# Patient Record
Sex: Female | Born: 1945 | Race: White | Hispanic: No | Smoking: Never smoker
Health system: Southern US, Community
[De-identification: ages and names within clinical notes are randomized; demographics above are authoritative.]

## PROBLEM LIST (undated history)

## (undated) DIAGNOSIS — I4891 Unspecified atrial fibrillation: Secondary | ICD-10-CM

## (undated) DIAGNOSIS — R1032 Left lower quadrant pain: Secondary | ICD-10-CM

## (undated) DIAGNOSIS — R0600 Dyspnea, unspecified: Secondary | ICD-10-CM

## (undated) DIAGNOSIS — I209 Angina pectoris, unspecified: Secondary | ICD-10-CM

## (undated) DIAGNOSIS — I10 Essential (primary) hypertension: Secondary | ICD-10-CM

## (undated) DIAGNOSIS — G4730 Sleep apnea, unspecified: Secondary | ICD-10-CM

## (undated) DIAGNOSIS — C884 Extranodal marginal zone B-cell lymphoma of mucosa-associated lymphoid tissue [MALT-lymphoma]: Secondary | ICD-10-CM

## (undated) DIAGNOSIS — E119 Type 2 diabetes mellitus without complications: Secondary | ICD-10-CM

## (undated) DIAGNOSIS — I499 Cardiac arrhythmia, unspecified: Secondary | ICD-10-CM

## (undated) DIAGNOSIS — F329 Major depressive disorder, single episode, unspecified: Secondary | ICD-10-CM

## (undated) DIAGNOSIS — E039 Hypothyroidism, unspecified: Secondary | ICD-10-CM

## (undated) DIAGNOSIS — J45909 Unspecified asthma, uncomplicated: Secondary | ICD-10-CM

## (undated) DIAGNOSIS — C50919 Malignant neoplasm of unspecified site of unspecified female breast: Secondary | ICD-10-CM

## (undated) DIAGNOSIS — I2510 Atherosclerotic heart disease of native coronary artery without angina pectoris: Secondary | ICD-10-CM

## (undated) DIAGNOSIS — F32A Depression, unspecified: Secondary | ICD-10-CM

## (undated) DIAGNOSIS — D61818 Other pancytopenia: Secondary | ICD-10-CM

## (undated) DIAGNOSIS — M1990 Unspecified osteoarthritis, unspecified site: Secondary | ICD-10-CM

## (undated) DIAGNOSIS — Z923 Personal history of irradiation: Secondary | ICD-10-CM

## (undated) DIAGNOSIS — Z789 Other specified health status: Secondary | ICD-10-CM

## (undated) DIAGNOSIS — K219 Gastro-esophageal reflux disease without esophagitis: Secondary | ICD-10-CM

## (undated) HISTORY — PX: BREAST BIOPSY: SHX20

## (undated) HISTORY — PX: CARDIAC CATHETERIZATION: SHX172

## (undated) HISTORY — PX: JOINT REPLACEMENT: SHX530

## (undated) HISTORY — PX: CHOLECYSTECTOMY: SHX55

## (undated) HISTORY — PX: HAMMER TOE SURGERY: SHX385

## (undated) HISTORY — PX: DILATION AND CURETTAGE OF UTERUS: SHX78

## (undated) HISTORY — PX: OTHER SURGICAL HISTORY: SHX169

## (undated) HISTORY — DX: Extranodal marginal zone B-cell lymphoma of mucosa-associated lymphoid tissue (MALT-lymphoma): C88.4

## (undated) MED FILL — Fosaprepitant Dimeglumine For IV Infusion 150 MG (Base Eq): INTRAVENOUS | Qty: 5 | Status: AC

## (undated) MED FILL — Dexamethasone Sodium Phosphate Inj 100 MG/10ML: INTRAMUSCULAR | Qty: 1 | Status: AC

---

## 2004-07-23 ENCOUNTER — Ambulatory Visit: Payer: Self-pay | Admitting: Internal Medicine

## 2005-06-17 ENCOUNTER — Ambulatory Visit: Payer: Self-pay | Admitting: Internal Medicine

## 2005-08-26 ENCOUNTER — Ambulatory Visit: Payer: Self-pay | Admitting: Internal Medicine

## 2006-01-08 DIAGNOSIS — I2510 Atherosclerotic heart disease of native coronary artery without angina pectoris: Secondary | ICD-10-CM | POA: Insufficient documentation

## 2006-01-18 ENCOUNTER — Ambulatory Visit: Payer: Self-pay | Admitting: Cardiology

## 2006-08-01 ENCOUNTER — Other Ambulatory Visit: Payer: Self-pay

## 2006-08-01 ENCOUNTER — Ambulatory Visit: Payer: Self-pay | Admitting: Unknown Physician Specialty

## 2006-08-11 ENCOUNTER — Inpatient Hospital Stay: Payer: Self-pay | Admitting: Unknown Physician Specialty

## 2006-10-25 ENCOUNTER — Ambulatory Visit: Payer: Self-pay | Admitting: Internal Medicine

## 2007-12-26 ENCOUNTER — Ambulatory Visit: Payer: Self-pay | Admitting: Internal Medicine

## 2008-12-27 ENCOUNTER — Ambulatory Visit: Payer: Self-pay | Admitting: Internal Medicine

## 2009-06-24 ENCOUNTER — Ambulatory Visit: Payer: Self-pay | Admitting: Internal Medicine

## 2009-12-29 ENCOUNTER — Ambulatory Visit: Payer: Self-pay | Admitting: Internal Medicine

## 2010-11-03 ENCOUNTER — Encounter (INDEPENDENT_AMBULATORY_CARE_PROVIDER_SITE_OTHER): Payer: BC Managed Care – PPO | Admitting: Ophthalmology

## 2010-11-03 ENCOUNTER — Encounter (INDEPENDENT_AMBULATORY_CARE_PROVIDER_SITE_OTHER): Payer: Medicare Other | Admitting: Ophthalmology

## 2010-11-03 DIAGNOSIS — H348392 Tributary (branch) retinal vein occlusion, unspecified eye, stable: Secondary | ICD-10-CM

## 2010-11-03 DIAGNOSIS — H35039 Hypertensive retinopathy, unspecified eye: Secondary | ICD-10-CM

## 2010-11-03 DIAGNOSIS — H43819 Vitreous degeneration, unspecified eye: Secondary | ICD-10-CM

## 2011-01-26 ENCOUNTER — Encounter (INDEPENDENT_AMBULATORY_CARE_PROVIDER_SITE_OTHER): Payer: BC Managed Care – PPO | Admitting: Ophthalmology

## 2011-01-26 DIAGNOSIS — I10 Essential (primary) hypertension: Secondary | ICD-10-CM

## 2011-01-26 DIAGNOSIS — H348392 Tributary (branch) retinal vein occlusion, unspecified eye, stable: Secondary | ICD-10-CM

## 2011-01-26 DIAGNOSIS — H43819 Vitreous degeneration, unspecified eye: Secondary | ICD-10-CM

## 2011-01-26 DIAGNOSIS — H35039 Hypertensive retinopathy, unspecified eye: Secondary | ICD-10-CM

## 2011-02-05 ENCOUNTER — Ambulatory Visit: Payer: Self-pay | Admitting: Internal Medicine

## 2011-02-13 ENCOUNTER — Ambulatory Visit: Payer: Self-pay | Admitting: Unknown Physician Specialty

## 2011-02-15 ENCOUNTER — Encounter (HOSPITAL_COMMUNITY): Payer: Self-pay | Admitting: Pharmacy Technician

## 2011-02-15 DIAGNOSIS — H33009 Unspecified retinal detachment with retinal break, unspecified eye: Secondary | ICD-10-CM

## 2011-02-15 NOTE — H&P (Addendum)
Stacey Stone is an 65 y.o. female.   Chief Complaint:Loss of vision and visual field left eye HPI: Loss of vision and visual field OS  Cardiac Blockage on ASA and plavix  Surgical history, Gall Bladder surgery, Right knee surgery  No family history on file. Social History:  does not have a smoking history on file. She does not have any smokeless tobacco history on file. Her alcohol and drug histories not on file.  Allergies:  Allergies  Allergen Reactions  . Codeine Other (See Comments)    Stroke like symptoms    No current facility-administered medications on file as of .   No current outpatient prescriptions on file as of .    Review of systems otherwise negative  There were no vitals taken for this visit.  Physical exam: Mental status: oriented x3. Eyes: See eye exam scanned in for this date and encounter Ears, Nose, Throat: within normal limits Neck: Within Normal limits General: within normal limits Chest: Within normal limits Breast: deferred Heart: Within normal limits Abdomen: Within normal limits GU: deferred Extremities: within normal limits Skin: within normal limits  Assessment/Plan Rhegmatogenous Retinal detachment Left eye  Plan: To [facility] for Scleral buckle, laser treatment, gas injection        Sherrie George 02/15/2011, 5:36 PM

## 2011-02-16 ENCOUNTER — Other Ambulatory Visit: Payer: Self-pay

## 2011-02-16 ENCOUNTER — Encounter (HOSPITAL_COMMUNITY)
Admission: RE | Disposition: A | Discharge status: Home / Self Care | Payer: Self-pay | Source: Ambulatory Visit | Attending: Ophthalmology

## 2011-02-16 ENCOUNTER — Encounter (HOSPITAL_COMMUNITY): Payer: Self-pay | Admitting: Certified Registered"

## 2011-02-16 ENCOUNTER — Encounter (HOSPITAL_COMMUNITY): Payer: Self-pay | Admitting: *Deleted

## 2011-02-16 ENCOUNTER — Encounter (INDEPENDENT_AMBULATORY_CARE_PROVIDER_SITE_OTHER): Payer: BC Managed Care – PPO | Admitting: Ophthalmology

## 2011-02-16 ENCOUNTER — Ambulatory Visit (HOSPITAL_COMMUNITY)
Admission: RE | Admit: 2011-02-16 | Discharge: 2011-02-17 | Disposition: A | Discharge status: Home / Self Care | Payer: BC Managed Care – PPO | Source: Ambulatory Visit | Attending: Ophthalmology | Admitting: Ophthalmology

## 2011-02-16 ENCOUNTER — Ambulatory Visit (HOSPITAL_COMMUNITY): Payer: BC Managed Care – PPO

## 2011-02-16 ENCOUNTER — Ambulatory Visit (HOSPITAL_COMMUNITY): Payer: BC Managed Care – PPO | Admitting: Certified Registered"

## 2011-02-16 DIAGNOSIS — H33002 Unspecified retinal detachment with retinal break, left eye: Secondary | ICD-10-CM

## 2011-02-16 DIAGNOSIS — I10 Essential (primary) hypertension: Secondary | ICD-10-CM | POA: Insufficient documentation

## 2011-02-16 DIAGNOSIS — E669 Obesity, unspecified: Secondary | ICD-10-CM | POA: Insufficient documentation

## 2011-02-16 DIAGNOSIS — G4730 Sleep apnea, unspecified: Secondary | ICD-10-CM | POA: Insufficient documentation

## 2011-02-16 DIAGNOSIS — H33009 Unspecified retinal detachment with retinal break, unspecified eye: Secondary | ICD-10-CM | POA: Insufficient documentation

## 2011-02-16 DIAGNOSIS — K219 Gastro-esophageal reflux disease without esophagitis: Secondary | ICD-10-CM | POA: Insufficient documentation

## 2011-02-16 DIAGNOSIS — H35039 Hypertensive retinopathy, unspecified eye: Secondary | ICD-10-CM

## 2011-02-16 DIAGNOSIS — H348392 Tributary (branch) retinal vein occlusion, unspecified eye, stable: Secondary | ICD-10-CM

## 2011-02-16 DIAGNOSIS — E039 Hypothyroidism, unspecified: Secondary | ICD-10-CM | POA: Insufficient documentation

## 2011-02-16 DIAGNOSIS — I2510 Atherosclerotic heart disease of native coronary artery without angina pectoris: Secondary | ICD-10-CM | POA: Insufficient documentation

## 2011-02-16 HISTORY — DX: Depression, unspecified: F32.A

## 2011-02-16 HISTORY — DX: Other specified health status: Z78.9

## 2011-02-16 HISTORY — DX: Major depressive disorder, single episode, unspecified: F32.9

## 2011-02-16 HISTORY — PX: SCLERAL BUCKLE: SHX5340

## 2011-02-16 HISTORY — DX: Unspecified osteoarthritis, unspecified site: M19.90

## 2011-02-16 HISTORY — DX: Hypothyroidism, unspecified: E03.9

## 2011-02-16 HISTORY — DX: Sleep apnea, unspecified: G47.30

## 2011-02-16 HISTORY — DX: Atherosclerotic heart disease of native coronary artery without angina pectoris: I25.10

## 2011-02-16 HISTORY — DX: Essential (primary) hypertension: I10

## 2011-02-16 HISTORY — DX: Gastro-esophageal reflux disease without esophagitis: K21.9

## 2011-02-16 SURGERY — SCLERAL BUCKLE
Anesthesia: General | Laterality: Left | Wound class: Clean

## 2011-02-16 MED ORDER — BSS IO SOLN
INTRAOCULAR | Status: DC | PRN
  Administered 2011-02-16: 15 mL via INTRAOCULAR

## 2011-02-16 MED ORDER — PHENYLEPHRINE HCL 2.5 % OP SOLN
OPHTHALMIC | Status: AC
  Administered 2011-02-16: 12:00:00
  Filled 2011-02-16: qty 3

## 2011-02-16 MED ORDER — SODIUM CHLORIDE 0.9 % IJ SOLN
INTRAMUSCULAR | Status: DC | PRN
  Administered 2011-02-16: 10 mL

## 2011-02-16 MED ORDER — OXYCODONE-ACETAMINOPHEN 5-325 MG PO TABS
1.0000 | ORAL_TABLET | ORAL | Status: DC | PRN
  Administered 2011-02-17: 2 via ORAL
  Filled 2011-02-16: qty 2

## 2011-02-16 MED ORDER — ATROPINE SULFATE 1 % OP SOLN
OPHTHALMIC | Status: DC | PRN
  Administered 2011-02-16: 1 [drp] via OPHTHALMIC

## 2011-02-16 MED ORDER — LATANOPROST 0.005 % OP SOLN
1.0000 [drp] | Freq: Every day | OPHTHALMIC | Status: DC
  Filled 2011-02-16: qty 2.5

## 2011-02-16 MED ORDER — DEXAMETHASONE SODIUM PHOSPHATE 10 MG/ML IJ SOLN
INTRAMUSCULAR | Status: DC | PRN
  Administered 2011-02-16: 10 mg

## 2011-02-16 MED ORDER — BISACODYL 5 MG PO TBEC: 5.0000 mg | DELAYED_RELEASE_TABLET | Freq: Every day | ORAL | Status: DC | PRN

## 2011-02-16 MED ORDER — GLYCOPYRROLATE 0.2 MG/ML IJ SOLN
INTRAMUSCULAR | Status: DC | PRN
  Administered 2011-02-16: 0.2 mg via INTRAVENOUS
  Administered 2011-02-16: .7 mg via INTRAVENOUS

## 2011-02-16 MED ORDER — FENTANYL CITRATE 0.05 MG/ML IJ SOLN
INTRAMUSCULAR | Status: DC | PRN
  Administered 2011-02-16 (×4): 50 ug via INTRAVENOUS

## 2011-02-16 MED ORDER — GATIFLOXACIN 0.5 % OP SOLN
1.0000 [drp] | Freq: Four times a day (QID) | OPHTHALMIC | Status: DC
  Filled 2011-02-16: qty 2.5

## 2011-02-16 MED ORDER — TEMAZEPAM 15 MG PO CAPS: 15.0000 mg | ORAL_CAPSULE | Freq: Every evening | ORAL | Status: DC | PRN

## 2011-02-16 MED ORDER — SODIUM CHLORIDE 0.9 % IV SOLN
INTRAVENOUS | Status: DC
  Administered 2011-02-16 (×2): via INTRAVENOUS

## 2011-02-16 MED ORDER — CEFAZOLIN SODIUM-DEXTROSE 2-3 GM-% IV SOLR
INTRAVENOUS | Status: AC
  Administered 2011-02-16: 2 g via INTRAVENOUS
  Filled 2011-02-16: qty 50

## 2011-02-16 MED ORDER — ONDANSETRON HCL 4 MG/2ML IJ SOLN: 4.0000 mg | Freq: Four times a day (QID) | INTRAMUSCULAR | Status: DC

## 2011-02-16 MED ORDER — MAGNESIUM HYDROXIDE 400 MG/5ML PO SUSP: 15.0000 mL | Freq: Four times a day (QID) | ORAL | Status: DC | PRN

## 2011-02-16 MED ORDER — MIDAZOLAM HCL 5 MG/5ML IJ SOLN
INTRAMUSCULAR | Status: DC | PRN
  Administered 2011-02-16: 2 mg via INTRAVENOUS

## 2011-02-16 MED ORDER — GATIFLOXACIN 0.5 % OP SOLN
OPHTHALMIC | Status: AC
  Administered 2011-02-16: 1 [drp] via OPHTHALMIC
  Filled 2011-02-16: qty 2.5

## 2011-02-16 MED ORDER — BUPIVACAINE HCL 0.75 % IJ SOLN
INTRAMUSCULAR | Status: DC | PRN
  Administered 2011-02-16: 10 mL

## 2011-02-16 MED ORDER — SODIUM CHLORIDE 0.45 % IV SOLN
INTRAVENOUS | Status: DC
  Administered 2011-02-16: 17:00:00 via INTRAVENOUS

## 2011-02-16 MED ORDER — GATIFLOXACIN 0.5 % OP SOLN
1.0000 [drp] | OPHTHALMIC | Status: AC
  Administered 2011-02-16 (×3): 1 [drp] via OPHTHALMIC

## 2011-02-16 MED ORDER — FENTANYL CITRATE 0.05 MG/ML IJ SOLN
25.0000 ug | INTRAMUSCULAR | Status: DC | PRN
  Administered 2011-02-16: 25 ug via INTRAVENOUS
  Administered 2011-02-16: 50 ug via INTRAVENOUS

## 2011-02-16 MED ORDER — ROCURONIUM BROMIDE 100 MG/10ML IV SOLN
INTRAVENOUS | Status: DC | PRN
  Administered 2011-02-16: 50 mg via INTRAVENOUS

## 2011-02-16 MED ORDER — LIDOCAINE HCL (CARDIAC) 20 MG/ML IV SOLN
INTRAVENOUS | Status: DC | PRN
  Administered 2011-02-16: 80 mg via INTRAVENOUS

## 2011-02-16 MED ORDER — BRIMONIDINE TARTRATE 0.2 % OP SOLN
1.0000 [drp] | Freq: Two times a day (BID) | OPHTHALMIC | Status: DC
  Filled 2011-02-16: qty 5

## 2011-02-16 MED ORDER — POLYMYXIN B SULFATE 500000 UNITS IJ SOLR
INTRAMUSCULAR | Status: DC | PRN
  Administered 2011-02-16: 500000 [IU]

## 2011-02-16 MED ORDER — ONDANSETRON HCL 4 MG/2ML IJ SOLN
INTRAMUSCULAR | Status: DC | PRN
  Administered 2011-02-16: 4 mg via INTRAVENOUS

## 2011-02-16 MED ORDER — MORPHINE SULFATE 2 MG/ML IJ SOLN
1.0000 mg | INTRAMUSCULAR | Status: DC | PRN
  Administered 2011-02-17: 2 mg via INTRAVENOUS
  Filled 2011-02-16: qty 1

## 2011-02-16 MED ORDER — BACITRACIN-POLYMYXIN B OP OINT
TOPICAL_OINTMENT | OPHTHALMIC | Status: DC | PRN
  Administered 2011-02-16: 1 via OPHTHALMIC

## 2011-02-16 MED ORDER — PHENYLEPHRINE HCL 2.5 % OP SOLN
1.0000 [drp] | OPHTHALMIC | Status: AC
  Administered 2011-02-16 (×3): 1 [drp] via OPHTHALMIC
  Filled 2011-02-16: qty 3

## 2011-02-16 MED ORDER — NEOSTIGMINE METHYLSULFATE 1 MG/ML IJ SOLN
INTRAMUSCULAR | Status: DC | PRN
  Administered 2011-02-16: 5 mg via INTRAVENOUS

## 2011-02-16 MED ORDER — PREDNISOLONE ACETATE 1 % OP SUSP
1.0000 [drp] | Freq: Four times a day (QID) | OPHTHALMIC | Status: DC
  Filled 2011-02-16: qty 1

## 2011-02-16 MED ORDER — EPHEDRINE SULFATE 50 MG/ML IJ SOLN
INTRAMUSCULAR | Status: DC | PRN
  Administered 2011-02-16: 10 mg via INTRAVENOUS

## 2011-02-16 MED ORDER — PROMETHAZINE HCL 25 MG/ML IJ SOLN: 6.2500 mg | INTRAMUSCULAR | Status: DC | PRN

## 2011-02-16 MED ORDER — VECURONIUM BROMIDE 10 MG IV SOLR
INTRAVENOUS | Status: DC | PRN
  Administered 2011-02-16 (×4): 1 mg via INTRAVENOUS

## 2011-02-16 MED ORDER — TROPICAMIDE 1 % OP SOLN
OPHTHALMIC | Status: AC
  Administered 2011-02-16: 09:00:00
  Filled 2011-02-16: qty 3

## 2011-02-16 MED ORDER — TROPICAMIDE 1 % OP SOLN
1.0000 [drp] | OPHTHALMIC | Status: AC
  Administered 2011-02-16 (×3): 1 [drp] via OPHTHALMIC

## 2011-02-16 MED ORDER — GENTAMICIN SULFATE 40 MG/ML IJ SOLN
INTRAMUSCULAR | Status: DC | PRN
  Administered 2011-02-16: 80 mg

## 2011-02-16 MED ORDER — MUPIROCIN 2 % EX OINT
TOPICAL_OINTMENT | CUTANEOUS | Status: AC
  Filled 2011-02-16: qty 22

## 2011-02-16 MED ORDER — CYCLOPENTOLATE HCL 1 % OP SOLN
OPHTHALMIC | Status: AC
  Administered 2011-02-16: 09:00:00
  Filled 2011-02-16: qty 2

## 2011-02-16 MED ORDER — CEFAZOLIN SODIUM 1-5 GM-% IV SOLN: 1.0000 g | INTRAVENOUS | Status: DC

## 2011-02-16 MED ORDER — PROPOFOL 10 MG/ML IV EMUL
INTRAVENOUS | Status: DC | PRN
  Administered 2011-02-16: 20 mg via INTRAVENOUS
  Administered 2011-02-16: 140 mg via INTRAVENOUS

## 2011-02-16 MED ORDER — TETRACAINE HCL 0.5 % OP SOLN
1.0000 [drp] | Freq: Once | OPHTHALMIC | Status: AC
  Administered 2011-02-17: 1 [drp] via OPHTHALMIC
  Filled 2011-02-16: qty 2

## 2011-02-16 MED ORDER — HEMOSTATIC AGENTS (NO CHARGE) OPTIME
TOPICAL | Status: DC | PRN
  Administered 2011-02-16: 1

## 2011-02-16 MED ORDER — ACETAZOLAMIDE SODIUM 500 MG IJ SOLR
500.0000 mg | Freq: Once | INTRAMUSCULAR | Status: AC
  Administered 2011-02-17: 500 mg via INTRAVENOUS
  Filled 2011-02-16: qty 500

## 2011-02-16 MED ORDER — METOPROLOL SUCCINATE ER 100 MG PO TB24
100.0000 mg | ORAL_TABLET | Freq: Once | ORAL | Status: AC
  Administered 2011-02-16: 100 mg via ORAL
  Filled 2011-02-16 (×2): qty 1

## 2011-02-16 MED ORDER — ACETAMINOPHEN 325 MG PO TABS
325.0000 mg | ORAL_TABLET | ORAL | Status: DC | PRN
  Administered 2011-02-17: 650 mg via ORAL
  Filled 2011-02-16: qty 2

## 2011-02-16 MED ORDER — LACTATED RINGERS IV SOLN: INTRAVENOUS | Status: DC

## 2011-02-16 MED ORDER — CYCLOPENTOLATE HCL 1 % OP SOLN
1.0000 [drp] | OPHTHALMIC | Status: AC
  Administered 2011-02-16 (×3): 1 [drp] via OPHTHALMIC

## 2011-02-16 SURGICAL SUPPLY — 70 items
APPLICATOR DR MATTHEWS STRL (MISCELLANEOUS) ×16 IMPLANT
BLADE EYE CATARACT 19 1.4 BEAV (BLADE) IMPLANT
BLADE MVR KNIFE 19G (BLADE) IMPLANT
BLADE SURG 15 STRL LF DISP TIS (BLADE) IMPLANT
BLADE SURG 15 STRL SS (BLADE)
CANNULA ANT CHAM MAIN (OPHTHALMIC RELATED) IMPLANT
CANNULA DUAL BORE 23G (CANNULA) IMPLANT
CORDS BIPOLAR (ELECTRODE) IMPLANT
COTTONBALL LRG STERILE PKG (GAUZE/BANDAGES/DRESSINGS) ×6 IMPLANT
COVER SURGICAL LIGHT HANDLE (MISCELLANEOUS) ×2 IMPLANT
DRAPE OPHTHALMIC 77X100 STRL (CUSTOM PROCEDURE TRAY) ×2 IMPLANT
ERASER HMR WETFIELD 23G BP (MISCELLANEOUS) IMPLANT
EYE SHIELD UNIVERSAL CLEAR (GAUZE/BANDAGES/DRESSINGS) ×2 IMPLANT
FILTER BLUE MILLIPORE (MISCELLANEOUS) ×4 IMPLANT
FILTER STRAW FLUID ASPIR (MISCELLANEOUS) IMPLANT
GAS OPHTHALMIC (MISCELLANEOUS) ×2 IMPLANT
GLOVE ECLIPSE 6.5 STRL STRAW (GLOVE) ×2 IMPLANT
GLOVE SS BIOGEL STRL SZ 6.5 (GLOVE) ×1 IMPLANT
GLOVE SS BIOGEL STRL SZ 7 (GLOVE) ×1 IMPLANT
GLOVE SUPERSENSE BIOGEL SZ 6.5 (GLOVE) ×1
GLOVE SUPERSENSE BIOGEL SZ 7 (GLOVE) ×1
GLOVE SURG 8.5 LATEX PF (GLOVE) ×2 IMPLANT
GOWN STRL NON-REIN LRG LVL3 (GOWN DISPOSABLE) ×4 IMPLANT
ILLUMINATOR CHOW PICK 25GA (MISCELLANEOUS) IMPLANT
IMPL SILICONE (Ophthalmic Related) ×1 IMPLANT
IMPLANT RETINOL SPONGE SILICON (Ophthalmic Related) ×2 IMPLANT
IMPLANT SILICONE (Ophthalmic Related) ×3 IMPLANT
KIT PERFLUORON PROCEDURE 5ML (MISCELLANEOUS) IMPLANT
KIT ROOM TURNOVER OR (KITS) ×2 IMPLANT
KNIFE CRESCENT 1.75 EDGEAHEAD (BLADE) IMPLANT
KNIFE GRIESHABER SHARP 2.5MM (MISCELLANEOUS) ×6 IMPLANT
MARKER SKIN DUAL TIP RULER LAB (MISCELLANEOUS) ×2 IMPLANT
NEEDLE 18GX1X1/2 (RX/OR ONLY) (NEEDLE) ×2 IMPLANT
NEEDLE 25GX 5/8IN NON SAFETY (NEEDLE) IMPLANT
NEEDLE 27GAX1X1/2 (NEEDLE) IMPLANT
NEEDLE HYPO 30X.5 LL (NEEDLE) ×4 IMPLANT
NS IRRIG 1000ML POUR BTL (IV SOLUTION) ×2 IMPLANT
PACK VITRECTOMY CUSTOM (CUSTOM PROCEDURE TRAY) IMPLANT
PAD ARMBOARD 7.5X6 YLW CONV (MISCELLANEOUS) ×2 IMPLANT
PAD EYE OVAL STERILE LF (GAUZE/BANDAGES/DRESSINGS) ×2 IMPLANT
PAK VITRECTOMY PIK 25 GA (OPHTHALMIC RELATED) IMPLANT
PROBE DIRECTIONAL LASER (MISCELLANEOUS) IMPLANT
REPL STRA BRUSH NEEDLE (NEEDLE) IMPLANT
RESERVOIR BACK FLUSH (MISCELLANEOUS) IMPLANT
ROLLS DENTAL (MISCELLANEOUS) ×4 IMPLANT
SET FLUID INJECTOR (SET/KITS/TRAYS/PACK) IMPLANT
SET VGFI TUBING 8065808002 (SET/KITS/TRAYS/PACK) IMPLANT
SPONGE SURGIFOAM ABS GEL 12-7 (HEMOSTASIS) ×2 IMPLANT
STOPCOCK 4 WAY LG BORE MALE ST (IV SETS) IMPLANT
SUT CHROMIC 7 0 TG140 8 (SUTURE) ×2 IMPLANT
SUT ETHILON 9 0 TG140 8 (SUTURE) IMPLANT
SUT MERSILENE 4 0 RV 2 (SUTURE) ×4 IMPLANT
SUT SILK 2 0 (SUTURE) ×1
SUT SILK 2-0 18XBRD TIE 12 (SUTURE) ×1 IMPLANT
SUT SILK 4 0 RB 1 (SUTURE) ×2 IMPLANT
SUT VICRYL 7 0 TG140 8 (SUTURE) IMPLANT
SYR 20CC LL (SYRINGE) ×2 IMPLANT
SYR 50ML LL SCALE MARK (SYRINGE) IMPLANT
SYR 5ML LL (SYRINGE) IMPLANT
SYR BULB 3OZ (MISCELLANEOUS) ×2 IMPLANT
SYR TB 1ML LUER SLIP (SYRINGE) IMPLANT
SYRINGE 10CC LL (SYRINGE) IMPLANT
TAPE SURG TRANSPORE 1 IN (GAUZE/BANDAGES/DRESSINGS) ×1 IMPLANT
TAPE SURGICAL TRANSPORE 1 IN (GAUZE/BANDAGES/DRESSINGS) ×1
TIRE RTNL 2.5XGRV CNCV 9X (Ophthalmic Related) ×1 IMPLANT
TOWEL OR 17X24 6PK STRL BLUE (TOWEL DISPOSABLE) ×6 IMPLANT
TUBING ART PRESS 12 MALE/MALE (MISCELLANEOUS) IMPLANT
VITREORETINAL VISCODISSEC (MISCELLANEOUS) IMPLANT
WATER STERILE IRR 1000ML POUR (IV SOLUTION) ×2 IMPLANT
WIPE INSTRUMENT VISIWIPE 73X73 (MISCELLANEOUS) ×2 IMPLANT

## 2011-02-16 NOTE — Anesthesia Postprocedure Evaluation (Signed)
Anesthesia Post Note  Patient: Stacey Stone  Procedure(s) Performed:  SCLERAL BUCKLE - Scleral Buckle Left Eye with Headscope Laser  Anesthesia type: General  Patient location: PACU  Post pain: Pain level controlled  Post assessment: Patient's Cardiovascular Status Stable  Last Vitals:  Filed Vitals:   02/16/11 1612  BP:   Pulse: 76  Temp:   Resp: 28    Post vital signs: Reviewed and stable  Level of consciousness: sedated  Complications: No apparent anesthesia complications

## 2011-02-16 NOTE — Transfer of Care (Signed)
Immediate Anesthesia Transfer of Care Note  Patient: Stacey Stone  Procedure(s) Performed:  SCLERAL BUCKLE - Scleral Buckle Left Eye with Headscope Laser  Patient Location: PACU  Anesthesia Type: General  Level of Consciousness: lethargic and responds to stimulation  Airway & Oxygen Therapy: Patient Spontanous Breathing and Patient connected to face mask oxygen  Post-op Assessment: Report given to PACU RN  Post vital signs: Reviewed and stable  Complications: No apparent anesthesia complications

## 2011-02-16 NOTE — Anesthesia Preprocedure Evaluation (Addendum)
Anesthesia Evaluation  Patient identified by MRN, date of birth, ID band Patient awake    Reviewed: Allergy & Precautions, H&P , NPO status   Airway Mallampati: II TM Distance: >3 FB Neck ROM: Full    Dental  (+) Teeth Intact, Chipped, Dental Advisory Given, Partial Upper and Partial Lower,    Pulmonary sleep apnea ,    Pulmonary exam normal       Cardiovascular hypertension, Pt. on medications + CAD     Neuro/Psych PSYCHIATRIC DISORDERS Depression  Neuromuscular disease    GI/Hepatic hiatal hernia, GERD-  Medicated,  Endo/Other  Hypothyroidism   Renal/GU      Musculoskeletal   Abdominal (+) obese,   Peds  Hematology   Anesthesia Other Findings   Reproductive/Obstetrics                          Anesthesia Physical Anesthesia Plan  ASA: III  Anesthesia Plan: General   Post-op Pain Management:    Induction: Intravenous  Airway Management Planned: Oral ETT  Additional Equipment:   Intra-op Plan:   Post-operative Plan: Extubation in OR  Informed Consent: I have reviewed the patients History and Physical, chart, labs and discussed the procedure including the risks, benefits and alternatives for the proposed anesthesia with the patient or authorized representative who has indicated his/her understanding and acceptance.     Plan Discussed with: Surgeon and CRNA  Anesthesia Plan Comments:         Anesthesia Quick Evaluation

## 2011-02-16 NOTE — H&P (Signed)
I examined the patient and there is no change in her medical status

## 2011-02-16 NOTE — Anesthesia Procedure Notes (Addendum)
Procedure Name: Intubation Date/Time: 02/16/2011 1:35 PM Performed by: Jefm Miles E Pre-anesthesia Checklist: Timeout performed, Patient identified, Emergency Drugs available, Suction available and Patient being monitored Patient Re-evaluated:Patient Re-evaluated prior to inductionOxygen Delivery Method: Circle System Utilized Preoxygenation: Pre-oxygenation with 100% oxygen Intubation Type: IV induction Ventilation: Two handed mask ventilation required Laryngoscope Size: Mac and 4 Grade View: Grade III Tube type: Oral Number of attempts: 1 Airway Equipment and Method: stylet Placement Confirmation: ETT inserted through vocal cords under direct vision,  breath sounds checked- equal and bilateral,  positive ETCO2 and CO2 detector Secured at: 22 cm Tube secured with: Tape Dental Injury: Teeth and Oropharynx as per pre-operative assessment  Comments: Dr Gypsy Balsam performed the intubation

## 2011-02-16 NOTE — Preoperative (Signed)
Beta Blockers   Reason not to administer Beta Blockers:Not Applicable 

## 2011-02-16 NOTE — Brief Op Note (Signed)
Brief Operative note   Preoperative diagnosis:  Rhegmatogenous Retinal Detachment Left Eye Postoperative diagnosis  Rhegmatogenous Retinal Detachment Left Eye   Procedures: Scleral Buckle with Laser Photocoagulation Left eye  Surgeon:  Sherrie George, MD...  Assistant:  Rosalie Doctor SA    Anesthesia: General  Specimen: none  Estimated blood loss:  1cc  Complications: none  Patient sent to PACU in good condition  Composed by Sherrie George MD  Dictation number: [id number]

## 2011-02-16 NOTE — Op Note (Signed)
NAMEBRITTAIN, Stacey Stone          ACCOUNT NO.:  [id number]  MEDICAL RECORD NO.:  [id number]  LOCATION:  5126                         FACILITY:  [facility]  PHYSICIAN:  Beulah Gandy. Ashley Royalty, M.D. DATE OF BIRTH:  [birth year]  DATE OF PROCEDURE:  02/16/2011 DATE OF DISCHARGE:                              OPERATIVE REPORT   ADMISSION DIAGNOSIS:  Rhegmatogenous retinal detachment, left eye.  PROCEDURE:  Scleral buckle, left eye.  Retinal photocoagulation, left eye.  SURGEON:  Beulah Gandy. Ashley Royalty, MD  ASSISTANT:  Rosalie Doctor SA.  ANESTHESIA:  General.  DETAILS:  Usual prep and drape, 360-degree limbal peritomy, isolation of 4 rectus muscles on 2-0 silk, scleral dissection for 300 degrees to admit a number 279 intrascleral implant.  The buckle went from 7 o'clock to 10 o'clock.  Additional posterior dissection beneath the break at 2 o'clock.  Diathermy placed in the bed.  Two sutures per quadrant for total of 8 scleral sutures were placed in the scleral flaps. Perforation site was chosen at 5 o'clock in the posterior aspect of the bed.  The buckle was placed and trimmed.  A 279 implant was placed around the eye and a 240 band was placed around the eye and a 270 sleeve was jointed and was used to connect the band at 10 o'clock.  Perforation site chosen at 5 o'clock in the posterior aspect of the bed.  A moderate amount of clear subretinal fluid came forth in a controlled manner. Once the fluid egressed, the buckle elements were placed and the scleral flaps were closed.  The buckle was trimmed and the band was trimmed. Scleral flaps were closed and pigment came from the sclerotomy point as well.  Scleral sutures were drawn securely, knotted, and the free ends removed.  A 508G radial segment had been placed beneath the break at 2 o'clock.  Indirect ophthalmoscopy showed the retina to be lying nicely in place on the scleral buckle.  The indirect ophthalmoscope laser was moved into place, 1247  burns were placed around the retinal break and around the retinal periphery on the scleral buckle.  The power was between 300 to 100 mW 1000 microns each and 0.1 seconds each.  The buckle was adjusted and trimmed.  The band was adjusted and trimmed. The scleral sutures were knotted and the free ends removed.  The conjunctiva was reposited with 7-0 chromic suture.  Paracentesis x2 at 2 o'clock.  Obtained a closing pressure of 15 with a Barraquer tonometer. The conjunctiva was reposited with 7-0 chromic suture.  Polymyxin and gentamicin were irrigated into tenon space.  Atropine solution was applied.  Marcaine was injected around the globe for postop pain. Decadron 10 mg was injected into the lower subconjunctival space. Closing pressure was 15 with a Barraquer tonometer.  Polysporin patch and shield were placed.  The patient was awakened and taken to recovery in satisfactory condition.  COMPLICATIONS:  None.  DURATION:  2 hours.     Beulah Gandy. Ashley Royalty, M.D.     JDM/MEDQ  D:  02/16/2011  T:  02/16/2011  Job:  [id number]

## 2011-02-16 NOTE — Progress Notes (Signed)
Allyson reviewed, no new orders.

## 2011-02-17 ENCOUNTER — Encounter (HOSPITAL_COMMUNITY): Payer: Self-pay | Admitting: Ophthalmology

## 2011-02-17 MED ORDER — GATIFLOXACIN 0.5 % OP SOLN: 1.0000 [drp] | Freq: Four times a day (QID) | OPHTHALMIC | Status: DC

## 2011-02-17 MED ORDER — PREDNISOLONE ACETATE 1 % OP SUSP: 1.0000 [drp] | Freq: Four times a day (QID) | OPHTHALMIC | Status: AC

## 2011-02-17 MED ORDER — BACITRACIN-POLYMYXIN B 500-10000 UNIT/GM OP OINT
TOPICAL_OINTMENT | Freq: Three times a day (TID) | OPHTHALMIC | Status: DC
  Filled 2011-02-17: qty 3.5

## 2011-02-17 MED ORDER — BACITRACIN-POLYMYXIN B 500-10000 UNIT/GM OP OINT: TOPICAL_OINTMENT | Freq: Two times a day (BID) | OPHTHALMIC | Status: AC

## 2011-02-17 MED ORDER — HYDROCODONE-ACETAMINOPHEN 5-500 MG PO TABS: 1.0000 | ORAL_TABLET | Freq: Four times a day (QID) | ORAL | Status: AC | PRN

## 2011-02-17 NOTE — Progress Notes (Signed)
02/17/2011, 6:52 AM  Mental Status:  Awake, Alert, Oriented  Anterior segment: Cornea  Clear    Anterior Chamber Clear    Lens:   Cataract  Intra Ocular Pressure 20 mmHg with Tonopen  Vitreous: Clear   Retina:  Attached Good laser reaction   Impression: Excellent result Retina Attached  Final Diagnosis: Principal Problem:  *Rhegmatogenous retinal detachment of left eye   Plan: start post operative eye drops.  Discharge to home.  Give post operative instructions  Sherrie George 02/17/2011, 6:52 AM

## 2011-02-17 NOTE — Discharge Summary (Signed)
Not needed OWER patient see med records

## 2011-02-23 ENCOUNTER — Inpatient Hospital Stay (INDEPENDENT_AMBULATORY_CARE_PROVIDER_SITE_OTHER): Payer: BC Managed Care – PPO | Admitting: Ophthalmology

## 2011-02-23 DIAGNOSIS — H33009 Unspecified retinal detachment with retinal break, unspecified eye: Secondary | ICD-10-CM

## 2011-03-22 ENCOUNTER — Encounter (INDEPENDENT_AMBULATORY_CARE_PROVIDER_SITE_OTHER): Payer: BC Managed Care – PPO | Admitting: Ophthalmology

## 2011-03-22 DIAGNOSIS — H33009 Unspecified retinal detachment with retinal break, unspecified eye: Secondary | ICD-10-CM

## 2011-05-04 ENCOUNTER — Encounter (INDEPENDENT_AMBULATORY_CARE_PROVIDER_SITE_OTHER): Payer: BC Managed Care – PPO | Admitting: Ophthalmology

## 2011-05-04 DIAGNOSIS — H35039 Hypertensive retinopathy, unspecified eye: Secondary | ICD-10-CM

## 2011-05-04 DIAGNOSIS — I10 Essential (primary) hypertension: Secondary | ICD-10-CM

## 2011-05-04 DIAGNOSIS — H35379 Puckering of macula, unspecified eye: Secondary | ICD-10-CM

## 2011-05-04 DIAGNOSIS — H3530 Unspecified macular degeneration: Secondary | ICD-10-CM

## 2011-05-04 DIAGNOSIS — H43819 Vitreous degeneration, unspecified eye: Secondary | ICD-10-CM

## 2011-05-04 DIAGNOSIS — H348392 Tributary (branch) retinal vein occlusion, unspecified eye, stable: Secondary | ICD-10-CM

## 2011-05-04 DIAGNOSIS — H33009 Unspecified retinal detachment with retinal break, unspecified eye: Secondary | ICD-10-CM

## 2011-05-11 ENCOUNTER — Encounter (INDEPENDENT_AMBULATORY_CARE_PROVIDER_SITE_OTHER): Payer: BC Managed Care – PPO | Admitting: Ophthalmology

## 2011-05-12 ENCOUNTER — Ambulatory Visit: Payer: Self-pay | Admitting: Unknown Physician Specialty

## 2011-08-31 ENCOUNTER — Ambulatory Visit (INDEPENDENT_AMBULATORY_CARE_PROVIDER_SITE_OTHER): Payer: BC Managed Care – PPO | Admitting: Ophthalmology

## 2011-08-31 DIAGNOSIS — H43819 Vitreous degeneration, unspecified eye: Secondary | ICD-10-CM

## 2011-08-31 DIAGNOSIS — H35379 Puckering of macula, unspecified eye: Secondary | ICD-10-CM

## 2011-08-31 DIAGNOSIS — H35039 Hypertensive retinopathy, unspecified eye: Secondary | ICD-10-CM

## 2011-08-31 DIAGNOSIS — H348392 Tributary (branch) retinal vein occlusion, unspecified eye, stable: Secondary | ICD-10-CM

## 2011-08-31 DIAGNOSIS — H3530 Unspecified macular degeneration: Secondary | ICD-10-CM

## 2011-08-31 DIAGNOSIS — H33009 Unspecified retinal detachment with retinal break, unspecified eye: Secondary | ICD-10-CM

## 2012-02-08 ENCOUNTER — Ambulatory Visit: Payer: Self-pay | Admitting: Internal Medicine

## 2012-02-09 ENCOUNTER — Ambulatory Visit: Payer: Self-pay | Admitting: Internal Medicine

## 2012-08-30 ENCOUNTER — Ambulatory Visit (INDEPENDENT_AMBULATORY_CARE_PROVIDER_SITE_OTHER): Payer: BC Managed Care – PPO | Admitting: Ophthalmology

## 2012-08-30 DIAGNOSIS — H348392 Tributary (branch) retinal vein occlusion, unspecified eye, stable: Secondary | ICD-10-CM

## 2012-08-30 DIAGNOSIS — H3530 Unspecified macular degeneration: Secondary | ICD-10-CM

## 2012-08-30 DIAGNOSIS — H35379 Puckering of macula, unspecified eye: Secondary | ICD-10-CM

## 2012-08-30 DIAGNOSIS — H35039 Hypertensive retinopathy, unspecified eye: Secondary | ICD-10-CM

## 2012-08-30 DIAGNOSIS — H33009 Unspecified retinal detachment with retinal break, unspecified eye: Secondary | ICD-10-CM

## 2012-08-30 DIAGNOSIS — I10 Essential (primary) hypertension: Secondary | ICD-10-CM

## 2012-08-30 DIAGNOSIS — H35359 Cystoid macular degeneration, unspecified eye: Secondary | ICD-10-CM

## 2012-08-30 DIAGNOSIS — H43819 Vitreous degeneration, unspecified eye: Secondary | ICD-10-CM

## 2012-10-11 ENCOUNTER — Encounter (INDEPENDENT_AMBULATORY_CARE_PROVIDER_SITE_OTHER): Payer: BC Managed Care – PPO | Admitting: Ophthalmology

## 2012-10-11 DIAGNOSIS — H35359 Cystoid macular degeneration, unspecified eye: Secondary | ICD-10-CM

## 2012-10-11 DIAGNOSIS — H35379 Puckering of macula, unspecified eye: Secondary | ICD-10-CM

## 2012-10-11 DIAGNOSIS — H35039 Hypertensive retinopathy, unspecified eye: Secondary | ICD-10-CM

## 2012-10-11 DIAGNOSIS — I10 Essential (primary) hypertension: Secondary | ICD-10-CM

## 2012-10-11 DIAGNOSIS — H348392 Tributary (branch) retinal vein occlusion, unspecified eye, stable: Secondary | ICD-10-CM

## 2012-11-21 ENCOUNTER — Ambulatory Visit: Payer: Self-pay | Admitting: Obstetrics and Gynecology

## 2012-11-21 LAB — BASIC METABOLIC PANEL
Anion Gap: 7 (ref 7–16)
BUN: 12 mg/dL (ref 7–18)
Calcium, Total: 9.1 mg/dL (ref 8.5–10.1)
Co2: 27 mmol/L (ref 21–32)
EGFR (African American): >60
EGFR (Non-African Amer.): 53 — ABNORMAL LOW
Osmolality: 277 (ref 275–301)
Potassium: 3.5 mmol/L (ref 3.5–5.1)

## 2012-11-24 ENCOUNTER — Ambulatory Visit: Payer: Self-pay | Admitting: Obstetrics and Gynecology

## 2013-01-11 ENCOUNTER — Encounter (INDEPENDENT_AMBULATORY_CARE_PROVIDER_SITE_OTHER): Payer: BC Managed Care – PPO | Admitting: Ophthalmology

## 2013-01-17 ENCOUNTER — Encounter (INDEPENDENT_AMBULATORY_CARE_PROVIDER_SITE_OTHER): Payer: BC Managed Care – PPO | Admitting: Ophthalmology

## 2013-01-26 ENCOUNTER — Encounter (INDEPENDENT_AMBULATORY_CARE_PROVIDER_SITE_OTHER): Payer: BC Managed Care – PPO | Admitting: Ophthalmology

## 2013-01-26 DIAGNOSIS — H35039 Hypertensive retinopathy, unspecified eye: Secondary | ICD-10-CM

## 2013-01-26 DIAGNOSIS — H43819 Vitreous degeneration, unspecified eye: Secondary | ICD-10-CM

## 2013-01-26 DIAGNOSIS — I10 Essential (primary) hypertension: Secondary | ICD-10-CM

## 2013-01-26 DIAGNOSIS — H348392 Tributary (branch) retinal vein occlusion, unspecified eye, stable: Secondary | ICD-10-CM

## 2013-01-26 DIAGNOSIS — H35359 Cystoid macular degeneration, unspecified eye: Secondary | ICD-10-CM

## 2013-01-26 DIAGNOSIS — H33009 Unspecified retinal detachment with retinal break, unspecified eye: Secondary | ICD-10-CM

## 2013-02-08 ENCOUNTER — Ambulatory Visit: Payer: Self-pay | Admitting: Internal Medicine

## 2013-02-13 ENCOUNTER — Ambulatory Visit: Payer: Self-pay | Admitting: Internal Medicine

## 2013-03-01 ENCOUNTER — Ambulatory Visit: Payer: Self-pay | Admitting: Gastroenterology

## 2013-03-02 LAB — PATHOLOGY REPORT

## 2013-05-31 ENCOUNTER — Ambulatory Visit (INDEPENDENT_AMBULATORY_CARE_PROVIDER_SITE_OTHER): Payer: BC Managed Care – PPO | Admitting: Ophthalmology

## 2013-05-31 DIAGNOSIS — H348392 Tributary (branch) retinal vein occlusion, unspecified eye, stable: Secondary | ICD-10-CM

## 2013-05-31 DIAGNOSIS — H35359 Cystoid macular degeneration, unspecified eye: Secondary | ICD-10-CM

## 2013-05-31 DIAGNOSIS — I10 Essential (primary) hypertension: Secondary | ICD-10-CM

## 2013-05-31 DIAGNOSIS — H35039 Hypertensive retinopathy, unspecified eye: Secondary | ICD-10-CM

## 2013-05-31 DIAGNOSIS — H43819 Vitreous degeneration, unspecified eye: Secondary | ICD-10-CM

## 2013-05-31 DIAGNOSIS — H33009 Unspecified retinal detachment with retinal break, unspecified eye: Secondary | ICD-10-CM

## 2013-06-16 DIAGNOSIS — I208 Other forms of angina pectoris: Secondary | ICD-10-CM | POA: Insufficient documentation

## 2013-06-16 DIAGNOSIS — E039 Hypothyroidism, unspecified: Secondary | ICD-10-CM | POA: Insufficient documentation

## 2013-06-16 DIAGNOSIS — I2089 Other forms of angina pectoris: Secondary | ICD-10-CM | POA: Insufficient documentation

## 2013-06-16 DIAGNOSIS — E7800 Pure hypercholesterolemia, unspecified: Secondary | ICD-10-CM | POA: Insufficient documentation

## 2013-06-16 DIAGNOSIS — I10 Essential (primary) hypertension: Secondary | ICD-10-CM | POA: Insufficient documentation

## 2013-08-15 ENCOUNTER — Ambulatory Visit: Payer: Self-pay | Admitting: Internal Medicine

## 2013-10-03 ENCOUNTER — Ambulatory Visit: Payer: Self-pay | Admitting: Unknown Physician Specialty

## 2013-12-05 ENCOUNTER — Ambulatory Visit (INDEPENDENT_AMBULATORY_CARE_PROVIDER_SITE_OTHER): Payer: BC Managed Care – PPO | Admitting: Ophthalmology

## 2013-12-05 DIAGNOSIS — H33009 Unspecified retinal detachment with retinal break, unspecified eye: Secondary | ICD-10-CM

## 2013-12-05 DIAGNOSIS — H35379 Puckering of macula, unspecified eye: Secondary | ICD-10-CM

## 2013-12-05 DIAGNOSIS — H348392 Tributary (branch) retinal vein occlusion, unspecified eye, stable: Secondary | ICD-10-CM

## 2013-12-05 DIAGNOSIS — H35039 Hypertensive retinopathy, unspecified eye: Secondary | ICD-10-CM

## 2013-12-05 DIAGNOSIS — I10 Essential (primary) hypertension: Secondary | ICD-10-CM

## 2013-12-05 DIAGNOSIS — H35359 Cystoid macular degeneration, unspecified eye: Secondary | ICD-10-CM

## 2013-12-05 DIAGNOSIS — H43819 Vitreous degeneration, unspecified eye: Secondary | ICD-10-CM

## 2014-01-02 DIAGNOSIS — Z9989 Dependence on other enabling machines and devices: Secondary | ICD-10-CM

## 2014-01-02 DIAGNOSIS — G4733 Obstructive sleep apnea (adult) (pediatric): Secondary | ICD-10-CM | POA: Insufficient documentation

## 2014-01-23 ENCOUNTER — Ambulatory Visit: Payer: Self-pay | Admitting: Unknown Physician Specialty

## 2014-01-23 LAB — CBC WITH DIFFERENTIAL/PLATELET
BASOS ABS: 0.1 10*3/uL (ref 0.0–0.1)
BASOS PCT: 0.9 %
Eosinophil #: 0.3 10*3/uL (ref 0.0–0.7)
Eosinophil %: 3.3 %
HCT: 39.8 % (ref 35.0–47.0)
HGB: 13 g/dL (ref 12.0–16.0)
LYMPHS PCT: 31.2 %
Lymphocyte #: 2.5 10*3/uL (ref 1.0–3.6)
MCH: 26.9 pg (ref 26.0–34.0)
MCHC: 32.8 g/dL (ref 32.0–36.0)
MCV: 82 fL (ref 80–100)
MONO ABS: 0.6 x10 3/mm (ref 0.2–0.9)
MONOS PCT: 7 %
NEUTROS ABS: 4.6 10*3/uL (ref 1.4–6.5)
NEUTROS PCT: 57.6 %
Platelet: 228 10*3/uL (ref 150–440)
RBC: 4.84 10*6/uL (ref 3.80–5.20)
RDW: 15.5 % — AB (ref 11.5–14.5)
WBC: 8 10*3/uL (ref 3.6–11.0)

## 2014-01-23 LAB — APTT: Activated PTT: 25.8 secs (ref 23.6–35.9)

## 2014-01-23 LAB — PROTIME-INR
INR: 1
Prothrombin Time: 12.6 secs (ref 11.5–14.7)

## 2014-02-05 ENCOUNTER — Ambulatory Visit: Payer: Self-pay | Admitting: Internal Medicine

## 2014-02-05 LAB — CBC WITH DIFFERENTIAL/PLATELET
BASOS PCT: 0.4 %
Basophil #: 0 10*3/uL (ref 0.0–0.1)
EOS ABS: 0.1 10*3/uL (ref 0.0–0.7)
EOS PCT: 0.5 %
HCT: 39.6 % (ref 35.0–47.0)
HGB: 12.8 g/dL (ref 12.0–16.0)
LYMPHS ABS: 1 10*3/uL (ref 1.0–3.6)
Lymphocyte %: 8.6 %
MCH: 26.6 pg (ref 26.0–34.0)
MCHC: 32.4 g/dL (ref 32.0–36.0)
MCV: 82 fL (ref 80–100)
Monocyte #: 0.2 x10 3/mm (ref 0.2–0.9)
Monocyte %: 2 %
NEUTROS ABS: 10.3 10*3/uL — AB (ref 1.4–6.5)
Neutrophil %: 88.5 %
PLATELETS: 207 10*3/uL (ref 150–440)
RBC: 4.81 10*6/uL (ref 3.80–5.20)
RDW: 15.5 % — AB (ref 11.5–14.5)
WBC: 11.7 10*3/uL — AB (ref 3.6–11.0)

## 2014-02-05 LAB — TROPONIN I: Troponin-I: <0.02 ng/mL

## 2014-02-05 LAB — BASIC METABOLIC PANEL
Anion Gap: 7 (ref 7–16)
BUN: 12 mg/dL (ref 7–18)
CO2: 27 mmol/L (ref 21–32)
CREATININE: 1.3 mg/dL (ref 0.60–1.30)
Calcium, Total: 8.9 mg/dL (ref 8.5–10.1)
Chloride: 104 mmol/L (ref 98–107)
EGFR (African American): 52 — ABNORMAL LOW
GFR CALC NON AF AMER: 43 — AB
Glucose: 141 mg/dL — ABNORMAL HIGH (ref 65–99)
OSMOLALITY: 278 (ref 275–301)
Potassium: 3.7 mmol/L (ref 3.5–5.1)
Sodium: 138 mmol/L (ref 136–145)

## 2014-02-05 LAB — CK TOTAL AND CKMB (NOT AT ARMC): CK, TOTAL: 45 U/L

## 2014-02-05 LAB — D-DIMER(ARMC): D-DIMER: 782 ng/mL

## 2014-02-06 LAB — CBC WITH DIFFERENTIAL/PLATELET
BASOS ABS: 0 10*3/uL (ref 0.0–0.1)
BASOS PCT: 0.1 %
Eosinophil #: 0 10*3/uL (ref 0.0–0.7)
Eosinophil %: 0 %
HCT: 36.9 % (ref 35.0–47.0)
HGB: 11.8 g/dL — ABNORMAL LOW (ref 12.0–16.0)
LYMPHS PCT: 11.8 %
Lymphocyte #: 1.2 10*3/uL (ref 1.0–3.6)
MCH: 26.5 pg (ref 26.0–34.0)
MCHC: 31.8 g/dL — AB (ref 32.0–36.0)
MCV: 83 fL (ref 80–100)
MONO ABS: 0.4 x10 3/mm (ref 0.2–0.9)
MONOS PCT: 4 %
NEUTROS PCT: 84.1 %
Neutrophil #: 8.5 10*3/uL — ABNORMAL HIGH (ref 1.4–6.5)
PLATELETS: 211 10*3/uL (ref 150–440)
RBC: 4.44 10*6/uL (ref 3.80–5.20)
RDW: 15.5 % — AB (ref 11.5–14.5)
WBC: 10.1 10*3/uL (ref 3.6–11.0)

## 2014-02-11 ENCOUNTER — Ambulatory Visit: Payer: Self-pay | Admitting: Internal Medicine

## 2014-03-05 ENCOUNTER — Ambulatory Visit: Payer: Self-pay | Admitting: Oncology

## 2014-03-05 LAB — COMPREHENSIVE METABOLIC PANEL
Albumin: 3.8 g/dL (ref 3.4–5.0)
Alkaline Phosphatase: 183 U/L — ABNORMAL HIGH
Anion Gap: 10 (ref 7–16)
BUN: 11 mg/dL (ref 7–18)
Bilirubin,Total: 0.4 mg/dL (ref 0.2–1.0)
CHLORIDE: 103 mmol/L (ref 98–107)
Calcium, Total: 9.1 mg/dL (ref 8.5–10.1)
Co2: 30 mmol/L (ref 21–32)
Creatinine: 1.11 mg/dL (ref 0.60–1.30)
GFR CALC NON AF AMER: 52 — AB
Glucose: 111 mg/dL — ABNORMAL HIGH (ref 65–99)
OSMOLALITY: 285 (ref 275–301)
Potassium: 3.7 mmol/L (ref 3.5–5.1)
SGOT(AST): 23 U/L (ref 15–37)
SGPT (ALT): 32 U/L
Sodium: 143 mmol/L (ref 136–145)
Total Protein: 7.8 g/dL (ref 6.4–8.2)

## 2014-03-05 LAB — CBC CANCER CENTER
BASOS ABS: 0.1 x10 3/mm (ref 0.0–0.1)
BASOS PCT: 0.9 %
EOS ABS: 0.2 x10 3/mm (ref 0.0–0.7)
Eosinophil %: 2.9 %
HCT: 41.3 % (ref 35.0–47.0)
HGB: 13.4 g/dL (ref 12.0–16.0)
LYMPHS ABS: 2.3 x10 3/mm (ref 1.0–3.6)
Lymphocyte %: 30.7 %
MCH: 26.2 pg (ref 26.0–34.0)
MCHC: 32.5 g/dL (ref 32.0–36.0)
MCV: 81 fL (ref 80–100)
MONOS PCT: 6.5 %
Monocyte #: 0.5 x10 3/mm (ref 0.2–0.9)
NEUTROS PCT: 59 %
Neutrophil #: 4.5 x10 3/mm (ref 1.4–6.5)
Platelet: 228 x10 3/mm (ref 150–440)
RBC: 5.13 10*6/uL (ref 3.80–5.20)
RDW: 16.1 % — ABNORMAL HIGH (ref 11.5–14.5)
WBC: 7.6 x10 3/mm (ref 3.6–11.0)

## 2014-03-05 LAB — SEDIMENTATION RATE: ERYTHROCYTE SED RATE: 28 mm/h (ref 0–30)

## 2014-03-05 LAB — LACTATE DEHYDROGENASE: LDH: 183 U/L (ref 81–246)

## 2014-03-11 ENCOUNTER — Ambulatory Visit: Payer: Self-pay | Admitting: Oncology

## 2014-03-22 ENCOUNTER — Ambulatory Visit: Payer: Self-pay | Admitting: Oncology

## 2014-03-25 ENCOUNTER — Ambulatory Visit: Payer: Self-pay | Admitting: Oncology

## 2014-03-25 LAB — CBC WITH DIFFERENTIAL/PLATELET
Basophil: 1 %
COMMENT - H1-COM1: NORMAL
EOS PCT: 2 %
HCT: 40 % (ref 35.0–47.0)
HGB: 12.9 g/dL (ref 12.0–16.0)
Lymphocytes: 37 %
MCH: 26.4 pg (ref 26.0–34.0)
MCHC: 32.2 g/dL (ref 32.0–36.0)
MCV: 82 fL (ref 80–100)
MONOS PCT: 5 %
PLATELETS: 201 10*3/uL (ref 150–440)
RBC: 4.88 10*6/uL (ref 3.80–5.20)
RDW: 15.5 % — AB (ref 11.5–14.5)
Segmented Neutrophils: 50 %
Variant Lymphocyte - H1-Rlymph: 5 %
WBC: 8.3 10*3/uL (ref 3.6–11.0)

## 2014-04-22 ENCOUNTER — Ambulatory Visit: Payer: Self-pay | Admitting: Oncology

## 2014-06-05 ENCOUNTER — Ambulatory Visit (INDEPENDENT_AMBULATORY_CARE_PROVIDER_SITE_OTHER): Payer: BLUE CROSS/BLUE SHIELD | Admitting: Ophthalmology

## 2014-06-05 DIAGNOSIS — H34821 Venous engorgement, right eye: Secondary | ICD-10-CM

## 2014-06-05 DIAGNOSIS — H35033 Hypertensive retinopathy, bilateral: Secondary | ICD-10-CM

## 2014-06-05 DIAGNOSIS — H35372 Puckering of macula, left eye: Secondary | ICD-10-CM

## 2014-06-05 DIAGNOSIS — I10 Essential (primary) hypertension: Secondary | ICD-10-CM

## 2014-06-05 DIAGNOSIS — H43813 Vitreous degeneration, bilateral: Secondary | ICD-10-CM | POA: Diagnosis not present

## 2014-07-03 ENCOUNTER — Ambulatory Visit
Admit: 2014-07-03 | Disposition: A | Discharge status: Home / Self Care | Payer: Self-pay | Attending: Oncology | Admitting: Oncology

## 2014-07-03 LAB — COMPREHENSIVE METABOLIC PANEL
ANION GAP: 7 (ref 7–16)
Albumin: 4 g/dL
Alkaline Phosphatase: 174 U/L — ABNORMAL HIGH
BUN: 16 mg/dL
Bilirubin,Total: 0.6 mg/dL
CALCIUM: 9 mg/dL
CHLORIDE: 103 mmol/L
CO2: 28 mmol/L
Creatinine: 1 mg/dL
GFR CALC NON AF AMER: 57 — AB
GLUCOSE: 101 mg/dL — AB
Potassium: 3.9 mmol/L
SGOT(AST): 29 U/L
SGPT (ALT): 34 U/L
SODIUM: 138 mmol/L
Total Protein: 7.4 g/dL

## 2014-07-03 LAB — CBC CANCER CENTER
Basophil #: 0.1 x10 3/mm (ref 0.0–0.1)
Basophil %: 1.1 %
EOS ABS: 0.3 x10 3/mm (ref 0.0–0.7)
EOS PCT: 3.3 %
HCT: 40.8 % (ref 35.0–47.0)
HGB: 13.3 g/dL (ref 12.0–16.0)
LYMPHS ABS: 2.8 x10 3/mm (ref 1.0–3.6)
Lymphocyte %: 34.4 %
MCH: 25.9 pg — ABNORMAL LOW (ref 26.0–34.0)
MCHC: 32.5 g/dL (ref 32.0–36.0)
MCV: 80 fL (ref 80–100)
MONOS PCT: 6.2 %
Monocyte #: 0.5 x10 3/mm (ref 0.2–0.9)
Neutrophil #: 4.4 x10 3/mm (ref 1.4–6.5)
Neutrophil %: 55 %
PLATELETS: 240 x10 3/mm (ref 150–440)
RBC: 5.13 10*6/uL (ref 3.80–5.20)
RDW: 15.3 % — ABNORMAL HIGH (ref 11.5–14.5)
WBC: 8 x10 3/mm (ref 3.6–11.0)

## 2014-07-03 LAB — LACTATE DEHYDROGENASE: LDH: 147 U/L

## 2014-07-05 DIAGNOSIS — C884 Extranodal marginal zone b-cell lymphoma of mucosa-associated lymphoid tissue (malt-lymphoma) not having achieved remission: Secondary | ICD-10-CM

## 2014-07-05 HISTORY — DX: Extranodal marginal zone B-cell lymphoma of mucosa-associated lymphoid tissue (MALT-lymphoma): C88.4

## 2014-07-05 HISTORY — DX: Extranodal marginal zone b-cell lymphoma of mucosa-associated lymphoid tissue (malt-lymphoma) not having achieved remission: C88.40

## 2014-07-12 NOTE — Op Note (Signed)
PATIENT NAME:  Stacey Stone, Stacey Stone MR#:  [id number] DATE OF BIRTH:  Aug 29, 1945  DATE OF PROCEDURE:  11/24/2012  PREOPERATIVE DIAGNOSES:  1.  Postmenopausal bleeding.  2.  Submucosal endometrial fibroid.   POSTOPERATIVE DIAGNOSES: 1.  Postmenopausal bleeding.  2.  Endometrial submucosal fibroid.  3.  Endometrial polyp.   PROCEDURE PERFORMED:  Hysteroscopic resection of submucosal fibroid with the MyoSure resector.   SURGEON: Laverta Baltimore, M.D.   ANESTHESIA: LMA.   INDICATIONS: A 69 year old female with postmenopausal bleeding, saline infusion sonohysterography reveals a 2 x 2 x 1.6 x 2.4 cm submucosal fibroid.   DESCRIPTION OF PROCEDURE: After adequate LMA anesthesia, the patient was placed in the dorsal supine position, the legs in the candy cane stirrups. SCDs in place during the procedure. The patient received 2 grams IV cefoxitin for the case. The patient's lower abdomen and vagina were prepped with Betadine. Straight catheterization of the bladder yielded 50 mL of urine. A weighted speculum was placed in the posterior vaginal vault, and the anterior cervix was grasped with a single-tooth tenaculum. The cervix was then dilated to #15 Hanks dilator. The MyoSure rep was present during the procedure for instruction. The hysteroscope was advanced into the endometrial cavity without difficulty. A large posterior fibroid was identified. At that point, the MyoSure resector was brought up to the operative field and advanced into the endometrial cavity. Normal saline was used as the distending medium, and the resection of the fibroid took place 75 seconds with good effect of removal of the fibroid. There was a small polyp noted at the fundal area. This was removed also with the resector. Good hemostasis noted. The procedure was terminated.    ESTIMATED BLOOD LOSS: Minimal.    INTRAOPERATIVE FLUIDS: 700 mL.   NET DEFICIT NORMAL SALINE FROM THE DOLPHIN: 90 mL.   The patient tolerated the  procedure well. There were no complications. The patient was taken to the recovery room in good condition.     ____________________________ Boykin Nearing, MD tjs:dmm D: 11/24/2012 09:38:00 ET T: 11/24/2012 09:59:21 ET JOB#: [id number]  cc: Boykin Nearing, MD, <Dictator> Boykin Nearing MD ELECTRONICALLY SIGNED 12/03/2012 9:48

## 2014-07-13 NOTE — Discharge Summary (Signed)
PATIENT NAME:  Stacey Stone, Stacey Stone MR#:  [id number] DATE OF BIRTH:  1945/09/26  DATE OF ADMISSION:  02/05/2014 DATE OF DISCHARGE:  02/06/2014  DISCHARGE DIAGNOSES:  1.  Postoperative hypoxia.  2.  Sleep apnea, noncompliant with CPAP.  3.  Hypothyroid.  4.  Coronary artery disease.  5.  Hyperlipidemia.  6.  Hypertension.   DISCHARGE MEDICATIONS:  Synthroid 150 mcg daily, Plavix 75 mg daily, simvastatin 40 mg at bedtime, aspirin 81 mg daily, Advair 100/50 b.i.d., potassium 20 mEq 1.5 tabs daily, torsemide 20 mg 1-1/2 tabs daily, Cardizem CD 240 mg daily, Toprol-XL 100 mg daily, sertraline 50 mg daily, Celebrex 200 mg daily, AcipHex 20 mg daily, vitamin C 500 mg daily, Percocet 5/325 one tab t.i.d. p.r.n. pain, Augmentin 875 mg b.i.d.   REASON FOR ADMISSION: A 69 year old female postoperative right submandibular mass surgery, experienced hypoxia. Please see H and P for HPI,  past medical history, and physical exam.   HOSPITAL COURSE: The patient was admitted. Chest x-ray negative. Chest CT showed a nondescript right lower lobe 4 mm nodule, otherwise no pulmonary embolism. Laboratories were stable. She has not been compliant with her CPAP. She promises me she will at this point and she will go off soda and sweet tea to lose weight. Will need followup CPAP testing and ultimately may need nocturnal O2. She is 92% saturation on room air this morning and asymptomatic. Surgery overall went well. She will follow up with Dr. Tami Ribas in 1 week and me in 2 weeks.     ____________________________ Rusty Aus, MD mfm:bu D: 02/06/2014 08:01:51 ET T: 02/06/2014 12:53:33 ET JOB#: [id number]  cc: Rusty Aus, MD, <Dictator> Rusty Aus MD ELECTRONICALLY SIGNED 02/07/2014 8:18

## 2014-07-13 NOTE — H&P (Signed)
PATIENT NAME:  Stacey Stone, RAMSARAN MR#:  [id number] DATE OF BIRTH:  [birth year]  DATE OF ADMISSION:  02/05/2014  PRIMARY CARE PHYSICIAN:  Dr. Emily Filbert.   EARS, NOSE, AND THROAT PHYSICIAN:  Dr. Tami Ribas.    CHIEF COMPLAINT: Hypoxia.   HISTORY OF PRESENT ILLNESS: The patient is a 69 year old female with past medical history of hypothyroidism, coronary artery, hyperlipidemia, obstructive sleep apnea, hypertension, and other medical problems, was recently diagnosed with a knot on the right side of her mandible. She was evaluated by Dr. Tami Ribas and the patient is scheduled today to get submandibular gland excision on the right side. The patient came and got the procedure done today and while she was in the recovery room the patient was persistently hypoxic. Initially the patient was placed on CPAP and subsequently it was weaned off to 4 liters of oxygen. The patient admits that she has obstructive sleep apnea, but not using CPAP as she could not tolerate the mask. She sees Dr. Raul Del as an outpatient. The patient was on 2 liters of oxygen in the past, but recently as her oxygen saturations were good it was discontinued. Currently the patient is not on any home oxygen. The patient being persistently hypoxic, hospitalist team is consulted. During my examination the patient denies any chest pain or shortness of breath. She was lethargic but arousable and answering all questions appropriately. ABGs and initial chest x-ray were normal. Troponin was negative. D-dimer was elevated. As we were unable to wean her off the oxygen and also d-dimer being high, we have decided to admit the patient under observation status. At the time of admission the patient was saturating 93%-94% on 1 liter of oxygen. Without oxygen the patient is immediately desaturating to 78% on room air. Denies any history of COPD.    PAST MEDICAL HISTORY: Hypothyroidism, GERD, coronary artery disease nonobstructing, hyperlipidemia, obstructive  sleep apnea, hypertension.   PAST SURGICAL HISTORY: Hysterectomy in the past, partial right knee surgery, status post submandibular gland excision on the right side today.   ALLERGIES: CODEINE.   PSYCHOSOCIAL HISTORY: Lives at home with son. No history of smoking, alcohol, or illicit drug usage.   FAMILY HISTORY: Mom deceased at age 100 with a history of breast cancer.   HOME MEDICATIONS: Vitamin C 500 mg once daily, torsemide 20 mg po once daily, simvastatin 40 mg p.o. at bedtime, Zoloft 50 p.o. once daily, rabeprazole 20 mg p.o. once daily, potassium chloride 30 mg p.o. once daily, Plavix 75 mg p.o. once daily, Percocet 5/325 1-2 tablets p.o. every 4-6 hours as needed for pain, metoprolol succinate 100 mg 1 tablet p.o. once daily, levothyroxine 150 mcg p.o. once daily, diltiazem hydrochloride 240 mg 1 capsule p.o. once daily, Celebrex 200 mg 1 capsule p.o. once daily, Augmentin 875 mg p.o. q. 12 hours which was started by Dr. Tami Ribas after surgery, aspirin 81 mg p.o. once daily, Advair 100/50 one puff inhalation 2 times a day, Tylenol 2 tablets p.o. every 6 hours as needed.   REVIEW OF SYSTEMS:  CONSTITUTIONAL: Denies any fever or fatigue.  EYES: Denies blurry vision, double vision.  EARS, NOSE, AND THROAT: Denies epistaxis, discharge.  RESPIRATORY:  Denies cough, COPD.   CARDIOVASCULAR: No chest pain, palpitations, dizziness.  GASTROINTESTINAL: Denies nausea, vomiting, diarrhea, abdominal pain.  GENITOURINARY: No dysuria, hematuria.  GYNECOLOGIC AND BREASTS: Denies any breast mass or vaginal discharge.  ENDOCRINE: Denies polyuria, nocturia. Has history of hypothyroidism.  HEMATOLOGIC AND LYMPHATIC: No anemia, easy bruising or bleeding, but has  submandibular salivary gland enlargement.   INTEGUMENT:  No acne, rash, lesions.   MUSCULOSKELETAL: No joint pain in the neck and back.   PSYCHIATRIC:  No ADD or OCD.    PHYSICAL EXAMINATION:  VITAL SIGNS: Temperature 98.2, pulse is 67,  respirations 18, blood pressure 108/67, pulse oximetry 98% on 2 liters.  GENERAL APPEARANCE: Not in acute distress. Moderately built and obese.  HEENT: Normocephalic, atraumatic. Pupils are equally reacting to light and accommodation. The right submandibular area with a clean incision.   NECK: Supple, but range of motion is limited because of the pain on the right submandibular area.  LUNGS: Clear to auscultation bilaterally. No accessory muscle use. No anterior chest wall tenderness on palpation.  CARDIAC:  S1, S2, regular rate and rhythm. No murmurs.  ABDOMEN:  Soft, obese. Bowel sounds are positive in all 4 quadrants. Nontender, nondistended. No hepatosplenomegaly. No masses felt.  NEUROLOGIC:  Lethargic but arousable and oriented x 3. Answering questions appropriately. Motor and sensory are intact. Reflexes are 2 +.  EXTREMITIES: No edema. No cyanosis. No clubbing.  SKIN: Warm to touch. Normal turgor. No rashes. No lesions.  MUSCULOSKELETAL: No joint effusion, tenderness, erythema.   PSYCHIATRIC:  Flat mood and affect.   LABORATORIES AND IMAGING STUDIES:  CK total 45, CPK-MB less than 0.5, troponin less than 0.02. WBC 11.7, hemoglobin, hematocrit, platelet count are normal. D-dimer is elevated at 782. Blood gas, pH 7.35,  pO2 109, FiO2 36%, bicarbonate is 27.6.  On BMP GFR is at 43. Serum osmolality, calcium are normal. Glucose 141, rest of the BMP is normal.    EKG, sinus bradycardia at 58, first degree AV block with prolonged PR interval, nonspecific ST-T wave changes, low voltage.  Portable chest x-ray, no evidence of pneumonia or CHF, minimal subsegmental atelectasis at the left lung base is present   ASSESSMENT AND PLAN: A 69 year old obese female with history of obstructive sleep apnea who came into outpatient procedures and had right submandibular salivary gland excision by Dr. Tami Ribas and patient was persistently hypoxemic in the recovery room. Hospitalist team is consulted.    1.   Persistent hypoxia probably from Pickwickian syndrome with a component of obstructive sleep apnea. With D-dimer being elevated we will rule out pulmonary embolism. We will admit her to observation status with telemetry. We will check continuous pulse oximetry. The patient had a history of hypoxemia and was on home oxygen in the past, which was discontinued and if she is persistently hypoxic she might qualify for home oxygen at this time. A CTA chest is ordered to rule out pulmonary embolism.  2.  Submandibular salivary gland excision.  We will provide Percocet as needed and Augmentin as recommended by Dr. Tami Ribas. Continue wound care and outpatient follow up with Dr. Tami Ribas as requested by him. Pathology report of the salivary gland needs to be followed by primary care physician and Dr. Tami Ribas.  3.  History of hypothyroidism. Continue Synthroid.  4.  Hyperlipidemia. Resume her home medication.   5.  Obstructive sleep apnea. We will provide CPAP at bedtime.  6.  Hypertension. Resume home medications with holding parameters.  7.  Gastroesophageal reflux disease.  We will provide Pepcid.  8.  We will check CBC in a.m.  9.  We will provide GI and DVT prophylaxis.   CODE STATUS: She is full code. Daughter is the medical power of attorney.   The patient will be transferred to Dr. Emily Filbert. The plan of care was discussed with the  patient, she is aware of the plan.    TOTAL TIME SPENT: 50 minutes.      ____________________________ Nicholes Mango, MD ag:bu D: 02/05/2014 15:25:00 ET T: 02/05/2014 15:59:47 ET JOB#: [id number]  cc: Nicholes Mango, MD, <Dictator> Nicholes Mango MD ELECTRONICALLY SIGNED 02/15/2014 17:58

## 2014-07-13 NOTE — Op Note (Signed)
PATIENT NAME:  Stacey Stone, STANDRE MR#:  [id number] DATE OF BIRTH:  October 17, 1945  DATE OF PROCEDURE:  02/05/2014  PREOPERATIVE DIAGNOSIS: Right submandibular gland mass.  POSTOPERATIVE DIAGNOSIS: Right submandibular gland mass.   ATTENDING SURGEON:  Roena Malady, MD  ASSISTANT SURGEON:  Margaretha Sheffield, MD.   OPERATIONS PERFORMED:  1. Excision of right submandibular gland.  2. Facial nerve monitoring approximately 1 hour.   OPERATIVE FINDINGS: A firm, sclerotic approximately 2 x 3 cm submandibular gland.   DESCRIPTION OF PROCEDURE: Ms. Bilello was identified in the holding area and taken to the operating room and placed in the supine position. After general endotracheal anesthesia, the table was turned 90 degrees.  The facial nerve monitoring device was applied to the lower lip to monitor the marginal branch of the facial nerve. This remained intact in all throughout the procedure.   An incision line was marked approximately 2 fingerbreadths below the angle of mandible overlying the submandibular gland. A local anesthetic of 1% lidocaine with 1:100,000 epinephrine was used to inject all along this incision mark. A total of 4 mL was used. The neck and face were then prepped and draped sterilely. A 15 blade was used to incise down to and through the platysma muscle. Hemostasis was achieved using the Bovie and bipolar cauteries.  Using the facial monitor stimulator,  the marginal branch of the facial nerve was identified and retracted superiorly and posteriorly and remained intact throughout the procedure. The capsule of the submandibular gland was easily identified. Dissection proceeded around the gland adjacent to the capsule. There were several small feeding vessels which were divided using the Harmonic scalpel. On the right posterior aspect there was an arterial feed into this which was divided and suture ligated using 2-0 silk suture. As the gland was retracted medially, the fibrous  portions of the capsule were divided from the surrounding tissue. The dissection then proceeded anteriorly and medially. The mylohyoid muscle was identified and retracted anteriorly. The submandibular duct, as it went beneath the mylohyoid muscle, was divided using the Harmonic scalpel.   As the gland was pulled inferiorly, the lingual nerve was identified. The submandibular ganglion, which connected the gland to the lingual nerve, was divided using the Harmonic scalpel. This allowed the gland to be removed.  Any other small feeding vessels were divided using the Harmonic scalpel. Medial and inferior to the nerve, there was 1 other small piece of gland. This was gently dissected away from the nerve and removed using the Harmonic scalpel.   With the gland removed, the wound was then copiously irrigated with saline. Any small bleeding points were cauterized using the microbipolar. A #7 TLS drain was brought out of wound inferiorly. Surgicel was then placed within the submandibular bed. With no active bleeding, the  platysma muscle was closed using interrupted 4-0 Vicryl. The subcutaneous tissues were closed using 4-0 Vicryl, and the skin was closed using sterile skin glue. The drain was then affixed to the skin using a Tegaderm. The patient was then returned to anesthesia where she was awakened in the operating room and taken to the recovery room in stable condition.   CULTURES: None.   SPECIMENS: Right submandibular gland.   ESTIMATED BLOOD LOSS: Less than 10 mL.    ____________________________ Roena Malady, MD [id number] D: 02/05/2014 08:35:00 ET T: 02/05/2014 12:47:11 ET JOB#: [id number]  cc: Roena Malady, MD, <Dictator> Roena Malady MD ELECTRONICALLY SIGNED 02/15/2014 7:59

## 2014-07-15 LAB — SURGICAL PATHOLOGY

## 2014-07-30 ENCOUNTER — Encounter (INDEPENDENT_AMBULATORY_CARE_PROVIDER_SITE_OTHER): Payer: BLUE CROSS/BLUE SHIELD | Admitting: Ophthalmology

## 2014-07-30 DIAGNOSIS — H34812 Central retinal vein occlusion, left eye: Secondary | ICD-10-CM | POA: Diagnosis not present

## 2014-07-30 DIAGNOSIS — I10 Essential (primary) hypertension: Secondary | ICD-10-CM

## 2014-07-30 DIAGNOSIS — H35033 Hypertensive retinopathy, bilateral: Secondary | ICD-10-CM

## 2014-07-30 DIAGNOSIS — H43813 Vitreous degeneration, bilateral: Secondary | ICD-10-CM

## 2014-07-30 DIAGNOSIS — H34831 Tributary (branch) retinal vein occlusion, right eye: Secondary | ICD-10-CM

## 2014-07-30 DIAGNOSIS — H338 Other retinal detachments: Secondary | ICD-10-CM | POA: Diagnosis not present

## 2014-08-28 ENCOUNTER — Encounter (INDEPENDENT_AMBULATORY_CARE_PROVIDER_SITE_OTHER): Payer: BLUE CROSS/BLUE SHIELD | Admitting: Ophthalmology

## 2014-08-28 DIAGNOSIS — H34831 Tributary (branch) retinal vein occlusion, right eye: Secondary | ICD-10-CM | POA: Diagnosis not present

## 2014-08-28 DIAGNOSIS — H43813 Vitreous degeneration, bilateral: Secondary | ICD-10-CM

## 2014-08-28 DIAGNOSIS — I10 Essential (primary) hypertension: Secondary | ICD-10-CM | POA: Diagnosis not present

## 2014-08-28 DIAGNOSIS — H35033 Hypertensive retinopathy, bilateral: Secondary | ICD-10-CM | POA: Diagnosis not present

## 2014-08-28 DIAGNOSIS — H33302 Unspecified retinal break, left eye: Secondary | ICD-10-CM

## 2014-08-28 DIAGNOSIS — H34812 Central retinal vein occlusion, left eye: Secondary | ICD-10-CM

## 2014-08-28 DIAGNOSIS — H3531 Nonexudative age-related macular degeneration: Secondary | ICD-10-CM | POA: Diagnosis not present

## 2014-09-25 ENCOUNTER — Encounter (INDEPENDENT_AMBULATORY_CARE_PROVIDER_SITE_OTHER): Payer: 59 | Admitting: Ophthalmology

## 2014-09-25 DIAGNOSIS — H34831 Tributary (branch) retinal vein occlusion, right eye: Secondary | ICD-10-CM | POA: Diagnosis not present

## 2014-09-25 DIAGNOSIS — H34812 Central retinal vein occlusion, left eye: Secondary | ICD-10-CM

## 2014-09-25 DIAGNOSIS — H43813 Vitreous degeneration, bilateral: Secondary | ICD-10-CM | POA: Diagnosis not present

## 2014-09-25 DIAGNOSIS — H35033 Hypertensive retinopathy, bilateral: Secondary | ICD-10-CM

## 2014-09-25 DIAGNOSIS — I10 Essential (primary) hypertension: Secondary | ICD-10-CM

## 2014-10-30 ENCOUNTER — Encounter (INDEPENDENT_AMBULATORY_CARE_PROVIDER_SITE_OTHER): Payer: 59 | Admitting: Ophthalmology

## 2014-10-30 DIAGNOSIS — H34812 Central retinal vein occlusion, left eye: Secondary | ICD-10-CM

## 2014-10-30 DIAGNOSIS — I10 Essential (primary) hypertension: Secondary | ICD-10-CM

## 2014-10-30 DIAGNOSIS — H34831 Tributary (branch) retinal vein occlusion, right eye: Secondary | ICD-10-CM

## 2014-10-30 DIAGNOSIS — H338 Other retinal detachments: Secondary | ICD-10-CM | POA: Diagnosis not present

## 2014-10-30 DIAGNOSIS — H43813 Vitreous degeneration, bilateral: Secondary | ICD-10-CM

## 2014-10-30 DIAGNOSIS — H35033 Hypertensive retinopathy, bilateral: Secondary | ICD-10-CM | POA: Diagnosis not present

## 2014-11-01 ENCOUNTER — Other Ambulatory Visit: Payer: Self-pay | Admitting: *Deleted

## 2014-11-01 DIAGNOSIS — C8590 Non-Hodgkin lymphoma, unspecified, unspecified site: Secondary | ICD-10-CM

## 2014-11-06 ENCOUNTER — Inpatient Hospital Stay (HOSPITAL_BASED_OUTPATIENT_CLINIC_OR_DEPARTMENT_OTHER): Payer: 59 | Admitting: Oncology

## 2014-11-06 ENCOUNTER — Encounter: Payer: Self-pay | Admitting: Oncology

## 2014-11-06 ENCOUNTER — Inpatient Hospital Stay: Payer: 59 | Attending: Oncology

## 2014-11-06 VITALS — BP 123/77 | HR 51 | Temp 97.4°F | Wt 191.4 lb

## 2014-11-06 DIAGNOSIS — Z7982 Long term (current) use of aspirin: Secondary | ICD-10-CM | POA: Diagnosis not present

## 2014-11-06 DIAGNOSIS — G4710 Hypersomnia, unspecified: Secondary | ICD-10-CM | POA: Insufficient documentation

## 2014-11-06 DIAGNOSIS — Z79899 Other long term (current) drug therapy: Secondary | ICD-10-CM | POA: Diagnosis not present

## 2014-11-06 DIAGNOSIS — C884 Extranodal marginal zone B-cell lymphoma of mucosa-associated lymphoid tissue [MALT-lymphoma]: Secondary | ICD-10-CM | POA: Insufficient documentation

## 2014-11-06 DIAGNOSIS — M7989 Other specified soft tissue disorders: Secondary | ICD-10-CM | POA: Insufficient documentation

## 2014-11-06 DIAGNOSIS — E039 Hypothyroidism, unspecified: Secondary | ICD-10-CM

## 2014-11-06 DIAGNOSIS — K219 Gastro-esophageal reflux disease without esophagitis: Secondary | ICD-10-CM | POA: Insufficient documentation

## 2014-11-06 DIAGNOSIS — R109 Unspecified abdominal pain: Secondary | ICD-10-CM | POA: Insufficient documentation

## 2014-11-06 DIAGNOSIS — I2510 Atherosclerotic heart disease of native coronary artery without angina pectoris: Secondary | ICD-10-CM | POA: Insufficient documentation

## 2014-11-06 DIAGNOSIS — G4730 Sleep apnea, unspecified: Secondary | ICD-10-CM

## 2014-11-06 DIAGNOSIS — I359 Nonrheumatic aortic valve disorder, unspecified: Secondary | ICD-10-CM | POA: Insufficient documentation

## 2014-11-06 DIAGNOSIS — C8590 Non-Hodgkin lymphoma, unspecified, unspecified site: Secondary | ICD-10-CM

## 2014-11-06 DIAGNOSIS — I10 Essential (primary) hypertension: Secondary | ICD-10-CM

## 2014-11-06 LAB — CBC WITH DIFFERENTIAL/PLATELET
Basophils Absolute: 0.1 10*3/uL (ref 0–0.1)
Basophils Relative: 1 %
EOS ABS: 0.2 10*3/uL (ref 0–0.7)
EOS PCT: 2 %
HCT: 42.1 % (ref 35.0–47.0)
Hemoglobin: 14 g/dL (ref 12.0–16.0)
LYMPHS ABS: 3.4 10*3/uL (ref 1.0–3.6)
Lymphocytes Relative: 33 %
MCH: 26.5 pg (ref 26.0–34.0)
MCHC: 33.4 g/dL (ref 32.0–36.0)
MCV: 79.5 fL — ABNORMAL LOW (ref 80.0–100.0)
MONOS PCT: 7 %
Monocytes Absolute: 0.7 10*3/uL (ref 0.2–0.9)
Neutro Abs: 5.8 10*3/uL (ref 1.4–6.5)
Neutrophils Relative %: 57 %
PLATELETS: 264 10*3/uL (ref 150–440)
RBC: 5.29 MIL/uL — AB (ref 3.80–5.20)
RDW: 15.5 % — ABNORMAL HIGH (ref 11.5–14.5)
WBC: 10.2 10*3/uL (ref 3.6–11.0)

## 2014-11-06 LAB — COMPREHENSIVE METABOLIC PANEL
ALT: 28 U/L (ref 14–54)
ANION GAP: 5 (ref 5–15)
AST: 29 U/L (ref 15–41)
Albumin: 4.2 g/dL (ref 3.5–5.0)
Alkaline Phosphatase: 159 U/L — ABNORMAL HIGH (ref 38–126)
BUN: 17 mg/dL (ref 6–20)
CHLORIDE: 102 mmol/L (ref 101–111)
CO2: 30 mmol/L (ref 22–32)
Calcium: 8.7 mg/dL — ABNORMAL LOW (ref 8.9–10.3)
Creatinine, Ser: 1.1 mg/dL — ABNORMAL HIGH (ref 0.44–1.00)
GFR calc non Af Amer: 50 mL/min — ABNORMAL LOW (ref >60)
GFR, EST AFRICAN AMERICAN: 58 mL/min — AB (ref >60)
Glucose, Bld: 94 mg/dL (ref 65–99)
POTASSIUM: 3.8 mmol/L (ref 3.5–5.1)
SODIUM: 137 mmol/L (ref 135–145)
Total Bilirubin: 0.4 mg/dL (ref 0.3–1.2)
Total Protein: 7.8 g/dL (ref 6.5–8.1)

## 2014-11-06 LAB — LACTATE DEHYDROGENASE: LDH: 143 U/L (ref 98–192)

## 2014-11-06 NOTE — Progress Notes (Signed)
Patient does have living will. Never smoked. 

## 2014-11-06 NOTE — Progress Notes (Signed)
[place] @ [facility] [phone]  [phone]) [phone]     Stacey Stone OB: May 25, 1945  MR#: [id number]  WPV#:[id number]  No care team member to display  CHIEF COMPLAINT:  Chief Complaint  Patient presents with  . 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  Marked lymphoma of right submandibular gland. Totally excised November of 2015 clinically staged  as  1e. HPI:       MALT (mucosa associated lymphoid tissue)   07/05/2014 Initial Diagnosis MALT (mucosa associated lymphoid tissue)    Oncology Flowsheet 02/16/2011  dexamethasone (DECADRON) IJ -  ondansetron (ZOFRAN) IJ -   69 year old lady who had swelling on the right side of the neck was evaluated by ENT surgeon.  Needle biopsy was inconclusive.  Patient was followed and after 4 months and was further growth of right submandibular gland so excision was done.  Tissue was sent [facility] for further evaluation.  There was dense lymphocytic infiltrate of the submandibular gland.  It was positive for CD3 and CD20.  Molecular study demonstrative clonal arrangement.  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   Patient is under observation for MALT lymphoma.  Patient has noticed some abdominal cramps and pain for last few days.  No chills.  No fever.  No diarrhea.  Also noticed some swelling on the right side of the face where surgery was done.  INTERVAL HISTORY:  69 year old lady came today for further follow-up regarding marginal zone lymphoma. No evidence of any swelling.  No chills.  No fever.  Appetite remains stable.  Patient continues to be very anxious. REVIEW OF SYSTEMS:   GENERAL:  Feels good.  Active.  No fevers, sweats or weight loss. PERFORMANCE STATUS (ECOG):  0 HEENT:  No visual changes, runny nose, sore throat, mouth sores or tenderness. Lungs: No shortness of breath or cough.  No hemoptysis. Cardiac:  No chest  pain, palpitations, orthopnea, or PND. GI:  No nausea, vomiting, diarrhea, constipation, melena or hematochezia. GU:  No urgency, frequency, dysuria, or hematuria. Musculoskeletal:  No back pain.  No joint pain.  No muscle tenderness. Extremities:  No pain or swelling. Skin:  No rashes or skin changes. Neuro:  No headache, numbness or weakness, balance or coordination issues. Endocrine:  No diabetes, thyroid issues, hot flashes or night sweats. Psych:  No mood changes, depression or anxiety. Pain:  No focal pain. Review of systems:  All other systems reviewed and found to be negative. As per HPI. Otherwise, a complete review of systems is negatve.  PAST MEDICAL HISTORY: Past Medical History  Diagnosis Date  . Coronary artery disease T    1 artery 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 Mamie Nick. at [facility] in [place]  . No pertinent past medical history   . Sleep apnea t    O2- 2l at bedtime and BiPap @ bedtime  . Hypertension   . GERD (gastroesophageal reflux disease)   . Hypothyroidism   . Arthritis t    knee  . Hiatal hernia   . Depression   . MALT (mucosa associated lymphoid tissue) 07/05/2014    PAST SURGICAL HISTORY: Past Surgical History  Procedure Laterality Date  . Eye surgery  t    bil cataract 9/08  . Cholecystectomy  11/26    08/1970  . Joint replacement  02/15/2011    R Knee 2008  . Scleral buckle  02/16/2011    Procedure: SCLERAL BUCKLE;  Surgeon: Jenny Reichmann  Eber Jones, MD;  Location: [place];  Service: Ophthalmology;  Laterality: Left;  Scleral Buckle Left Eye with Headscope Laser    FAMILY HISTORY Family History  Problem Relation Age of Onset  . Anesthesia problems Neg Hx   . Hypotension Neg Hx   . Malignant hyperthermia Neg Hx   . Pseudochol deficiency Neg Hx    ignificant History/PMH:   htn:    gallabladder surgery:    D&C:    fibroid tumor removed:    partial right knee [id number]:    hysterectomy:   Preventive Screening:  Has patient had any of  the following test? Colonscopy  Mammography  Pap Smear (1)   Last Colonoscopy: 2014(1)   Last Mammography: 2015(1)   Last Pap Smear: 2014(1)   PFSH: Family History: negative  Social History: negative alcohol, negative tobacco  Additional Past Medical and Surgical History: As mentioned above   CORONARY ARTERY DISEASE  She also has sleep apnea   ADVANCED DIRECTIVES:  Patient does have advance healthcare directive, Patient   does not desire to make any changes  HEALTH MAINTENANCE: Social History  Substance Use Topics  . Smoking status: Never Smoker   . Smokeless tobacco: None  . Alcohol Use: No      Allergies  Allergen Reactions  . Codeine Other (See Comments)    Stroke like symptoms    Current Outpatient Prescriptions  Medication Sig Dispense Refill  . Acetaminophen 500 MG coapsule Take by mouth.    Marland Kitchen aspirin EC 81 MG tablet Take 81 mg by mouth daily.      . celecoxib (CELEBREX) 200 MG capsule Take 200 mg by mouth daily.      Marland Kitchen diltiazem (CARDIZEM CD) 240 MG 24 hr capsule Take 240 mg by mouth daily.      . Fluticasone-Salmeterol (ADVAIR DISKUS) 250-50 MCG/DOSE AEPB Inhale into the lungs.    Marland Kitchen levothyroxine (SYNTHROID, LEVOTHROID) 150 MCG tablet Take 150 mcg by mouth daily.      . metoprolol (TOPROL-XL) 100 MG 24 hr tablet Take 100 mg by mouth daily.      . Multiple Vitamins-Minerals (MULTIVITAMINS THER. W/MINERALS) TABS Take 1 tablet by mouth daily.      . potassium chloride SA (K-DUR,KLOR-CON) 20 MEQ tablet Take 30 mEq by mouth daily.      . RABEprazole (ACIPHEX) 20 MG tablet Take 20 mg by mouth daily.      . sertraline (ZOLOFT) 50 MG tablet Take 50 mg by mouth at bedtime.      . simvastatin (ZOCOR) 40 MG tablet Take 40 mg by mouth at bedtime.      . torsemide (DEMADEX) 20 MG tablet Take 30 mg by mouth daily.      . beta carotene w/minerals (OCUVITE) tablet Take 1 tablet by mouth 2 (two) times daily.      . calcium carbonate (TUMS EX) 750 MG chewable tablet Chew 1  tablet by mouth daily.     . clopidogrel (PLAVIX) 75 MG tablet Take 75 mg by mouth daily.      Marland Kitchen gatifloxacin (ZYMAXID) 0.5 % SOLN Place 1 drop into the left eye 4 (four) times daily. (Patient not taking: Reported on 11/06/2014)    . vitamin C (ASCORBIC ACID) 500 MG tablet Take 500 mg by mouth daily.       No current facility-administered medications for this visit.    OBJECTIVE:  Filed Vitals:   11/06/14 1459  BP: 123/77  Pulse: 51  Temp: 97.4 F (36.3  C)     Body mass index is 33.91 kg/(m^2).    ECOG FS:0 - Asymptomatic  PHYSICAL EXAM: General  status: Performance status is good.  Patient has not lost significant weight HEENT: No evidence of stomatitis. Sclera and conjunctivae :: No jaundice.   pale looking. No swelling no palpable lymphadenopathy Lungs: Air  entry equal on both sides.  No rhonchi.  No rales.  Cardiac: Heart sounds are normal.  No pericardial rub.  No murmur. Lymphatic system: Cervical, axillary, inguinal, lymph nodes not palpable GI: Abdomen is soft.  No ascites.  Liver spleen not palpable.  No tenderness.  Bowel sounds are within normal limit Lower extremity: No edema Neurological system: Higher functions, cranial nerves intact no evidence of peripheral neuropathy. Skin: No rash.  No ecchymosis. Marland Kitchen   LAB RESULTS:  CBC Latest Ref Rng 11/06/2014 07/03/2014  WBC 3.6 - 11.0 K/uL 10.2 8.0  Hemoglobin 12.0 - 16.0 g/dL 14.0 13.3  Hematocrit 35.0 - 47.0 % 42.1 40.8  Platelets 150 - 440 K/uL 264 240    Appointment on 11/06/2014  Component Date Value Ref Range Status  . WBC 11/06/2014 10.2  3.6 - 11.0 K/uL Final  . RBC 11/06/2014 5.29* 3.80 - 5.20 MIL/uL Final  . Hemoglobin 11/06/2014 14.0  12.0 - 16.0 g/dL Final  . HCT 11/06/2014 42.1  35.0 - 47.0 % Final  . MCV 11/06/2014 79.5* 80.0 - 100.0 fL Final  . MCH 11/06/2014 26.5  26.0 - 34.0 pg Final  . MCHC 11/06/2014 33.4  32.0 - 36.0 g/dL Final  . RDW 11/06/2014 15.5* 11.5 - 14.5 % Final  . Platelets 11/06/2014  264  150 - 440 K/uL Final  . Neutrophils Relative % 11/06/2014 57   Final  . Neutro Abs 11/06/2014 5.8  1.4 - 6.5 K/uL Final  . Lymphocytes Relative 11/06/2014 33   Final  . Lymphs Abs 11/06/2014 3.4  1.0 - 3.6 K/uL Final  . Monocytes Relative 11/06/2014 7   Final  . Monocytes Absolute 11/06/2014 0.7  0.2 - 0.9 K/uL Final  . Eosinophils Relative 11/06/2014 2   Final  . Eosinophils Absolute 11/06/2014 0.2  0 - 0.7 K/uL Final  . Basophils Relative 11/06/2014 1   Final  . Basophils Absolute 11/06/2014 0.1  0 - 0.1 K/uL Final        ASSESSMENT: Stage I Nodal marginal zone lymphoma No clinical evidence of recurrent disease  MEDICAL DECISION MAKING:  All lab data has been reviewed  Patient expressed understanding and was in agreement with this plan. She also understands that She can call clinic at any time with any questions, concerns, or complaints.    No matching staging information was found for the patient.  Forest Gleason, MD   11/06/2014 3:26 PM

## 2014-11-27 ENCOUNTER — Encounter: Payer: Self-pay | Admitting: Oncology

## 2014-12-11 ENCOUNTER — Ambulatory Visit (INDEPENDENT_AMBULATORY_CARE_PROVIDER_SITE_OTHER): Payer: BLUE CROSS/BLUE SHIELD | Admitting: Ophthalmology

## 2014-12-11 ENCOUNTER — Encounter (INDEPENDENT_AMBULATORY_CARE_PROVIDER_SITE_OTHER): Payer: 59 | Admitting: Ophthalmology

## 2014-12-11 DIAGNOSIS — H33303 Unspecified retinal break, bilateral: Secondary | ICD-10-CM | POA: Diagnosis not present

## 2014-12-11 DIAGNOSIS — H43813 Vitreous degeneration, bilateral: Secondary | ICD-10-CM | POA: Diagnosis not present

## 2014-12-11 DIAGNOSIS — H34812 Central retinal vein occlusion, left eye: Secondary | ICD-10-CM

## 2014-12-11 DIAGNOSIS — I10 Essential (primary) hypertension: Secondary | ICD-10-CM

## 2014-12-11 DIAGNOSIS — H34831 Tributary (branch) retinal vein occlusion, right eye: Secondary | ICD-10-CM

## 2014-12-11 DIAGNOSIS — H35033 Hypertensive retinopathy, bilateral: Secondary | ICD-10-CM | POA: Diagnosis not present

## 2014-12-15 ENCOUNTER — Observation Stay
Admission: EM | Admit: 2014-12-15 | Discharge: 2014-12-16 | Disposition: A | Discharge status: Home / Self Care | Payer: 59 | Attending: Internal Medicine | Admitting: Internal Medicine

## 2014-12-15 ENCOUNTER — Emergency Department: Payer: 59

## 2014-12-15 ENCOUNTER — Encounter: Payer: Self-pay | Admitting: Emergency Medicine

## 2014-12-15 DIAGNOSIS — G4730 Sleep apnea, unspecified: Secondary | ICD-10-CM | POA: Diagnosis not present

## 2014-12-15 DIAGNOSIS — Z79899 Other long term (current) drug therapy: Secondary | ICD-10-CM | POA: Diagnosis not present

## 2014-12-15 DIAGNOSIS — Z9981 Dependence on supplemental oxygen: Secondary | ICD-10-CM | POA: Insufficient documentation

## 2014-12-15 DIAGNOSIS — G4733 Obstructive sleep apnea (adult) (pediatric): Secondary | ICD-10-CM | POA: Insufficient documentation

## 2014-12-15 DIAGNOSIS — Z96651 Presence of right artificial knee joint: Secondary | ICD-10-CM | POA: Diagnosis not present

## 2014-12-15 DIAGNOSIS — Z7982 Long term (current) use of aspirin: Secondary | ICD-10-CM | POA: Diagnosis not present

## 2014-12-15 DIAGNOSIS — I2510 Atherosclerotic heart disease of native coronary artery without angina pectoris: Secondary | ICD-10-CM | POA: Insufficient documentation

## 2014-12-15 DIAGNOSIS — F329 Major depressive disorder, single episode, unspecified: Secondary | ICD-10-CM | POA: Diagnosis not present

## 2014-12-15 DIAGNOSIS — R079 Chest pain, unspecified: Secondary | ICD-10-CM | POA: Diagnosis present

## 2014-12-15 DIAGNOSIS — I10 Essential (primary) hypertension: Secondary | ICD-10-CM | POA: Insufficient documentation

## 2014-12-15 DIAGNOSIS — K219 Gastro-esophageal reflux disease without esophagitis: Secondary | ICD-10-CM | POA: Diagnosis not present

## 2014-12-15 DIAGNOSIS — R000 Tachycardia, unspecified: Secondary | ICD-10-CM | POA: Insufficient documentation

## 2014-12-15 DIAGNOSIS — K449 Diaphragmatic hernia without obstruction or gangrene: Secondary | ICD-10-CM | POA: Diagnosis not present

## 2014-12-15 DIAGNOSIS — Z885 Allergy status to narcotic agent status: Secondary | ICD-10-CM | POA: Insufficient documentation

## 2014-12-15 DIAGNOSIS — E039 Hypothyroidism, unspecified: Secondary | ICD-10-CM | POA: Diagnosis not present

## 2014-12-15 DIAGNOSIS — Z7902 Long term (current) use of antithrombotics/antiplatelets: Secondary | ICD-10-CM | POA: Diagnosis not present

## 2014-12-15 DIAGNOSIS — I517 Cardiomegaly: Secondary | ICD-10-CM | POA: Diagnosis not present

## 2014-12-15 DIAGNOSIS — R072 Precordial pain: Secondary | ICD-10-CM | POA: Diagnosis not present

## 2014-12-15 DIAGNOSIS — I4891 Unspecified atrial fibrillation: Secondary | ICD-10-CM | POA: Diagnosis present

## 2014-12-15 DIAGNOSIS — C884 Extranodal marginal zone B-cell lymphoma of mucosa-associated lymphoid tissue [MALT-lymphoma]: Secondary | ICD-10-CM | POA: Diagnosis not present

## 2014-12-15 DIAGNOSIS — I480 Paroxysmal atrial fibrillation: Principal | ICD-10-CM | POA: Diagnosis present

## 2014-12-15 DIAGNOSIS — M79602 Pain in left arm: Secondary | ICD-10-CM | POA: Diagnosis not present

## 2014-12-15 LAB — URINALYSIS COMPLETE WITH MICROSCOPIC (ARMC ONLY)
BACTERIA UA: NONE SEEN
BILIRUBIN URINE: NEGATIVE
Glucose, UA: NEGATIVE mg/dL
HGB URINE DIPSTICK: NEGATIVE
Ketones, ur: NEGATIVE mg/dL
Nitrite: NEGATIVE
PH: 8 (ref 5.0–8.0)
PROTEIN: NEGATIVE mg/dL
Specific Gravity, Urine: 1.009 (ref 1.005–1.030)

## 2014-12-15 LAB — BASIC METABOLIC PANEL
Anion gap: 9 (ref 5–15)
BUN: 14 mg/dL (ref 6–20)
CALCIUM: 9.7 mg/dL (ref 8.9–10.3)
CHLORIDE: 106 mmol/L (ref 101–111)
CO2: 26 mmol/L (ref 22–32)
CREATININE: 0.87 mg/dL (ref 0.44–1.00)
GFR calc non Af Amer: >60 mL/min (ref >60)
Glucose, Bld: 121 mg/dL — ABNORMAL HIGH (ref 65–99)
Potassium: 3.3 mmol/L — ABNORMAL LOW (ref 3.5–5.1)
SODIUM: 141 mmol/L (ref 135–145)

## 2014-12-15 LAB — CBC
HCT: 40.7 % (ref 35.0–47.0)
Hemoglobin: 13.3 g/dL (ref 12.0–16.0)
MCH: 26.2 pg (ref 26.0–34.0)
MCHC: 32.8 g/dL (ref 32.0–36.0)
MCV: 80 fL (ref 80.0–100.0)
PLATELETS: 197 10*3/uL (ref 150–440)
RBC: 5.09 MIL/uL (ref 3.80–5.20)
RDW: 16 % — AB (ref 11.5–14.5)
WBC: 7.2 10*3/uL (ref 3.6–11.0)

## 2014-12-15 LAB — MAGNESIUM: Magnesium: 2.4 mg/dL (ref 1.7–2.4)

## 2014-12-15 LAB — TSH: TSH: 0.417 u[IU]/mL (ref 0.350–4.500)

## 2014-12-15 LAB — TROPONIN I

## 2014-12-15 LAB — PHOSPHORUS: Phosphorus: 2.4 mg/dL — ABNORMAL LOW (ref 2.5–4.6)

## 2014-12-15 MED ORDER — ONDANSETRON HCL 4 MG/2ML IJ SOLN: 4.0000 mg | Freq: Four times a day (QID) | INTRAMUSCULAR | Status: DC | PRN

## 2014-12-15 MED ORDER — ACETAMINOPHEN 650 MG RE SUPP: 650.0000 mg | Freq: Four times a day (QID) | RECTAL | Status: DC | PRN

## 2014-12-15 MED ORDER — ASPIRIN EC 81 MG PO TBEC
81.0000 mg | DELAYED_RELEASE_TABLET | Freq: Every day | ORAL | Status: DC
  Administered 2014-12-16: 81 mg via ORAL
  Filled 2014-12-15: qty 1

## 2014-12-15 MED ORDER — SERTRALINE HCL 50 MG PO TABS
50.0000 mg | ORAL_TABLET | Freq: Every day | ORAL | Status: DC
  Administered 2014-12-15: 50 mg via ORAL
  Filled 2014-12-15 (×2): qty 1

## 2014-12-15 MED ORDER — ACETAMINOPHEN 325 MG PO TABS: 650.0000 mg | ORAL_TABLET | Freq: Four times a day (QID) | ORAL | Status: DC | PRN

## 2014-12-15 MED ORDER — PANTOPRAZOLE SODIUM 40 MG PO TBEC
40.0000 mg | DELAYED_RELEASE_TABLET | Freq: Every day | ORAL | Status: DC
  Administered 2014-12-15 – 2014-12-16 (×2): 40 mg via ORAL
  Filled 2014-12-15 (×2): qty 1

## 2014-12-15 MED ORDER — ONDANSETRON HCL 4 MG PO TABS: 4.0000 mg | ORAL_TABLET | Freq: Four times a day (QID) | ORAL | Status: DC | PRN

## 2014-12-15 MED ORDER — HEPARIN SODIUM (PORCINE) 5000 UNIT/ML IJ SOLN
5000.0000 [IU] | Freq: Three times a day (TID) | INTRAMUSCULAR | Status: DC
  Administered 2014-12-15 – 2014-12-16 (×2): 5000 [IU] via SUBCUTANEOUS
  Filled 2014-12-15 (×2): qty 1

## 2014-12-15 MED ORDER — SIMVASTATIN 40 MG PO TABS
40.0000 mg | ORAL_TABLET | Freq: Every day | ORAL | Status: DC
  Administered 2014-12-15: 40 mg via ORAL
  Filled 2014-12-15: qty 1

## 2014-12-15 MED ORDER — LEVOTHYROXINE SODIUM 150 MCG PO TABS
150.0000 ug | ORAL_TABLET | Freq: Every day | ORAL | Status: DC
  Administered 2014-12-16: 150 ug via ORAL
  Filled 2014-12-15: qty 1

## 2014-12-15 MED ORDER — POLYETHYLENE GLYCOL 3350 17 G PO PACK: 17.0000 g | PACK | Freq: Every day | ORAL | Status: DC | PRN

## 2014-12-15 MED ORDER — CLOPIDOGREL BISULFATE 75 MG PO TABS
75.0000 mg | ORAL_TABLET | Freq: Every day | ORAL | Status: DC
  Administered 2014-12-15: 75 mg via ORAL
  Filled 2014-12-15: qty 1

## 2014-12-15 NOTE — H&P (Signed)
[place] at [place] NAME: Stacey Stone    MR#:  [id number]  DATE OF BIRTH:  09-19-1945  DATE OF ADMISSION:  12/15/2014  PRIMARY CARE PHYSICIAN: No primary care Amye Grego on file.   REQUESTING/REFERRING PHYSICIAN: Dr. Reita Cliche  CHIEF COMPLAINT:   Chief Complaint  Patient presents with  . Chest Pain  . Tachycardia    HISTORY OF PRESENT ILLNESS:  Stacey Stone  is a 69 y.o. female with a known history of coronary artery disease with a chronically occluded RCA and stable angina, obstructive sleep apnea on CPAP, hypertension, MALT lymphoma presents with chest pain and palpitations. She states that she has been in her usual state of health recently. This afternoon while watching television she developed acute onset central chest pain, palpitations, left jaw and left arm pain. She had no nausea, vomiting, diaphoresis or presyncope. She took one nitroglycerin tablet with no effect and then called EMS. On arrival to the ED she was in atrial fibrillation with a rate of 150 bpm soon after she spontaneously converted to normal sinus rhythm. She reports that she does have palpitations intermittently that last for seconds but has never been diagnosed with atrial fibrillation in the past. Initial troponin is negative. She is being admitted for further evaluation of new onset atrial fibrillation and also ACS rule out.  PAST MEDICAL HISTORY:   Past Medical History  Diagnosis Date  . Coronary artery disease T    1 artery 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 Mamie Nick. at [facility] in [place]  . No pertinent past medical history   . Sleep apnea t    O2- 2l at bedtime and BiPap @ bedtime  . Hypertension   . GERD (gastroesophageal reflux disease)   . Hypothyroidism   . Arthritis t    knee  . Hiatal hernia   . Depression   . MALT (mucosa associated lymphoid tissue) 07/05/2014    PAST SURGICAL HISTORY:   Past Surgical History  Procedure  Laterality Date  . Eye surgery  t    bil cataract 9/08  . Cholecystectomy  11/26    08/1970  . Joint replacement  02/15/2011    R Knee 2008  . Scleral buckle  02/16/2011    Procedure: SCLERAL BUCKLE;  Surgeon: Hayden Pedro, MD;  Location: [place];  Service: Ophthalmology;  Laterality: Left;  Scleral Buckle Left Eye with Headscope Laser    SOCIAL HISTORY:   Social History  Substance Use Topics  . Smoking status: Never Smoker   . Smokeless tobacco: Not on file  . Alcohol Use: No    FAMILY HISTORY:   Family History  Problem Relation Age of Onset  . Anesthesia problems Neg Hx   . Hypotension Neg Hx   . Malignant hyperthermia Neg Hx   . Pseudochol deficiency Neg Hx     DRUG ALLERGIES:   Allergies  Allergen Reactions  . Codeine Other (See Comments)    Stroke like symptoms    REVIEW OF SYSTEMS:   Review of Systems  Constitutional: Negative for fever, chills, weight loss and malaise/fatigue.  HENT: Negative for congestion and hearing loss.   Eyes: Negative for blurred vision and pain.  Respiratory: Negative for cough, hemoptysis, sputum production, shortness of breath and stridor.   Cardiovascular: Positive for chest pain, palpitations and leg swelling. Negative for orthopnea.  Gastrointestinal: Negative for nausea, vomiting, abdominal pain, diarrhea, constipation and blood in stool.  Genitourinary: Negative for dysuria and frequency.  Musculoskeletal: Negative for myalgias, back pain, joint pain and neck pain.  Skin: Negative for rash.  Neurological: Negative for focal weakness, loss of consciousness and headaches.  Endo/Heme/Allergies: Does not bruise/bleed easily.  Psychiatric/Behavioral: Negative for depression and hallucinations. The patient is not nervous/anxious.     MEDICATIONS AT HOME:   Prior to Admission medications   Medication Sig Start Date End Date Taking? Authorizing Jahvier Aldea  Acetaminophen 500 MG coapsule Take by mouth.    Historical Raahi Korber, MD   aspirin EC 81 MG tablet Take 81 mg by mouth daily.      Historical Gaila Engebretsen, MD  beta carotene w/minerals (OCUVITE) tablet Take 1 tablet by mouth 2 (two) times daily.      Historical Cabe Lashley, MD  calcium carbonate (TUMS EX) 750 MG chewable tablet Chew 1 tablet by mouth daily.     Historical Labrisha Wuellner, MD  celecoxib (CELEBREX) 200 MG capsule Take 200 mg by mouth daily.      Historical Cohl Behrens, MD  clopidogrel (PLAVIX) 75 MG tablet Take 75 mg by mouth daily.      Historical Monic Engelmann, MD  diltiazem (CARDIZEM CD) 240 MG 24 hr capsule Take 240 mg by mouth daily.      Historical Lori-Ann Lindfors, MD  gatifloxacin (ZYMAXID) 0.5 % SOLN Place 1 drop into the left eye 4 (four) times daily. Patient not taking: Reported on 11/06/2014 02/17/11   Hayden Pedro, MD  levothyroxine (SYNTHROID, LEVOTHROID) 150 MCG tablet Take 150 mcg by mouth daily.      Historical Annisha Baar, MD  metoprolol (TOPROL-XL) 100 MG 24 hr tablet Take 100 mg by mouth daily.      Historical Kerby Borner, MD  Multiple Vitamins-Minerals (MULTIVITAMINS THER. W/MINERALS) TABS Take 1 tablet by mouth daily.      Historical Eriq Hufford, MD  potassium chloride SA (K-DUR,KLOR-CON) 20 MEQ tablet Take 30 mEq by mouth daily.      Historical Saifan Rayford, MD  RABEprazole (ACIPHEX) 20 MG tablet Take 20 mg by mouth daily.      Historical Yanni Quiroa, MD  sertraline (ZOLOFT) 50 MG tablet Take 50 mg by mouth at bedtime.      Historical Jafari Mckillop, MD  simvastatin (ZOCOR) 40 MG tablet Take 40 mg by mouth at bedtime.      Historical Sherlon Nied, MD  torsemide (DEMADEX) 20 MG tablet Take 30 mg by mouth daily.      Historical Ulas Zuercher, MD  vitamin C (ASCORBIC ACID) 500 MG tablet Take 500 mg by mouth daily.      Historical Xee Hollman, MD      VITAL SIGNS:  Blood pressure 144/93, pulse 63, temperature 97.8 F (36.6 C), temperature source Oral, resp. rate 14, height 5\' 3"  (1.6 m), weight 86.183 kg (190 lb), SpO2 96 %.  PHYSICAL EXAMINATION:  GENERAL:  69 y.o.-year-old patient lying  in the bed with no acute distress.  EYES: Pupils equal, round, reactive to light and accommodation. No scleral icterus. Extraocular muscles intact.  HEENT: Head atraumatic, normocephalic. Oropharynx and nasopharynx clear.  NECK:  Supple, no jugular venous distention. No thyroid enlargement, no tenderness.  LUNGS: Normal breath sounds bilaterally, no wheezing, rales,rhonchi or crepitation. No use of accessory muscles of respiration.  CARDIOVASCULAR: S1, S2 normal. No murmurs, rubs, or gallops.  ABDOMEN: Soft, nontender, nondistended. Bowel sounds present. No organomegaly or mass. No guarding no rebound EXTREMITIES: +1 bilateral pedal edema, cyanosis, or clubbing. Peripheral pulses 2+  NEUROLOGIC: Cranial nerves II through XII are intact. Muscle strength 5/5 in all extremities. Sensation intact. Gait not checked.  PSYCHIATRIC: The patient is alert and oriented x 3. Calm SKIN: No obvious rash, lesion, or ulcer.   LABORATORY PANEL:   CBC  Recent Labs Lab 12/15/14 1401  WBC 7.2  HGB 13.3  HCT 40.7  PLT 197   ------------------------------------------------------------------------------------------------------------------  Chemistries   Recent Labs Lab 12/15/14 1401  NA 141  K 3.3*  CL 106  CO2 26  GLUCOSE 121*  BUN 14  CREATININE 0.87  CALCIUM 9.7  MG 2.4   ------------------------------------------------------------------------------------------------------------------  Cardiac Enzymes  Recent Labs Lab 12/15/14 1401  TROPONINI <0.03   ------------------------------------------------------------------------------------------------------------------  RADIOLOGY:  Dg Chest 2 View  12/15/2014   CLINICAL DATA:  Midsternal chest pain beginning at 12:30 p.m. today  EXAM: CHEST  2 VIEW  COMPARISON:  Single view of the chest and CT chest 02/05/2014.  FINDINGS: There is mild cardiomegaly without pulmonary edema. The lungs are clear. No pneumothorax or pleural effusion. No focal  bony abnormality.  IMPRESSION: Cardiomegaly without acute disease.   Electronically Signed   By: Inge Rise M.D.   On: 12/15/2014 14:26    EKG:   Orders placed or performed during the hospital encounter of 12/15/14  . EKG 12-Lead  . EKG 12-Lead  . ED EKG within 10 minutes  . ED EKG within 10 minutes    IMPRESSION AND PLAN:   #1 chest pain: Description suggestive of cardiac etiology with left jaw and left arm pain. Initial troponin negative. No concerning changes on EKG. Will admit to telemetry, cycle cardiac enzymes, obtain 2-D echocardiogram. Will continue aspirin, statin, beta blocker. [facility] consult cardiology.  #2 new-onset atrial fibrillation/possible paroxysmal atrial fibrillation: She has spontaneously converted into normal sinus rhythm. We'll admit to telemetry. Obtain 2-D echocardiogram. Her chads 2 score is 3 (hypertension, age, known CAD). Currently on aspirin at home. We'll defer anticoagulation to cardiology.  #3 hypertension: Blood pressure currently well controlled. Continue Cardizem, metoprolol, torsemide  #4 hypothyroidism: Continue Synthroid. TSH is normal.  #5 obstructive sleep apnea: Will have CPAP while inpatient  CODE STATUS: Full  TOTAL TIME TAKING CARE OF THIS PATIENT: 45 minutes.  Greater than 50% of time spent in coordination of care and counseling. Care plan discussed with the patient and her friend at the bedside also with emergency room physician.  Myrtis Ser M.D on 12/15/2014 at 4:00 PM  Between 7am to 6pm - Pager - [phone]  After 6pm go to [email or web address] - password [email or web address] [facility]  [facility] [place] [facility]  Office  [phone]  CC: Primary care physician; No primary care Ejay Lashley on file.

## 2014-12-15 NOTE — ED Notes (Signed)
Pt arrived via EMS from home. Started having chest pain around 1230 today.  Centralized chest pain and left jaw pain.  Per EMS she was showing afib on the monitor with periods of RVR. Highest her heart rate =150bpm but did not sustain. She does not have a history of Afib and is currently on cardizem for HTN.  Sees Dr. Josefa Half as her cardiologist. Was given 4 81mg  ASA with EMS which did provide some relief. She took 1 Nitro at home with minimal relief. Jaw pain initially was 10/10 and on arrival to ED is now a 2/10.

## 2014-12-15 NOTE — Consult Note (Signed)
[facility] Cardiology Consultation Note  Patient ID: Stacey Stone, MRN: [id number], DOB/AGE: 1945/04/25 69 y.o. Admit date: 12/15/2014   Date of Consult: 12/15/2014 Primary Physician: No primary care provider on file. Primary Cardiologist: Sim Boast O's  Chief Complaint:  Chief Complaint  Patient presents with  . Chest Pain  . Tachycardia   Reason for Consult: atrial fibrillation with rapid ventricular rate  HPI: 69 y.o. female with known coronary artery disease with previous occluded right coronary artery with appropriate collateralization and sleep apnea appropriately using her CPAP machine with essential hypertension who is recently had a change in her medication management due to bradycardia using metoprolol at 100 mg twice per day to 50 mg twice per day. At this time the patient had new onset of palpitations chest discomfort shortness of breath radiating into her jaw for approximately 10-15 minutes and was seen in the emergency room at that time for which she had atrial fibrillation with rapid ventricular rate. At that time she did receive medication management and therefore can use conversion to normal sinus rhythm. The patient had a normal troponin and normal EKG thereafter showing no evidence of myocardial infarction. She since then has done fairly well and feels fine. Not been on anticoagulation in the past and does not have any significant risk factors for treatment of anticoagulation  Past Medical History  Diagnosis Date  . Coronary artery disease T    1 artery 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 Mamie Nick. at [facility] in [place]  . No pertinent past medical history   . Sleep apnea t    O2- 2l at bedtime and BiPap @ bedtime  . Hypertension   . GERD (gastroesophageal reflux disease)   . Hypothyroidism   . Arthritis t    knee  . Hiatal hernia   . Depression   . 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 Eye surgery  t    bil cataract 9/08  . Cholecystectomy  11/26    08/1970  . Joint replacement  02/15/2011    R Knee 2008  . Scleral buckle  02/16/2011    Procedure: SCLERAL BUCKLE;  Surgeon: Hayden Pedro, MD;  Location: [facility];  Service: Ophthalmology;  Laterality: Left;  Scleral Buckle Left Eye with Headscope Laser     Home Meds: Prior to Admission medications   Medication Sig Start Date End Date Taking? Authorizing Provider  Acetaminophen 500 MG coapsule Take 1,000 mg by mouth every 4 (four) hours as needed for pain (for headache).    Yes Historical Provider, MD  aspirin EC 81 MG tablet Take 81 mg by mouth daily.     Yes Historical Provider, MD  celecoxib (CELEBREX) 200 MG capsule Take 200 mg by mouth daily.     Yes Historical Provider, MD  diltiazem (CARDIZEM CD) 240 MG 24 hr capsule Take 240 mg by mouth daily.     Yes Historical Provider, MD  Fluticasone-Salmeterol (ADVAIR) 100-50 MCG/DOSE AEPB Inhale 1 puff into the lungs 2 (two) times daily as needed (for asthma.).   Yes Historical Provider, MD  levothyroxine (SYNTHROID, LEVOTHROID) 150 MCG tablet Take 150 mcg by mouth daily.     Yes Historical Provider, MD  metoprolol (TOPROL-XL) 100 MG 24 hr tablet Take 50 mg by mouth 2 (two) times daily.    Yes Historical Provider, MD  nitroGLYCERIN (NITROSTAT) 0.4 MG SL tablet Place 0.4 mg under the  tongue every 5 (five) minutes as needed for chest pain. Maximum 3 doses, If no relief call md or 911.   Yes Historical Provider, MD  potassium chloride SA (K-DUR,KLOR-CON) 20 MEQ tablet Take 30 mEq by mouth daily.     Yes Historical Provider, MD  RABEprazole (ACIPHEX) 20 MG tablet Take 20 mg by mouth daily.     Yes Historical Provider, MD  sertraline (ZOLOFT) 50 MG tablet Take 50 mg by mouth at bedtime.     Yes Historical Provider, MD  simvastatin (ZOCOR) 40 MG tablet Take 40 mg by mouth at bedtime.     Yes Historical Provider, MD  torsemide (DEMADEX) 20 MG tablet Take  30 mg by mouth daily.     Yes Historical Provider, MD  gatifloxacin (ZYMAXID) 0.5 % SOLN Place 1 drop into the left eye 4 (four) times daily. Patient not taking: Reported on 11/06/2014 02/17/11   Hayden Pedro, MD    Inpatient Medications:  . heparin  5,000 Units Subcutaneous 3 times per day      Allergies:  Allergies  Allergen Reactions  . Codeine Other (See Comments)    Stroke like symptoms    Social History   Social History  . Marital Status: Married    Spouse Name: N/A  . Number of Children: N/A  . Years of Education: N/A   Occupational History  . Not on file.   Social History Main Topics  . Smoking status: Never Smoker   . Smokeless tobacco: Not on file  . Alcohol Use: No  . Drug Use: No  . Sexual Activity: Not on file   Other Topics Concern  . Not on file   Social History Narrative     Family History  Problem Relation Age of Onset  . Anesthesia problems Neg Hx   . Hypotension Neg Hx   . Malignant hyperthermia Neg Hx   . Pseudochol deficiency Neg Hx      Review of Systems Positive for chest pain shortness of breath and palpitation Negative for: General:  chills, fever, night sweats or weight changes.  Cardiovascular: PND orthopnea syncope dizziness  Dermatological skin lesions rashes Respiratory: Cough congestion Urologic: Frequent urination urination at night and hematuria Abdominal: negative for nausea, vomiting, diarrhea, bright red blood per rectum, melena, or hematemesis Neurologic: negative for visual changes, and/or hearing changes  All other systems reviewed and are otherwise negative except as noted above.  Labs:  Recent Labs  12/15/14 1401 12/15/14 1627  TROPONINI <0.03 <0.03   Lab Results  Component Value Date   WBC 7.2 12/15/2014   HGB 13.3 12/15/2014   HCT 40.7 12/15/2014   MCV 80.0 12/15/2014   PLT 197 12/15/2014    Recent Labs Lab 12/15/14 1401  NA 141  K 3.3*  CL 106  CO2 26  BUN 14  CREATININE 0.87  CALCIUM  9.7  GLUCOSE 121*   No results found for: CHOL, HDL, LDLCALC, TRIG No results found for: DDIMER  Radiology/Studies:  Dg Chest 2 View  12/15/2014   CLINICAL DATA:  Midsternal chest pain beginning at 12:30 p.m. today  EXAM: CHEST  2 VIEW  COMPARISON:  Single view of the chest and CT chest 02/05/2014.  FINDINGS: There is mild cardiomegaly without pulmonary edema. The lungs are clear. No pneumothorax or pleural effusion. No focal bony abnormality.  IMPRESSION: Cardiomegaly without acute disease.   Electronically Signed   By: Inge Rise M.D.   On: 12/15/2014 14:26    EKG: Atrial  fibrillation with rapid ventricular rate and nonspecific ST changes  Weights: Filed Weights   12/15/14 1355  Weight: 190 lb (86.183 kg)     Physical Exam: Blood pressure 126/79, pulse 49, temperature 98.2 F (36.8 C), temperature source Oral, resp. rate 18, height 5\' 3"  (1.6 m), weight 190 lb (86.183 kg), SpO2 95 %. Body mass index is 33.67 kg/(m^2). General: Well developed, well nourished, in no acute distress. Head eyes ears nose throat: Normocephalic, atraumatic, sclera non-icteric, no xanthomas, nares are without discharge. No apparent thyromegaly and/or mass  Lungs: Normal respiratory effort.  no wheezes, no rales, no rhonchi.  Heart: RRR with normal S1 S2. no murmur gallop, no rub, PMI is normal size and placement, carotid upstroke normal without bruit, jugular venous pressure is normal Abdomen: Soft, non-tender, non-distended with normoactive bowel sounds. No hepatomegaly. No rebound/guarding. No obvious abdominal masses. Abdominal aorta is normal size without bruit Extremities: No edema. no cyanosis, no clubbing, no ulcers  Peripheral : 2+ bilateral upper extremity pulses, 2+ bilateral femoral pulses, 2+ bilateral dorsal pedal pulse Neuro: Alert and oriented. No facial asymmetry. No focal deficit. Moves all extremities spontaneously. Musculoskeletal: Normal muscle tone without kyphosis Psych:   Responds to questions appropriately with a normal affect.    Assessment: 69 year old female with essential hypertension coronary artery disease sleep apnea with the acute nonvalvular atrial fibrillation with rapid ventricular rate now spontaneously converted to normal sinus rhythm without myocardial infarction  Plan: 1. Continue and replace metoprolol at 50 mg bid possible increased dose to 50 mg 3 times per day to follow closely for any significant side effects but will remain in normal sinus rhythm 2. Consider anticoagulation due to high CHA DS score and concerns of recurrence of atrial fibrillation due to cardiovascular disease age and sleep apnea 3. Aggressive treatment of sleep apnea and CPAP machine and be no more diligent with his use 4. No further cardiac intervention at this time or diagnostic testing due to maintenance of normal sinus rhythm and no evidence of myocardial infarction 5. Possible discharged home tomorrow with outpatient adjustments of medications  Signed, Corey Skains M.D. [facility] 12/15/2014, 6:45 PM

## 2014-12-15 NOTE — Progress Notes (Addendum)
Report received from ED RN Amber. Pt. admitted to unit, rm 243. Oriented to room, call bell, Ascom phones and staff. Bed in low position. Fall safety plan reviewed, contract signed and placed on wall, brown non-skid socks in place. Full assessment to Epic; no complaints of chest pain; HR bradycardic in the 50s. Education on atrial fibrillation and stroke prevention provided both verbally and via handouts. Will continue to monitor.  Skin verified with Elna Breslow, RN.

## 2014-12-15 NOTE — ED Provider Notes (Signed)
[facility] Emergency Department Xavion Muscat Note   ____________________________________________  Time seen: 3:10 PM I have reviewed the triage vital signs and the triage nursing note.  HISTORY  Chief Complaint Chest Pain and Tachycardia   Historian Patient  HPI Stacey Stone is a 69 y.o. female who has a history of coronary artery disease, and hypertension who follows with cardiologist Dr. Tomasita Crumble shows an primary care physician Dr. Emily Filbert, is presenting for episode of chest pain. She has no history of atrial fibrillation or congestive heart failure that she knows of. Patient was at home at rest when she developed central left-sided sharp pain that extended up into the left jaw and down the left arm. She knows that her heart was racing at the same time. She called EMS who gave her 4 baby aspirin. She was found have intermittent atrial fibrillation with rapid ventricular response with heart rate of 150 by EMS and upon arrival.This was intermittent back to sinus rhythm in the 60s. Patient denies having irregular heartbeat in the past other than a few seconds. She's never had a chest or chest pain that lasted this long. Chest pain lasted several minutes.  Patient states she has a 100% blocked artery which already has some collaterals based on prior catheter report.  Chest pain or shortness denied 10. Currently its 0 out of 10.    Past Medical History  Diagnosis Date  . Coronary artery disease T    1 artery 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 Mamie Nick. at [facility] in [place]  . No pertinent past medical history   . Sleep apnea t    O2- 2l at bedtime and BiPap @ bedtime  . Hypertension   . GERD (gastroesophageal reflux disease)   . Hypothyroidism   . Arthritis t    knee  . Hiatal hernia   . Depression   . 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 Aortic valve defect 11/06/2014  .  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 Obstructive apnea 01/02/2014  . Angina at rest 06/16/2013  . BP (high blood pressure) 06/16/2013  . Hypercholesteremia 06/16/2013  . Adult hypothyroidism 06/16/2013  . 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 CAD S/P percutaneous coronary angioplasty 01/08/2006    Past Surgical History  Procedure Laterality Date  . Eye surgery  t    bil cataract 9/08  . Cholecystectomy  11/26    08/1970  . Joint replacement  02/15/2011    R Knee 2008  . Scleral buckle  02/16/2011    Procedure: SCLERAL BUCKLE;  Surgeon: Hayden Pedro, MD;  Location: [facility];  Service: Ophthalmology;  Laterality: Left;  Scleral Buckle Left Eye with Headscope Laser    Current Outpatient Rx  Name  Route  Sig  Dispense  Refill  . Acetaminophen 500 MG coapsule   Oral   Take by mouth.         Marland Kitchen aspirin EC 81 MG tablet   Oral   Take 81 mg by mouth daily.           . beta carotene w/minerals (OCUVITE) tablet   Oral   Take 1 tablet by mouth 2 (two) times daily.           . calcium carbonate (TUMS EX) 750 MG chewable tablet   Oral   Chew 1 tablet by mouth daily.          Marland Kitchen  celecoxib (CELEBREX) 200 MG capsule   Oral   Take 200 mg by mouth daily.           . clopidogrel (PLAVIX) 75 MG tablet   Oral   Take 75 mg by mouth daily.           Marland Kitchen diltiazem (CARDIZEM CD) 240 MG 24 hr capsule   Oral   Take 240 mg by mouth daily.           . Fluticasone-Salmeterol (ADVAIR DISKUS) 250-50 MCG/DOSE AEPB   Inhalation   Inhale into the lungs.         Marland Kitchen gatifloxacin (ZYMAXID) 0.5 % SOLN   Left Eye   Place 1 drop into the left eye 4 (four) times daily. Patient not taking: Reported on 11/06/2014         . levothyroxine (SYNTHROID, LEVOTHROID) 150 MCG tablet   Oral   Take 150 mcg by mouth daily.           . metoprolol (TOPROL-XL) 100 MG 24 hr tablet   Oral   Take 100 mg by mouth  daily.           . Multiple Vitamins-Minerals (MULTIVITAMINS THER. W/MINERALS) TABS   Oral   Take 1 tablet by mouth daily.           . potassium chloride SA (K-DUR,KLOR-CON) 20 MEQ tablet   Oral   Take 30 mEq by mouth daily.           . RABEprazole (ACIPHEX) 20 MG tablet   Oral   Take 20 mg by mouth daily.           . sertraline (ZOLOFT) 50 MG tablet   Oral   Take 50 mg by mouth at bedtime.           . simvastatin (ZOCOR) 40 MG tablet   Oral   Take 40 mg by mouth at bedtime.           . torsemide (DEMADEX) 20 MG tablet   Oral   Take 30 mg by mouth daily.           . vitamin C (ASCORBIC ACID) 500 MG tablet   Oral   Take 500 mg by mouth daily.             Allergies Codeine  Family History  Problem Relation Age of Onset  . Anesthesia problems Neg Hx   . Hypotension Neg Hx   . Malignant hyperthermia Neg Hx   . Pseudochol deficiency Neg Hx     Social History Social History  Substance Use Topics  . Smoking status: Never Smoker   . Smokeless tobacco: None  . Alcohol Use: No    Review of Systems  Constitutional: Negative for fever. Eyes: Negative for visual changes. ENT: Negative for sore throat. Cardiovascular: Positive for chest pain as per history of present illness Respiratory: Positive for shortness of breath during episode of heart racing and chest pain. None now. Gastrointestinal: Negative for abdominal pain, vomiting and diarrhea. Genitourinary: Negative for dysuria. Musculoskeletal: Negative for back pain. Skin: Negative for rash. Neurological: Negative for headache. 10 point Review of Systems otherwise negative ____________________________________________   PHYSICAL EXAM:  VITAL SIGNS: ED Triage Vitals  Enc Vitals Group     BP 12/15/14 1355 148/99 mmHg     Pulse Rate 12/15/14 1400 52     Resp 12/15/14 1355 16     Temp 12/15/14 1355 97.8 F (36.6 C)  Temp Source 12/15/14 1355 Oral     SpO2 12/15/14 1355 98 %     Weight  12/15/14 1355 190 lb (86.183 kg)     Height 12/15/14 1355 5\' 3"  (1.6 m)     Head Cir --      Peak Flow --      Pain Score 12/15/14 1356 2     Pain Loc --      Pain Edu? --      Excl. in [place]? --      Constitutional: Alert and oriented. Well appearing and in no distress. Eyes: Conjunctivae are normal. PERRL. Normal extraocular movements. ENT   Head: Normocephalic and atraumatic.   Nose: No congestion/rhinnorhea.   Mouth/Throat: Mucous membranes are moist.   Neck: No stridor. Cardiovascular/Chest: Bradycardic, regular rhythm.  No murmurs, rubs, or gallops. Respiratory: Normal respiratory effort without tachypnea nor retractions. Breath sounds are clear and equal bilaterally. No wheezes/rales/rhonchi. Gastrointestinal: Soft. No distention, no guarding, no rebound. Nontender   Genitourinary/rectal:Deferred Musculoskeletal: Nontender with normal range of motion in all extremities. No joint effusions.  No lower extremity tenderness. 2+ extremity edema bilaterally. No calf tenderness. Neurologic:  Normal speech and language. No gross or focal neurologic deficits are appreciated. Skin:  Skin is warm, dry and intact. No rash noted. Psychiatric: Mood and affect are normal. Speech and behavior are normal. Patient exhibits appropriate insight and judgment.  ____________________________________________   EKG I, Lisa Roca, MD, the attending physician have personally viewed and interpreted all ECGs.  13:53:128 bpm. Atrial fibrillation with rapid ventricular response. Narrow QRS. Normal axis. Nonspecific ST and T-wave. ____________________________________________  LABS (pertinent positives/negatives)  Basic metabolic panel significant for potassium 3.3 and otherwise within normal limits White blood cell count 7.2, hemoglobin 13.3 and platelet count 197 Troponin less than 0.03 TSH 0.417  ____________________________________________  RADIOLOGY All Xrays were viewed by  me. Imaging interpreted by Radiologist.  Chest x-ray two-view:  Cardiomegaly without acute disease __________________________________________  PROCEDURES  Procedure(s) performed: None  Critical Care performed: None  ____________________________________________   ED COURSE / ASSESSMENT AND PLAN  CONSULTATIONS: Face-to-face with hospitalist for admission.  Pertinent labs & imaging results that were available during my care of the patient were reviewed by me and considered in my medical decision making (see chart for details).   The patient with a history of coronary artery disease had an episode of chest pain with associated symptoms worrisome for cardiac chest pain. This was in the setting of new onset atrial fibrillation with rapid ventricular response.  Patient did spontaneously convert to normal sinus rhythm with a heart rate around 63 here in the emergency department prior to my evaluating the patient. At this point in time she is chest pain-free.  Patient did receive aspirin 325 prior to arrival, and her EKG showed no clear evidence of this to make changes, with initial troponin negative. I suspect most likely the chest discomfort was related to the rapid ventricular response supply demand mismatch rather than new acute coronary occlusion.  However given the patient's risk factors, and new-onset atrial fibrillation episode, patient will be admitted to the hospital for further evaluation and management.  Patient / Family / Caregiver informed of clinical course, medical decision-making process, and agree with plan.    ___________________________________________   FINAL CLINICAL IMPRESSION(S) / ED DIAGNOSES   Final diagnoses:  New onset atrial fibrillation  Atrial fibrillation with rapid ventricular response  Left sided chest pain       Lisa Roca, MD 12/15/14 1534

## 2014-12-16 ENCOUNTER — Observation Stay: Admit: 2014-12-16 | Payer: 59

## 2014-12-16 LAB — BASIC METABOLIC PANEL
Anion gap: 7 (ref 5–15)
BUN: 14 mg/dL (ref 6–20)
CALCIUM: 8.7 mg/dL — AB (ref 8.9–10.3)
CHLORIDE: 108 mmol/L (ref 101–111)
CO2: 24 mmol/L (ref 22–32)
CREATININE: 0.97 mg/dL (ref 0.44–1.00)
GFR calc Af Amer: >60 mL/min (ref >60)
GFR calc non Af Amer: 58 mL/min — ABNORMAL LOW (ref >60)
Glucose, Bld: 98 mg/dL (ref 65–99)
Potassium: 3.3 mmol/L — ABNORMAL LOW (ref 3.5–5.1)
Sodium: 139 mmol/L (ref 135–145)

## 2014-12-16 LAB — CBC
HEMATOCRIT: 37.6 % (ref 35.0–47.0)
HEMOGLOBIN: 12.2 g/dL (ref 12.0–16.0)
MCH: 26 pg (ref 26.0–34.0)
MCHC: 32.4 g/dL (ref 32.0–36.0)
MCV: 80.1 fL (ref 80.0–100.0)
Platelets: 184 10*3/uL (ref 150–440)
RBC: 4.69 MIL/uL (ref 3.80–5.20)
RDW: 16 % — AB (ref 11.5–14.5)
WBC: 7.2 10*3/uL (ref 3.6–11.0)

## 2014-12-16 LAB — TROPONIN I: Troponin I: <0.03 ng/mL (ref <0.031)

## 2014-12-16 MED ORDER — APIXABAN 5 MG PO TABS: 5.0000 mg | ORAL_TABLET | Freq: Two times a day (BID) | ORAL | Status: DC

## 2014-12-16 MED ORDER — APIXABAN 5 MG PO TABS
5.0000 mg | ORAL_TABLET | Freq: Two times a day (BID) | ORAL | Status: DC
  Administered 2014-12-16: 5 mg via ORAL
  Filled 2014-12-16: qty 1

## 2014-12-16 MED ORDER — POTASSIUM CHLORIDE CRYS ER 20 MEQ PO TBCR: 40.0000 meq | EXTENDED_RELEASE_TABLET | Freq: Once | ORAL | Status: DC

## 2014-12-16 NOTE — Progress Notes (Signed)
[facility] Encounter Note  Patient: Stacey Stone / Admit Date: 12/15/2014 / Date of Encounter: 12/16/2014, 7:25 AM   Subjective: No further episode of atrial fibrillation or irregular heartbeat chest pain or shortness of breath No further evidence of myocardial infarction or other cardiac symptoms  Review of Systems: Positive for: Sleep apnea Negative for: Vision change, hearing change, syncope, dizziness, nausea, vomiting,diarrhea, bloody stool, stomach pain, cough, congestion, diaphoresis, urinary frequency, urinary pain,skin lesions, skin rashes Others previously listed  Objective: Telemetry: Normal sinus rhythm Physical Exam: Blood pressure 123/68, pulse 50, temperature 98 F (36.7 C), temperature source Oral, resp. rate 16, height 5\' 3"  (1.6 m), weight 190 lb (86.183 kg), SpO2 95 %. Body mass index is 33.67 kg/(m^2). General: Well developed, well nourished, in no acute distress. Head: Normocephalic, atraumatic, sclera non-icteric, no xanthomas, nares are without discharge. Neck: No apparent masses Lungs: Normal respirations with few wheezes, no rhonchi, no rales , no crackles   Heart: Regular rate and rhythm, normal S1 S2, no murmur, no rub, no gallop, PMI is normal size and placement, carotid upstroke normal without bruit, jugular venous pressure normal Abdomen: Soft, non-tender, non-distended with normoactive bowel sounds. No hepatosplenomegaly. Abdominal aorta is normal size without bruit Extremities: No edema, no clubbing, no cyanosis, no ulcers,  Peripheral: 2+ radial, 2+ femoral, 2+ dorsal pedal pulses Neuro: Alert and oriented. Moves all extremities spontaneously. Psych:  Responds to questions appropriately with a normal affect.   Intake/Output Summary (Last 24 hours) at 12/16/14 0725 Last data filed at 12/16/14 0022  Gross per 24 hour  Intake      0 ml  Output    100 ml  Net   -100 ml    Inpatient Medications:  . apixaban  5 mg Oral  BID  . aspirin EC  81 mg Oral Daily  . levothyroxine  150 mcg Oral QAC breakfast  . pantoprazole  40 mg Oral Daily  . potassium chloride  40 mEq Oral Once  . sertraline  50 mg Oral Daily  . simvastatin  40 mg Oral QHS   Infusions:    Labs:  Recent Labs  12/15/14 1401 12/16/14 0243  NA 141 139  K 3.3* 3.3*  CL 106 108  CO2 26 24  GLUCOSE 121* 98  BUN 14 14  CREATININE 0.87 0.97  CALCIUM 9.7 8.7*  MG 2.4  --   PHOS 2.4*  --    No results for input(s): AST, ALT, ALKPHOS, BILITOT, PROT, ALBUMIN in the last 72 hours.  Recent Labs  12/15/14 1401 12/16/14 0243  WBC 7.2 7.2  HGB 13.3 12.2  HCT 40.7 37.6  MCV 80.0 80.1  PLT 197 184    Recent Labs  12/15/14 1401 12/15/14 1627 12/15/14 2242 12/16/14 0243  TROPONINI <0.03 <0.03 <0.03 <0.03   Invalid input(s): POCBNP No results for input(s): HGBA1C in the last 72 hours.   Weights: Filed Weights   12/15/14 1355  Weight: 190 lb (86.183 kg)     Radiology/Studies:  Dg Chest 2 View  12/15/2014   CLINICAL DATA:  Midsternal chest pain beginning at 12:30 p.m. today  EXAM: CHEST  2 VIEW  COMPARISON:  Single view of the chest and CT chest 02/05/2014.  FINDINGS: There is mild cardiomegaly without pulmonary edema. The lungs are clear. No pneumothorax or pleural effusion. No focal bony abnormality.  IMPRESSION: Cardiomegaly without acute disease.   Electronically Signed   By: Inge Rise M.D.   On: 12/15/2014 14:26  Assessment and Recommendation  69 y.o. female with essential hypertension coronary artery disease status post occlusion of right coronary artery and sleep apnea with acute onset of atrial fibrillation nonvalvular in nature with rapid ventricular rate with now spontaneous conversion to normal sinus rhythm multifactorial in nature including coronary artery disease with angina most consistent with collateral flow to the right coronary artery rather than myocardial infarction 1. Continue metoprolol although keep  decreased dose at 50 mg either twice or 3 times per day depending on heart rate control 2. Extra S2 to the patient that sleep apnea is possibly driving the concerns of atrial fibrillation 3. Anticoagulation for further risk reduction in stroke with atrial fibrillation until further control X 4. No further cardiac intervention of the coronary artery disease stable at this time   5. Okay for discharge to home with follow-up next week  Signed, Serafina Royals M.D. FACC

## 2014-12-16 NOTE — Discharge Instructions (Signed)

## 2014-12-16 NOTE — Discharge Summary (Signed)
Stacey Stone, is a 69 y.o. female  DOB Aug 10, 1945  MRN [id number].  Admission date:  12/15/2014  Admitting Physician  Aldean Jewett, MD  Discharge Date:  12/16/2014    Admission Diagnosis  New onset atrial fibrillation [I48.91] Atrial fibrillation with rapid ventricular response [I48.91] Left sided chest pain [R07.9]  Discharge Diagnoses   Paroxysmal atrial fibrillation, resolved Coronary artery disease Sleep apnea GERD Hypothyroidism Depression   Past Medical History  Diagnosis Date  . Coronary artery disease T    1 artery 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 Mamie Nick. at [facility] in [place]  . No pertinent past medical history   . Sleep apnea t    O2- 2l at bedtime and BiPap @ bedtime  . Hypertension   . GERD (gastroesophageal reflux disease)   . Hypothyroidism   . Arthritis t    knee  . Hiatal hernia   . Depression   . 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 Eye surgery  t    bil cataract 9/08  . Cholecystectomy  11/26    08/1970  . Joint replacement  02/15/2011    R Knee 2008  . Scleral buckle  02/16/2011    Procedure: SCLERAL BUCKLE;  Surgeon: Hayden Pedro, MD;  Location: [facility];  Service: Ophthalmology;  Laterality: Left;  Scleral Buckle Left Eye with Headscope Laser       History of present illness and  Hospital Course:     Kindly see H&P for history of present illness and admission details, please review complete Labs, Consult reports and Test reports for all details in brief  HPI  from the history and physical done on the day of admission    Hospital Course    Patient was admitted post auto conversion and remained in sinus bradycardia. She had no more chest discomfort or jaw pain. Cardiac enzymes normal 4, chest x-ray clear. No anginal symptoms recently. In light of high chads score  Eliquis started and will continue with aspirin. Patient remained in bradycardia and when she gets home will take her diltiazem today and resume her metoprolol tomorrow if her heart rate is above 60, avoiding bradycardia. In the future if she feels the tachycardia she will perform Valsalva maneuver and take an extra metoprolol.   Discharge Condition: Stable  Follow UP  Dr. Sabra Heck as scheduled    Discharge Instructions  and  Discharge Medications   Eliquis 5 mg twice a day Aspirin 81 mg daily Diltiazem 240 mg daily Advair 100/50 1 puff twice a day Synthroid 150 g daily Toprol-XL 100 mg half tab twice a day Nitrostat when necessary   Klor-Con 30 meq daily AcipHex 20 mg daily Sertraline 50 mg daily Simvastatin 40 mg at bedtime Torsemide 30 mg daily   Today   Subjective:   Stacey Stone today has no headache,no chest abdominal pain,no new weakness tingling or numbness, feels much better wants to go home today.   Objective:  Blood pressure 123/68, pulse 50, temperature 98 F (36.7 C), temperature source Oral, resp. rate 16, height 5\' 3"  (1.6 m), weight 86.183 kg (190 lb), SpO2 95 %.   Exam Awake Alert, Oriented x 3, No new F.N deficits, Normal affect [place].AT,PERRAL Supple Neck,No JVD, No cervical lymphadenopathy appriciated.  Symmetrical Chest wall movement, Good air movement bilaterally, CTAB RRR,No Gallops,Rubs or new Murmurs, Abd Soft, Non tender, No rebound. No Cyanosis, Clubbing or edema, No new Rash or bruise  Total Time in preparing paper work, data evaluation and todays exam - 35 minutes  MILLER,MARK F. M.D on 12/16/2014 at 6:58 AM

## 2014-12-16 NOTE — Progress Notes (Signed)
Pt. D/c home with daughter. Denies c/o pain or discomfort. Discharge teaching with teach back. IV access removed without difficulty.

## 2014-12-16 NOTE — Care Management Note (Signed)
Case Management Note  Patient Details  Name: Stacey Stone MRN: [id number] Date of Birth: [birth year]  Subjective/Objective:         Provided [name] with a 78 Day Free coupon for Eliquis and instructed her to present it to her pharmacist along with her prescription for Eliquis. .            Action/Plan:   Expected Discharge Date:                  Expected Discharge Plan:     In-House Referral:     Discharge planning Services     Post Acute Care Choice:    Choice offered to:     DME Arranged:    DME Agency:     HH Arranged:    [place] Agency:     Status of Service:     Medicare Important Message Given:    Date Medicare IM Given:    Medicare IM give by:    Date Additional Medicare IM Given:    Additional Medicare Important Message give by:     If discussed at [place] of Stay Meetings, dates discussed:    Additional Comments:  Erbie Arment A, RN 12/16/2014, 11:10 AM

## 2015-01-15 ENCOUNTER — Other Ambulatory Visit: Payer: Self-pay | Admitting: Internal Medicine

## 2015-01-15 DIAGNOSIS — E559 Vitamin D deficiency, unspecified: Secondary | ICD-10-CM | POA: Insufficient documentation

## 2015-01-15 DIAGNOSIS — E538 Deficiency of other specified B group vitamins: Secondary | ICD-10-CM | POA: Insufficient documentation

## 2015-01-15 DIAGNOSIS — Z1231 Encounter for screening mammogram for malignant neoplasm of breast: Secondary | ICD-10-CM

## 2015-01-22 ENCOUNTER — Encounter (INDEPENDENT_AMBULATORY_CARE_PROVIDER_SITE_OTHER): Payer: 59 | Admitting: Ophthalmology

## 2015-01-22 DIAGNOSIS — H43813 Vitreous degeneration, bilateral: Secondary | ICD-10-CM | POA: Diagnosis not present

## 2015-01-22 DIAGNOSIS — H34812 Central retinal vein occlusion, left eye, with macular edema: Secondary | ICD-10-CM | POA: Diagnosis not present

## 2015-01-22 DIAGNOSIS — H338 Other retinal detachments: Secondary | ICD-10-CM

## 2015-01-22 DIAGNOSIS — H35033 Hypertensive retinopathy, bilateral: Secondary | ICD-10-CM | POA: Diagnosis not present

## 2015-01-22 DIAGNOSIS — H348312 Tributary (branch) retinal vein occlusion, right eye, stable: Secondary | ICD-10-CM

## 2015-01-22 DIAGNOSIS — I10 Essential (primary) hypertension: Secondary | ICD-10-CM | POA: Diagnosis not present

## 2015-02-17 ENCOUNTER — Encounter (INDEPENDENT_AMBULATORY_CARE_PROVIDER_SITE_OTHER): Payer: 59 | Admitting: Ophthalmology

## 2015-02-17 DIAGNOSIS — H35033 Hypertensive retinopathy, bilateral: Secondary | ICD-10-CM | POA: Diagnosis not present

## 2015-02-17 DIAGNOSIS — H43813 Vitreous degeneration, bilateral: Secondary | ICD-10-CM

## 2015-02-17 DIAGNOSIS — H34812 Central retinal vein occlusion, left eye, with macular edema: Secondary | ICD-10-CM | POA: Diagnosis not present

## 2015-02-17 DIAGNOSIS — H348312 Tributary (branch) retinal vein occlusion, right eye, stable: Secondary | ICD-10-CM | POA: Diagnosis not present

## 2015-02-17 DIAGNOSIS — I10 Essential (primary) hypertension: Secondary | ICD-10-CM | POA: Diagnosis not present

## 2015-02-17 DIAGNOSIS — H4311 Vitreous hemorrhage, right eye: Secondary | ICD-10-CM

## 2015-02-19 ENCOUNTER — Ambulatory Visit
Admission: RE | Admit: 2015-02-19 | Discharge: 2015-02-19 | Disposition: A | Discharge status: Home / Self Care | Payer: 59 | Source: Ambulatory Visit | Attending: Internal Medicine | Admitting: Internal Medicine

## 2015-02-19 ENCOUNTER — Emergency Department
Admission: EM | Admit: 2015-02-19 | Discharge: 2015-02-19 | Disposition: A | Discharge status: Home / Self Care | Payer: 59 | Attending: Emergency Medicine | Admitting: Emergency Medicine

## 2015-02-19 DIAGNOSIS — R002 Palpitations: Secondary | ICD-10-CM | POA: Diagnosis present

## 2015-02-19 DIAGNOSIS — I10 Essential (primary) hypertension: Secondary | ICD-10-CM | POA: Diagnosis not present

## 2015-02-19 DIAGNOSIS — I4891 Unspecified atrial fibrillation: Secondary | ICD-10-CM | POA: Diagnosis not present

## 2015-02-19 DIAGNOSIS — Z79899 Other long term (current) drug therapy: Secondary | ICD-10-CM | POA: Diagnosis not present

## 2015-02-19 DIAGNOSIS — Z1231 Encounter for screening mammogram for malignant neoplasm of breast: Secondary | ICD-10-CM | POA: Diagnosis present

## 2015-02-19 DIAGNOSIS — E876 Hypokalemia: Secondary | ICD-10-CM | POA: Diagnosis not present

## 2015-02-19 DIAGNOSIS — Z7901 Long term (current) use of anticoagulants: Secondary | ICD-10-CM | POA: Insufficient documentation

## 2015-02-19 DIAGNOSIS — Z7982 Long term (current) use of aspirin: Secondary | ICD-10-CM | POA: Diagnosis not present

## 2015-02-19 LAB — CBC
HEMATOCRIT: 40.4 % (ref 35.0–47.0)
HEMOGLOBIN: 13.2 g/dL (ref 12.0–16.0)
MCH: 26.9 pg (ref 26.0–34.0)
MCHC: 32.7 g/dL (ref 32.0–36.0)
MCV: 82.2 fL (ref 80.0–100.0)
Platelets: 232 10*3/uL (ref 150–440)
RBC: 4.92 MIL/uL (ref 3.80–5.20)
RDW: 16.2 % — ABNORMAL HIGH (ref 11.5–14.5)
WBC: 9.2 10*3/uL (ref 3.6–11.0)

## 2015-02-19 LAB — BASIC METABOLIC PANEL
ANION GAP: 13 (ref 5–15)
BUN: 27 mg/dL — ABNORMAL HIGH (ref 6–20)
CALCIUM: 9.4 mg/dL (ref 8.9–10.3)
CO2: 27 mmol/L (ref 22–32)
Chloride: 98 mmol/L — ABNORMAL LOW (ref 101–111)
Creatinine, Ser: 1.88 mg/dL — ABNORMAL HIGH (ref 0.44–1.00)
GFR, EST AFRICAN AMERICAN: 30 mL/min — AB (ref >60)
GFR, EST NON AFRICAN AMERICAN: 26 mL/min — AB (ref >60)
GLUCOSE: 146 mg/dL — AB (ref 65–99)
POTASSIUM: 2.7 mmol/L — AB (ref 3.5–5.1)
Sodium: 138 mmol/L (ref 135–145)

## 2015-02-19 LAB — TROPONIN I

## 2015-02-19 MED ORDER — POTASSIUM CHLORIDE 20 MEQ PO PACK: 40.0000 meq | PACK | Freq: Two times a day (BID) | ORAL | Status: DC

## 2015-02-19 MED ORDER — DILTIAZEM HCL 25 MG/5ML IV SOLN
10.0000 mg | Freq: Once | INTRAVENOUS | Status: AC
  Administered 2015-02-19: 10 mg via INTRAVENOUS

## 2015-02-19 MED ORDER — SODIUM CHLORIDE 0.9 % IV BOLUS (SEPSIS)
1000.0000 mL | Freq: Once | INTRAVENOUS | Status: AC
  Administered 2015-02-19: 1000 mL via INTRAVENOUS

## 2015-02-19 MED ORDER — POTASSIUM CHLORIDE CRYS ER 20 MEQ PO TBCR
EXTENDED_RELEASE_TABLET | ORAL | Status: AC
  Administered 2015-02-19: 40 meq via ORAL
  Filled 2015-02-19: qty 2

## 2015-02-19 MED ORDER — POTASSIUM CHLORIDE CRYS ER 20 MEQ PO TBCR
40.0000 meq | EXTENDED_RELEASE_TABLET | Freq: Once | ORAL | Status: AC
  Administered 2015-02-19: 40 meq via ORAL

## 2015-02-19 MED ORDER — DILTIAZEM HCL 25 MG/5ML IV SOLN
5.0000 mg | Freq: Once | INTRAVENOUS | Status: AC
  Administered 2015-02-19: 5 mg via INTRAVENOUS

## 2015-02-19 NOTE — ED Notes (Signed)
Critical K+ result of 2.7 called to this RN. MD made aware. Orders to be placed for Potassium 59mEq PO now.

## 2015-02-19 NOTE — Discharge Instructions (Signed)
Atrial Fibrillation °Atrial fibrillation is a type of irregular or rapid heartbeat (arrhythmia). In atrial fibrillation, the heart quivers continuously in a chaotic pattern. This occurs when parts of the heart receive disorganized signals that make the heart unable to pump blood normally. This can increase the risk for stroke, heart failure, and other heart-related conditions. There are different types of atrial fibrillation, including: °· Paroxysmal atrial fibrillation. This type starts suddenly, and it usually stops on its own shortly after it starts. °· Persistent atrial fibrillation. This type often lasts longer than a week. It may stop on its own or with treatment. °· Long-lasting persistent atrial fibrillation. This type lasts longer than 12 months. °· Permanent atrial fibrillation. This type does not go away. °Talk with your health care provider to learn about the type of atrial fibrillation that you have. °CAUSES °This condition is caused by some heart-related conditions or procedures, including: °· A heart attack. °· Coronary artery disease. °· Heart failure. °· Heart valve conditions. °· High blood pressure. °· Inflammation of the sac that surrounds the heart (pericarditis). °· Heart surgery. °· Certain heart rhythm disorders, such as Wolf-Parkinson-White syndrome. °Other causes include: °· Pneumonia. °· Obstructive sleep apnea. °· Blockage of an artery in the lungs (pulmonary embolism, or PE). °· Lung cancer. °· Chronic lung disease. °· Thyroid problems, especially if the thyroid is overactive (hyperthyroidism). °· Caffeine. °· Excessive alcohol use or illegal drug use. °· Use of some medicines, including certain decongestants and diet pills. °Sometimes, the cause cannot be found. °RISK FACTORS °This condition is more likely to develop in: °· People who are older in age. °· People who smoke. °· People who have diabetes mellitus. °· People who are overweight (obese). °· Athletes who exercise  vigorously. °SYMPTOMS °Symptoms of this condition include: °· A feeling that your heart is beating rapidly or irregularly. °· A feeling of discomfort or pain in your chest. °· Shortness of breath. °· Sudden light-headedness or weakness. °· Getting tired easily during exercise. °In some cases, there are no symptoms. °DIAGNOSIS °Your health care provider may be able to detect atrial fibrillation when taking your pulse. If detected, this condition may be diagnosed with: °· An electrocardiogram (ECG). °· A Holter monitor test that records your heartbeat patterns over a 24-hour period. °· Transthoracic echocardiogram (TTE) to evaluate how blood flows through your heart. °· Transesophageal echocardiogram (TEE) to view more detailed images of your heart. °· A stress test. °· Imaging tests, such as a CT scan or chest X-ray. °· Blood tests. °TREATMENT °The main goals of treatment are to prevent blood clots from forming and to keep your heart beating at a normal rate and rhythm. The type of treatment that you receive depends on many factors, such as your underlying medical conditions and how you feel when you are experiencing atrial fibrillation. °This condition may be treated with: °· Medicine to slow down the heart rate, bring the heart's rhythm back to normal, or prevent clots from forming. °· Electrical cardioversion. This is a procedure that resets your heart's rhythm by delivering a controlled, low-energy shock to the heart through your skin. °· Different types of ablation, such as catheter ablation, catheter ablation with pacemaker, or surgical ablation. These procedures destroy the heart tissues that send abnormal signals. When the pacemaker is used, it is placed under your skin to help your heart beat in a regular rhythm. °HOME CARE INSTRUCTIONS °· Take over-the counter and prescription medicines only as told by your health care provider. °·   If your health care provider prescribed a blood-thinning medicine  (anticoagulant), take it exactly as told. Taking too much blood-thinning medicine can cause bleeding. If you do not take enough blood-thinning medicine, you will not have the protection that you need against stroke and other problems.  Do not use tobacco products, including cigarettes, chewing tobacco, and e-cigarettes. If you need help quitting, ask your health care provider.  If you have obstructive sleep apnea, manage your condition as told by your health care provider.  Do not drink alcohol.  Do not drink beverages that contain caffeine, such as coffee, soda, and tea.  Maintain a healthy weight. Do not use diet pills unless your health care provider approves. Diet pills may make heart problems worse.  Follow diet instructions as told by your health care provider.  Exercise regularly as told by your health care provider.  Keep all follow-up visits as told by your health care provider. This is important. PREVENTION  Avoid drinking beverages that contain caffeine or alcohol.  Avoid certain medicines, especially medicines that are used for breathing problems.  Avoid certain herbs and herbal medicines, such as those that contain ephedra or ginseng.  Do not use illegal drugs, such as cocaine and amphetamines.  Do not smoke.  Manage your high blood pressure. SEEK MEDICAL CARE IF:  You notice a change in the rate, rhythm, or strength of your heartbeat.  You are taking an anticoagulant and you notice increased bruising.  You tire more easily when you exercise or exert yourself. SEEK IMMEDIATE MEDICAL CARE IF:  You have chest pain, abdominal pain, sweating, or weakness.  You feel nauseous.  You notice blood in your vomit, bowel movement, or urine.  You have shortness of breath.  You suddenly have swollen feet and ankles.  You feel dizzy.  You have sudden weakness or numbness of the face, arm, or leg, especially on one side of the body.  You have trouble speaking,  trouble understanding, or both (aphasia).  Your face or your eyelid droops on one side. These symptoms may represent a serious problem that is an emergency. Do not wait to see if the symptoms will go away. Get medical help right away. Call your local emergency services (911 in the [place] Do not drive yourself to the hospital.   This information is not intended to replace advice given to you by your health care provider. Make sure you discuss any questions you have with your health care provider.   Document Released: 03/08/2005 Document Revised: 11/27/2014 Document Reviewed: 07/03/2014 Elsevier Interactive Patient Education 2016 [employer].  Hypokalemia Hypokalemia means that the amount of potassium in the blood is lower than normal.Potassium is a chemical, called an electrolyte, that helps regulate the amount of fluid in the body. It also stimulates muscle contraction and helps nerves function properly.Most of the body's potassium is inside of cells, and only a very small amount is in the blood. Because the amount in the blood is so small, minor changes can be life-threatening. CAUSES  Antibiotics.  Diarrhea or vomiting.  Using laxatives too much, which can cause diarrhea.  Chronic kidney disease.  Water pills (diuretics).  Eating disorders (bulimia).  Low magnesium level.  Sweating a lot. SIGNS AND SYMPTOMS  Weakness.  Constipation.  Fatigue.  Muscle cramps.  Mental confusion.  Skipped heartbeats or irregular heartbeat (palpitations).  Tingling or numbness. DIAGNOSIS  Your health care provider can diagnose hypokalemia with blood tests. In addition to checking your potassium level, your health care  provider may also check other lab tests. TREATMENT Hypokalemia can be treated with potassium supplements taken by mouth or adjustments in your current medicines. If your potassium level is very low, you may need to get potassium through a vein (IV) and be monitored in  the hospital. A diet high in potassium is also helpful. Foods high in potassium are:  Nuts, such as peanuts and pistachios.  Seeds, such as sunflower seeds and pumpkin seeds.  Peas, lentils, and lima beans.  Whole grain and bran cereals and breads.  Fresh fruit and vegetables, such as apricots, avocado, bananas, cantaloupe, kiwi, oranges, tomatoes, asparagus, and potatoes.  Orange and tomato juices.  Red meats.  Fruit yogurt. HOME CARE INSTRUCTIONS  Take all medicines as prescribed by your health care provider.  Maintain a healthy diet by including nutritious food, such as fruits, vegetables, nuts, whole grains, and lean meats.  If you are taking a laxative, be sure to follow the directions on the label. SEEK MEDICAL CARE IF:  Your weakness gets worse.  You feel your heart pounding or racing.  You are vomiting or having diarrhea.  You are diabetic and having trouble keeping your blood glucose in the normal range. SEEK IMMEDIATE MEDICAL CARE IF:  You have chest pain, shortness of breath, or dizziness.  You are vomiting or having diarrhea for more than 2 days.  You faint. MAKE SURE YOU:   Understand these instructions.  Will watch your condition.  Will get help right away if you are not doing well or get worse.   This information is not intended to replace advice given to you by your health care provider. Make sure you discuss any questions you have with your health care provider.   Document Released: 03/08/2005 Document Revised: 03/29/2014 Document Reviewed: 09/08/2012 Elsevier Interactive Patient Education [employer].

## 2015-02-19 NOTE — ED Notes (Signed)
Heart fluttering hx of afib.

## 2015-02-19 NOTE — ED Provider Notes (Signed)
[facility] Emergency Department Provider Note  ____________________________________________  Time seen: 12:45 AM  I have reviewed the triage vital signs and the nursing notes.   HISTORY  Chief Complaint Palpitations    HPI Stacey Stone is a 69 y.o. female with history of atrial fibrillation presents with complaint of acute onset of "heart fluttering"ultimately one hour before presentation. Patient denies any chest pain no shortness of breath no dizziness no nausea, vomiting or diaphoresis.     Past Medical History  Diagnosis Date  . Coronary artery disease T    1 artery 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 Mamie Nick. at [facility] in [place]  . No pertinent past medical history   . Sleep apnea t    O2- 2l at bedtime and BiPap @ bedtime  . Hypertension   . GERD (gastroesophageal reflux disease)   . Hypothyroidism   . Arthritis t    knee  . Hiatal hernia   . Depression   . 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 Atrial fibrillation, new onset ([place]) 12/15/2014  . Chest pain 12/15/2014  . Aortic valve defect 11/06/2014  . 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 MALT (mucosa associated lymphoid tissue) ([place]) 07/05/2014  . Obstructive apnea 01/02/2014  . Angina at rest [facility]) 06/16/2013  . BP (high blood pressure) 06/16/2013  . Hypercholesteremia 06/16/2013  . Adult hypothyroidism 06/16/2013  . 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 CAD S/P percutaneous coronary angioplasty 01/08/2006    Past Surgical History  Procedure Laterality Date  . Eye surgery  t    bil cataract 9/08  . Cholecystectomy  11/26    08/1970  . Joint replacement  02/15/2011    R Knee 2008  . Scleral buckle  02/16/2011    Procedure: SCLERAL BUCKLE;  Surgeon: Hayden Pedro, MD;  Location: [facility];  Service: Ophthalmology;  Laterality: Left;  Scleral Buckle Left Eye with  Headscope Laser    Current Outpatient Rx  Name  Route  Sig  Dispense  Refill  . Acetaminophen 500 MG coapsule   Oral   Take 1,000 mg by mouth every 4 (four) hours as needed for pain (for headache).          Marland Kitchen apixaban (ELIQUIS) 5 MG TABS tablet   Oral   Take 1 tablet (5 mg total) by mouth 2 (two) times daily.   60 tablet   1   . aspirin EC 81 MG tablet   Oral   Take 81 mg by mouth daily.           Marland Kitchen diltiazem (CARDIZEM CD) 240 MG 24 hr capsule   Oral   Take 240 mg by mouth daily.           . Fluticasone-Salmeterol (ADVAIR) 100-50 MCG/DOSE AEPB   Inhalation   Inhale 1 puff into the lungs 2 (two) times daily as needed (for asthma.).         Marland Kitchen gatifloxacin (ZYMAXID) 0.5 % SOLN   Left Eye   Place 1 drop into the left eye 4 (four) times daily. Patient not taking: Reported on 11/06/2014         . levothyroxine (SYNTHROID, LEVOTHROID) 150 MCG tablet   Oral   Take 150 mcg by mouth daily.           . metoprolol (TOPROL-XL) 100 MG 24 hr tablet   Oral   Take  50 mg by mouth 2 (two) times daily.          . nitroGLYCERIN (NITROSTAT) 0.4 MG SL tablet   Sublingual   Place 0.4 mg under the tongue every 5 (five) minutes as needed for chest pain. Maximum 3 doses, If no relief call md or 911.         . potassium chloride SA (K-DUR,KLOR-CON) 20 MEQ tablet   Oral   Take 30 mEq by mouth daily.           . RABEprazole (ACIPHEX) 20 MG tablet   Oral   Take 20 mg by mouth daily.           . sertraline (ZOLOFT) 50 MG tablet   Oral   Take 50 mg by mouth at bedtime.           . simvastatin (ZOCOR) 40 MG tablet   Oral   Take 40 mg by mouth at bedtime.           . torsemide (DEMADEX) 20 MG tablet   Oral   Take 30 mg by mouth daily.             Allergies Codeine  Family History  Problem Relation Age of Onset  . Anesthesia problems Neg Hx   . Hypotension Neg Hx   . Malignant hyperthermia Neg Hx   . Pseudochol deficiency Neg Hx     Social  History Social History  Substance Use Topics  . Smoking status: Never Smoker   . Smokeless tobacco: Not on file  . Alcohol Use: No    Review of Systems  Constitutional: Negative for fever. Eyes: Negative for visual changes. ENT: Negative for sore throat. Cardiovascular: Negative for chest pain. Positive for palpitations Respiratory: Negative for shortness of breath. Gastrointestinal: Negative for abdominal pain, vomiting and diarrhea. Genitourinary: Negative for dysuria. Musculoskeletal: Negative for back pain. Skin: Negative for rash. Neurological: Negative for headaches, focal weakness or numbness.   10-point ROS otherwise negative.  ____________________________________________   PHYSICAL EXAM:  VITAL SIGNS: ED Triage Vitals  Enc Vitals Group     BP 02/19/15 0012 139/108 mmHg     Pulse Rate 02/19/15 0012 152     Resp 02/19/15 0012 18     Temp 02/19/15 0012 98.1 F (36.7 C)     Temp Source 02/19/15 0012 Oral     SpO2 02/19/15 0012 92 %     Weight 02/19/15 0012 188 lb (85.276 kg)     Height 02/19/15 0012 5\' 3"  (1.6 m)     Head Cir --      Peak Flow --      Pain Score 02/19/15 0012 0     Pain Loc --      Pain Edu? --      Excl. in [place]? --      Constitutional: Alert and oriented. Well appearing and in no distress. Eyes: Conjunctivae are normal. PERRL. Normal extraocular movements. ENT   Head: Normocephalic and atraumatic.   Nose: No congestion/rhinnorhea.   Mouth/Throat: Mucous membranes are moist.   Neck: No stridor. Hematological/Lymphatic/Immunilogical: No cervical lymphadenopathy. Cardiovascular: Tachycardia, irregular rhythm. Normal and symmetric distal pulses are present in all extremities. No murmurs, rubs, or gallops. Respiratory: Normal respiratory effort without tachypnea nor retractions. Breath sounds are clear and equal bilaterally. No wheezes/rales/rhonchi. Gastrointestinal: Soft and nontender. No distention. There is no CVA  tenderness. Genitourinary: deferred Musculoskeletal: Nontender with normal range of motion in all extremities. No joint effusions.  No  lower extremity tenderness nor edema. Neurologic:  Normal speech and language. No gross focal neurologic deficits are appreciated. Speech is normal.  Skin:  Skin is warm, dry and intact. No rash noted. Psychiatric: Mood and affect are normal. Speech and behavior are normal. Patient exhibits appropriate insight and judgment.  ____________________________________________    LABS (pertinent positives/negatives)  Labs Reviewed  BASIC METABOLIC PANEL - Abnormal; Notable for the following:    Potassium 2.7 (*)    Chloride 98 (*)    Glucose, Bld 146 (*)    BUN 27 (*)    Creatinine, Ser 1.88 (*)    GFR calc non Af Amer 26 (*)    GFR calc Af Amer 30 (*)    All other components within normal limits  CBC - Abnormal; Notable for the following:    RDW 16.2 (*)    All other components within normal limits  TROPONIN I  TROPONIN I     ____________________________________________   EKG  ED ECG REPORT I, [place], [place], the attending physician, personally viewed and interpreted this ECG.   Date: 02/19/2015  EKG Time: 12:15 AM  Rate: 153  Rhythm: Atrial fibrillation rapid ventricular response  Axis: none  Intervals: Normal  ST&T Change: None     INITIAL IMPRESSION / ASSESSMENT AND PLAN / ED COURSE  Pertinent labs & imaging results that were available during my care of the patient were reviewed by me and considered in my medical decision making (see chart for details).  Patient received diltiazem 10 mg followed by 50 mg a complete resolution of rapid ventricular response. Lab data revealed that the patient is hypokalemic with a potassium of 2.7 a such she received 40 mEq by mouth followed by an additional 40 mEq by mouth  ____________________________________________   FINAL CLINICAL IMPRESSION(S) / ED DIAGNOSES  Final diagnoses:  Atrial  fibrillation with rapid ventricular response ([place])  Hypokalemia      Gregor Hams, MD 02/19/15 [id number]

## 2015-03-05 ENCOUNTER — Encounter (INDEPENDENT_AMBULATORY_CARE_PROVIDER_SITE_OTHER): Payer: 59 | Admitting: Ophthalmology

## 2015-03-05 DIAGNOSIS — H43813 Vitreous degeneration, bilateral: Secondary | ICD-10-CM

## 2015-03-05 DIAGNOSIS — H4311 Vitreous hemorrhage, right eye: Secondary | ICD-10-CM

## 2015-03-05 DIAGNOSIS — H35033 Hypertensive retinopathy, bilateral: Secondary | ICD-10-CM | POA: Diagnosis not present

## 2015-03-05 DIAGNOSIS — H348312 Tributary (branch) retinal vein occlusion, right eye, stable: Secondary | ICD-10-CM | POA: Diagnosis not present

## 2015-03-05 DIAGNOSIS — H34812 Central retinal vein occlusion, left eye, with macular edema: Secondary | ICD-10-CM

## 2015-03-05 DIAGNOSIS — I10 Essential (primary) hypertension: Secondary | ICD-10-CM

## 2015-03-07 ENCOUNTER — Encounter (INDEPENDENT_AMBULATORY_CARE_PROVIDER_SITE_OTHER): Payer: 59 | Admitting: Ophthalmology

## 2015-03-07 DIAGNOSIS — H34812 Central retinal vein occlusion, left eye, with macular edema: Secondary | ICD-10-CM

## 2015-03-13 ENCOUNTER — Encounter (INDEPENDENT_AMBULATORY_CARE_PROVIDER_SITE_OTHER): Payer: 59 | Admitting: Ophthalmology

## 2015-03-13 DIAGNOSIS — H34812 Central retinal vein occlusion, left eye, with macular edema: Secondary | ICD-10-CM

## 2015-04-17 ENCOUNTER — Encounter (INDEPENDENT_AMBULATORY_CARE_PROVIDER_SITE_OTHER): Payer: 59 | Admitting: Ophthalmology

## 2015-04-24 ENCOUNTER — Encounter (INDEPENDENT_AMBULATORY_CARE_PROVIDER_SITE_OTHER): Payer: 59 | Admitting: Ophthalmology

## 2015-04-24 DIAGNOSIS — H338 Other retinal detachments: Secondary | ICD-10-CM

## 2015-04-24 DIAGNOSIS — I10 Essential (primary) hypertension: Secondary | ICD-10-CM

## 2015-04-24 DIAGNOSIS — H43813 Vitreous degeneration, bilateral: Secondary | ICD-10-CM

## 2015-04-24 DIAGNOSIS — H34812 Central retinal vein occlusion, left eye, with macular edema: Secondary | ICD-10-CM

## 2015-04-24 DIAGNOSIS — H348312 Tributary (branch) retinal vein occlusion, right eye, stable: Secondary | ICD-10-CM

## 2015-04-24 DIAGNOSIS — H35033 Hypertensive retinopathy, bilateral: Secondary | ICD-10-CM

## 2015-04-24 DIAGNOSIS — H4311 Vitreous hemorrhage, right eye: Secondary | ICD-10-CM | POA: Diagnosis not present

## 2015-04-25 NOTE — H&P (Signed)
Stacey Stone is a 70 y.o.scheduled for myosure resection of an endometrial mass measuring 2.5 x 2.0 cm .pt with PMB . EMBX shows proliferative . S/P myosure 2014 resection .    Past Medical History:  has a past medical history of Anemia, unspecified; Angina, class III ([place]); Aortic valve disorders; CAD (coronary artery disease); Hypersomnia with sleep apnea, unspecified; Hypertension; Non Hodgkin's lymphoma ([place]); OSA on CPAP (01/02/2014); Other and unspecified hyperlipidemia; and Thyroid disease.  Past Surgical History:  has a past surgical history that includes Cardiac catheterization; Cholecystectomy; partial knee replacement (07/2006); uterine fibroid (04/2012); Dilation and curettage, diagnostic / therapeutic; Dilation and curettage of uterus; and Myosure with resection of submucosal fibroid. Family History: family history includes Breast cancer in her mother; Coronary artery disease in her brother and father. Social History:  reports that she has never smoked. She has never used smokeless tobacco. She reports that she does not drink alcohol. OB/GYN History:  OB History    Gravida Para Term Preterm AB TAB SAB Ectopic Multiple Living   3 2 2  1  1   2       Allergies: is allergic to codeine. Medications:  Current Outpatient Prescriptions:  . acetaminophen (TYLENOL) 500 mg capsule, Take 500 mg by mouth every 4 (four) hours as needed for Fever., Disp: , Rfl:  . apixaban (ELIQUIS) 5 mg tablet, Take 1 tablet (5 mg total) by mouth 2 (two) times daily., Disp: 60 tablet, Rfl: 11 . aspirin 81 MG EC tablet, Take 81 mg by mouth once daily., Disp: , Rfl:  . diltiazem (CARDIZEM CD) 240 MG CD capsule, TAKE 1 CAPSULE BY MOUTH ONCE DAILY., Disp: 30 capsule, Rfl: 11 . fluticasone-salmeterol (ADVAIR DISKUS) 100-50 mcg/dose diskus inhaler, Inhale 1 inhalation into the lungs every 12 (twelve) hours., Disp: 1 Inhaler, Rfl: 11 . isosorbide mononitrate (IMDUR) 30 MG ER tablet, Take 1 tablet (30 mg total)  by mouth once daily., Disp: 30 tablet, Rfl: 11 . levothyroxine (SYNTHROID, LEVOTHROID) 150 MCG tablet, TAKE 1 TABLET BY MOUTH EACH MORNING FOR THYROID. BEST ON EMPTY STOMACH., Disp: 30 tablet, Rfl: 11 . losartan-hydrochlorothiazide (HYZAAR) 50-12.5 mg tablet, Take 1 tablet by mouth once daily., Disp: 30 tablet, Rfl: 11 . metoprolol succinate (TOPROL-XL) 100 MG XL tablet, Take 50 mg by mouth every evening., Disp: , Rfl:  . potassium chloride (K-DUR,KLOR-CON) 20 MEQ ER tablet, Take 20 mEq by mouth 2 (two) times daily., Disp: , Rfl:  . progesterone (PROMETRIUM) 200 MG capsule, Take 1 capsule (200 mg total) by mouth nightly., Disp: 30 capsule, Rfl: 0 . RABEprazole (ACIPHEX) 20 mg EC tablet, TAKE 1 TABLET BY MOUTH ONCE DAILY., Disp: 30 tablet, Rfl: 11 . sertraline (ZOLOFT) 50 MG tablet, TAKE 1 TABLET BY MOUTH ONCE DAILY., Disp: 30 tablet, Rfl: 5 . simvastatin (ZOCOR) 40 MG tablet, TAKE 1 TABLET BY MOUTH EVERY NIGHT., Disp: 30 tablet, Rfl: 11 . TORsemide (DEMADEX) 20 MG tablet, TAKE 1 AND 1/2 TABLETS BY MOUTH ONCE DAILY FOR EDEMA., Disp: 45 tablet, Rfl: 11  Review of Systems: General:   No fatigue or weight loss Eyes:   No vision changes Ears:   No hearing difficulty Respiratory:   No cough or shortness of breath Pulmonary:   No asthma or shortness of breath Cardiovascular:  No chest pain, palpitations, dyspnea on exertion Gastrointestinal:  No abdominal bloating, chronic diarrhea, constipations, masses, pain or hematochezia Genitourinary:  No hematuria, dysuria, abnormal vaginal discharge, pelvic pain, Menometrorrhagia Lymphatic:  No swollen lymph nodes Musculoskeletal: No muscle weakness Neurologic:  No extremity weakness, syncope, seizure disorder Psychiatric:  No history of depression, delusions or suicidal/homicidal ideation   Exam:   Vitals:   04/09/15 1027  BP: 107/68  Pulse: 56    Body mass index is 33.48 kg/(m^2).  WDWN white/ female in NAD  Lungs: CTA  CV : RRR  without murmur   Neck: no thyromegaly Abdomen: soft , no mass, normal active bowel sounds, non-tender, no rebound tenderness Pelvic: tanner stage 5 ,  External genitalia: vulva /labia no lesions Urethra: no prolapse Vagina: normal physiologic d/c Cervix: no lesions, no cervical motion tenderness  Uterus: normal size shape and contour, non-tender Saline infusion sonohysterography: betadine prep to the cervix followed by placement of the HSG catheter into the endometrial canal . Sterile H2O is injected while performing a transvaginal u/s . Findings:2.5 x 2.0 cm endometrial mass( round) c/w fibroid.  Impression:   The encounter diagnosis was Endometrial mass., negative bx.appears to be a fibroid     Plan:  myosure  Resection of endometrial mass Preop clearance done by Emily Filbert MD  Stop asa and eliquis 7 days before surgery   Caroline Sauger, MD       Instructions      Return if symptoms worsen or fail to improve.  Order-Level Documents:  There are no order-level documents.  Encounter-Level Documents:  There are no encounter-level documents.  Additional Documentation  Vitals:    BP 107/68    Pulse 56    Wt 85.7 kg (189 lb)    BMI 33.48 kg/m2    BSA 1.95 m2   Flowsheets:    Custom Formula Data,    Anthropometrics      Encounter Info:    Classic Report,    Allergies,    Care Plan,    Education,    History,    Anticoag,    Billing,    Dialysis History,    Med Rec,    Patient Screenings,    Patient Entered Data,    Smoking Audit    .Marland Kitchen.(6 more)  Orders Placed    None  Medication Changes     None    Medication List  Visit Diagnoses      Endometrial mass N94.89    Problem List

## 2015-04-28 ENCOUNTER — Encounter
Admission: RE | Admit: 2015-04-28 | Discharge: 2015-04-28 | Disposition: A | Discharge status: Home / Self Care | Payer: 59 | Source: Ambulatory Visit | Attending: Obstetrics and Gynecology | Admitting: Obstetrics and Gynecology

## 2015-04-28 DIAGNOSIS — Z01812 Encounter for preprocedural laboratory examination: Secondary | ICD-10-CM | POA: Diagnosis not present

## 2015-04-28 LAB — BASIC METABOLIC PANEL
ANION GAP: 7 (ref 5–15)
BUN: 30 mg/dL — AB (ref 6–20)
CHLORIDE: 101 mmol/L (ref 101–111)
CO2: 32 mmol/L (ref 22–32)
Calcium: 9.5 mg/dL (ref 8.9–10.3)
Creatinine, Ser: 1.76 mg/dL — ABNORMAL HIGH (ref 0.44–1.00)
GFR calc Af Amer: 32 mL/min — ABNORMAL LOW (ref >60)
GFR calc non Af Amer: 28 mL/min — ABNORMAL LOW (ref >60)
GLUCOSE: 115 mg/dL — AB (ref 65–99)
POTASSIUM: 3.4 mmol/L — AB (ref 3.5–5.1)
Sodium: 140 mmol/L (ref 135–145)

## 2015-04-28 LAB — CBC
HEMATOCRIT: 37.2 % (ref 35.0–47.0)
HEMOGLOBIN: 12.4 g/dL (ref 12.0–16.0)
MCH: 28 pg (ref 26.0–34.0)
MCHC: 33.5 g/dL (ref 32.0–36.0)
MCV: 83.8 fL (ref 80.0–100.0)
Platelets: 240 10*3/uL (ref 150–440)
RBC: 4.44 MIL/uL (ref 3.80–5.20)
RDW: 15.5 % — AB (ref 11.5–14.5)
WBC: 9 10*3/uL (ref 3.6–11.0)

## 2015-04-28 NOTE — Patient Instructions (Signed)
  Your procedure is scheduled on: May 05, 2015 (Monday) Report to Day Surgery.[facility]) Second Floor To find out your arrival time please call [phone] between 1PM - 3PM on May 02, 2015 (Friday).  Remember: Instructions that are not followed completely may result in serious medical risk, up to and including death, or upon the discretion of your surgeon and anesthesiologist your surgery may need to be rescheduled.    __x 1. Do not eat food or drink liquids after midnight. No gum chewing or hard candies.     __x__ 2. No Alcohol for 24 hours before or after surgery.   ____ 3. Bring all medications with you on the day of surgery if instructed.    ___x_ 4. Notify your doctor if there is any change in your medical condition     (cold, fever, infections).     Do not wear jewelry, make-up, hairpins, clips or nail polish.  Do not wear lotions, powders, or perfumes. You may wear deodorant.  Do not shave 48 hours prior to surgery. Men may shave face and neck.  Do not bring valuables to the hospital.    [facility] is not responsible for any belongings or valuables.               Contacts, dentures or bridgework may not be worn into surgery.  Leave your suitcase in the car. After surgery it may be brought to your room.  For patients admitted to the hospital, discharge time is determined by your                treatment team.   Patients discharged the day of surgery will not be allowed to drive home.   Please read over the following fact sheets that you were given:   Surgical Site Infection Prevention   ____ Take these medicines the morning of surgery with A SIP OF WATER:    1. Aciphex (Aciphex at bedtime on February 12)   2.Diltiazem  3Metoprolol  4.Iosorbide Mononitrate  ____ [employer] (as directed)   ___x. Use CHG Soap as directed  __x__ Use inhalers on the day of surgery (Use Advair inhaler the morning of surgery and bring inhaler to hospital the day of  surgery)  ____ Stop metformin 2 days prior to surgery    ____ Take 1/2 of usual insulin dose the night before surgery and none on the morning of surgery.   __x__ Stop Coumadin/Plavix/aspirin on (Stop Aspirin and Eliquis now per Dr. Jerilynn Mages. Miller--PCP)  _x___ Stop Anti-inflammatories on (NO NSAIDS)   _x_ Stop supplements until after surgery. (Stop Vitamin B-12, and Vitamin D plus omega now)   __x__ Bring C-Pap to the hospital.

## 2015-04-28 NOTE — Pre-Procedure Instructions (Signed)
       See Note Vent Rate (bpm) 54   PR Interval (msec) 226   QRS Interval (msec) 82   QT Interval (msec) 436   QTc (msec) 413    Result Narrative  Sinus bradycardia 1st degree AV block Nonspecific ST abnormality Abnormal ECG No previous ECGs available I reviewed and concur with this report. Electronically signed [id number] MD, Shambaugh ([id number]) on 04/23/2015 6:17:07 PM

## 2015-04-29 LAB — TYPE AND SCREEN
ABO/RH(D): O NEG
Antibody Screen: NEGATIVE

## 2015-04-29 LAB — ABO/RH: ABO/RH(D): O NEG

## 2015-05-05 ENCOUNTER — Encounter
Admission: RE | Disposition: A | Discharge status: Home / Self Care | Payer: Self-pay | Source: Ambulatory Visit | Attending: Obstetrics and Gynecology

## 2015-05-05 ENCOUNTER — Ambulatory Visit: Payer: Commercial Managed Care - HMO | Admitting: Anesthesiology

## 2015-05-05 ENCOUNTER — Ambulatory Visit
Admission: RE | Admit: 2015-05-05 | Discharge: 2015-05-05 | Disposition: A | Discharge status: Home / Self Care | Payer: Commercial Managed Care - HMO | Source: Ambulatory Visit | Attending: Obstetrics and Gynecology | Admitting: Obstetrics and Gynecology

## 2015-05-05 ENCOUNTER — Encounter: Payer: Self-pay | Admitting: *Deleted

## 2015-05-05 DIAGNOSIS — Z803 Family history of malignant neoplasm of breast: Secondary | ICD-10-CM | POA: Insufficient documentation

## 2015-05-05 DIAGNOSIS — G4730 Sleep apnea, unspecified: Secondary | ICD-10-CM | POA: Diagnosis not present

## 2015-05-05 DIAGNOSIS — Z8572 Personal history of non-Hodgkin lymphomas: Secondary | ICD-10-CM | POA: Insufficient documentation

## 2015-05-05 DIAGNOSIS — N8500 Endometrial hyperplasia, unspecified: Secondary | ICD-10-CM | POA: Insufficient documentation

## 2015-05-05 DIAGNOSIS — N9489 Other specified conditions associated with female genital organs and menstrual cycle: Secondary | ICD-10-CM | POA: Diagnosis present

## 2015-05-05 DIAGNOSIS — Z7982 Long term (current) use of aspirin: Secondary | ICD-10-CM | POA: Insufficient documentation

## 2015-05-05 DIAGNOSIS — I10 Essential (primary) hypertension: Secondary | ICD-10-CM | POA: Insufficient documentation

## 2015-05-05 DIAGNOSIS — Z9889 Other specified postprocedural states: Secondary | ICD-10-CM | POA: Insufficient documentation

## 2015-05-05 DIAGNOSIS — E079 Disorder of thyroid, unspecified: Secondary | ICD-10-CM | POA: Insufficient documentation

## 2015-05-05 DIAGNOSIS — Z7901 Long term (current) use of anticoagulants: Secondary | ICD-10-CM | POA: Insufficient documentation

## 2015-05-05 DIAGNOSIS — I2510 Atherosclerotic heart disease of native coronary artery without angina pectoris: Secondary | ICD-10-CM | POA: Diagnosis not present

## 2015-05-05 DIAGNOSIS — G4733 Obstructive sleep apnea (adult) (pediatric): Secondary | ICD-10-CM | POA: Insufficient documentation

## 2015-05-05 DIAGNOSIS — Z8249 Family history of ischemic heart disease and other diseases of the circulatory system: Secondary | ICD-10-CM | POA: Insufficient documentation

## 2015-05-05 DIAGNOSIS — Z9049 Acquired absence of other specified parts of digestive tract: Secondary | ICD-10-CM | POA: Diagnosis not present

## 2015-05-05 DIAGNOSIS — G4710 Hypersomnia, unspecified: Secondary | ICD-10-CM | POA: Diagnosis not present

## 2015-05-05 DIAGNOSIS — N950 Postmenopausal bleeding: Secondary | ICD-10-CM | POA: Insufficient documentation

## 2015-05-05 DIAGNOSIS — Z885 Allergy status to narcotic agent status: Secondary | ICD-10-CM | POA: Insufficient documentation

## 2015-05-05 HISTORY — PX: DILATATION & CURETTAGE/HYSTEROSCOPY WITH MYOSURE: SHX6511

## 2015-05-05 SURGERY — DILATATION & CURETTAGE/HYSTEROSCOPY WITH MYOSURE
Anesthesia: General | Site: Uterus | Wound class: Clean Contaminated

## 2015-05-05 MED ORDER — GLYCOPYRROLATE 0.2 MG/ML IJ SOLN
INTRAMUSCULAR | Status: DC | PRN
  Administered 2015-05-05: .2 mg via INTRAVENOUS

## 2015-05-05 MED ORDER — CEFOXITIN SODIUM-DEXTROSE 2-2.2 GM-% IV SOLR (PREMIX)
2.0000 g | INTRAVENOUS | Status: AC
  Administered 2015-05-05: 2000 mg via INTRAVENOUS

## 2015-05-05 MED ORDER — EPHEDRINE SULFATE 50 MG/ML IJ SOLN
INTRAMUSCULAR | Status: DC | PRN
  Administered 2015-05-05: 10 mg via INTRAVENOUS

## 2015-05-05 MED ORDER — CEFOXITIN SODIUM-DEXTROSE 2-2.2 GM-% IV SOLR (PREMIX)
INTRAVENOUS | Status: AC
  Administered 2015-05-05: 2000 mg via INTRAVENOUS
  Filled 2015-05-05: qty 50

## 2015-05-05 MED ORDER — LIDOCAINE HCL (CARDIAC) 20 MG/ML IV SOLN
INTRAVENOUS | Status: DC | PRN
  Administered 2015-05-05: 40 mg via INTRAVENOUS

## 2015-05-05 MED ORDER — MEPERIDINE HCL 50 MG PO TABS: 50.0000 mg | ORAL_TABLET | Freq: Once | ORAL | Status: DC

## 2015-05-05 MED ORDER — DEXAMETHASONE SODIUM PHOSPHATE 10 MG/ML IJ SOLN
INTRAMUSCULAR | Status: DC | PRN
  Administered 2015-05-05: 8 mg via INTRAVENOUS

## 2015-05-05 MED ORDER — LACTATED RINGERS IV SOLN
INTRAVENOUS | Status: DC
  Administered 2015-05-05 (×2): via INTRAVENOUS

## 2015-05-05 MED ORDER — ACETAMINOPHEN 500 MG PO TABS: 1000.0000 mg | ORAL_TABLET | Freq: Once | ORAL | Status: DC

## 2015-05-05 MED ORDER — PROPOFOL 10 MG/ML IV BOLUS
INTRAVENOUS | Status: DC | PRN
  Administered 2015-05-05: 150 mg via INTRAVENOUS
  Administered 2015-05-05: 50 mg via INTRAVENOUS

## 2015-05-05 MED ORDER — PROPOFOL 10 MG/ML IV BOLUS: INTRAVENOUS | Status: DC | PRN

## 2015-05-05 MED ORDER — MIDAZOLAM HCL 2 MG/2ML IJ SOLN
INTRAMUSCULAR | Status: DC | PRN
  Administered 2015-05-05: 2 mg via INTRAVENOUS

## 2015-05-05 MED ORDER — SILVER NITRATE-POT NITRATE 75-25 % EX MISC
CUTANEOUS | Status: AC
  Filled 2015-05-05: qty 4

## 2015-05-05 MED ORDER — FENTANYL CITRATE (PF) 100 MCG/2ML IJ SOLN
INTRAMUSCULAR | Status: DC | PRN
  Administered 2015-05-05: 1 ug via INTRAVENOUS
  Administered 2015-05-05: 50 ug via INTRAVENOUS

## 2015-05-05 SURGICAL SUPPLY — 19 items
CANISTER SUC SOCK COL 7IN (MISCELLANEOUS) ×3 IMPLANT
CANISTER SUCT 3000ML (MISCELLANEOUS) ×3 IMPLANT
CATH ROBINSON RED A/P 16FR (CATHETERS) ×3 IMPLANT
GLOVE BIO SURGEON STRL SZ8 (GLOVE) ×3 IMPLANT
GOWN STRL REUS W/ TWL LRG LVL3 (GOWN DISPOSABLE) ×1 IMPLANT
GOWN STRL REUS W/ TWL XL LVL3 (GOWN DISPOSABLE) ×1 IMPLANT
GOWN STRL REUS W/TWL LRG LVL3 (GOWN DISPOSABLE) ×2
GOWN STRL REUS W/TWL XL LVL3 (GOWN DISPOSABLE) ×2
KIT RM TURNOVER CYSTO AR (KITS) ×3 IMPLANT
MYOSURE LITE POLYP REMOVAL (MISCELLANEOUS) IMPLANT
PACK DNC HYST (MISCELLANEOUS) ×3 IMPLANT
PAD OB MATERNITY 4.3X12.25 (PERSONAL CARE ITEMS) ×3 IMPLANT
PAD PREP 24X41 OB/GYN DISP (PERSONAL CARE ITEMS) ×3 IMPLANT
SOL .9 NS 3000ML IRR  AL (IV SOLUTION) ×2
SOL .9 NS 3000ML IRR UROMATIC (IV SOLUTION) ×1 IMPLANT
TOWEL OR 17X26 4PK STRL BLUE (TOWEL DISPOSABLE) ×3 IMPLANT
TUBING CONNECTING 10 (TUBING) ×2 IMPLANT
TUBING CONNECTING 10' (TUBING) ×1
TUBING HYSTEROSCOPY DOLPHIN (MISCELLANEOUS) ×3 IMPLANT

## 2015-05-05 NOTE — Discharge Instructions (Signed)

## 2015-05-05 NOTE — Transfer of Care (Signed)
Immediate Anesthesia Transfer of Care Note  Patient: Stacey Stone  Procedure(s) Performed: Procedure(s): Hysteroscopy and endometrial curretage (N/A)  Patient Location: PACU  Anesthesia Type:General  Level of Consciousness: sedated  Airway & Oxygen Therapy: Patient Spontanous Breathing and Patient connected to face mask oxygen  Post-op Assessment: Report given to RN and Post -op Vital signs reviewed and stable  Post vital signs: Reviewed and stable  Last Vitals:  Filed Vitals:   05/05/15 0623  BP: 102/61  Pulse: 74  Temp: 36.9 C  Resp: 20    Complications: No apparent anesthesia complications

## 2015-05-05 NOTE — Op Note (Signed)
Stacey Stone, Stacey Stone          ACCOUNT NO.:  [id number]  MEDICAL RECORD NO.:  [id number]  LOCATION:                                 FACILITY:  PHYSICIAN:  Laverta Baltimore, MDDATE OF BIRTH:  27-Jul-1945  DATE OF PROCEDURE: DATE OF DISCHARGE:                              OPERATIVE REPORT   PREOPERATIVE DIAGNOSIS: 1. Postmenopausal bleeding. 2. Endometrial mass.  POSTOPERATIVE DIAGNOSIS: 1. Postmenopausal bleeding. 2. Normal appearing endometrial cavity.  PROCEDURE PERFORMED: 1. Hysteroscopy. 2. Dilation and curettage.  SURGEON:  Laverta Baltimore, MD  ANESTHESIA:  General endotracheal anesthesia.  INDICATIONS:  A 70 year old patient with postmenopausal bleeding, evaluated in the office and underwent a saline infusion sonohysterography which had demonstrated a 2.5 x 2 cm endometrial mass consistent with a fibroid.  The patient is status post MyoSure in 2014. The patient is on aspirin and Eliquis which was stopped 6 days prior to commencement of the procedure.  DESCRIPTION OF PROCEDURE:  After adequate general endotracheal anesthesia, the patient was placed in dorsal supine position, legs in the [place] stirrups.  The patient's abdomen, perineum, and vagina were prepped and draped in normal sterile fashion.  The patient did receive 2 g of cefoxitin prior to commencement of the case.  A weighted speculum was placed in the posterior vagina and the anterior cervix was grasped with a single-tooth tenaculum.  The cervix was dilated with #17 Hanks dilator without difficulty.  The hysteroscope was advanced into the endometrial cavity without difficulty.  Endometrium appeared normal. There was slight bulging area anterior at the uterine cervical junction; however, there was no definitive mass identified.  The fallopian tube ostia were identified bilaterally.  No active bleeding.  Lactated Ringer's was used as the distending medium, 0 net deficit.  Endometrial curettage was  then performed without difficulty.  Scant tissue removed that should be sent to pathology.  Tenaculum was removed.  Good hemostasis was noted.  Of note, the patient's bladder was catheterized prior to commencement of the case yielding 100 mL clear urine. Procedure was terminated.  No complications.  ESTIMATED BLOOD LOSS:  Minimal.  INTRAOPERATIVE FLUIDS:  500 mL.  Intravascular at 0 mL lactated Ringer's.  Deficit on hysteroscopy.  URINE OUTPUT:  100 mL.  The patient was taken to recovery room in good condition.          ______________________________ Laverta Baltimore, MD     TS/MEDQ  D:  05/05/2015  T:  05/05/2015  Job:  [id number]

## 2015-05-05 NOTE — Progress Notes (Signed)
Pt interviewed . Off Plavix and ASA . For 6 days . CLeared for surgery by Dr Loleta Chance . NPO . Ready for surgery . Myosure resection of endometrial mass . All questions answered .

## 2015-05-05 NOTE — Anesthesia Procedure Notes (Signed)
Procedure Name: LMA Insertion Date/Time: 05/05/2015 7:45 AM Performed by: Allean Found Pre-anesthesia Checklist: Patient identified, Emergency Drugs available, Suction available, Patient being monitored and Timeout performed Patient Re-evaluated:Patient Re-evaluated prior to inductionOxygen Delivery Method: Circle system utilized Preoxygenation: Pre-oxygenation with 100% oxygen Intubation Type: IV induction LMA: LMA inserted LMA Size: 4.0 Number of attempts: 1 Placement Confirmation: ETT inserted through vocal cords under direct vision and positive ETCO2 Tube secured with: Tape Dental Injury: Teeth and Oropharynx as per pre-operative assessment

## 2015-05-05 NOTE — Brief Op Note (Signed)
05/05/2015  8:15 AM  PATIENT:  Stacey Stone  70 y.o. female  PRE-OPERATIVE DIAGNOSIS:  submucosal fibroid  POST-OPERATIVE DIAGNOSIS: normal endometrial cavity  PROCEDURE:  Dilation and curettage, hysteroscopy  SURGEON:  Surgeon(s) and Role:    Boykin Nearing, MD - Primary  PHYSICIAN ASSISTANT:   ASSISTANTS: none   ANESTHESIA:   general  EBL:  Total I/O In: 500 [I.V.:500] Out: - orine 100 cc, EBL : minimal . 0 cc net deficit LR  BLOOD ADMINISTERED:none  DRAINS: none   LOCAL MEDICATIONS USED:  NONE  SPECIMEN:  Source of Specimen:  endometrial curettings  DISPOSITION OF SPECIMEN:  PATHOLOGY  COUNTS:  YES  TOURNIQUET:  * No tourniquets in log *  DICTATION: .Other Dictation: Dictation Number verbal   PLAN OF CARE: Discharge to home after PACU  PATIENT DISPOSITION:  PACU - hemodynamically stable.   Delay start of Pharmacological VTE agent (>24hrs) due to surgical blood loss or risk of bleeding: not applicable

## 2015-05-05 NOTE — Anesthesia Preprocedure Evaluation (Signed)
Anesthesia Evaluation  Patient identified by MRN, date of birth, ID band Patient awake    Reviewed: Allergy & Precautions, H&P , NPO status , Patient's Chart, lab work & pertinent test results, reviewed documented beta blocker date and time   Airway Mallampati: II  TM Distance: >3 FB Neck ROM: full    Dental  (+) Teeth Intact, Upper Dentures, Lower Dentures   Pulmonary neg pulmonary ROS, sleep apnea and Continuous Positive Airway Pressure Ventilation ,    Pulmonary exam normal        Cardiovascular Exercise Tolerance: Good hypertension, + angina with exertion + CAD  negative cardio ROS Normal cardiovascular exam Rate:Normal  Single vessel dz followed by [facility] and last seen in Oct.  Stable and rare pattern with exertion.  None at rest.  Compliant with meds.   Neuro/Psych  Neuromuscular disease negative neurological ROS  negative psych ROS   GI/Hepatic negative GI ROS, Neg liver ROS, hiatal hernia, GERD  ,  Endo/Other  negative endocrine ROSHypothyroidism   Renal/GU negative Renal ROS  negative genitourinary   Musculoskeletal   Abdominal   Peds  Hematology negative hematology ROS (+)   Anesthesia Other Findings   Reproductive/Obstetrics negative OB ROS                             Anesthesia Physical Anesthesia Plan  ASA: III  Anesthesia Plan: General LMA   Post-op Pain Management:    Induction:   Airway Management Planned:   Additional Equipment:   Intra-op Plan:   Post-operative Plan:   Informed Consent: I have reviewed the patients History and Physical, chart, labs and discussed the procedure including the risks, benefits and alternatives for the proposed anesthesia with the patient or authorized representative who has indicated his/her understanding and acceptance.     Plan Discussed with: CRNA  Anesthesia Plan Comments:         Anesthesia Quick Evaluation

## 2015-05-06 LAB — SURGICAL PATHOLOGY

## 2015-05-08 NOTE — Anesthesia Postprocedure Evaluation (Signed)
Anesthesia Post Note  Patient: Stacey Stone  Procedure(s) Performed: Procedure(s) (LRB): Hysteroscopy and endometrial curretage (N/A)  Patient location during evaluation: PACU Anesthesia Type: General Level of consciousness: awake and alert Pain management: pain level controlled Vital Signs Assessment: post-procedure vital signs reviewed and stable Respiratory status: spontaneous breathing, nonlabored ventilation, respiratory function stable and patient connected to nasal cannula oxygen Cardiovascular status: blood pressure returned to baseline and stable Postop Assessment: no signs of nausea or vomiting Anesthetic complications: no    Last Vitals:  Filed Vitals:   05/05/15 1006 05/05/15 1015  BP: 92/67 95/75  Pulse: 68 67  Temp: 36.3 C 36.4 C  Resp: 15 16    Last Pain:  Filed Vitals:   05/06/15 1640  PainSc: 0-No pain                 Molli Barrows

## 2015-05-14 ENCOUNTER — Inpatient Hospital Stay: Payer: 59 | Attending: Oncology

## 2015-05-14 ENCOUNTER — Inpatient Hospital Stay (HOSPITAL_BASED_OUTPATIENT_CLINIC_OR_DEPARTMENT_OTHER): Payer: 59 | Admitting: Oncology

## 2015-05-14 VITALS — BP 150/85 | HR 57 | Temp 97.6°F | Resp 18 | Wt 192.7 lb

## 2015-05-14 DIAGNOSIS — I10 Essential (primary) hypertension: Secondary | ICD-10-CM | POA: Insufficient documentation

## 2015-05-14 DIAGNOSIS — Z7901 Long term (current) use of anticoagulants: Secondary | ICD-10-CM | POA: Diagnosis not present

## 2015-05-14 DIAGNOSIS — G4730 Sleep apnea, unspecified: Secondary | ICD-10-CM | POA: Diagnosis not present

## 2015-05-14 DIAGNOSIS — K219 Gastro-esophageal reflux disease without esophagitis: Secondary | ICD-10-CM | POA: Insufficient documentation

## 2015-05-14 DIAGNOSIS — Z9989 Dependence on other enabling machines and devices: Secondary | ICD-10-CM | POA: Diagnosis not present

## 2015-05-14 DIAGNOSIS — E039 Hypothyroidism, unspecified: Secondary | ICD-10-CM

## 2015-05-14 DIAGNOSIS — I2510 Atherosclerotic heart disease of native coronary artery without angina pectoris: Secondary | ICD-10-CM

## 2015-05-14 DIAGNOSIS — C884 Extranodal marginal zone B-cell lymphoma of mucosa-associated lymphoid tissue [MALT-lymphoma]: Secondary | ICD-10-CM | POA: Diagnosis present

## 2015-05-14 DIAGNOSIS — M1990 Unspecified osteoarthritis, unspecified site: Secondary | ICD-10-CM | POA: Diagnosis not present

## 2015-05-14 DIAGNOSIS — C8590 Non-Hodgkin lymphoma, unspecified, unspecified site: Secondary | ICD-10-CM

## 2015-05-14 DIAGNOSIS — Z79899 Other long term (current) drug therapy: Secondary | ICD-10-CM | POA: Insufficient documentation

## 2015-05-14 DIAGNOSIS — Z90710 Acquired absence of both cervix and uterus: Secondary | ICD-10-CM | POA: Diagnosis not present

## 2015-05-14 DIAGNOSIS — Z7982 Long term (current) use of aspirin: Secondary | ICD-10-CM | POA: Insufficient documentation

## 2015-05-14 DIAGNOSIS — Z9049 Acquired absence of other specified parts of digestive tract: Secondary | ICD-10-CM

## 2015-05-14 LAB — CBC WITH DIFFERENTIAL/PLATELET
Basophils Absolute: 0.1 10*3/uL (ref 0–0.1)
Basophils Relative: 1 %
EOS PCT: 2 %
Eosinophils Absolute: 0.2 10*3/uL (ref 0–0.7)
HCT: 37.2 % (ref 35.0–47.0)
Hemoglobin: 12.5 g/dL (ref 12.0–16.0)
LYMPHS ABS: 2.5 10*3/uL (ref 1.0–3.6)
LYMPHS PCT: 31 %
MCH: 28.5 pg (ref 26.0–34.0)
MCHC: 33.5 g/dL (ref 32.0–36.0)
MCV: 85.1 fL (ref 80.0–100.0)
MONO ABS: 0.4 10*3/uL (ref 0.2–0.9)
MONOS PCT: 5 %
Neutro Abs: 4.9 10*3/uL (ref 1.4–6.5)
Neutrophils Relative %: 61 %
PLATELETS: 235 10*3/uL (ref 150–440)
RBC: 4.37 MIL/uL (ref 3.80–5.20)
RDW: 15.4 % — AB (ref 11.5–14.5)
WBC: 8.1 10*3/uL (ref 3.6–11.0)

## 2015-05-14 LAB — COMPREHENSIVE METABOLIC PANEL
ALT: 84 U/L — ABNORMAL HIGH (ref 14–54)
AST: 48 U/L — ABNORMAL HIGH (ref 15–41)
Albumin: 3.6 g/dL (ref 3.5–5.0)
Alkaline Phosphatase: 150 U/L — ABNORMAL HIGH (ref 38–126)
Anion gap: 6 (ref 5–15)
BUN: 12 mg/dL (ref 6–20)
CHLORIDE: 105 mmol/L (ref 101–111)
CO2: 25 mmol/L (ref 22–32)
Calcium: 8.9 mg/dL (ref 8.9–10.3)
Creatinine, Ser: 1.03 mg/dL — ABNORMAL HIGH (ref 0.44–1.00)
GFR, EST NON AFRICAN AMERICAN: 53 mL/min — AB (ref >60)
Glucose, Bld: 121 mg/dL — ABNORMAL HIGH (ref 65–99)
POTASSIUM: 3.8 mmol/L (ref 3.5–5.1)
Sodium: 136 mmol/L (ref 135–145)
Total Bilirubin: 0.5 mg/dL (ref 0.3–1.2)
Total Protein: 7 g/dL (ref 6.5–8.1)

## 2015-05-14 LAB — LACTATE DEHYDROGENASE: LDH: 137 U/L (ref 98–192)

## 2015-05-14 NOTE — Progress Notes (Signed)
[place]  [phone]  Fax:([phone]) [place] DOB: 01/18/1946  MR#: [id number]  IHW#:[id number]  Patient Care Team: Rusty Aus, MD as PCP - General (Internal Medicine)  CHIEF COMPLAINT:  Chief Complaint  Patient presents with  . 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  Patient underwent excision of right submandibular gland in November 2015. She has also recently had a hysteroscopy with D&C for postmenopausal bleeding. There was concern for a fibroid in the uterus but during procedure noted to have had a cyst that ruptured. She overall reports feeling very well and denies any new areas of glandular enlargement.   REVIEW OF SYSTEMS:   Review of Systems  Constitutional: Negative for fever, chills, weight loss, malaise/fatigue and diaphoresis.  HENT: Negative.   Eyes: Negative.   Respiratory: Negative for cough, hemoptysis, sputum production, shortness of breath and wheezing.   Cardiovascular: Negative for chest pain, palpitations, orthopnea, claudication, leg swelling and PND.  Gastrointestinal: Negative for heartburn, nausea, vomiting, abdominal pain, diarrhea, constipation, blood in stool and melena.  Genitourinary: Negative.   Musculoskeletal: Negative.   Skin: Negative.   Neurological: Negative for dizziness, tingling, focal weakness, seizures and weakness.  Endo/Heme/Allergies: Does not bruise/bleed easily.  Psychiatric/Behavioral: Negative for depression. The patient is not nervous/anxious and does not have insomnia.     As per HPI. Otherwise, a complete review of systems is negatve.  ONCOLOGY HISTORY:   MALT (mucosa associated lymphoid tissue) ([place])   07/05/2014 Initial Diagnosis MALT (mucosa associated lymphoid tissue)    PAST MEDICAL HISTORY: Past Medical History  Diagnosis Date  . Coronary  artery disease T    1 artery 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 Mamie Nick. at [facility] in [place]  . No pertinent past medical history   . Sleep apnea t    O2- 2l at bedtime and BiPap @ bedtime  . Hypertension   . GERD (gastroesophageal reflux disease)   . Hypothyroidism   . Arthritis t    knee  . Hiatal hernia   . Depression   . MALT (mucosa associated lymphoid tissue) ([place]) 07/05/2014    PAST SURGICAL HISTORY: Past Surgical History  Procedure Laterality Date  . Eye surgery  t    bil cataract 9/08  . Cholecystectomy  11/26    08/1970  . Scleral buckle  02/16/2011    Procedure: SCLERAL BUCKLE;  Surgeon: Hayden Pedro, MD;  Location: [facility];  Service: Ophthalmology;  Laterality: Left;  Scleral Buckle Left Eye with Headscope Laser  . Cardiac catheterization    . Dilation and curettage of uterus    . Joint replacement Bilateral 2008, 11/26/ 2012    Partial Knee Replacements  . Hammer toe surgery Right   . Excision mass from neck  Right     Dr. Tami Ribas  . Dilatation & curettage/hysteroscopy with myosure N/A 05/05/2015    Procedure: Hysteroscopy and endometrial curretage;  Surgeon: Boykin Nearing, MD;  Location: [facility];  Service: Gynecology;  Laterality: N/A;    FAMILY HISTORY Family History  Problem Relation Age of Onset  . Anesthesia problems Neg Hx   . Hypotension Neg Hx   . Malignant hyperthermia Neg Hx   . Pseudochol deficiency Neg Hx   . Breast cancer Mother 37  . Breast cancer Paternal Aunt 73  . Heart disease Father     GYNECOLOGIC HISTORY:  No LMP recorded. Patient is postmenopausal.     ADVANCED DIRECTIVES:    HEALTH MAINTENANCE: Social History  Substance Use Topics  . Smoking status: Never Smoker   . Smokeless tobacco: Never Used  . Alcohol Use: No     Colonoscopy:  PAP:  Bone density:  Mammogram: November 2016  Allergies  Allergen Reactions  . Codeine Other (See Comments)    Stroke like symptoms    Current Outpatient Prescriptions    Medication Sig Dispense Refill  . Acetaminophen 500 MG coapsule Take 1,000 mg by mouth every 4 (four) hours as needed for pain (for headache).     Marland Kitchen apixaban (ELIQUIS) 5 MG TABS tablet Take 1 tablet (5 mg total) by mouth 2 (two) times daily. 60 tablet 1  . aspirin EC 81 MG tablet Take 81 mg by mouth daily.      Marland Kitchen diltiazem (CARDIZEM CD) 240 MG 24 hr capsule Take 240 mg by mouth daily.      . Fluticasone-Salmeterol (ADVAIR) 100-50 MCG/DOSE AEPB Inhale 1 puff into the lungs 2 (two) times daily as needed (for asthma.).    Marland Kitchen gatifloxacin (ZYMAXID) 0.5 % SOLN Place 1 drop into the left eye 4 (four) times daily.    . ISOSORBIDE MONONITRATE ER PO Take 30 mg by mouth daily.    Marland Kitchen levothyroxine (SYNTHROID, LEVOTHROID) 150 MCG tablet Take 150 mcg by mouth daily.      Marland Kitchen losartan-hydrochlorothiazide (HYZAAR) 50-12.5 MG tablet Take 1 tablet by mouth daily.    . metoprolol (TOPROL-XL) 100 MG 24 hr tablet Take 50 mg by mouth 2 (two) times daily.     . nitroGLYCERIN (NITROSTAT) 0.4 MG SL tablet Place 0.4 mg under the tongue every 5 (five) minutes as needed for chest pain. Maximum 3 doses, If no relief call md or 911.    . potassium chloride SA (K-DUR,KLOR-CON) 20 MEQ tablet Take 30 mEq by mouth daily.      . RABEprazole (ACIPHEX) 20 MG tablet Take 20 mg by mouth daily.      . sertraline (ZOLOFT) 50 MG tablet Take 50 mg by mouth at bedtime.      . simvastatin (ZOCOR) 40 MG tablet Take 40 mg by mouth at bedtime.      . torsemide (DEMADEX) 20 MG tablet Take 30 mg by mouth daily.      . vitamin B-12 (CYANOCOBALAMIN) 1000 MCG tablet Take 1,000 mcg by mouth daily.     No current facility-administered medications for this visit.    OBJECTIVE: BP 150/85 mmHg  Pulse 57  Temp(Src) 97.6 F (36.4 C) (Tympanic)  Resp 18  Wt 192 lb 10.9 oz (87.4 kg)   Body mass index is 34.14 kg/(m^2).    ECOG FS:0 - Asymptomatic  General: Well-developed, well-nourished, no acute distress. Eyes: Pink conjunctiva, anicteric  sclera. HEENT: Normocephalic, moist mucous membranes, clear oropharnyx. Lungs: Clear to auscultation bilaterally. Heart: Regular rate and rhythm. No rubs, murmurs, or gallops. Abdomen: Soft, nontender, nondistended. No organomegaly noted, normoactive bowel sounds. Musculoskeletal: No edema, cyanosis, or clubbing. Neuro: Alert, answering all questions appropriately. Cranial nerves grossly intact. Skin: No rashes or petechiae noted. Psych: Normal affect. Lymphatics: No cervical, clavicular, or axillary LAD.   LAB RESULTS:  Appointment on 05/14/2015  Component Date Value Ref Range Status  . WBC 05/14/2015 8.1  3.6 - 11.0 K/uL Final  . RBC 05/14/2015 4.37  3.80 - 5.20 MIL/uL Final  . Hemoglobin 05/14/2015 12.5  12.0 - 16.0 g/dL Final  . HCT  05/14/2015 37.2  35.0 - 47.0 % Final  . MCV 05/14/2015 85.1  80.0 - 100.0 fL Final  . MCH 05/14/2015 28.5  26.0 - 34.0 pg Final  . MCHC 05/14/2015 33.5  32.0 - 36.0 g/dL Final  . RDW 05/14/2015 15.4* 11.5 - 14.5 % Final  . Platelets 05/14/2015 235  150 - 440 K/uL Final  . Neutrophils Relative % 05/14/2015 61   Final  . Neutro Abs 05/14/2015 4.9  1.4 - 6.5 K/uL Final  . Lymphocytes Relative 05/14/2015 31   Final  . Lymphs Abs 05/14/2015 2.5  1.0 - 3.6 K/uL Final  . Monocytes Relative 05/14/2015 5   Final  . Monocytes Absolute 05/14/2015 0.4  0.2 - 0.9 K/uL Final  . Eosinophils Relative 05/14/2015 2   Final  . Eosinophils Absolute 05/14/2015 0.2  0 - 0.7 K/uL Final  . Basophils Relative 05/14/2015 1   Final  . Basophils Absolute 05/14/2015 0.1  0 - 0.1 K/uL Final  . Sodium 05/14/2015 136  135 - 145 mmol/L Final  . Potassium 05/14/2015 3.8  3.5 - 5.1 mmol/L Final  . Chloride 05/14/2015 105  101 - 111 mmol/L Final  . CO2 05/14/2015 25  22 - 32 mmol/L Final  . Glucose, Bld 05/14/2015 121* 65 - 99 mg/dL Final  . BUN 05/14/2015 12  6 - 20 mg/dL Final  . Creatinine, Ser 05/14/2015 1.03* 0.44 - 1.00 mg/dL Final  . Calcium 05/14/2015 8.9  8.9 - 10.3  mg/dL Final  . Total Protein 05/14/2015 7.0  6.5 - 8.1 g/dL Final  . Albumin 05/14/2015 3.6  3.5 - 5.0 g/dL Final  . AST 05/14/2015 48* 15 - 41 U/L Final  . ALT 05/14/2015 84* 14 - 54 U/L Final  . Alkaline Phosphatase 05/14/2015 150* 38 - 126 U/L Final  . Total Bilirubin 05/14/2015 0.5  0.3 - 1.2 mg/dL Final  . GFR calc non Af Amer 05/14/2015 53* >60 mL/min Final  . GFR calc Af Amer 05/14/2015 >60  >60 mL/min Final   Comment: (NOTE) The eGFR has been calculated using the CKD EPI equation. This calculation has not been validated in all clinical situations. eGFR's persistently <60 mL/min signify possible Chronic Kidney Disease.   . Anion gap 05/14/2015 6  5 - 15 Final  . LDH 05/14/2015 137  98 - 192 U/L Final    STUDIES: No results found.  ASSESSMENT:  MALT,  Stage I.  PLAN:    1. MALT. Patient is status post excision of a right submandibular gland in 2015. Clinically there is no evidence of recurrent disease. She last had a PET scan in December 2015 which showed some mild lymph node uptake in the upper abdomen as well as some mild enlargement of the spleen. After discussion with Dr. Oliva Bustard we will order a PET scan for reevaluation in approximately 6 months. She will follow-up in clinic following PET scan for results.  Patient expressed understanding and was in agreement with this plan. She also understands that She can call clinic at any time with any questions, concerns, or complaints.   Dr. Oliva Bustard was available for consultation and review of plan of care for this patient.   Evlyn Kanner, NP   05/14/2015 3:00 PM

## 2015-06-05 ENCOUNTER — Encounter (INDEPENDENT_AMBULATORY_CARE_PROVIDER_SITE_OTHER): Payer: 59 | Admitting: Ophthalmology

## 2015-06-05 DIAGNOSIS — H348312 Tributary (branch) retinal vein occlusion, right eye, stable: Secondary | ICD-10-CM | POA: Diagnosis not present

## 2015-06-05 DIAGNOSIS — H35033 Hypertensive retinopathy, bilateral: Secondary | ICD-10-CM | POA: Diagnosis not present

## 2015-06-05 DIAGNOSIS — H43813 Vitreous degeneration, bilateral: Secondary | ICD-10-CM

## 2015-06-05 DIAGNOSIS — H338 Other retinal detachments: Secondary | ICD-10-CM

## 2015-06-05 DIAGNOSIS — I10 Essential (primary) hypertension: Secondary | ICD-10-CM | POA: Diagnosis not present

## 2015-06-05 DIAGNOSIS — H34812 Central retinal vein occlusion, left eye, with macular edema: Secondary | ICD-10-CM

## 2015-06-25 DIAGNOSIS — Z96651 Presence of right artificial knee joint: Secondary | ICD-10-CM | POA: Insufficient documentation

## 2015-07-10 ENCOUNTER — Encounter (INDEPENDENT_AMBULATORY_CARE_PROVIDER_SITE_OTHER): Payer: 59 | Admitting: Ophthalmology

## 2015-07-10 DIAGNOSIS — I10 Essential (primary) hypertension: Secondary | ICD-10-CM | POA: Diagnosis not present

## 2015-07-10 DIAGNOSIS — H338 Other retinal detachments: Secondary | ICD-10-CM | POA: Diagnosis not present

## 2015-07-10 DIAGNOSIS — H43813 Vitreous degeneration, bilateral: Secondary | ICD-10-CM

## 2015-07-10 DIAGNOSIS — H35033 Hypertensive retinopathy, bilateral: Secondary | ICD-10-CM | POA: Diagnosis not present

## 2015-07-10 DIAGNOSIS — H348312 Tributary (branch) retinal vein occlusion, right eye, stable: Secondary | ICD-10-CM | POA: Diagnosis not present

## 2015-07-10 DIAGNOSIS — H348121 Central retinal vein occlusion, left eye, with retinal neovascularization: Secondary | ICD-10-CM

## 2015-08-21 ENCOUNTER — Encounter (INDEPENDENT_AMBULATORY_CARE_PROVIDER_SITE_OTHER): Payer: 59 | Admitting: Ophthalmology

## 2015-08-21 DIAGNOSIS — H348312 Tributary (branch) retinal vein occlusion, right eye, stable: Secondary | ICD-10-CM | POA: Diagnosis not present

## 2015-08-21 DIAGNOSIS — H34812 Central retinal vein occlusion, left eye, with macular edema: Secondary | ICD-10-CM

## 2015-08-21 DIAGNOSIS — H43813 Vitreous degeneration, bilateral: Secondary | ICD-10-CM

## 2015-08-21 DIAGNOSIS — I10 Essential (primary) hypertension: Secondary | ICD-10-CM

## 2015-08-21 DIAGNOSIS — H35033 Hypertensive retinopathy, bilateral: Secondary | ICD-10-CM

## 2015-08-21 DIAGNOSIS — H338 Other retinal detachments: Secondary | ICD-10-CM | POA: Diagnosis not present

## 2015-09-24 ENCOUNTER — Encounter: Payer: Self-pay | Admitting: *Deleted

## 2015-09-24 ENCOUNTER — Observation Stay
Admission: RE | Admit: 2015-09-24 | Discharge: 2015-09-25 | Disposition: A | Discharge status: Home / Self Care | Payer: Commercial Managed Care - HMO | Source: Ambulatory Visit | Attending: Cardiology | Admitting: Cardiology

## 2015-09-24 ENCOUNTER — Encounter
Admission: RE | Disposition: A | Discharge status: Home / Self Care | Payer: Self-pay | Source: Ambulatory Visit | Attending: Cardiology

## 2015-09-24 DIAGNOSIS — Z8249 Family history of ischemic heart disease and other diseases of the circulatory system: Secondary | ICD-10-CM | POA: Diagnosis not present

## 2015-09-24 DIAGNOSIS — C8590 Non-Hodgkin lymphoma, unspecified, unspecified site: Secondary | ICD-10-CM | POA: Insufficient documentation

## 2015-09-24 DIAGNOSIS — Z7951 Long term (current) use of inhaled steroids: Secondary | ICD-10-CM | POA: Insufficient documentation

## 2015-09-24 DIAGNOSIS — I25110 Atherosclerotic heart disease of native coronary artery with unstable angina pectoris: Secondary | ICD-10-CM | POA: Diagnosis not present

## 2015-09-24 DIAGNOSIS — I440 Atrioventricular block, first degree: Secondary | ICD-10-CM | POA: Insufficient documentation

## 2015-09-24 DIAGNOSIS — E785 Hyperlipidemia, unspecified: Secondary | ICD-10-CM | POA: Diagnosis not present

## 2015-09-24 DIAGNOSIS — E7800 Pure hypercholesterolemia, unspecified: Secondary | ICD-10-CM | POA: Diagnosis not present

## 2015-09-24 DIAGNOSIS — I480 Paroxysmal atrial fibrillation: Secondary | ICD-10-CM | POA: Diagnosis not present

## 2015-09-24 DIAGNOSIS — Z9889 Other specified postprocedural states: Secondary | ICD-10-CM | POA: Diagnosis not present

## 2015-09-24 DIAGNOSIS — I200 Unstable angina: Secondary | ICD-10-CM | POA: Diagnosis present

## 2015-09-24 DIAGNOSIS — Z9049 Acquired absence of other specified parts of digestive tract: Secondary | ICD-10-CM | POA: Insufficient documentation

## 2015-09-24 DIAGNOSIS — Z79899 Other long term (current) drug therapy: Secondary | ICD-10-CM | POA: Insufficient documentation

## 2015-09-24 DIAGNOSIS — E079 Disorder of thyroid, unspecified: Secondary | ICD-10-CM | POA: Insufficient documentation

## 2015-09-24 DIAGNOSIS — Z7901 Long term (current) use of anticoagulants: Secondary | ICD-10-CM | POA: Diagnosis not present

## 2015-09-24 DIAGNOSIS — Y840 Cardiac catheterization as the cause of abnormal reaction of the patient, or of later complication, without mention of misadventure at the time of the procedure: Secondary | ICD-10-CM

## 2015-09-24 DIAGNOSIS — Z803 Family history of malignant neoplasm of breast: Secondary | ICD-10-CM | POA: Insufficient documentation

## 2015-09-24 DIAGNOSIS — G4733 Obstructive sleep apnea (adult) (pediatric): Secondary | ICD-10-CM | POA: Diagnosis not present

## 2015-09-24 DIAGNOSIS — Z885 Allergy status to narcotic agent status: Secondary | ICD-10-CM | POA: Insufficient documentation

## 2015-09-24 DIAGNOSIS — Z7982 Long term (current) use of aspirin: Secondary | ICD-10-CM | POA: Insufficient documentation

## 2015-09-24 DIAGNOSIS — I10 Essential (primary) hypertension: Secondary | ICD-10-CM | POA: Diagnosis not present

## 2015-09-24 HISTORY — DX: Angina pectoris, unspecified: I20.9

## 2015-09-24 HISTORY — PX: CARDIAC CATHETERIZATION: SHX172

## 2015-09-24 HISTORY — DX: Cardiac arrhythmia, unspecified: I49.9

## 2015-09-24 SURGERY — LEFT HEART CATH AND CORONARY ANGIOGRAPHY
Anesthesia: Moderate Sedation

## 2015-09-24 MED ORDER — ASPIRIN 81 MG PO CHEW
81.0000 mg | CHEWABLE_TABLET | Freq: Every day | ORAL | Status: DC
  Administered 2015-09-25: 81 mg via ORAL
  Filled 2015-09-24: qty 1

## 2015-09-24 MED ORDER — BIVALIRUDIN 250 MG IV SOLR
250.0000 mg | INTRAVENOUS | Status: DC | PRN
  Administered 2015-09-24: 1.75 mg/kg/h via INTRAVENOUS

## 2015-09-24 MED ORDER — HYDROCODONE-ACETAMINOPHEN 5-325 MG PO TABS
1.0000 | ORAL_TABLET | Freq: Four times a day (QID) | ORAL | Status: DC | PRN
Start: 2015-09-24 — End: 2015-09-25
  Administered 2015-09-24: 1 via ORAL

## 2015-09-24 MED ORDER — SIMVASTATIN 40 MG PO TABS
40.0000 mg | ORAL_TABLET | Freq: Every day | ORAL | Status: DC
  Administered 2015-09-24: 40 mg via ORAL
  Filled 2015-09-24: qty 1

## 2015-09-24 MED ORDER — MOMETASONE FURO-FORMOTEROL FUM 100-5 MCG/ACT IN AERO
2.0000 | INHALATION_SPRAY | Freq: Two times a day (BID) | RESPIRATORY_TRACT | Status: DC
  Filled 2015-09-24: qty 8.8

## 2015-09-24 MED ORDER — HYDROCODONE-ACETAMINOPHEN 5-325 MG PO TABS
ORAL_TABLET | ORAL | Status: AC
  Filled 2015-09-24: qty 1

## 2015-09-24 MED ORDER — SODIUM CHLORIDE 0.9 % WEIGHT BASED INFUSION
254.4000 mL/h | INTRAVENOUS | Status: DC
  Administered 2015-09-24: 3 mL/kg/h via INTRAVENOUS

## 2015-09-24 MED ORDER — TORSEMIDE 20 MG PO TABS
20.0000 mg | ORAL_TABLET | Freq: Every day | ORAL | Status: DC
  Administered 2015-09-24 – 2015-09-25 (×2): 20 mg via ORAL
  Filled 2015-09-24 (×2): qty 1

## 2015-09-24 MED ORDER — SODIUM CHLORIDE 0.9 % WEIGHT BASED INFUSION: 84.8000 mL/h | INTRAVENOUS | Status: DC

## 2015-09-24 MED ORDER — PANTOPRAZOLE SODIUM 40 MG PO TBEC
40.0000 mg | DELAYED_RELEASE_TABLET | Freq: Once | ORAL | Status: AC
  Administered 2015-09-24: 40 mg via ORAL
  Filled 2015-09-24: qty 1

## 2015-09-24 MED ORDER — HEPARIN (PORCINE) IN NACL 2-0.9 UNIT/ML-% IJ SOLN
INTRAMUSCULAR | Status: AC
  Filled 2015-09-24: qty 500

## 2015-09-24 MED ORDER — SODIUM CHLORIDE 0.9% FLUSH: 3.0000 mL | INTRAVENOUS | Status: DC | PRN

## 2015-09-24 MED ORDER — BIVALIRUDIN 250 MG IV SOLR
INTRAVENOUS | Status: AC
  Filled 2015-09-24: qty 250

## 2015-09-24 MED ORDER — ACETAMINOPHEN 325 MG PO TABS: 650.0000 mg | ORAL_TABLET | ORAL | Status: DC | PRN

## 2015-09-24 MED ORDER — ASPIRIN 81 MG PO CHEW
CHEWABLE_TABLET | ORAL | Status: AC
  Filled 2015-09-24: qty 4

## 2015-09-24 MED ORDER — SODIUM CHLORIDE 0.9 % WEIGHT BASED INFUSION: 3.0000 mL/kg/h | INTRAVENOUS | Status: AC

## 2015-09-24 MED ORDER — SODIUM CHLORIDE 0.9% FLUSH
3.0000 mL | Freq: Two times a day (BID) | INTRAVENOUS | Status: DC
  Administered 2015-09-24: 3 mL via INTRAVENOUS

## 2015-09-24 MED ORDER — SODIUM CHLORIDE 0.9% FLUSH
3.0000 mL | INTRAVENOUS | Status: DC | PRN
Start: 2015-09-24 — End: 2015-09-25

## 2015-09-24 MED ORDER — BIVALIRUDIN BOLUS VIA INFUSION - CUPID
INTRAVENOUS | Status: DC | PRN
  Administered 2015-09-24: 63.6 mg via INTRAVENOUS

## 2015-09-24 MED ORDER — NITROGLYCERIN 1 MG/10 ML FOR IR/CATH LAB
INTRA_ARTERIAL | Status: DC | PRN
  Administered 2015-09-24: 300 ug via INTRACORONARY
  Administered 2015-09-24: 200 ug via INTRACORONARY
  Administered 2015-09-24: 300 ug via INTRACORONARY

## 2015-09-24 MED ORDER — POTASSIUM CHLORIDE CRYS ER 20 MEQ PO TBCR
20.0000 meq | EXTENDED_RELEASE_TABLET | Freq: Every day | ORAL | Status: DC
  Administered 2015-09-24 – 2015-09-25 (×2): 20 meq via ORAL
  Filled 2015-09-24: qty 2
  Filled 2015-09-24: qty 1
  Filled 2015-09-24 (×2): qty 2

## 2015-09-24 MED ORDER — FENTANYL CITRATE (PF) 100 MCG/2ML IJ SOLN
INTRAMUSCULAR | Status: AC
  Filled 2015-09-24: qty 2

## 2015-09-24 MED ORDER — CLOPIDOGREL BISULFATE 75 MG PO TABS
75.0000 mg | ORAL_TABLET | Freq: Every day | ORAL | Status: DC
  Administered 2015-09-25: 75 mg via ORAL
  Filled 2015-09-24: qty 1

## 2015-09-24 MED ORDER — MIDAZOLAM HCL 2 MG/2ML IJ SOLN
INTRAMUSCULAR | Status: DC | PRN
  Administered 2015-09-24: 1 mg via INTRAVENOUS

## 2015-09-24 MED ORDER — MIDAZOLAM HCL 2 MG/2ML IJ SOLN
INTRAMUSCULAR | Status: AC
  Filled 2015-09-24: qty 2

## 2015-09-24 MED ORDER — IOPAMIDOL (ISOVUE-300) INJECTION 61%
INTRAVENOUS | Status: DC | PRN
  Administered 2015-09-24: 280 mL via INTRA_ARTERIAL

## 2015-09-24 MED ORDER — FENTANYL CITRATE (PF) 100 MCG/2ML IJ SOLN
INTRAMUSCULAR | Status: DC | PRN
  Administered 2015-09-24: 25 ug via INTRAVENOUS

## 2015-09-24 MED ORDER — LEVOTHYROXINE SODIUM 75 MCG PO TABS
150.0000 ug | ORAL_TABLET | Freq: Every day | ORAL | Status: DC
  Administered 2015-09-24 – 2015-09-25 (×2): 150 ug via ORAL
  Filled 2015-09-24 (×2): qty 1
  Filled 2015-09-24: qty 2

## 2015-09-24 MED ORDER — SODIUM CHLORIDE 0.9 % IV SOLN: 250.0000 mL | INTRAVENOUS | Status: DC | PRN

## 2015-09-24 MED ORDER — METOPROLOL SUCCINATE ER 100 MG PO TB24
100.0000 mg | ORAL_TABLET | Freq: Every day | ORAL | Status: DC
  Administered 2015-09-24 – 2015-09-25 (×2): 100 mg via ORAL
  Filled 2015-09-24 (×3): qty 1

## 2015-09-24 MED ORDER — CLOPIDOGREL BISULFATE 75 MG PO TABS
ORAL_TABLET | ORAL | Status: AC
  Filled 2015-09-24: qty 8

## 2015-09-24 MED ORDER — CLOPIDOGREL BISULFATE 75 MG PO TABS
ORAL_TABLET | ORAL | Status: DC | PRN
  Administered 2015-09-24: 600 mg via ORAL

## 2015-09-24 MED ORDER — ALUM & MAG HYDROXIDE-SIMETH 200-200-20 MG/5ML PO SUSP
30.0000 mL | Freq: Once | ORAL | Status: AC
  Administered 2015-09-24: 30 mL via ORAL

## 2015-09-24 MED ORDER — OXYBUTYNIN CHLORIDE 5 MG PO TABS
5.0000 mg | ORAL_TABLET | Freq: Two times a day (BID) | ORAL | Status: DC
  Administered 2015-09-24 – 2015-09-25 (×2): 5 mg via ORAL
  Filled 2015-09-24 (×2): qty 1

## 2015-09-24 MED ORDER — OXYBUTYNIN CHLORIDE 5 MG/5ML PO SYRP: 5.0000 mg | ORAL_SOLUTION | Freq: Two times a day (BID) | ORAL | Status: DC

## 2015-09-24 MED ORDER — SODIUM CHLORIDE 0.9% FLUSH: 3.0000 mL | Freq: Two times a day (BID) | INTRAVENOUS | Status: DC

## 2015-09-24 MED ORDER — ASPIRIN 81 MG PO CHEW: 81.0000 mg | CHEWABLE_TABLET | ORAL | Status: DC

## 2015-09-24 MED ORDER — ALUM & MAG HYDROXIDE-SIMETH 200-200-20 MG/5ML PO SUSP
ORAL | Status: AC
  Administered 2015-09-24: 12:00:00
  Filled 2015-09-24: qty 30

## 2015-09-24 MED ORDER — NITROGLYCERIN 5 MG/ML IV SOLN
INTRAVENOUS | Status: AC
  Filled 2015-09-24: qty 10

## 2015-09-24 MED ORDER — ONDANSETRON HCL 4 MG/2ML IJ SOLN: 4.0000 mg | Freq: Four times a day (QID) | INTRAMUSCULAR | Status: DC | PRN

## 2015-09-24 MED ORDER — ISOSORBIDE MONONITRATE ER 30 MG PO TB24
30.0000 mg | ORAL_TABLET | Freq: Every day | ORAL | Status: DC
  Administered 2015-09-24 – 2015-09-25 (×2): 30 mg via ORAL
  Filled 2015-09-24 (×3): qty 1

## 2015-09-24 MED ORDER — ASPIRIN 81 MG PO CHEW
CHEWABLE_TABLET | ORAL | Status: DC | PRN
  Administered 2015-09-24: 324 mg via ORAL

## 2015-09-24 MED ORDER — DILTIAZEM HCL ER COATED BEADS 240 MG PO CP24
240.0000 mg | ORAL_CAPSULE | Freq: Every day | ORAL | Status: DC
  Administered 2015-09-24 – 2015-09-25 (×2): 240 mg via ORAL
  Filled 2015-09-24 (×2): qty 1

## 2015-09-24 SURGICAL SUPPLY — 16 items
BALLN TREK RX 2.25X12 (BALLOONS) ×3
BALLOON TREK RX 2.25X12 (BALLOONS) ×1 IMPLANT
CATH INFINITI 5FR ANG PIGTAIL (CATHETERS) ×3 IMPLANT
CATH INFINITI 5FR JL4 (CATHETERS) ×3 IMPLANT
CATH INFINITI JR4 5F (CATHETERS) ×3 IMPLANT
CATH VISTA GUIDE 6FR XB3.5 (CATHETERS) ×3 IMPLANT
DEVICE INFLAT 30 PLUS (MISCELLANEOUS) ×3 IMPLANT
KIT MANI 3VAL PERCEP (MISCELLANEOUS) ×3 IMPLANT
KIT TRANSPAC II SGL 4260605 (MISCELLANEOUS) ×3 IMPLANT
NEEDLE PERC 18GX7CM (NEEDLE) ×3 IMPLANT
PACK CARDIAC CATH (CUSTOM PROCEDURE TRAY) ×3 IMPLANT
SHEATH AVANTI 5FR X 11CM (SHEATH) ×3 IMPLANT
SHEATH AVANTI 6FR X 11CM (SHEATH) ×3 IMPLANT
STENT RESOLUTE INTEG 2.25X12 (Permanent Stent) ×3 IMPLANT
WIRE ASAHI PROWATER 180CM (WIRE) ×3 IMPLANT
WIRE EMERALD 3MM-J .035X150CM (WIRE) ×3 IMPLANT

## 2015-09-24 NOTE — Progress Notes (Signed)
angiomax gtt off 2/1/2 hours, sheath via right groin pulled per cath lab RN HAL, no bleeding nor hematoma at site, flat for 2 hours.  Vss, no complaints presently

## 2015-09-24 NOTE — Progress Notes (Signed)
Pt resting without complaints, sheath right groin has been pulled, no bleeding nor hematoma,  Report called to care nurse Abigail RN with plan reviewed. vss at this time.

## 2015-09-24 NOTE — Progress Notes (Signed)
Pt clinically stable post heart cath with stent placement per Dr Josefa Half, vss, sr per monitor,right groin with sheath patent, plan for pull in4 hours, after out of lab in recovery Dr Josefa Half in to speak with patient and family with questions answered, stated 4/10 jaw pain with no ekg changes, no chest pain, given norco tab, pt states now no pain at all, sr per monitor,no bleeding nor hematoma at right groin

## 2015-09-25 ENCOUNTER — Encounter: Payer: Self-pay | Admitting: Cardiology

## 2015-09-25 DIAGNOSIS — I25110 Atherosclerotic heart disease of native coronary artery with unstable angina pectoris: Secondary | ICD-10-CM | POA: Diagnosis not present

## 2015-09-25 LAB — BASIC METABOLIC PANEL
Anion gap: 7 (ref 5–15)
BUN: 17 mg/dL (ref 6–20)
CO2: 29 mmol/L (ref 22–32)
CREATININE: 1 mg/dL (ref 0.44–1.00)
Calcium: 8.5 mg/dL — ABNORMAL LOW (ref 8.9–10.3)
Chloride: 102 mmol/L (ref 101–111)
GFR, EST NON AFRICAN AMERICAN: 55 mL/min — AB (ref >60)
Glucose, Bld: 100 mg/dL — ABNORMAL HIGH (ref 65–99)
POTASSIUM: 2.9 mmol/L — AB (ref 3.5–5.1)
SODIUM: 138 mmol/L (ref 135–145)

## 2015-09-25 LAB — CBC
HCT: 38.1 % (ref 35.0–47.0)
Hemoglobin: 12.6 g/dL (ref 12.0–16.0)
MCH: 25.6 pg — ABNORMAL LOW (ref 26.0–34.0)
MCHC: 33.1 g/dL (ref 32.0–36.0)
MCV: 77.3 fL — ABNORMAL LOW (ref 80.0–100.0)
PLATELETS: 209 10*3/uL (ref 150–440)
RBC: 4.92 MIL/uL (ref 3.80–5.20)
RDW: 16.1 % — AB (ref 11.5–14.5)
WBC: 9.7 10*3/uL (ref 3.6–11.0)

## 2015-09-25 MED ORDER — POTASSIUM CHLORIDE CRYS ER 20 MEQ PO TBCR: 20.0000 meq | EXTENDED_RELEASE_TABLET | Freq: Once | ORAL | Status: DC

## 2015-09-25 MED ORDER — POTASSIUM CHLORIDE ER 10 MEQ PO TBCR: 10.0000 meq | EXTENDED_RELEASE_TABLET | Freq: Every day | ORAL | Status: DC

## 2015-09-25 MED ORDER — CLOPIDOGREL BISULFATE 75 MG PO TABS: 75.0000 mg | ORAL_TABLET | Freq: Every day | ORAL | Status: DC

## 2015-09-25 MED ORDER — POTASSIUM CHLORIDE CRYS ER 20 MEQ PO TBCR: 20.0000 meq | EXTENDED_RELEASE_TABLET | Freq: Two times a day (BID) | ORAL | Status: DC

## 2015-09-25 NOTE — Progress Notes (Signed)
Potassium 2.9 dr. Saralyn Pilar notified orders to give 68 Meq of Kdur now and will add 10 meq of Kdur for home and follow up in the office.

## 2015-09-25 NOTE — Discharge Instructions (Signed)
Hold Eliquis till after office visit

## 2015-09-25 NOTE — Progress Notes (Signed)
Patient discharged via wheelchair and private vehicle. IV removed and catheter intact. All discharge instructions given and patient verbalizes understanding. Tele removed and returned. Prescriptions given to patient No distress noted.   

## 2015-09-25 NOTE — Discharge Summary (Signed)
Physician Discharge Summary  Patient ID: Stacey Stone MRN: [id number] DOB/AGE: 72/22/1947 70 y.o.  Admit date: 09/24/2015 Discharge date: 09/25/2015  Primary Discharge Diagnosis Unstable angina Secondary Discharge Diagnosis coronary artery disease  Significant Diagnostic Studies: yes  Consults: None  Hospital Course: The patient underwent elective cardiac catheterization on 09/24/2015 which revealed chronically occluded proximal RCA and high-grade 90% stenosis D1. The patient underwent PCI, receiving a 12 5 x 12 mm Xience Alpine stent with an excellent angiographic result. The patient was returned to telemetry where she had an uncomplicated hospital course. On the morning of 09/25/2015 the patient was ambulating without difficulty and was discharged home.   Discharge Exam: Blood pressure 150/69, pulse 59, temperature 97.8 F (36.6 C), temperature source Oral, resp. rate 16, height 5\' 2"  (1.575 m), weight 85.639 kg (188 lb 12.8 oz), SpO2 93 %.   Head: Normocephalic, without obvious abnormality Eyes: conjunctivae/corneas clear. PERRL, EOM's intact. Fundi benign. Ears: normal TM's and external ear canals both ears Nose: Nares normal. Septum midline. Mucosa normal. No drainage or sinus tenderness. Throat: lips, mucosa, and tongue normal; teeth and gums normal Neck: no adenopathy, no carotid bruit, no JVD, supple, symmetrical, trachea midline and thyroid not enlarged, symmetric, no tenderness/mass/nodules Back: symmetric, no curvature. ROM normal. No CVA tenderness. Resp: clear to auscultation bilaterally Chest wall: no tenderness Cardio: regular rate and rhythm, S1, S2 normal, no murmur, click, rub or gallop GI: soft, non-tender; bowel sounds normal; no masses,  no organomegaly Extremities: extremities normal, atraumatic, no cyanosis or edema Pulses: 2+ and symmetric Skin: Skin color, texture, turgor normal. No rashes or lesions Labs:   Lab Results  Component Value Date   WBC  9.7 09/25/2015   HGB 12.6 09/25/2015   HCT 38.1 09/25/2015   MCV 77.3* 09/25/2015   PLT 209 09/25/2015    Recent Labs Lab 09/25/15 0332  NA 138  K 2.9*  CL 102  CO2 29  BUN 17  CREATININE 1.00  CALCIUM 8.5*  GLUCOSE 100*      Radiology:  EKG: Normal sinus rhythm  FOLLOW UP PLANS AND APPOINTMENTS    Medication List    ASK your doctor about these medications        Acetaminophen 500 MG coapsule  Take 1,000 mg by mouth every 4 (four) hours as needed for pain (for headache).     apixaban 5 MG Tabs tablet  Commonly known as:  ELIQUIS  Take 1 tablet (5 mg total) by mouth 2 (two) times daily.     aspirin EC 81 MG tablet  Take 81 mg by mouth daily.     diltiazem 240 MG 24 hr capsule  Commonly known as:  CARDIZEM CD  Take 240 mg by mouth daily.     Fluticasone-Salmeterol 100-50 MCG/DOSE Aepb  Commonly known as:  ADVAIR  Inhale 1 puff into the lungs 2 (two) times daily as needed (for asthma.).     ISOSORBIDE MONONITRATE ER PO  Take 30 mg by mouth daily.     levothyroxine 150 MCG tablet  Commonly known as:  SYNTHROID, LEVOTHROID  Take 150 mcg by mouth daily.     metoprolol succinate 100 MG 24 hr tablet  Commonly known as:  TOPROL-XL  Take 50 mg by mouth 2 (two) times daily.     multivitamin-lutein Caps capsule  Take 1 capsule by mouth daily.     nitroGLYCERIN 0.4 MG SL tablet  Commonly known as:  NITROSTAT  Place 0.4 mg under the tongue every  5 (five) minutes as needed for chest pain. Maximum 3 doses, If no relief call md or 911.     oxybutynin 5 MG tablet  Commonly known as:  DITROPAN  Take 5 mg by mouth daily.     potassium chloride SA 20 MEQ tablet  Commonly known as:  K-DUR,KLOR-CON  Take 30 mEq by mouth daily.     RABEprazole 20 MG tablet  Commonly known as:  ACIPHEX  Take 20 mg by mouth daily.     sertraline 50 MG tablet  Commonly known as:  ZOLOFT  Take 50 mg by mouth at bedtime.     simvastatin 40 MG tablet  Commonly known as:  ZOCOR   Take 40 mg by mouth at bedtime.     torsemide 20 MG tablet  Commonly known as:  DEMADEX  Take 30 mg by mouth daily.     VITAMIN D-1000 MAX ST 1000 units tablet  Generic drug:  Cholecalciferol  Take 1,000 Units by mouth daily.         BRING ALL MEDICATIONS WITH YOU TO FOLLOW UP APPOINTMENTS  Time spent with patient to include physician time: 25 minutes Signed:  Isaias Cowman MD, PhD, [facility] 09/25/2015, 7:12 AM

## 2015-10-02 ENCOUNTER — Encounter (INDEPENDENT_AMBULATORY_CARE_PROVIDER_SITE_OTHER): Payer: 59 | Admitting: Ophthalmology

## 2015-10-02 DIAGNOSIS — H34812 Central retinal vein occlusion, left eye, with macular edema: Secondary | ICD-10-CM

## 2015-10-02 DIAGNOSIS — I10 Essential (primary) hypertension: Secondary | ICD-10-CM

## 2015-10-02 DIAGNOSIS — H338 Other retinal detachments: Secondary | ICD-10-CM | POA: Diagnosis not present

## 2015-10-02 DIAGNOSIS — H43813 Vitreous degeneration, bilateral: Secondary | ICD-10-CM

## 2015-10-02 DIAGNOSIS — H35033 Hypertensive retinopathy, bilateral: Secondary | ICD-10-CM | POA: Diagnosis not present

## 2015-11-10 ENCOUNTER — Other Ambulatory Visit: Payer: Self-pay | Admitting: *Deleted

## 2015-11-10 DIAGNOSIS — C884 Extranodal marginal zone B-cell lymphoma of mucosa-associated lymphoid tissue [MALT-lymphoma]: Secondary | ICD-10-CM

## 2015-11-11 ENCOUNTER — Ambulatory Visit: Payer: 59

## 2015-11-11 ENCOUNTER — Ambulatory Visit (HOSPITAL_COMMUNITY): Payer: 59

## 2015-11-13 ENCOUNTER — Ambulatory Visit: Payer: 59 | Admitting: Oncology

## 2015-11-13 ENCOUNTER — Other Ambulatory Visit: Payer: 59

## 2015-11-14 ENCOUNTER — Ambulatory Visit
Admission: RE | Admit: 2015-11-14 | Discharge: 2015-11-14 | Disposition: A | Discharge status: Home / Self Care | Payer: Commercial Managed Care - HMO | Source: Ambulatory Visit | Attending: Oncology | Admitting: Oncology

## 2015-11-14 DIAGNOSIS — I2510 Atherosclerotic heart disease of native coronary artery without angina pectoris: Secondary | ICD-10-CM | POA: Insufficient documentation

## 2015-11-14 DIAGNOSIS — N289 Disorder of kidney and ureter, unspecified: Secondary | ICD-10-CM | POA: Diagnosis not present

## 2015-11-14 DIAGNOSIS — D259 Leiomyoma of uterus, unspecified: Secondary | ICD-10-CM | POA: Insufficient documentation

## 2015-11-14 DIAGNOSIS — G4730 Sleep apnea, unspecified: Secondary | ICD-10-CM

## 2015-11-14 DIAGNOSIS — I700 Atherosclerosis of aorta: Secondary | ICD-10-CM | POA: Insufficient documentation

## 2015-11-14 DIAGNOSIS — M1990 Unspecified osteoarthritis, unspecified site: Secondary | ICD-10-CM

## 2015-11-14 DIAGNOSIS — C884 Extranodal marginal zone B-cell lymphoma of mucosa-associated lymphoid tissue [MALT-lymphoma]: Secondary | ICD-10-CM | POA: Insufficient documentation

## 2015-11-14 HISTORY — DX: Unspecified osteoarthritis, unspecified site: M19.90

## 2015-11-14 HISTORY — DX: Sleep apnea, unspecified: G47.30

## 2015-11-14 HISTORY — DX: Atherosclerotic heart disease of native coronary artery without angina pectoris: I25.10

## 2015-11-14 LAB — POCT I-STAT CREATININE: CREATININE: 1 mg/dL (ref 0.44–1.00)

## 2015-11-14 MED ORDER — IOPAMIDOL (ISOVUE-300) INJECTION 61%
100.0000 mL | Freq: Once | INTRAVENOUS | Status: AC | PRN
  Administered 2015-11-14: 100 mL via INTRAVENOUS

## 2015-11-16 NOTE — Progress Notes (Signed)
[place]  [phone]  Fax:(336) [phone]     Stacey Stone DOB: 11/16/1945  MR#: [id number]  [id number]  Patient Care Team: Rusty Aus, MD as PCP - General (Internal Medicine)  CHIEF COMPLAINT: MALT Stage Ie  INTERVAL HISTORY:  Patient returns to clinic today for routine six-month follow-up and discussion of her imaging results. She continues to feel well and remains asymptomatic. She denies any fevers, night sweats, or weight loss. She has no neurologic complaints. She has noted no new lymphadenopathy. She has no chest pain or shortness of breath. She denies any nausea, vomiting, constipation, or diarrhea. She has no urinary complaints. Patient feels at her baseline and offers no specific complaints today.  REVIEW OF SYSTEMS:   Review of Systems  Constitutional: Negative for chills, diaphoresis, fever, malaise/fatigue and weight loss.  Respiratory: Negative.  Negative for cough and shortness of breath.   Cardiovascular: Negative.  Negative for chest pain.  Gastrointestinal: Negative.  Negative for abdominal pain.  Genitourinary: Negative.   Musculoskeletal: Negative.   Neurological: Negative.  Negative for weakness.  Psychiatric/Behavioral: Negative.  The patient is not nervous/anxious.     As per HPI. Otherwise, a complete review of systems is negative.  ONCOLOGY HISTORY:   MALT (mucosa associated lymphoid tissue) ([place])   07/05/2014 Initial Diagnosis    MALT (mucosa associated lymphoid tissue)       PAST MEDICAL HISTORY: Past Medical History:  Diagnosis Date  . Anginal pain ([place])   . Arthritis t   knee  . 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 Mamie Nick. at [facility] in [place]  . Depression   . Dysrhythmia   . GERD (gastroesophageal reflux disease)   . Hypertension   . Hypothyroidism   . MALT (mucosa associated lymphoid tissue) ([place]) 07/05/2014   Right neck mass resected 01/2014.  Marland Kitchen No  pertinent past medical history   . Sleep apnea t   O2- 2l at bedtime and BiPap @ bedtime    PAST SURGICAL HISTORY: Past Surgical History:  Procedure Laterality Date  . CARDIAC CATHETERIZATION    . CARDIAC CATHETERIZATION N/A 09/24/2015   Procedure: Left Heart Cath and Coronary Angiography;  Surgeon: Isaias Cowman, MD;  Location: [facility];  Service: Cardiovascular;  Laterality: N/A;  . CARDIAC CATHETERIZATION N/A 09/24/2015   Procedure: Coronary Stent Intervention;  Surgeon: Isaias Cowman, MD;  Location: [facility];  Service: Cardiovascular;  Laterality: N/A;  . CHOLECYSTECTOMY  11/26   08/1970  . DILATATION & CURETTAGE/HYSTEROSCOPY WITH MYOSURE N/A 05/05/2015   Procedure: Hysteroscopy and endometrial curretage;  Surgeon: Boykin Nearing, MD;  Location: [facility];  Service: Gynecology;  Laterality: N/A;  . DILATION AND CURETTAGE OF UTERUS    . EXCISION MASS FROM NECK  Right    Dr. Tami Ribas  . EYE SURGERY  t   bil cataract 9/08  . HAMMER TOE SURGERY Right   . JOINT REPLACEMENT Bilateral 2008, 11/26/ 2012   Partial Knee Replacements  . SCLERAL BUCKLE  02/16/2011   Procedure: SCLERAL BUCKLE;  Surgeon: Hayden Pedro, MD;  Location: [facility];  Service: Ophthalmology;  Laterality: Left;  Scleral Buckle Left Eye with Headscope Laser    FAMILY HISTORY Family History  Problem Relation Age of Onset  . Anesthesia problems Neg Hx   . Hypotension Neg Hx   . Malignant hyperthermia Neg Hx   . Pseudochol deficiency Neg Hx   . Breast cancer Mother 58  .  Breast cancer Paternal Aunt 73  . Heart disease Father     GYNECOLOGIC HISTORY:  No LMP recorded. Patient is postmenopausal.     ADVANCED DIRECTIVES:    HEALTH MAINTENANCE: Social History  Substance Use Topics  . Smoking status: Never Smoker  . Smokeless tobacco: Never Used  . Alcohol use No     Colonoscopy:  PAP:  Bone density:  Mammogram: November 2016  Allergies  Allergen Reactions  .  Codeine Other (See Comments)    Stroke like symptoms    Current Outpatient Prescriptions  Medication Sig Dispense Refill  . Acetaminophen 500 MG coapsule Take 1,000 mg by mouth every 4 (four) hours as needed for pain (for headache).     Marland Kitchen apixaban (ELIQUIS) 5 MG TABS tablet Take 1 tablet (5 mg total) by mouth 2 (two) times daily. 60 tablet 1  . aspirin EC 81 MG tablet Take 81 mg by mouth daily.      . Cholecalciferol (VITAMIN D-1000 MAX ST) 1000 units tablet Take 1,000 Units by mouth daily.    . clopidogrel (PLAVIX) 75 MG tablet Take 1 tablet (75 mg total) by mouth daily with breakfast. 30 tablet 5  . diltiazem (CARDIZEM CD) 240 MG 24 hr capsule Take 240 mg by mouth daily.      . Fluticasone-Salmeterol (ADVAIR) 100-50 MCG/DOSE AEPB Inhale 1 puff into the lungs 2 (two) times daily as needed (for asthma.).    Marland Kitchen ISOSORBIDE MONONITRATE ER PO Take 30 mg by mouth daily.    Marland Kitchen levothyroxine (SYNTHROID, LEVOTHROID) 150 MCG tablet Take 150 mcg by mouth daily.      . metoprolol (TOPROL-XL) 100 MG 24 hr tablet Take 50 mg by mouth 2 (two) times daily.     . multivitamin-lutein (OCUVITE-LUTEIN) CAPS capsule Take 1 capsule by mouth daily.    . nitroGLYCERIN (NITROSTAT) 0.4 MG SL tablet Place 0.4 mg under the tongue every 5 (five) minutes as needed for chest pain. Maximum 3 doses, If no relief call md or 911.    Marland Kitchen oxybutynin (DITROPAN) 5 MG tablet Take 5 mg by mouth daily.    . potassium chloride (K-DUR) 10 MEQ tablet Take 1 tablet (10 mEq total) by mouth daily. 30 tablet 5  . potassium chloride SA (K-DUR,KLOR-CON) 20 MEQ tablet Take 30 mEq by mouth daily.      . RABEprazole (ACIPHEX) 20 MG tablet Take 20 mg by mouth daily.      . sertraline (ZOLOFT) 50 MG tablet Take 50 mg by mouth at bedtime.      . simvastatin (ZOCOR) 40 MG tablet Take 40 mg by mouth at bedtime.      . torsemide (DEMADEX) 20 MG tablet Take 30 mg by mouth daily.       No current facility-administered medications for this visit.      OBJECTIVE: There were no vitals taken for this visit.   There is no height or weight on file to calculate BMI.    ECOG FS:0 - Asymptomatic  General: Well-developed, well-nourished, no acute distress. Eyes: Pink conjunctiva, anicteric sclera. HEENT: Normocephalic, moist mucous membranes, clear oropharnyx. No palpable lymphadenopathy. Lungs: Clear to auscultation bilaterally. Heart: Regular rate and rhythm. No rubs, murmurs, or gallops. Abdomen: Soft, nontender, nondistended. No organomegaly noted, normoactive bowel sounds. Musculoskeletal: No edema, cyanosis, or clubbing. Neuro: Alert, answering all questions appropriately. Cranial nerves grossly intact. Skin: No rashes or petechiae noted. Psych: Normal affect. Lymphatics: No cervical, clavicular, or axillary LAD.   LAB RESULTS:  No  visits with results within 3 Day(s) from this visit.  Latest known visit with results is:  Hospital Outpatient Visit on 11/14/2015  Component Date Value Ref Range Status  . Creatinine, Ser 11/14/2015 1.00  0.44 - 1.00 mg/dL Final    STUDIES: Ct Chest W Contrast  Result Date: 11/14/2015 CLINICAL DATA:  An ALT lymphoma diagnosed 6/15. Surgical resection 11/15. Asymptomatic. Cholecystectomy 45 years ago. EXAM: CT CHEST, ABDOMEN, AND PELVIS WITH CONTRAST TECHNIQUE: Multidetector CT imaging of the chest, abdomen and pelvis was performed following the standard protocol during bolus administration of intravenous contrast. CONTRAST:  168mL ISOVUE-300 IOPAMIDOL (ISOVUE-300) INJECTION 61% COMPARISON:  03/11/2014 PET. FINDINGS: CT CHEST FINDINGS Cardiovascular: Mildly tortuous thoracic aorta with atherosclerosis within. Mild cardiomegaly. Multivessel coronary artery atherosclerosis. No central pulmonary embolism, on this non-dedicated study. Enlargement of pulmonary arteries, with a 3.4 cm outflow tract. Mediastinum/Nodes: No supraclavicular adenopathy. No mediastinal or hilar adenopathy. No axillary adenopathy. No  internal mammary adenopathy. Lungs/Pleura: No pleural fluid.  Clear lungs. Musculoskeletal: No acute osseous abnormality. CT ABDOMEN PELVIS FINDINGS Hepatobiliary: Normal liver. Cholecystectomy, without biliary ductal dilatation. Pancreas: Fatty atrophy involving the pancreatic head and uncinate process. No pancreatic duct dilatation or acute pancreatitis. Spleen: Normal in size, without focal abnormality. Adrenals/Urinary Tract: Normal adrenal glands. Normal right kidney. An interpolar left renal lesion is too small to characterize at 7 mm on image 67/series 2. This likely measures greater than fluid density, but is hyper attenuating on 03/11/2014 PET No hydronephrosis.  Normal urinary bladder. Stomach/Bowel: Normal stomach, without wall thickening. Scattered colonic diverticula. Multiple areas of colonic underdistention. Normal terminal ileum. Normal small bowel. Vascular/Lymphatic: Aortic and branch vessel atherosclerosis. Posterior left periaortic node measures 7 mm on image 68/ series 2. Compare 11 mm on the prior PET. Not pathologic by size criteria. No abdominopelvic adenopathy. Reproductive: Hyperenhancing posterior uterine body fibroid measures 2.4 cm on sagittal image 112. No adnexal mass. Other: No significant free fluid. No evidence of omental or peritoneal disease. Musculoskeletal: Degenerate disc disease throughout the lumbosacral spine. IMPRESSION: 1. No evidence of adenopathy throughout the chest, abdomen, or pelvis to suggest active lymphoma. The hypermetabolic left periaortic abdominal node described on the prior PET is decreased in size and not pathologic by size criteria. 2.  Coronary artery atherosclerosis. Aortic atherosclerosis. 3. Pulmonary artery enlargement suggests pulmonary arterial hypertension. 4. Uterine fibroid. 5. A left renal lesion which is most likely a complex cyst. Technically too small to characterize. Recommend attention on follow-up. Electronically Signed   By: Abigail Miyamoto  M.D.   On: 11/14/2015 15:14   Ct Abdomen Pelvis W Contrast  Result Date: 11/14/2015 CLINICAL DATA:  An ALT lymphoma diagnosed 6/15. Surgical resection 11/15. Asymptomatic. Cholecystectomy 45 years ago. EXAM: CT CHEST, ABDOMEN, AND PELVIS WITH CONTRAST TECHNIQUE: Multidetector CT imaging of the chest, abdomen and pelvis was performed following the standard protocol during bolus administration of intravenous contrast. CONTRAST:  169mL ISOVUE-300 IOPAMIDOL (ISOVUE-300) INJECTION 61% COMPARISON:  03/11/2014 PET. FINDINGS: CT CHEST FINDINGS Cardiovascular: Mildly tortuous thoracic aorta with atherosclerosis within. Mild cardiomegaly. Multivessel coronary artery atherosclerosis. No central pulmonary embolism, on this non-dedicated study. Enlargement of pulmonary arteries, with a 3.4 cm outflow tract. Mediastinum/Nodes: No supraclavicular adenopathy. No mediastinal or hilar adenopathy. No axillary adenopathy. No internal mammary adenopathy. Lungs/Pleura: No pleural fluid.  Clear lungs. Musculoskeletal: No acute osseous abnormality. CT ABDOMEN PELVIS FINDINGS Hepatobiliary: Normal liver. Cholecystectomy, without biliary ductal dilatation. Pancreas: Fatty atrophy involving the pancreatic head and uncinate process. No pancreatic duct dilatation or acute pancreatitis. Spleen:  Normal in size, without focal abnormality. Adrenals/Urinary Tract: Normal adrenal glands. Normal right kidney. An interpolar left renal lesion is too small to characterize at 7 mm on image 67/series 2. This likely measures greater than fluid density, but is hyper attenuating on 03/11/2014 PET No hydronephrosis.  Normal urinary bladder. Stomach/Bowel: Normal stomach, without wall thickening. Scattered colonic diverticula. Multiple areas of colonic underdistention. Normal terminal ileum. Normal small bowel. Vascular/Lymphatic: Aortic and branch vessel atherosclerosis. Posterior left periaortic node measures 7 mm on image 68/ series 2. Compare 11 mm on  the prior PET. Not pathologic by size criteria. No abdominopelvic adenopathy. Reproductive: Hyperenhancing posterior uterine body fibroid measures 2.4 cm on sagittal image 112. No adnexal mass. Other: No significant free fluid. No evidence of omental or peritoneal disease. Musculoskeletal: Degenerate disc disease throughout the lumbosacral spine. IMPRESSION: 1. No evidence of adenopathy throughout the chest, abdomen, or pelvis to suggest active lymphoma. The hypermetabolic left periaortic abdominal node described on the prior PET is decreased in size and not pathologic by size criteria. 2.  Coronary artery atherosclerosis. Aortic atherosclerosis. 3. Pulmonary artery enlargement suggests pulmonary arterial hypertension. 4. Uterine fibroid. 5. A left renal lesion which is most likely a complex cyst. Technically too small to characterize. Recommend attention on follow-up. Electronically Signed   By: Abigail Miyamoto M.D.   On: 11/14/2015 15:14    ASSESSMENT: MALT Stage Ie  PLAN:     1. MALT. Patient is status post excision of a right submandibular gland in 2015. CT scan results reviewed independently and reported as above with no obvious evidence of recurrent disease. No intervention is needed at this time. Patient is now 2 years removed from her treatment and can be switched to yearly imaging. Return to clinic in 6 months for further evaluation. Will repeat CT scan in August 2018.   Patient expressed understanding and was in agreement with this plan. She also understands that She can call clinic at any time with any questions, concerns, or complaints.     Lloyd Huger, MD   11/16/2015 11:55 PM

## 2015-11-17 ENCOUNTER — Inpatient Hospital Stay (HOSPITAL_BASED_OUTPATIENT_CLINIC_OR_DEPARTMENT_OTHER): Payer: 59 | Admitting: Oncology

## 2015-11-17 ENCOUNTER — Other Ambulatory Visit: Payer: 59

## 2015-11-17 ENCOUNTER — Inpatient Hospital Stay: Payer: 59 | Attending: Oncology

## 2015-11-17 ENCOUNTER — Ambulatory Visit: Payer: 59 | Admitting: Oncology

## 2015-11-17 ENCOUNTER — Encounter (INDEPENDENT_AMBULATORY_CARE_PROVIDER_SITE_OTHER): Payer: Self-pay

## 2015-11-17 VITALS — BP 123/72 | HR 54 | Temp 98.7°F | Resp 18 | Wt 189.6 lb

## 2015-11-17 DIAGNOSIS — G4730 Sleep apnea, unspecified: Secondary | ICD-10-CM | POA: Diagnosis not present

## 2015-11-17 DIAGNOSIS — K219 Gastro-esophageal reflux disease without esophagitis: Secondary | ICD-10-CM | POA: Diagnosis not present

## 2015-11-17 DIAGNOSIS — Z803 Family history of malignant neoplasm of breast: Secondary | ICD-10-CM | POA: Insufficient documentation

## 2015-11-17 DIAGNOSIS — I2510 Atherosclerotic heart disease of native coronary artery without angina pectoris: Secondary | ICD-10-CM | POA: Insufficient documentation

## 2015-11-17 DIAGNOSIS — Z7982 Long term (current) use of aspirin: Secondary | ICD-10-CM

## 2015-11-17 DIAGNOSIS — M1990 Unspecified osteoarthritis, unspecified site: Secondary | ICD-10-CM | POA: Insufficient documentation

## 2015-11-17 DIAGNOSIS — Z9981 Dependence on supplemental oxygen: Secondary | ICD-10-CM

## 2015-11-17 DIAGNOSIS — I10 Essential (primary) hypertension: Secondary | ICD-10-CM | POA: Insufficient documentation

## 2015-11-17 DIAGNOSIS — C884 Extranodal marginal zone B-cell lymphoma of mucosa-associated lymphoid tissue [MALT-lymphoma]: Secondary | ICD-10-CM

## 2015-11-17 DIAGNOSIS — Z79899 Other long term (current) drug therapy: Secondary | ICD-10-CM | POA: Diagnosis not present

## 2015-11-17 DIAGNOSIS — E039 Hypothyroidism, unspecified: Secondary | ICD-10-CM

## 2015-11-17 LAB — CBC WITH DIFFERENTIAL/PLATELET
BASOS ABS: 0.1 10*3/uL (ref 0–0.1)
Basophils Relative: 1 %
EOS ABS: 0.2 10*3/uL (ref 0–0.7)
EOS PCT: 2 %
HCT: 40.2 % (ref 35.0–47.0)
Hemoglobin: 13.5 g/dL (ref 12.0–16.0)
LYMPHS ABS: 2.9 10*3/uL (ref 1.0–3.6)
Lymphocytes Relative: 29 %
MCH: 26.2 pg (ref 26.0–34.0)
MCHC: 33.6 g/dL (ref 32.0–36.0)
MCV: 78 fL — AB (ref 80.0–100.0)
MONO ABS: 0.7 10*3/uL (ref 0.2–0.9)
Monocytes Relative: 7 %
Neutro Abs: 6.1 10*3/uL (ref 1.4–6.5)
Neutrophils Relative %: 61 %
PLATELETS: 251 10*3/uL (ref 150–440)
RBC: 5.16 MIL/uL (ref 3.80–5.20)
RDW: 17.1 % — AB (ref 11.5–14.5)
WBC: 10 10*3/uL (ref 3.6–11.0)

## 2015-11-17 LAB — COMPREHENSIVE METABOLIC PANEL
ALK PHOS: 162 U/L — AB (ref 38–126)
ALT: 41 U/L (ref 14–54)
ANION GAP: 9 (ref 5–15)
AST: 36 U/L (ref 15–41)
Albumin: 4.1 g/dL (ref 3.5–5.0)
BILIRUBIN TOTAL: 0.3 mg/dL (ref 0.3–1.2)
BUN: 16 mg/dL (ref 6–20)
CALCIUM: 9.1 mg/dL (ref 8.9–10.3)
CO2: 27 mmol/L (ref 22–32)
Chloride: 99 mmol/L — ABNORMAL LOW (ref 101–111)
Creatinine, Ser: 1.1 mg/dL — ABNORMAL HIGH (ref 0.44–1.00)
GFR calc non Af Amer: 49 mL/min — ABNORMAL LOW (ref >60)
GFR, EST AFRICAN AMERICAN: 57 mL/min — AB (ref >60)
GLUCOSE: 112 mg/dL — AB (ref 65–99)
POTASSIUM: 3.5 mmol/L (ref 3.5–5.1)
SODIUM: 135 mmol/L (ref 135–145)
TOTAL PROTEIN: 7.6 g/dL (ref 6.5–8.1)

## 2015-11-17 NOTE — Progress Notes (Signed)
States is feeling well. Offers no complaints. 

## 2015-11-27 ENCOUNTER — Encounter (INDEPENDENT_AMBULATORY_CARE_PROVIDER_SITE_OTHER): Payer: 59 | Admitting: Ophthalmology

## 2015-11-27 DIAGNOSIS — H43813 Vitreous degeneration, bilateral: Secondary | ICD-10-CM

## 2015-11-27 DIAGNOSIS — H338 Other retinal detachments: Secondary | ICD-10-CM

## 2015-11-27 DIAGNOSIS — H35033 Hypertensive retinopathy, bilateral: Secondary | ICD-10-CM | POA: Diagnosis not present

## 2015-11-27 DIAGNOSIS — I10 Essential (primary) hypertension: Secondary | ICD-10-CM

## 2015-11-27 DIAGNOSIS — H34812 Central retinal vein occlusion, left eye, with macular edema: Secondary | ICD-10-CM

## 2016-01-08 ENCOUNTER — Encounter (INDEPENDENT_AMBULATORY_CARE_PROVIDER_SITE_OTHER): Payer: 59 | Admitting: Ophthalmology

## 2016-01-08 DIAGNOSIS — H35033 Hypertensive retinopathy, bilateral: Secondary | ICD-10-CM | POA: Diagnosis not present

## 2016-01-08 DIAGNOSIS — H34812 Central retinal vein occlusion, left eye, with macular edema: Secondary | ICD-10-CM | POA: Diagnosis not present

## 2016-01-08 DIAGNOSIS — I10 Essential (primary) hypertension: Secondary | ICD-10-CM

## 2016-01-08 DIAGNOSIS — H338 Other retinal detachments: Secondary | ICD-10-CM

## 2016-01-08 DIAGNOSIS — H43813 Vitreous degeneration, bilateral: Secondary | ICD-10-CM

## 2016-02-25 ENCOUNTER — Other Ambulatory Visit: Payer: Self-pay | Admitting: Internal Medicine

## 2016-02-25 DIAGNOSIS — Z1231 Encounter for screening mammogram for malignant neoplasm of breast: Secondary | ICD-10-CM

## 2016-02-26 ENCOUNTER — Encounter (INDEPENDENT_AMBULATORY_CARE_PROVIDER_SITE_OTHER): Payer: 59 | Admitting: Ophthalmology

## 2016-02-26 DIAGNOSIS — H338 Other retinal detachments: Secondary | ICD-10-CM

## 2016-02-26 DIAGNOSIS — I10 Essential (primary) hypertension: Secondary | ICD-10-CM | POA: Diagnosis not present

## 2016-02-26 DIAGNOSIS — H43813 Vitreous degeneration, bilateral: Secondary | ICD-10-CM | POA: Diagnosis not present

## 2016-02-26 DIAGNOSIS — H34812 Central retinal vein occlusion, left eye, with macular edema: Secondary | ICD-10-CM

## 2016-02-26 DIAGNOSIS — H35033 Hypertensive retinopathy, bilateral: Secondary | ICD-10-CM | POA: Diagnosis not present

## 2016-02-26 DIAGNOSIS — H34831 Tributary (branch) retinal vein occlusion, right eye, with macular edema: Secondary | ICD-10-CM | POA: Diagnosis not present

## 2016-04-01 ENCOUNTER — Ambulatory Visit
Admission: RE | Admit: 2016-04-01 | Discharge: 2016-04-01 | Disposition: A | Discharge status: Home / Self Care | Payer: 59 | Source: Ambulatory Visit | Attending: Internal Medicine | Admitting: Internal Medicine

## 2016-04-01 DIAGNOSIS — Z1231 Encounter for screening mammogram for malignant neoplasm of breast: Secondary | ICD-10-CM | POA: Diagnosis not present

## 2016-04-06 ENCOUNTER — Other Ambulatory Visit: Payer: Self-pay | Admitting: Internal Medicine

## 2016-04-06 ENCOUNTER — Other Ambulatory Visit: Payer: Self-pay | Admitting: Obstetrics and Gynecology

## 2016-04-06 DIAGNOSIS — R928 Other abnormal and inconclusive findings on diagnostic imaging of breast: Secondary | ICD-10-CM

## 2016-04-06 DIAGNOSIS — N6320 Unspecified lump in the left breast, unspecified quadrant: Secondary | ICD-10-CM

## 2016-04-08 ENCOUNTER — Encounter (INDEPENDENT_AMBULATORY_CARE_PROVIDER_SITE_OTHER): Payer: 59 | Admitting: Ophthalmology

## 2016-04-15 ENCOUNTER — Encounter (INDEPENDENT_AMBULATORY_CARE_PROVIDER_SITE_OTHER): Payer: 59 | Admitting: Ophthalmology

## 2016-04-15 DIAGNOSIS — H43813 Vitreous degeneration, bilateral: Secondary | ICD-10-CM

## 2016-04-15 DIAGNOSIS — H34831 Tributary (branch) retinal vein occlusion, right eye, with macular edema: Secondary | ICD-10-CM

## 2016-04-15 DIAGNOSIS — H34812 Central retinal vein occlusion, left eye, with macular edema: Secondary | ICD-10-CM

## 2016-04-15 DIAGNOSIS — I10 Essential (primary) hypertension: Secondary | ICD-10-CM | POA: Diagnosis not present

## 2016-04-15 DIAGNOSIS — H35033 Hypertensive retinopathy, bilateral: Secondary | ICD-10-CM | POA: Diagnosis not present

## 2016-04-15 DIAGNOSIS — H338 Other retinal detachments: Secondary | ICD-10-CM | POA: Diagnosis not present

## 2016-04-16 ENCOUNTER — Ambulatory Visit
Admission: RE | Admit: 2016-04-16 | Discharge: 2016-04-16 | Disposition: A | Discharge status: Home / Self Care | Payer: 59 | Source: Ambulatory Visit

## 2016-04-16 ENCOUNTER — Ambulatory Visit
Admission: RE | Admit: 2016-04-16 | Discharge: 2016-04-16 | Disposition: A | Discharge status: Home / Self Care | Payer: 59 | Source: Ambulatory Visit | Attending: Internal Medicine | Admitting: Internal Medicine

## 2016-04-16 DIAGNOSIS — N6323 Unspecified lump in the left breast, lower outer quadrant: Secondary | ICD-10-CM | POA: Diagnosis not present

## 2016-04-16 DIAGNOSIS — R928 Other abnormal and inconclusive findings on diagnostic imaging of breast: Secondary | ICD-10-CM | POA: Diagnosis not present

## 2016-04-16 DIAGNOSIS — N6320 Unspecified lump in the left breast, unspecified quadrant: Secondary | ICD-10-CM | POA: Insufficient documentation

## 2016-04-16 DIAGNOSIS — N6321 Unspecified lump in the left breast, upper outer quadrant: Secondary | ICD-10-CM | POA: Diagnosis not present

## 2016-04-16 DIAGNOSIS — N6489 Other specified disorders of breast: Secondary | ICD-10-CM | POA: Diagnosis not present

## 2016-04-21 ENCOUNTER — Other Ambulatory Visit: Payer: Self-pay | Admitting: Internal Medicine

## 2016-04-21 DIAGNOSIS — R928 Other abnormal and inconclusive findings on diagnostic imaging of breast: Secondary | ICD-10-CM

## 2016-04-21 DIAGNOSIS — N6320 Unspecified lump in the left breast, unspecified quadrant: Secondary | ICD-10-CM

## 2016-05-04 ENCOUNTER — Ambulatory Visit
Admission: RE | Admit: 2016-05-04 | Discharge: 2016-05-04 | Disposition: A | Discharge status: Home / Self Care | Payer: 59 | Source: Ambulatory Visit | Attending: Internal Medicine | Admitting: Internal Medicine

## 2016-05-04 DIAGNOSIS — R928 Other abnormal and inconclusive findings on diagnostic imaging of breast: Secondary | ICD-10-CM

## 2016-05-04 DIAGNOSIS — C50912 Malignant neoplasm of unspecified site of left female breast: Secondary | ICD-10-CM | POA: Diagnosis not present

## 2016-05-04 DIAGNOSIS — N6320 Unspecified lump in the left breast, unspecified quadrant: Secondary | ICD-10-CM | POA: Diagnosis present

## 2016-05-04 DIAGNOSIS — C50812 Malignant neoplasm of overlapping sites of left female breast: Secondary | ICD-10-CM | POA: Diagnosis not present

## 2016-05-07 ENCOUNTER — Other Ambulatory Visit: Payer: Self-pay | Admitting: Internal Medicine

## 2016-05-07 DIAGNOSIS — D0512 Intraductal carcinoma in situ of left breast: Secondary | ICD-10-CM | POA: Diagnosis not present

## 2016-05-07 DIAGNOSIS — R102 Pelvic and perineal pain: Secondary | ICD-10-CM | POA: Diagnosis not present

## 2016-05-07 LAB — SURGICAL PATHOLOGY

## 2016-05-09 DIAGNOSIS — C50412 Malignant neoplasm of upper-outer quadrant of left female breast: Secondary | ICD-10-CM | POA: Insufficient documentation

## 2016-05-09 NOTE — Progress Notes (Signed)
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  INTERVAL HISTORY:  Patient is a 71 year old female with a history of stage I MALT lymphoma who was found to have an abnormality on routine screening mammogram. Subsequent ultrasound biopsy revealed the above stated breast cancer. She currently feels well and remains asymptomatic. She denies any fevers, night sweats, or weight loss. She has no neurologic complaints. She has noted no new lymphadenopathy. She has no chest pain or shortness of breath. She denies any nausea, vomiting, constipation, or diarrhea. She has no urinary complaints. Patient feels at her baseline and offers no specific complaints today.  REVIEW OF SYSTEMS:   Review of Systems  Constitutional: Negative for chills, diaphoresis, fever, malaise/fatigue and weight loss.  Respiratory: Negative.  Negative for cough and shortness of breath.   Cardiovascular: Negative.  Negative for chest pain.  Gastrointestinal: Negative.  Negative for abdominal pain.  Genitourinary: Negative.   Musculoskeletal: Negative.   Neurological: Negative.  Negative for weakness.  Psychiatric/Behavioral: Negative.  The patient is not nervous/anxious.     As per HPI. Otherwise, a complete review of systems is negative.  ONCOLOGY HISTORY:   MALT (mucosa associated lymphoid tissue) ([place])   07/05/2014 Initial Diagnosis    MALT (mucosa associated lymphoid tissue)       PAST MEDICAL HISTORY: Past Medical History:  Diagnosis Date  . Anginal pain ([place])   . Arthritis t   knee  . 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 Mamie Nick. at [facility]  in [place]  . Depression   . Dysrhythmia   . GERD (gastroesophageal reflux disease)   . Hypertension   . Hypothyroidism   . MALT (mucosa associated lymphoid tissue) ([place]) 07/05/2014   Right neck mass resected 01/2014.  Marland Kitchen No pertinent past medical history   . Sleep apnea t   O2- 2l at bedtime and BiPap @ bedtime    PAST SURGICAL HISTORY: Past Surgical History:  Procedure Laterality Date  . BREAST BIOPSY Left 2/136/2018   path pending  . CARDIAC CATHETERIZATION    . CARDIAC CATHETERIZATION N/A 09/24/2015   Procedure: Left Heart Cath and Coronary Angiography;  Surgeon: Isaias Cowman, MD;  Location: [facility];  Service: Cardiovascular;  Laterality: N/A;  . CARDIAC CATHETERIZATION N/A 09/24/2015   Procedure: Coronary Stent Intervention;  Surgeon: Isaias Cowman, MD;  Location: [facility];  Service: Cardiovascular;  Laterality: N/A;  . CHOLECYSTECTOMY  11/26   08/1970  . DILATATION & CURETTAGE/HYSTEROSCOPY WITH MYOSURE N/A 05/05/2015   Procedure: Hysteroscopy and endometrial curretage;  Surgeon: Boykin Nearing, MD;  Location: [facility];  Service: Gynecology;  Laterality: N/A;  . DILATION AND CURETTAGE OF UTERUS    . EXCISION MASS FROM NECK  Right    Dr. Tami Ribas  . EYE SURGERY  t   bil cataract 9/08  . HAMMER TOE SURGERY Right   . JOINT REPLACEMENT Bilateral 2008, 11/26/ 2012   Partial Knee Replacements  . SCLERAL BUCKLE  02/16/2011   Procedure: SCLERAL BUCKLE;  Surgeon: Hayden Pedro, MD;  Location: [facility];  Service: Ophthalmology;  Laterality: Left;  Scleral Buckle Left Eye  with Headscope Laser    FAMILY HISTORY Family History  Problem Relation Age of Onset  . Breast cancer Mother 29  . Heart disease Father   . Breast cancer Paternal Aunt 73  . Breast cancer Cousin   . Anesthesia problems Neg Hx   . Hypotension Neg Hx   . Malignant hyperthermia Neg Hx   . Pseudochol deficiency Neg Hx     GYNECOLOGIC HISTORY:  No LMP recorded. Patient  is postmenopausal.     ADVANCED DIRECTIVES:    HEALTH MAINTENANCE: Social History  Substance Use Topics  . Smoking status: Never Smoker  . Smokeless tobacco: Never Used  . Alcohol use No     Colonoscopy:  PAP:  Bone density:  Mammogram: November 2016  Allergies  Allergen Reactions  . Codeine Other (See Comments)    Stroke like symptoms    Current Outpatient Prescriptions  Medication Sig Dispense Refill  . apixaban (ELIQUIS) 5 MG TABS tablet Take 5 mg by mouth 2 (two) times daily.    Marland Kitchen aspirin EC 81 MG tablet Take 81 mg by mouth daily.      . Cholecalciferol (VITAMIN D-1000 MAX ST) 1000 units tablet Take 1,000 Units by mouth daily.    . clopidogrel (PLAVIX) 75 MG tablet Take 1 tablet (75 mg total) by mouth daily with breakfast. 30 tablet 5  . diltiazem (CARDIZEM CD) 240 MG 24 hr capsule Take 240 mg by mouth daily.      . Fluticasone-Salmeterol (ADVAIR) 100-50 MCG/DOSE AEPB Inhale 1 puff into the lungs 2 (two) times daily as needed (for asthma.).    Marland Kitchen ISOSORBIDE MONONITRATE ER PO Take 30 mg by mouth daily.    Marland Kitchen levothyroxine (SYNTHROID, LEVOTHROID) 150 MCG tablet Take 150 mcg by mouth daily.      . metoprolol (TOPROL-XL) 100 MG 24 hr tablet Take 100 mg by mouth daily.     . multivitamin-lutein (OCUVITE-LUTEIN) CAPS capsule Take 1 capsule by mouth daily.    . nitroGLYCERIN (NITROSTAT) 0.4 MG SL tablet Place 0.4 mg under the tongue every 5 (five) minutes as needed for chest pain. Maximum 3 doses, If no relief call md or 911.    Marland Kitchen oxybutynin (DITROPAN) 5 MG tablet Take 5 mg by mouth daily.    . potassium chloride (K-DUR) 10 MEQ tablet Take 1 tablet (10 mEq total) by mouth daily. (Patient taking differently: Take 15 mEq by mouth daily. ) 30 tablet 5  . RABEprazole (ACIPHEX) 20 MG tablet Take 20 mg by mouth daily.      . sertraline (ZOLOFT) 50 MG tablet Take 50 mg by mouth at bedtime.      . simvastatin (ZOCOR) 40 MG tablet Take 40 mg by mouth at bedtime.      . torsemide  (DEMADEX) 20 MG tablet Take 30 mg by mouth daily.       No current facility-administered medications for this visit.     OBJECTIVE: BP (!) 162/82 (BP Location: Left Arm, Patient Position: Sitting)   Pulse (!) 56   Temp 97.4 F (36.3 C) (Tympanic)   Resp 18   Wt 192 lb 9.2 oz (87.4 kg)   BMI 35.22 kg/m    Body mass index is 35.22 kg/m.    ECOG FS:0 - Asymptomatic  General: Well-developed, well-nourished, no acute distress. Eyes: Pink conjunctiva, anicteric sclera. HEENT: Normocephalic, moist mucous membranes, clear oropharnyx. No palpable lymphadenopathy. Lungs: Clear to auscultation bilaterally. Heart: Regular rate and rhythm. No rubs, murmurs, or gallops. Abdomen: Soft,  nontender, nondistended. No organomegaly noted, normoactive bowel sounds. Musculoskeletal: No edema, cyanosis, or clubbing. Neuro: Alert, answering all questions appropriately. Cranial nerves grossly intact. Skin: No rashes or petechiae noted. Psych: Normal affect. Lymphatics: No cervical, clavicular, or axillary LAD.   LAB RESULTS:   STUDIES: No results found.  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  PLAN:    1. 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 Patient also noted to have an abnormality in her right breast and have recommended MRI to further evaluate prior to surgery. Once surgical pathology is obtained, will send results for Oncotype DX to assess whether adjuvant chemotherapy is necessary. Patient will likely require adjuvant XRT if she proceeds with lumpectomy. She will also benefit from an aromatase inhibitor for 5 years given the ER/PR positivity of her tumor. Return to clinic in 1-2 weeks after  surgery for further evaluation and additional treatment planning. 2. MALT. Patient is status post excision of a right submandibular gland in 2015. CT scan results reviewed independently and reported as above with no obvious evidence of recurrent disease. No intervention is needed at this time. Patient is now 2 years removed from her treatment and can be switched to yearly imaging. Return to clinic in 6 months for further evaluation. Will repeat CT scan in August 2018.   Approximately 45 minutes was spent in discussion of which greater than 50% was consultation.  Patient expressed understanding and was in agreement with this plan. She also understands that She can call clinic at any time with any questions, concerns, or complaints.     Lloyd Huger, MD   05/12/2016 8:41 AM

## 2016-05-10 ENCOUNTER — Inpatient Hospital Stay: Payer: 59 | Attending: Oncology | Admitting: Oncology

## 2016-05-10 DIAGNOSIS — E039 Hypothyroidism, unspecified: Secondary | ICD-10-CM | POA: Insufficient documentation

## 2016-05-10 DIAGNOSIS — I2510 Atherosclerotic heart disease of native coronary artery without angina pectoris: Secondary | ICD-10-CM | POA: Insufficient documentation

## 2016-05-10 DIAGNOSIS — K219 Gastro-esophageal reflux disease without esophagitis: Secondary | ICD-10-CM | POA: Diagnosis not present

## 2016-05-10 DIAGNOSIS — Z7982 Long term (current) use of aspirin: Secondary | ICD-10-CM | POA: Diagnosis not present

## 2016-05-10 DIAGNOSIS — C50412 Malignant neoplasm of upper-outer quadrant of left female breast: Secondary | ICD-10-CM | POA: Diagnosis not present

## 2016-05-10 DIAGNOSIS — Z9981 Dependence on supplemental oxygen: Secondary | ICD-10-CM

## 2016-05-10 DIAGNOSIS — G4730 Sleep apnea, unspecified: Secondary | ICD-10-CM | POA: Diagnosis not present

## 2016-05-10 DIAGNOSIS — Z170 Estrogen receptor positive status [ER+]: Secondary | ICD-10-CM | POA: Diagnosis not present

## 2016-05-10 DIAGNOSIS — I10 Essential (primary) hypertension: Secondary | ICD-10-CM | POA: Insufficient documentation

## 2016-05-10 DIAGNOSIS — Z79899 Other long term (current) drug therapy: Secondary | ICD-10-CM | POA: Diagnosis not present

## 2016-05-10 DIAGNOSIS — Z8572 Personal history of non-Hodgkin lymphomas: Secondary | ICD-10-CM

## 2016-05-10 DIAGNOSIS — M1990 Unspecified osteoarthritis, unspecified site: Secondary | ICD-10-CM | POA: Diagnosis not present

## 2016-05-10 DIAGNOSIS — Z7902 Long term (current) use of antithrombotics/antiplatelets: Secondary | ICD-10-CM

## 2016-05-10 NOTE — Progress Notes (Signed)
New evaluation for breast cancer. Has previous history of lymphoma. Offers no complaints today.

## 2016-05-11 NOTE — Progress Notes (Signed)
  Oncology Nurse Navigator Documentation  Navigator Location: [facility] (04/29/16 1600)   )Navigator Encounter Type: Introductory phone call (04/29/16 1600)   Abnormal Finding Date: 04/16/16 (04/29/16 1600) Confirmed Diagnosis Date: 05/04/16 (04/29/16 1600)               Patient Visit Type: Initial (04/29/16 1600)       Interventions: Education;Coordination of Care (04/29/16 1600)            Acuity: Level 1 (04/29/16 1600) Acuity Level 1: Initial guidance, education and coordination as needed (04/29/16 1600)       Time Spent with Patient: 30 (04/29/16 1600)  Phoned patient to introduce Navigation Service.  She states she is meeting with Dr, Emily Filbert on 05/07/16 to go over pathology, and make referral for Oncology.  She states she already sees Dr. Grayland Ormond for a lymphoma diagnosis.  Informed Dr. Sabra Heck that I have introduced navigation service to patient.  Left package with Dr. Ammie Ferrier nurse with Breast Cancer Treatment Handbook/folder with hospital services to be given to patient.

## 2016-05-11 NOTE — Progress Notes (Signed)
  Oncology Nurse Navigator Documentation  Navigator Location: [facility] (05/10/16 1655) Referral date to RadOnc/MedOnc: 05/10/16 (05/10/16 1655) )Navigator Encounter Type: Initial MedOnc;Education (05/10/16 1655)                     Patient Visit Type: MedOnc;Initial (05/10/16 1655) Treatment Phase: Pre-Tx/Tx Discussion (05/10/16 1655) Barriers/Navigation Needs: Education;Coordination of Care (05/10/16 1655) Education: Newly Diagnosed Cancer Education;Coping with Diagnosis/ Prognosis;Understanding Cancer/ Treatment Options;Concerns with Insurance Coverage (05/10/16 1655) Interventions: Coordination of Care (05/10/16 1655)              Acuity Level 1: Initial guidance, education and coordination as needed (05/10/16 1655)       Time Spent with Patient: 60 (05/10/16 1655)  Met patient, daughter, and best friend of 55 years at initial Med /Onc visit. She has Surgical Consult scheduled for 05/13/16 with Dr. Nicholes Stairs.  Scheduled patient for Breast MRI at [facility] on 05/14/16.  Plan is for patient to have surgery first, with Oncotype to be sent on surgical tissue.  She will call navigator to notify of surgery date, so follow-up with Dr. Grayland Ormond can be scheduled. History/risk factor data collected for Breast Case Conference.  Will discuss possibility of genetic testing with Dr. Grayland Ormond.

## 2016-05-12 ENCOUNTER — Other Ambulatory Visit: Payer: Self-pay

## 2016-05-12 ENCOUNTER — Ambulatory Visit
Admission: RE | Admit: 2016-05-12 | Discharge: 2016-05-12 | Disposition: A | Discharge status: Home / Self Care | Payer: 59 | Source: Ambulatory Visit | Attending: Internal Medicine | Admitting: Internal Medicine

## 2016-05-12 DIAGNOSIS — R102 Pelvic and perineal pain: Secondary | ICD-10-CM | POA: Insufficient documentation

## 2016-05-12 DIAGNOSIS — D259 Leiomyoma of uterus, unspecified: Secondary | ICD-10-CM | POA: Diagnosis not present

## 2016-05-12 DIAGNOSIS — C50919 Malignant neoplasm of unspecified site of unspecified female breast: Secondary | ICD-10-CM

## 2016-05-13 DIAGNOSIS — Z01810 Encounter for preprocedural cardiovascular examination: Secondary | ICD-10-CM | POA: Diagnosis not present

## 2016-05-13 DIAGNOSIS — I2510 Atherosclerotic heart disease of native coronary artery without angina pectoris: Secondary | ICD-10-CM | POA: Diagnosis not present

## 2016-05-13 DIAGNOSIS — Z170 Estrogen receptor positive status [ER+]: Secondary | ICD-10-CM | POA: Diagnosis not present

## 2016-05-13 DIAGNOSIS — C50412 Malignant neoplasm of upper-outer quadrant of left female breast: Secondary | ICD-10-CM | POA: Diagnosis not present

## 2016-05-13 DIAGNOSIS — I10 Essential (primary) hypertension: Secondary | ICD-10-CM | POA: Diagnosis not present

## 2016-05-13 HISTORY — DX: Encounter for preprocedural cardiovascular examination: Z01.810

## 2016-05-14 ENCOUNTER — Encounter (HOSPITAL_COMMUNITY): Payer: Self-pay

## 2016-05-14 ENCOUNTER — Ambulatory Visit (HOSPITAL_COMMUNITY): Admission: RE | Admit: 2016-05-14 | Payer: 59 | Source: Ambulatory Visit

## 2016-05-14 ENCOUNTER — Ambulatory Visit (HOSPITAL_COMMUNITY)
Admission: RE | Admit: 2016-05-14 | Discharge: 2016-05-14 | Disposition: A | Discharge status: Home / Self Care | Payer: 59 | Source: Ambulatory Visit | Attending: Oncology | Admitting: Oncology

## 2016-05-14 DIAGNOSIS — C50412 Malignant neoplasm of upper-outer quadrant of left female breast: Secondary | ICD-10-CM | POA: Diagnosis not present

## 2016-05-14 DIAGNOSIS — C50912 Malignant neoplasm of unspecified site of left female breast: Secondary | ICD-10-CM | POA: Diagnosis not present

## 2016-05-14 DIAGNOSIS — N6310 Unspecified lump in the right breast, unspecified quadrant: Secondary | ICD-10-CM | POA: Diagnosis not present

## 2016-05-14 LAB — POCT I-STAT CREATININE: CREATININE: 0.9 mg/dL (ref 0.44–1.00)

## 2016-05-14 MED ORDER — GADOBENATE DIMEGLUMINE 529 MG/ML IV SOLN
20.0000 mL | Freq: Once | INTRAVENOUS | Status: AC | PRN
  Administered 2016-05-14: 18 mL via INTRAVENOUS

## 2016-05-17 ENCOUNTER — Other Ambulatory Visit: Payer: Self-pay | Admitting: Surgery

## 2016-05-17 DIAGNOSIS — Z170 Estrogen receptor positive status [ER+]: Principal | ICD-10-CM

## 2016-05-17 DIAGNOSIS — C50412 Malignant neoplasm of upper-outer quadrant of left female breast: Secondary | ICD-10-CM

## 2016-05-18 ENCOUNTER — Other Ambulatory Visit: Payer: Self-pay | Admitting: Surgery

## 2016-05-18 ENCOUNTER — Other Ambulatory Visit: Payer: Self-pay | Admitting: Oncology

## 2016-05-18 ENCOUNTER — Other Ambulatory Visit: Payer: Self-pay | Admitting: *Deleted

## 2016-05-18 DIAGNOSIS — C50412 Malignant neoplasm of upper-outer quadrant of left female breast: Secondary | ICD-10-CM

## 2016-05-18 DIAGNOSIS — Z170 Estrogen receptor positive status [ER+]: Principal | ICD-10-CM

## 2016-05-18 NOTE — Progress Notes (Signed)
  Oncology Nurse Navigator Documentation  Navigator Location: [facility] (05/18/16 1100)   )Navigator Encounter Type: Telephone;Diagnostic Results (05/18/16 1100) Telephone: Incoming Call;Outgoing Call;Appt Confirmation/Clarification;Diagnostic Results (05/18/16 1100)                   Patient Visit Type: Follow-up (05/18/16 1100) Treatment Phase: Pre-Tx/Tx Discussion (05/18/16 1100) Barriers/Navigation Needs: Education;Coordination of Care (05/18/16 1100) Education: Coping with Diagnosis/ Prognosis;Newly Diagnosed Cancer Education (05/18/16 1100) Interventions: Coordination of Care (05/18/16 1100)   Coordination of Care: Radiology (MRI guided biopsy) (05/18/16 1100)        Acuity: Level 2 (05/18/16 1100)         Time Spent with Patient: 60 (05/18/16 1100)  Patient phoned with questioning follow-up appointment scheduled for 05/20/16 with Dr. Grayland Ormond.  Appointment cancelled.  Results from breast MRI discussed in Breast Case Conference recommending further bilateral MRI guided breast biopsy.  Discussed with Dr. Grayland Ormond and patient scheduled for biopsy on Monday 05/24/16 at 7:00. Notified Sherry at Dr. Thompson Caul office.  Plans to keep surgery date scheduled for 06/04/16 at this time.

## 2016-05-20 ENCOUNTER — Inpatient Hospital Stay: Payer: 59

## 2016-05-20 ENCOUNTER — Inpatient Hospital Stay: Payer: 59 | Admitting: Oncology

## 2016-05-24 ENCOUNTER — Ambulatory Visit
Admission: RE | Admit: 2016-05-24 | Discharge: 2016-05-24 | Disposition: A | Discharge status: Home / Self Care | Payer: 59 | Source: Ambulatory Visit | Attending: Oncology | Admitting: Oncology

## 2016-05-24 DIAGNOSIS — N6011 Diffuse cystic mastopathy of right breast: Secondary | ICD-10-CM | POA: Diagnosis not present

## 2016-05-24 DIAGNOSIS — N6489 Other specified disorders of breast: Secondary | ICD-10-CM | POA: Diagnosis not present

## 2016-05-24 DIAGNOSIS — C50412 Malignant neoplasm of upper-outer quadrant of left female breast: Secondary | ICD-10-CM

## 2016-05-24 DIAGNOSIS — N6323 Unspecified lump in the left breast, lower outer quadrant: Secondary | ICD-10-CM | POA: Diagnosis not present

## 2016-05-24 MED ORDER — GADOBENATE DIMEGLUMINE 529 MG/ML IV SOLN
19.0000 mL | Freq: Once | INTRAVENOUS | Status: AC | PRN
  Administered 2016-05-24: 19 mL via INTRAVENOUS

## 2016-05-25 HISTORY — PX: BREAST BIOPSY: SHX20

## 2016-05-27 ENCOUNTER — Encounter (INDEPENDENT_AMBULATORY_CARE_PROVIDER_SITE_OTHER): Payer: 59 | Admitting: Ophthalmology

## 2016-05-27 DIAGNOSIS — H35033 Hypertensive retinopathy, bilateral: Secondary | ICD-10-CM

## 2016-05-27 DIAGNOSIS — H348312 Tributary (branch) retinal vein occlusion, right eye, stable: Secondary | ICD-10-CM

## 2016-05-27 DIAGNOSIS — H338 Other retinal detachments: Secondary | ICD-10-CM | POA: Diagnosis not present

## 2016-05-27 DIAGNOSIS — H43813 Vitreous degeneration, bilateral: Secondary | ICD-10-CM

## 2016-05-27 DIAGNOSIS — I10 Essential (primary) hypertension: Secondary | ICD-10-CM | POA: Diagnosis not present

## 2016-05-27 DIAGNOSIS — H34812 Central retinal vein occlusion, left eye, with macular edema: Secondary | ICD-10-CM | POA: Diagnosis not present

## 2016-05-28 ENCOUNTER — Encounter
Admission: RE | Admit: 2016-05-28 | Discharge: 2016-05-28 | Disposition: A | Discharge status: Home / Self Care | Payer: 59 | Source: Ambulatory Visit | Attending: Surgery | Admitting: Surgery

## 2016-05-28 DIAGNOSIS — I10 Essential (primary) hypertension: Secondary | ICD-10-CM | POA: Diagnosis not present

## 2016-05-28 DIAGNOSIS — Z01812 Encounter for preprocedural laboratory examination: Secondary | ICD-10-CM | POA: Insufficient documentation

## 2016-05-28 DIAGNOSIS — Z01810 Encounter for preprocedural cardiovascular examination: Secondary | ICD-10-CM | POA: Diagnosis not present

## 2016-05-28 DIAGNOSIS — I2510 Atherosclerotic heart disease of native coronary artery without angina pectoris: Secondary | ICD-10-CM | POA: Diagnosis not present

## 2016-05-28 HISTORY — DX: Unspecified asthma, uncomplicated: J45.909

## 2016-05-28 HISTORY — DX: Dyspnea, unspecified: R06.00

## 2016-05-28 LAB — COMPREHENSIVE METABOLIC PANEL
ALT: 28 U/L (ref 14–54)
AST: 32 U/L (ref 15–41)
Albumin: 3.8 g/dL (ref 3.5–5.0)
Alkaline Phosphatase: 123 U/L (ref 38–126)
Anion gap: 7 (ref 5–15)
BILIRUBIN TOTAL: 0.4 mg/dL (ref 0.3–1.2)
BUN: 14 mg/dL (ref 6–20)
CALCIUM: 9.6 mg/dL (ref 8.9–10.3)
CHLORIDE: 106 mmol/L (ref 101–111)
CO2: 29 mmol/L (ref 22–32)
CREATININE: 0.89 mg/dL (ref 0.44–1.00)
GFR calc Af Amer: >60 mL/min (ref >60)
Glucose, Bld: 100 mg/dL — ABNORMAL HIGH (ref 65–99)
Potassium: 3.8 mmol/L (ref 3.5–5.1)
Sodium: 142 mmol/L (ref 135–145)
TOTAL PROTEIN: 7.8 g/dL (ref 6.5–8.1)

## 2016-05-28 LAB — DIFFERENTIAL
BASOS PCT: 1 %
Basophils Absolute: 0.1 10*3/uL (ref 0–0.1)
Eosinophils Absolute: 0.1 10*3/uL (ref 0–0.7)
Eosinophils Relative: 2 %
LYMPHS ABS: 2.1 10*3/uL (ref 1.0–3.6)
Lymphocytes Relative: 26 %
MONOS PCT: 7 %
Monocytes Absolute: 0.6 10*3/uL (ref 0.2–0.9)
NEUTROS ABS: 5.3 10*3/uL (ref 1.4–6.5)
Neutrophils Relative %: 64 %

## 2016-05-28 LAB — CBC
HEMATOCRIT: 40.3 % (ref 35.0–47.0)
Hemoglobin: 13.1 g/dL (ref 12.0–16.0)
MCH: 25 pg — ABNORMAL LOW (ref 26.0–34.0)
MCHC: 32.5 g/dL (ref 32.0–36.0)
MCV: 76.9 fL — AB (ref 80.0–100.0)
Platelets: 264 10*3/uL (ref 150–440)
RBC: 5.24 MIL/uL — ABNORMAL HIGH (ref 3.80–5.20)
RDW: 16.6 % — AB (ref 11.5–14.5)
WBC: 8.2 10*3/uL (ref 3.6–11.0)

## 2016-05-28 NOTE — Patient Instructions (Signed)
Your procedure is scheduled on: Friday 06/04/16 Report to [place]. 2ND FLOOR MEDICAL MALL ENTRANCE. To find out your arrival time please call [phone] between 1PM - 3PM on Thursday 06/03/16.  Remember: Instructions that are not followed completely may result in serious medical risk, up to and including death, or upon the discretion of your surgeon and anesthesiologist your surgery may need to be rescheduled.    __X__ 1. Do not eat food or drink liquids after midnight. No gum chewing or hard candies.     __X__ 2. No Alcohol for 24 hours before or after surgery.   ____ 3. Bring all medications with you on the day of surgery if instructed.    __X__ 4. Notify your doctor if there is any change in your medical condition     (cold, fever, infections).             ___X__5. No smoking within 24 hours of your surgery.     Do not wear jewelry, make-up, hairpins, clips or nail polish.  Do not wear lotions, powders, or perfumes.   Do not shave 48 hours prior to surgery. Men may shave face and neck.  Do not bring valuables to the hospital.    [facility] is not responsible for any belongings or valuables.               Contacts, dentures or bridgework may not be worn into surgery.  Leave your suitcase in the car. After surgery it may be brought to your room.  For patients admitted to the hospital, discharge time is determined by your                treatment team.   Patients discharged the day of surgery will not be allowed to drive home.   Please read over the following fact sheets that you were given:   Pain Booklet and MRSA Information   __X__ Take these medicines the morning of surgery with A SIP OF WATER:    1. DILTIAZEM  2. ISOSORBIDE  3. LEVOTHYROXINE  4. METOPROLOL  5. ACIPHEX  6.  ____ Fleet Enema (as directed)   __X__ Use CHG Soap as directed  __X__ Use inhalers on the day of surgery  ____ Stop metformin 2 days prior to surgery    ____ Take 1/2 of usual insulin  dose the night before surgery and none on the morning of surgery.   __X__ Stop ELOQUIS 2 DAYS PRIOR AND PLAVIX 5 DAYS PRIOR AS DIRECTED BY YOUR PHYSICIAN  __X__ Stop Anti-inflammatories such as Advil, Aleve, Ibuprofen, Motrin, Naproxen, Naprosyn, Goodies,powder, or aspirin products.  OK to take Tylenol.   ____ Stop supplements until after surgery.    __X__ Bring C-Pap to the hospital.

## 2016-06-04 ENCOUNTER — Ambulatory Visit
Admission: RE | Admit: 2016-06-04 | Discharge: 2016-06-04 | Disposition: A | Discharge status: Home / Self Care | Payer: Commercial Managed Care - HMO | Source: Ambulatory Visit | Attending: Surgery | Admitting: Surgery

## 2016-06-04 ENCOUNTER — Ambulatory Visit
Admission: RE | Admit: 2016-06-04 | Discharge: 2016-06-04 | Disposition: A | Discharge status: Home / Self Care | Payer: Commercial Managed Care - HMO | Source: Ambulatory Visit

## 2016-06-04 ENCOUNTER — Ambulatory Visit: Payer: Commercial Managed Care - HMO | Admitting: Anesthesiology

## 2016-06-04 ENCOUNTER — Other Ambulatory Visit: Payer: Self-pay | Admitting: Surgery

## 2016-06-04 ENCOUNTER — Encounter
Admission: RE | Disposition: A | Discharge status: Home / Self Care | Payer: Self-pay | Source: Ambulatory Visit | Attending: Surgery

## 2016-06-04 ENCOUNTER — Encounter: Payer: Self-pay | Admitting: Anesthesiology

## 2016-06-04 ENCOUNTER — Encounter
Admission: RE | Admit: 2016-06-04 | Discharge: 2016-06-04 | Disposition: A | Discharge status: Home / Self Care | Payer: Commercial Managed Care - HMO | Source: Ambulatory Visit | Attending: Surgery | Admitting: Surgery

## 2016-06-04 ENCOUNTER — Encounter: Payer: Self-pay | Admitting: *Deleted

## 2016-06-04 DIAGNOSIS — D649 Anemia, unspecified: Secondary | ICD-10-CM | POA: Insufficient documentation

## 2016-06-04 DIAGNOSIS — E785 Hyperlipidemia, unspecified: Secondary | ICD-10-CM | POA: Diagnosis not present

## 2016-06-04 DIAGNOSIS — Z79899 Other long term (current) drug therapy: Secondary | ICD-10-CM | POA: Insufficient documentation

## 2016-06-04 DIAGNOSIS — Z90710 Acquired absence of both cervix and uterus: Secondary | ICD-10-CM | POA: Diagnosis not present

## 2016-06-04 DIAGNOSIS — Z170 Estrogen receptor positive status [ER+]: Principal | ICD-10-CM

## 2016-06-04 DIAGNOSIS — C50412 Malignant neoplasm of upper-outer quadrant of left female breast: Secondary | ICD-10-CM

## 2016-06-04 DIAGNOSIS — G4730 Sleep apnea, unspecified: Secondary | ICD-10-CM | POA: Insufficient documentation

## 2016-06-04 DIAGNOSIS — G4719 Other hypersomnia: Secondary | ICD-10-CM | POA: Diagnosis not present

## 2016-06-04 DIAGNOSIS — Z803 Family history of malignant neoplasm of breast: Secondary | ICD-10-CM | POA: Insufficient documentation

## 2016-06-04 DIAGNOSIS — Z8572 Personal history of non-Hodgkin lymphomas: Secondary | ICD-10-CM | POA: Insufficient documentation

## 2016-06-04 DIAGNOSIS — C50812 Malignant neoplasm of overlapping sites of left female breast: Secondary | ICD-10-CM | POA: Diagnosis not present

## 2016-06-04 DIAGNOSIS — I2510 Atherosclerotic heart disease of native coronary artery without angina pectoris: Secondary | ICD-10-CM | POA: Diagnosis not present

## 2016-06-04 DIAGNOSIS — I10 Essential (primary) hypertension: Secondary | ICD-10-CM | POA: Diagnosis not present

## 2016-06-04 DIAGNOSIS — Z9049 Acquired absence of other specified parts of digestive tract: Secondary | ICD-10-CM | POA: Diagnosis not present

## 2016-06-04 DIAGNOSIS — R928 Other abnormal and inconclusive findings on diagnostic imaging of breast: Secondary | ICD-10-CM | POA: Diagnosis not present

## 2016-06-04 DIAGNOSIS — E079 Disorder of thyroid, unspecified: Secondary | ICD-10-CM | POA: Insufficient documentation

## 2016-06-04 DIAGNOSIS — C50912 Malignant neoplasm of unspecified site of left female breast: Secondary | ICD-10-CM | POA: Diagnosis not present

## 2016-06-04 DIAGNOSIS — I351 Nonrheumatic aortic (valve) insufficiency: Secondary | ICD-10-CM | POA: Insufficient documentation

## 2016-06-04 DIAGNOSIS — Z955 Presence of coronary angioplasty implant and graft: Secondary | ICD-10-CM | POA: Diagnosis not present

## 2016-06-04 DIAGNOSIS — Z885 Allergy status to narcotic agent status: Secondary | ICD-10-CM | POA: Diagnosis not present

## 2016-06-04 DIAGNOSIS — D0512 Intraductal carcinoma in situ of left breast: Secondary | ICD-10-CM | POA: Insufficient documentation

## 2016-06-04 DIAGNOSIS — Z8249 Family history of ischemic heart disease and other diseases of the circulatory system: Secondary | ICD-10-CM | POA: Diagnosis not present

## 2016-06-04 DIAGNOSIS — K219 Gastro-esophageal reflux disease without esophagitis: Secondary | ICD-10-CM | POA: Insufficient documentation

## 2016-06-04 HISTORY — PX: BREAST EXCISIONAL BIOPSY: SUR124

## 2016-06-04 HISTORY — PX: PARTIAL MASTECTOMY WITH NEEDLE LOCALIZATION: SHX6008

## 2016-06-04 HISTORY — PX: SENTINEL NODE BIOPSY: SHX6608

## 2016-06-04 HISTORY — PX: BREAST LUMPECTOMY: SHX2

## 2016-06-04 SURGERY — PARTIAL MASTECTOMY WITH NEEDLE LOCALIZATION
Anesthesia: General | Laterality: Left

## 2016-06-04 MED ORDER — TECHNETIUM TC 99M SULFUR COLLOID FILTERED
0.7930 | Freq: Once | INTRAVENOUS | Status: AC | PRN
  Administered 2016-06-04: 0.793 via INTRADERMAL

## 2016-06-04 MED ORDER — LACTATED RINGERS IV SOLN
INTRAVENOUS | Status: DC
  Administered 2016-06-04: 12:00:00 via INTRAVENOUS

## 2016-06-04 MED ORDER — BUPIVACAINE-EPINEPHRINE 0.5% -1:200000 IJ SOLN
INTRAMUSCULAR | Status: DC | PRN
  Administered 2016-06-04: 18 mL

## 2016-06-04 MED ORDER — BUPIVACAINE-EPINEPHRINE (PF) 0.5% -1:200000 IJ SOLN
INTRAMUSCULAR | Status: AC
  Filled 2016-06-04: qty 30

## 2016-06-04 MED ORDER — ROCURONIUM BROMIDE 50 MG/5ML IV SOLN
INTRAVENOUS | Status: AC
  Filled 2016-06-04: qty 1

## 2016-06-04 MED ORDER — HYDROCODONE-ACETAMINOPHEN 5-325 MG PO TABS
1.0000 | ORAL_TABLET | ORAL | Status: DC | PRN
  Administered 2016-06-04: 1 via ORAL

## 2016-06-04 MED ORDER — PROPOFOL 10 MG/ML IV BOLUS
INTRAVENOUS | Status: DC | PRN
  Administered 2016-06-04: 50 mg via INTRAVENOUS
  Administered 2016-06-04: 150 mg via INTRAVENOUS

## 2016-06-04 MED ORDER — FENTANYL CITRATE (PF) 100 MCG/2ML IJ SOLN
INTRAMUSCULAR | Status: AC
  Filled 2016-06-04: qty 2

## 2016-06-04 MED ORDER — GLYCOPYRROLATE 0.2 MG/ML IJ SOLN
INTRAMUSCULAR | Status: AC
  Filled 2016-06-04: qty 1

## 2016-06-04 MED ORDER — ONDANSETRON HCL 4 MG/2ML IJ SOLN
INTRAMUSCULAR | Status: AC
  Filled 2016-06-04: qty 2

## 2016-06-04 MED ORDER — ONDANSETRON HCL 4 MG/2ML IJ SOLN
INTRAMUSCULAR | Status: DC | PRN
  Administered 2016-06-04: 4 mg via INTRAVENOUS

## 2016-06-04 MED ORDER — DEXAMETHASONE SODIUM PHOSPHATE 10 MG/ML IJ SOLN
INTRAMUSCULAR | Status: DC | PRN
  Administered 2016-06-04: 5 mg via INTRAVENOUS

## 2016-06-04 MED ORDER — SUCCINYLCHOLINE CHLORIDE 20 MG/ML IJ SOLN
INTRAMUSCULAR | Status: AC
  Filled 2016-06-04: qty 1

## 2016-06-04 MED ORDER — EPHEDRINE SULFATE 50 MG/ML IJ SOLN
INTRAMUSCULAR | Status: DC | PRN
  Administered 2016-06-04: 10 mg via INTRAVENOUS

## 2016-06-04 MED ORDER — SEVOFLURANE IN SOLN
RESPIRATORY_TRACT | Status: AC
  Filled 2016-06-04: qty 250

## 2016-06-04 MED ORDER — ONDANSETRON HCL 4 MG/2ML IJ SOLN: 4.0000 mg | Freq: Once | INTRAMUSCULAR | Status: DC | PRN

## 2016-06-04 MED ORDER — HYDROCODONE-ACETAMINOPHEN 5-325 MG PO TABS
ORAL_TABLET | ORAL | Status: AC
  Filled 2016-06-04: qty 1

## 2016-06-04 MED ORDER — HYDROCODONE-ACETAMINOPHEN 5-325 MG PO TABS
1.0000 | ORAL_TABLET | ORAL | 0 refills | Status: DC | PRN
Start: 2016-06-04 — End: 2016-06-17

## 2016-06-04 MED ORDER — DEXAMETHASONE SODIUM PHOSPHATE 10 MG/ML IJ SOLN
INTRAMUSCULAR | Status: AC
  Filled 2016-06-04: qty 1

## 2016-06-04 MED ORDER — GLYCOPYRROLATE 0.2 MG/ML IJ SOLN
INTRAMUSCULAR | Status: DC | PRN
  Administered 2016-06-04: 0.2 mg via INTRAVENOUS

## 2016-06-04 MED ORDER — FENTANYL CITRATE (PF) 100 MCG/2ML IJ SOLN
INTRAMUSCULAR | Status: DC | PRN
  Administered 2016-06-04: 50 ug via INTRAVENOUS
  Administered 2016-06-04 (×4): 25 ug via INTRAVENOUS
  Administered 2016-06-04: 50 ug via INTRAVENOUS

## 2016-06-04 MED ORDER — FENTANYL CITRATE (PF) 100 MCG/2ML IJ SOLN: 25.0000 ug | INTRAMUSCULAR | Status: DC | PRN

## 2016-06-04 MED ORDER — LIDOCAINE HCL (PF) 2 % IJ SOLN
INTRAMUSCULAR | Status: DC | PRN
  Administered 2016-06-04: 50 mg

## 2016-06-04 SURGICAL SUPPLY — 36 items
BLADE SURG 15 STRL LF DISP TIS (BLADE) ×1 IMPLANT
BLADE SURG 15 STRL SS (BLADE) ×2
CANISTER SUCT 1200ML W/VALVE (MISCELLANEOUS) ×3 IMPLANT
CHLORAPREP W/TINT 26ML (MISCELLANEOUS) ×3 IMPLANT
CNTNR SPEC 2.5X3XGRAD LEK (MISCELLANEOUS) ×1
CONT SPEC 4OZ STER OR WHT (MISCELLANEOUS) ×2
CONTAINER SPEC 2.5X3XGRAD LEK (MISCELLANEOUS) ×1 IMPLANT
DERMABOND ADVANCED (GAUZE/BANDAGES/DRESSINGS) ×2
DERMABOND ADVANCED .7 DNX12 (GAUZE/BANDAGES/DRESSINGS) ×1 IMPLANT
DEVICE DUBIN SPECIMEN MAMMOGRA (MISCELLANEOUS) ×3 IMPLANT
DRAPE LAPAROTOMY 77X122 PED (DRAPES) ×3 IMPLANT
ELECT REM PT RETURN 9FT ADLT (ELECTROSURGICAL) ×3
ELECTRODE REM PT RTRN 9FT ADLT (ELECTROSURGICAL) ×1 IMPLANT
GLOVE BIO SURGEON STRL SZ7.5 (GLOVE) ×15 IMPLANT
GLOVE BIOGEL PI IND STRL 6.5 (GLOVE) ×1 IMPLANT
GLOVE BIOGEL PI IND STRL 7.0 (GLOVE) ×2 IMPLANT
GLOVE BIOGEL PI INDICATOR 6.5 (GLOVE) ×2
GLOVE BIOGEL PI INDICATOR 7.0 (GLOVE) ×4
GOWN STRL REUS W/ TWL LRG LVL3 (GOWN DISPOSABLE) ×2 IMPLANT
GOWN STRL REUS W/TWL LRG LVL3 (GOWN DISPOSABLE) ×4
KIT RM TURNOVER STRD PROC AR (KITS) ×3 IMPLANT
LABEL OR SOLS (LABEL) ×3 IMPLANT
MARGIN MAP 10MM (MISCELLANEOUS) ×3 IMPLANT
NDL SAFETY 18GX1.5 (NEEDLE) ×3 IMPLANT
NDL SAFETY 22GX1.5 (NEEDLE) ×3 IMPLANT
NEEDLE HYPO 25X1 1.5 SAFETY (NEEDLE) ×3 IMPLANT
PACK BASIN MINOR ARMC (MISCELLANEOUS) ×3 IMPLANT
SLEVE PROBE SENORX GAMMA FIND (MISCELLANEOUS) ×3 IMPLANT
SUT CHROMIC 4 0 RB 1X27 (SUTURE) ×3 IMPLANT
SUT ETHILON 3-0 FS-10 30 BLK (SUTURE) ×3
SUT MNCRL 4-0 (SUTURE) ×2
SUT MNCRL 4-0 27XMFL (SUTURE) ×1
SUTURE EHLN 3-0 FS-10 30 BLK (SUTURE) ×1 IMPLANT
SUTURE MNCRL 4-0 27XMF (SUTURE) ×1 IMPLANT
SYRINGE 10CC LL (SYRINGE) ×3 IMPLANT
WATER STERILE IRR 1000ML POUR (IV SOLUTION) ×3 IMPLANT

## 2016-06-04 NOTE — Op Note (Signed)
OPERATIVE REPORT  PREOPERATIVE  DIAGNOSIS: . Left breast cancer  POSTOPERATIVE DIAGNOSIS: . Left breast cancer  PROCEDURE: . Left partial mastectomy with axillary sentinel lymph node biopsy  ANESTHESIA:  General  SURGEON: Rochel Brome  MD   INDICATIONS: . She recently had mammogram findings of a density in the upper outer quadrant of the left breast. Ultrasound guided core needle biopsy demonstrated infiltrating mammary carcinoma. MRI demonstrated  another enhancing lesion close to the first lesion. Biopsy was highly suspicious for cancer. Surgery was recommended for definitive treatment. The patient had preoperative insertion of 2 Kopan's wires. These mammogram images were reviewed prior to surgery. She also had injection of radioactive technetium  sulfur colloid.  With the patient on the operating table in the supine position under general anesthesia the left arm was placed on a lateral arm support. The dressing was removed from the left breast exposing the Kopan's wires which entered the 3:00 position of the breast approximately 10 cm from the nipple. The Kopan's wires were cut 2 cm from the skin. The breast and axilla were prepared with ChloraPrep and draped in a sterile manner. A curvilinear incision was made from 2:00 to 4:00 position of the left breast 10 cm from the nipple and also removed an ellipse of skin in continuity with the underlying specimen. Dissection was carried down around the wires and around a palpable area of firmness representing the site of the cancer. Margin maps were used suturing markers to label the cranial caudal medial lateral margins. The skin remained in contact with the specimen. The specimen mammogram was done demonstrating the biopsy markers. The pathologist later called to report that margins were satisfactory.  Attention was turned to the axilla which was probed with a gamma counter demonstrating location of radioactivity in the inferior aspect of the axilla.  An oblique incision was made in the inferior aspect of the axilla and carried down through subcutaneous tissues. Several small, bleeding points were cauterized. Dissection was carried down deeply within the axilla adjacent to the rib cage where a focal area of radioactivity was demonstrated. A lymph node was dissected away from surrounding tissues. The ex vivo count was in the range of 1000 to 1200 counts per second. The background count was minimal. The lymph node was submitted fresh for routine pathology. There was no remaining palpable mass. The wound was inspected and could see hemostasis was intact. Subcutaneous tissues were infiltrated with half percent Sensorcaine with epinephrine.  The partial mastectomy wound was further inspected and several tiny bleeding points were cauterized. Tissues surrounding cautery artifact were infiltrated with half percent Sensorcaine with epinephrine. Also subcutaneous tissues were also infiltrated.  Subcutaneous tissues were approximated with interrupted 4-0 chromic. The skin was closed with running 4-0 Monocryl subcuticular suture. The axillary wound was further inspected and could see hemostasis was intact. The axillary wound was then closed with running 4-0 Monocryl subcuticular suture. Both wounds were treated with Dermabond and allowed to dry.  The patient tolerated surgery satisfactorily and was then prepared for transfer to the recovery room  [facility].D.

## 2016-06-04 NOTE — Anesthesia Post-op Follow-up Note (Cosign Needed)
Anesthesia QCDR form completed.        

## 2016-06-04 NOTE — Progress Notes (Signed)
Report to Nikki RN

## 2016-06-04 NOTE — Anesthesia Procedure Notes (Signed)
Procedure Name: LMA Insertion Performed by: Levorn Oleski Pre-anesthesia Checklist: Patient identified, Patient being monitored, Timeout performed, Emergency Drugs available and Suction available Patient Re-evaluated:Patient Re-evaluated prior to inductionOxygen Delivery Method: Circle system utilized Preoxygenation: Pre-oxygenation with 100% oxygen Intubation Type: IV induction Ventilation: Mask ventilation without difficulty LMA: LMA inserted LMA Size: 3.5 Tube type: Oral Number of attempts: 1 Placement Confirmation: positive ETCO2 and breath sounds checked- equal and bilateral Tube secured with: Tape Dental Injury: Teeth and Oropharynx as per pre-operative assessment        

## 2016-06-04 NOTE — Discharge Instructions (Signed)
Take Tylenol or Norco if needed for pain.  Should not drive or do anything dangerous when taking Norco.  Resume Plavix and Eliquis on Saturday.  May shower and blot dry.  Wear bra as desired for comfort and support.    AMBULATORY SURGERY  DISCHARGE INSTRUCTIONS   1) The drugs that you were given will stay in your system until tomorrow so for the next 24 hours you should not:  A) Drive an automobile B) Make any legal decisions C) Drink any alcoholic beverage   2) You may resume regular meals tomorrow.  Today it is better to start with liquids and gradually work up to solid foods.  You may eat anything you prefer, but it is better to start with liquids, then soup and crackers, and gradually work up to solid foods.   3) Please notify your doctor immediately if you have any unusual bleeding, trouble breathing, redness and pain at the surgery site, drainage, fever, or pain not relieved by medication.    4) Additional Instructions:        Please contact your physician with any problems or Same Day Surgery at [phone], Monday through Friday 6 am to 4 pm, or [place] at [facility] number at [phone].  Use CPAP machine when sleeping.

## 2016-06-04 NOTE — Anesthesia Preprocedure Evaluation (Addendum)
Anesthesia Evaluation  Patient identified by MRN, date of birth, ID band Patient awake    Reviewed: Allergy & Precautions, H&P , NPO status , Patient's Chart, lab work & pertinent test results, reviewed documented beta blocker date and time   History of Anesthesia Complications (+) PROLONGED EMERGENCE and history of anesthetic complications  Airway Mallampati: I  TM Distance: >3 FB Neck ROM: full    Dental  (+) Partial Upper, Partial Lower, Missing   Pulmonary shortness of breath and with exertion, asthma , sleep apnea , neg COPD, neg recent URI,           Cardiovascular Exercise Tolerance: Good hypertension, (-) angina+ CAD and + Cardiac Stents  (-) Past MI and (-) CABG + dysrhythmias Atrial Fibrillation (-) Valvular Problems/Murmurs     Neuro/Psych PSYCHIATRIC DISORDERS (Depression) negative neurological ROS     GI/Hepatic Neg liver ROS, GERD  Medicated,  Endo/Other  neg diabetesHypothyroidism   Renal/GU negative Renal ROS  negative genitourinary   Musculoskeletal   Abdominal   Peds  Hematology negative hematology ROS (+)   Anesthesia Other Findings Past Medical History: No date: Anginal pain (HCC) t: Arthritis     Comment: knee No date: Asthma     Comment: mild T: Coronary artery disease     Comment: 1 artery 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 Mamie Nick. 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 No date: No pertinent past medical history t: Sleep apnea     Comment: O2- 2l at bedtime and BiPap @ bedtime   Reproductive/Obstetrics negative OB ROS                            Anesthesia Physical Anesthesia Plan  ASA:  III  Anesthesia Plan: General   Post-op Pain Management:    Induction:   Airway Management Planned:   Additional Equipment:   Intra-op Plan:   Post-operative Plan:   Informed Consent: I have reviewed the patients History and Physical, chart, labs and discussed the procedure including the risks, benefits and alternatives for the proposed anesthesia with the patient or authorized representative who has indicated his/her understanding and acceptance.   Dental Advisory Given  Plan Discussed with: Anesthesiologist, CRNA and Surgeon  Anesthesia Plan Comments:         Anesthesia Quick Evaluation

## 2016-06-04 NOTE — H&P (Signed)
She had recent findings of infiltrating mammary carcinoma of the upper outer quadrant of the left breast. Another adjacent site was highly suspicious for cancer. She has had preoperative x-ray needle localization with insertion of 2 Kopan's wires. Subsequent mammogram images were reviewed. She also had injection of radioactive technetium sulfur colloid  She reports no change in condition since the office visit.  Lab work was.  I discussed the plan for left partial mastectomy with sentinel lymph node biopsy

## 2016-06-04 NOTE — Transfer of Care (Signed)
Immediate Anesthesia Transfer of Care Note  Patient: Stacey Stone  Procedure(s) Performed: Procedure(s): PARTIAL MASTECTOMY WITH NEEDLE LOCALIZATION (Left) SENTINEL NODE BIOPSY (Left)  Patient Location: PACU  Anesthesia Type:General  Level of Consciousness: sedated  Airway & Oxygen Therapy: Patient Spontanous Breathing and Patient connected to face mask oxygen  Post-op Assessment: Report given to RN and Post -op Vital signs reviewed and stable  Post vital signs: Reviewed  Last Vitals:  Vitals:   06/04/16 1200 06/04/16 1531  BP: (!) 160/82 109/67  Pulse: (!) 52 60  Resp: 18 14  Temp: 36.6 C 36.4 C    Last Pain:  Vitals:   06/04/16 1200  TempSrc: Oral         Complications: No apparent anesthesia complications

## 2016-06-04 NOTE — Anesthesia Postprocedure Evaluation (Signed)
Anesthesia Post Note  Patient: Janita A Mckneely  Procedure(s) Performed: Procedure(s) (LRB): PARTIAL MASTECTOMY WITH NEEDLE LOCALIZATION (Left) SENTINEL NODE BIOPSY (Left)  Patient location during evaluation: PACU Anesthesia Type: General Level of consciousness: awake and alert and oriented Pain management: pain level controlled Vital Signs Assessment: post-procedure vital signs reviewed and stable Respiratory status: spontaneous breathing, nonlabored ventilation and respiratory function stable Cardiovascular status: blood pressure returned to baseline and stable Postop Assessment: no signs of nausea or vomiting Anesthetic complications: no     Last Vitals:  Vitals:   06/04/16 1601 06/04/16 1616  BP: 125/87 132/87  Pulse: 69 66  Resp: (!) 22 19  Temp:      Last Pain:  Vitals:   06/04/16 1200  TempSrc: Oral                 Hawraa Stambaugh

## 2016-06-05 ENCOUNTER — Encounter: Payer: Self-pay | Admitting: Surgery

## 2016-06-07 NOTE — Progress Notes (Signed)
  Oncology Nurse Navigator Documentation  Navigator Location: [facility] (06/07/16 1500)   )Navigator Encounter Type: Telephone (06/07/16 1500) Telephone: Outgoing Call (06/07/16 1500)                   Patient Visit Type: Follow-up;Post-op Appt (06/07/16 1500) Treatment Phase: Active Tx (06/07/16 1500) Barriers/Navigation Needs: Coordination of Care (06/07/16 1500)   Interventions: Coordination of Care (06/07/16 1500)   Coordination of Care: Appts (06/07/16 1500)                  Time Spent with Patient: 30 (06/07/16 1500)  Patient doing well post-op with minimal discomfort. Has follow-up appointment with Dr. Tamala Julian on 06/17/16 at 2:30.  Scheduled appointment with Dr. Grayland Ormond on 06/17/16 at 3:15.

## 2016-06-07 NOTE — Progress Notes (Signed)
  Oncology Nurse Navigator Documentation  Navigator Location: [facility] (06/04/16 1100)   )        Surgery Date: 06/04/16 (06/04/16 1100)           Treatment Initiated Date: 06/04/16 (06/04/16 1100) Patient Visit Type: Surgery (06/04/16 1100) Treatment Phase: Active Tx (06/04/16 1100)   Education: Preparing for Upcoming Surgery/ Treatment (06/04/16 1100)                        Time Spent with Patient: 33 (06/04/16 1100)  Supported patient, daughter, and friend prior to surgery.

## 2016-06-08 LAB — SURGICAL PATHOLOGY

## 2016-06-14 ENCOUNTER — Ambulatory Visit: Payer: 59 | Admitting: Oncology

## 2016-06-15 NOTE — Progress Notes (Signed)
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  INTERVAL HISTORY:  Patient returns to clinic today for further evaluation and discussion of her pathology results. She tolerated her lumpectomy well. She currently feels well and remains asymptomatic. She denies any fevers, night sweats, or weight loss. She has no neurologic complaints. She has noted no new lymphadenopathy. She has no chest pain or shortness of breath. She denies any nausea, vomiting, constipation, or diarrhea. She has no urinary complaints. Patient offers no specific complaints today.  REVIEW OF SYSTEMS:   Review of Systems  Constitutional: Negative for chills, diaphoresis, fever, malaise/fatigue and weight loss.  Respiratory: Negative.  Negative for cough and shortness of breath.   Cardiovascular: Negative.  Negative for chest pain.  Gastrointestinal: Negative.  Negative for abdominal pain.  Genitourinary: Negative.   Musculoskeletal: Negative.   Neurological: Negative.  Negative for weakness.  Psychiatric/Behavioral: Negative.  The patient is not nervous/anxious.     As per HPI. Otherwise, a complete review of systems is negative.  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 Anginal pain ([place])   . Arthritis t   knee  . Asthma    mild  . 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 Mamie Nick. at [facility] in [place]  . Depression   . Dyspnea   . Dysrhythmia   . GERD (gastroesophageal reflux disease)   .  Hypertension   . Hypothyroidism   . MALT (mucosa associated lymphoid tissue) ([place]) 07/05/2014   Right neck mass resected 01/2014.  Marland Kitchen No pertinent past medical history   . Sleep apnea t   O2- 2l at bedtime and BiPap @ bedtime    PAST SURGICAL HISTORY: Past Surgical History:  Procedure Laterality Date  . BREAST BIOPSY Left 2/136/2018   path pending  . BREAST EXCISIONAL BIOPSY Left 06/04/2016   2 area NL prior to lumpectomy +  . CARDIAC CATHETERIZATION    . CARDIAC CATHETERIZATION N/A 09/24/2015   Procedure: Left Heart Cath and Coronary Angiography;  Surgeon: Isaias Cowman, MD;  Location: [facility];  Service: Cardiovascular;  Laterality: N/A;  . CARDIAC CATHETERIZATION N/A 09/24/2015   Procedure: Coronary Stent Intervention;  Surgeon: Isaias Cowman, MD;  Location: [facility];  Service: Cardiovascular;  Laterality: N/A;  . CHOLECYSTECTOMY  11/26   08/1970  . DILATATION & CURETTAGE/HYSTEROSCOPY WITH MYOSURE N/A 05/05/2015   Procedure: Hysteroscopy and endometrial curretage;  Surgeon: Boykin Nearing, MD;  Location: [facility];  Service: Gynecology;  Laterality: N/A;  . DILATION AND CURETTAGE OF UTERUS    . EXCISION MASS FROM NECK  Right    Dr. Tami Ribas  . EYE SURGERY  t   bil cataract 9/08  . HAMMER TOE SURGERY Right   . JOINT REPLACEMENT Bilateral 2008, 11/26/ 2012   Partial Knee Replacements  . PARTIAL MASTECTOMY WITH NEEDLE LOCALIZATION Left 06/04/2016   Procedure: PARTIAL MASTECTOMY WITH NEEDLE LOCALIZATION;  Surgeon: Leonie Green, MD;  Location: [facility];  Service: General;  Laterality: Left;  . SCLERAL BUCKLE  02/16/2011   Procedure: SCLERAL BUCKLE;  Surgeon: Hayden Pedro, MD;  Location: [facility];  Service: Ophthalmology;  Laterality: Left;  Scleral Buckle Left Eye with Headscope Laser  . SENTINEL NODE BIOPSY Left 06/04/2016   Procedure: SENTINEL NODE BIOPSY;  Surgeon: Leonie Green, MD;  Location: [facility];  Service: General;   Laterality: Left;    FAMILY HISTORY Family History  Problem Relation Age of Onset  . Breast cancer Mother 3  . Heart disease Father   . Breast cancer Paternal Aunt 73  . Breast cancer Cousin   . Anesthesia problems Neg Hx   . Hypotension Neg Hx   . Malignant hyperthermia Neg Hx   . Pseudochol deficiency Neg Hx     GYNECOLOGIC HISTORY:  No LMP recorded. Patient is postmenopausal.     ADVANCED DIRECTIVES:    HEALTH MAINTENANCE: Social History  Substance Use Topics  . Smoking status: Never Smoker  . Smokeless tobacco: Never Used  . Alcohol use No     Colonoscopy:  PAP:  Bone density:  Mammogram: November 2016  Allergies  Allergen Reactions  . Codeine Other (See Comments)    Stroke like symptoms    Current Outpatient Prescriptions  Medication Sig Dispense Refill  . apixaban (ELIQUIS) 5 MG TABS tablet Take 5 mg by mouth 2 (two) times daily.    Marland Kitchen Besifloxacin HCl (BESIVANCE) 0.6 % SUSP Place 1 drop into the left eye See admin instructions. AFTER EYE INJECTION    . brimonidine (ALPHAGAN) 0.2 % ophthalmic solution Place 1 drop into the left eye 2 (two) times daily.    . Cholecalciferol (VITAMIN D-1000 MAX ST) 1000 units tablet Take 1,000 Units by mouth daily.    . clopidogrel (PLAVIX) 75 MG tablet Take 1 tablet (75 mg total) by mouth daily with breakfast. 30 tablet 5  . diltiazem (CARDIZEM CD) 240 MG 24 hr capsule Take 240 mg by mouth daily.      . Fluticasone-Salmeterol (ADVAIR) 100-50 MCG/DOSE AEPB Inhale 1 puff into the lungs 2 (two) times daily as needed (for asthma.).    Marland Kitchen isosorbide mononitrate (IMDUR) 30 MG 24 hr tablet Take 30 mg by mouth daily.    Marland Kitchen levothyroxine (SYNTHROID, LEVOTHROID) 150 MCG tablet Take 150 mcg by mouth daily.      . Melatonin 5 MG TABS Take 5 mg by mouth at bedtime.    . metoprolol (TOPROL-XL) 100 MG 24 hr tablet Take 50 mg by mouth 2 (two) times daily.     . nitroGLYCERIN (NITROSTAT) 0.4 MG SL tablet Place 0.4 mg under the tongue every 5  (five) minutes as needed for chest pain. Maximum 3 doses, If no relief call md or 911.    Marland Kitchen oxybutynin (DITROPAN) 5 MG tablet Take 5 mg by mouth daily.    . potassium chloride (K-DUR) 10 MEQ tablet Take 1 tablet (10 mEq total) by mouth daily. 30 tablet 5  . RABEprazole (ACIPHEX) 20 MG tablet Take 20 mg by mouth daily.      . sertraline (ZOLOFT) 50 MG tablet Take 50 mg by mouth at bedtime.      . simvastatin (ZOCOR) 40 MG tablet Take 40 mg by mouth at bedtime.      . torsemide (DEMADEX) 20 MG tablet Take 20 mg by mouth daily as needed.      No current facility-administered medications for this visit.     OBJECTIVE: BP 137/87 (BP Location:  Right Arm, Patient Position: Sitting)   Pulse (!) 59   Wt 190 lb 8 oz (86.4 kg)   BMI 33.75 kg/m    Body mass index is 33.75 kg/m.    ECOG FS:0 - Asymptomatic  General: Well-developed, well-nourished, no acute distress. Eyes: Pink conjunctiva, anicteric sclera. Breasts: Well-healed surgical scar on left breast. HEENT: Normocephalic, moist mucous membranes, clear oropharnyx. No palpable lymphadenopathy. Lungs: Clear to auscultation bilaterally. Heart: Regular rate and rhythm. No rubs, murmurs, or gallops. Abdomen: Soft, nontender, nondistended. No organomegaly noted, normoactive bowel sounds. Musculoskeletal: No edema, cyanosis, or clubbing. Neuro: Alert, answering all questions appropriately. Cranial nerves grossly intact. Skin: No rashes or petechiae noted. Psych: Normal affect. Lymphatics: No cervical, clavicular, or axillary LAD.   LAB RESULTS:   STUDIES: No results found.  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  PLAN:    1. 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 Oncotype DX score has been ordered to determine whether adjuvant chemotherapy is necessary. This is pending at time of dictation. Patient will require adjuvant XRT and a referral was given to radiation oncology today. Finally, she will also benefit from an aromatase inhibitor for 5 years given the ER/PR positivity of her tumor. Return to clinic once Oncotype DX score is finalized for additional treatment planning.  2. MALT. Patient is status post excision of a right submandibular gland on February 05, 2014. Patient's most recent CT scans on November 14, 2015 reviewed independently and reported no obvious evidence of recurrent disease. No intervention is needed at this time. Patient is now over 2 years removed from her surgery and can be monitored with yearly imaging. Will repeat CT scan in August 2018.   Approximately 30 minutes was spent in discussion of which greater than 50% was consultation.  Patient expressed understanding and was in agreement with this plan. She also understands that She can call clinic at any time with any questions, concerns, or complaints.     Lloyd Huger, MD   06/22/2016 10:20 PM

## 2016-06-17 ENCOUNTER — Encounter: Payer: Self-pay | Admitting: Oncology

## 2016-06-17 ENCOUNTER — Inpatient Hospital Stay: Payer: 59 | Attending: Oncology | Admitting: Oncology

## 2016-06-17 VITALS — BP 137/87 | HR 59 | Wt 190.5 lb

## 2016-06-17 DIAGNOSIS — G4730 Sleep apnea, unspecified: Secondary | ICD-10-CM | POA: Insufficient documentation

## 2016-06-17 DIAGNOSIS — E039 Hypothyroidism, unspecified: Secondary | ICD-10-CM | POA: Diagnosis not present

## 2016-06-17 DIAGNOSIS — K219 Gastro-esophageal reflux disease without esophagitis: Secondary | ICD-10-CM | POA: Insufficient documentation

## 2016-06-17 DIAGNOSIS — N950 Postmenopausal bleeding: Secondary | ICD-10-CM | POA: Insufficient documentation

## 2016-06-17 DIAGNOSIS — I2510 Atherosclerotic heart disease of native coronary artery without angina pectoris: Secondary | ICD-10-CM

## 2016-06-17 DIAGNOSIS — Z780 Asymptomatic menopausal state: Secondary | ICD-10-CM

## 2016-06-17 DIAGNOSIS — Z9981 Dependence on supplemental oxygen: Secondary | ICD-10-CM | POA: Diagnosis not present

## 2016-06-17 DIAGNOSIS — Z7901 Long term (current) use of anticoagulants: Secondary | ICD-10-CM | POA: Insufficient documentation

## 2016-06-17 DIAGNOSIS — Z79899 Other long term (current) drug therapy: Secondary | ICD-10-CM | POA: Diagnosis not present

## 2016-06-17 DIAGNOSIS — C884 Extranodal marginal zone B-cell lymphoma of mucosa-associated lymphoid tissue [MALT-lymphoma]: Secondary | ICD-10-CM | POA: Diagnosis not present

## 2016-06-17 DIAGNOSIS — I10 Essential (primary) hypertension: Secondary | ICD-10-CM | POA: Insufficient documentation

## 2016-06-17 DIAGNOSIS — C50412 Malignant neoplasm of upper-outer quadrant of left female breast: Secondary | ICD-10-CM

## 2016-06-17 DIAGNOSIS — Z803 Family history of malignant neoplasm of breast: Secondary | ICD-10-CM

## 2016-06-24 NOTE — Progress Notes (Signed)
  Oncology Nurse Navigator Documentation  Navigator Location: [facility] (06/24/16 1100)   )Navigator Encounter Type: Telephone (06/24/16 1100) Telephone: Stacey Stone Call;Appt Confirmation/Clarification (06/24/16 1100)                           Interventions: Coordination of Care (06/24/16 1100)   Coordination of Care: Appts (06/24/16 1100)                  Time Spent with Patient: 15 (06/24/16 1100)  Patient phoned with question about date/time for consult appointment with Dr. Baruch Gouty. Explained that Mammoprint results are not back, but appointment will be scheduled at that time.

## 2016-06-25 ENCOUNTER — Encounter: Payer: Self-pay | Admitting: Oncology

## 2016-07-05 ENCOUNTER — Encounter: Payer: Self-pay | Admitting: Radiation Oncology

## 2016-07-05 ENCOUNTER — Ambulatory Visit
Admission: RE | Admit: 2016-07-05 | Discharge: 2016-07-05 | Disposition: A | Discharge status: Home / Self Care | Payer: 59 | Source: Ambulatory Visit | Attending: Radiation Oncology | Admitting: Radiation Oncology

## 2016-07-05 VITALS — BP 142/83 | HR 61 | Temp 97.4°F | Resp 18 | Wt 192.7 lb

## 2016-07-05 DIAGNOSIS — E039 Hypothyroidism, unspecified: Secondary | ICD-10-CM | POA: Insufficient documentation

## 2016-07-05 DIAGNOSIS — Z170 Estrogen receptor positive status [ER+]: Secondary | ICD-10-CM | POA: Diagnosis not present

## 2016-07-05 DIAGNOSIS — F329 Major depressive disorder, single episode, unspecified: Secondary | ICD-10-CM | POA: Diagnosis not present

## 2016-07-05 DIAGNOSIS — Z9049 Acquired absence of other specified parts of digestive tract: Secondary | ICD-10-CM | POA: Insufficient documentation

## 2016-07-05 DIAGNOSIS — R0602 Shortness of breath: Secondary | ICD-10-CM | POA: Insufficient documentation

## 2016-07-05 DIAGNOSIS — I10 Essential (primary) hypertension: Secondary | ICD-10-CM | POA: Diagnosis not present

## 2016-07-05 DIAGNOSIS — Z803 Family history of malignant neoplasm of breast: Secondary | ICD-10-CM | POA: Insufficient documentation

## 2016-07-05 DIAGNOSIS — Z510 Encounter for antineoplastic radiation therapy: Secondary | ICD-10-CM | POA: Insufficient documentation

## 2016-07-05 DIAGNOSIS — G4730 Sleep apnea, unspecified: Secondary | ICD-10-CM | POA: Diagnosis not present

## 2016-07-05 DIAGNOSIS — R079 Chest pain, unspecified: Secondary | ICD-10-CM | POA: Diagnosis not present

## 2016-07-05 DIAGNOSIS — M129 Arthropathy, unspecified: Secondary | ICD-10-CM | POA: Diagnosis not present

## 2016-07-05 DIAGNOSIS — Z79899 Other long term (current) drug therapy: Secondary | ICD-10-CM | POA: Insufficient documentation

## 2016-07-05 DIAGNOSIS — K219 Gastro-esophageal reflux disease without esophagitis: Secondary | ICD-10-CM | POA: Diagnosis not present

## 2016-07-05 DIAGNOSIS — I2510 Atherosclerotic heart disease of native coronary artery without angina pectoris: Secondary | ICD-10-CM | POA: Diagnosis not present

## 2016-07-05 DIAGNOSIS — Z9012 Acquired absence of left breast and nipple: Secondary | ICD-10-CM | POA: Insufficient documentation

## 2016-07-05 DIAGNOSIS — J45909 Unspecified asthma, uncomplicated: Secondary | ICD-10-CM | POA: Insufficient documentation

## 2016-07-05 DIAGNOSIS — C50412 Malignant neoplasm of upper-outer quadrant of left female breast: Secondary | ICD-10-CM | POA: Diagnosis not present

## 2016-07-05 NOTE — Consult Note (Signed)
NEW PATIENT EVALUATION  Name: Stacey Stone  MRN: [id number]  Date:   07/05/2016     DOB: November 07, 1945   This 71 y.o. female patient presents to the clinic for initial evaluation of stage I a invasive mammary carcinoma ER positive PR and HER-2 negative of the upper outer quadrant left breast status post wide local excision and sentinel node biopsy.  REFERRING PHYSICIAN: Rusty Aus, MD  CHIEF COMPLAINT:  Chief Complaint  Patient presents with  . Breast Cancer    Pt is here for initial consultation of breast cancer.      DIAGNOSIS: The encounter diagnosis was Malignant neoplasm of upper-outer quadrant of left breast in female, estrogen receptor positive ([place]).   PREVIOUS INVESTIGATIONS:  Pathology reports reviewed Mammogram ultrasound reviewed Clinical notes reviewed  HPI: Patient is a 72 year old female who presented with an abnormal mammogram showing a 1 cm lesion left breast in the upper outer quadrant and underwent MRI guided biopsy which was positive for invasive mammary carcinoma ER positive PR and HER-2/neu negative. She then underwent a wide local excision and sentinel node biopsy positive again for invasive mammary carcinoma measuring 1 cm in greatest dimension with margins clear at 6 mm. Tumor was overall grade 2. She had MammaPrint performed which by all report shows low risk recurrence will not be referred for systemic chemotherapy. She is seen today for radiation oncology opinion. She is doing well she is healed nicely she specifically denies breast tenderness cough or bone pain. At the time of wide local excision one sentinel lymph node was negative for malignancy. Patient also has a history of right parotid M ALT tumor which is been successfully excised.  PLANNED TREATMENT REGIMEN: Left whole breast radiation  PAST MEDICAL HISTORY:  has a past medical history of Anginal pain ([place]); Arthritis (t); Asthma; Coronary artery disease (T); Depression; Dyspnea;  Dysrhythmia; GERD (gastroesophageal reflux disease); Hypertension; Hypothyroidism; MALT (mucosa associated lymphoid tissue) ([place]) (07/05/2014); No pertinent past medical history; and Sleep apnea (t).    PAST SURGICAL HISTORY:  Past Surgical History:  Procedure Laterality Date  . BREAST BIOPSY Left 2/136/2018   path pending  . BREAST EXCISIONAL BIOPSY Left 06/04/2016   2 area NL prior to lumpectomy +  . CARDIAC CATHETERIZATION    . CARDIAC CATHETERIZATION N/A 09/24/2015   Procedure: Left Heart Cath and Coronary Angiography;  Surgeon: Isaias Cowman, MD;  Location: [facility] [facility];  Service: Cardiovascular;  Laterality: N/A;  . CARDIAC CATHETERIZATION N/A 09/24/2015   Procedure: Coronary Stent Intervention;  Surgeon: Isaias Cowman, MD;  Location: [facility];  Service: Cardiovascular;  Laterality: N/A;  . CHOLECYSTECTOMY  11/26   08/1970  . DILATATION & CURETTAGE/HYSTEROSCOPY WITH MYOSURE N/A 05/05/2015   Procedure: Hysteroscopy and endometrial curretage;  Surgeon: Boykin Nearing, MD;  Location: [facility];  Service: Gynecology;  Laterality: N/A;  . DILATION AND CURETTAGE OF UTERUS    . EXCISION MASS FROM NECK  Right    Dr. Tami Ribas  . EYE SURGERY  t   bil cataract 9/08  . HAMMER TOE SURGERY Right   . JOINT REPLACEMENT Bilateral 2008, 11/26/ 2012   Partial Knee Replacements  . PARTIAL MASTECTOMY WITH NEEDLE LOCALIZATION Left 06/04/2016   Procedure: PARTIAL MASTECTOMY WITH NEEDLE LOCALIZATION;  Surgeon: Leonie Green, MD;  Location: [facility];  Service: General;  Laterality: Left;  . SCLERAL BUCKLE  02/16/2011   Procedure: SCLERAL BUCKLE;  Surgeon: Hayden Pedro, MD;  Location: [facility];  Service: Ophthalmology;  Laterality: Left;  Scleral Buckle Left Eye with Headscope Laser  . SENTINEL NODE BIOPSY Left 06/04/2016   Procedure: SENTINEL NODE BIOPSY;  Surgeon: Leonie Green, MD;  Location: [facility];  Service: General;  Laterality: Left;    FAMILY HISTORY:  family history includes Breast cancer in her cousin; Breast cancer (age of onset: 65) in her mother; Breast cancer (age of onset: 92) in her paternal aunt; Heart disease in her father.  SOCIAL HISTORY:  reports that she has never smoked. She has never used smokeless tobacco. She reports that she does not drink alcohol or use drugs.  ALLERGIES: Codeine  MEDICATIONS:  Current Outpatient Prescriptions  Medication Sig Dispense Refill  . apixaban (ELIQUIS) 5 MG TABS tablet Take 5 mg by mouth 2 (two) times daily.    Marland Kitchen Besifloxacin HCl (BESIVANCE) 0.6 % SUSP Place 1 drop into the left eye See admin instructions. AFTER EYE INJECTION    . brimonidine (ALPHAGAN) 0.2 % ophthalmic solution Place 1 drop into the left eye 2 (two) times daily.    . Cholecalciferol (VITAMIN D-1000 MAX ST) 1000 units tablet Take 1,000 Units by mouth daily.    . clopidogrel (PLAVIX) 75 MG tablet Take 1 tablet (75 mg total) by mouth daily with breakfast. 30 tablet 5  . diltiazem (CARDIZEM CD) 240 MG 24 hr capsule Take 240 mg by mouth daily.      . Fluticasone-Salmeterol (ADVAIR) 100-50 MCG/DOSE AEPB Inhale 1 puff into the lungs 2 (two) times daily as needed (for asthma.).    Marland Kitchen isosorbide mononitrate (IMDUR) 30 MG 24 hr tablet Take 30 mg by mouth daily.    Marland Kitchen levothyroxine (SYNTHROID, LEVOTHROID) 150 MCG tablet Take 150 mcg by mouth daily.      . Melatonin 5 MG TABS Take 5 mg by mouth at bedtime.    . metoprolol (TOPROL-XL) 100 MG 24 hr tablet Take 50 mg by mouth 2 (two) times daily.     . nitroGLYCERIN (NITROSTAT) 0.4 MG SL tablet Place 0.4 mg under the tongue every 5 (five) minutes as needed for chest pain. Maximum 3 doses, If no relief call md or 911.    Marland Kitchen oxybutynin (DITROPAN) 5 MG tablet Take 5 mg by mouth daily.    . potassium chloride (K-DUR) 10 MEQ tablet Take 1 tablet (10 mEq total) by mouth daily. 30 tablet 5  . RABEprazole (ACIPHEX) 20 MG tablet Take 20 mg by mouth daily.      . sertraline (ZOLOFT) 50 MG tablet Take 50  mg by mouth at bedtime.      . simvastatin (ZOCOR) 40 MG tablet Take 40 mg by mouth at bedtime.      . torsemide (DEMADEX) 20 MG tablet Take 20 mg by mouth daily as needed.      No current facility-administered medications for this encounter.     ECOG PERFORMANCE STATUS:  0 - Asymptomatic  REVIEW OF SYSTEMS:  Patient denies any weight loss, fatigue, weakness, fever, chills or night sweats. Patient denies any loss of vision, blurred vision. Patient denies any ringing  of the ears or hearing loss. No irregular heartbeat. Patient denies heart murmur or history of fainting. Patient denies any chest pain or pain radiating to her upper extremities. Patient denies any shortness of breath, difficulty breathing at night, cough or hemoptysis. Patient denies any swelling in the lower legs. Patient denies any nausea vomiting, vomiting of blood, or coffee ground material in the vomitus. Patient denies any stomach pain. Patient states has  had normal bowel movements no significant constipation or diarrhea. Patient denies any dysuria, hematuria or significant nocturia. Patient denies any problems walking, swelling in the joints or loss of balance. Patient denies any skin changes, loss of hair or loss of weight. Patient denies any excessive worrying or anxiety or significant depression. Patient denies any problems with insomnia. Patient denies excessive thirst, polyuria, polydipsia. Patient denies any swollen glands, patient denies easy bruising or easy bleeding. Patient denies any recent infections, allergies or URI. Patient "s visual fields have not changed significantly in recent time.    PHYSICAL EXAM: BP (!) 142/83   Pulse 61   Temp 97.4 F (36.3 C)   Resp 18   Wt 192 lb 10.9 oz (87.4 kg)   BMI 34.13 kg/m  Left breast is wide local excision scar which is healed well. No dominant mass or nodularity is noted in either breast in 2 positions examined. No axillary or supra clavicular adenopathy is  identified. Well-developed well-nourished patient in NAD. HEENT reveals PERLA, EOMI, discs not visualized.  Oral cavity is clear. No oral mucosal lesions are identified. Neck is clear without evidence of cervical or supraclavicular adenopathy. Lungs are clear to A&P. Cardiac examination is essentially unremarkable with regular rate and rhythm without murmur rub or thrill. Abdomen is benign with no organomegaly or masses noted. Motor sensory and DTR levels are equal and symmetric in the upper and lower extremities. Cranial nerves II through XII are grossly intact. Proprioception is intact. No peripheral adenopathy or edema is identified. No motor or sensory levels are noted. Crude visual fields are within normal range.  LABORATORY DATA: Pathology reports reviewed    RADIOLOGY RESULTS: Mammogram ultrasound and MRI scans reviewed   IMPRESSION: Stage I invasive mammary carcinoma of the left breast status post wide local excision and sentinel node biopsy with low risk of recurrence by MammaPrint in 71 year old female  PLAN: At this time I to go ahead with whole breast radiation. I believe her breast is of too large a volume to successfully go through hypofractionated course of treatment. Would plan on delivering 5040 cGy in 28 fractions to her left breast. Would also boost her scar another 1400 cGy using electrons. Risks and benefits of treatment including skin reaction fatigue alteration of blood counts possible inclusion of superficial lung all were described in detail to the patient. We'll do our best to avoid any heart toxicity. I first set up and ordered CT simulation for the patient this week. Patient also will be candidate for antiestrogen therapy after completion of radiation.  I would like to take this opportunity to thank you for allowing me to participate in the care of your patient.Armstead Peaks., MD

## 2016-07-06 ENCOUNTER — Ambulatory Visit
Admission: RE | Admit: 2016-07-06 | Discharge: 2016-07-06 | Disposition: A | Discharge status: Home / Self Care | Payer: 59 | Source: Ambulatory Visit | Attending: Radiation Oncology | Admitting: Radiation Oncology

## 2016-07-06 DIAGNOSIS — Z170 Estrogen receptor positive status [ER+]: Secondary | ICD-10-CM | POA: Diagnosis not present

## 2016-07-06 DIAGNOSIS — C50412 Malignant neoplasm of upper-outer quadrant of left female breast: Secondary | ICD-10-CM | POA: Diagnosis not present

## 2016-07-07 DIAGNOSIS — C50412 Malignant neoplasm of upper-outer quadrant of left female breast: Secondary | ICD-10-CM | POA: Diagnosis not present

## 2016-07-07 DIAGNOSIS — Z170 Estrogen receptor positive status [ER+]: Secondary | ICD-10-CM | POA: Diagnosis not present

## 2016-07-09 ENCOUNTER — Other Ambulatory Visit: Payer: Self-pay | Admitting: *Deleted

## 2016-07-09 DIAGNOSIS — C50412 Malignant neoplasm of upper-outer quadrant of left female breast: Secondary | ICD-10-CM

## 2016-07-09 DIAGNOSIS — H40052 Ocular hypertension, left eye: Secondary | ICD-10-CM | POA: Diagnosis not present

## 2016-07-14 DIAGNOSIS — Z0000 Encounter for general adult medical examination without abnormal findings: Secondary | ICD-10-CM | POA: Diagnosis not present

## 2016-07-15 ENCOUNTER — Encounter (INDEPENDENT_AMBULATORY_CARE_PROVIDER_SITE_OTHER): Payer: 59 | Admitting: Ophthalmology

## 2016-07-15 ENCOUNTER — Ambulatory Visit
Admission: RE | Admit: 2016-07-15 | Discharge: 2016-07-15 | Disposition: A | Discharge status: Home / Self Care | Payer: 59 | Source: Ambulatory Visit | Attending: Radiation Oncology | Admitting: Radiation Oncology

## 2016-07-15 DIAGNOSIS — Z170 Estrogen receptor positive status [ER+]: Secondary | ICD-10-CM | POA: Diagnosis not present

## 2016-07-15 DIAGNOSIS — C50412 Malignant neoplasm of upper-outer quadrant of left female breast: Secondary | ICD-10-CM | POA: Diagnosis not present

## 2016-07-19 ENCOUNTER — Ambulatory Visit
Admission: RE | Admit: 2016-07-19 | Discharge: 2016-07-19 | Disposition: A | Discharge status: Home / Self Care | Payer: 59 | Source: Ambulatory Visit | Attending: Radiation Oncology | Admitting: Radiation Oncology

## 2016-07-19 DIAGNOSIS — C50412 Malignant neoplasm of upper-outer quadrant of left female breast: Secondary | ICD-10-CM | POA: Diagnosis not present

## 2016-07-20 ENCOUNTER — Ambulatory Visit
Admission: RE | Admit: 2016-07-20 | Discharge: 2016-07-20 | Disposition: A | Discharge status: Home / Self Care | Payer: 59 | Source: Ambulatory Visit | Attending: Radiation Oncology | Admitting: Radiation Oncology

## 2016-07-20 DIAGNOSIS — C50412 Malignant neoplasm of upper-outer quadrant of left female breast: Secondary | ICD-10-CM | POA: Diagnosis not present

## 2016-07-21 ENCOUNTER — Ambulatory Visit
Admission: RE | Admit: 2016-07-21 | Discharge: 2016-07-21 | Disposition: A | Discharge status: Home / Self Care | Payer: 59 | Source: Ambulatory Visit | Attending: Radiation Oncology | Admitting: Radiation Oncology

## 2016-07-21 DIAGNOSIS — E782 Mixed hyperlipidemia: Secondary | ICD-10-CM | POA: Diagnosis not present

## 2016-07-21 DIAGNOSIS — C50412 Malignant neoplasm of upper-outer quadrant of left female breast: Secondary | ICD-10-CM | POA: Diagnosis not present

## 2016-07-21 DIAGNOSIS — E039 Hypothyroidism, unspecified: Secondary | ICD-10-CM | POA: Diagnosis not present

## 2016-07-21 DIAGNOSIS — E538 Deficiency of other specified B group vitamins: Secondary | ICD-10-CM | POA: Diagnosis not present

## 2016-07-22 ENCOUNTER — Ambulatory Visit
Admission: RE | Admit: 2016-07-22 | Discharge: 2016-07-22 | Disposition: A | Discharge status: Home / Self Care | Payer: 59 | Source: Ambulatory Visit | Attending: Radiation Oncology | Admitting: Radiation Oncology

## 2016-07-22 ENCOUNTER — Encounter (INDEPENDENT_AMBULATORY_CARE_PROVIDER_SITE_OTHER): Payer: 59 | Admitting: Ophthalmology

## 2016-07-22 DIAGNOSIS — H43813 Vitreous degeneration, bilateral: Secondary | ICD-10-CM

## 2016-07-22 DIAGNOSIS — H348312 Tributary (branch) retinal vein occlusion, right eye, stable: Secondary | ICD-10-CM | POA: Diagnosis not present

## 2016-07-22 DIAGNOSIS — H34812 Central retinal vein occlusion, left eye, with macular edema: Secondary | ICD-10-CM

## 2016-07-22 DIAGNOSIS — H338 Other retinal detachments: Secondary | ICD-10-CM

## 2016-07-22 DIAGNOSIS — C50412 Malignant neoplasm of upper-outer quadrant of left female breast: Secondary | ICD-10-CM | POA: Diagnosis not present

## 2016-07-22 DIAGNOSIS — I10 Essential (primary) hypertension: Secondary | ICD-10-CM | POA: Diagnosis not present

## 2016-07-22 DIAGNOSIS — H35033 Hypertensive retinopathy, bilateral: Secondary | ICD-10-CM

## 2016-07-23 ENCOUNTER — Ambulatory Visit
Admission: RE | Admit: 2016-07-23 | Discharge: 2016-07-23 | Disposition: A | Discharge status: Home / Self Care | Payer: 59 | Source: Ambulatory Visit | Attending: Radiation Oncology | Admitting: Radiation Oncology

## 2016-07-23 DIAGNOSIS — Z170 Estrogen receptor positive status [ER+]: Secondary | ICD-10-CM | POA: Diagnosis not present

## 2016-07-23 DIAGNOSIS — C50412 Malignant neoplasm of upper-outer quadrant of left female breast: Secondary | ICD-10-CM | POA: Diagnosis not present

## 2016-07-26 ENCOUNTER — Ambulatory Visit
Admission: RE | Admit: 2016-07-26 | Discharge: 2016-07-26 | Disposition: A | Discharge status: Home / Self Care | Payer: 59 | Source: Ambulatory Visit | Attending: Radiation Oncology | Admitting: Radiation Oncology

## 2016-07-26 DIAGNOSIS — C50412 Malignant neoplasm of upper-outer quadrant of left female breast: Secondary | ICD-10-CM | POA: Diagnosis not present

## 2016-07-27 ENCOUNTER — Ambulatory Visit
Admission: RE | Admit: 2016-07-27 | Discharge: 2016-07-27 | Disposition: A | Discharge status: Home / Self Care | Payer: 59 | Source: Ambulatory Visit | Attending: Radiation Oncology | Admitting: Radiation Oncology

## 2016-07-27 DIAGNOSIS — C50412 Malignant neoplasm of upper-outer quadrant of left female breast: Secondary | ICD-10-CM | POA: Diagnosis not present

## 2016-07-28 ENCOUNTER — Ambulatory Visit
Admission: RE | Admit: 2016-07-28 | Discharge: 2016-07-28 | Disposition: A | Discharge status: Home / Self Care | Payer: 59 | Source: Ambulatory Visit | Attending: Radiation Oncology | Admitting: Radiation Oncology

## 2016-07-28 DIAGNOSIS — C50412 Malignant neoplasm of upper-outer quadrant of left female breast: Secondary | ICD-10-CM | POA: Diagnosis not present

## 2016-07-29 ENCOUNTER — Ambulatory Visit
Admission: RE | Admit: 2016-07-29 | Discharge: 2016-07-29 | Disposition: A | Discharge status: Home / Self Care | Payer: 59 | Source: Ambulatory Visit | Attending: Radiation Oncology | Admitting: Radiation Oncology

## 2016-07-29 DIAGNOSIS — C50412 Malignant neoplasm of upper-outer quadrant of left female breast: Secondary | ICD-10-CM | POA: Diagnosis not present

## 2016-07-30 ENCOUNTER — Ambulatory Visit
Admission: RE | Admit: 2016-07-30 | Discharge: 2016-07-30 | Disposition: A | Discharge status: Home / Self Care | Payer: 59 | Source: Ambulatory Visit | Attending: Radiation Oncology | Admitting: Radiation Oncology

## 2016-07-30 DIAGNOSIS — Z170 Estrogen receptor positive status [ER+]: Secondary | ICD-10-CM | POA: Diagnosis not present

## 2016-07-30 DIAGNOSIS — C50412 Malignant neoplasm of upper-outer quadrant of left female breast: Secondary | ICD-10-CM | POA: Diagnosis not present

## 2016-08-02 ENCOUNTER — Ambulatory Visit
Admission: RE | Admit: 2016-08-02 | Discharge: 2016-08-02 | Disposition: A | Discharge status: Home / Self Care | Payer: 59 | Source: Ambulatory Visit | Attending: Radiation Oncology | Admitting: Radiation Oncology

## 2016-08-02 DIAGNOSIS — C50412 Malignant neoplasm of upper-outer quadrant of left female breast: Secondary | ICD-10-CM | POA: Diagnosis not present

## 2016-08-03 ENCOUNTER — Ambulatory Visit
Admission: RE | Admit: 2016-08-03 | Discharge: 2016-08-03 | Disposition: A | Discharge status: Home / Self Care | Payer: 59 | Source: Ambulatory Visit | Attending: Radiation Oncology | Admitting: Radiation Oncology

## 2016-08-03 ENCOUNTER — Other Ambulatory Visit: Payer: Self-pay

## 2016-08-03 ENCOUNTER — Other Ambulatory Visit: Payer: Self-pay | Admitting: *Deleted

## 2016-08-03 ENCOUNTER — Inpatient Hospital Stay: Payer: 59 | Attending: Oncology

## 2016-08-03 DIAGNOSIS — C50412 Malignant neoplasm of upper-outer quadrant of left female breast: Secondary | ICD-10-CM | POA: Insufficient documentation

## 2016-08-03 DIAGNOSIS — C884 Extranodal marginal zone B-cell lymphoma of mucosa-associated lymphoid tissue [MALT-lymphoma]: Secondary | ICD-10-CM | POA: Insufficient documentation

## 2016-08-03 LAB — CBC
HCT: 40 % (ref 35.0–47.0)
Hemoglobin: 13.2 g/dL (ref 12.0–16.0)
MCH: 25.4 pg — ABNORMAL LOW (ref 26.0–34.0)
MCHC: 32.9 g/dL (ref 32.0–36.0)
MCV: 77.2 fL — AB (ref 80.0–100.0)
PLATELETS: 256 10*3/uL (ref 150–440)
RBC: 5.18 MIL/uL (ref 3.80–5.20)
RDW: 17.1 % — AB (ref 11.5–14.5)
WBC: 7.4 10*3/uL (ref 3.6–11.0)

## 2016-08-04 ENCOUNTER — Ambulatory Visit
Admission: RE | Admit: 2016-08-04 | Discharge: 2016-08-04 | Disposition: A | Discharge status: Home / Self Care | Payer: 59 | Source: Ambulatory Visit | Attending: Radiation Oncology | Admitting: Radiation Oncology

## 2016-08-04 DIAGNOSIS — C50412 Malignant neoplasm of upper-outer quadrant of left female breast: Secondary | ICD-10-CM | POA: Diagnosis not present

## 2016-08-05 ENCOUNTER — Ambulatory Visit
Admission: RE | Admit: 2016-08-05 | Discharge: 2016-08-05 | Disposition: A | Discharge status: Home / Self Care | Payer: 59 | Source: Ambulatory Visit | Attending: Radiation Oncology | Admitting: Radiation Oncology

## 2016-08-05 DIAGNOSIS — C50412 Malignant neoplasm of upper-outer quadrant of left female breast: Secondary | ICD-10-CM | POA: Diagnosis not present

## 2016-08-06 ENCOUNTER — Ambulatory Visit
Admission: RE | Admit: 2016-08-06 | Discharge: 2016-08-06 | Disposition: A | Discharge status: Home / Self Care | Payer: 59 | Source: Ambulatory Visit | Attending: Radiation Oncology | Admitting: Radiation Oncology

## 2016-08-06 DIAGNOSIS — C50412 Malignant neoplasm of upper-outer quadrant of left female breast: Secondary | ICD-10-CM | POA: Diagnosis not present

## 2016-08-06 DIAGNOSIS — Z170 Estrogen receptor positive status [ER+]: Secondary | ICD-10-CM | POA: Diagnosis not present

## 2016-08-09 ENCOUNTER — Ambulatory Visit: Payer: 59

## 2016-08-10 ENCOUNTER — Ambulatory Visit
Admission: RE | Admit: 2016-08-10 | Discharge: 2016-08-10 | Disposition: A | Discharge status: Home / Self Care | Payer: 59 | Source: Ambulatory Visit | Attending: Radiation Oncology | Admitting: Radiation Oncology

## 2016-08-10 DIAGNOSIS — C50412 Malignant neoplasm of upper-outer quadrant of left female breast: Secondary | ICD-10-CM | POA: Diagnosis not present

## 2016-08-11 ENCOUNTER — Ambulatory Visit
Admission: RE | Admit: 2016-08-11 | Discharge: 2016-08-11 | Disposition: A | Discharge status: Home / Self Care | Payer: 59 | Source: Ambulatory Visit | Attending: Radiation Oncology | Admitting: Radiation Oncology

## 2016-08-11 DIAGNOSIS — C50412 Malignant neoplasm of upper-outer quadrant of left female breast: Secondary | ICD-10-CM | POA: Diagnosis not present

## 2016-08-12 ENCOUNTER — Ambulatory Visit
Admission: RE | Admit: 2016-08-12 | Discharge: 2016-08-12 | Disposition: A | Discharge status: Home / Self Care | Payer: 59 | Source: Ambulatory Visit | Attending: Radiation Oncology | Admitting: Radiation Oncology

## 2016-08-12 DIAGNOSIS — C50412 Malignant neoplasm of upper-outer quadrant of left female breast: Secondary | ICD-10-CM | POA: Diagnosis not present

## 2016-08-13 ENCOUNTER — Ambulatory Visit
Admission: RE | Admit: 2016-08-13 | Discharge: 2016-08-13 | Disposition: A | Discharge status: Home / Self Care | Payer: 59 | Source: Ambulatory Visit | Attending: Radiation Oncology | Admitting: Radiation Oncology

## 2016-08-13 DIAGNOSIS — C50412 Malignant neoplasm of upper-outer quadrant of left female breast: Secondary | ICD-10-CM | POA: Diagnosis not present

## 2016-08-17 ENCOUNTER — Inpatient Hospital Stay: Payer: 59

## 2016-08-17 ENCOUNTER — Ambulatory Visit
Admission: RE | Admit: 2016-08-17 | Discharge: 2016-08-17 | Disposition: A | Discharge status: Home / Self Care | Payer: 59 | Source: Ambulatory Visit | Attending: Radiation Oncology | Admitting: Radiation Oncology

## 2016-08-17 DIAGNOSIS — C50412 Malignant neoplasm of upper-outer quadrant of left female breast: Secondary | ICD-10-CM | POA: Diagnosis not present

## 2016-08-17 DIAGNOSIS — Z170 Estrogen receptor positive status [ER+]: Secondary | ICD-10-CM | POA: Diagnosis not present

## 2016-08-17 LAB — CBC
HEMATOCRIT: 37.8 % (ref 35.0–47.0)
HEMOGLOBIN: 12.6 g/dL (ref 12.0–16.0)
MCH: 25.7 pg — ABNORMAL LOW (ref 26.0–34.0)
MCHC: 33.4 g/dL (ref 32.0–36.0)
MCV: 76.9 fL — AB (ref 80.0–100.0)
Platelets: 209 10*3/uL (ref 150–440)
RBC: 4.92 MIL/uL (ref 3.80–5.20)
RDW: 16.8 % — AB (ref 11.5–14.5)
WBC: 6.3 10*3/uL (ref 3.6–11.0)

## 2016-08-18 ENCOUNTER — Ambulatory Visit
Admission: RE | Admit: 2016-08-18 | Discharge: 2016-08-18 | Disposition: A | Discharge status: Home / Self Care | Payer: 59 | Source: Ambulatory Visit | Attending: Radiation Oncology | Admitting: Radiation Oncology

## 2016-08-18 DIAGNOSIS — Z170 Estrogen receptor positive status [ER+]: Secondary | ICD-10-CM | POA: Diagnosis not present

## 2016-08-18 DIAGNOSIS — C50412 Malignant neoplasm of upper-outer quadrant of left female breast: Secondary | ICD-10-CM | POA: Diagnosis not present

## 2016-08-19 ENCOUNTER — Ambulatory Visit
Admission: RE | Admit: 2016-08-19 | Discharge: 2016-08-19 | Disposition: A | Discharge status: Home / Self Care | Payer: 59 | Source: Ambulatory Visit | Attending: Radiation Oncology | Admitting: Radiation Oncology

## 2016-08-19 DIAGNOSIS — C50412 Malignant neoplasm of upper-outer quadrant of left female breast: Secondary | ICD-10-CM | POA: Diagnosis not present

## 2016-08-19 DIAGNOSIS — Z170 Estrogen receptor positive status [ER+]: Secondary | ICD-10-CM | POA: Diagnosis not present

## 2016-08-20 ENCOUNTER — Ambulatory Visit
Admission: RE | Admit: 2016-08-20 | Discharge: 2016-08-20 | Disposition: A | Discharge status: Home / Self Care | Payer: 59 | Source: Ambulatory Visit | Attending: Radiation Oncology | Admitting: Radiation Oncology

## 2016-08-20 DIAGNOSIS — C50412 Malignant neoplasm of upper-outer quadrant of left female breast: Secondary | ICD-10-CM | POA: Diagnosis not present

## 2016-08-23 ENCOUNTER — Ambulatory Visit
Admission: RE | Admit: 2016-08-23 | Discharge: 2016-08-23 | Disposition: A | Discharge status: Home / Self Care | Payer: 59 | Source: Ambulatory Visit | Attending: Radiation Oncology | Admitting: Radiation Oncology

## 2016-08-23 DIAGNOSIS — C50412 Malignant neoplasm of upper-outer quadrant of left female breast: Secondary | ICD-10-CM | POA: Diagnosis not present

## 2016-08-24 ENCOUNTER — Ambulatory Visit
Admission: RE | Admit: 2016-08-24 | Discharge: 2016-08-24 | Disposition: A | Discharge status: Home / Self Care | Payer: 59 | Source: Ambulatory Visit | Attending: Radiation Oncology | Admitting: Radiation Oncology

## 2016-08-24 DIAGNOSIS — Z170 Estrogen receptor positive status [ER+]: Secondary | ICD-10-CM | POA: Diagnosis not present

## 2016-08-24 DIAGNOSIS — C50412 Malignant neoplasm of upper-outer quadrant of left female breast: Secondary | ICD-10-CM | POA: Diagnosis not present

## 2016-08-25 ENCOUNTER — Ambulatory Visit
Admission: RE | Admit: 2016-08-25 | Discharge: 2016-08-25 | Disposition: A | Discharge status: Home / Self Care | Payer: 59 | Source: Ambulatory Visit | Attending: Radiation Oncology | Admitting: Radiation Oncology

## 2016-08-25 DIAGNOSIS — C50412 Malignant neoplasm of upper-outer quadrant of left female breast: Secondary | ICD-10-CM | POA: Diagnosis not present

## 2016-08-26 ENCOUNTER — Ambulatory Visit
Admission: RE | Admit: 2016-08-26 | Discharge: 2016-08-26 | Disposition: A | Discharge status: Home / Self Care | Payer: 59 | Source: Ambulatory Visit | Attending: Radiation Oncology | Admitting: Radiation Oncology

## 2016-08-26 DIAGNOSIS — C50412 Malignant neoplasm of upper-outer quadrant of left female breast: Secondary | ICD-10-CM | POA: Diagnosis not present

## 2016-08-27 ENCOUNTER — Ambulatory Visit
Admission: RE | Admit: 2016-08-27 | Discharge: 2016-08-27 | Disposition: A | Discharge status: Home / Self Care | Payer: 59 | Source: Ambulatory Visit | Attending: Radiation Oncology | Admitting: Radiation Oncology

## 2016-08-27 ENCOUNTER — Ambulatory Visit: Payer: 59

## 2016-08-27 DIAGNOSIS — C50412 Malignant neoplasm of upper-outer quadrant of left female breast: Secondary | ICD-10-CM | POA: Diagnosis not present

## 2016-08-30 ENCOUNTER — Ambulatory Visit: Payer: 59

## 2016-08-31 ENCOUNTER — Inpatient Hospital Stay: Payer: 59

## 2016-08-31 ENCOUNTER — Ambulatory Visit: Payer: 59

## 2016-09-01 ENCOUNTER — Ambulatory Visit: Payer: 59

## 2016-09-02 ENCOUNTER — Ambulatory Visit: Payer: 59

## 2016-09-03 ENCOUNTER — Ambulatory Visit: Payer: 59

## 2016-09-06 ENCOUNTER — Ambulatory Visit
Admission: RE | Admit: 2016-09-06 | Discharge: 2016-09-06 | Disposition: A | Discharge status: Home / Self Care | Payer: 59 | Source: Ambulatory Visit | Attending: Radiation Oncology | Admitting: Radiation Oncology

## 2016-09-06 DIAGNOSIS — C50412 Malignant neoplasm of upper-outer quadrant of left female breast: Secondary | ICD-10-CM | POA: Diagnosis not present

## 2016-09-07 ENCOUNTER — Ambulatory Visit: Payer: 59

## 2016-09-07 ENCOUNTER — Inpatient Hospital Stay: Payer: 59 | Attending: Oncology

## 2016-09-07 ENCOUNTER — Ambulatory Visit
Admission: RE | Admit: 2016-09-07 | Discharge: 2016-09-07 | Disposition: A | Discharge status: Home / Self Care | Payer: 59 | Source: Ambulatory Visit | Attending: Radiation Oncology | Admitting: Radiation Oncology

## 2016-09-07 DIAGNOSIS — C50412 Malignant neoplasm of upper-outer quadrant of left female breast: Secondary | ICD-10-CM | POA: Diagnosis present

## 2016-09-07 DIAGNOSIS — C884 Extranodal marginal zone B-cell lymphoma of mucosa-associated lymphoid tissue [MALT-lymphoma]: Secondary | ICD-10-CM | POA: Diagnosis not present

## 2016-09-07 DIAGNOSIS — Z170 Estrogen receptor positive status [ER+]: Secondary | ICD-10-CM | POA: Diagnosis not present

## 2016-09-07 LAB — CBC
HCT: 37.1 % (ref 35.0–47.0)
Hemoglobin: 12.4 g/dL (ref 12.0–16.0)
MCH: 25.6 pg — AB (ref 26.0–34.0)
MCHC: 33.5 g/dL (ref 32.0–36.0)
MCV: 76.4 fL — AB (ref 80.0–100.0)
PLATELETS: 213 10*3/uL (ref 150–440)
RBC: 4.85 MIL/uL (ref 3.80–5.20)
RDW: 16.4 % — AB (ref 11.5–14.5)
WBC: 5.4 10*3/uL (ref 3.6–11.0)

## 2016-09-08 ENCOUNTER — Ambulatory Visit
Admission: RE | Admit: 2016-09-08 | Discharge: 2016-09-08 | Disposition: A | Discharge status: Home / Self Care | Payer: 59 | Source: Ambulatory Visit | Attending: Radiation Oncology | Admitting: Radiation Oncology

## 2016-09-08 ENCOUNTER — Ambulatory Visit: Payer: 59

## 2016-09-08 DIAGNOSIS — C50412 Malignant neoplasm of upper-outer quadrant of left female breast: Secondary | ICD-10-CM | POA: Diagnosis not present

## 2016-09-09 ENCOUNTER — Ambulatory Visit
Admission: RE | Admit: 2016-09-09 | Discharge: 2016-09-09 | Disposition: A | Discharge status: Home / Self Care | Payer: 59 | Source: Ambulatory Visit | Attending: Radiation Oncology | Admitting: Radiation Oncology

## 2016-09-09 DIAGNOSIS — I10 Essential (primary) hypertension: Secondary | ICD-10-CM | POA: Diagnosis not present

## 2016-09-09 DIAGNOSIS — C50412 Malignant neoplasm of upper-outer quadrant of left female breast: Secondary | ICD-10-CM | POA: Diagnosis not present

## 2016-09-09 DIAGNOSIS — I2510 Atherosclerotic heart disease of native coronary artery without angina pectoris: Secondary | ICD-10-CM | POA: Diagnosis not present

## 2016-09-09 DIAGNOSIS — I480 Paroxysmal atrial fibrillation: Secondary | ICD-10-CM | POA: Diagnosis not present

## 2016-09-10 ENCOUNTER — Ambulatory Visit
Admission: RE | Admit: 2016-09-10 | Discharge: 2016-09-10 | Disposition: A | Discharge status: Home / Self Care | Payer: 59 | Source: Ambulatory Visit | Attending: Radiation Oncology | Admitting: Radiation Oncology

## 2016-09-10 DIAGNOSIS — H40042 Steroid responder, left eye: Secondary | ICD-10-CM | POA: Diagnosis not present

## 2016-09-10 DIAGNOSIS — C50412 Malignant neoplasm of upper-outer quadrant of left female breast: Secondary | ICD-10-CM | POA: Diagnosis not present

## 2016-09-10 DIAGNOSIS — H40052 Ocular hypertension, left eye: Secondary | ICD-10-CM | POA: Diagnosis not present

## 2016-09-13 ENCOUNTER — Ambulatory Visit
Admission: RE | Admit: 2016-09-13 | Discharge: 2016-09-13 | Disposition: A | Discharge status: Home / Self Care | Payer: 59 | Source: Ambulatory Visit | Attending: Radiation Oncology | Admitting: Radiation Oncology

## 2016-09-13 DIAGNOSIS — C50412 Malignant neoplasm of upper-outer quadrant of left female breast: Secondary | ICD-10-CM | POA: Diagnosis not present

## 2016-09-14 ENCOUNTER — Ambulatory Visit
Admission: RE | Admit: 2016-09-14 | Discharge: 2016-09-14 | Disposition: A | Discharge status: Home / Self Care | Payer: 59 | Source: Ambulatory Visit | Attending: Radiation Oncology | Admitting: Radiation Oncology

## 2016-09-14 DIAGNOSIS — Z170 Estrogen receptor positive status [ER+]: Secondary | ICD-10-CM | POA: Diagnosis not present

## 2016-09-14 DIAGNOSIS — C50412 Malignant neoplasm of upper-outer quadrant of left female breast: Secondary | ICD-10-CM | POA: Diagnosis not present

## 2016-09-15 ENCOUNTER — Ambulatory Visit
Admission: RE | Admit: 2016-09-15 | Discharge: 2016-09-15 | Disposition: A | Discharge status: Home / Self Care | Payer: 59 | Source: Ambulatory Visit | Attending: Radiation Oncology | Admitting: Radiation Oncology

## 2016-09-15 DIAGNOSIS — Z853 Personal history of malignant neoplasm of breast: Secondary | ICD-10-CM | POA: Diagnosis not present

## 2016-09-15 DIAGNOSIS — C50412 Malignant neoplasm of upper-outer quadrant of left female breast: Secondary | ICD-10-CM | POA: Diagnosis not present

## 2016-09-16 ENCOUNTER — Encounter (INDEPENDENT_AMBULATORY_CARE_PROVIDER_SITE_OTHER): Payer: 59 | Admitting: Ophthalmology

## 2016-09-16 DIAGNOSIS — H35033 Hypertensive retinopathy, bilateral: Secondary | ICD-10-CM

## 2016-09-16 DIAGNOSIS — H348312 Tributary (branch) retinal vein occlusion, right eye, stable: Secondary | ICD-10-CM

## 2016-09-16 DIAGNOSIS — I10 Essential (primary) hypertension: Secondary | ICD-10-CM

## 2016-09-16 DIAGNOSIS — H43813 Vitreous degeneration, bilateral: Secondary | ICD-10-CM

## 2016-09-16 DIAGNOSIS — H338 Other retinal detachments: Secondary | ICD-10-CM

## 2016-09-16 DIAGNOSIS — H34812 Central retinal vein occlusion, left eye, with macular edema: Secondary | ICD-10-CM

## 2016-09-24 NOTE — Progress Notes (Signed)
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 MammaPrint low risk.  INTERVAL HISTORY:  Patient returns to clinic today for further evaluation and discussion of initiating an aromatase inhibitor. She tolerated her XRT well without significant side effects. She currently feels well and remains asymptomatic. She denies any fevers, night sweats, or weight loss. She has no neurologic complaints. She has noted no new lymphadenopathy. She has no chest pain or shortness of breath. She denies any nausea, vomiting, constipation, or diarrhea. She has no urinary complaints. Patient offers no specific complaints today.  REVIEW OF SYSTEMS:   Review of Systems  Constitutional: Negative for chills, diaphoresis, fever, malaise/fatigue and weight loss.  Respiratory: Negative.  Negative for cough and shortness of breath.   Cardiovascular: Negative.  Negative for chest pain and leg swelling.  Gastrointestinal: Negative.  Negative for abdominal pain.  Genitourinary: Negative.   Musculoskeletal: Negative.   Skin: Negative.  Negative for rash.  Neurological: Negative.  Negative for sensory change and weakness.  Psychiatric/Behavioral: Negative.  The patient is not nervous/anxious.     As per HPI. Otherwise, a complete review of systems is negative.  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 Anginal pain ([place])   . Arthritis t   knee  . Asthma    mild  . 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  Mamie Nick. at [facility] in [place]  . Depression   . Dyspnea   . Dysrhythmia   . GERD (gastroesophageal reflux disease)   . Hypertension   . Hypothyroidism   . MALT (mucosa associated lymphoid tissue) ([place]) 07/05/2014   Right neck mass resected 01/2014.  Marland Kitchen No pertinent past medical history   . Sleep apnea t   O2- 2l at bedtime and BiPap @ bedtime    PAST SURGICAL HISTORY: Past Surgical History:  Procedure Laterality Date  . BREAST BIOPSY Left 2/136/2018   path pending  . BREAST EXCISIONAL BIOPSY Left 06/04/2016   2 area NL prior to lumpectomy +  . CARDIAC CATHETERIZATION    . CARDIAC CATHETERIZATION N/A 09/24/2015   Procedure: Left Heart Cath and Coronary Angiography;  Surgeon: Isaias Cowman, MD;  Location: [facility];  Service: Cardiovascular;  Laterality: N/A;  . CARDIAC CATHETERIZATION N/A 09/24/2015   Procedure: Coronary Stent Intervention;  Surgeon: Isaias Cowman, MD;  Location: [facility];  Service: Cardiovascular;  Laterality: N/A;  . CHOLECYSTECTOMY  11/26   08/1970  . DILATATION & CURETTAGE/HYSTEROSCOPY WITH MYOSURE N/A 05/05/2015   Procedure: Hysteroscopy and endometrial curretage;  Surgeon: Boykin Nearing, MD;  Location: [facility];  Service: Gynecology;  Laterality: N/A;  . DILATION AND CURETTAGE OF UTERUS    . EXCISION MASS FROM NECK  Right    Dr. Tami Ribas  . EYE SURGERY  t   bil cataract 9/08  . HAMMER TOE SURGERY Right   . JOINT REPLACEMENT Bilateral 2008, 11/26/ 2012   Partial Knee Replacements  . PARTIAL MASTECTOMY WITH NEEDLE  LOCALIZATION Left 06/04/2016   Procedure: PARTIAL MASTECTOMY WITH NEEDLE LOCALIZATION;  Surgeon: Leonie Green, MD;  Location: [facility];  Service: General;  Laterality: Left;  . SCLERAL BUCKLE  02/16/2011   Procedure: SCLERAL BUCKLE;  Surgeon: Hayden Pedro, MD;  Location: [facility];  Service: Ophthalmology;  Laterality: Left;  Scleral Buckle Left Eye with Headscope Laser  . SENTINEL NODE BIOPSY Left  06/04/2016   Procedure: SENTINEL NODE BIOPSY;  Surgeon: Leonie Green, MD;  Location: [facility];  Service: General;  Laterality: Left;    FAMILY HISTORY Family History  Problem Relation Age of Onset  . Breast cancer Mother 68  . Heart disease Father   . Breast cancer Paternal Aunt 73  . Breast cancer Cousin   . Anesthesia problems Neg Hx   . Hypotension Neg Hx   . Malignant hyperthermia Neg Hx   . Pseudochol deficiency Neg Hx     GYNECOLOGIC HISTORY:  No LMP recorded. Patient is postmenopausal.     ADVANCED DIRECTIVES:    HEALTH MAINTENANCE: Social History  Substance Use Topics  . Smoking status: Never Smoker  . Smokeless tobacco: Never Used  . Alcohol use No     Colonoscopy:  PAP:  Bone density:  Mammogram: November 2016  Allergies  Allergen Reactions  . Codeine Other (See Comments)    Stroke like symptoms    Current Outpatient Prescriptions  Medication Sig Dispense Refill  . apixaban (ELIQUIS) 5 MG TABS tablet Take 5 mg by mouth 2 (two) times daily.    Marland Kitchen Besifloxacin HCl (BESIVANCE) 0.6 % SUSP Place 1 drop into the left eye See admin instructions. AFTER EYE INJECTION    . brimonidine (ALPHAGAN) 0.2 % ophthalmic solution Place 1 drop into the left eye 2 (two) times daily.    . Cholecalciferol (VITAMIN D-1000 MAX ST) 1000 units tablet Take 1,000 Units by mouth daily.    . clopidogrel (PLAVIX) 75 MG tablet Take 1 tablet (75 mg total) by mouth daily with breakfast. 30 tablet 5  . diltiazem (CARDIZEM CD) 240 MG 24 hr capsule Take 240 mg by mouth daily.      . Fluticasone-Salmeterol (ADVAIR) 100-50 MCG/DOSE AEPB Inhale 1 puff into the lungs 2 (two) times daily as needed (for asthma.).    Marland Kitchen isosorbide mononitrate (IMDUR) 30 MG 24 hr tablet Take 30 mg by mouth daily.    Marland Kitchen levothyroxine (SYNTHROID, LEVOTHROID) 150 MCG tablet Take 150 mcg by mouth daily.      . Melatonin 5 MG TABS Take 5 mg by mouth at bedtime.    . metoprolol (TOPROL-XL) 100 MG 24 hr tablet Take 50  mg by mouth 2 (two) times daily.     . nitroGLYCERIN (NITROSTAT) 0.4 MG SL tablet Place 0.4 mg under the tongue every 5 (five) minutes as needed for chest pain. Maximum 3 doses, If no relief call md or 911.    Marland Kitchen oxybutynin (DITROPAN) 5 MG tablet Take 5 mg by mouth daily.    . potassium chloride (K-DUR) 10 MEQ tablet Take 1 tablet (10 mEq total) by mouth daily. 30 tablet 5  . RABEprazole (ACIPHEX) 20 MG tablet Take 20 mg by mouth daily.      . sertraline (ZOLOFT) 50 MG tablet Take 50 mg by mouth at bedtime.      . simvastatin (ZOCOR) 40 MG tablet Take 40 mg by mouth at bedtime.      . torsemide (DEMADEX) 20 MG tablet Take 20 mg by mouth daily as  needed.     . vitamin B-12 (CYANOCOBALAMIN) 100 MCG tablet Take 100 mcg by mouth daily.     No current facility-administered medications for this visit.     OBJECTIVE: BP (!) 148/84   Pulse (!) 52   Temp 98.4 F (36.9 C) (Tympanic)   Resp 20   Wt 191 lb 12.8 oz (87 kg)   BMI 33.98 kg/m    Body mass index is 33.98 kg/m.    ECOG FS:0 - Asymptomatic  General: Well-developed, well-nourished, no acute distress. Eyes: Pink conjunctiva, anicteric sclera. Breasts: Well-healed surgical scar on left breast. HEENT: Normocephalic, moist mucous membranes, clear oropharnyx. No palpable lymphadenopathy. Lungs: Clear to auscultation bilaterally. Heart: Regular rate and rhythm. No rubs, murmurs, or gallops. Abdomen: Soft, nontender, nondistended. No organomegaly noted, normoactive bowel sounds. Musculoskeletal: No edema, cyanosis, or clubbing. Neuro: Alert, answering all questions appropriately. Cranial nerves grossly intact. Skin: No rashes or petechiae noted. Psych: Normal affect. Lymphatics: No cervical, clavicular, or axillary LAD.   LAB RESULTS:   STUDIES: No results found.  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 MammaPrint low  risk.  PLAN:    1. 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 MammaPrint was reported as low risk, therefore she did not require adjuvant chemotherapy. She has now completed her XRT and was given a prescription for letrozole which she will take for 5 years completing in July 2018. We will get a baseline bone mineral density in the next 1-2 weeks. Patient will follow-up in clinic at the end of August at her previously scheduled follow-up appointment to discuss her imaging results for her lymphoma and further evaluation.  2. MALT. Patient is status post excision of a right submandibular gland on February 05, 2014. Patient's most recent CT scans on November 14, 2015 reviewed independently and reported no obvious evidence of recurrent disease. No intervention is needed at this time. Patient is now over 2 years removed from her surgery and can be monitored with yearly imaging. Will repeat CT scan in August 2018.   Approximately 30 minutes was spent in discussion of which greater than 50% was consultation.  Patient expressed understanding and was in agreement with this plan. She also understands that She can call clinic at any time with any questions, concerns, or complaints.     Lloyd Huger, MD   09/28/2016 3:29 PM

## 2016-09-28 ENCOUNTER — Inpatient Hospital Stay: Payer: 59 | Attending: Oncology | Admitting: Oncology

## 2016-09-28 VITALS — BP 148/84 | HR 52 | Temp 98.4°F | Resp 20 | Wt 191.8 lb

## 2016-09-28 DIAGNOSIS — Z7902 Long term (current) use of antithrombotics/antiplatelets: Secondary | ICD-10-CM

## 2016-09-28 DIAGNOSIS — K219 Gastro-esophageal reflux disease without esophagitis: Secondary | ICD-10-CM

## 2016-09-28 DIAGNOSIS — J45909 Unspecified asthma, uncomplicated: Secondary | ICD-10-CM | POA: Diagnosis not present

## 2016-09-28 DIAGNOSIS — Z803 Family history of malignant neoplasm of breast: Secondary | ICD-10-CM | POA: Insufficient documentation

## 2016-09-28 DIAGNOSIS — I10 Essential (primary) hypertension: Secondary | ICD-10-CM | POA: Diagnosis not present

## 2016-09-28 DIAGNOSIS — Z79899 Other long term (current) drug therapy: Secondary | ICD-10-CM | POA: Insufficient documentation

## 2016-09-28 DIAGNOSIS — Z170 Estrogen receptor positive status [ER+]: Secondary | ICD-10-CM | POA: Diagnosis not present

## 2016-09-28 DIAGNOSIS — I2510 Atherosclerotic heart disease of native coronary artery without angina pectoris: Secondary | ICD-10-CM | POA: Diagnosis not present

## 2016-09-28 DIAGNOSIS — Z923 Personal history of irradiation: Secondary | ICD-10-CM | POA: Insufficient documentation

## 2016-09-28 DIAGNOSIS — E039 Hypothyroidism, unspecified: Secondary | ICD-10-CM

## 2016-09-28 DIAGNOSIS — Z7901 Long term (current) use of anticoagulants: Secondary | ICD-10-CM

## 2016-09-28 DIAGNOSIS — G4730 Sleep apnea, unspecified: Secondary | ICD-10-CM | POA: Diagnosis not present

## 2016-09-28 DIAGNOSIS — C884 Extranodal marginal zone B-cell lymphoma of mucosa-associated lymphoid tissue [MALT-lymphoma]: Secondary | ICD-10-CM | POA: Diagnosis not present

## 2016-09-28 DIAGNOSIS — C50412 Malignant neoplasm of upper-outer quadrant of left female breast: Secondary | ICD-10-CM | POA: Diagnosis not present

## 2016-09-28 MED ORDER — LETROZOLE 2.5 MG PO TABS
2.5000 mg | ORAL_TABLET | Freq: Every day | ORAL | 11 refills | Status: DC
Start: 2016-09-28 — End: 2017-09-17

## 2016-09-28 NOTE — Progress Notes (Signed)
Patient denies any concerns today.  

## 2016-10-06 ENCOUNTER — Ambulatory Visit
Admission: RE | Admit: 2016-10-06 | Discharge: 2016-10-06 | Disposition: A | Discharge status: Home / Self Care | Payer: 59 | Source: Ambulatory Visit | Attending: Oncology | Admitting: Oncology

## 2016-10-06 DIAGNOSIS — C50412 Malignant neoplasm of upper-outer quadrant of left female breast: Secondary | ICD-10-CM | POA: Insufficient documentation

## 2016-10-06 DIAGNOSIS — Z79899 Other long term (current) drug therapy: Secondary | ICD-10-CM | POA: Insufficient documentation

## 2016-10-06 DIAGNOSIS — Z780 Asymptomatic menopausal state: Secondary | ICD-10-CM | POA: Insufficient documentation

## 2016-10-06 DIAGNOSIS — Z13820 Encounter for screening for osteoporosis: Secondary | ICD-10-CM | POA: Diagnosis not present

## 2016-10-08 DIAGNOSIS — Z79899 Other long term (current) drug therapy: Secondary | ICD-10-CM | POA: Diagnosis not present

## 2016-10-08 DIAGNOSIS — S060X0A Concussion without loss of consciousness, initial encounter: Secondary | ICD-10-CM | POA: Diagnosis not present

## 2016-10-08 DIAGNOSIS — R55 Syncope and collapse: Secondary | ICD-10-CM | POA: Diagnosis not present

## 2016-10-13 DIAGNOSIS — D229 Melanocytic nevi, unspecified: Secondary | ICD-10-CM | POA: Diagnosis not present

## 2016-10-13 DIAGNOSIS — Z1283 Encounter for screening for malignant neoplasm of skin: Secondary | ICD-10-CM | POA: Diagnosis not present

## 2016-10-13 DIAGNOSIS — D485 Neoplasm of uncertain behavior of skin: Secondary | ICD-10-CM | POA: Diagnosis not present

## 2016-10-25 ENCOUNTER — Ambulatory Visit
Admission: RE | Admit: 2016-10-25 | Discharge: 2016-10-25 | Disposition: A | Discharge status: Home / Self Care | Payer: 59 | Source: Ambulatory Visit | Attending: Radiation Oncology | Admitting: Radiation Oncology

## 2016-10-25 VITALS — BP 154/85 | HR 50 | Temp 98.1°F | Wt 191.2 lb

## 2016-10-25 DIAGNOSIS — Z170 Estrogen receptor positive status [ER+]: Secondary | ICD-10-CM | POA: Insufficient documentation

## 2016-10-25 DIAGNOSIS — Z923 Personal history of irradiation: Secondary | ICD-10-CM | POA: Insufficient documentation

## 2016-10-25 DIAGNOSIS — C50912 Malignant neoplasm of unspecified site of left female breast: Secondary | ICD-10-CM | POA: Diagnosis not present

## 2016-10-25 DIAGNOSIS — Z79811 Long term (current) use of aromatase inhibitors: Secondary | ICD-10-CM | POA: Diagnosis not present

## 2016-10-25 DIAGNOSIS — C50412 Malignant neoplasm of upper-outer quadrant of left female breast: Secondary | ICD-10-CM

## 2016-10-25 NOTE — Progress Notes (Signed)
Radiation Oncology Follow up Note  Name: Stacey Stone   Date:   10/25/2016 MRN:  [id number] DOB: [birth year]    This 71 y.o. female presents to the clinic today for one-month follow-up status post whole breast radiation to her left breast for ER positive invasive mammary carcinoma stage I.  REFERRING PROVIDER: Rusty Aus, MD  HPI: patient is a 72 year old female now out 1 month having completed whole breast radiation to her left breast for ER/PR positive PR and HER-2/neu negative invasive mammary carcinoma status post wide local excision and sentinel node biopsy. Seen today in routine follow-up one month out she is doing well. She specifically denies breast tenderness cough or bone pain. She has been started on.Femara and tolerating that well without side effect.  COMPLICATIONS OF TREATMENT: none  FOLLOW UP COMPLIANCE: keeps appointments   PHYSICAL EXAM:  BP (!) 154/85   Pulse (!) 50   Temp 98.1 F (36.7 C)   Wt 191 lb 4 oz (86.7 kg)   BMI 33.88 kg/m  Lungs are clear to A&P cardiac examination essentially unremarkable with regular rate and rhythm. No dominant mass or nodularity is noted in either breast in 2 positions examined. Incision is well-healed. No axillary or supraclavicular adenopathy is appreciated. Cosmetic result is excellent. Well-developed well-nourished patient in NAD. HEENT reveals PERLA, EOMI, discs not visualized.  Oral cavity is clear. No oral mucosal lesions are identified. Neck is clear without evidence of cervical or supraclavicular adenopathy. Lungs are clear to A&P. Cardiac examination is essentially unremarkable with regular rate and rhythm without murmur rub or thrill. Abdomen is benign with no organomegaly or masses noted. Motor sensory and DTR levels are equal and symmetric in the upper and lower extremities. Cranial nerves II through XII are grossly intact. Proprioception is intact. No peripheral adenopathy or edema is identified. No motor or sensory  levels are noted. Crude visual fields are within normal range.  RADIOLOGY RESULTS: no current films for review  PLAN: at the present time patient is doing well 1 month out from whole breast radiation. I'm please were overall progress. She has started on Femara is tolerating that well without side effect. I've asked to see her back in 4-5 months for follow-up. Patient is to call sooner with any concerns.  I would like to take this opportunity to thank you for allowing me to participate in the care of your patient.Armstead Peaks., MD

## 2016-11-01 DIAGNOSIS — H1132 Conjunctival hemorrhage, left eye: Secondary | ICD-10-CM | POA: Diagnosis not present

## 2016-11-01 DIAGNOSIS — I10 Essential (primary) hypertension: Secondary | ICD-10-CM | POA: Diagnosis not present

## 2016-11-01 DIAGNOSIS — H40052 Ocular hypertension, left eye: Secondary | ICD-10-CM | POA: Diagnosis not present

## 2016-11-11 ENCOUNTER — Encounter (INDEPENDENT_AMBULATORY_CARE_PROVIDER_SITE_OTHER): Payer: 59 | Admitting: Ophthalmology

## 2016-11-11 DIAGNOSIS — H338 Other retinal detachments: Secondary | ICD-10-CM | POA: Diagnosis not present

## 2016-11-11 DIAGNOSIS — H43813 Vitreous degeneration, bilateral: Secondary | ICD-10-CM

## 2016-11-11 DIAGNOSIS — H34812 Central retinal vein occlusion, left eye, with macular edema: Secondary | ICD-10-CM | POA: Diagnosis not present

## 2016-11-11 DIAGNOSIS — I10 Essential (primary) hypertension: Secondary | ICD-10-CM | POA: Diagnosis not present

## 2016-11-11 DIAGNOSIS — H35033 Hypertensive retinopathy, bilateral: Secondary | ICD-10-CM | POA: Diagnosis not present

## 2016-11-11 DIAGNOSIS — H348312 Tributary (branch) retinal vein occlusion, right eye, stable: Secondary | ICD-10-CM | POA: Diagnosis not present

## 2016-11-16 ENCOUNTER — Ambulatory Visit
Admission: RE | Admit: 2016-11-16 | Discharge: 2016-11-16 | Disposition: A | Discharge status: Home / Self Care | Payer: Commercial Managed Care - HMO | Source: Ambulatory Visit | Attending: Oncology | Admitting: Oncology

## 2016-11-16 ENCOUNTER — Inpatient Hospital Stay: Payer: 59

## 2016-11-16 DIAGNOSIS — C884 Extranodal marginal zone B-cell lymphoma of mucosa-associated lymphoid tissue [MALT-lymphoma]: Secondary | ICD-10-CM | POA: Diagnosis not present

## 2016-11-16 DIAGNOSIS — R918 Other nonspecific abnormal finding of lung field: Secondary | ICD-10-CM | POA: Diagnosis not present

## 2016-11-16 DIAGNOSIS — I2510 Atherosclerotic heart disease of native coronary artery without angina pectoris: Secondary | ICD-10-CM | POA: Diagnosis not present

## 2016-11-16 DIAGNOSIS — Z923 Personal history of irradiation: Secondary | ICD-10-CM | POA: Diagnosis not present

## 2016-11-16 DIAGNOSIS — Z9989 Dependence on other enabling machines and devices: Secondary | ICD-10-CM | POA: Insufficient documentation

## 2016-11-16 DIAGNOSIS — Z780 Asymptomatic menopausal state: Secondary | ICD-10-CM | POA: Diagnosis not present

## 2016-11-16 DIAGNOSIS — K219 Gastro-esophageal reflux disease without esophagitis: Secondary | ICD-10-CM | POA: Diagnosis not present

## 2016-11-16 DIAGNOSIS — N289 Disorder of kidney and ureter, unspecified: Secondary | ICD-10-CM | POA: Insufficient documentation

## 2016-11-16 DIAGNOSIS — C50412 Malignant neoplasm of upper-outer quadrant of left female breast: Secondary | ICD-10-CM | POA: Insufficient documentation

## 2016-11-16 DIAGNOSIS — E039 Hypothyroidism, unspecified: Secondary | ICD-10-CM | POA: Insufficient documentation

## 2016-11-16 DIAGNOSIS — D259 Leiomyoma of uterus, unspecified: Secondary | ICD-10-CM | POA: Insufficient documentation

## 2016-11-16 DIAGNOSIS — I480 Paroxysmal atrial fibrillation: Secondary | ICD-10-CM | POA: Diagnosis not present

## 2016-11-16 DIAGNOSIS — I700 Atherosclerosis of aorta: Secondary | ICD-10-CM | POA: Diagnosis not present

## 2016-11-16 DIAGNOSIS — I10 Essential (primary) hypertension: Secondary | ICD-10-CM | POA: Insufficient documentation

## 2016-11-16 DIAGNOSIS — K5730 Diverticulosis of large intestine without perforation or abscess without bleeding: Secondary | ICD-10-CM | POA: Diagnosis not present

## 2016-11-16 DIAGNOSIS — Z79899 Other long term (current) drug therapy: Secondary | ICD-10-CM | POA: Insufficient documentation

## 2016-11-16 DIAGNOSIS — Z7902 Long term (current) use of antithrombotics/antiplatelets: Secondary | ICD-10-CM | POA: Insufficient documentation

## 2016-11-16 DIAGNOSIS — Z170 Estrogen receptor positive status [ER+]: Secondary | ICD-10-CM | POA: Diagnosis not present

## 2016-11-16 DIAGNOSIS — K5732 Diverticulitis of large intestine without perforation or abscess without bleeding: Secondary | ICD-10-CM | POA: Diagnosis not present

## 2016-11-16 DIAGNOSIS — G4730 Sleep apnea, unspecified: Secondary | ICD-10-CM | POA: Diagnosis not present

## 2016-11-16 DIAGNOSIS — Z7901 Long term (current) use of anticoagulants: Secondary | ICD-10-CM | POA: Insufficient documentation

## 2016-11-16 DIAGNOSIS — Z9049 Acquired absence of other specified parts of digestive tract: Secondary | ICD-10-CM | POA: Insufficient documentation

## 2016-11-16 LAB — COMPREHENSIVE METABOLIC PANEL
ALBUMIN: 3.7 g/dL (ref 3.5–5.0)
ALT: 20 U/L (ref 14–54)
ANION GAP: 10 (ref 5–15)
AST: 24 U/L (ref 15–41)
Alkaline Phosphatase: 124 U/L (ref 38–126)
BILIRUBIN TOTAL: 0.6 mg/dL (ref 0.3–1.2)
BUN: 16 mg/dL (ref 6–20)
CALCIUM: 9.8 mg/dL (ref 8.9–10.3)
CO2: 26 mmol/L (ref 22–32)
Chloride: 100 mmol/L — ABNORMAL LOW (ref 101–111)
Creatinine, Ser: 1.13 mg/dL — ABNORMAL HIGH (ref 0.44–1.00)
GFR calc Af Amer: 55 mL/min — ABNORMAL LOW (ref >60)
GFR calc non Af Amer: 47 mL/min — ABNORMAL LOW (ref >60)
Glucose, Bld: 101 mg/dL — ABNORMAL HIGH (ref 65–99)
POTASSIUM: 3.7 mmol/L (ref 3.5–5.1)
SODIUM: 136 mmol/L (ref 135–145)
TOTAL PROTEIN: 7.3 g/dL (ref 6.5–8.1)

## 2016-11-16 LAB — CBC WITH DIFFERENTIAL/PLATELET
BASOS PCT: 1 %
Basophils Absolute: 0.1 10*3/uL (ref 0–0.1)
Eosinophils Absolute: 0.2 10*3/uL (ref 0–0.7)
Eosinophils Relative: 3 %
HCT: 39.2 % (ref 35.0–47.0)
Hemoglobin: 13.1 g/dL (ref 12.0–16.0)
Lymphocytes Relative: 27 %
Lymphs Abs: 1.8 10*3/uL (ref 1.0–3.6)
MCH: 25.5 pg — ABNORMAL LOW (ref 26.0–34.0)
MCHC: 33.5 g/dL (ref 32.0–36.0)
MCV: 76.2 fL — ABNORMAL LOW (ref 80.0–100.0)
MONO ABS: 0.5 10*3/uL (ref 0.2–0.9)
MONOS PCT: 8 %
Neutro Abs: 4.3 10*3/uL (ref 1.4–6.5)
Neutrophils Relative %: 61 %
Platelets: 240 10*3/uL (ref 150–440)
RBC: 5.14 MIL/uL (ref 3.80–5.20)
RDW: 16.1 % — AB (ref 11.5–14.5)
WBC: 6.9 10*3/uL (ref 3.6–11.0)

## 2016-11-16 MED ORDER — IOPAMIDOL (ISOVUE-300) INJECTION 61%
100.0000 mL | Freq: Once | INTRAVENOUS | Status: AC | PRN
  Administered 2016-11-16: 100 mL via INTRAVENOUS

## 2016-11-16 NOTE — Progress Notes (Signed)
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 MammaPrint low risk.  INTERVAL HISTORY:  Patient returns to clinic today for further evaluation and discussion of her imaging results. She is tolerating letrozole well without significant side effects. She currently feels well and remains asymptomatic. She denies any fevers, night sweats, or weight loss. She has no neurologic complaints. She has noted no new lymphadenopathy. She has no chest pain or shortness of breath. She denies any nausea, vomiting, constipation, or diarrhea. She has no urinary complaints. Patient offers no specific complaints today.  REVIEW OF SYSTEMS:   Review of Systems  Constitutional: Negative for chills, diaphoresis, fever, malaise/fatigue and weight loss.  Respiratory: Negative.  Negative for cough and shortness of breath.   Cardiovascular: Negative.  Negative for chest pain and leg swelling.  Gastrointestinal: Negative.  Negative for abdominal pain.  Genitourinary: Negative.   Musculoskeletal: Negative.   Skin: Negative.  Negative for rash.  Neurological: Negative.  Negative for sensory change and weakness.  Psychiatric/Behavioral: Negative.  The patient is not nervous/anxious.     As per HPI. Otherwise, a complete review of systems is negative.  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 Anginal pain ([place])   . Arthritis t   knee  . Asthma    mild  . 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 Mamie Nick. at  [facility] in [place]  . Depression   . Dyspnea   . Dysrhythmia   . GERD (gastroesophageal reflux disease)   . Hypertension   . Hypothyroidism   . MALT (mucosa associated lymphoid tissue) ([place]) 07/05/2014   Right neck mass resected 01/2014.  Marland Kitchen No pertinent past medical history   . Sleep apnea t   O2- 2l at bedtime and BiPap @ bedtime    PAST SURGICAL HISTORY: Past Surgical History:  Procedure Laterality Date  . BREAST BIOPSY Left 2/136/2018   path pending  . BREAST EXCISIONAL BIOPSY Left 06/04/2016   2 area NL prior to lumpectomy +  . CARDIAC CATHETERIZATION    . CARDIAC CATHETERIZATION N/A 09/24/2015   Procedure: Left Heart Cath and Coronary Angiography;  Surgeon: Isaias Cowman, MD;  Location: [facility];  Service: Cardiovascular;  Laterality: N/A;  . CARDIAC CATHETERIZATION N/A 09/24/2015   Procedure: Coronary Stent Intervention;  Surgeon: Isaias Cowman, MD;  Location: [facility];  Service: Cardiovascular;  Laterality: N/A;  . CHOLECYSTECTOMY  11/26   08/1970  . DILATATION & CURETTAGE/HYSTEROSCOPY WITH MYOSURE N/A 05/05/2015   Procedure: Hysteroscopy and endometrial curretage;  Surgeon: Boykin Nearing, MD;  Location: [facility];  Service: Gynecology;  Laterality: N/A;  . DILATION AND CURETTAGE OF UTERUS    . EXCISION MASS FROM NECK  Right    Dr. Tami Ribas  . EYE SURGERY  t   bil cataract 9/08  . HAMMER TOE SURGERY Right   . JOINT REPLACEMENT Bilateral 2008, 11/26/ 2012   Partial Knee Replacements  . PARTIAL MASTECTOMY WITH NEEDLE LOCALIZATION  Left 06/04/2016   Procedure: PARTIAL MASTECTOMY WITH NEEDLE LOCALIZATION;  Surgeon: Leonie Green, MD;  Location: [facility];  Service: General;  Laterality: Left;  . SCLERAL BUCKLE  02/16/2011   Procedure: SCLERAL BUCKLE;  Surgeon: Hayden Pedro, MD;  Location: [facility];  Service: Ophthalmology;  Laterality: Left;  Scleral Buckle Left Eye with Headscope Laser  . SENTINEL NODE BIOPSY Left  06/04/2016   Procedure: SENTINEL NODE BIOPSY;  Surgeon: Leonie Green, MD;  Location: [facility];  Service: General;  Laterality: Left;    FAMILY HISTORY Family History  Problem Relation Age of Onset  . Breast cancer Mother 17  . Heart disease Father   . Breast cancer Paternal Aunt 73  . Breast cancer Cousin   . Anesthesia problems Neg Hx   . Hypotension Neg Hx   . Malignant hyperthermia Neg Hx   . Pseudochol deficiency Neg Hx     GYNECOLOGIC HISTORY:  No LMP recorded. Patient is postmenopausal.     ADVANCED DIRECTIVES:    HEALTH MAINTENANCE: Social History  Substance Use Topics  . Smoking status: Never Smoker  . Smokeless tobacco: Never Used  . Alcohol use No     Colonoscopy:  PAP:  Bone density:  Mammogram: November 2016  Allergies  Allergen Reactions  . Codeine Other (See Comments)    Stroke like symptoms    Current Outpatient Prescriptions  Medication Sig Dispense Refill  . apixaban (ELIQUIS) 5 MG TABS tablet Take 5 mg by mouth 2 (two) times daily.    Marland Kitchen Besifloxacin HCl (BESIVANCE) 0.6 % SUSP Place 1 drop into the left eye See admin instructions. AFTER EYE INJECTION    . brimonidine (ALPHAGAN) 0.2 % ophthalmic solution Place 1 drop into the left eye 2 (two) times daily.    . Cholecalciferol (VITAMIN D-1000 MAX ST) 1000 units tablet Take 1,000 Units by mouth daily.    . clopidogrel (PLAVIX) 75 MG tablet Take 1 tablet (75 mg total) by mouth daily with breakfast. 30 tablet 5  . diltiazem (CARDIZEM CD) 300 MG 24 hr capsule Take 300 mg by mouth daily.    . Fluticasone-Salmeterol (ADVAIR) 100-50 MCG/DOSE AEPB Inhale 1 puff into the lungs 2 (two) times daily as needed (for asthma.).    Marland Kitchen isosorbide mononitrate (IMDUR) 30 MG 24 hr tablet Take 30 mg by mouth daily.    Marland Kitchen letrozole (FEMARA) 2.5 MG tablet Take 1 tablet (2.5 mg total) by mouth daily. 30 tablet 11  . levothyroxine (SYNTHROID, LEVOTHROID) 150 MCG tablet Take 150 mcg by mouth daily.      . Melatonin 5 MG  TABS Take 5 mg by mouth at bedtime.    . metoprolol (TOPROL-XL) 100 MG 24 hr tablet Take 50 mg by mouth 2 (two) times daily.     . nitroGLYCERIN (NITROSTAT) 0.4 MG SL tablet Place 0.4 mg under the tongue every 5 (five) minutes as needed for chest pain. Maximum 3 doses, If no relief call md or 911.    Marland Kitchen oxybutynin (DITROPAN) 5 MG tablet Take 5 mg by mouth daily.    . potassium chloride (K-DUR) 10 MEQ tablet Take 1 tablet (10 mEq total) by mouth daily. 30 tablet 5  . RABEprazole (ACIPHEX) 20 MG tablet Take 20 mg by mouth daily.      . sertraline (ZOLOFT) 50 MG tablet Take 50 mg by mouth at bedtime.      . simvastatin (ZOCOR) 40 MG tablet Take 40 mg by mouth at bedtime.      Marland Kitchen  torsemide (DEMADEX) 20 MG tablet Take 20 mg by mouth daily as needed.     . vitamin B-12 (CYANOCOBALAMIN) 100 MCG tablet Take 100 mcg by mouth daily.     No current facility-administered medications for this visit.     OBJECTIVE: BP 134/77   Pulse (!) 54   Temp 97.9 F (36.6 C) (Tympanic)   Resp 20   Wt 193 lb 3.2 oz (87.6 kg)   BMI 34.22 kg/m    Body mass index is 34.22 kg/m.    ECOG FS:0 - Asymptomatic  General: Well-developed, well-nourished, no acute distress. Eyes: Pink conjunctiva, anicteric sclera. Breasts: Well-healed surgical scar on left breast. HEENT: Normocephalic, moist mucous membranes, clear oropharnyx. No palpable lymphadenopathy. Lungs: Clear to auscultation bilaterally. Heart: Regular rate and rhythm. No rubs, murmurs, or gallops. Abdomen: Soft, nontender, nondistended. No organomegaly noted, normoactive bowel sounds. Musculoskeletal: No edema, cyanosis, or clubbing. Neuro: Alert, answering all questions appropriately. Cranial nerves grossly intact. Skin: No rashes or petechiae noted. Psych: Normal affect. Lymphatics: No cervical, clavicular, or axillary LAD.   LAB RESULTS:   STUDIES: Ct Chest W Contrast  Result Date: 11/16/2016 CLINICAL DATA:  Followup MALT lymphoma. Personal history  of left breast carcinoma. EXAM: CT CHEST, ABDOMEN, AND PELVIS WITH CONTRAST TECHNIQUE: Multidetector CT imaging of the chest, abdomen and pelvis was performed following the standard protocol during bolus administration of intravenous contrast. CONTRAST:  174m ISOVUE-300 IOPAMIDOL (ISOVUE-300) INJECTION 61% COMPARISON:  11/14/2015 FINDINGS: CT CHEST FINDINGS Cardiovascular: No acute findings. Aortic and coronary artery atherosclerosis. Mediastinum/Lymph Nodes: No masses or pathologically enlarged lymph nodes identified. Left breast lumpectomy site noted. No axillary lymphadenopathy. Lungs/Pleura: No pulmonary infiltrate or mass identified. Stable tiny sub-cm right middle lobe pulmonary nodule. No effusion present. Musculoskeletal:  No suspicious bone lesions identified. CT ABDOMEN AND PELVIS FINDINGS Hepatobiliary: No masses identified. Prior cholecystectomy. No evidence of biliary dilatation. Pancreas:  No mass or inflammatory changes. Spleen:  Within normal limits in size and appearance. Adrenals/Urinary tract: 10 mm subcapsular lesion in the midpole of the left kidney is increased in size from 7 mm previously. This could represent a proteinaceous cyst or small enhancing mass. No other renal lesions identified. No evidence of hydronephrosis. Stomach/Bowel: No evidence of obstruction, inflammatory process, or abnormal fluid collections. Diverticulosis again noted, without evidence of diverticulitis. Vascular/Lymphatic: No pathologically enlarged lymph nodes identified. No abdominal aortic aneurysm. Aortic atherosclerosis. Reproductive: Stable small uterine fibroid measuring approximately 2.5 cm. Adnexal regions are unremarkable. Other:  None. Musculoskeletal:  No suspicious bone lesions identified. IMPRESSION: No evidence of recurrent lymphoma. Increased size of 10 mm subcapsular left renal lesion, which is too small to characterize but could represent a proteinaceous cyst or solid renal neoplasm. Abdomen MRI  without and with contrast recommended for further characterization. Stable small uterine fibroid. Colonic diverticulosis, without radiographic evidence of diverticulitis. Aortic and coronary artery atherosclerosis. Electronically Signed   By: JEarle GellM.D.   On: 11/16/2016 15:02   Ct Abdomen Pelvis W Contrast  Result Date: 11/16/2016 CLINICAL DATA:  Followup MALT lymphoma. Personal history of left breast carcinoma. EXAM: CT CHEST, ABDOMEN, AND PELVIS WITH CONTRAST TECHNIQUE: Multidetector CT imaging of the chest, abdomen and pelvis was performed following the standard protocol during bolus administration of intravenous contrast. CONTRAST:  1025mISOVUE-300 IOPAMIDOL (ISOVUE-300) INJECTION 61% COMPARISON:  11/14/2015 FINDINGS: CT CHEST FINDINGS Cardiovascular: No acute findings. Aortic and coronary artery atherosclerosis. Mediastinum/Lymph Nodes: No masses or pathologically enlarged lymph nodes identified. Left breast lumpectomy site noted. No axillary lymphadenopathy. Lungs/Pleura:  No pulmonary infiltrate or mass identified. Stable tiny sub-cm right middle lobe pulmonary nodule. No effusion present. Musculoskeletal:  No suspicious bone lesions identified. CT ABDOMEN AND PELVIS FINDINGS Hepatobiliary: No masses identified. Prior cholecystectomy. No evidence of biliary dilatation. Pancreas:  No mass or inflammatory changes. Spleen:  Within normal limits in size and appearance. Adrenals/Urinary tract: 10 mm subcapsular lesion in the midpole of the left kidney is increased in size from 7 mm previously. This could represent a proteinaceous cyst or small enhancing mass. No other renal lesions identified. No evidence of hydronephrosis. Stomach/Bowel: No evidence of obstruction, inflammatory process, or abnormal fluid collections. Diverticulosis again noted, without evidence of diverticulitis. Vascular/Lymphatic: No pathologically enlarged lymph nodes identified. No abdominal aortic aneurysm. Aortic atherosclerosis.  Reproductive: Stable small uterine fibroid measuring approximately 2.5 cm. Adnexal regions are unremarkable. Other:  None. Musculoskeletal:  No suspicious bone lesions identified. IMPRESSION: No evidence of recurrent lymphoma. Increased size of 10 mm subcapsular left renal lesion, which is too small to characterize but could represent a proteinaceous cyst or solid renal neoplasm. Abdomen MRI without and with contrast recommended for further characterization. Stable small uterine fibroid. Colonic diverticulosis, without radiographic evidence of diverticulitis. Aortic and coronary artery atherosclerosis. Electronically Signed   By: Earle Gell M.D.   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 MammaPrint low risk.  PLAN:    1. 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 MammaPrint was reported as low risk, therefore she did not require adjuvant chemotherapy. She also completed adjuvant XRT. Continue letrozole for total 5 years completing in July 2023. Return to clinic in 6 months for routine evaluation.  2. MALT. Patient is status post excision of a right submandibular gland on February 05, 2014. Patient's most recent CT scans on November 16, 2016 reviewed independently and reported as above no obvious evidence of recurrent disease. No intervention is needed at this time. Patient is now over 2 years removed from her surgery and can be monitored with yearly imaging. Will repeat CT scan in August 2019.  3. Renal lesion: Patient noted to have an increased size of left subcapsular renal lesion that is too small to characterize. Will get an abdominal MRI for further evaluation in 6 months prior to her next clinic  visit.  Approximately 30 minutes was spent in discussion of which greater than 50% was consultation.  Patient expressed understanding and was in agreement with this plan. She also understands that She can call clinic at any time with any questions, concerns, or complaints.     Lloyd Huger, MD   11/19/2016 10:41 AM

## 2016-11-18 ENCOUNTER — Inpatient Hospital Stay: Payer: 59 | Attending: Oncology | Admitting: Oncology

## 2016-11-18 VITALS — BP 134/77 | HR 54 | Temp 97.9°F | Resp 20 | Wt 193.2 lb

## 2016-11-18 DIAGNOSIS — C50412 Malignant neoplasm of upper-outer quadrant of left female breast: Secondary | ICD-10-CM | POA: Diagnosis not present

## 2016-11-18 DIAGNOSIS — C884 Extranodal marginal zone B-cell lymphoma of mucosa-associated lymphoid tissue [MALT-lymphoma]: Secondary | ICD-10-CM

## 2016-11-18 DIAGNOSIS — K219 Gastro-esophageal reflux disease without esophagitis: Secondary | ICD-10-CM

## 2016-11-18 DIAGNOSIS — I10 Essential (primary) hypertension: Secondary | ICD-10-CM | POA: Diagnosis not present

## 2016-11-18 DIAGNOSIS — N289 Disorder of kidney and ureter, unspecified: Secondary | ICD-10-CM | POA: Diagnosis not present

## 2016-11-18 DIAGNOSIS — G4730 Sleep apnea, unspecified: Secondary | ICD-10-CM | POA: Diagnosis not present

## 2016-11-18 DIAGNOSIS — Z923 Personal history of irradiation: Secondary | ICD-10-CM

## 2016-11-18 DIAGNOSIS — Z7902 Long term (current) use of antithrombotics/antiplatelets: Secondary | ICD-10-CM | POA: Diagnosis not present

## 2016-11-18 DIAGNOSIS — Z170 Estrogen receptor positive status [ER+]: Secondary | ICD-10-CM

## 2016-11-18 DIAGNOSIS — I2510 Atherosclerotic heart disease of native coronary artery without angina pectoris: Secondary | ICD-10-CM

## 2016-11-18 DIAGNOSIS — Z780 Asymptomatic menopausal state: Secondary | ICD-10-CM | POA: Diagnosis not present

## 2016-11-18 DIAGNOSIS — E039 Hypothyroidism, unspecified: Secondary | ICD-10-CM | POA: Diagnosis not present

## 2016-11-18 DIAGNOSIS — Z79899 Other long term (current) drug therapy: Secondary | ICD-10-CM

## 2016-11-18 DIAGNOSIS — Z7901 Long term (current) use of anticoagulants: Secondary | ICD-10-CM

## 2016-11-18 NOTE — Progress Notes (Signed)
Patient denies any concerns today.  

## 2016-12-01 DIAGNOSIS — H40052 Ocular hypertension, left eye: Secondary | ICD-10-CM | POA: Diagnosis not present

## 2016-12-01 DIAGNOSIS — H40042 Steroid responder, left eye: Secondary | ICD-10-CM | POA: Diagnosis not present

## 2016-12-09 ENCOUNTER — Encounter (INDEPENDENT_AMBULATORY_CARE_PROVIDER_SITE_OTHER): Payer: 59 | Admitting: Ophthalmology

## 2016-12-09 DIAGNOSIS — H34812 Central retinal vein occlusion, left eye, with macular edema: Secondary | ICD-10-CM

## 2016-12-09 DIAGNOSIS — I10 Essential (primary) hypertension: Secondary | ICD-10-CM | POA: Diagnosis not present

## 2016-12-09 DIAGNOSIS — H35033 Hypertensive retinopathy, bilateral: Secondary | ICD-10-CM

## 2016-12-09 DIAGNOSIS — H348312 Tributary (branch) retinal vein occlusion, right eye, stable: Secondary | ICD-10-CM | POA: Diagnosis not present

## 2016-12-09 DIAGNOSIS — H43813 Vitreous degeneration, bilateral: Secondary | ICD-10-CM | POA: Diagnosis not present

## 2017-01-07 DIAGNOSIS — Z23 Encounter for immunization: Secondary | ICD-10-CM | POA: Diagnosis not present

## 2017-01-19 DIAGNOSIS — E538 Deficiency of other specified B group vitamins: Secondary | ICD-10-CM | POA: Diagnosis not present

## 2017-01-19 DIAGNOSIS — E782 Mixed hyperlipidemia: Secondary | ICD-10-CM | POA: Diagnosis not present

## 2017-01-19 DIAGNOSIS — I480 Paroxysmal atrial fibrillation: Secondary | ICD-10-CM | POA: Diagnosis not present

## 2017-01-19 DIAGNOSIS — E559 Vitamin D deficiency, unspecified: Secondary | ICD-10-CM | POA: Diagnosis not present

## 2017-01-19 DIAGNOSIS — E039 Hypothyroidism, unspecified: Secondary | ICD-10-CM | POA: Diagnosis not present

## 2017-01-26 DIAGNOSIS — Z0000 Encounter for general adult medical examination without abnormal findings: Secondary | ICD-10-CM | POA: Diagnosis not present

## 2017-01-26 DIAGNOSIS — E538 Deficiency of other specified B group vitamins: Secondary | ICD-10-CM | POA: Diagnosis not present

## 2017-01-26 DIAGNOSIS — E782 Mixed hyperlipidemia: Secondary | ICD-10-CM | POA: Diagnosis not present

## 2017-02-03 ENCOUNTER — Encounter (INDEPENDENT_AMBULATORY_CARE_PROVIDER_SITE_OTHER): Payer: 59 | Admitting: Ophthalmology

## 2017-02-03 DIAGNOSIS — H348312 Tributary (branch) retinal vein occlusion, right eye, stable: Secondary | ICD-10-CM | POA: Diagnosis not present

## 2017-02-03 DIAGNOSIS — H43813 Vitreous degeneration, bilateral: Secondary | ICD-10-CM | POA: Diagnosis not present

## 2017-02-03 DIAGNOSIS — H338 Other retinal detachments: Secondary | ICD-10-CM

## 2017-02-03 DIAGNOSIS — H34812 Central retinal vein occlusion, left eye, with macular edema: Secondary | ICD-10-CM | POA: Diagnosis not present

## 2017-02-03 DIAGNOSIS — I10 Essential (primary) hypertension: Secondary | ICD-10-CM

## 2017-02-03 DIAGNOSIS — H35033 Hypertensive retinopathy, bilateral: Secondary | ICD-10-CM | POA: Diagnosis not present

## 2017-03-09 ENCOUNTER — Other Ambulatory Visit: Payer: Self-pay

## 2017-03-09 ENCOUNTER — Emergency Department
Admission: EM | Admit: 2017-03-09 | Discharge: 2017-03-10 | Disposition: A | Discharge status: Home / Self Care | Payer: 59 | Attending: Emergency Medicine | Admitting: Emergency Medicine

## 2017-03-09 DIAGNOSIS — J45909 Unspecified asthma, uncomplicated: Secondary | ICD-10-CM | POA: Diagnosis not present

## 2017-03-09 DIAGNOSIS — I2510 Atherosclerotic heart disease of native coronary artery without angina pectoris: Secondary | ICD-10-CM | POA: Diagnosis not present

## 2017-03-09 DIAGNOSIS — H40042 Steroid responder, left eye: Secondary | ICD-10-CM | POA: Diagnosis not present

## 2017-03-09 DIAGNOSIS — H40052 Ocular hypertension, left eye: Secondary | ICD-10-CM | POA: Diagnosis not present

## 2017-03-09 DIAGNOSIS — Z7902 Long term (current) use of antithrombotics/antiplatelets: Secondary | ICD-10-CM | POA: Diagnosis not present

## 2017-03-09 DIAGNOSIS — I10 Essential (primary) hypertension: Secondary | ICD-10-CM | POA: Diagnosis not present

## 2017-03-09 DIAGNOSIS — Z7901 Long term (current) use of anticoagulants: Secondary | ICD-10-CM | POA: Insufficient documentation

## 2017-03-09 DIAGNOSIS — Z79899 Other long term (current) drug therapy: Secondary | ICD-10-CM | POA: Insufficient documentation

## 2017-03-09 DIAGNOSIS — R040 Epistaxis: Secondary | ICD-10-CM | POA: Diagnosis not present

## 2017-03-09 DIAGNOSIS — E039 Hypothyroidism, unspecified: Secondary | ICD-10-CM | POA: Insufficient documentation

## 2017-03-09 NOTE — ED Triage Notes (Signed)
Pt comes from home via [facility] with c/o nose bleed. Per pt she picked/scratched inside her nose and her nose began to bleed. Bleeding has been ongoing for about 35-45 mins. Pt is on 2 blood thinners. Per EMS pt had about 500cc blood/clots in the sink and she has soaked 4 8x10s. EMS states they tried direct pressure and affrin with no luck. VS- Bp-144/90, HR-69, 98% room air. Pt is A&OX4, respirations even and unlabored.

## 2017-03-10 DIAGNOSIS — I10 Essential (primary) hypertension: Secondary | ICD-10-CM | POA: Diagnosis not present

## 2017-03-10 DIAGNOSIS — I2510 Atherosclerotic heart disease of native coronary artery without angina pectoris: Secondary | ICD-10-CM | POA: Diagnosis not present

## 2017-03-10 DIAGNOSIS — I480 Paroxysmal atrial fibrillation: Secondary | ICD-10-CM | POA: Diagnosis not present

## 2017-03-10 LAB — CBC
HCT: 39.4 % (ref 35.0–47.0)
HEMOGLOBIN: 12.9 g/dL (ref 12.0–16.0)
MCH: 25.5 pg — AB (ref 26.0–34.0)
MCHC: 32.8 g/dL (ref 32.0–36.0)
MCV: 78 fL — ABNORMAL LOW (ref 80.0–100.0)
Platelets: 242 10*3/uL (ref 150–440)
RBC: 5.05 MIL/uL (ref 3.80–5.20)
RDW: 16.2 % — ABNORMAL HIGH (ref 11.5–14.5)
WBC: 7.7 10*3/uL (ref 3.6–11.0)

## 2017-03-10 MED ORDER — TRANEXAMIC ACID 1000 MG/10ML IV SOLN
500.0000 mg | Freq: Once | INTRAVENOUS | Status: AC
  Administered 2017-03-10: 500 mg via TOPICAL
  Filled 2017-03-10: qty 10

## 2017-03-10 MED ORDER — AMOXICILLIN-POT CLAVULANATE 875-125 MG PO TABS: 1.0000 | ORAL_TABLET | Freq: Two times a day (BID) | ORAL | 0 refills | Status: AC

## 2017-03-10 MED ORDER — AMOXICILLIN-POT CLAVULANATE 875-125 MG PO TABS
1.0000 | ORAL_TABLET | Freq: Once | ORAL | Status: AC
Start: 2017-03-10 — End: 2017-03-10
  Administered 2017-03-10: 1 via ORAL
  Filled 2017-03-10: qty 1

## 2017-03-10 NOTE — ED Provider Notes (Signed)
[facility] Emergency Department Provider Note   ____________________________________________   First MD Initiated Contact with Patient 03/09/17 2328     (approximate)  I have reviewed the triage vital signs and the nursing notes.   HISTORY  Chief Complaint Epistaxis    HPI Stacey Stone is a 71 y.o. female who comes into the hospital today with a nosebleed.  The patient reports that it will not stop.  Is been going on for about 45 minutes.  She states that her nose H and she scratched it and then the right nostril started bleeding.  The patient takes Plavix and Eliquis.  She denies any lightheadedness or dizziness.  She contacted EMS to bring her in.  The patient has never had a nosebleed like this in the past.  She did receive some Afrin in the ambulance.  Past Medical History:  Diagnosis Date  . Anginal pain ([place])   . Arthritis t   knee  . Asthma    mild  . 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 Mamie Nick. at [facility] in [place]  . Depression   . Dyspnea   . Dysrhythmia   . GERD (gastroesophageal reflux disease)   . Hypertension   . Hypothyroidism   . 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  Marland Kitchen No pertinent past medical history   . Sleep apnea t   O2- 2l at bedtime and BiPap @ bedtime    Patient Active Problem List   Diagnosis Date Noted  . PMB (postmenopausal bleeding) 06/17/2016  . Preoperative cardiovascular examination 05/13/2016  . Primary cancer of upper outer quadrant of left female breast ([place]) 05/09/2016  . Unstable angina ([place]) 09/24/2015  . S/P cardiac catheterization 09/24/2015  . History of total right knee replacement 06/25/2015  . B12 deficiency 01/15/2015  . Vitamin D deficiency 01/15/2015  . PAF (paroxysmal atrial fibrillation) ([place]) 12/15/2014  . Chest pain 12/15/2014  . Aortic valve disorder 11/06/2014  . Hypersomnia with  sleep apnea 11/06/2014  . 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 OSA on CPAP 01/02/2014  . Angina at rest [facility]) 06/16/2013  . HTN (hypertension) 06/16/2013  . Hypercholesterolemia 06/16/2013  . Hypothyroidism 06/16/2013  . 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 CAD S/P percutaneous coronary angioplasty 01/08/2006    Past Surgical History:  Procedure Laterality Date  . BREAST BIOPSY Left 2/136/2018   path pending  . BREAST EXCISIONAL BIOPSY Left 06/04/2016   2 area NL prior to lumpectomy +  . CARDIAC CATHETERIZATION    . CARDIAC CATHETERIZATION N/A 09/24/2015   Procedure: Left Heart Cath and Coronary Angiography;  Surgeon: Isaias Cowman, MD;  Location: [place] [facility];  Service: Cardiovascular;  Laterality: N/A;  . CARDIAC CATHETERIZATION N/A 09/24/2015   Procedure: Coronary Stent Intervention;  Surgeon: Isaias Cowman, MD;  Location: [place] [facility];  Service: Cardiovascular;  Laterality: N/A;  . CHOLECYSTECTOMY  11/26   08/1970  . DILATATION & CURETTAGE/HYSTEROSCOPY WITH MYOSURE N/A 05/05/2015   Procedure: Hysteroscopy and endometrial curretage;  Surgeon: Boykin Nearing, MD;  Location: [facility];  Service: Gynecology;  Laterality: N/A;  . DILATION AND CURETTAGE OF UTERUS    . EXCISION MASS FROM NECK  Right    Dr. Tami Ribas  . EYE SURGERY  t   bil cataract 9/08  . HAMMER TOE SURGERY Right   . JOINT REPLACEMENT Bilateral 2008, 11/26/ 2012   Partial  Knee Replacements  . PARTIAL MASTECTOMY WITH NEEDLE LOCALIZATION Left 06/04/2016   Procedure: PARTIAL MASTECTOMY WITH NEEDLE LOCALIZATION;  Surgeon: Leonie Green, MD;  Location: [facility];  Service: General;  Laterality: Left;  . SCLERAL BUCKLE  02/16/2011   Procedure: SCLERAL BUCKLE;  Surgeon: Hayden Pedro, MD;  Location: [place];  Service: Ophthalmology;  Laterality: Left;  Scleral Buckle Left Eye with Headscope Laser  . SENTINEL NODE BIOPSY Left 06/04/2016   Procedure: SENTINEL NODE BIOPSY;   Surgeon: Leonie Green, MD;  Location: [facility];  Service: General;  Laterality: Left;    Prior to Admission medications   Medication Sig Start Date End Date Taking? Authorizing Provider  amoxicillin-clavulanate (AUGMENTIN) 875-125 MG tablet Take 1 tablet by mouth 2 (two) times daily for 5 days. 03/10/17 03/15/17  Loney Hering, MD  apixaban (ELIQUIS) 5 MG TABS tablet Take 5 mg by mouth 2 (two) times daily.    [provider]  Besifloxacin HCl (BESIVANCE) 0.6 % SUSP Place 1 drop into the left eye See admin instructions. AFTER EYE INJECTION    [provider]  brimonidine (ALPHAGAN) 0.2 % ophthalmic solution Place 1 drop into the left eye 2 (two) times daily.    [provider]  Cholecalciferol (VITAMIN D-1000 MAX ST) 1000 units tablet Take 1,000 Units by mouth daily.    [provider]  clopidogrel (PLAVIX) 75 MG tablet Take 1 tablet (75 mg total) by mouth daily with breakfast. 09/25/15   Paraschos, Alexander, MD  diltiazem (CARDIZEM CD) 300 MG 24 hr capsule Take 300 mg by mouth daily.    [provider]  Fluticasone-Salmeterol (ADVAIR) 100-50 MCG/DOSE AEPB Inhale 1 puff into the lungs 2 (two) times daily as needed (for asthma.).    [provider]  isosorbide mononitrate (IMDUR) 30 MG 24 hr tablet Take 30 mg by mouth daily.    [provider]  letrozole (FEMARA) 2.5 MG tablet Take 1 tablet (2.5 mg total) by mouth daily. 09/28/16   Lloyd Huger, MD  levothyroxine (SYNTHROID, LEVOTHROID) 150 MCG tablet Take 150 mcg by mouth daily.      [provider]  Melatonin 5 MG TABS Take 5 mg by mouth at bedtime.    [provider]  metoprolol (TOPROL-XL) 100 MG 24 hr tablet Take 50 mg by mouth 2 (two) times daily.     [provider]  nitroGLYCERIN (NITROSTAT) 0.4 MG SL tablet Place 0.4 mg under the tongue every 5 (five) minutes as needed for chest pain. Maximum 3 doses, If no relief call md or 911.     [provider]  oxybutynin (DITROPAN) 5 MG tablet Take 5 mg by mouth daily.    [provider]  potassium chloride (K-DUR) 10 MEQ tablet Take 1 tablet (10 mEq total) by mouth daily. 09/25/15   Paraschos, Alexander, MD  RABEprazole (ACIPHEX) 20 MG tablet Take 20 mg by mouth daily.      [provider]  sertraline (ZOLOFT) 50 MG tablet Take 50 mg by mouth at bedtime.      [provider]  simvastatin (ZOCOR) 40 MG tablet Take 40 mg by mouth at bedtime.      [provider]  torsemide (DEMADEX) 20 MG tablet Take 20 mg by mouth daily as needed.     [provider]  vitamin B-12 (CYANOCOBALAMIN) 100 MCG tablet Take 100 mcg by mouth daily.    [provider]    Allergies Codeine  Family  History  Problem Relation Age of Onset  . Breast cancer Mother 64  . Heart disease Father   . Breast cancer Paternal Aunt 73  . Breast cancer Cousin   . Anesthesia problems Neg Hx   . Hypotension Neg Hx   . Malignant hyperthermia Neg Hx   . Pseudochol deficiency Neg Hx     Social History Social History   Tobacco Use  . Smoking status: Never Smoker  . Smokeless tobacco: Never Used  Substance Use Topics  . Alcohol use: No  . Drug use: No    Review of Systems  Constitutional: No fever/chills Eyes: No visual changes. ENT: nose bleed Cardiovascular: Denies chest pain. Respiratory: Denies shortness of breath. Gastrointestinal: No abdominal pain.  No nausea, no vomiting.  No diarrhea.  No constipation. Genitourinary: Negative for dysuria. Musculoskeletal: Negative for back pain. Skin: Negative for rash. Neurological: Negative for headaches, focal weakness or numbness.   ____________________________________________   PHYSICAL EXAM:  VITAL SIGNS: ED Triage Vitals  Enc Vitals Group     BP 03/09/17 2351 135/82     Pulse Rate 03/09/17 2351 60     Resp 03/09/17 2351 19     Temp 03/09/17 2351 97.8 F (36.6 C)     Temp Source  03/09/17 2351 Oral     SpO2 03/09/17 2351 97 %     Weight 03/09/17 2330 189 lb (85.7 kg)     Height 03/09/17 2330 5\' 3"  (1.6 m)     Head Circumference --      Peak Flow --      Pain Score --      Pain Loc --      Pain Edu? --      Excl. in [place]? --     Constitutional: Alert and oriented. Well appearing and in moderate distress. Eyes: Conjunctivae are normal. PERRL. EOMI. Head: Atraumatic. Nose: Bleeding from bilateral nostrils right greater than left, unable to identify source Mouth/Throat: Mucous membranes are moist.  Oropharynx non-erythematous. Cardiovascular: Normal rate, regular rhythm. Grossly normal heart sounds.  Good peripheral circulation. Respiratory: Normal respiratory effort.  No retractions. Lungs CTAB. Gastrointestinal: Soft and nontender. No distention.  Musculoskeletal: No lower extremity tenderness nor edema.   Neurologic:  Normal speech and language.  Skin:  Skin is warm, dry and intact.  Psychiatric: Mood and affect are normal.   ____________________________________________   LABS (all labs ordered are listed, but only abnormal results are displayed)  Labs Reviewed  CBC - Abnormal; Notable for the following components:      Result Value   MCV 78.0 (*)    MCH 25.5 (*)    RDW 16.2 (*)    All other components within normal limits   ____________________________________________  EKG  ED ECG REPORT I, Loney Hering, the attending physician, personally viewed and interpreted this ECG.   Date: 03/10/2017  EKG Time: 203  Rate: 64  Rhythm: normal sinus rhythm  Axis: normal  Intervals:none  ST&T Change: none  ____________________________________________  RADIOLOGY  No results found.  ____________________________________________   PROCEDURES  Procedure(s) performed: please, see procedure note(s).  .Epistaxis Management Date/Time: 03/10/2017 11:28 PM Performed by: Loney Hering, MD Authorized by: Loney Hering, MD   Consent:      Consent obtained:  Verbal   Consent given by:  Patient Procedure details:    Treatment site:  Unable to specify   Treatment method:  Merocel sponge   Treatment episode: initial   Post-procedure details:  Assessment:  Bleeding decreased   Patient tolerance of procedure:  Tolerated well, no immediate complications    Critical Care performed: No  ____________________________________________   INITIAL IMPRESSION / ASSESSMENT AND PLAN / ED COURSE  As part of my medical decision making, I reviewed the following data within the electronic MEDICAL RECORD NUMBER Notes from prior ED visits and [place] Controlled Substance Database   This is a 71 year old female who comes into the hospital today with epistaxis.  The patient takes blood thinners at home.  This is likely the cause of her nosebleed.  When I initially went into the room I decided to pack the patient's nostrils.  I placed a Merocel sponge into the patient's right nostril and sprayed some Afrin into her bilateral nostrils.  The patient had some bleeding on the left as well as the right but the left did slow down after the right side was packed.  I checked a CBC which was unremarkable.  We monitored the patient in the emergency department.  She did cough up a little bit of blood and she also continually dab at the left side stating that there was some mild blood coming out.  When he went back into check on the patient she stated then that she had some palpitation feelings in her chest.  She does have a history of A. fib but just wanted to get it checked out.  We will do an EKG as well.  I will also order some TXA to place on a pledget and put it inside of the patient's left nostril.  She states that she does not want packing on the left side.  His EKG was unremarkable.  While were in the room we saw that the patient had a few episodes of paroxysmal A. fib which is why she was having the palpitations.  I put a TXA soaked pledget in the patient's  left nostril.  She will be discharged home to follow-up.      ____________________________________________   FINAL CLINICAL IMPRESSION(S) / ED DIAGNOSES  Final diagnoses:  Epistaxis     ED Discharge Orders        Ordered    amoxicillin-clavulanate (AUGMENTIN) 875-125 MG tablet  2 times daily     03/10/17 0203       Note:  This document was prepared using Dragon voice recognition software and may include unintentional dictation errors.    Loney Hering, MD 03/10/17 [phone]

## 2017-03-10 NOTE — Discharge Instructions (Signed)
Take the antibiotics for prophylaxis and please follow-up with ENT in 5 days to have the packing removed.

## 2017-03-14 DIAGNOSIS — R040 Epistaxis: Secondary | ICD-10-CM | POA: Diagnosis not present

## 2017-03-31 ENCOUNTER — Encounter (INDEPENDENT_AMBULATORY_CARE_PROVIDER_SITE_OTHER): Payer: 59 | Admitting: Ophthalmology

## 2017-03-31 DIAGNOSIS — H348312 Tributary (branch) retinal vein occlusion, right eye, stable: Secondary | ICD-10-CM

## 2017-03-31 DIAGNOSIS — H338 Other retinal detachments: Secondary | ICD-10-CM

## 2017-03-31 DIAGNOSIS — H35033 Hypertensive retinopathy, bilateral: Secondary | ICD-10-CM

## 2017-03-31 DIAGNOSIS — H34812 Central retinal vein occlusion, left eye, with macular edema: Secondary | ICD-10-CM

## 2017-03-31 DIAGNOSIS — I10 Essential (primary) hypertension: Secondary | ICD-10-CM

## 2017-03-31 DIAGNOSIS — H43813 Vitreous degeneration, bilateral: Secondary | ICD-10-CM

## 2017-04-04 ENCOUNTER — Other Ambulatory Visit: Payer: Self-pay | Admitting: Surgery

## 2017-04-04 DIAGNOSIS — Z853 Personal history of malignant neoplasm of breast: Secondary | ICD-10-CM

## 2017-04-06 ENCOUNTER — Encounter: Payer: Self-pay | Admitting: Radiation Oncology

## 2017-04-06 ENCOUNTER — Ambulatory Visit
Admission: RE | Admit: 2017-04-06 | Discharge: 2017-04-06 | Disposition: A | Discharge status: Home / Self Care | Payer: 59 | Source: Ambulatory Visit | Attending: Radiation Oncology | Admitting: Radiation Oncology

## 2017-04-06 ENCOUNTER — Other Ambulatory Visit: Payer: Self-pay

## 2017-04-06 VITALS — BP 118/72 | HR 58 | Temp 97.2°F | Resp 16 | Wt 191.2 lb

## 2017-04-06 DIAGNOSIS — Z170 Estrogen receptor positive status [ER+]: Secondary | ICD-10-CM | POA: Diagnosis not present

## 2017-04-06 DIAGNOSIS — C50412 Malignant neoplasm of upper-outer quadrant of left female breast: Secondary | ICD-10-CM

## 2017-04-06 NOTE — Progress Notes (Signed)
Radiation Oncology Follow up Note  Name: Stacey Stone   Date:   04/06/2017 MRN:  [id number] DOB: Dec 01, 1945    This 72 y.o. 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  REFERRING PROVIDER: Rusty Aus, MD  HPI: Patient is a 72 year old female now seen out 6 months having completed whole breast radiation to her left breast for ER positive invasive mammary carcinoma.. She seen today in routine follow-up and is doing well. She specifically denies breast tenderness cough or bone pain. She is scheduled for follow-up mammograms next month. She's currently on Femara tolerating that well without side effect.  COMPLICATIONS OF TREATMENT: none  FOLLOW UP COMPLIANCE: keeps appointments   PHYSICAL EXAM:  BP 118/72   Pulse (!) 58   Temp (!) 97.2 F (36.2 C)   Resp 16   Wt 191 lb 4 oz (86.7 kg)   BMI 33.88 kg/m  Lungs are clear to A&P cardiac examination essentially unremarkable with regular rate and rhythm. No dominant mass or nodularity is noted in either breast in 2 positions examined. Incision is well-healed. No axillary or supraclavicular adenopathy is appreciated. Cosmetic result is excellent. Well-developed well-nourished patient in NAD. HEENT reveals PERLA, EOMI, discs not visualized.  Oral cavity is clear. No oral mucosal lesions are identified. Neck is clear without evidence of cervical or supraclavicular adenopathy. Lungs are clear to A&P. Cardiac examination is essentially unremarkable with regular rate and rhythm without murmur rub or thrill. Abdomen is benign with no organomegaly or masses noted. Motor sensory and DTR levels are equal and symmetric in the upper and lower extremities. Cranial nerves II through XII are grossly intact. Proprioception is intact. No peripheral adenopathy or edema is identified. No motor or sensory levels are noted. Crude visual  fields are within normal range.  RADIOLOGY RESULTS: Will review her mammograms to be performed in February  PLAN: Present time she is doing well with no evidence of disease. I'm please were overall progress. She or he has follow-up mammogram scheduled for next month. She continues on Femara without side effect. I have asked to see her back in 6 months for follow-up. Patient is to call sooner with any concerns.  I would like to take this opportunity to thank you for allowing me to participate in the care of your patient.Noreene Filbert, MD

## 2017-04-24 ENCOUNTER — Emergency Department
Admission: EM | Admit: 2017-04-24 | Discharge: 2017-04-24 | Disposition: A | Discharge status: Home / Self Care | Payer: Commercial Managed Care - HMO | Attending: Emergency Medicine | Admitting: Emergency Medicine

## 2017-04-24 ENCOUNTER — Emergency Department: Payer: Commercial Managed Care - HMO

## 2017-04-24 ENCOUNTER — Encounter: Payer: Self-pay | Admitting: Emergency Medicine

## 2017-04-24 ENCOUNTER — Other Ambulatory Visit: Payer: Self-pay

## 2017-04-24 DIAGNOSIS — Y999 Unspecified external cause status: Secondary | ICD-10-CM | POA: Diagnosis not present

## 2017-04-24 DIAGNOSIS — I259 Chronic ischemic heart disease, unspecified: Secondary | ICD-10-CM | POA: Diagnosis not present

## 2017-04-24 DIAGNOSIS — Y9389 Activity, other specified: Secondary | ICD-10-CM | POA: Insufficient documentation

## 2017-04-24 DIAGNOSIS — Z79899 Other long term (current) drug therapy: Secondary | ICD-10-CM | POA: Diagnosis not present

## 2017-04-24 DIAGNOSIS — Y9289 Other specified places as the place of occurrence of the external cause: Secondary | ICD-10-CM | POA: Diagnosis not present

## 2017-04-24 DIAGNOSIS — E039 Hypothyroidism, unspecified: Secondary | ICD-10-CM | POA: Diagnosis not present

## 2017-04-24 DIAGNOSIS — S0033XA Contusion of nose, initial encounter: Secondary | ICD-10-CM | POA: Diagnosis not present

## 2017-04-24 DIAGNOSIS — S8001XA Contusion of right knee, initial encounter: Secondary | ICD-10-CM | POA: Insufficient documentation

## 2017-04-24 DIAGNOSIS — S0990XA Unspecified injury of head, initial encounter: Secondary | ICD-10-CM

## 2017-04-24 DIAGNOSIS — J45909 Unspecified asthma, uncomplicated: Secondary | ICD-10-CM | POA: Insufficient documentation

## 2017-04-24 DIAGNOSIS — Z7982 Long term (current) use of aspirin: Secondary | ICD-10-CM | POA: Insufficient documentation

## 2017-04-24 DIAGNOSIS — S8991XA Unspecified injury of right lower leg, initial encounter: Secondary | ICD-10-CM | POA: Insufficient documentation

## 2017-04-24 DIAGNOSIS — Z7901 Long term (current) use of anticoagulants: Secondary | ICD-10-CM | POA: Diagnosis not present

## 2017-04-24 DIAGNOSIS — T07XXXA Unspecified multiple injuries, initial encounter: Secondary | ICD-10-CM

## 2017-04-24 DIAGNOSIS — I10 Essential (primary) hypertension: Secondary | ICD-10-CM | POA: Diagnosis not present

## 2017-04-24 DIAGNOSIS — R040 Epistaxis: Secondary | ICD-10-CM

## 2017-04-24 DIAGNOSIS — W19XXXA Unspecified fall, initial encounter: Secondary | ICD-10-CM

## 2017-04-24 DIAGNOSIS — W1830XA Fall on same level, unspecified, initial encounter: Secondary | ICD-10-CM | POA: Diagnosis not present

## 2017-04-24 DIAGNOSIS — S0993XA Unspecified injury of face, initial encounter: Secondary | ICD-10-CM | POA: Diagnosis not present

## 2017-04-24 LAB — CBC WITH DIFFERENTIAL/PLATELET
Basophils Absolute: 0.1 10*3/uL (ref 0–0.1)
Basophils Relative: 1 %
EOS ABS: 0.1 10*3/uL (ref 0–0.7)
Eosinophils Relative: 2 %
HEMATOCRIT: 33.3 % — AB (ref 35.0–47.0)
Hemoglobin: 10.9 g/dL — ABNORMAL LOW (ref 12.0–16.0)
LYMPHS ABS: 1.5 10*3/uL (ref 1.0–3.6)
Lymphocytes Relative: 20 %
MCH: 24.5 pg — AB (ref 26.0–34.0)
MCHC: 32.7 g/dL (ref 32.0–36.0)
MCV: 74.8 fL — ABNORMAL LOW (ref 80.0–100.0)
MONOS PCT: 7 %
Monocytes Absolute: 0.6 10*3/uL (ref 0.2–0.9)
NEUTROS PCT: 70 %
Neutro Abs: 5.3 10*3/uL (ref 1.4–6.5)
Platelets: 254 10*3/uL (ref 150–440)
RBC: 4.45 MIL/uL (ref 3.80–5.20)
RDW: 16.3 % — ABNORMAL HIGH (ref 11.5–14.5)
WBC: 7.6 10*3/uL (ref 3.6–11.0)

## 2017-04-24 LAB — COMPREHENSIVE METABOLIC PANEL
ALT: 20 U/L (ref 14–54)
AST: 31 U/L (ref 15–41)
Albumin: 3.5 g/dL (ref 3.5–5.0)
Alkaline Phosphatase: 138 U/L — ABNORMAL HIGH (ref 38–126)
Anion gap: 8 (ref 5–15)
BUN: 14 mg/dL (ref 6–20)
CHLORIDE: 106 mmol/L (ref 101–111)
CO2: 22 mmol/L (ref 22–32)
CREATININE: 1.02 mg/dL — AB (ref 0.44–1.00)
Calcium: 9 mg/dL (ref 8.9–10.3)
GFR, EST NON AFRICAN AMERICAN: 53 mL/min — AB (ref >60)
Glucose, Bld: 111 mg/dL — ABNORMAL HIGH (ref 65–99)
POTASSIUM: 3.6 mmol/L (ref 3.5–5.1)
SODIUM: 136 mmol/L (ref 135–145)
Total Bilirubin: 0.5 mg/dL (ref 0.3–1.2)
Total Protein: 7.4 g/dL (ref 6.5–8.1)

## 2017-04-24 LAB — PROTIME-INR
INR: 1.07
PROTHROMBIN TIME: 13.8 s (ref 11.4–15.2)

## 2017-04-24 MED ORDER — SILVER NITRATE-POT NITRATE 75-25 % EX MISC
CUTANEOUS | Status: AC
  Administered 2017-04-24: 2 via TOPICAL
  Filled 2017-04-24: qty 1

## 2017-04-24 MED ORDER — SILVER NITRATE-POT NITRATE 75-25 % EX MISC
2.0000 | Freq: Once | CUTANEOUS | Status: AC
  Administered 2017-04-24: 2 via TOPICAL

## 2017-04-24 MED ORDER — TRANEXAMIC ACID 1000 MG/10ML IV SOLN
500.0000 mg | Freq: Once | INTRAVENOUS | Status: AC
  Administered 2017-04-24: 500 mg via TOPICAL
  Filled 2017-04-24: qty 10

## 2017-04-24 NOTE — ED Notes (Signed)
Pt alert, family at bedside.  Bruising noted to patient's L eye and pt reports being on Eliquis.  Pt states that her nose dripped blood all throughout night despite using several different materials to try to stop bleeding.  Pt aware of reasons blood is being drawn and IV was started.  Pt has no questions or concerns at this time.

## 2017-04-24 NOTE — ED Triage Notes (Signed)
Pt states fell last night and hit her face. Pt c/o continued nosebleed since last night. Pt presents with bruising to her face. States takes Eliquis and 81mg  ASA daily. Pt with slight dripping from R nare at this time.

## 2017-04-24 NOTE — ED Provider Notes (Signed)
[facility] Emergency Department Provider Note  ____________________________________________   I have reviewed the triage vital signs and the nursing notes. Where available I have reviewed prior notes and, if possible and indicated, outside hospital notes.    HISTORY  Chief Complaint Fall and Epistaxis    HPI Stacey Stone is a 72 y.o. female who is on Eliquis for paroxysmal atrial fibrillation presents today after a non-syncopal fall.  Patient was leaning over to pick up her dog and lost her balance landing on her right knee and bumping her face.  She did not pass out, she would not be here except for the fact that she has had intermittent nosebleeds since she hit the ground.  She has been placing her on cotton balls in there and it will stop it when she takes out the packing she will have a few drops of blood.  She denies any lightheadedness, she denies any stiff neck, she denies any chest pain palpitations, this was a non-syncopal event.  She clearly remembers that, she just lost her balance.  She is able to ambulate on the bruised knee.  The knee has been replaced in the past. Not bleeding at this time.   Past Medical History:  Diagnosis Date  . Anginal pain ([place])   . Arthritis t   knee  . Asthma    mild  . 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 Mamie Nick. at [facility] in [place]  . Depression   . Dyspnea   . Dysrhythmia   . GERD (gastroesophageal reflux disease)   . Hypertension   . Hypothyroidism   . 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  Marland Kitchen No pertinent past medical history   . Sleep apnea t   O2- 2l at bedtime and BiPap @ bedtime    Patient Active Problem List   Diagnosis Date Noted  . PMB (postmenopausal bleeding) 06/17/2016  . Preoperative cardiovascular examination 05/13/2016  . Primary cancer of upper outer quadrant of left female breast ([place])  05/09/2016  . Unstable angina ([place]) 09/24/2015  . S/P cardiac catheterization 09/24/2015  . History of total right knee replacement 06/25/2015  . B12 deficiency 01/15/2015  . Vitamin D deficiency 01/15/2015  . PAF (paroxysmal atrial fibrillation) ([place]) 12/15/2014  . Chest pain 12/15/2014  . Aortic valve disorder 11/06/2014  . 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 OSA on CPAP 01/02/2014  . Angina at rest [facility]) 06/16/2013  . HTN (hypertension) 06/16/2013  . Hypercholesterolemia 06/16/2013  . Hypothyroidism 06/16/2013  . 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 CAD S/P percutaneous coronary angioplasty 01/08/2006    Past Surgical History:  Procedure Laterality Date  . BREAST BIOPSY Left 2/136/2018   path pending  . BREAST EXCISIONAL BIOPSY Left 06/04/2016   2 area NL prior to lumpectomy +  . CARDIAC CATHETERIZATION    . CARDIAC CATHETERIZATION N/A 09/24/2015   Procedure: Left Heart Cath and Coronary Angiography;  Surgeon: Isaias Cowman, MD;  Location: [place] [facility];  Service: Cardiovascular;  Laterality: N/A;  . CARDIAC CATHETERIZATION N/A 09/24/2015   Procedure: Coronary Stent Intervention;  Surgeon: Isaias Cowman, MD;  Location: [place] [facility];  Service: Cardiovascular;  Laterality: N/A;  . CHOLECYSTECTOMY  11/26   08/1970  . DILATATION & CURETTAGE/HYSTEROSCOPY WITH MYOSURE N/A 05/05/2015   Procedure: Hysteroscopy and endometrial curretage;  Surgeon: Gwen Her Schermerhorn,  MD;  Location: [facility];  Service: Gynecology;  Laterality: N/A;  . DILATION AND CURETTAGE OF UTERUS    . EXCISION MASS FROM NECK  Right    Dr. Tami Ribas  . EYE SURGERY  t   bil cataract 9/08  . HAMMER TOE SURGERY Right   . JOINT REPLACEMENT Bilateral 2008, 11/26/ 2012   Partial Knee Replacements  . PARTIAL MASTECTOMY WITH NEEDLE LOCALIZATION Left 06/04/2016   Procedure: PARTIAL MASTECTOMY WITH NEEDLE LOCALIZATION;  Surgeon: Leonie Green, MD;  Location: [facility];  Service: General;  Laterality: Left;  . SCLERAL BUCKLE  02/16/2011   Procedure: SCLERAL BUCKLE;  Surgeon: Hayden Pedro, MD;  Location: [place];  Service: Ophthalmology;  Laterality: Left;  Scleral Buckle Left Eye with Headscope Laser  . SENTINEL NODE BIOPSY Left 06/04/2016   Procedure: SENTINEL NODE BIOPSY;  Surgeon: Leonie Green, MD;  Location: [facility];  Service: General;  Laterality: Left;    Prior to Admission medications   Medication Sig Start Date End Date Taking? Authorizing Provider  Acetaminophen (MAPAP) 500 MG coapsule Take by mouth.    [provider]  apixaban (ELIQUIS) 5 MG TABS tablet Take 5 mg by mouth 2 (two) times daily.    [provider]  aspirin 81 MG chewable tablet Chew 81 mg by mouth daily.    [provider]  Besifloxacin HCl (BESIVANCE) 0.6 % SUSP Place 1 drop into the left eye See admin instructions. AFTER EYE INJECTION    [provider]  brimonidine (ALPHAGAN) 0.2 % ophthalmic solution Place 1 drop into the left eye 2 (two) times daily.    [provider]  Cholecalciferol (VITAMIN D-1000 MAX ST) 1000 units tablet Take 1,000 Units by mouth daily.    [provider]  diltiazem (CARDIZEM CD) 300 MG 24 hr capsule Take 300 mg by mouth daily.    [provider]  FLUARIX QUADRIVALENT 0.5 ML injection ADM 0.5ML IM UTD 01/07/17   [provider]  Fluticasone-Salmeterol (ADVAIR) 100-50 MCG/DOSE AEPB Inhale 1 puff into the lungs 2 (two) times daily as needed (for asthma.).    [provider]  isosorbide mononitrate (IMDUR) 30 MG 24 hr tablet Take 30 mg by mouth daily.    [provider]  letrozole (FEMARA) 2.5 MG tablet Take 1 tablet (2.5 mg total) by mouth daily. 09/28/16   Lloyd Huger, MD  levothyroxine (SYNTHROID, LEVOTHROID) 150 MCG tablet Take 150 mcg by mouth daily.      [provider]  Melatonin 5 MG TABS Take 5 mg by  mouth at bedtime.    [provider]  metoprolol (TOPROL-XL) 100 MG 24 hr tablet Take 50 mg by mouth 2 (two) times daily.     [provider]  nitroGLYCERIN (NITROSTAT) 0.4 MG SL tablet Place 0.4 mg under the tongue every 5 (five) minutes as needed for chest pain. Maximum 3 doses, If no relief call md or 911.    [provider]  oxybutynin (DITROPAN) 5 MG tablet Take 5 mg by mouth daily.    [provider]  potassium chloride (K-DUR) 10 MEQ tablet Take 1 tablet (10 mEq total) by mouth daily. 09/25/15   Paraschos, Alexander, MD  RABEprazole (ACIPHEX) 20 MG tablet Take 20 mg by mouth daily.      [provider]  sertraline (ZOLOFT) 50 MG tablet Take 50 mg by mouth at bedtime.      [provider]  simvastatin (ZOCOR) 40 MG  tablet Take 40 mg by mouth at bedtime.      [provider]  torsemide (DEMADEX) 20 MG tablet Take 20 mg by mouth daily as needed.     [provider]  vitamin B-12 (CYANOCOBALAMIN) 100 MCG tablet Take 100 mcg by mouth daily.    [provider]    Allergies Codeine  Family History  Problem Relation Age of Onset  . Breast cancer Mother 10  . Heart disease Father   . Breast cancer Paternal Aunt 73  . Breast cancer Cousin   . Anesthesia problems Neg Hx   . Hypotension Neg Hx   . Malignant hyperthermia Neg Hx   . Pseudochol deficiency Neg Hx     Social History Social History   Tobacco Use  . Smoking status: Never Smoker  . Smokeless tobacco: Never Used  Substance Use Topics  . Alcohol use: No  . Drug use: No    Review of Systems Constitutional: No fever/chills Eyes: No visual changes. ENT: No sore throat. No stiff neck no neck pain Cardiovascular: Denies chest pain. Respiratory: Denies shortness of breath. Gastrointestinal:   no vomiting.  No diarrhea.  No constipation. Genitourinary: Negative for dysuria. Musculoskeletal: Negative lower extremity swelling Skin: Negative for  rash. Neurological: Negative for severe headaches, focal weakness or numbness.   ____________________________________________   PHYSICAL EXAM:  VITAL SIGNS: ED Triage Vitals [04/24/17 1435]  Enc Vitals Group     BP 132/83     Pulse Rate (!) 58     Resp 18     Temp 97.9 F (36.6 C)     Temp Source Oral     SpO2 98 %     Weight 189 lb (85.7 kg)     Height 5\' 3"  (1.6 m)     Head Circumference      Peak Flow      Pain Score 4     Pain Loc      Pain Edu?      Excl. in [place]?     Constitutional: Alert and oriented. Well appearing and in no acute distress. Eyes: Conjunctivae are normal Head: Bruising noted around the right eye as well as to the bridge of the nose. HEENT: There is, no active bleeding but there is blood noted on the septum.  Septal hematoma not noted. Mucous membranes are moist.  Oropharynx non-erythematous Neck:   Nontender with no meningismus, no masses, no stridor Cardiovascular: Normal rate, regular rhythm. Grossly normal heart sounds.  Good peripheral circulation. Respiratory: Normal respiratory effort.  No retractions. Lungs CTAB. Abdominal: Soft and nontender. No distention. No guarding no rebound Back:  There is no focal tenderness or step off.  there is no midline tenderness there are no lesions noted. there is no CVA tenderness Musculoskeletal: Superficial bruise to the right knee full range of motion no effusion, no malalignment or deformity, no upper extremity tenderness. No joint effusions, no DVT signs strong distal pulses no edema Neurologic:  Normal speech and language. No gross focal neurologic deficits are appreciated.  Skin:  Skin is warm, dry and intact. No rash noted. Psychiatric: Mood and affect are normal. Speech and behavior are normal.  ____________________________________________   LABS (all labs ordered are listed, but only abnormal results are displayed)  Labs Reviewed  COMPREHENSIVE METABOLIC PANEL - Abnormal; Notable for the  following components:      Result Value   Glucose, Bld 111 (*)    Creatinine, Ser 1.02 (*)    Alkaline  Phosphatase 138 (*)    GFR calc non Af Amer 53 (*)    All other components within normal limits  CBC WITH DIFFERENTIAL/PLATELET - Abnormal; Notable for the following components:   Hemoglobin 10.9 (*)    HCT 33.3 (*)    MCV 74.8 (*)    MCH 24.5 (*)    RDW 16.3 (*)    All other components within normal limits  PROTIME-INR    Pertinent labs  results that were available during my care of the patient were reviewed by me and considered in my medical decision making (see chart for details). ____________________________________________  EKG  I personally interpreted any EKGs ordered by me or triage  ____________________________________________  RADIOLOGY  Pertinent labs & imaging results that were available during my care of the patient were reviewed by me and considered in my medical decision making (see chart for details). If possible, patient and/or family made aware of any abnormal findings.  Ct Head Wo Contrast  Result Date: 04/24/2017 CLINICAL DATA:  Status post fall last night. Right-sided facial bruising. EXAM: CT HEAD WITHOUT CONTRAST CT MAXILLOFACIAL WITHOUT CONTRAST TECHNIQUE: Multidetector CT imaging of the head and maxillofacial structures were performed using the standard protocol without intravenous contrast. Multiplanar CT image reconstructions of the maxillofacial structures were also generated. COMPARISON:  None. FINDINGS: CT HEAD FINDINGS Brain: No evidence of acute infarction, hemorrhage, extra-axial collection, ventriculomegaly, or mass effect. Generalized cerebral atrophy. Periventricular white matter low attenuation likely secondary to microangiopathy. Vascular: Cerebrovascular atherosclerotic calcifications are noted. Skull: Negative for fracture or focal lesion. Other: None. CT MAXILLOFACIAL FINDINGS Osseous: No fracture or mandibular dislocation. No destructive  process. 3 mm anterolisthesis of C3 on C4 secondary to bilateral moderate facet arthropathy. Degenerative disc disease with disc height loss at C5-6. Mild degenerative disc disease with disc height loss at C6-7. Bilateral facet arthropathy at C4-5. Orbits: Negative. No traumatic or inflammatory finding. Left orbital banding. Sinuses: Mucous retention cyst in the left maxillary sinus. No paranasal sinus air-fluid level. Remainder the paranasal sinuses are clear. Mastoid sinuses are clear. Soft tissues: Negative. IMPRESSION: 1. No acute intracranial pathology. 2. No acute osseous injury of the maxillofacial bones. Electronically Signed   By: Kathreen Devoid   On: 04/24/2017 15:10   Dg Knee Complete 4 Views Right  Result Date: 04/24/2017 CLINICAL DATA:  Status post fall onto the right knee. EXAM: RIGHT KNEE - COMPLETE 4+ VIEW COMPARISON:  None. FINDINGS: No evidence of fracture, dislocation, or joint effusion. Stable appearance of medial hemiarthroplasty. Prepatellar soft tissue swelling. IMPRESSION: No acute fracture or dislocation identified about the right knee. Stable appearance of medial hemiarthroplasty. Electronically Signed   By: Fidela Salisbury M.D.   On: 04/24/2017 15:43   Ct Maxillofacial Wo Contrast  Result Date: 04/24/2017 CLINICAL DATA:  Status post fall last night. Right-sided facial bruising. EXAM: CT HEAD WITHOUT CONTRAST CT MAXILLOFACIAL WITHOUT CONTRAST TECHNIQUE: Multidetector CT imaging of the head and maxillofacial structures were performed using the standard protocol without intravenous contrast. Multiplanar CT image reconstructions of the maxillofacial structures were also generated. COMPARISON:  None. FINDINGS: CT HEAD FINDINGS Brain: No evidence of acute infarction, hemorrhage, extra-axial collection, ventriculomegaly, or mass effect. Generalized cerebral atrophy. Periventricular white matter low attenuation likely secondary to microangiopathy. Vascular: Cerebrovascular atherosclerotic  calcifications are noted. Skull: Negative for fracture or focal lesion. Other: None. CT MAXILLOFACIAL FINDINGS Osseous: No fracture or mandibular dislocation. No destructive process. 3 mm anterolisthesis of C3 on C4 secondary to bilateral moderate facet arthropathy. Degenerative disc disease  with disc height loss at C5-6. Mild degenerative disc disease with disc height loss at C6-7. Bilateral facet arthropathy at C4-5. Orbits: Negative. No traumatic or inflammatory finding. Left orbital banding. Sinuses: Mucous retention cyst in the left maxillary sinus. No paranasal sinus air-fluid level. Remainder the paranasal sinuses are clear. Mastoid sinuses are clear. Soft tissues: Negative. IMPRESSION: 1. No acute intracranial pathology. 2. No acute osseous injury of the maxillofacial bones. Electronically Signed   By: Kathreen Devoid   On: 04/24/2017 15:10   ____________________________________________    PROCEDURES  Procedure(s) performed: None  Procedures  Critical Care performed: None  ____________________________________________   INITIAL IMPRESSION / ASSESSMENT AND PLAN / ED COURSE  Pertinent labs & imaging results that were available during my care of the patient were reviewed by me and considered in my medical decision making (see chart for details).  Patient here after a non-syncopal fall, she has been having slight nosebleeds off and on on Eliquis, I placed TXA in the patient's nose to ensure clot formation, and then with silver nitrate cautery was able to cauterize what I believed to be likely source of bleeding.  She has been without any bleeding at this time in the department for several hours.  She absolutely does not want packing she has had packing before.  She is eager to go home.  Blood work is reassuring, x-ray of the knee is reassuring, does not appear to be more significant than a contusion at this time but I did advise her to follow-up if she has worsening symptoms in that knee.  Able  to ambulate.  Given that she has no longer bleeding I think that we have taken care of hopefully the cause of the problem we will send her home but she understands the bleeding recurs she is to return to the emergency department she is very comfortable with this plan.  Given recurrent epistaxis episodes, will also refer her to ENT.    ____________________________________________   FINAL CLINICAL IMPRESSION(S) / ED DIAGNOSES  Final diagnoses:  None      This chart was dictated using voice recognition software.  Despite best efforts to proofread,  errors can occur which can change meaning.      Schuyler Amor, MD 04/24/17 [phone]

## 2017-04-24 NOTE — ED Notes (Signed)
Pt discharged to home.  Family member driving.  Discharge instructions reviewed.  Verbalized understanding.  No questions or concerns at this time.  Teach back verified.  Pt in NAD.  No items left in ED.   

## 2017-04-24 NOTE — ED Notes (Signed)
Care handed off to primary RN, Iris

## 2017-04-27 ENCOUNTER — Ambulatory Visit
Admission: RE | Admit: 2017-04-27 | Discharge: 2017-04-27 | Disposition: A | Discharge status: Home / Self Care | Payer: 59 | Source: Ambulatory Visit | Attending: Surgery | Admitting: Surgery

## 2017-04-27 DIAGNOSIS — N6489 Other specified disorders of breast: Secondary | ICD-10-CM | POA: Diagnosis not present

## 2017-04-27 DIAGNOSIS — Z853 Personal history of malignant neoplasm of breast: Secondary | ICD-10-CM | POA: Diagnosis not present

## 2017-04-27 DIAGNOSIS — R928 Other abnormal and inconclusive findings on diagnostic imaging of breast: Secondary | ICD-10-CM | POA: Diagnosis not present

## 2017-05-09 DIAGNOSIS — Z853 Personal history of malignant neoplasm of breast: Secondary | ICD-10-CM | POA: Diagnosis not present

## 2017-05-10 ENCOUNTER — Ambulatory Visit
Admission: RE | Admit: 2017-05-10 | Discharge: 2017-05-10 | Disposition: A | Discharge status: Home / Self Care | Payer: Commercial Managed Care - HMO | Source: Ambulatory Visit | Attending: Oncology | Admitting: Oncology

## 2017-05-10 ENCOUNTER — Encounter: Payer: Self-pay | Admitting: Radiology

## 2017-05-10 DIAGNOSIS — N289 Disorder of kidney and ureter, unspecified: Secondary | ICD-10-CM | POA: Diagnosis not present

## 2017-05-10 DIAGNOSIS — I700 Atherosclerosis of aorta: Secondary | ICD-10-CM | POA: Insufficient documentation

## 2017-05-10 DIAGNOSIS — N2889 Other specified disorders of kidney and ureter: Secondary | ICD-10-CM | POA: Diagnosis not present

## 2017-05-10 MED ORDER — GADOBENATE DIMEGLUMINE 529 MG/ML IV SOLN
15.0000 mL | Freq: Once | INTRAVENOUS | Status: AC | PRN
  Administered 2017-05-10: 15 mL via INTRAVENOUS

## 2017-05-15 NOTE — Progress Notes (Signed)
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 MammaPrint low risk.  INTERVAL HISTORY:  Patient returns to clinic today for further evaluation and discussion of her imaging results.  She recently had a fall secondary to tripping over her dog, but otherwise is felt well.  She is tolerating letrozole well without significant side effects. She currently feels well and remains asymptomatic. She denies any fevers, night sweats, or weight loss. She has no neurologic complaints. She has noted no new lymphadenopathy. She has no chest pain or shortness of breath. She denies any nausea, vomiting, constipation, or diarrhea. She has no urinary complaints. Patient offers no specific complaints today.  REVIEW OF SYSTEMS:   Review of Systems  Constitutional: Negative for chills, diaphoresis, fever, malaise/fatigue and weight loss.  Respiratory: Negative.  Negative for cough and shortness of breath.   Cardiovascular: Negative.  Negative for chest pain and leg swelling.  Gastrointestinal: Negative.  Negative for abdominal pain.  Genitourinary: Negative.   Musculoskeletal: Negative.   Skin: Negative.  Negative for rash.  Neurological: Negative.  Negative for sensory change and weakness.  Psychiatric/Behavioral: Negative.  The patient is not nervous/anxious.     As per HPI. Otherwise, a complete review of systems is negative.  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 Anginal pain ([place])   . Arthritis t   knee  . Asthma     mild  . 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 Mamie Nick. at [facility] in [place]  . Depression   . Dyspnea   . Dysrhythmia   . GERD (gastroesophageal reflux disease)   . Hypertension   . Hypothyroidism   . MALT (mucosa associated lymphoid tissue) ([place]) 07/05/2014   Right neck mass resected 01/2014.  Marland Kitchen No pertinent past medical history   . Sleep apnea t   O2- 2l at bedtime and BiPap @ bedtime    PAST SURGICAL HISTORY: Past Surgical History:  Procedure Laterality Date  . BREAST BIOPSY Left 2/136/2018   INVASIVE MAMMARY CARCINOMA  . BREAST BIOPSY Right 05/25/2016   FIBROCYSTIC CHANGE WITH CALCIFICATIONS   . BREAST EXCISIONAL BIOPSY Left 06/04/2016   INVASIVE MAMMARY CARCINOMA.   Marland Kitchen BREAST LUMPECTOMY Left 06/04/2016   INVASIVE MAMMARY CARCINOMA.   Marland Kitchen CARDIAC CATHETERIZATION    . CARDIAC CATHETERIZATION N/A 09/24/2015   Procedure: Left Heart Cath and Coronary Angiography;  Surgeon: Isaias Cowman, MD;  Location: [facility];  Service: Cardiovascular;  Laterality: N/A;  . CARDIAC CATHETERIZATION N/A 09/24/2015   Procedure: Coronary Stent Intervention;  Surgeon: Isaias Cowman, MD;  Location: [facility];  Service: Cardiovascular;  Laterality: N/A;  . CHOLECYSTECTOMY  11/26   08/1970  . DILATATION & CURETTAGE/HYSTEROSCOPY WITH MYOSURE N/A 05/05/2015   Procedure: Hysteroscopy and endometrial curretage;  Surgeon: Boykin Nearing, MD;  Location: [facility];  Service: Gynecology;  Laterality: N/A;  . DILATION AND CURETTAGE OF UTERUS    . EXCISION MASS FROM NECK  Right  Dr. Tami Ribas  . EYE SURGERY  t   bil cataract 9/08  . HAMMER TOE SURGERY Right   . JOINT REPLACEMENT Bilateral 2008, 11/26/ 2012   Partial Knee Replacements  . PARTIAL MASTECTOMY WITH NEEDLE LOCALIZATION Left 06/04/2016   Procedure: PARTIAL MASTECTOMY WITH NEEDLE LOCALIZATION;  Surgeon: Leonie Green, MD;  Location: [facility];  Service: General;   Laterality: Left;  . SCLERAL BUCKLE  02/16/2011   Procedure: SCLERAL BUCKLE;  Surgeon: Hayden Pedro, MD;  Location: [facility];  Service: Ophthalmology;  Laterality: Left;  Scleral Buckle Left Eye with Headscope Laser  . SENTINEL NODE BIOPSY Left 06/04/2016   Procedure: SENTINEL NODE BIOPSY;  Surgeon: Leonie Green, MD;  Location: [facility];  Service: General;  Laterality: Left;    FAMILY HISTORY Family History  Problem Relation Age of Onset  . Breast cancer Mother 64  . Heart disease Father   . Breast cancer Paternal Aunt 73  . Breast cancer Cousin   . Anesthesia problems Neg Hx   . Hypotension Neg Hx   . Malignant hyperthermia Neg Hx   . Pseudochol deficiency Neg Hx     GYNECOLOGIC HISTORY:  No LMP recorded. Patient has had a hysterectomy.     ADVANCED DIRECTIVES:    HEALTH MAINTENANCE: Social History   Tobacco Use  . Smoking status: Never Smoker  . Smokeless tobacco: Never Used  Substance Use Topics  . Alcohol use: No  . Drug use: No     Colonoscopy:  PAP:  Bone density:  Mammogram: November 2016  Allergies  Allergen Reactions  . Codeine Other (See Comments)    Stroke like symptoms    Current Outpatient Medications  Medication Sig Dispense Refill  . Acetaminophen (MAPAP) 500 MG coapsule Take by mouth.    Marland Kitchen apixaban (ELIQUIS) 5 MG TABS tablet Take 5 mg by mouth 2 (two) times daily.    Marland Kitchen aspirin 81 MG chewable tablet Chew 81 mg by mouth daily.    Marland Kitchen Besifloxacin HCl (BESIVANCE) 0.6 % SUSP Place 1 drop into the left eye See admin instructions. AFTER EYE INJECTION    . brimonidine (ALPHAGAN) 0.2 % ophthalmic solution Place 1 drop into the left eye 2 (two) times daily.    . Cholecalciferol (VITAMIN D-1000 MAX ST) 1000 units tablet Take 1,000 Units by mouth daily.    Marland Kitchen diltiazem (CARDIZEM CD) 300 MG 24 hr capsule Take 300 mg by mouth daily.    Marland Kitchen FLUARIX QUADRIVALENT 0.5 ML injection ADM 0.5ML IM UTD  0  . Fluticasone-Salmeterol (ADVAIR) 100-50 MCG/DOSE AEPB  Inhale 1 puff into the lungs 2 (two) times daily as needed (for asthma.).    Marland Kitchen isosorbide mononitrate (IMDUR) 30 MG 24 hr tablet Take 30 mg by mouth daily.    Marland Kitchen letrozole (FEMARA) 2.5 MG tablet Take 1 tablet (2.5 mg total) by mouth daily. 30 tablet 11  . levothyroxine (SYNTHROID, LEVOTHROID) 150 MCG tablet Take 150 mcg by mouth daily.      . Melatonin 5 MG TABS Take 5 mg by mouth at bedtime.    . metoprolol (TOPROL-XL) 100 MG 24 hr tablet Take 50 mg by mouth 2 (two) times daily.     . nitroGLYCERIN (NITROSTAT) 0.4 MG SL tablet Place 0.4 mg under the tongue every 5 (five) minutes as needed for chest pain. Maximum 3 doses, If no relief call md or 911.    Marland Kitchen oxybutynin (DITROPAN) 5 MG tablet Take 5 mg by mouth daily.    Marland Kitchen  potassium chloride (K-DUR) 10 MEQ tablet Take 1 tablet (10 mEq total) by mouth daily. 30 tablet 5  . RABEprazole (ACIPHEX) 20 MG tablet Take 20 mg by mouth daily.      . sertraline (ZOLOFT) 50 MG tablet Take 50 mg by mouth at bedtime.      . simvastatin (ZOCOR) 40 MG tablet Take 40 mg by mouth at bedtime.      . torsemide (DEMADEX) 20 MG tablet Take 20 mg by mouth daily as needed.     . vitamin B-12 (CYANOCOBALAMIN) 100 MCG tablet Take 100 mcg by mouth daily.     No current facility-administered medications for this visit.     OBJECTIVE: BP 123/77 (BP Location: Right Arm, Patient Position: Sitting)   Pulse 72   Temp 98.2 F (36.8 C) (Tympanic)   Resp 18   Wt 191 lb 2 oz (86.7 kg)   BMI 33.86 kg/m    Body mass index is 33.86 kg/m.    ECOG FS:0 - Asymptomatic  General: Well-developed, well-nourished, no acute distress. Eyes: Pink conjunctiva, anicteric sclera. Breasts: Well-healed surgical scar on left breast. HEENT: Normocephalic, moist mucous membranes, clear oropharnyx. No palpable lymphadenopathy. Lungs: Clear to auscultation bilaterally. Heart: Regular rate and rhythm. No rubs, murmurs, or gallops. Abdomen: Soft, nontender, nondistended. No organomegaly noted,  normoactive bowel sounds. Musculoskeletal: No edema, cyanosis, or clubbing. Neuro: Alert, answering all questions appropriately. Cranial nerves grossly intact. Skin: No rashes or petechiae noted. Psych: Normal affect. Lymphatics: No cervical, clavicular, or axillary LAD.   LAB RESULTS:   STUDIES: No results found.  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 MammaPrint low risk.  PLAN:    1. 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 MammaPrint was reported as low risk, therefore she did not require adjuvant chemotherapy. She also completed adjuvant XRT. Continue letrozole for total 5 years completing in July 2023.  Her most recent mammogram on April 27, 2017 was reported as BI-RADS 3.  Repeat in February 2020.  Return to clinic in 6 months for routine evaluation.  2. MALT. Patient is status post excision of a right submandibular gland on February 05, 2014. Patient's most recent CT scans on November 16, 2016 reviewed independently and reported as above no obvious evidence of recurrent disease.  She also had a maxillofacial CT on April 24, 2017 that revealed no evidence of disease.  Will repeat imaging in February 2020.   3. Renal lesion: MRI results reviewed independently indicating this is likely a benign proteinaceous cyst.  Will repeat imaging with CT scan in February 2020 as well. 4.  Bone marrow density: Patient had a normal bone marrow density on October 06, 2016 which reported T score of 0.0.  Repeat in July 2020.  Approximately 30 minutes was spent in discussion of which greater than 50% was consultation.  Patient expressed understanding and was in agreement with this plan. She also understands that She can  call clinic at any time with any questions, concerns, or complaints.     Timothy J Finnegan, MD   05/21/2017 8:28 AM      

## 2017-05-19 ENCOUNTER — Inpatient Hospital Stay: Payer: Commercial Managed Care - HMO | Attending: Oncology | Admitting: Oncology

## 2017-05-19 VITALS — BP 123/77 | HR 72 | Temp 98.2°F | Resp 18 | Wt 191.1 lb

## 2017-05-19 DIAGNOSIS — Z170 Estrogen receptor positive status [ER+]: Secondary | ICD-10-CM | POA: Diagnosis not present

## 2017-05-19 DIAGNOSIS — N289 Disorder of kidney and ureter, unspecified: Secondary | ICD-10-CM

## 2017-05-19 DIAGNOSIS — C884 Extranodal marginal zone B-cell lymphoma of mucosa-associated lymphoid tissue [MALT-lymphoma]: Secondary | ICD-10-CM | POA: Diagnosis not present

## 2017-05-19 DIAGNOSIS — C50412 Malignant neoplasm of upper-outer quadrant of left female breast: Secondary | ICD-10-CM

## 2017-05-19 NOTE — Progress Notes (Signed)
Pt in today for follow up and results.  Denies any difficulties or concerns.

## 2017-05-25 ENCOUNTER — Encounter (INDEPENDENT_AMBULATORY_CARE_PROVIDER_SITE_OTHER): Payer: 59 | Admitting: Ophthalmology

## 2017-05-25 DIAGNOSIS — H348312 Tributary (branch) retinal vein occlusion, right eye, stable: Secondary | ICD-10-CM

## 2017-05-25 DIAGNOSIS — H338 Other retinal detachments: Secondary | ICD-10-CM | POA: Diagnosis not present

## 2017-05-25 DIAGNOSIS — H34812 Central retinal vein occlusion, left eye, with macular edema: Secondary | ICD-10-CM

## 2017-05-25 DIAGNOSIS — H35033 Hypertensive retinopathy, bilateral: Secondary | ICD-10-CM

## 2017-05-25 DIAGNOSIS — H43813 Vitreous degeneration, bilateral: Secondary | ICD-10-CM | POA: Diagnosis not present

## 2017-05-25 DIAGNOSIS — I10 Essential (primary) hypertension: Secondary | ICD-10-CM

## 2017-06-01 DIAGNOSIS — J340 Abscess, furuncle and carbuncle of nose: Secondary | ICD-10-CM | POA: Diagnosis not present

## 2017-06-22 ENCOUNTER — Other Ambulatory Visit: Payer: Self-pay | Admitting: General Surgery

## 2017-06-22 DIAGNOSIS — Z853 Personal history of malignant neoplasm of breast: Secondary | ICD-10-CM

## 2017-07-04 ENCOUNTER — Other Ambulatory Visit: Payer: Self-pay | Admitting: General Surgery

## 2017-07-04 DIAGNOSIS — Z853 Personal history of malignant neoplasm of breast: Secondary | ICD-10-CM

## 2017-07-12 DIAGNOSIS — H40052 Ocular hypertension, left eye: Secondary | ICD-10-CM | POA: Diagnosis not present

## 2017-07-12 DIAGNOSIS — H348122 Central retinal vein occlusion, left eye, stable: Secondary | ICD-10-CM | POA: Diagnosis not present

## 2017-07-12 DIAGNOSIS — H402220 Chronic angle-closure glaucoma, left eye, stage unspecified: Secondary | ICD-10-CM | POA: Diagnosis not present

## 2017-07-13 DIAGNOSIS — I480 Paroxysmal atrial fibrillation: Secondary | ICD-10-CM | POA: Diagnosis not present

## 2017-07-13 DIAGNOSIS — I208 Other forms of angina pectoris: Secondary | ICD-10-CM | POA: Diagnosis not present

## 2017-07-13 DIAGNOSIS — I2510 Atherosclerotic heart disease of native coronary artery without angina pectoris: Secondary | ICD-10-CM | POA: Diagnosis not present

## 2017-07-21 DIAGNOSIS — R739 Hyperglycemia, unspecified: Secondary | ICD-10-CM | POA: Diagnosis not present

## 2017-07-21 DIAGNOSIS — E538 Deficiency of other specified B group vitamins: Secondary | ICD-10-CM | POA: Diagnosis not present

## 2017-07-21 DIAGNOSIS — Z9861 Coronary angioplasty status: Secondary | ICD-10-CM | POA: Diagnosis not present

## 2017-07-21 DIAGNOSIS — I480 Paroxysmal atrial fibrillation: Secondary | ICD-10-CM | POA: Diagnosis not present

## 2017-07-21 DIAGNOSIS — I2510 Atherosclerotic heart disease of native coronary artery without angina pectoris: Secondary | ICD-10-CM | POA: Diagnosis not present

## 2017-07-21 DIAGNOSIS — E782 Mixed hyperlipidemia: Secondary | ICD-10-CM | POA: Diagnosis not present

## 2017-07-26 DIAGNOSIS — I2510 Atherosclerotic heart disease of native coronary artery without angina pectoris: Secondary | ICD-10-CM | POA: Diagnosis not present

## 2017-07-26 DIAGNOSIS — I480 Paroxysmal atrial fibrillation: Secondary | ICD-10-CM | POA: Diagnosis not present

## 2017-07-26 DIAGNOSIS — I10 Essential (primary) hypertension: Secondary | ICD-10-CM | POA: Diagnosis not present

## 2017-07-27 DIAGNOSIS — D500 Iron deficiency anemia secondary to blood loss (chronic): Secondary | ICD-10-CM | POA: Diagnosis not present

## 2017-07-27 DIAGNOSIS — E039 Hypothyroidism, unspecified: Secondary | ICD-10-CM | POA: Diagnosis not present

## 2017-07-27 DIAGNOSIS — I208 Other forms of angina pectoris: Secondary | ICD-10-CM | POA: Diagnosis not present

## 2017-07-28 ENCOUNTER — Ambulatory Visit
Admission: RE | Admit: 2017-07-28 | Discharge: 2017-07-28 | Disposition: A | Discharge status: Home / Self Care | Payer: 59 | Source: Ambulatory Visit | Attending: General Surgery | Admitting: General Surgery

## 2017-07-28 DIAGNOSIS — Z853 Personal history of malignant neoplasm of breast: Secondary | ICD-10-CM | POA: Diagnosis not present

## 2017-07-28 DIAGNOSIS — N6489 Other specified disorders of breast: Secondary | ICD-10-CM | POA: Insufficient documentation

## 2017-07-28 DIAGNOSIS — R928 Other abnormal and inconclusive findings on diagnostic imaging of breast: Secondary | ICD-10-CM | POA: Diagnosis not present

## 2017-07-28 HISTORY — PX: EYE SURGERY: SHX253

## 2017-08-02 DIAGNOSIS — I208 Other forms of angina pectoris: Secondary | ICD-10-CM | POA: Diagnosis not present

## 2017-08-02 DIAGNOSIS — R0602 Shortness of breath: Secondary | ICD-10-CM | POA: Diagnosis not present

## 2017-08-02 DIAGNOSIS — I2510 Atherosclerotic heart disease of native coronary artery without angina pectoris: Secondary | ICD-10-CM | POA: Diagnosis not present

## 2017-08-03 ENCOUNTER — Encounter (INDEPENDENT_AMBULATORY_CARE_PROVIDER_SITE_OTHER): Payer: 59 | Admitting: Ophthalmology

## 2017-08-03 DIAGNOSIS — H35033 Hypertensive retinopathy, bilateral: Secondary | ICD-10-CM

## 2017-08-03 DIAGNOSIS — H348312 Tributary (branch) retinal vein occlusion, right eye, stable: Secondary | ICD-10-CM

## 2017-08-03 DIAGNOSIS — H43813 Vitreous degeneration, bilateral: Secondary | ICD-10-CM

## 2017-08-03 DIAGNOSIS — I10 Essential (primary) hypertension: Secondary | ICD-10-CM

## 2017-08-03 DIAGNOSIS — H34812 Central retinal vein occlusion, left eye, with macular edema: Secondary | ICD-10-CM

## 2017-08-03 DIAGNOSIS — H338 Other retinal detachments: Secondary | ICD-10-CM | POA: Diagnosis not present

## 2017-08-04 DIAGNOSIS — Z853 Personal history of malignant neoplasm of breast: Secondary | ICD-10-CM | POA: Diagnosis not present

## 2017-08-10 DIAGNOSIS — I480 Paroxysmal atrial fibrillation: Secondary | ICD-10-CM | POA: Diagnosis not present

## 2017-08-10 DIAGNOSIS — I2510 Atherosclerotic heart disease of native coronary artery without angina pectoris: Secondary | ICD-10-CM | POA: Diagnosis not present

## 2017-08-10 DIAGNOSIS — I10 Essential (primary) hypertension: Secondary | ICD-10-CM | POA: Diagnosis not present

## 2017-08-13 IMAGING — MG MM PLC BREAST LOC DEV 1ST LESION INC MAMMO GUIDE*L*
6 series · 6 of 6 positions shown · non-contrast
Comparison: Previous exams.

CLINICAL DATA: Double needle localization of 2 sites in the
upper-outer left breast prior to lumpectomy.

EXAM:
NEEDLE LOCALIZATION OF THE LEFT BREAST WITH MAMMO GUIDANCE

[L CC (1 of 3)]
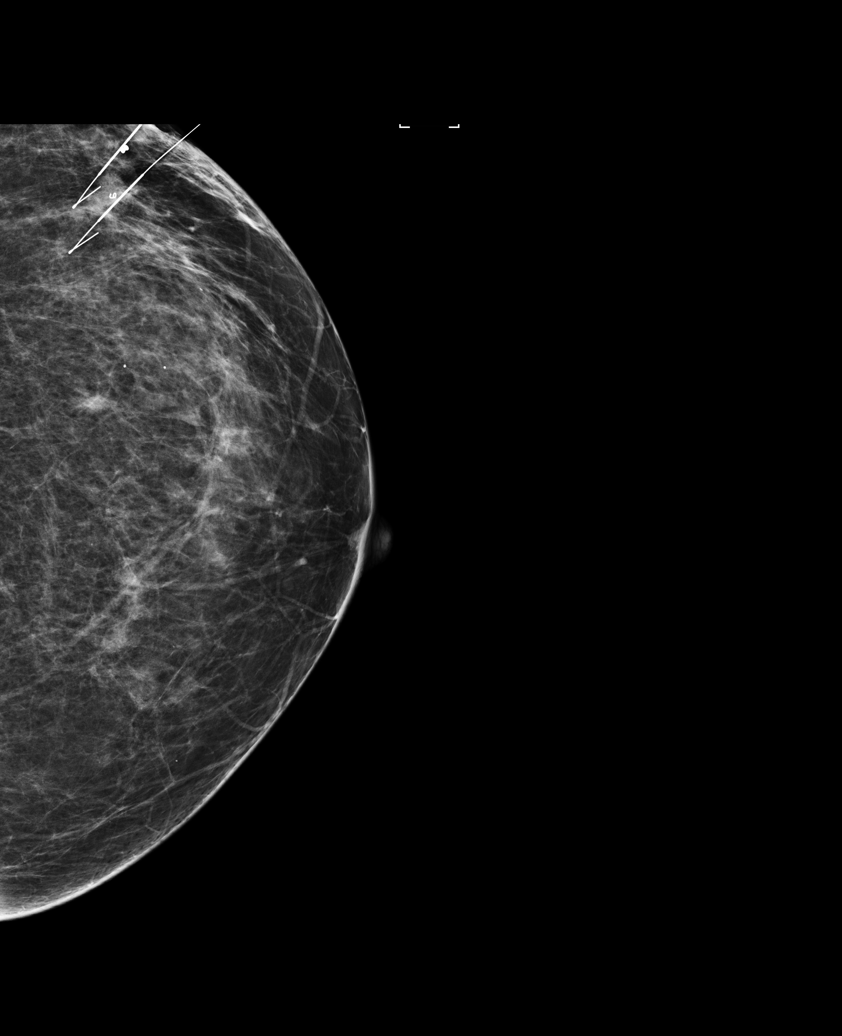

[L CC (2 of 3)]
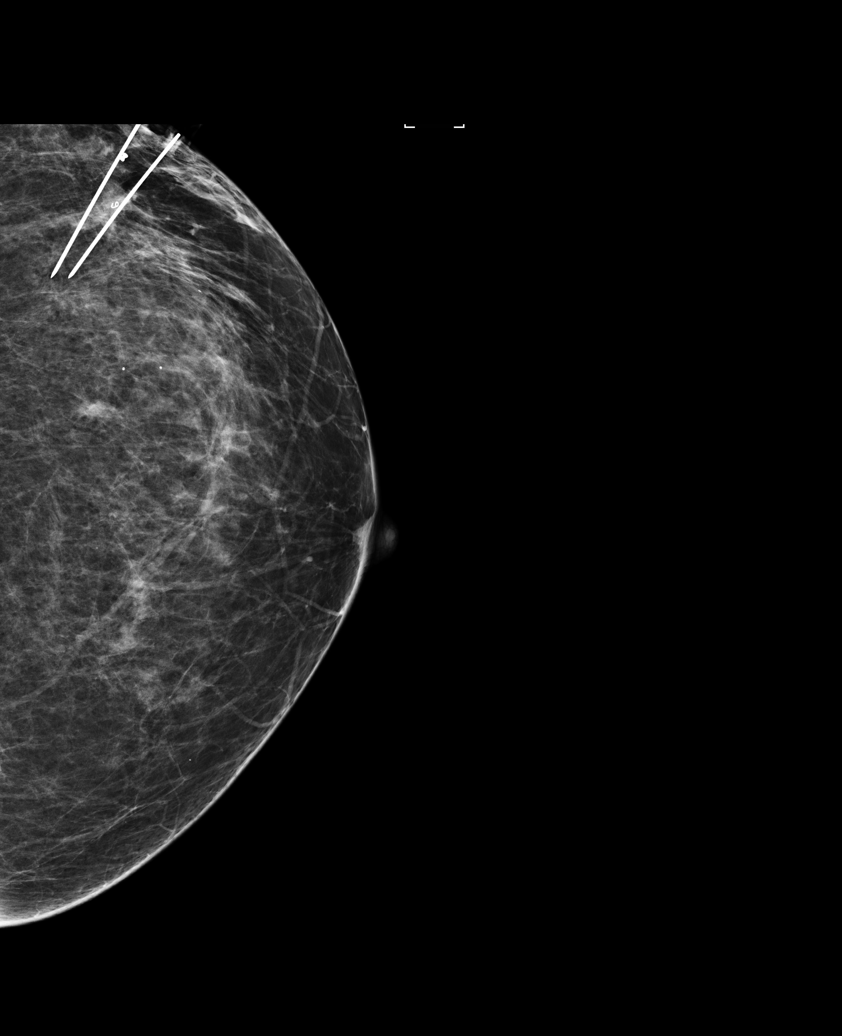

[L LM (1 of 3)]
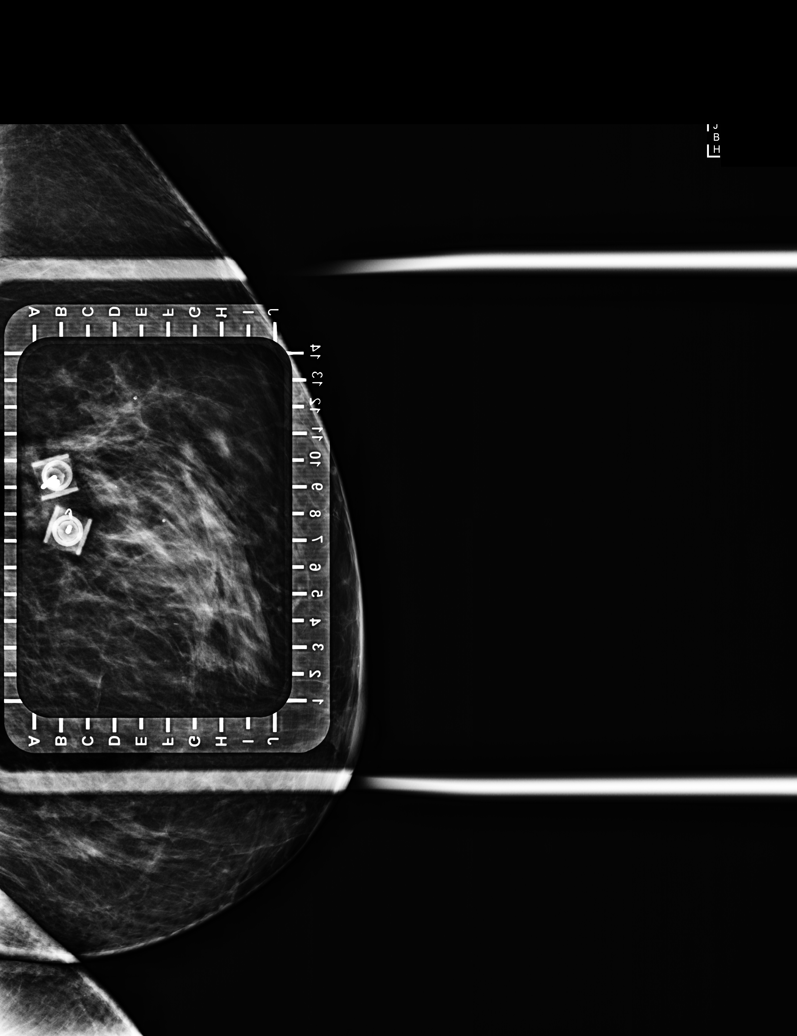

[L CC (3 of 3)]
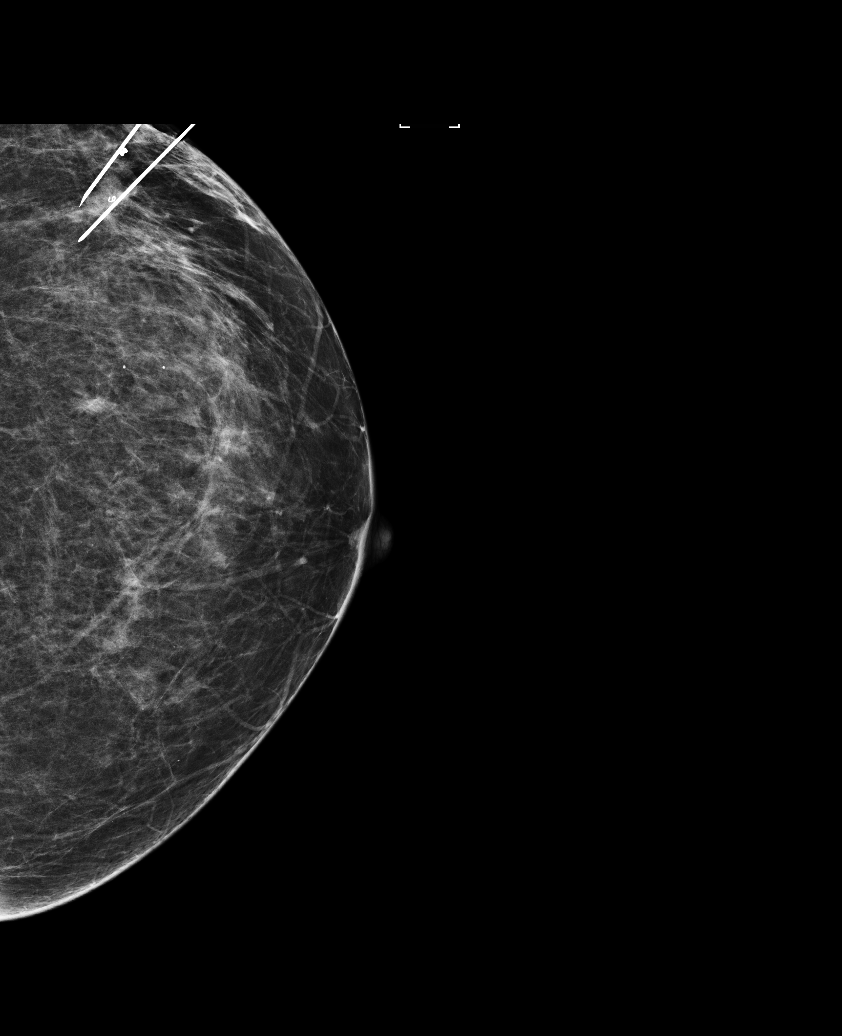

[L LM (2 of 3)]
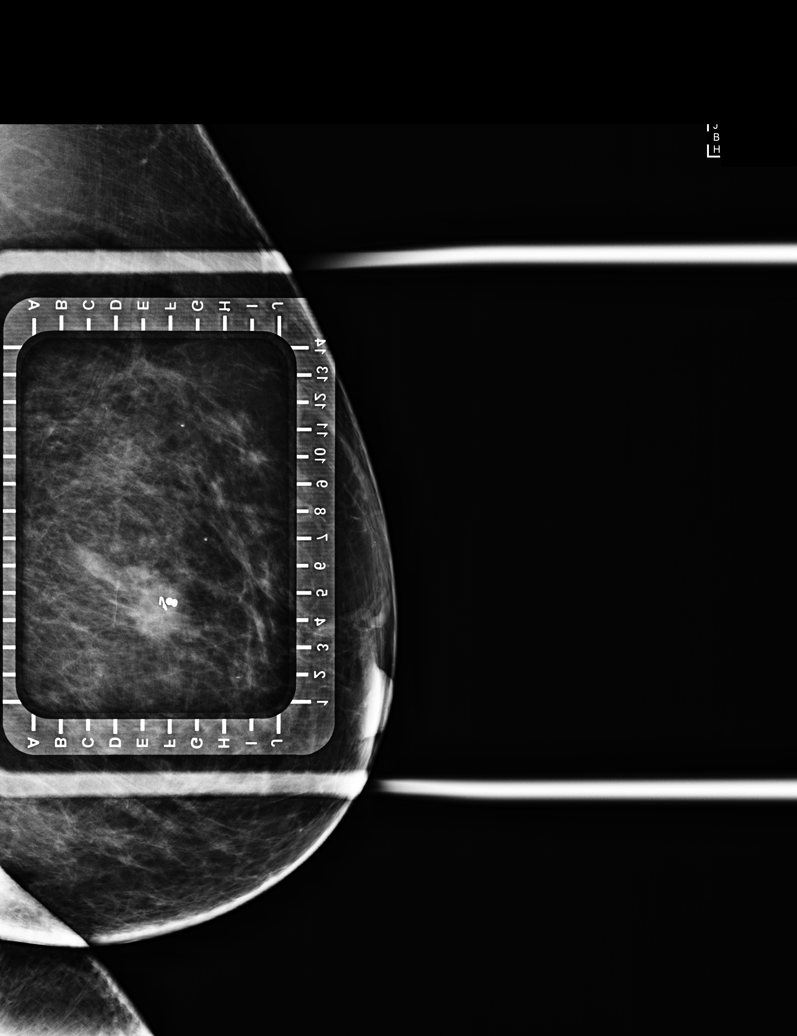

[L LM (3 of 3)]
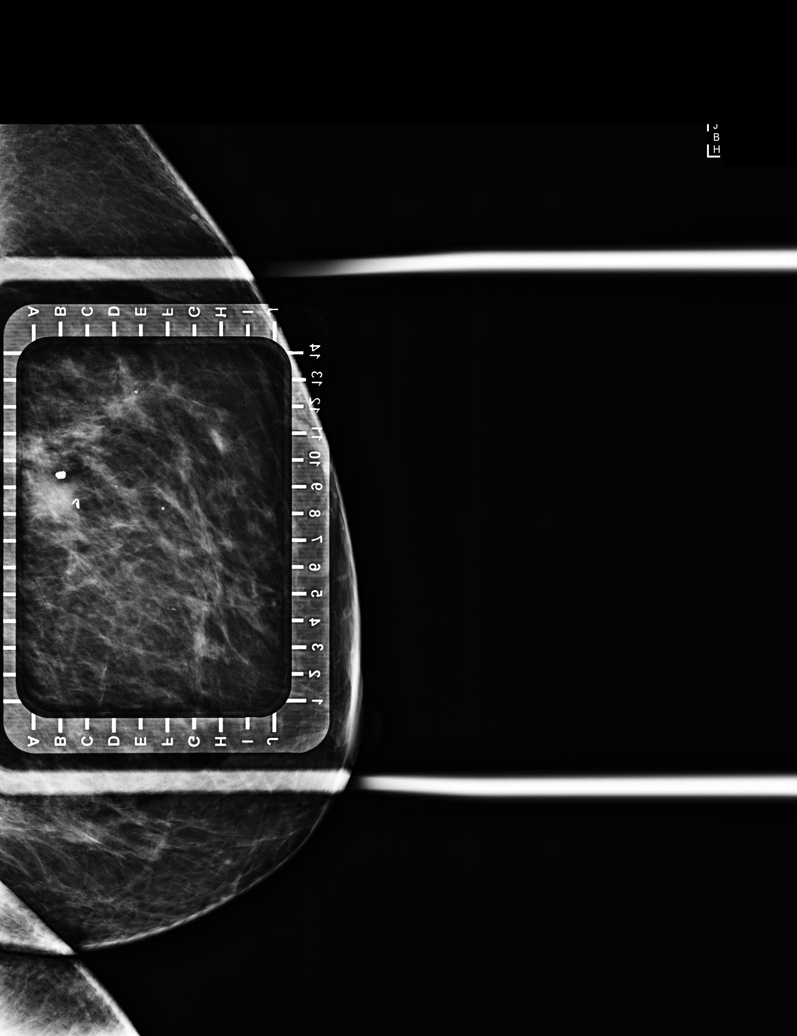

[6 of 6 positions shown; findings below may reference images not displayed]

FINDINGS: Patient presents for needle localization prior to lumpectomy of the
left breast. I met with the patient and we discussed the procedure
of needle localization including benefits and alternatives. We
discussed the high likelihood of a successful procedure. We
discussed the risks of the procedure, including infection, bleeding,
tissue injury, and further surgery. Informed, written consent was
given. The usual time-out protocol was performed immediately prior
to the procedure.

Using mammographic guidance, sterile technique, 1% lidocaine and two
5 cm modified Kopans needle, the 2 biopsy marking clips in the
upper-outer left breast were localized using a lateral approach. The
images were marked for Dr. Kaki.
IMPRESSION: Double needle localization of the left breast. No apparent
complications.

## 2017-08-22 DIAGNOSIS — H4062X0 Glaucoma secondary to drugs, left eye, stage unspecified: Secondary | ICD-10-CM | POA: Diagnosis not present

## 2017-08-22 DIAGNOSIS — Z01818 Encounter for other preprocedural examination: Secondary | ICD-10-CM | POA: Diagnosis not present

## 2017-08-29 DIAGNOSIS — D500 Iron deficiency anemia secondary to blood loss (chronic): Secondary | ICD-10-CM | POA: Diagnosis not present

## 2017-09-14 DIAGNOSIS — H4062X0 Glaucoma secondary to drugs, left eye, stage unspecified: Secondary | ICD-10-CM | POA: Diagnosis not present

## 2017-09-15 DIAGNOSIS — H318 Other specified disorders of choroid: Secondary | ICD-10-CM | POA: Diagnosis not present

## 2017-09-17 ENCOUNTER — Other Ambulatory Visit: Payer: Self-pay | Admitting: Oncology

## 2017-10-03 DIAGNOSIS — H318 Other specified disorders of choroid: Secondary | ICD-10-CM | POA: Diagnosis not present

## 2017-10-12 ENCOUNTER — Other Ambulatory Visit: Payer: Self-pay | Admitting: General Surgery

## 2017-10-12 ENCOUNTER — Encounter (INDEPENDENT_AMBULATORY_CARE_PROVIDER_SITE_OTHER): Payer: 59 | Admitting: Ophthalmology

## 2017-10-12 DIAGNOSIS — Z853 Personal history of malignant neoplasm of breast: Secondary | ICD-10-CM

## 2017-10-12 DIAGNOSIS — H348312 Tributary (branch) retinal vein occlusion, right eye, stable: Secondary | ICD-10-CM | POA: Diagnosis not present

## 2017-10-12 DIAGNOSIS — H34812 Central retinal vein occlusion, left eye, with macular edema: Secondary | ICD-10-CM | POA: Diagnosis not present

## 2017-10-12 DIAGNOSIS — H43813 Vitreous degeneration, bilateral: Secondary | ICD-10-CM

## 2017-10-12 DIAGNOSIS — I10 Essential (primary) hypertension: Secondary | ICD-10-CM

## 2017-10-12 DIAGNOSIS — H35033 Hypertensive retinopathy, bilateral: Secondary | ICD-10-CM | POA: Diagnosis not present

## 2017-10-17 ENCOUNTER — Other Ambulatory Visit: Payer: Self-pay

## 2017-10-17 ENCOUNTER — Other Ambulatory Visit: Payer: Self-pay | Admitting: *Deleted

## 2017-10-17 ENCOUNTER — Ambulatory Visit
Admission: RE | Admit: 2017-10-17 | Discharge: 2017-10-17 | Disposition: A | Discharge status: Home / Self Care | Payer: 59 | Source: Ambulatory Visit | Attending: Radiation Oncology | Admitting: Radiation Oncology

## 2017-10-17 ENCOUNTER — Encounter: Payer: Self-pay | Admitting: Radiation Oncology

## 2017-10-17 VITALS — BP 140/82 | HR 53 | Temp 98.9°F | Resp 20 | Wt 195.7 lb

## 2017-10-17 DIAGNOSIS — Z853 Personal history of malignant neoplasm of breast: Secondary | ICD-10-CM | POA: Diagnosis not present

## 2017-10-17 DIAGNOSIS — Z9889 Other specified postprocedural states: Secondary | ICD-10-CM | POA: Insufficient documentation

## 2017-10-17 DIAGNOSIS — Z923 Personal history of irradiation: Secondary | ICD-10-CM | POA: Insufficient documentation

## 2017-10-17 DIAGNOSIS — C50412 Malignant neoplasm of upper-outer quadrant of left female breast: Secondary | ICD-10-CM

## 2017-10-17 DIAGNOSIS — Z08 Encounter for follow-up examination after completed treatment for malignant neoplasm: Secondary | ICD-10-CM | POA: Insufficient documentation

## 2017-10-17 DIAGNOSIS — C50912 Malignant neoplasm of unspecified site of left female breast: Secondary | ICD-10-CM | POA: Diagnosis present

## 2017-10-17 DIAGNOSIS — Z170 Estrogen receptor positive status [ER+]: Secondary | ICD-10-CM

## 2017-10-17 NOTE — Progress Notes (Signed)
Radiation Oncology Follow up Note  Name: Stacey Stone   Date:   10/17/2017 MRN:  [id number] DOB: 03-Feb-1946    This 72 y.o. female presents to the clinic today for 1 year follow-up status post whole breast radiation to her left breast for ER/PR positive invasive mammary carcinoma stage I.  REFERRING PROVIDER: Rusty Aus, MD  HPI: patient is a 72 year old female now 1 year out having completed whole breast radiation to her left breast for stage I ER/PR positive invasive mammary carcinoma. Seen today in routine follow-up she is doing well. She specifically denies breast tenderness cough or bone pain..her last mammogram which I have reviewed those were benign BI-RADS 1 back in May she scheduled for follow-up mammograms next month.she is currently on Femara tolerate that well without side effect.  COMPLICATIONS OF TREATMENT: none  FOLLOW UP COMPLIANCE: keeps appointments   PHYSICAL EXAM:  BP 140/82   Pulse (!) 53   Temp 98.9 F (37.2 C)   Resp 20   Wt 195 lb 10.5 oz (88.7 kg)   BMI 34.66 kg/m  Lungs are clear to A&P cardiac examination essentially unremarkable with regular rate and rhythm. No dominant mass or nodularity is noted in either breast in 2 positions examined. Incision is well-healed. No axillary or supraclavicular adenopathy is appreciated. Cosmetic result is excellent.Well-developed well-nourished patient in NAD. HEENT reveals PERLA, EOMI, discs not visualized.  Oral cavity is clear. No oral mucosal lesions are identified. Neck is clear without evidence of cervical or supraclavicular adenopathy. Lungs are clear to A&P. Cardiac examination is essentially unremarkable with regular rate and rhythm without murmur rub or thrill. Abdomen is benign with no organomegaly or masses noted. Motor sensory and DTR levels are equal and symmetric in the upper and lower extremities. Cranial nerves II through XII are grossly intact. Proprioception is intact. No peripheral adenopathy or  edema is identified. No motor or sensory levels are noted. Crude visual fields are within normal range.  RADIOLOGY RESULTS: mammograms are reviewed and compatible with the above-stated findings  PLAN: present time 1 year out she continues to do well with no evidence of disease. She continues on Femara without side effect. She is scheduled for follow-up mammograms next month which I will review. Otherwise I'm please were overall progress. I have asked to see her back in 1 year for follow-up. She knows to call sooner with any concerns.  I would like to take this opportunity to thank you for allowing me to participate in the care of your patient.Noreene Filbert, MD

## 2017-10-23 ENCOUNTER — Emergency Department: Payer: Commercial Managed Care - HMO

## 2017-10-23 ENCOUNTER — Inpatient Hospital Stay
Admission: EM | Admit: 2017-10-23 | Discharge: 2017-10-27 | DRG: 286 | Disposition: A | Discharge status: Home / Self Care | Payer: Commercial Managed Care - HMO | Attending: Internal Medicine | Admitting: Internal Medicine

## 2017-10-23 ENCOUNTER — Encounter: Payer: Self-pay | Admitting: Emergency Medicine

## 2017-10-23 ENCOUNTER — Other Ambulatory Visit: Payer: Self-pay

## 2017-10-23 DIAGNOSIS — Z79890 Hormone replacement therapy (postmenopausal): Secondary | ICD-10-CM

## 2017-10-23 DIAGNOSIS — I4891 Unspecified atrial fibrillation: Secondary | ICD-10-CM

## 2017-10-23 DIAGNOSIS — Z885 Allergy status to narcotic agent status: Secondary | ICD-10-CM

## 2017-10-23 DIAGNOSIS — Z96653 Presence of artificial knee joint, bilateral: Secondary | ICD-10-CM | POA: Diagnosis present

## 2017-10-23 DIAGNOSIS — Z9049 Acquired absence of other specified parts of digestive tract: Secondary | ICD-10-CM

## 2017-10-23 DIAGNOSIS — I25110 Atherosclerotic heart disease of native coronary artery with unstable angina pectoris: Secondary | ICD-10-CM | POA: Diagnosis not present

## 2017-10-23 DIAGNOSIS — Z7901 Long term (current) use of anticoagulants: Secondary | ICD-10-CM

## 2017-10-23 DIAGNOSIS — R079 Chest pain, unspecified: Secondary | ICD-10-CM | POA: Diagnosis present

## 2017-10-23 DIAGNOSIS — Z8249 Family history of ischemic heart disease and other diseases of the circulatory system: Secondary | ICD-10-CM

## 2017-10-23 DIAGNOSIS — R7989 Other specified abnormal findings of blood chemistry: Secondary | ICD-10-CM

## 2017-10-23 DIAGNOSIS — R0789 Other chest pain: Secondary | ICD-10-CM | POA: Diagnosis not present

## 2017-10-23 DIAGNOSIS — G4733 Obstructive sleep apnea (adult) (pediatric): Secondary | ICD-10-CM | POA: Diagnosis present

## 2017-10-23 DIAGNOSIS — Z803 Family history of malignant neoplasm of breast: Secondary | ICD-10-CM

## 2017-10-23 DIAGNOSIS — N179 Acute kidney failure, unspecified: Secondary | ICD-10-CM | POA: Diagnosis present

## 2017-10-23 DIAGNOSIS — E785 Hyperlipidemia, unspecified: Secondary | ICD-10-CM | POA: Diagnosis present

## 2017-10-23 DIAGNOSIS — Z7951 Long term (current) use of inhaled steroids: Secondary | ICD-10-CM

## 2017-10-23 DIAGNOSIS — K219 Gastro-esophageal reflux disease without esophagitis: Secondary | ICD-10-CM | POA: Diagnosis present

## 2017-10-23 DIAGNOSIS — Z9012 Acquired absence of left breast and nipple: Secondary | ICD-10-CM

## 2017-10-23 DIAGNOSIS — Z79899 Other long term (current) drug therapy: Secondary | ICD-10-CM

## 2017-10-23 DIAGNOSIS — Z853 Personal history of malignant neoplasm of breast: Secondary | ICD-10-CM

## 2017-10-23 DIAGNOSIS — Z955 Presence of coronary angioplasty implant and graft: Secondary | ICD-10-CM

## 2017-10-23 DIAGNOSIS — I480 Paroxysmal atrial fibrillation: Secondary | ICD-10-CM | POA: Diagnosis present

## 2017-10-23 DIAGNOSIS — E876 Hypokalemia: Secondary | ICD-10-CM | POA: Diagnosis present

## 2017-10-23 DIAGNOSIS — E039 Hypothyroidism, unspecified: Secondary | ICD-10-CM | POA: Diagnosis present

## 2017-10-23 DIAGNOSIS — R748 Abnormal levels of other serum enzymes: Secondary | ICD-10-CM | POA: Diagnosis not present

## 2017-10-23 DIAGNOSIS — R778 Other specified abnormalities of plasma proteins: Secondary | ICD-10-CM

## 2017-10-23 DIAGNOSIS — Z7982 Long term (current) use of aspirin: Secondary | ICD-10-CM

## 2017-10-23 DIAGNOSIS — I7777 Dissection of artery of lower extremity: Secondary | ICD-10-CM | POA: Diagnosis not present

## 2017-10-23 DIAGNOSIS — I10 Essential (primary) hypertension: Secondary | ICD-10-CM | POA: Diagnosis present

## 2017-10-23 DIAGNOSIS — F329 Major depressive disorder, single episode, unspecified: Secondary | ICD-10-CM | POA: Diagnosis present

## 2017-10-23 HISTORY — DX: Malignant neoplasm of unspecified site of unspecified female breast: C50.919

## 2017-10-23 LAB — CBC
HEMATOCRIT: 42.6 % (ref 35.0–47.0)
Hemoglobin: 14 g/dL (ref 12.0–16.0)
MCH: 25.2 pg — ABNORMAL LOW (ref 26.0–34.0)
MCHC: 32.9 g/dL (ref 32.0–36.0)
MCV: 76.5 fL — ABNORMAL LOW (ref 80.0–100.0)
Platelets: 252 10*3/uL (ref 150–440)
RBC: 5.56 MIL/uL — ABNORMAL HIGH (ref 3.80–5.20)
RDW: 19.5 % — AB (ref 11.5–14.5)
WBC: 10.6 10*3/uL (ref 3.6–11.0)

## 2017-10-23 LAB — BASIC METABOLIC PANEL
Anion gap: 10 (ref 5–15)
BUN: 19 mg/dL (ref 8–23)
CALCIUM: 9.2 mg/dL (ref 8.9–10.3)
CO2: 27 mmol/L (ref 22–32)
Chloride: 104 mmol/L (ref 98–111)
Creatinine, Ser: 1.21 mg/dL — ABNORMAL HIGH (ref 0.44–1.00)
GFR calc Af Amer: 50 mL/min — ABNORMAL LOW (ref >60)
GFR, EST NON AFRICAN AMERICAN: 43 mL/min — AB (ref >60)
GLUCOSE: 107 mg/dL — AB (ref 70–99)
POTASSIUM: 3.3 mmol/L — AB (ref 3.5–5.1)
Sodium: 141 mmol/L (ref 135–145)

## 2017-10-23 LAB — TROPONIN I
TROPONIN I: 0.07 ng/mL — AB (ref <0.03)
Troponin I: 0.03 ng/mL (ref <0.03)

## 2017-10-23 NOTE — ED Notes (Signed)
Dr Archie Balboa notified of Troponin 0.03

## 2017-10-23 NOTE — ED Notes (Signed)
Pt says around 430 pm today she started having a sharp pains that started in the top of her head and radiated down through bilateral jaw line, arms and across chest; felt like her heart was beating too fast and irregular; at it's worst her pain was 10/10; pt says she waited for a while before calling EMS, hoping her symptoms would resolve themselves; says before receiving medication in the ambulance, she was feeling nauseous and diaphoretic; she also felt like she was having a hard time catching her breath; pt arrives symptom free;

## 2017-10-23 NOTE — ED Notes (Signed)
Troponin repeat test sent to lab;

## 2017-10-23 NOTE — ED Provider Notes (Signed)
[facility] Emergency Department Provider Note   ____________________________________________   I have reviewed the triage vital signs and the nursing notes.   HISTORY  Chief Complaint Chest Pain   History limited by: Not Limited   HPI Stacey Stone is a 72 y.o. female who presents to the emergency department today because of concerns for chest pain.  She states that it started shortly prior to arrival.  She had pain on the left side of her chest.  Felt like her heart was racing.  Pain then radiated up into her jaw.  She does have a history of atrial fibrillation and when EMS arrived found her to be in atrial fibrillation heart rate in the 120s to 180s.  She was given IV medication during transport which did break her and put her back into a normal sinus rhythm.  She states that typically when she has her atrial fibrillation she does not have the associated jaw pain.  The symptoms did resolve when the patient was given the medicine by EMS.  She denies any recent illness.    Per medical record review patient has a history of paroxysmal afib.  Past Medical History:  Diagnosis Date  . Anginal pain ([place])   . Arthritis t   knee  . Asthma    mild  . Breast cancer ([place])   . 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 Mamie Nick. at [facility] in [place]  . Depression   . Dyspnea   . Dysrhythmia   . GERD (gastroesophageal reflux disease)   . Hypertension   . Hypothyroidism   . 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  Marland Kitchen No pertinent past medical history   . Sleep apnea t   O2- 2l at bedtime and BiPap @ bedtime    Patient Active Problem List   Diagnosis Date Noted  . PMB (postmenopausal bleeding) 06/17/2016  . Preoperative cardiovascular examination 05/13/2016  . Primary cancer of upper outer quadrant of left female breast ([place]) 05/09/2016  . Unstable angina ([place])  09/24/2015  . S/P cardiac catheterization 09/24/2015  . History of total right knee replacement 06/25/2015  . B12 deficiency 01/15/2015  . Vitamin D deficiency 01/15/2015  . PAF (paroxysmal atrial fibrillation) ([place]) 12/15/2014  . Chest pain 12/15/2014  . Aortic valve disorder 11/06/2014  . 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 OSA on CPAP 01/02/2014  . Angina at rest [facility]) 06/16/2013  . HTN (hypertension) 06/16/2013  . Hypercholesterolemia 06/16/2013  . Hypothyroidism 06/16/2013  . 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 CAD S/P percutaneous coronary angioplasty 01/08/2006    Past Surgical History:  Procedure Laterality Date  . BREAST BIOPSY Left 2/136/2018   INVASIVE MAMMARY CARCINOMA  . BREAST BIOPSY Right 05/25/2016   FIBROCYSTIC CHANGE WITH CALCIFICATIONS   . BREAST EXCISIONAL BIOPSY Left 06/04/2016   INVASIVE MAMMARY CARCINOMA.   Marland Kitchen BREAST LUMPECTOMY Left 06/04/2016   INVASIVE MAMMARY CARCINOMA.   Marland Kitchen CARDIAC CATHETERIZATION    . CARDIAC CATHETERIZATION N/A 09/24/2015   Procedure: Left Heart Cath and Coronary Angiography;  Surgeon: Isaias Cowman, MD;  Location: [place] [facility];  Service: Cardiovascular;  Laterality: N/A;  . CARDIAC CATHETERIZATION N/A 09/24/2015   Procedure: Coronary Stent Intervention;  Surgeon: Isaias Cowman, MD;  Location: [place] [facility];  Service: Cardiovascular;  Laterality: N/A;  . CHOLECYSTECTOMY  11/26   08/1970  .  DILATATION & CURETTAGE/HYSTEROSCOPY WITH MYOSURE N/A 05/05/2015   Procedure: Hysteroscopy and endometrial curretage;  Surgeon: Boykin Nearing, MD;  Location: [facility];  Service: Gynecology;  Laterality: N/A;  . DILATION AND CURETTAGE OF UTERUS    . EXCISION MASS FROM NECK  Right    Dr. Tami Ribas  . EYE SURGERY  t   bil cataract 9/08  . HAMMER TOE SURGERY Right   . JOINT REPLACEMENT Bilateral 2008, 11/26/ 2012   Partial Knee Replacements  . PARTIAL  MASTECTOMY WITH NEEDLE LOCALIZATION Left 06/04/2016   Procedure: PARTIAL MASTECTOMY WITH NEEDLE LOCALIZATION;  Surgeon: Leonie Green, MD;  Location: [facility];  Service: General;  Laterality: Left;  . SCLERAL BUCKLE  02/16/2011   Procedure: SCLERAL BUCKLE;  Surgeon: Hayden Pedro, MD;  Location: [place];  Service: Ophthalmology;  Laterality: Left;  Scleral Buckle Left Eye with Headscope Laser  . SENTINEL NODE BIOPSY Left 06/04/2016   Procedure: SENTINEL NODE BIOPSY;  Surgeon: Leonie Green, MD;  Location: [facility];  Service: General;  Laterality: Left;    Prior to Admission medications   Medication Sig Start Date End Date Taking? Authorizing Provider  Acetaminophen (MAPAP) 500 MG coapsule Take by mouth.    [provider]  apixaban (ELIQUIS) 5 MG TABS tablet Take 5 mg by mouth 2 (two) times daily.    [provider]  aspirin 81 MG chewable tablet Chew 81 mg by mouth daily.    [provider]  Besifloxacin HCl (BESIVANCE) 0.6 % SUSP Place 1 drop into the left eye See admin instructions. AFTER EYE INJECTION    [provider]  brimonidine (ALPHAGAN) 0.2 % ophthalmic solution Place 1 drop into the left eye 2 (two) times daily.    [provider]  Cholecalciferol (VITAMIN D-1000 MAX ST) 1000 units tablet Take 1,000 Units by mouth daily.    [provider]  diltiazem (CARDIZEM CD) 300 MG 24 hr capsule Take 300 mg by mouth daily.    [provider]  FLUARIX QUADRIVALENT 0.5 ML injection ADM 0.5ML IM UTD 01/07/17   [provider]  Fluticasone-Salmeterol (ADVAIR) 100-50 MCG/DOSE AEPB Inhale 1 puff into the lungs 2 (two) times daily as needed (for asthma.).    [provider]  isosorbide mononitrate (IMDUR) 30 MG 24 hr tablet Take 30 mg by mouth daily.    [provider]  letrozole (FEMARA) 2.5 MG tablet TAKE 1 TABLET BY MOUTH ONCE DAILY 09/17/17   Lloyd Huger, MD  levothyroxine (SYNTHROID,  LEVOTHROID) 150 MCG tablet Take 150 mcg by mouth daily.      [provider]  Melatonin 5 MG TABS Take 5 mg by mouth at bedtime.    [provider]  metoprolol (TOPROL-XL) 100 MG 24 hr tablet Take 50 mg by mouth 2 (two) times daily.     [provider]  nitroGLYCERIN (NITROSTAT) 0.4 MG SL tablet Place 0.4 mg under the tongue every 5 (five) minutes as needed for chest pain. Maximum 3 doses, If no relief call md or 911.    [provider]  oxybutynin (DITROPAN) 5 MG tablet Take 5 mg by mouth daily.    [provider]  potassium chloride (K-DUR) 10 MEQ tablet Take 1 tablet (10 mEq total) by mouth daily. 09/25/15   Paraschos, Alexander, MD  RABEprazole (ACIPHEX) 20 MG tablet Take 20 mg by mouth daily.      [provider]  sertraline (ZOLOFT) 50 MG tablet Take 50 mg  by mouth at bedtime.      [provider]  simvastatin (ZOCOR) 40 MG tablet Take 40 mg by mouth at bedtime.      [provider]  torsemide (DEMADEX) 20 MG tablet Take 20 mg by mouth daily as needed.     [provider]  vitamin B-12 (CYANOCOBALAMIN) 100 MCG tablet Take 100 mcg by mouth daily.    [provider]    Allergies Codeine  Family History  Problem Relation Age of Onset  . Breast cancer Mother 9  . Heart disease Father   . Breast cancer Paternal Aunt 73  . Breast cancer Cousin   . Anesthesia problems Neg Hx   . Hypotension Neg Hx   . Malignant hyperthermia Neg Hx   . Pseudochol deficiency Neg Hx     Social History Social History   Tobacco Use  . Smoking status: Never Smoker  . Smokeless tobacco: Never Used  Substance Use Topics  . Alcohol use: No  . Drug use: No    Review of Systems Constitutional: No fever/chills Eyes: No visual changes. ENT: No sore throat. Cardiovascular: Positive for chest pain and palpitations.  Respiratory: Denies shortness of breath. Gastrointestinal: No abdominal pain.  No nausea, no vomiting.   No diarrhea.   Genitourinary: Negative for dysuria. Musculoskeletal: Negative for back pain. Skin: Negative for rash. Neurological: Negative for headaches, focal weakness or numbness.  ____________________________________________   PHYSICAL EXAM:  VITAL SIGNS: ED Triage Vitals  Enc Vitals Group     BP 10/23/17 1916 138/80     Pulse Rate 10/23/17 1916 76     Resp 10/23/17 1916 17     Temp 10/23/17 1916 97.7 F (36.5 C)     Temp Source 10/23/17 1916 Oral     SpO2 10/23/17 1916 96 %     Weight 10/23/17 1920 195 lb (88.5 kg)     Height 10/23/17 1920 5\' 3"  (1.6 m)     Head Circumference --      Peak Flow --      Pain Score 10/23/17 1920 0   Constitutional: Alert and oriented.  Eyes: Conjunctivae are normal.  ENT      Head: Normocephalic and atraumatic.      Nose: No congestion/rhinnorhea.      Mouth/Throat: Mucous membranes are moist.      Neck: No stridor. Hematological/Lymphatic/Immunilogical: No cervical lymphadenopathy. Cardiovascular: Normal rate, regular rhythm.  No murmurs, rubs, or gallops.  Respiratory: Normal respiratory effort without tachypnea nor retractions. Breath sounds are clear and equal bilaterally. No wheezes/rales/rhonchi. Gastrointestinal: Soft and non tender. No rebound. No guarding.  Genitourinary: Deferred Musculoskeletal: Normal range of motion in all extremities. No lower extremity edema. Neurologic:  Normal speech and language. No gross focal neurologic deficits are appreciated.  Skin:  Skin is warm, dry and intact. No rash noted. Psychiatric: Mood and affect are normal. Speech and behavior are normal. Patient exhibits appropriate insight and judgment.  ____________________________________________    LABS (pertinent positives/negatives)  Trop 0.03->0.07 BMP na 141, k 3.3, glu 107, cr 1.21 CBC wbc 10.6, hgb 14.0, plt 252   ____________________________________________   EKG  I, Nance Pear, attending physician, personally viewed and  interpreted this EKG  EKG Time: 1913 Rate: 75 Rhythm: sinus rhythm Axis: left axis deviation Intervals: qtc 425 QRS: narrow ST changes: no st elevation Impression: abnormal ekg   ____________________________________________    RADIOLOGY  CXR No acute findings  ____________________________________________   PROCEDURES  Procedures  ____________________________________________  INITIAL IMPRESSION / ASSESSMENT AND PLAN / ED COURSE  Pertinent labs & imaging results that were available during my care of the patient were reviewed by me and considered in my medical decision making (see chart for details).   Patient presented to the emergency department today because of concerns for chest pain and atrial fibrillation.  By the time she arrived to the emergency department she was normal sinus rhythm.  She did describe a pain that was different with this episode than her normal episodes.  Initial troponin 0 0.03.  This was repeated and elevated to 0.07.  Given elevation will discuss with hospitalist for admission.  Discussed plan with patient.  _____________________________________   FINAL CLINICAL IMPRESSION(S) / ED DIAGNOSES  Final diagnoses:  Chest pain, unspecified type  Elevated troponin  Atrial fibrillation, unspecified type [facility])     Note: This dictation was prepared with Dragon dictation. Any transcriptional errors that result from this process are unintentional     Nance Pear, MD 10/23/17 [phone]

## 2017-10-23 NOTE — ED Triage Notes (Addendum)
Pt arrived via EMS from home with c/o pain that started on the top of her head and radiated to below both ears, down bilateral jaw line to both arms and across chest; pt says it felt like her heart was beating fast and irregular; history of AFib and was showing same on monitor with rate 112-180; pt was given 1.5mg  Metoprolol IV in route and is currently pain free; HR 76; alert and oriented x 3; talking in complete coherent sentences;

## 2017-10-23 NOTE — ED Notes (Signed)
Pt resting quietly; no complaints or requests; brother at bedside

## 2017-10-23 NOTE — ED Notes (Signed)
Pt given warm blanket for comfort; call bell in reach; brother at bedside

## 2017-10-23 NOTE — ED Notes (Signed)
Triage stopped for pt to get up to toilet in room to void; steady gait; back to stretcher without incident

## 2017-10-23 NOTE — ED Notes (Signed)
Dr. Goodman at bedside.  

## 2017-10-23 NOTE — ED Notes (Signed)
Assisted patient to the restroom. LJA

## 2017-10-23 NOTE — ED Notes (Signed)
Pt given water to drink as allowed by MD; pt understands the plan of care is to have a repeat troponin before disposition decision

## 2017-10-24 ENCOUNTER — Inpatient Hospital Stay
Admit: 2017-10-24 | Discharge: 2017-10-24 | Disposition: A | Discharge status: Home / Self Care | Payer: Commercial Managed Care - HMO | Attending: Internal Medicine | Admitting: Internal Medicine

## 2017-10-24 ENCOUNTER — Other Ambulatory Visit: Payer: Self-pay

## 2017-10-24 DIAGNOSIS — I200 Unstable angina: Secondary | ICD-10-CM | POA: Diagnosis not present

## 2017-10-24 DIAGNOSIS — I480 Paroxysmal atrial fibrillation: Secondary | ICD-10-CM | POA: Diagnosis present

## 2017-10-24 DIAGNOSIS — Z853 Personal history of malignant neoplasm of breast: Secondary | ICD-10-CM | POA: Diagnosis not present

## 2017-10-24 DIAGNOSIS — Z7951 Long term (current) use of inhaled steroids: Secondary | ICD-10-CM | POA: Diagnosis not present

## 2017-10-24 DIAGNOSIS — F329 Major depressive disorder, single episode, unspecified: Secondary | ICD-10-CM | POA: Diagnosis present

## 2017-10-24 DIAGNOSIS — I9789 Other postprocedural complications and disorders of the circulatory system, not elsewhere classified: Secondary | ICD-10-CM | POA: Diagnosis not present

## 2017-10-24 DIAGNOSIS — Z9049 Acquired absence of other specified parts of digestive tract: Secondary | ICD-10-CM | POA: Diagnosis not present

## 2017-10-24 DIAGNOSIS — G4733 Obstructive sleep apnea (adult) (pediatric): Secondary | ICD-10-CM | POA: Diagnosis present

## 2017-10-24 DIAGNOSIS — K219 Gastro-esophageal reflux disease without esophagitis: Secondary | ICD-10-CM | POA: Diagnosis present

## 2017-10-24 DIAGNOSIS — I4891 Unspecified atrial fibrillation: Secondary | ICD-10-CM | POA: Diagnosis not present

## 2017-10-24 DIAGNOSIS — E876 Hypokalemia: Secondary | ICD-10-CM | POA: Diagnosis present

## 2017-10-24 DIAGNOSIS — Z79899 Other long term (current) drug therapy: Secondary | ICD-10-CM | POA: Diagnosis not present

## 2017-10-24 DIAGNOSIS — Z79890 Hormone replacement therapy (postmenopausal): Secondary | ICD-10-CM | POA: Diagnosis not present

## 2017-10-24 DIAGNOSIS — Z96653 Presence of artificial knee joint, bilateral: Secondary | ICD-10-CM | POA: Diagnosis present

## 2017-10-24 DIAGNOSIS — Z885 Allergy status to narcotic agent status: Secondary | ICD-10-CM | POA: Diagnosis not present

## 2017-10-24 DIAGNOSIS — E039 Hypothyroidism, unspecified: Secondary | ICD-10-CM | POA: Diagnosis present

## 2017-10-24 DIAGNOSIS — Z8249 Family history of ischemic heart disease and other diseases of the circulatory system: Secondary | ICD-10-CM | POA: Diagnosis not present

## 2017-10-24 DIAGNOSIS — Z803 Family history of malignant neoplasm of breast: Secondary | ICD-10-CM | POA: Diagnosis not present

## 2017-10-24 DIAGNOSIS — N179 Acute kidney failure, unspecified: Secondary | ICD-10-CM | POA: Diagnosis not present

## 2017-10-24 DIAGNOSIS — E785 Hyperlipidemia, unspecified: Secondary | ICD-10-CM | POA: Diagnosis present

## 2017-10-24 DIAGNOSIS — I7777 Dissection of artery of lower extremity: Secondary | ICD-10-CM | POA: Diagnosis not present

## 2017-10-24 DIAGNOSIS — Z7901 Long term (current) use of anticoagulants: Secondary | ICD-10-CM | POA: Diagnosis not present

## 2017-10-24 DIAGNOSIS — I10 Essential (primary) hypertension: Secondary | ICD-10-CM | POA: Diagnosis not present

## 2017-10-24 DIAGNOSIS — Z955 Presence of coronary angioplasty implant and graft: Secondary | ICD-10-CM | POA: Diagnosis not present

## 2017-10-24 DIAGNOSIS — R079 Chest pain, unspecified: Secondary | ICD-10-CM | POA: Diagnosis not present

## 2017-10-24 DIAGNOSIS — R748 Abnormal levels of other serum enzymes: Secondary | ICD-10-CM | POA: Diagnosis present

## 2017-10-24 DIAGNOSIS — I25110 Atherosclerotic heart disease of native coronary artery with unstable angina pectoris: Secondary | ICD-10-CM | POA: Diagnosis not present

## 2017-10-24 DIAGNOSIS — Z9012 Acquired absence of left breast and nipple: Secondary | ICD-10-CM | POA: Diagnosis not present

## 2017-10-24 DIAGNOSIS — Z7982 Long term (current) use of aspirin: Secondary | ICD-10-CM | POA: Diagnosis not present

## 2017-10-24 DIAGNOSIS — R9431 Abnormal electrocardiogram [ECG] [EKG]: Secondary | ICD-10-CM | POA: Diagnosis not present

## 2017-10-24 LAB — BASIC METABOLIC PANEL
ANION GAP: 8 (ref 5–15)
BUN: 17 mg/dL (ref 8–23)
CALCIUM: 9.1 mg/dL (ref 8.9–10.3)
CO2: 31 mmol/L (ref 22–32)
Chloride: 103 mmol/L (ref 98–111)
Creatinine, Ser: 1.09 mg/dL — ABNORMAL HIGH (ref 0.44–1.00)
GFR calc Af Amer: 57 mL/min — ABNORMAL LOW (ref >60)
GFR calc non Af Amer: 49 mL/min — ABNORMAL LOW (ref >60)
Glucose, Bld: 108 mg/dL — ABNORMAL HIGH (ref 70–99)
Potassium: 3.4 mmol/L — ABNORMAL LOW (ref 3.5–5.1)
SODIUM: 142 mmol/L (ref 135–145)

## 2017-10-24 LAB — CBC
HCT: 40.4 % (ref 35.0–47.0)
HEMOGLOBIN: 13.7 g/dL (ref 12.0–16.0)
MCH: 26 pg (ref 26.0–34.0)
MCHC: 34 g/dL (ref 32.0–36.0)
MCV: 76.5 fL — ABNORMAL LOW (ref 80.0–100.0)
PLATELETS: 242 10*3/uL (ref 150–440)
RBC: 5.28 MIL/uL — ABNORMAL HIGH (ref 3.80–5.20)
RDW: 19.1 % — ABNORMAL HIGH (ref 11.5–14.5)
WBC: 10 10*3/uL (ref 3.6–11.0)

## 2017-10-24 LAB — HEPARIN LEVEL (UNFRACTIONATED)
HEPARIN UNFRACTIONATED: 0.34 [IU]/mL (ref 0.30–0.70)
Heparin Unfractionated: 0.59 IU/mL (ref 0.30–0.70)

## 2017-10-24 LAB — APTT
APTT: 36 s (ref 24–36)
aPTT: 26 seconds (ref 24–36)

## 2017-10-24 LAB — ECHOCARDIOGRAM COMPLETE
HEIGHTINCHES: 63 in
Weight: 3088 oz

## 2017-10-24 LAB — TROPONIN I: TROPONIN I: 0.08 ng/mL — AB (ref <0.03)

## 2017-10-24 LAB — MAGNESIUM: Magnesium: 2.1 mg/dL (ref 1.7–2.4)

## 2017-10-24 LAB — GLUCOSE, CAPILLARY: Glucose-Capillary: 103 mg/dL — ABNORMAL HIGH (ref 70–99)

## 2017-10-24 LAB — PROTIME-INR
INR: 1.03
PROTHROMBIN TIME: 13.4 s (ref 11.4–15.2)

## 2017-10-24 MED ORDER — ONDANSETRON HCL 4 MG PO TABS: 4.0000 mg | ORAL_TABLET | Freq: Four times a day (QID) | ORAL | Status: DC | PRN

## 2017-10-24 MED ORDER — PANTOPRAZOLE SODIUM 40 MG PO TBEC
40.0000 mg | DELAYED_RELEASE_TABLET | Freq: Every day | ORAL | Status: DC
  Administered 2017-10-24 – 2017-10-27 (×4): 40 mg via ORAL
  Filled 2017-10-24 (×4): qty 1

## 2017-10-24 MED ORDER — DOCUSATE SODIUM 100 MG PO CAPS
100.0000 mg | ORAL_CAPSULE | Freq: Two times a day (BID) | ORAL | Status: DC
  Administered 2017-10-24 – 2017-10-27 (×2): 100 mg via ORAL
  Filled 2017-10-24 (×7): qty 1

## 2017-10-24 MED ORDER — ATORVASTATIN CALCIUM 20 MG PO TABS
20.0000 mg | ORAL_TABLET | Freq: Every day | ORAL | Status: DC
  Administered 2017-10-24 – 2017-10-26 (×3): 20 mg via ORAL
  Filled 2017-10-24 (×3): qty 1

## 2017-10-24 MED ORDER — POTASSIUM CHLORIDE CRYS ER 20 MEQ PO TBCR
40.0000 meq | EXTENDED_RELEASE_TABLET | Freq: Every day | ORAL | Status: AC
  Administered 2017-10-24 – 2017-10-25 (×2): 40 meq via ORAL
  Filled 2017-10-24: qty 2
  Filled 2017-10-24 (×3): qty 4

## 2017-10-24 MED ORDER — LEVOTHYROXINE SODIUM 50 MCG PO TABS
150.0000 ug | ORAL_TABLET | Freq: Every day | ORAL | Status: DC
  Administered 2017-10-24 – 2017-10-27 (×4): 150 ug via ORAL
  Filled 2017-10-24: qty 1
  Filled 2017-10-24 (×2): qty 3
  Filled 2017-10-24: qty 1

## 2017-10-24 MED ORDER — HEPARIN (PORCINE) IN NACL 100-0.45 UNIT/ML-% IJ SOLN
850.0000 [IU]/h | INTRAMUSCULAR | Status: DC
  Administered 2017-10-24: 850 [IU]/h via INTRAVENOUS

## 2017-10-24 MED ORDER — SERTRALINE HCL 50 MG PO TABS
50.0000 mg | ORAL_TABLET | Freq: Every day | ORAL | Status: DC
  Administered 2017-10-24 – 2017-10-26 (×3): 50 mg via ORAL
  Filled 2017-10-24 (×3): qty 1

## 2017-10-24 MED ORDER — ACETAMINOPHEN 650 MG RE SUPP: 650.0000 mg | Freq: Four times a day (QID) | RECTAL | Status: DC | PRN

## 2017-10-24 MED ORDER — HEPARIN BOLUS VIA INFUSION
2000.0000 [IU] | Freq: Once | INTRAVENOUS | Status: AC
  Administered 2017-10-24: 2000 [IU] via INTRAVENOUS
  Filled 2017-10-24: qty 2000

## 2017-10-24 MED ORDER — FLUTICASONE FUROATE-VILANTEROL 100-25 MCG/INH IN AEPB
1.0000 | INHALATION_SPRAY | Freq: Every day | RESPIRATORY_TRACT | Status: DC
  Administered 2017-10-24 – 2017-10-27 (×2): 1 via RESPIRATORY_TRACT
  Filled 2017-10-24: qty 28

## 2017-10-24 MED ORDER — FERROUS SULFATE 220 (44 FE) MG/5ML PO ELIX: 325.0000 mg | ORAL_SOLUTION | ORAL | Status: DC

## 2017-10-24 MED ORDER — POTASSIUM CHLORIDE CRYS ER 10 MEQ PO TBCR
10.0000 meq | EXTENDED_RELEASE_TABLET | Freq: Every day | ORAL | Status: DC
  Administered 2017-10-26 – 2017-10-27 (×2): 10 meq via ORAL
  Filled 2017-10-24 (×2): qty 1

## 2017-10-24 MED ORDER — POTASSIUM CHLORIDE ER 10 MEQ PO TBCR
10.0000 meq | EXTENDED_RELEASE_TABLET | Freq: Every day | ORAL | Status: DC
  Administered 2017-10-24: 10 meq via ORAL
  Filled 2017-10-24 (×2): qty 1

## 2017-10-24 MED ORDER — TRAZODONE HCL 50 MG PO TABS: 25.0000 mg | ORAL_TABLET | Freq: Every evening | ORAL | Status: DC | PRN

## 2017-10-24 MED ORDER — LETROZOLE 2.5 MG PO TABS
2.5000 mg | ORAL_TABLET | Freq: Every day | ORAL | Status: DC
  Administered 2017-10-24 – 2017-10-27 (×4): 2.5 mg via ORAL
  Filled 2017-10-24 (×4): qty 1

## 2017-10-24 MED ORDER — ACETAMINOPHEN 325 MG PO TABS: 650.0000 mg | ORAL_TABLET | Freq: Four times a day (QID) | ORAL | Status: DC | PRN

## 2017-10-24 MED ORDER — DILTIAZEM HCL ER COATED BEADS 180 MG PO CP24
300.0000 mg | ORAL_CAPSULE | Freq: Every day | ORAL | Status: DC
  Administered 2017-10-24 – 2017-10-27 (×4): 300 mg via ORAL
  Filled 2017-10-24 (×4): qty 1

## 2017-10-24 MED ORDER — NITROGLYCERIN 0.4 MG SL SUBL: 0.4000 mg | SUBLINGUAL_TABLET | SUBLINGUAL | Status: DC | PRN

## 2017-10-24 MED ORDER — BISACODYL 5 MG PO TBEC: 5.0000 mg | DELAYED_RELEASE_TABLET | Freq: Every day | ORAL | Status: DC | PRN

## 2017-10-24 MED ORDER — ASPIRIN 81 MG PO CHEW
81.0000 mg | CHEWABLE_TABLET | Freq: Every day | ORAL | Status: DC
  Administered 2017-10-24 – 2017-10-27 (×3): 81 mg via ORAL
  Filled 2017-10-24 (×5): qty 1

## 2017-10-24 MED ORDER — METOPROLOL SUCCINATE ER 50 MG PO TB24
50.0000 mg | ORAL_TABLET | Freq: Two times a day (BID) | ORAL | Status: DC
  Administered 2017-10-24 – 2017-10-27 (×7): 50 mg via ORAL
  Filled 2017-10-24 (×7): qty 1

## 2017-10-24 MED ORDER — VITAMIN D3 25 MCG (1000 UNIT) PO TABS
1000.0000 [IU] | ORAL_TABLET | Freq: Every day | ORAL | Status: DC
Start: 2017-10-24 — End: 2017-10-27
  Administered 2017-10-24 – 2017-10-27 (×4): 1000 [IU] via ORAL
  Filled 2017-10-24 (×5): qty 1

## 2017-10-24 MED ORDER — SIMVASTATIN 20 MG PO TABS
40.0000 mg | ORAL_TABLET | Freq: Every day | ORAL | Status: DC
  Administered 2017-10-24: 40 mg via ORAL
  Filled 2017-10-24: qty 2

## 2017-10-24 MED ORDER — ONDANSETRON HCL 4 MG/2ML IJ SOLN: 4.0000 mg | Freq: Four times a day (QID) | INTRAMUSCULAR | Status: DC | PRN

## 2017-10-24 MED ORDER — VITAMIN B-12 100 MCG PO TABS
100.0000 ug | ORAL_TABLET | Freq: Every day | ORAL | Status: DC
  Administered 2017-10-24 – 2017-10-27 (×4): 100 ug via ORAL
  Filled 2017-10-24 (×4): qty 1

## 2017-10-24 MED ORDER — MELATONIN 5 MG PO TABS
5.0000 mg | ORAL_TABLET | Freq: Every day | ORAL | Status: DC
  Administered 2017-10-24 – 2017-10-26 (×3): 5 mg via ORAL
  Filled 2017-10-24 (×5): qty 1

## 2017-10-24 NOTE — Consult Note (Signed)
ANTICOAGULATION CONSULT NOTE - Initial Consult  Pharmacy Consult for heparin drip Indication: chest pain/ACS and atrial fibrillation  Allergies  Allergen Reactions  . Codeine Other (See Comments)    Stroke like symptoms    Patient Measurements: Height: 5\' 3"  (160 cm) Weight: 193 lb (87.5 kg) IBW/kg (Calculated) : 52.4 Heparin Dosing Weight: 72.1kg  Vital Signs: Temp: 98.6 F (37 C) (08/05 0800) Temp Source: Oral (08/05 0800) BP: 140/79 (08/05 0800) Pulse Rate: 62 (08/05 0800)  Labs: Recent Labs    10/23/17 1937 10/23/17 2242 10/24/17 0153  HGB 14.0  --  13.7  HCT 42.6  --  40.4  PLT 252  --  242  CREATININE 1.21*  --  1.09*  TROPONINI 0.03* 0.07* 0.08*    Estimated Creatinine Clearance: 48.2 mL/min (A) (by C-G formula based on SCr of 1.09 mg/dL (H)).   Medical History: Past Medical History:  Diagnosis Date  . Anginal pain ([place])   . Arthritis t   knee  . Asthma    mild  . Breast cancer ([place])   . 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 Mamie Nick. at [facility] in [place]  . Depression   . Dyspnea   . Dysrhythmia   . GERD (gastroesophageal reflux disease)   . Hypertension   . Hypothyroidism   . 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  Marland Kitchen No pertinent past medical history   . Sleep apnea t   O2- 2l at bedtime and BiPap @ bedtime    Medications:  Scheduled:  . aspirin  81 mg Oral Daily  . cholecalciferol  1,000 Units Oral Daily  . diltiazem  300 mg Oral Daily  . docusate sodium  100 mg Oral BID  . [START ON 10/30/2017] ferrous sulfate  325 mg Oral Q Sun-1800  . fluticasone furoate-vilanterol  1 puff Inhalation Daily  . heparin  2,000 Units Intravenous Once  . letrozole  2.5 mg Oral Daily  . levothyroxine  150 mcg Oral QAC breakfast  . Melatonin  5 mg Oral QHS  . metoprolol succinate  50 mg Oral BID  . pantoprazole  40 mg Oral Daily  . potassium chloride  10 mEq  Oral Daily  . sertraline  50 mg Oral QHS  . simvastatin  40 mg Oral QHS  . vitamin B-12  100 mcg Oral Daily    Assessment: Patient is a 72 year old female with a hx of PAF who presents with palpations and chest pain. Patient was found to be in afib by EMS but has since converted to NSR. Pt is on apixaban PTA. Troponins found to be mildly elevated. Pharmacy consulted to bridge from apixaban to heparin drip incase a procedure needs to be done. Last dose of apixaban was 8/4 AM. Baseline APPT, HL, and INR ordered. If baseline HL elevated (which it most likely will be) will need to dose off of APTT until the two corollate.  Will give only a 1/2 bolus since on anticoag PTA, but last dose > 24hr ago  Goal of Therapy:  Heparin level 0.3-0.7 units/ml aPTT 66-102 seconds Monitor platelets by anticoagulation protocol: Yes   Plan:  Give 2000 units bolus x 1 Start heparin infusion at 850 units/hr Check anti-Xa level in 8 hours and daily while on heparin Continue to monitor H&H and platelets  Nelle Sayed D Courtnay Petrilla, Pharm.D, BCPS Clinical Pharmacist 10/24/2017,10:23 AM

## 2017-10-24 NOTE — Progress Notes (Signed)
*  PRELIMINARY RESULTS* Echocardiogram 2D Echocardiogram has been performed.  Sherrie Sport 10/24/2017, 9:17 AM

## 2017-10-24 NOTE — H&P (Signed)
Stacey Stone at [place] NAME: Stacey Stone    MR#:  [id number]  DATE OF BIRTH:  01-03-1946  DATE OF ADMISSION:  10/23/2017  PRIMARY CARE PHYSICIAN: Rusty Aus, MD   REQUESTING/REFERRING PHYSICIAN:   CHIEF COMPLAINT:   Chief Complaint  Patient presents with  . Chest Pain    HISTORY OF PRESENT ILLNESS: Stacey Stone  is a 72 y.o. female with a known history of breast cancer, status post surgery and chemotherapy, coronary artery disease, chronic atrial fibrillation and obstructive sleep apnea. Patient presented to emergency room for acute onset of palpitations and left-sided chest pain.  Patient describes his pain as severe pressure radiating to the left arm and jaw.  When paramedics arrived patient was noted to be in atrial fibrillation with heart rate in 120s to 180s.  She was given IV Cardizem during the ambulance transport.  By the time patient arrived to emergency room she was back in sinus rhythm with heart rate at 70s.  Her chest pain improved, but not completely resolved.  She states that she had similar episodes of chest pain but never with radiation to the jaw and the left arm. Blood test done emergency room are remarkable for initial troponin level of 0.03 and second troponin level elevated at 0.07.  No ST elevation per EKG.  The reminder CBC and CMP are grossly unremarkable.  Chest x-ray is within normal limits. Is admitted for further evaluation and treatment.   PAST MEDICAL HISTORY:   Past Medical History:  Diagnosis Date  . Anginal pain ([place])   . Arthritis t   knee  . Asthma    mild  . Breast cancer ([place])   . 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 Mamie Nick. at [facility] in [place]  . Depression   . Dyspnea   . Dysrhythmia   . GERD (gastroesophageal reflux disease)   . Hypertension   . Hypothyroidism   . MALT (mucosa associated lymphoid tissue) ([place]) 07/05/2014   Right  neck mass resected 01/2014.  Marland Kitchen No pertinent past medical history   . Sleep apnea t   O2- 2l at bedtime and BiPap @ bedtime    PAST SURGICAL HISTORY:  Past Surgical History:  Procedure Laterality Date  . BREAST BIOPSY Left 2/136/2018   INVASIVE MAMMARY CARCINOMA  . BREAST BIOPSY Right 05/25/2016   FIBROCYSTIC CHANGE WITH CALCIFICATIONS   . BREAST EXCISIONAL BIOPSY Left 06/04/2016   INVASIVE MAMMARY CARCINOMA.   Marland Kitchen BREAST LUMPECTOMY Left 06/04/2016   INVASIVE MAMMARY CARCINOMA.   Marland Kitchen CARDIAC CATHETERIZATION    . CARDIAC CATHETERIZATION N/A 09/24/2015   Procedure: Left Heart Cath and Coronary Angiography;  Surgeon: Isaias Cowman, MD;  Location: [facility];  Service: Cardiovascular;  Laterality: N/A;  . CARDIAC CATHETERIZATION N/A 09/24/2015   Procedure: Coronary Stent Intervention;  Surgeon: Isaias Cowman, MD;  Location: [facility];  Service: Cardiovascular;  Laterality: N/A;  . CHOLECYSTECTOMY  11/26   08/1970  . DILATATION & CURETTAGE/HYSTEROSCOPY WITH MYOSURE N/A 05/05/2015   Procedure: Hysteroscopy and endometrial curretage;  Surgeon: Boykin Nearing, MD;  Location: [facility];  Service: Gynecology;  Laterality: N/A;  . DILATION AND CURETTAGE OF UTERUS    . EXCISION MASS FROM NECK  Right    Dr. Tami Ribas  . EYE SURGERY  t   bil cataract 9/08  . HAMMER TOE SURGERY Right   . JOINT REPLACEMENT Bilateral 2008, 11/26/ 2012  Partial Knee Replacements  . PARTIAL MASTECTOMY WITH NEEDLE LOCALIZATION Left 06/04/2016   Procedure: PARTIAL MASTECTOMY WITH NEEDLE LOCALIZATION;  Surgeon: Leonie Green, MD;  Location: [facility];  Service: General;  Laterality: Left;  . SCLERAL BUCKLE  02/16/2011   Procedure: SCLERAL BUCKLE;  Surgeon: Hayden Pedro, MD;  Location: [facility];  Service: Ophthalmology;  Laterality: Left;  Scleral Buckle Left Eye with Headscope Laser  . SENTINEL NODE BIOPSY Left 06/04/2016   Procedure: SENTINEL NODE BIOPSY;  Surgeon: Leonie Green,  MD;  Location: [facility];  Service: General;  Laterality: Left;    SOCIAL HISTORY:  Social History   Tobacco Use  . Smoking status: Never Smoker  . Smokeless tobacco: Never Used  Substance Use Topics  . Alcohol use: No    FAMILY HISTORY:  Family History  Problem Relation Age of Onset  . Breast cancer Mother 20  . Heart disease Father   . Breast cancer Paternal Aunt 73  . Breast cancer Cousin   . Anesthesia problems Neg Hx   . Hypotension Neg Hx   . Malignant hyperthermia Neg Hx   . Pseudochol deficiency Neg Hx     DRUG ALLERGIES:  Allergies  Allergen Reactions  . Codeine Other (See Comments)    Stroke like symptoms    REVIEW OF SYSTEMS:   CONSTITUTIONAL: No fever, fatigue or weakness.  EYES: No blurred or double vision.  EARS, NOSE, AND THROAT: No tinnitus or ear pain.  RESPIRATORY: No cough, shortness of breath, wheezing or hemoptysis.  CARDIOVASCULAR: Positive for chest pain; no orthopnea, edema.  GASTROINTESTINAL: No nausea, vomiting, diarrhea or abdominal pain.  GENITOURINARY: No dysuria, hematuria.  ENDOCRINE: No polyuria, nocturia,  HEMATOLOGY: No bleeding SKIN: No rash or lesion. MUSCULOSKELETAL: No joint pain or arthritis.   NEUROLOGIC: No tingling, numbness, weakness.  PSYCHIATRY: No anxiety or depression.   MEDICATIONS AT HOME:  Prior to Admission medications   Medication Sig Start Date End Date Taking? Authorizing Provider  Acetaminophen (MAPAP) 500 MG coapsule Take by mouth.   Yes [provider]  apixaban (ELIQUIS) 5 MG TABS tablet Take 5 mg by mouth 2 (two) times daily.   Yes [provider]  aspirin 81 MG chewable tablet Chew 81 mg by mouth daily.   Yes [provider]  diltiazem (CARDIZEM CD) 300 MG 24 hr capsule Take 300 mg by mouth daily.   Yes [provider]  FERROUS SULFATE PO Take 325 mg by mouth once a week.   Yes [provider]  Fluticasone-Salmeterol (ADVAIR) 100-50 MCG/DOSE AEPB Inhale 1  puff into the lungs 2 (two) times daily as needed (for asthma.).   Yes [provider]  isosorbide mononitrate (IMDUR) 30 MG 24 hr tablet Take 30 mg by mouth daily.   Yes [provider]  letrozole (FEMARA) 2.5 MG tablet TAKE 1 TABLET BY MOUTH ONCE DAILY 09/17/17  Yes Lloyd Huger, MD  levothyroxine (SYNTHROID, LEVOTHROID) 150 MCG tablet Take 150 mcg by mouth daily.     Yes [provider]  Melatonin 5 MG TABS Take 5 mg by mouth at bedtime.   Yes [provider]  metoprolol (TOPROL-XL) 100 MG 24 hr tablet Take 50 mg by mouth 2 (two) times daily.    Yes [provider]  nitroGLYCERIN (NITROSTAT) 0.4 MG SL tablet Place 0.4 mg under the tongue every 5 (five) minutes as needed for chest pain. Maximum 3 doses, If no relief call md or 911.   Yes  [provider]  oxybutynin (DITROPAN) 5 MG tablet Take 5 mg by mouth daily.   Yes [provider]  potassium chloride (K-DUR) 10 MEQ tablet Take 1 tablet (10 mEq total) by mouth daily. 09/25/15  Yes Paraschos, Alexander, MD  RABEprazole (ACIPHEX) 20 MG tablet Take 20 mg by mouth daily.     Yes [provider]  sertraline (ZOLOFT) 50 MG tablet Take 50 mg by mouth at bedtime.     Yes [provider]  simvastatin (ZOCOR) 40 MG tablet Take 40 mg by mouth at bedtime.     Yes [provider]  torsemide (DEMADEX) 20 MG tablet Take 20 mg by mouth daily as needed.    Yes [provider]  Besifloxacin HCl (BESIVANCE) 0.6 % SUSP Place 1 drop into the left eye See admin instructions. AFTER EYE INJECTION    [provider]  brimonidine (ALPHAGAN) 0.2 % ophthalmic solution Place 1 drop into the left eye 2 (two) times daily.    [provider]  Cholecalciferol (VITAMIN D-1000 MAX ST) 1000 units tablet Take 1,000 Units by mouth daily.    [provider]  vitamin B-12 (CYANOCOBALAMIN) 100 MCG tablet Take 100 mcg by mouth daily.    [provider]      PHYSICAL EXAMINATION:   VITAL SIGNS: Blood pressure 130/78, pulse (!) 55, temperature 97.7 F (36.5 C), temperature source Oral, resp. rate 14, height 5\' 3"  (1.6 m), weight 88.5 kg (195 lb), SpO2 96 %.  GENERAL:  72 y.o.-year-old patient lying in the bed, currently with no acute distress.  EYES: Pupils equal, round, reactive to light and accommodation. No scleral icterus. Extraocular muscles intact.  HEENT: Head atraumatic, normocephalic. Oropharynx and nasopharynx clear.  NECK:  Supple, no jugular venous distention. No thyroid enlargement, no tenderness.  LUNGS: Normal breath sounds bilaterally, no wheezing, rales,rhonchi or crepitation. No use of accessory muscles of respiration.  CARDIOVASCULAR: S1, S2 normal. No S3/S4.  ABDOMEN: Soft, nontender, nondistended. Bowel sounds present. No organomegaly or mass.  EXTREMITIES: No pedal edema, cyanosis, or clubbing.  NEUROLOGIC: Cranial nerves II through XII are intact. Muscle strength 5/5 in all extremities. Sensation intact. PSYCHIATRIC: The patient is alert and oriented x 3.  SKIN: No obvious rash, lesion, or ulcer.   LABORATORY PANEL:   CBC Recent Labs  Lab 10/23/17 1937  WBC 10.6  HGB 14.0  HCT 42.6  PLT 252  MCV 76.5*  MCH 25.2*  MCHC 32.9  RDW 19.5*   ------------------------------------------------------------------------------------------------------------------  Chemistries  Recent Labs  Lab 10/23/17 1937  NA 141  K 3.3*  CL 104  CO2 27  GLUCOSE 107*  BUN 19  CREATININE 1.21*  CALCIUM 9.2   ------------------------------------------------------------------------------------------------------------------ estimated creatinine clearance is 43.7 mL/min (A) (by C-G formula based on SCr of 1.21 mg/dL (H)). ------------------------------------------------------------------------------------------------------------------ No results for input(s): TSH, T4TOTAL, T3FREE, THYROIDAB in the last 72 hours.  Invalid  input(s): FREET3   Coagulation profile No results for input(s): INR, PROTIME in the last 168 hours. ------------------------------------------------------------------------------------------------------------------- No results for input(s): DDIMER in the last 72 hours. -------------------------------------------------------------------------------------------------------------------  Cardiac Enzymes Recent Labs  Lab 10/23/17 1937 10/23/17 2242  TROPONINI 0.03* 0.07*   ------------------------------------------------------------------------------------------------------------------ Invalid input(s): POCBNP  ---------------------------------------------------------------------------------------------------------------  Urinalysis    Component Value Date/Time   COLORURINE YELLOW (A) 12/15/2014 1627   APPEARANCEUR CLOUDY (A) 12/15/2014 1627   LABSPEC 1.009 12/15/2014 1627   PHURINE 8.0 12/15/2014 1627   GLUCOSEU NEGATIVE 12/15/2014 1627   HGBUR NEGATIVE 12/15/2014 1627  BILIRUBINUR NEGATIVE 12/15/2014 1627   KETONESUR NEGATIVE 12/15/2014 1627   PROTEINUR NEGATIVE 12/15/2014 1627   NITRITE NEGATIVE 12/15/2014 1627   LEUKOCYTESUR 1+ (A) 12/15/2014 1627     RADIOLOGY: Dg Chest 2 View  Result Date: 10/23/2017 CLINICAL DATA:  Chest pain, atrial fibrillation EXAM: CHEST - 2 VIEW COMPARISON:  CT chest dated 11/16/2016 FINDINGS: Lungs are clear.  No pleural effusion or pneumothorax. The heart is normal in size. Visualized osseous structures are within normal limits. IMPRESSION: Normal chest radiographs. Electronically Signed   By: Julian Hy M.D.   On: 10/23/2017 20:13    EKG: Orders placed or performed during the hospital encounter of 10/23/17  . EKG 12-Lead  . EKG 12-Lead  . EKG 12-Lead  . EKG 12-Lead  . ED EKG within 10 minutes  . ED EKG within 10 minutes    IMPRESSION AND PLAN:  1.  Unstable angina.  We will continue treatment with aspirin and nitroglycerin as  needed.  Continue to monitor patient on telemetry and follow troponin levels.  Will check 2D echo for evaluation heart function.  Cardiology is consulted for further evaluation and treatment.  2.  A. fib with RVR, reversed to sinus rhythm status post Cardizem IV.  Continue treatment with Cardizem p.o.. We will hold Eliquis for now, in case patient may need to undergo cardiac cath procedure. 3.  Hypertension, stable, continue home medications. 4.  CAD.  Most recent cardiac cath was done 2 years ago and it showed: -Two-vessel coronary artery disease with chronically occluded proximal RCA and high-grade 90% stenosis D1 - Normal left ventricular function - Successful DES D1 with 2.25 x 12 mm Xience Alpine stent Will continue medical treatment and plan further management per cardiology.   All the records are reviewed and case discussed with ED provider. Management plans discussed with the patient, who is in agreement.  CODE STATUS: Code Status History    Date Active Date Inactive Code Status Order ID Comments User Context   09/24/2015 0905 09/25/2015 1305 Full Code [id number]  Isaias Cowman, MD Inpatient   12/15/2014 1750 12/16/2014 1530 Full Code [id number]  Aldean Jewett, MD Inpatient    Advance Directive Documentation     Most Recent Value  Type of Advance Directive  Healthcare Power of Attorney, Living will  Pre-existing out of facility DNR order (yellow form or pink MOST form)  -  "MOST" Form in Place?  -       TOTAL TIME TAKING CARE OF THIS PATIENT:13minutes.    Amelia Jo M.D on 10/24/2017 at 12:24 AM  Between 7am to 6pm - Pager - [phone]  After 6pm go to [email or web address] - password [email or web address] [facility]  Office  [phone]  CC: Primary care physician; Rusty Aus, MD

## 2017-10-24 NOTE — Consult Note (Signed)
ANTICOAGULATION CONSULT NOTE - Initial Consult  Pharmacy Consult for heparin drip Indication: chest pain/ACS and atrial fibrillation  Allergies  Allergen Reactions  . Codeine Other (See Comments)    Stroke like symptoms    Patient Measurements: Height: 5\' 3"  (160 cm) Weight: 193 lb (87.5 kg) IBW/kg (Calculated) : 52.4 Heparin Dosing Weight: 72.1kg  Vital Signs: Temp: 98.2 F (36.8 C) (08/05 1923) Temp Source: Oral (08/05 1923) BP: 129/80 (08/05 1923) Pulse Rate: 60 (08/05 1923)  Labs: Recent Labs    10/23/17 1937 10/23/17 2242 10/24/17 0153 10/24/17 0951  HGB 14.0  --  13.7  --   HCT 42.6  --  40.4  --   PLT 252  --  242  --   APTT  --   --   --  26  LABPROT  --   --   --  13.4  INR  --   --   --  1.03  HEPARINUNFRC  --   --   --  0.59  CREATININE 1.21*  --  1.09*  --   TROPONINI 0.03* 0.07* 0.08*  --     Estimated Creatinine Clearance: 48.2 mL/min (A) (by C-G formula based on SCr of 1.09 mg/dL (H)).   Medical History: Past Medical History:  Diagnosis Date  . Anginal pain ([place])   . Arthritis t   knee  . Asthma    mild  . Breast cancer ([place])   . 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 Mamie Nick. at [facility] in [place]  . Depression   . Dyspnea   . Dysrhythmia   . GERD (gastroesophageal reflux disease)   . Hypertension   . Hypothyroidism   . MALT (mucosa associated lymphoid tissue) ([place]) 07/05/2014   Right neck mass resected 01/2014.  Marland Kitchen No pertinent past medical history   . Sleep apnea t   O2- 2l at bedtime and BiPap @ bedtime    Medications:  Scheduled:  . aspirin  81 mg Oral Daily  . atorvastatin  20 mg Oral q1800  . cholecalciferol  1,000 Units Oral Daily  . diltiazem  300 mg Oral Daily  . docusate sodium  100 mg Oral BID  . [START ON 10/30/2017] ferrous sulfate  325 mg Oral Q Sun-1800  . fluticasone furoate-vilanterol  1 puff Inhalation Daily  . letrozole  2.5 mg Oral Daily  . levothyroxine  150 mcg Oral  QAC breakfast  . Melatonin  5 mg Oral QHS  . metoprolol succinate  50 mg Oral BID  . pantoprazole  40 mg Oral Daily  . potassium chloride  40 mEq Oral Daily  . [START ON 10/26/2017] potassium chloride  10 mEq Oral Daily  . sertraline  50 mg Oral QHS  . vitamin B-12  100 mcg Oral Daily    Assessment: Patient is a 71 year old female with a hx of PAF who presents with palpations and chest pain. Patient was found to be in afib by EMS but has since converted to NSR. Pt is on apixaban PTA. Troponins found to be mildly elevated. Pharmacy consulted to bridge from apixaban to heparin drip incase a procedure needs to be done. Last dose of apixaban was 8/4 AM. Baseline HL was 26 seconds and the f/u level at 1902 is 36 seconds. Baseline heparin level was 0.59 IU/mL and f/u level at 0.34IU.mL. Because there was no baseline aPTT elevation I will dose based on the heparin level only.  Goal of Therapy:  Heparin level 0.3-0.7 units/ml Monitor platelets by anticoagulation protocol: Yes   Plan:  Continue heparin infusion at 850 units/hr Check anti-Xa level in 8 hours and daily while on heparin Continue to monitor H&H and platelets  Vallery Sa, Pharm.D Clinical Pharmacist 10/24/2017,7:27 PM

## 2017-10-24 NOTE — Consult Note (Signed)
[facility]  CARDIOLOGY CONSULT NOTE  Patient ID: Stacey Stone MRN: [id number] DOB/AGE: 1945-04-10 72 y.o.  Admit date: 10/23/2017 Referring Physician Dr. Amelia Jo Primary Physician Dr. Emily Filbert Primary Cardiologist Dr. Isaias Cowman  Reason for Consultation Chest pain, elevated troponin  HPI: Stacey Stone is a 72 year old female with a past medical history significant for paroxysmal atrial fibrillation, on Eliquis, coronary artery disease, s/p stent on 09/24/15, chronically occluded RCA, hypertension, hyperlipidemia and obstructive sleep apnea who presented to the ED on 10/23/2017 with chest pain, palpitations, jaw pain, and bilateral arm pain.  Patient reports that she noticed severe pain in her bilateral jaw and the back of her head which started around 4:30 PM on 10/23/2017.  She also noticed chest discomfort and palpitations and radiation to her right and left arm.  The pain persisted for several hours, which warranted a trip to the ED.  She had symptoms similar to these prior to her previous stent in 2017.  Today, the patient reports that her jaw pain has subsided.  She still has left-sided chest discomfort and soreness.  She denies any palpitations at this time.  Also admits to chronic bilateral peripheral edema.  On Eliquis 5 mg for atrial fibrillation, with no bruising or bleeding.  Denies any lightheadedness, dizziness, nausea, vomiting, or diarrhea.  She is followed in outpatient cardiology by Dr. Saralyn Pilar.  Myocardial perfusion SPECT on 08/03/17 showed small perfusion abnormality of mild intensity in the lateral region. Xience stent in D1 on 09/24/15. Chronically occluded RCA by cath on 01/18/06. Echocardiogram on 07/21/17 showed normal left ventricular systolic function with EF of 55%, with mild LVH, mild MR, mild TR, mild AR, and mild PR.  Review of systems complete and found to be negative unless listed above     Past Medical History:  Diagnosis Date  . Anginal pain  ([place])   . Arthritis t   knee  . Asthma    mild  . Breast cancer ([place])   . 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 Mamie Nick. at [facility] in [place]  . Depression   . Dyspnea   . Dysrhythmia   . GERD (gastroesophageal reflux disease)   . Hypertension   . Hypothyroidism   . 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  Marland Kitchen No pertinent past medical history   . Sleep apnea t   O2- 2l at bedtime and BiPap @ bedtime    Past Surgical History:  Procedure Laterality Date  . BREAST BIOPSY Left 2/136/2018   INVASIVE MAMMARY CARCINOMA  . BREAST BIOPSY Right 05/25/2016   FIBROCYSTIC CHANGE WITH CALCIFICATIONS   . BREAST EXCISIONAL BIOPSY Left 06/04/2016   INVASIVE MAMMARY CARCINOMA.   Marland Kitchen BREAST LUMPECTOMY Left 06/04/2016   INVASIVE MAMMARY CARCINOMA.   Marland Kitchen CARDIAC CATHETERIZATION    . CARDIAC CATHETERIZATION N/A 09/24/2015   Procedure: Left Heart Cath and Coronary Angiography;  Surgeon: Isaias Cowman, MD;  Location: [place] [facility];  Service: Cardiovascular;  Laterality: N/A;  . CARDIAC CATHETERIZATION N/A 09/24/2015   Procedure: Coronary Stent Intervention;  Surgeon: Isaias Cowman, MD;  Location: [place] [facility];  Service: Cardiovascular;  Laterality: N/A;  . CHOLECYSTECTOMY  11/26   08/1970  . DILATATION & CURETTAGE/HYSTEROSCOPY WITH MYOSURE N/A 05/05/2015   Procedure: Hysteroscopy and endometrial curretage;  Surgeon: Boykin Nearing, MD;  Location: [facility];  Service: Gynecology;  Laterality: N/A;  . DILATION AND CURETTAGE OF UTERUS    .  EXCISION MASS FROM NECK  Right    Dr. Tami Ribas  . EYE SURGERY  t   bil cataract 9/08  . HAMMER TOE SURGERY Right   . JOINT REPLACEMENT Bilateral 2008, 11/26/ 2012   Partial Knee Replacements  . PARTIAL MASTECTOMY WITH NEEDLE LOCALIZATION Left 06/04/2016   Procedure: PARTIAL MASTECTOMY WITH NEEDLE LOCALIZATION;  Surgeon: Leonie Green, MD;   Location: [facility];  Service: General;  Laterality: Left;  . SCLERAL BUCKLE  02/16/2011   Procedure: SCLERAL BUCKLE;  Surgeon: Hayden Pedro, MD;  Location: [facility];  Service: Ophthalmology;  Laterality: Left;  Scleral Buckle Left Eye with Headscope Laser  . SENTINEL NODE BIOPSY Left 06/04/2016   Procedure: SENTINEL NODE BIOPSY;  Surgeon: Leonie Green, MD;  Location: [facility];  Service: General;  Laterality: Left;    Medications Prior to Admission  Medication Sig Dispense Refill Last Dose  . Acetaminophen (MAPAP) 500 MG coapsule Take by mouth.   prn at prn  . apixaban (ELIQUIS) 5 MG TABS tablet Take 5 mg by mouth 2 (two) times daily.   10/23/2017 at 0800  . aspirin 81 MG chewable tablet Chew 81 mg by mouth daily.   Past Month at Unknown time  . diltiazem (CARDIZEM CD) 300 MG 24 hr capsule Take 300 mg by mouth daily.   10/23/2017 at 0800  . FERROUS SULFATE PO Take 325 mg by mouth once a week.   Past Week at Unknown time  . Fluticasone-Salmeterol (ADVAIR) 100-50 MCG/DOSE AEPB Inhale 1 puff into the lungs 2 (two) times daily as needed (for asthma.).   prn at prn  . isosorbide mononitrate (IMDUR) 30 MG 24 hr tablet Take 30 mg by mouth daily.   10/23/2017 at 0800  . letrozole (FEMARA) 2.5 MG tablet TAKE 1 TABLET BY MOUTH ONCE DAILY 30 tablet 11 10/23/2017 at Unknown time  . levothyroxine (SYNTHROID, LEVOTHROID) 150 MCG tablet Take 150 mcg by mouth daily.     10/23/2017 at 0600  . Melatonin 5 MG TABS Take 5 mg by mouth at bedtime.   10/22/2017 at Unknown time  . metoprolol (TOPROL-XL) 100 MG 24 hr tablet Take 50 mg by mouth 2 (two) times daily.    10/23/2017 at 1800  . nitroGLYCERIN (NITROSTAT) 0.4 MG SL tablet Place 0.4 mg under the tongue every 5 (five) minutes as needed for chest pain. Maximum 3 doses, If no relief call md or 911.   10/23/2017 at prn  . oxybutynin (DITROPAN) 5 MG tablet Take 5 mg by mouth daily.   10/22/2017 at 2000  . potassium chloride (K-DUR) 10 MEQ tablet Take 1 tablet (10 mEq total) by  mouth daily. 30 tablet 5 10/23/2017 at Unknown time  . RABEprazole (ACIPHEX) 20 MG tablet Take 20 mg by mouth daily.     10/23/2017 at 0800  . sertraline (ZOLOFT) 50 MG tablet Take 50 mg by mouth at bedtime.     10/22/2017 at Unknown time  . simvastatin (ZOCOR) 40 MG tablet Take 40 mg by mouth at bedtime.     10/22/2017 at Unknown time  . torsemide (DEMADEX) 20 MG tablet Take 20 mg by mouth daily as needed.    10/23/2017 at 0800  . Besifloxacin HCl (BESIVANCE) 0.6 % SUSP Place 1 drop into the left eye See admin instructions. AFTER EYE INJECTION   Not Taking at Unknown time  . brimonidine (ALPHAGAN) 0.2 % ophthalmic solution Place 1 drop into the left eye 2 (two) times daily.   Not  Taking at Unknown time  . Cholecalciferol (VITAMIN D-1000 MAX ST) 1000 units tablet Take 1,000 Units by mouth daily.   Not Taking at Unknown time  . vitamin B-12 (CYANOCOBALAMIN) 100 MCG tablet Take 100 mcg by mouth daily.   Not Taking at Unknown time   Social History   Socioeconomic History  . Marital status: Widowed    Spouse name: Not on file  . Number of children: Not on file  . Years of education: Not on file  . Highest education level: Not on file  Occupational History  . Not on file  Social Needs  . Financial resource strain: Not on file  . Food insecurity:    Worry: Not on file    Inability: Not on file  . Transportation needs:    Medical: Not on file    Non-medical: Not on file  Tobacco Use  . Smoking status: Never Smoker  . Smokeless tobacco: Never Used  Substance and Sexual Activity  . Alcohol use: No  . Drug use: No  . Sexual activity: Not on file  Lifestyle  . Physical activity:    Days per week: Not on file    Minutes per session: Not on file  . Stress: Not on file  Relationships  . Social connections:    Talks on phone: Not on file    Gets together: Not on file    Attends religious service: Not on file    Active member of club or organization: Not on file    Attends meetings of clubs or  organizations: Not on file    Relationship status: Not on file  . Intimate partner violence:    Fear of current or ex partner: Not on file    Emotionally abused: Not on file    Physically abused: Not on file    Forced sexual activity: Not on file  Other Topics Concern  . Not on file  Social History Narrative  . Not on file    Family History  Problem Relation Age of Onset  . Breast cancer Mother 94  . Heart disease Father   . Breast cancer Paternal Aunt 73  . Breast cancer Cousin   . Anesthesia problems Neg Hx   . Hypotension Neg Hx   . Malignant hyperthermia Neg Hx   . Pseudochol deficiency Neg Hx       Review of systems complete and found to be negative unless listed above      PHYSICAL EXAM  General: Well developed, well nourished, in no acute distress HEENT:  Normocephalic and atramatic Neck:  No JVD.  Lungs: Clear bilaterally to auscultation and percussion. Heart: HRRR . Normal S1 and S2 without gallops or murmurs.  Abdomen: Bowel sounds are positive, abdomen soft and non-tender  Msk:  Back normal. Normal strength and tone for age. Extremities: No clubbing, cyanosis or edema.   Neuro: Alert and oriented X 3. Psych:  Good affect, responds appropriately  Labs:   Lab Results  Component Value Date   WBC 10.0 10/24/2017   HGB 13.7 10/24/2017   HCT 40.4 10/24/2017   MCV 76.5 (L) 10/24/2017   PLT 242 10/24/2017    Recent Labs  Lab 10/24/17 0153  NA 142  K 3.4*  CL 103  CO2 31  BUN 17  CREATININE 1.09*  CALCIUM 9.1  GLUCOSE 108*   Lab Results  Component Value Date   CKTOTAL 45 02/05/2014   CKMB < 0.5 (L) 02/05/2014   TROPONINI 0.08 (HH)  10/24/2017   No results found for: CHOL No results found for: HDL No results found for: LDLCALC No results found for: TRIG No results found for: CHOLHDL No results found for: LDLDIRECT    Radiology: Dg Chest 2 View  Result Date: 10/23/2017 CLINICAL DATA:  Chest pain, atrial fibrillation EXAM: CHEST - 2 VIEW  COMPARISON:  CT chest dated 11/16/2016 FINDINGS: Lungs are clear.  No pleural effusion or pneumothorax. The heart is normal in size. Visualized osseous structures are within normal limits. IMPRESSION: Normal chest radiographs. Electronically Signed   By: Julian Hy M.D.   On: 10/23/2017 20:13    EKG: Normal sinus rhythm; rate 60  ASSESSMENT AND PLAN:  1.  Unstable angina (troponin: .03, .07, .08)  - Will proceed with cardiac cath, tentatively scheduled for 10/25/17  - Must be off Eliquis 48 hours prior; last dose was morning of 10/23/17  - Echocardiogram ordered 2. Atrial fibrillation   - Currently in normal sinus rhythm, rate well controlled at 60  - Continue cardizem 300mg , metoprolol 50mg  BID  - Remain off of Eliquis prior to cath  3. Hypertension   - Continue current medication regimen  4. Hyperlipidemia   - Continue simvastatin 40mg  5. Acute kidney injury (creatinine: 1.21, 1.09)  - IV fluids  - Continue to monitor  6. Hypokalemia (3.3, 3.4)   - Continue to monitor      The history, physical exam findings, and plan of action were all discussed with Dr. Bartholome Bill and all decision-making was made in collaboration.   Signed: Avie Arenas PA-C 10/24/2017, 8:23 AM

## 2017-10-24 NOTE — Progress Notes (Signed)
Patient ID: Stacey Stone, female   DOB: [birth year], 72 y.o.   MRN: [id number]  ACP note  Patient and friend at the bedside.  Diagnosis: Unstable angina, atrial fibrillation with rapid ventricular response, hypertension, history of CAD.  History of sleep apnea, hypertension, GERD, depression, history of breast cancer  Patient is a full code.  Plan.  Cardiac catheterization set up for tomorrow.  Heparin drip for today.  Hold Eliquis.  Heart rate better controlled with metoprolol and Cardizem. Length of stay will depend  on cardiac catheterization results.  Time spent on ACP discussion 25 minutes Dr. Loletha Grayer

## 2017-10-24 NOTE — Progress Notes (Signed)
Simvastatin 40mg  changed to atrovastatin 20mg  due to drug interaction with diltiazem and increased risk of rhabdo per protocol  Ramond Dial, Pharm.D, BCPS Clinical Pharmacist

## 2017-10-25 ENCOUNTER — Encounter: Payer: Self-pay | Admitting: *Deleted

## 2017-10-25 ENCOUNTER — Encounter
Admission: EM | Disposition: A | Discharge status: Home / Self Care | Payer: Self-pay | Source: Home / Self Care | Attending: Internal Medicine

## 2017-10-25 HISTORY — PX: LEFT HEART CATH AND CORONARY ANGIOGRAPHY: CATH118249

## 2017-10-25 LAB — GLUCOSE, CAPILLARY: Glucose-Capillary: 112 mg/dL — ABNORMAL HIGH (ref 70–99)

## 2017-10-25 LAB — CBC
HCT: 40.6 % (ref 35.0–47.0)
Hemoglobin: 13.3 g/dL (ref 12.0–16.0)
MCH: 25.3 pg — ABNORMAL LOW (ref 26.0–34.0)
MCHC: 32.7 g/dL (ref 32.0–36.0)
MCV: 77.4 fL — AB (ref 80.0–100.0)
Platelets: 222 10*3/uL (ref 150–440)
RBC: 5.25 MIL/uL — AB (ref 3.80–5.20)
RDW: 19 % — ABNORMAL HIGH (ref 11.5–14.5)
WBC: 8.1 10*3/uL (ref 3.6–11.0)

## 2017-10-25 LAB — HEPARIN LEVEL (UNFRACTIONATED): HEPARIN UNFRACTIONATED: 0.34 [IU]/mL (ref 0.30–0.70)

## 2017-10-25 SURGERY — LEFT HEART CATH AND CORONARY ANGIOGRAPHY
Anesthesia: Moderate Sedation

## 2017-10-25 MED ORDER — SODIUM CHLORIDE 0.9 % WEIGHT BASED INFUSION
3.0000 mL/kg/h | INTRAVENOUS | Status: DC
  Administered 2017-10-25: 3 mL/kg/h via INTRAVENOUS

## 2017-10-25 MED ORDER — SODIUM CHLORIDE 0.9% FLUSH: 3.0000 mL | Freq: Two times a day (BID) | INTRAVENOUS | Status: DC

## 2017-10-25 MED ORDER — MIDAZOLAM HCL 2 MG/2ML IJ SOLN
INTRAMUSCULAR | Status: AC
  Filled 2017-10-25: qty 2

## 2017-10-25 MED ORDER — HEPARIN (PORCINE) IN NACL 1000-0.9 UT/500ML-% IV SOLN
INTRAVENOUS | Status: AC
  Filled 2017-10-25: qty 1000

## 2017-10-25 MED ORDER — SODIUM CHLORIDE 0.9 % IV SOLN: 250.0000 mL | INTRAVENOUS | Status: DC | PRN

## 2017-10-25 MED ORDER — SODIUM CHLORIDE 0.9 % WEIGHT BASED INFUSION: 1.0000 mL/kg/h | INTRAVENOUS | Status: DC

## 2017-10-25 MED ORDER — LIDOCAINE HCL (PF) 1 % IJ SOLN
INTRAMUSCULAR | Status: AC
  Filled 2017-10-25: qty 30

## 2017-10-25 MED ORDER — FENTANYL CITRATE (PF) 100 MCG/2ML IJ SOLN
INTRAMUSCULAR | Status: AC
  Filled 2017-10-25: qty 2

## 2017-10-25 MED ORDER — FENTANYL CITRATE (PF) 100 MCG/2ML IJ SOLN
INTRAMUSCULAR | Status: DC | PRN
  Administered 2017-10-25: 25 ug via INTRAVENOUS

## 2017-10-25 MED ORDER — SODIUM CHLORIDE 0.9% FLUSH: 3.0000 mL | INTRAVENOUS | Status: DC | PRN

## 2017-10-25 MED ORDER — ASPIRIN 81 MG PO CHEW
81.0000 mg | CHEWABLE_TABLET | ORAL | Status: AC
  Administered 2017-10-25: 81 mg via ORAL

## 2017-10-25 MED ORDER — ASPIRIN 81 MG PO CHEW: 81.0000 mg | CHEWABLE_TABLET | ORAL | Status: DC

## 2017-10-25 MED ORDER — SODIUM CHLORIDE 0.9 % WEIGHT BASED INFUSION: 3.0000 mL/kg/h | INTRAVENOUS | Status: DC

## 2017-10-25 MED ORDER — SODIUM CHLORIDE 0.9% FLUSH
3.0000 mL | Freq: Two times a day (BID) | INTRAVENOUS | Status: DC
  Administered 2017-10-25 – 2017-10-27 (×4): 3 mL via INTRAVENOUS

## 2017-10-25 MED ORDER — MIDAZOLAM HCL 2 MG/2ML IJ SOLN
INTRAMUSCULAR | Status: DC | PRN
  Administered 2017-10-25: 1 mg via INTRAVENOUS

## 2017-10-25 SURGICAL SUPPLY — 9 items
CATH INFINITI 5FR ANG PIGTAIL (CATHETERS) IMPLANT
CATH INFINITI 5FR JL4 (CATHETERS) IMPLANT
CATH INFINITI JR4 5F (CATHETERS) IMPLANT
KIT MANI 3VAL PERCEP (MISCELLANEOUS) ×3 IMPLANT
NEEDLE PERC 18GX7CM (NEEDLE) ×3 IMPLANT
PACK CARDIAC CATH (CUSTOM PROCEDURE TRAY) ×3 IMPLANT
SHEATH AVANTI 5FR X 11CM (SHEATH) ×3 IMPLANT
WIRE GUIDERIGHT .035X150 (WIRE) ×3 IMPLANT
WIRE HITORQ VERSACORE ST 145CM (WIRE) ×3 IMPLANT

## 2017-10-25 NOTE — Progress Notes (Signed)
Patient ID: Stacey Stone, female   DOB: 08/31/1945, 72 y.o.   MRN: [id number]  [employer] PROGRESS NOTE  Stacey Stone BSJ:[id number] DOB: 08/19/1945 DOA: 10/23/2017 PCP: Rusty Aus, MD  HPI/Subjective: Patient feels okay and offers no complaints.  No complaints in the groin or leg or abdomen after cardiac catheterization.  Objective: Vitals:   10/25/17 1226 10/25/17 1243  BP: (!) 144/78 139/74  Pulse: (!) 51 (!) 58  Resp: (!) 21 18  Temp:    SpO2: 95% 98%    Filed Weights   10/23/17 1920 10/24/17 0124 10/25/17 0324  Weight: 88.5 kg (195 lb) 87.5 kg (193 lb) 85.6 kg (188 lb 11.2 oz)    ROS: Review of Systems  Constitutional: Negative for chills and fever.  Eyes: Negative for blurred vision.  Respiratory: Negative for cough and shortness of breath.   Cardiovascular: Negative for chest pain.  Gastrointestinal: Positive for abdominal pain. Negative for constipation, diarrhea, nausea and vomiting.  Genitourinary: Negative for dysuria.  Musculoskeletal: Negative for joint pain.  Neurological: Negative for dizziness and headaches.   Exam: Physical Exam  Constitutional: She is oriented to person, place, and time.  HENT:  Nose: No mucosal edema.  Mouth/Throat: No oropharyngeal exudate or posterior oropharyngeal edema.  Eyes: Pupils are equal, round, and reactive to light. Conjunctivae, EOM and lids are normal.  Neck: No JVD present. Carotid bruit is not present. No edema present. No thyroid mass and no thyromegaly present.  Cardiovascular: S1 normal and S2 normal. Exam reveals no gallop.  No murmur heard. Pulses:      Dorsalis pedis pulses are 2+ on the right side, and 2+ on the left side.  Respiratory: No respiratory distress. She has no wheezes. She has no rhonchi. She has no rales.  GI: Soft. Bowel sounds are normal. There is no tenderness.  Musculoskeletal:       Right ankle: She exhibits no swelling.       Left ankle: She exhibits no swelling.   Lymphadenopathy:    She has no cervical adenopathy.  Neurological: She is alert and oriented to person, place, and time. No cranial nerve deficit.  Skin: Skin is warm. No rash noted. Nails show no clubbing.  Psychiatric: She has a normal mood and affect.      Data Reviewed: Basic Metabolic Panel: Recent Labs  Lab 10/23/17 1937 10/24/17 0153  NA 141 142  K 3.3* 3.4*  CL 104 103  CO2 27 31  GLUCOSE 107* 108*  BUN 19 17  CREATININE 1.21* 1.09*  CALCIUM 9.2 9.1  MG  --  2.1   CBC: Recent Labs  Lab 10/23/17 1937 10/24/17 0153 10/25/17 1503  WBC 10.6 10.0 8.1  HGB 14.0 13.7 13.3  HCT 42.6 40.4 40.6  MCV 76.5* 76.5* 77.4*  PLT 252 242 222   Cardiac Enzymes: Recent Labs  Lab 10/23/17 1937 10/23/17 2242 10/24/17 0153  TROPONINI 0.03* 0.07* 0.08*    CBG: Recent Labs  Lab 10/24/17 0837 10/25/17 0810  GLUCAP 103* 112*     Studies: Dg Chest 2 View  Result Date: 10/23/2017 CLINICAL DATA:  Chest pain, atrial fibrillation EXAM: CHEST - 2 VIEW COMPARISON:  CT chest dated 11/16/2016 FINDINGS: Lungs are clear.  No pleural effusion or pneumothorax. The heart is normal in size. Visualized osseous structures are within normal limits. IMPRESSION: Normal chest radiographs. Electronically Signed   By: Julian Hy M.D.   On: 10/23/2017 20:13    Scheduled Meds: . aspirin  81 mg Oral Daily  . atorvastatin  20 mg Oral q1800  . cholecalciferol  1,000 Units Oral Daily  . diltiazem  300 mg Oral Daily  . docusate sodium  100 mg Oral BID  . [START ON 10/30/2017] ferrous sulfate  325 mg Oral Q Sun-1800  . fluticasone furoate-vilanterol  1 puff Inhalation Daily  . letrozole  2.5 mg Oral Daily  . levothyroxine  150 mcg Oral QAC breakfast  . Melatonin  5 mg Oral QHS  . metoprolol succinate  50 mg Oral BID  . pantoprazole  40 mg Oral Daily  . [START ON 10/26/2017] potassium chloride  10 mEq Oral Daily  . sertraline  50 mg Oral QHS  . vitamin B-12  100 mcg Oral Daily    Continuous Infusions:  Assessment/Plan:  1. Unstable angina.  Patient asymptomatic today.  Continue aspirin metoprolol and atorvastatin.  Reattempt cardiac catheterization tomorrow. 2. 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  Continue to monitor today.  Cardiology discussed with vascular surgery and continue to monitor. 3. Essential hypertension continue metoprolol and diltiazem 4. Atrial fibrillation on metoprolol and diltiazem.  Hold Eliquis at this time for cardiac catheterization 5. Hyperlipidemia unspecified on atorvastatin 6. Hypothyroidism unspecified on levothyroxine 7. History of breast cancer on letrozole 8. Depression on Zoloft  Code Status:     Code Status Orders  (From admission, onward)        Start     Ordered   10/24/17 0113  Full code  Continuous     10/24/17 0112    Code Status History    Date Active Date Inactive Code Status Order ID Comments User Context   09/24/2015 0905 09/25/2015 1305 Full Code [id number]  Isaias Cowman, MD Inpatient   12/15/2014 1750 12/16/2014 1530 Full Code [id number]  Aldean Jewett, MD Inpatient    Advance Directive Documentation     Most Recent Value  Type of Advance Directive  Healthcare Power of Attorney, Living will  Pre-existing out of facility DNR order (yellow form or pink MOST form)  -  "MOST" Form in Place?  -     Family Communication: Daughter and son-in-law at the bedside Disposition Plan: To be determined based on cardiac cath results  Consultants:  Cardiology  Time spent: 28 minutes Case discussed with Dr. Ubaldo Glassing cardiology  Delfino Lovett [facility]

## 2017-10-25 NOTE — Progress Notes (Signed)
Patient presented to cardiac Cath Lab for attempted left heart cath.  Right femoral approach was chosen.  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  Patient is hemodynamically stable.  Cath was aborted.  Pressure held.  Will monitor for the next 24 hours and proceed with a reattempted cardiac cath via left femoral or radial approach tomorrow.  Family of patient and patient were informed of the complication.

## 2017-10-25 NOTE — Progress Notes (Signed)
Patient Name: [name] Date of Encounter: 10/25/2017  Hospital Problem List     Active Problems:   Chest pain    Patient Profile     72 year old female with history of coronary disease status post PCI of the first diagonal in 2017 with a chronically occluded RCA.  Also has a history of atrial fibrillation treated with rhythm control anticoagulated with Eliquis.  Subjective   Unstable angina with borderline serum troponin.  Inpatient Medications    . aspirin  81 mg Oral Daily  . atorvastatin  20 mg Oral q1800  . cholecalciferol  1,000 Units Oral Daily  . diltiazem  300 mg Oral Daily  . docusate sodium  100 mg Oral BID  . [START ON 10/30/2017] ferrous sulfate  325 mg Oral Q Sun-1800  . fluticasone furoate-vilanterol  1 puff Inhalation Daily  . letrozole  2.5 mg Oral Daily  . levothyroxine  150 mcg Oral QAC breakfast  . Melatonin  5 mg Oral QHS  . metoprolol succinate  50 mg Oral BID  . pantoprazole  40 mg Oral Daily  . [START ON 10/26/2017] potassium chloride  10 mEq Oral Daily  . potassium chloride SA  40 mEq Oral Daily  . sertraline  50 mg Oral QHS  . vitamin B-12  100 mcg Oral Daily    Vital Signs    Vitals:   10/24/17 0800 10/24/17 1752 10/24/17 1923 10/25/17 0324  BP: 140/79 (!) 144/74 129/80 (!) 136/91  Pulse: 62 66 60 (!) 47  Resp: 16 18 18 18   Temp: 98.6 F (37 C) 97.9 F (36.6 C) 98.2 F (36.8 C) 98.3 F (36.8 C)  TempSrc: Oral Oral Oral   SpO2: 95% 96% 95% 97%  Weight:    85.6 kg (188 lb 11.2 oz)  Height:        Intake/Output Summary (Last 24 hours) at 10/25/2017 0735 Last data filed at 10/25/2017 0700 Gross per 24 hour  Intake 308 ml  Output 1400 ml  Net -1092 ml   Filed Weights   10/23/17 1920 10/24/17 0124 10/25/17 0324  Weight: 88.5 kg (195 lb) 87.5 kg (193 lb) 85.6 kg (188 lb 11.2 oz)    Physical Exam    GEN: Well nourished, well developed, in no acute distress.  HEENT: normal.  Neck: Supple, no JVD, carotid bruits, or  masses. Cardiac: RRR, no murmurs, rubs, or gallops. No clubbing, cyanosis, edema.  Radials/DP/PT 2+ and equal bilaterally.  Respiratory:  Respirations regular and unlabored, clear to auscultation bilaterally. GI: Soft, nontender, nondistended, BS + x 4. MS: no deformity or atrophy. Skin: warm and dry, no rash. Neuro:  Strength and sensation are intact. Psych: Normal affect.  Labs    CBC Recent Labs    10/23/17 1937 10/24/17 0153  WBC 10.6 10.0  HGB 14.0 13.7  HCT 42.6 40.4  MCV 76.5* 76.5*  PLT 252 778   Basic Metabolic Panel Recent Labs    10/23/17 1937 10/24/17 0153  NA 141 142  K 3.3* 3.4*  CL 104 103  CO2 27 31  GLUCOSE 107* 108*  BUN 19 17  CREATININE 1.21* 1.09*  CALCIUM 9.2 9.1  MG  --  2.1   Liver Function Tests No results for input(s): AST, ALT, ALKPHOS, BILITOT, PROT, ALBUMIN in the last 72 hours. No results for input(s): LIPASE, AMYLASE in the last 72 hours. Cardiac Enzymes Recent Labs    10/23/17 1937 10/23/17 2242 10/24/17 0153  TROPONINI 0.03* 0.07* 0.08*  BNP No results for input(s): BNP in the last 72 hours. D-Dimer No results for input(s): DDIMER in the last 72 hours. Hemoglobin A1C No results for input(s): HGBA1C in the last 72 hours. Fasting Lipid Panel No results for input(s): CHOL, HDL, LDLCALC, TRIG, CHOLHDL, LDLDIRECT in the last 72 hours. Thyroid Function Tests No results for input(s): TSH, T4TOTAL, T3FREE, THYROIDAB in the last 72 hours.  Invalid input(s): FREET3  Telemetry    Normal sinus rhythm  ECG    Normal sinus rhythm with no ischemia  Radiology    Dg Chest 2 View  Result Date: 10/23/2017 CLINICAL DATA:  Chest pain, atrial fibrillation EXAM: CHEST - 2 VIEW COMPARISON:  CT chest dated 11/16/2016 FINDINGS: Lungs are clear.  No pleural effusion or pneumothorax. The heart is normal in size. Visualized osseous structures are within normal limits. IMPRESSION: Normal chest radiographs. Electronically Signed   By: Julian Hy M.D.   On: 10/23/2017 20:13    Assessment & Plan    Coronary artery heart: Her wounds are normal.  She has ruled out for myocardial infarction.  Functional study as an outpatient showing mild lateral wall ischemia with an EF of 68%.  Cardiac catheterization in 2017 revealed 100% proximal RCA and a 90% first diagonal.  She had a 2.25 x 12 mm Xience Alpine stent placed in the first diagonal.  Plan to proceed with left heart cath to evaluate patency of drug-eluting stent in the first diagonal as well as for progression of coronary disease and the remainder of her left system.  The RCA is chronically occluded.  Atrial fibrillation-paroxysmal-he is on Eliquis for anticoagulation.  This is been held for greater than 48 hours.  Will resume this post cath.  Signed, Javier Docker Dennison Mcdaid MD 10/25/2017, 7:35 AM  Pager: [phone]

## 2017-10-25 NOTE — Consult Note (Signed)
ANTICOAGULATION CONSULT NOTE - Initial Consult  Pharmacy Consult for heparin drip Indication: chest pain/ACS and atrial fibrillation  Allergies  Allergen Reactions  . Codeine Other (See Comments)    Stroke like symptoms    Patient Measurements: Height: 5\' 3"  (160 cm) Weight: 193 lb (87.5 kg) IBW/kg (Calculated) : 52.4 Heparin Dosing Weight: 72.1kg  Vital Signs: Temp: 98.3 F (36.8 C) (08/06 0324) Temp Source: Oral (08/05 1923) BP: 136/91 (08/06 0324) Pulse Rate: 47 (08/06 0324)  Labs: Recent Labs    10/23/17 1937 10/23/17 2242 10/24/17 0153 10/24/17 0951 10/24/17 1902 10/25/17 0409  HGB 14.0  --  13.7  --   --   --   HCT 42.6  --  40.4  --   --   --   PLT 252  --  242  --   --   --   APTT  --   --   --  26 36  --   LABPROT  --   --   --  13.4  --   --   INR  --   --   --  1.03  --   --   HEPARINUNFRC  --   --   --  0.59 0.34 0.34  CREATININE 1.21*  --  1.09*  --   --   --   TROPONINI 0.03* 0.07* 0.08*  --   --   --     Estimated Creatinine Clearance: 48.2 mL/min (A) (by C-G formula based on SCr of 1.09 mg/dL (H)).   Medical History: Past Medical History:  Diagnosis Date  . Anginal pain ([place])   . Arthritis t   knee  . Asthma    mild  . Breast cancer ([place])   . 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 Mamie Nick. at [facility] in [place]  . Depression   . Dyspnea   . Dysrhythmia   . GERD (gastroesophageal reflux disease)   . Hypertension   . Hypothyroidism   . 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  Marland Kitchen No pertinent past medical history   . Sleep apnea t   O2- 2l at bedtime and BiPap @ bedtime    Medications:  Scheduled:  . aspirin  81 mg Oral Daily  . atorvastatin  20 mg Oral q1800  . cholecalciferol  1,000 Units Oral Daily  . diltiazem  300 mg Oral Daily  . docusate sodium  100 mg Oral BID  . [START ON 10/30/2017] ferrous sulfate  325 mg Oral Q Sun-1800  .  fluticasone furoate-vilanterol  1 puff Inhalation Daily  . letrozole  2.5 mg Oral Daily  . levothyroxine  150 mcg Oral QAC breakfast  . Melatonin  5 mg Oral QHS  . metoprolol succinate  50 mg Oral BID  . pantoprazole  40 mg Oral Daily  . [START ON 10/26/2017] potassium chloride  10 mEq Oral Daily  . potassium chloride SA  40 mEq Oral Daily  . sertraline  50 mg Oral QHS  . vitamin B-12  100 mcg Oral Daily    Assessment: Patient is a 72 year old female with a hx of PAF who presents with palpations and chest pain. Patient was found to be in afib by EMS but has since converted to NSR. Pt is on apixaban PTA. Troponins found to be mildly elevated. Pharmacy consulted to bridge from apixaban to heparin drip incase a procedure needs to  be done. Last dose of apixaban was 8/4 AM. Baseline HL was 26 seconds and the f/u level at 1902 is 36 seconds. Baseline heparin level was 0.59 IU/mL and f/u level at 0.34IU.mL. Because there was no baseline aPTT elevation I will dose based on the heparin level only.    Goal of Therapy:  Heparin level 0.3-0.7 units/ml Monitor platelets by anticoagulation protocol: Yes   Plan:  Continue heparin infusion at 850 units/hr Check anti-Xa level in 8 hours and daily while on heparin Continue to monitor H&H and platelets  0806 0400 heparin level 0.34. Continue current regimen. Recheck heparin level and CBC with tomorrow AM labs.  Vallery Sa, Pharm.D Clinical Pharmacist 10/25/2017,4:37 AM

## 2017-10-26 ENCOUNTER — Encounter
Admission: EM | Disposition: A | Discharge status: Home / Self Care | Payer: Self-pay | Source: Home / Self Care | Attending: Internal Medicine

## 2017-10-26 ENCOUNTER — Encounter: Payer: Self-pay | Admitting: *Deleted

## 2017-10-26 HISTORY — PX: LEFT HEART CATH AND CORONARY ANGIOGRAPHY: CATH118249

## 2017-10-26 LAB — GLUCOSE, CAPILLARY: GLUCOSE-CAPILLARY: 110 mg/dL — AB (ref 70–99)

## 2017-10-26 LAB — CBC
HEMATOCRIT: 40 % (ref 35.0–47.0)
HEMOGLOBIN: 13 g/dL (ref 12.0–16.0)
MCH: 25.2 pg — AB (ref 26.0–34.0)
MCHC: 32.5 g/dL (ref 32.0–36.0)
MCV: 77.3 fL — ABNORMAL LOW (ref 80.0–100.0)
Platelets: 230 10*3/uL (ref 150–440)
RBC: 5.18 MIL/uL (ref 3.80–5.20)
RDW: 19.1 % — ABNORMAL HIGH (ref 11.5–14.5)
WBC: 7.8 10*3/uL (ref 3.6–11.0)

## 2017-10-26 SURGERY — LEFT HEART CATH AND CORONARY ANGIOGRAPHY
Anesthesia: Moderate Sedation

## 2017-10-26 MED ORDER — FENTANYL CITRATE (PF) 100 MCG/2ML IJ SOLN
INTRAMUSCULAR | Status: AC
  Filled 2017-10-26: qty 2

## 2017-10-26 MED ORDER — SODIUM CHLORIDE 0.9 % IV SOLN: 250.0000 mL | INTRAVENOUS | Status: DC | PRN

## 2017-10-26 MED ORDER — VERAPAMIL HCL 2.5 MG/ML IV SOLN
INTRAVENOUS | Status: AC
  Filled 2017-10-26: qty 2

## 2017-10-26 MED ORDER — HEPARIN SODIUM (PORCINE) 1000 UNIT/ML IJ SOLN
INTRAMUSCULAR | Status: AC
  Filled 2017-10-26: qty 1

## 2017-10-26 MED ORDER — FENTANYL CITRATE (PF) 100 MCG/2ML IJ SOLN
INTRAMUSCULAR | Status: DC | PRN
  Administered 2017-10-26: 25 ug via INTRAVENOUS

## 2017-10-26 MED ORDER — MIDAZOLAM HCL 2 MG/2ML IJ SOLN
INTRAMUSCULAR | Status: DC | PRN
  Administered 2017-10-26: 1 mg via INTRAVENOUS

## 2017-10-26 MED ORDER — IOPAMIDOL (ISOVUE-300) INJECTION 61%
INTRAVENOUS | Status: DC | PRN
  Administered 2017-10-26: 140 mL via INTRA_ARTERIAL

## 2017-10-26 MED ORDER — HEPARIN (PORCINE) IN NACL 1000-0.9 UT/500ML-% IV SOLN
INTRAVENOUS | Status: AC
  Filled 2017-10-26: qty 1000

## 2017-10-26 MED ORDER — ATORVASTATIN CALCIUM 40 MG PO TABS: 40.0000 mg | ORAL_TABLET | Freq: Every day | ORAL | 0 refills | Status: DC

## 2017-10-26 MED ORDER — ONDANSETRON HCL 4 MG/2ML IJ SOLN: 4.0000 mg | Freq: Four times a day (QID) | INTRAMUSCULAR | Status: DC | PRN

## 2017-10-26 MED ORDER — MIDAZOLAM HCL 2 MG/2ML IJ SOLN
INTRAMUSCULAR | Status: AC
  Filled 2017-10-26: qty 2

## 2017-10-26 MED ORDER — SODIUM CHLORIDE 0.9 % WEIGHT BASED INFUSION: 1.0000 mL/kg/h | INTRAVENOUS | Status: AC

## 2017-10-26 MED ORDER — LIDOCAINE HCL (PF) 1 % IJ SOLN
INTRAMUSCULAR | Status: AC
  Filled 2017-10-26: qty 30

## 2017-10-26 MED ORDER — SODIUM CHLORIDE 0.9% FLUSH: 3.0000 mL | INTRAVENOUS | Status: DC | PRN

## 2017-10-26 MED ORDER — SODIUM CHLORIDE 0.9% FLUSH
3.0000 mL | Freq: Two times a day (BID) | INTRAVENOUS | Status: DC
  Administered 2017-10-27: 3 mL via INTRAVENOUS

## 2017-10-26 MED ORDER — ACETAMINOPHEN 325 MG PO TABS
650.0000 mg | ORAL_TABLET | ORAL | Status: DC | PRN
  Administered 2017-10-26: 650 mg via ORAL
  Filled 2017-10-26: qty 2

## 2017-10-26 SURGICAL SUPPLY — 16 items
CATH INFINITI 5FR ANG PIGTAIL (CATHETERS) ×3 IMPLANT
CATH INFINITI 5FR JL4 (CATHETERS) ×3 IMPLANT
CATH INFINITI 5FR TG (CATHETERS) ×3 IMPLANT
CATH INFINITI JR4 5F (CATHETERS) ×3 IMPLANT
DEVICE CLOSURE MYNXGRIP 5F (Vascular Products) ×3 IMPLANT
DEVICE RAD TR BAND REGULAR (VASCULAR PRODUCTS) ×3 IMPLANT
GUIDEWIRE .025 260CM (WIRE) ×3 IMPLANT
KIT MANI 3VAL PERCEP (MISCELLANEOUS) ×3 IMPLANT
NEEDLE PERC 18GX7CM (NEEDLE) ×3 IMPLANT
NEEDLE PERC 21GX4CM (NEEDLE) ×3 IMPLANT
PACK CARDIAC CATH (CUSTOM PROCEDURE TRAY) ×3 IMPLANT
SHEATH AVANTI 5FR X 11CM (SHEATH) ×3 IMPLANT
SHEATH RAIN RADIAL 21G 6FR (SHEATH) ×3 IMPLANT
WIRE GUIDERIGHT .035X150 (WIRE) ×3 IMPLANT
WIRE HITORQ VERSACORE ST 145CM (WIRE) ×9 IMPLANT
WIRE ROSEN-J .035X260CM (WIRE) ×3 IMPLANT

## 2017-10-26 NOTE — Progress Notes (Signed)
Patient ID: Stacey Stone, female   DOB: 1945-09-15, 72 y.o.   MRN: [id number]  [facility] PROGRESS NOTE  Stacey Stone QBH:[id number] DOB: [birth year] DOA: 10/23/2017 PCP: Rusty Aus, MD  HPI/Subjective: Patient seen this morning and currently in the cardiac Cath Lab recovery.  She felt okay this morning no complaints of chest pain or jaw pain or abdominal pain or groin pain.  She states her pulse is normally in the 50s on her medications.  Objective: Vitals:   10/26/17 1445 10/26/17 1500  BP: (!) 176/83 (!) 169/77  Pulse: (!) 53 (!) 53  Resp: (!) 23 (!) 23  Temp:    SpO2: 97% 95%    Filed Weights   10/25/17 0324 10/26/17 0500 10/26/17 1226  Weight: 85.6 kg (188 lb 11.2 oz) 87.3 kg (192 lb 6.4 oz) 87.1 kg (192 lb)    ROS: Review of Systems  Constitutional: Negative for chills and fever.  Eyes: Negative for blurred vision.  Respiratory: Negative for cough and shortness of breath.   Cardiovascular: Negative for chest pain.  Gastrointestinal: Negative for abdominal pain, constipation, diarrhea, nausea and vomiting.  Genitourinary: Negative for dysuria.  Musculoskeletal: Negative for joint pain.  Neurological: Negative for dizziness and headaches.   Exam: Physical Exam  Constitutional: She is oriented to person, place, and time.  HENT:  Nose: No mucosal edema.  Mouth/Throat: No oropharyngeal exudate or posterior oropharyngeal edema.  Eyes: Pupils are equal, round, and reactive to light. Conjunctivae, EOM and lids are normal.  Neck: No JVD present. Carotid bruit is not present. No edema present. No thyroid mass and no thyromegaly present.  Cardiovascular: S1 normal and S2 normal. Exam reveals no gallop.  No murmur heard. Pulses:      Dorsalis pedis pulses are 2+ on the right side, and 2+ on the left side.  Respiratory: No respiratory distress. She has no wheezes. She has no rhonchi. She has no rales.  GI: Soft. Bowel sounds are normal. There is no  tenderness.  Musculoskeletal:       Right ankle: She exhibits no swelling.       Left ankle: She exhibits no swelling.  Lymphadenopathy:    She has no cervical adenopathy.  Neurological: She is alert and oriented to person, place, and time. No cranial nerve deficit.  Skin: Skin is warm. No rash noted. Nails show no clubbing.  Psychiatric: She has a normal mood and affect.      Data Reviewed: Basic Metabolic Panel: Recent Labs  Lab 10/23/17 1937 10/24/17 0153  NA 141 142  K 3.3* 3.4*  CL 104 103  CO2 27 31  GLUCOSE 107* 108*  BUN 19 17  CREATININE 1.21* 1.09*  CALCIUM 9.2 9.1  MG  --  2.1   CBC: Recent Labs  Lab 10/23/17 1937 10/24/17 0153 10/25/17 1503 10/26/17 0452  WBC 10.6 10.0 8.1 7.8  HGB 14.0 13.7 13.3 13.0  HCT 42.6 40.4 40.6 40.0  MCV 76.5* 76.5* 77.4* 77.3*  PLT 252 242 222 230   Cardiac Enzymes: Recent Labs  Lab 10/23/17 1937 10/23/17 2242 10/24/17 0153  TROPONINI 0.03* 0.07* 0.08*    CBG: Recent Labs  Lab 10/24/17 0837 10/25/17 0810 10/26/17 0722  GLUCAP 103* 112* 110*     Studies: No results found.  Scheduled Meds: . [MAR Hold] aspirin  81 mg Oral Daily  . [MAR Hold] atorvastatin  20 mg Oral q1800  . [MAR Hold] cholecalciferol  1,000 Units Oral Daily  . [facility]  Hold] diltiazem  300 mg Oral Daily  . [MAR Hold] docusate sodium  100 mg Oral BID  . [MAR Hold] ferrous sulfate  325 mg Oral Q Sun-1800  . [MAR Hold] fluticasone furoate-vilanterol  1 puff Inhalation Daily  . [MAR Hold] letrozole  2.5 mg Oral Daily  . [MAR Hold] levothyroxine  150 mcg Oral QAC breakfast  . [MAR Hold] Melatonin  5 mg Oral QHS  . [MAR Hold] metoprolol succinate  50 mg Oral BID  . [MAR Hold] pantoprazole  40 mg Oral Daily  . [MAR Hold] potassium chloride  10 mEq Oral Daily  . [MAR Hold] sertraline  50 mg Oral QHS  . [MAR Hold] sodium chloride flush  3 mL Intravenous Q12H  . [START ON 10/27/2017] sodium chloride flush  3 mL Intravenous Q12H  . [MAR Hold]  vitamin B-12  100 mcg Oral Daily   Continuous Infusions: . [START ON 10/27/2017] sodium chloride    . sodium chloride      Assessment/Plan:  1. chest pain with borderline elevated troponin.  Patient asymptomatic today.  Continue aspirin metoprolol and atorvastatin.  Cardiac catheterization shows a chronically occluded RCA and patent stent.  Medical management recommended. 2. 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  Continue to monitor today.  Cardiology stated she can go back on her Eliquis tomorrow 3. Essential hypertension continue metoprolol and diltiazem 4. Atrial fibrillation on metoprolol and diltiazem.  Can restart Eliquis tomorrow 5. Hyperlipidemia unspecified on atorvastatin 6. Hypothyroidism unspecified on levothyroxine 7. History of breast cancer on letrozole 8. Depression on Zoloft  Code Status:     Code Status Orders  (From admission, onward)        Start     Ordered   10/24/17 0113  Full code  Continuous     10/24/17 0112    Code Status History    Date Active Date Inactive Code Status Order ID Comments User Context   09/24/2015 0905 09/25/2015 1305 Full Code [id number]  Isaias Cowman, MD Inpatient   12/15/2014 1750 12/16/2014 1530 Full Code [id number]  Aldean Jewett, MD Inpatient    Advance Directive Documentation     Most Recent Value  Type of Advance Directive  Healthcare Power of Attorney, Living will  Pre-existing out of facility DNR order (yellow form or pink MOST form)  -  "MOST" Form in Place?  -     Family Communication: Family at bedside Disposition Plan: Potential discharge later today versus tomorrow morning  Consultants:  Cardiology  Time spent: 35 minutes Case discussed with Dr. Ubaldo Glassing cardiology.  [name] [employer]

## 2017-10-26 NOTE — Progress Notes (Signed)
[facility] Cardiology  SUBJECTIVE: Stacey Stone presented to the ED on 10/23/17 with chest pain, palpitations, jaw pain, and bilateral arm pain. Heart catheterization was attempted yesterday with right femoral access.  Complication with access, cath aborted.  Re-attempt at cath will be done today with either left femoral or radial approach.   Today, patient reports she is doing well and is not experiencing any pain. She denies chest pain, shortness of breath, or palpitations. Admits to chronic, bilateral peripheral edema. Also denies right groin pain or back pain.    Vitals:   10/25/17 1243 10/25/17 1947 10/26/17 0500 10/26/17 0719  BP: 139/74 (!) 142/73 137/70 (!) 154/95  Pulse: (!) 58 68 70 (!) 58  Resp: 18 18 18 16   Temp:  98.2 F (36.8 C) 98.4 F (36.9 C) 98.4 F (36.9 C)  TempSrc:  Oral Oral Oral  SpO2: 98% 95% 97% 97%  Weight:   87.3 kg (192 lb 6.4 oz)   Height:         Intake/Output Summary (Last 24 hours) at 10/26/2017 0803 Last data filed at 10/26/2017 0100 Gross per 24 hour  Intake -  Output 1700 ml  Net -1700 ml      PHYSICAL EXAM  General: Well developed, well nourished, in no acute distress HEENT:  Normocephalic and atramatic Neck:  No JVD.  Lungs: Clear bilaterally to auscultation and percussion. Heart: HRRR . Normal S1 and S2 without gallops or murmurs.  Abdomen: Bowel sounds are positive, abdomen soft and non-tender  Msk:  Back normal. Normal strength and tone for age. Gait not assessed.  Extremities: No clubbing or cyanosis. Minor peripheral edema bilaterally. Right groin without drainage or hematoma. Pulses 2+ bilaterally.  Neuro: Alert and oriented X 3. Psych:  Good affect, responds appropriately   LABS: Basic Metabolic Panel: Recent Labs    10/23/17 1937 10/24/17 0153  NA 141 142  K 3.3* 3.4*  CL 104 103  CO2 27 31  GLUCOSE 107* 108*  BUN 19 17  CREATININE 1.21* 1.09*  CALCIUM 9.2 9.1  MG  --  2.1   Liver Function Tests: No results for input(s):  AST, ALT, ALKPHOS, BILITOT, PROT, ALBUMIN in the last 72 hours. No results for input(s): LIPASE, AMYLASE in the last 72 hours. CBC: Recent Labs    10/25/17 1503 10/26/17 0452  WBC 8.1 7.8  HGB 13.3 13.0  HCT 40.6 40.0  MCV 77.4* 77.3*  PLT 222 230   Cardiac Enzymes: Recent Labs    10/23/17 1937 10/23/17 2242 10/24/17 0153  TROPONINI 0.03* 0.07* 0.08*   BNP: Invalid input(s): POCBNP D-Dimer: No results for input(s): DDIMER in the last 72 hours. Hemoglobin A1C: No results for input(s): HGBA1C in the last 72 hours. Fasting Lipid Panel: No results for input(s): CHOL, HDL, LDLCALC, TRIG, CHOLHDL, LDLDIRECT in the last 72 hours. Thyroid Function Tests: No results for input(s): TSH, T4TOTAL, T3FREE, THYROIDAB in the last 72 hours.  Invalid input(s): FREET3 Anemia Panel: No results for input(s): VITAMINB12, FOLATE, FERRITIN, TIBC, IRON, RETICCTPCT in the last 72 hours.  No results found.   Echo: Normal left ventricular function, EF range of 55 to 65% with mild LVH.   ASSESSMENT AND PLAN:  Active Problems:   Chest pain    1. Unstable angina   - Will proceed with cardiac cath today, either left femoral access or radial access   - Continue to hold Eliquis; will resume following cath  2. Atrial fibrillation   - Currently in sinus rhythm, rate  well controlled at 58  - Continue cardizem, metoprolol   - Will resume Eliquis following cath  3. Hypertension   - Continue to monitor  4. Hyperlipidemia   - Continue simvastatin 40mg   5. Acute kidney injury (last creatinine: 1.09)   - Continue to monitor 6. Hypokalemia (3.3, 3.4)   - Continue to monitor   The history, physical exam findings, and plan of care were all discussed with Dr. Bartholome Bill and all decision making was made in collaboration.    Avie Arenas  PA-C 10/26/2017 8:03 AM

## 2017-10-26 NOTE — Discharge Summary (Addendum)
[facility] [place] NAME: Semiyah [facility]    MR#:  [id number]  DATE OF BIRTH:  [birth year]  DATE OF ADMISSION:  10/23/2017 ADMITTING PHYSICIAN: Amelia Jo, MD  DATE OF DISCHARGE: 10/27/72/09  PRIMARY CARE PHYSICIAN: Rusty Aus, MD    ADMISSION DIAGNOSIS:  Elevated troponin [R74.8] Atrial fibrillation, unspecified type ([place]) [I48.91] Chest pain, unspecified type [R07.9]  DISCHARGE DIAGNOSIS:  Active Problems:   Chest pain   SECONDARY DIAGNOSIS:   Past Medical History:  Diagnosis Date  . Anginal pain ([place])   . Arthritis t   knee  . Asthma    mild  . Breast cancer ([place])   . 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 Mamie Nick. at [facility] in [place]  . Depression   . Dyspnea   . Dysrhythmia   . GERD (gastroesophageal reflux disease)   . Hypertension   . Hypothyroidism   . 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  Marland Kitchen No pertinent past medical history   . Sleep apnea t   O2- 2l at bedtime and BiPap @ bedtime    HOSPITAL COURSE:   1.  Chest pain and jaw pain.  Borderline elevated troponin.  Cardiac catheterization showed chronically occluded RCA.  Previous stent is patent.  Proximal LAD 30% stenosis.  Normal left ventricular function.  Patient was switched from simvastatin to atorvastatin.  Continue aspirin and metoprolol. 2.  Dissection of the femoral artery on the right with cardiac catheterization attempt yesterday.  As per Dr. Ubaldo Glassing can go back on Eliquis tomorrow.  Patient asymptomatic and has good pulse in the right lower extremity. 3.  Atrial fibrillation with rapid ventricular response on presentation.  Heart rate currently in the 50s and normally she stays there.  Can go back on metoprolol and diltiazem at current dose.  Can go back on Eliquis tomorrow. 4.  Essential hypertension on metoprolol and diltiazem 5.  Hyperlipidemia  unspecified on atorvastatin 6.  Hypothyroidism unspecified on levothyroxine 7.  History of breast cancer on letrozole 8.  Depression on Zoloft 9. Sinus pause - can consider holter monitor as outpatient   DISCHARGE CONDITIONS:   Satisfactory  CONSULTS OBTAINED:  Treatment Team:  Teodoro Spray, MD  DRUG ALLERGIES:   Allergies  Allergen Reactions  . Codeine Other (See Comments)    Stroke like symptoms    DISCHARGE MEDICATIONS:   Allergies as of 10/27/2017      Reactions   Codeine Other (See Comments)   Stroke like symptoms      Medication List    STOP taking these medications   simvastatin 40 MG tablet Commonly known as:  ZOCOR     TAKE these medications   aspirin 81 MG chewable tablet Chew 81 mg by mouth daily.   atorvastatin 40 MG tablet Commonly known as:  LIPITOR Take 1 tablet (40 mg total) by mouth daily at 6 PM.   BESIVANCE 0.6 % Susp Generic drug:  Besifloxacin HCl Place 1 drop into the left eye See admin instructions. AFTER EYE INJECTION   brimonidine 0.2 % ophthalmic solution Commonly known as:  ALPHAGAN Place 1 drop into the left eye 2 (two) times daily.   diltiazem 300 MG 24 hr capsule Commonly known as:  CARDIZEM CD Take 300 mg by mouth daily.   ELIQUIS 5 MG Tabs tablet Generic drug:  apixaban Take 5 mg by mouth 2 (two) times  daily.   FERROUS SULFATE PO Take 325 mg by mouth once a week.   Fluticasone-Salmeterol 100-50 MCG/DOSE Aepb Commonly known as:  ADVAIR Inhale 1 puff into the lungs 2 (two) times daily as needed (for asthma.).   isosorbide mononitrate 30 MG 24 hr tablet Commonly known as:  IMDUR Take 30 mg by mouth daily.   letrozole 2.5 MG tablet Commonly known as:  FEMARA TAKE 1 TABLET BY MOUTH ONCE DAILY   levothyroxine 150 MCG tablet Commonly known as:  SYNTHROID, LEVOTHROID Take 150 mcg by mouth daily.   MAPAP 500 MG coapsule Generic drug:  Acetaminophen Take by mouth.   Melatonin 5 MG Tabs Take 5 mg by mouth at  bedtime.   metoprolol succinate 100 MG 24 hr tablet Commonly known as:  TOPROL-XL Take 50 mg by mouth 2 (two) times daily.   nitroGLYCERIN 0.4 MG SL tablet Commonly known as:  NITROSTAT Place 0.4 mg under the tongue every 5 (five) minutes as needed for chest pain. Maximum 3 doses, If no relief call md or 911.   oxybutynin 5 MG tablet Commonly known as:  DITROPAN Take 5 mg by mouth daily.   potassium chloride 10 MEQ tablet Commonly known as:  K-DUR Take 1 tablet (10 mEq total) by mouth daily.   RABEprazole 20 MG tablet Commonly known as:  ACIPHEX Take 20 mg by mouth daily.   sertraline 50 MG tablet Commonly known as:  ZOLOFT Take 50 mg by mouth at bedtime.   torsemide 20 MG tablet Commonly known as:  DEMADEX Take 20 mg by mouth daily as needed.   vitamin B-12 100 MCG tablet Commonly known as:  CYANOCOBALAMIN Take 100 mcg by mouth daily.   VITAMIN D-1000 MAX ST 1000 units tablet Generic drug:  Cholecalciferol Take 1,000 Units by mouth daily.        DISCHARGE INSTRUCTIONS:   Follow-up PMD 5 days Follow-up cardiology 1 week  If you experience worsening of your admission symptoms, develop shortness of breath, life threatening emergency, suicidal or homicidal thoughts you must seek medical attention immediately by calling 911 or calling your MD immediately  if symptoms less severe.  You Must read complete instructions/literature along with all the possible adverse reactions/side effects for all the Medicines you take and that have been prescribed to you. Take any new Medicines after you have completely understood and accept all the possible adverse reactions/side effects.   Please note  You were cared for by a hospitalist during your hospital stay. If you have any questions about your discharge medications or the care you received while you were in the hospital after you are discharged, you can call the unit and asked to speak with the hospitalist on call if the  hospitalist that took care of you is not available. Once you are discharged, your primary care physician will handle any further medical issues. Please note that NO REFILLS for any discharge medications will be authorized once you are discharged, as it is imperative that you return to your primary care physician (or establish a relationship with a primary care physician if you do not have one) for your aftercare needs so that they can reassess your need for medications and monitor your lab values.    Today   CHIEF COMPLAINT:   Chief Complaint  Patient presents with  . Chest Pain    HISTORY OF PRESENT ILLNESS:  Malachi Suderman  is a 72 y.o. female presented with chest pain   VITAL SIGNS:  Blood  pressure (!) 165/80, pulse (!) 53, temperature 98.2 F (36.8 C), temperature source Oral, resp. rate 20, height 5\' 3"  (1.6 m), weight 86.7 kg, SpO2 97 %.   PHYSICAL EXAMINATION:  GENERAL:  72 y.o.-year-old patient lying in the bed with no acute distress.  EYES: Pupils equal, round, reactive to light and accommodation. No scleral icterus. Extraocular muscles intact.  HEENT: Head atraumatic, normocephalic. Oropharynx and nasopharynx clear.  NECK:  Supple, no jugular venous distention. No thyroid enlargement, no tenderness.  LUNGS: Normal breath sounds bilaterally, no wheezing, rales,rhonchi or crepitation. No use of accessory muscles of respiration.  CARDIOVASCULAR: S1, S2 normal bradycardia.  No murmurs, rubs, or gallops.  ABDOMEN: Soft, non-tender, non-distended. Bowel sounds present. No organomegaly or mass.  EXTREMITIES: No pedal edema, cyanosis, or clubbing.  NEUROLOGIC: Cranial nerves II through XII are intact. Muscle strength 5/5 in all extremities. Sensation intact. Gait not checked.  PSYCHIATRIC: The patient is alert and oriented x 3.  SKIN: No obvious rash, lesion, or ulcer.   DATA REVIEW:   CBC Recent Labs  Lab 10/26/17 0452  WBC 7.8  HGB 13.0  HCT 40.0  PLT 230     Chemistries  Recent Labs  Lab 10/24/17 0153 10/27/17 0647  NA 142 141  K 3.4* 3.8  CL 103 107  CO2 31 26  GLUCOSE 108* 114*  BUN 17 14  CREATININE 1.09* 0.99  CALCIUM 9.1 9.3  MG 2.1  --     Cardiac Enzymes Recent Labs  Lab 10/24/17 0153  TROPONINI 0.08*    Microbiology Results  Results for orders placed or performed during the hospital encounter of 02/16/11  Surgical pcr screen     Status: None   Collection Time: 02/16/11  9:27 AM  Result Value Ref Range Status   MRSA, PCR NEGATIVE NEGATIVE Final   Staphylococcus aureus NEGATIVE NEGATIVE Final    Comment:        The Xpert SA Assay (FDA approved for NASAL specimens only), is one component of a comprehensive surveillance program.  It is not intended to diagnose infection nor to guide or monitor treatment.      Management plans discussed with the patient, family and they are in agreement.  CODE STATUS:     Code Status Orders  (From admission, onward)        Start     Ordered   10/24/17 0113  Full code  Continuous     10/24/17 0112    Code Status History    Date Active Date Inactive Code Status Order ID Comments User Context   09/24/2015 0905 09/25/2015 1305 Full Code [id number]  Isaias Cowman, MD Inpatient   12/15/2014 1750 12/16/2014 1530 Full Code [id number]  Aldean Jewett, MD Inpatient    Advance Directive Documentation     Most Recent Value  Type of Advance Directive  Healthcare Power of [place], Living will  Pre-existing out of facility DNR order (yellow form or pink MOST form)  -  "MOST" Form in Place?  -      TOTAL TIME TAKING CARE OF THIS PATIENT: 35 minutes.    Loletha Grayer M.D on 10/27/2017 at 9:02 AM  Between 7am to 6pm - Pager - [phone]  After 6pm go to [email or web address] - password [email or web address]  [facility] Office  [phone]  CC: Primary care physician; Rusty Aus, MD

## 2017-10-26 NOTE — Progress Notes (Signed)
Dr. Leslye Peer paged to make aware of pts high BP and low HR/ ordered to give PO cardizem / will continue to monitor.

## 2017-10-26 NOTE — Progress Notes (Addendum)
Dr. Saralyn Pilar paged to update on Pt SBP over 160 post heart cath. Md aware Cardizem 300 mg held this AM for low HR but metoprolol 50mg   PO given, updated on current HR 45-50 BPM.  Per Dr. Saralyn Pilar to give missed dose of Cardizem on arrival to floor. Will update floor RN.

## 2017-10-27 LAB — BASIC METABOLIC PANEL
ANION GAP: 8 (ref 5–15)
BUN: 14 mg/dL (ref 8–23)
CO2: 26 mmol/L (ref 22–32)
Calcium: 9.3 mg/dL (ref 8.9–10.3)
Chloride: 107 mmol/L (ref 98–111)
Creatinine, Ser: 0.99 mg/dL (ref 0.44–1.00)
GFR calc non Af Amer: 55 mL/min — ABNORMAL LOW (ref >60)
Glucose, Bld: 114 mg/dL — ABNORMAL HIGH (ref 70–99)
POTASSIUM: 3.8 mmol/L (ref 3.5–5.1)
SODIUM: 141 mmol/L (ref 135–145)

## 2017-10-27 LAB — GLUCOSE, CAPILLARY: GLUCOSE-CAPILLARY: 111 mg/dL — AB (ref 70–99)

## 2017-10-27 MED ORDER — APIXABAN 5 MG PO TABS
5.0000 mg | ORAL_TABLET | Freq: Two times a day (BID) | ORAL | Status: DC
  Administered 2017-10-27: 5 mg via ORAL
  Filled 2017-10-27: qty 1

## 2017-10-27 MED ORDER — ATORVASTATIN CALCIUM 40 MG PO TABS: 40.0000 mg | ORAL_TABLET | Freq: Every day | ORAL | 0 refills | Status: DC

## 2017-10-27 NOTE — Plan of Care (Signed)
  Problem: Clinical Measurements: Goal: Respiratory complications will improve Outcome: Adequate for Discharge   Problem: Education: Goal: Understanding of CV disease, CV risk reduction, and recovery process will improve Outcome: Adequate for Discharge   Problem: Cardiovascular: Goal: Vascular access site(s) Level 0-1 will be maintained Outcome: Adequate for Discharge

## 2017-10-27 NOTE — Progress Notes (Signed)
Patient ID: Stacey Stone, female   DOB: [birth year], 72 y.o.   MRN: [id number]   [facility] PROGRESS NOTE  DONIESHA LANDAU LNL:[id number] DOB: Sep 28, 1945 DOA: 10/23/2017 PCP: Rusty Aus, MD  HPI/Subjective: Patient feels okay and wants to go home today.  Was concerned about her 3 sites with a cardiac catheterization.  Objective: Vitals:   10/27/17 0417 10/27/17 0804  BP: (!) 156/70 (!) 165/80  Pulse: (!) 49 (!) 53  Resp: 18 20  Temp: (!) 97.5 F (36.4 C) 98.2 F (36.8 C)  SpO2: 95% 97%    Filed Weights   10/26/17 0500 10/26/17 1226 10/27/17 0422  Weight: 87.3 kg 87.1 kg 86.7 kg    ROS: Review of Systems  Constitutional: Negative for chills and fever.  Eyes: Negative for blurred vision.  Respiratory: Negative for cough and shortness of breath.   Cardiovascular: Negative for chest pain.  Gastrointestinal: Negative for abdominal pain, constipation, diarrhea, nausea and vomiting.  Genitourinary: Negative for dysuria.  Musculoskeletal: Negative for joint pain.  Neurological: Negative for dizziness and headaches.   Exam: Physical Exam  Constitutional: She is oriented to person, place, and time.  HENT:  Nose: No mucosal edema.  Mouth/Throat: No oropharyngeal exudate or posterior oropharyngeal edema.  Eyes: Pupils are equal, round, and reactive to light. Conjunctivae, EOM and lids are normal.  Neck: No JVD present. Carotid bruit is not present. No edema present. No thyroid mass and no thyromegaly present.  Cardiovascular: S1 normal and S2 normal. Exam reveals no gallop.  No murmur heard. Pulses:      Dorsalis pedis pulses are 2+ on the right side, and 2+ on the left side.  Respiratory: No respiratory distress. She has no wheezes. She has no rhonchi. She has no rales.  GI: Soft. Bowel sounds are normal. There is no tenderness.  Musculoskeletal:       Right ankle: She exhibits no swelling.       Left ankle: She exhibits no swelling.  Lymphadenopathy:     She has no cervical adenopathy.  Neurological: She is alert and oriented to person, place, and time. No cranial nerve deficit.  Skin: Skin is warm. No rash noted. Nails show no clubbing.  Psychiatric: She has a normal mood and affect.      Data Reviewed: Basic Metabolic Panel: Recent Labs  Lab 10/23/17 1937 10/24/17 0153 10/27/17 0647  NA 141 142 141  K 3.3* 3.4* 3.8  CL 104 103 107  CO2 27 31 26   GLUCOSE 107* 108* 114*  BUN 19 17 14   CREATININE 1.21* 1.09* 0.99  CALCIUM 9.2 9.1 9.3  MG  --  2.1  --    CBC: Recent Labs  Lab 10/23/17 1937 10/24/17 0153 10/25/17 1503 10/26/17 0452  WBC 10.6 10.0 8.1 7.8  HGB 14.0 13.7 13.3 13.0  HCT 42.6 40.4 40.6 40.0  MCV 76.5* 76.5* 77.4* 77.3*  PLT 252 242 222 230   Cardiac Enzymes: Recent Labs  Lab 10/23/17 1937 10/23/17 2242 10/24/17 0153  TROPONINI 0.03* 0.07* 0.08*    CBG: Recent Labs  Lab 10/24/17 0837 10/25/17 0810 10/26/17 0722 10/27/17 0807  GLUCAP 103* 112* 110* 111*     Studies: No results found.  Scheduled Meds: . apixaban  5 mg Oral BID  . aspirin  81 mg Oral Daily  . atorvastatin  20 mg Oral q1800  . cholecalciferol  1,000 Units Oral Daily  . diltiazem  300 mg Oral Daily  . docusate sodium  100  mg Oral BID  . [START ON 10/30/2017] ferrous sulfate  325 mg Oral Q Sun-1800  . fluticasone furoate-vilanterol  1 puff Inhalation Daily  . letrozole  2.5 mg Oral Daily  . levothyroxine  150 mcg Oral QAC breakfast  . Melatonin  5 mg Oral QHS  . metoprolol succinate  50 mg Oral BID  . pantoprazole  40 mg Oral Daily  . potassium chloride  10 mEq Oral Daily  . sertraline  50 mg Oral QHS  . sodium chloride flush  3 mL Intravenous Q12H  . sodium chloride flush  3 mL Intravenous Q12H  . vitamin B-12  100 mcg Oral Daily   Continuous Infusions: . sodium chloride      Assessment/Plan:  1. chest pain with borderline elevated troponin.  Patient asymptomatic today.  Continue aspirin metoprolol and  atorvastatin.  Cardiac catheterization shows a chronically occluded RCA and patent stent.  Medical management recommended. 2. 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  Continue to monitor today.  Cardiology stated she can go back on her Eliquis today 3. Essential hypertension continue metoprolol and diltiazem 4. Atrial fibrillation on metoprolol and diltiazem.  Can restart Eliquis tomorrow 5. Hyperlipidemia unspecified on atorvastatin 6. Hypothyroidism unspecified on levothyroxine 7. History of breast cancer on letrozole 8. Depression on Zoloft 9. Sinus pause.  Can consider Holter monitor as outpatient.  Code Status:     Code Status Orders  (From admission, onward)        Start     Ordered   10/24/17 0113  Full code  Continuous     10/24/17 0112    Code Status History    Date Active Date Inactive Code Status Order ID Comments User Context   09/24/2015 0905 09/25/2015 1305 Full Code [id number]  Isaias Cowman, MD Inpatient   12/15/2014 1750 12/16/2014 1530 Full Code [id number]  Aldean Jewett, MD Inpatient    Advance Directive Documentation     Most Recent Value  Type of Advance Directive  Healthcare Power of Attorney, Living will  Pre-existing out of facility DNR order (yellow form or pink MOST form)  -  "MOST" Form in Place?  -     Family Communication: Family yesterday Disposition Plan: Home today  Consultants:  Cardiology  Time spent: 31 minutes Case discussed with Dr. Ubaldo Glassing cardiology.  Marik Sedore [employer]

## 2017-10-27 NOTE — Discharge Instructions (Signed)
Restart Your Eliquis today Atrial Fibrillation Atrial fibrillation is a type of irregular or rapid heartbeat (arrhythmia). In atrial fibrillation, the heart quivers continuously in a chaotic pattern. This occurs when parts of the heart receive disorganized signals that make the heart unable to pump blood normally. This can increase the risk for stroke, heart failure, and other heart-related conditions. There are different types of atrial fibrillation, including:  Paroxysmal atrial fibrillation. This type starts suddenly, and it usually stops on its own shortly after it starts.  Persistent atrial fibrillation. This type often lasts longer than a week. It may stop on its own or with treatment.  Long-lasting persistent atrial fibrillation. This type lasts longer than 12 months.  Permanent atrial fibrillation. This type does not go away.  Talk with your health care provider to learn about the type of atrial fibrillation that you have. What are the causes? This condition is caused by some heart-related conditions or procedures, including:  A heart attack.  Coronary artery disease.  Heart failure.  Heart valve conditions.  High blood pressure.  Inflammation of the sac that surrounds the heart (pericarditis).  Heart surgery.  Certain heart rhythm disorders, such as Wolf-Parkinson-White syndrome.  Other causes include:  Pneumonia.  Obstructive sleep apnea.  Blockage of an artery in the lungs (pulmonary embolism, or PE).  Lung cancer.  Chronic lung disease.  Thyroid problems, especially if the thyroid is overactive (hyperthyroidism).  Caffeine.  Excessive alcohol use or illegal drug use.  Use of some medicines, including certain decongestants and diet pills.  Sometimes, the cause cannot be found. What increases the risk? This condition is more likely to develop in:  People who are older in age.  People who smoke.  People who have diabetes mellitus.  People who are  overweight (obese).  Athletes who exercise vigorously.  What are the signs or symptoms? Symptoms of this condition include:  A feeling that your heart is beating rapidly or irregularly.  A feeling of discomfort or pain in your chest.  Shortness of breath.  Sudden light-headedness or weakness.  Getting tired easily during exercise.  In some cases, there are no symptoms. How is this diagnosed? Your health care provider may be able to detect atrial fibrillation when taking your pulse. If detected, this condition may be diagnosed with:  An electrocardiogram (ECG).  A Holter monitor test that records your heartbeat patterns over a 24-hour period.  Transthoracic echocardiogram (TTE) to evaluate how blood flows through your heart.  Transesophageal echocardiogram (TEE) to view more detailed images of your heart.  A stress test.  Imaging tests, such as a CT scan or chest X-ray.  Blood tests.  How is this treated? The main goals of treatment are to prevent blood clots from forming and to keep your heart beating at a normal rate and rhythm. The type of treatment that you receive depends on many factors, such as your underlying medical conditions and how you feel when you are experiencing atrial fibrillation. This condition may be treated with:  Medicine to slow down the heart rate, bring the hearts rhythm back to normal, or prevent clots from forming.  Electrical cardioversion. This is a procedure that resets your hearts rhythm by delivering a controlled, low-energy shock to the heart through your skin.  Different types of ablation, such as catheter ablation, catheter ablation with pacemaker, or surgical ablation. These procedures destroy the heart tissues that send abnormal signals. When the pacemaker is used, it is placed under your skin to help  your heart beat in a regular rhythm.  Follow these instructions at home:  Take over-the counter and prescription medicines only as  told by your health care provider.  If your health care provider prescribed a blood-thinning medicine (anticoagulant), take it exactly as told. Taking too much blood-thinning medicine can cause bleeding. If you do not take enough blood-thinning medicine, you will not have the protection that you need against stroke and other problems.  Do not use tobacco products, including cigarettes, chewing tobacco, and e-cigarettes. If you need help quitting, ask your health care provider.  If you have obstructive sleep apnea, manage your condition as told by your health care provider.  Do not drink alcohol.  Do not drink beverages that contain caffeine, such as coffee, soda, and tea.  Maintain a healthy weight. Do not use diet pills unless your health care provider approves. Diet pills may make heart problems worse.  Follow diet instructions as told by your health care provider.  Exercise regularly as told by your health care provider.  Keep all follow-up visits as told by your health care provider. This is important. How is this prevented?  Avoid drinking beverages that contain caffeine or alcohol.  Avoid certain medicines, especially medicines that are used for breathing problems.  Avoid certain herbs and herbal medicines, such as those that contain ephedra or ginseng.  Do not use illegal drugs, such as cocaine and amphetamines.  Do not smoke.  Manage your high blood pressure. Contact a health care provider if:  You notice a change in the rate, rhythm, or strength of your heartbeat.  You are taking an anticoagulant and you notice increased bruising.  You tire more easily when you exercise or exert yourself. Get help right away if:  You have chest pain, abdominal pain, sweating, or weakness.  You feel nauseous.  You notice blood in your vomit, bowel movement, or urine.  You have shortness of breath.  You suddenly have swollen feet and ankles.  You feel dizzy.  You have  sudden weakness or numbness of the face, arm, or leg, especially on one side of the body.  You have trouble speaking, trouble understanding, or both (aphasia).  Your face or your eyelid droops on one side. These symptoms may represent a serious problem that is an emergency. Do not wait to see if the symptoms will go away. Get medical help right away. Call your local emergency services (911 in the [place] Do not drive yourself to the hospital. This information is not intended to replace advice given to you by your health care provider. Make sure you discuss any questions you have with your health care provider. Document Released: 03/08/2005 Document Revised: 07/16/2015 Document Reviewed: 07/03/2014 Elsevier Interactive Patient Education  2018 [place] on my medicine - ELIQUIS (apixaban)   Why was Eliquis prescribed for you? Eliquis was prescribed for you to reduce the risk of a blood clot forming that can cause a stroke if you have a medical condition called atrial fibrillation (a type of irregular heartbeat).  What do You need to know about Eliquis ? Take your Eliquis TWICE DAILY - one tablet in the morning and one tablet in the evening with or without food. If you have difficulty swallowing the tablet whole please discuss with your pharmacist how to take the medication safely.  Take Eliquis exactly as prescribed by your doctor and DO NOT stop taking Eliquis without talking to the doctor who prescribed the medication.  Stopping may increase  your risk of developing a stroke.  Refill your prescription before you run out.  After discharge, you should have regular check-up appointments with your healthcare provider that is prescribing your Eliquis.  In the future your dose may need to be changed if your kidney function or weight changes by a significant amount or as you get older.  What do you do if you miss a dose? If you miss a dose, take it as soon as you remember on  the same day and resume taking twice daily.  Do not take more than one dose of ELIQUIS at the same time to make up a missed dose.  Important Safety Information A possible side effect of Eliquis is bleeding. You should call your healthcare provider right away if you experience any of the following: ? Bleeding from an injury or your nose that does not stop. ? Unusual colored urine (red or dark brown) or unusual colored stools (red or black). ? Unusual bruising for unknown reasons. ? A serious fall or if you hit your head (even if there is no bleeding).  Some medicines may interact with Eliquis and might increase your risk of bleeding or clotting while on Eliquis. To help avoid this, consult your healthcare provider or pharmacist prior to using any new prescription or non-prescription medications, including herbals, vitamins, non-steroidal anti-inflammatory drugs (NSAIDs) and supplements.  This website has more information on Eliquis (apixaban): http://www.eliquis.com/eliquis/home

## 2017-10-27 NOTE — Progress Notes (Signed)
Patient is being discharge home today, discharge instruction provided, IV removed tele removed.,all medication and follow up appointment reviewed with patient

## 2017-11-01 DIAGNOSIS — E782 Mixed hyperlipidemia: Secondary | ICD-10-CM | POA: Diagnosis not present

## 2017-11-01 DIAGNOSIS — I480 Paroxysmal atrial fibrillation: Secondary | ICD-10-CM | POA: Diagnosis not present

## 2017-11-01 DIAGNOSIS — I10 Essential (primary) hypertension: Secondary | ICD-10-CM | POA: Diagnosis not present

## 2017-11-02 ENCOUNTER — Encounter (INDEPENDENT_AMBULATORY_CARE_PROVIDER_SITE_OTHER): Payer: 59 | Admitting: Ophthalmology

## 2017-11-02 DIAGNOSIS — H34812 Central retinal vein occlusion, left eye, with macular edema: Secondary | ICD-10-CM | POA: Diagnosis not present

## 2017-11-02 DIAGNOSIS — I10 Essential (primary) hypertension: Secondary | ICD-10-CM

## 2017-11-02 DIAGNOSIS — H35033 Hypertensive retinopathy, bilateral: Secondary | ICD-10-CM

## 2017-11-02 DIAGNOSIS — H348312 Tributary (branch) retinal vein occlusion, right eye, stable: Secondary | ICD-10-CM

## 2017-11-02 DIAGNOSIS — H43813 Vitreous degeneration, bilateral: Secondary | ICD-10-CM

## 2017-11-03 ENCOUNTER — Ambulatory Visit: Payer: 59

## 2017-11-03 ENCOUNTER — Other Ambulatory Visit: Payer: 59

## 2017-11-03 DIAGNOSIS — I10 Essential (primary) hypertension: Secondary | ICD-10-CM | POA: Diagnosis not present

## 2017-11-03 DIAGNOSIS — I480 Paroxysmal atrial fibrillation: Secondary | ICD-10-CM | POA: Diagnosis not present

## 2017-11-03 DIAGNOSIS — I2510 Atherosclerotic heart disease of native coronary artery without angina pectoris: Secondary | ICD-10-CM | POA: Diagnosis not present

## 2017-11-09 ENCOUNTER — Ambulatory Visit
Admission: RE | Admit: 2017-11-09 | Discharge: 2017-11-09 | Disposition: A | Discharge status: Home / Self Care | Payer: Commercial Managed Care - HMO | Source: Ambulatory Visit | Attending: Oncology | Admitting: Oncology

## 2017-11-09 ENCOUNTER — Encounter: Payer: Self-pay | Admitting: Radiology

## 2017-11-09 DIAGNOSIS — Z13820 Encounter for screening for osteoporosis: Secondary | ICD-10-CM | POA: Insufficient documentation

## 2017-11-09 DIAGNOSIS — Z79811 Long term (current) use of aromatase inhibitors: Secondary | ICD-10-CM | POA: Diagnosis not present

## 2017-11-09 DIAGNOSIS — Z780 Asymptomatic menopausal state: Secondary | ICD-10-CM | POA: Diagnosis not present

## 2017-11-09 DIAGNOSIS — Z853 Personal history of malignant neoplasm of breast: Secondary | ICD-10-CM | POA: Insufficient documentation

## 2017-11-09 DIAGNOSIS — C50412 Malignant neoplasm of upper-outer quadrant of left female breast: Secondary | ICD-10-CM

## 2017-11-13 NOTE — Progress Notes (Signed)
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 MammaPrint low risk.  INTERVAL HISTORY: Patient returns to clinic today for routine six-month evaluation. She currently feels well and is asymptomatic.  She continues to tolerate letrozole well without significant side effects.  She denies any fevers, night sweats, or weight loss. She has no neurologic complaints. She has noted no new lymphadenopathy. She has no chest pain or shortness of breath. She denies any nausea, vomiting, constipation, or diarrhea. She has no urinary complaints.  Patient feels at her baseline offers no specific complaints today.  REVIEW OF SYSTEMS:   Review of Systems  Constitutional: Negative for chills, diaphoresis, fever, malaise/fatigue and weight loss.  Respiratory: Negative.  Negative for cough and shortness of breath.   Cardiovascular: Negative.  Negative for chest pain and leg swelling.  Gastrointestinal: Negative.  Negative for abdominal pain.  Genitourinary: Negative.  Negative for dysuria.  Musculoskeletal: Negative.  Negative for back pain.  Skin: Negative.  Negative for rash.  Neurological: Negative.  Negative for sensory change, focal weakness, weakness and headaches.  Psychiatric/Behavioral: Negative.  The patient is not nervous/anxious.     As per HPI. Otherwise, a complete review of systems is negative.  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 Anginal pain ([place])   . Arthritis t   knee  . Asthma    mild  . Breast  cancer ([place])   . 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 Mamie Nick. at [facility] in [place]  . Depression   . Dyspnea   . Dysrhythmia   . GERD (gastroesophageal reflux disease)   . Hypertension   . Hypothyroidism   . MALT (mucosa associated lymphoid tissue) ([place]) 07/05/2014   Right neck mass resected 01/2014.  Marland Kitchen No pertinent past medical history   . Sleep apnea t   O2- 2l at bedtime and BiPap @ bedtime    PAST SURGICAL HISTORY: Past Surgical History:  Procedure Laterality Date  . BREAST BIOPSY Left 2/136/2018   INVASIVE MAMMARY CARCINOMA  . BREAST BIOPSY Right 05/25/2016   FIBROCYSTIC CHANGE WITH CALCIFICATIONS   . BREAST EXCISIONAL BIOPSY Left 06/04/2016   INVASIVE MAMMARY CARCINOMA.   Marland Kitchen BREAST LUMPECTOMY Left 06/04/2016   INVASIVE MAMMARY CARCINOMA.   Marland Kitchen CARDIAC CATHETERIZATION    . CARDIAC CATHETERIZATION N/A 09/24/2015   Procedure: Left Heart Cath and Coronary Angiography;  Surgeon: Isaias Cowman, MD;  Location: [facility];  Service: Cardiovascular;  Laterality: N/A;  . CARDIAC CATHETERIZATION N/A 09/24/2015   Procedure: Coronary Stent Intervention;  Surgeon: Isaias Cowman, MD;  Location: [facility];  Service: Cardiovascular;  Laterality: N/A;  . CHOLECYSTECTOMY  11/26   08/1970  . DILATATION & CURETTAGE/HYSTEROSCOPY WITH MYOSURE N/A 05/05/2015   Procedure: Hysteroscopy and endometrial curretage;  Surgeon: Boykin Nearing, MD;  Location: [facility];  Service: Gynecology;  Laterality: N/A;  . DILATION AND CURETTAGE OF UTERUS    . EXCISION MASS FROM NECK  Right    Dr.  McQueen  . EYE SURGERY  t   bil cataract 9/08  . HAMMER TOE SURGERY Right   . JOINT REPLACEMENT Bilateral 2008, 11/26/ 2012   Partial Knee Replacements  . LEFT HEART CATH AND CORONARY ANGIOGRAPHY N/A 10/25/2017   Procedure: LEFT HEART CATH AND CORONARY ANGIOGRAPHY;  Surgeon: Teodoro Spray, MD;  Location: [facility];  Service:  Cardiovascular;  Laterality: N/A;  . LEFT HEART CATH AND CORONARY ANGIOGRAPHY N/A 10/26/2017   Procedure: LEFT HEART CATH AND CORONARY ANGIOGRAPHY;  Surgeon: Isaias Cowman, MD;  Location: [facility];  Service: Cardiovascular;  Laterality: N/A;  . PARTIAL MASTECTOMY WITH NEEDLE LOCALIZATION Left 06/04/2016   Procedure: PARTIAL MASTECTOMY WITH NEEDLE LOCALIZATION;  Surgeon: Leonie Green, MD;  Location: [facility];  Service: General;  Laterality: Left;  . SCLERAL BUCKLE  02/16/2011   Procedure: SCLERAL BUCKLE;  Surgeon: Hayden Pedro, MD;  Location: [facility];  Service: Ophthalmology;  Laterality: Left;  Scleral Buckle Left Eye with Headscope Laser  . SENTINEL NODE BIOPSY Left 06/04/2016   Procedure: SENTINEL NODE BIOPSY;  Surgeon: Leonie Green, MD;  Location: [facility];  Service: General;  Laterality: Left;    FAMILY HISTORY Family History  Problem Relation Age of Onset  . Breast cancer Mother 51  . Heart disease Father   . Breast cancer Paternal Aunt 73  . Breast cancer Cousin   . Anesthesia problems Neg Hx   . Hypotension Neg Hx   . Malignant hyperthermia Neg Hx   . Pseudochol deficiency Neg Hx     GYNECOLOGIC HISTORY:  No LMP recorded. Patient is postmenopausal.     ADVANCED DIRECTIVES:    HEALTH MAINTENANCE: Social History   Tobacco Use  . Smoking status: Never Smoker  . Smokeless tobacco: Never Used  Substance Use Topics  . Alcohol use: No  . Drug use: No     Colonoscopy:  PAP:  Bone density:  Mammogram: November 2016  Allergies  Allergen Reactions  . Codeine Other (See Comments)    Stroke like symptoms    Current Outpatient Medications  Medication Sig Dispense Refill  . Acetaminophen (MAPAP) 500 MG coapsule Take by mouth.    Marland Kitchen apixaban (ELIQUIS) 5 MG TABS tablet Take 5 mg by mouth 2 (two) times daily.    Marland Kitchen aspirin 81 MG chewable tablet Chew 81 mg by mouth daily.    Marland Kitchen atorvastatin (LIPITOR) 40 MG tablet Take 1 tablet (40 mg total) by  mouth daily at 6 PM. 30 tablet 0  . Besifloxacin HCl (BESIVANCE) 0.6 % SUSP Place 1 drop into the left eye See admin instructions. AFTER EYE INJECTION    . brimonidine (ALPHAGAN) 0.2 % ophthalmic solution Place 1 drop into the left eye 2 (two) times daily.    . Cholecalciferol (VITAMIN D-1000 MAX ST) 1000 units tablet Take 1,000 Units by mouth daily.    Marland Kitchen diltiazem (CARDIZEM CD) 300 MG 24 hr capsule Take 300 mg by mouth daily.    Marland Kitchen FERROUS SULFATE PO Take 325 mg by mouth once a week.    . Fluticasone-Salmeterol (ADVAIR) 100-50 MCG/DOSE AEPB Inhale 1 puff into the lungs 2 (two) times daily as needed (for asthma.).    Marland Kitchen isosorbide mononitrate (IMDUR) 30 MG 24 hr tablet Take 30 mg by mouth daily.    Marland Kitchen letrozole (FEMARA) 2.5 MG tablet TAKE 1 TABLET BY MOUTH ONCE DAILY 30 tablet 11  . levothyroxine (SYNTHROID, LEVOTHROID) 150 MCG tablet Take 150 mcg by mouth  daily.      . Melatonin 5 MG TABS Take 5 mg by mouth at bedtime.    . metoprolol (TOPROL-XL) 100 MG 24 hr tablet Take 50 mg by mouth 2 (two) times daily.     . nitroGLYCERIN (NITROSTAT) 0.4 MG SL tablet Place 0.4 mg under the tongue every 5 (five) minutes as needed for chest pain. Maximum 3 doses, If no relief call md or 911.    Marland Kitchen oxybutynin (DITROPAN) 5 MG tablet Take 5 mg by mouth daily.    . potassium chloride (K-DUR) 10 MEQ tablet Take 1 tablet (10 mEq total) by mouth daily. 30 tablet 5  . RABEprazole (ACIPHEX) 20 MG tablet Take 20 mg by mouth daily.      . sertraline (ZOLOFT) 50 MG tablet Take 50 mg by mouth at bedtime.      . torsemide (DEMADEX) 20 MG tablet Take 20 mg by mouth daily as needed.     . vitamin B-12 (CYANOCOBALAMIN) 100 MCG tablet Take 100 mcg by mouth daily.     No current facility-administered medications for this visit.     OBJECTIVE: BP 132/83 (BP Location: Right Wrist, Patient Position: Sitting)   Pulse 61   Temp 98.2 F (36.8 C) (Tympanic)   Resp 16   Ht 5' 2.5" (1.588 m)   Wt 193 lb 6.4 oz (87.7 kg)   BMI  34.81 kg/m    Body mass index is 34.81 kg/m.    ECOG FS:0 - Asymptomatic  General: Well-developed, well-nourished, no acute distress. Eyes: Pink conjunctiva, anicteric sclera. HEENT: Normocephalic, moist mucous membranes, clear oropharnyx.  No palpable lymphadenopathy. Breast: Bilateral breast and axilla without lumps or masses. Lungs: Clear to auscultation bilaterally. Heart: Regular rate and rhythm. No rubs, murmurs, or gallops. Abdomen: Soft, nontender, nondistended. No organomegaly noted, normoactive bowel sounds. Musculoskeletal: No edema, cyanosis, or clubbing. Neuro: Alert, answering all questions appropriately. Cranial nerves grossly intact. Skin: No rashes or petechiae noted. Psych: Normal affect.   LAB RESULTS:   STUDIES: No results found.  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 MammaPrint low risk.  PLAN:    1. 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 MammaPrint was reported as low risk, therefore she did not require adjuvant chemotherapy. She also completed adjuvant XRT. Continue letrozole for total 5 years completing in July 2023.  Her most recent mammogram on April 27, 2017 was reported as BI-RADS 3.  Repeat in February 2020.  Return to clinic in 6 months for routine evaluation.  2. 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 Patient's most recent CT scans on November 16, 2016 reviewed independently and reported as above no obvious evidence of recurrent disease.  She also had a maxillofacial CT on April 24, 2017 that revealed no evidence of disease.  Return to clinic in 6 months as above with repeat imaging 1 to 2 days before  appointment.   3. Renal lesion: MRI results reviewed independently indicating this is likely a benign proteinaceous cyst.  Repeat imaging as above. 4.  Bone mineral density: Patient had a bone marrow density completed on November 09, 2017 reported T score of 0.0.  This is considered normal.  Repeat in  August 2021.   Patient expressed understanding and was in agreement with this plan. She also understands that She can call clinic at any time with any questions, concerns, or complaints.     Lloyd Huger, MD   11/21/2017 7:46 AM

## 2017-11-17 ENCOUNTER — Other Ambulatory Visit: Payer: Self-pay

## 2017-11-17 ENCOUNTER — Encounter: Payer: Self-pay | Admitting: Oncology

## 2017-11-17 ENCOUNTER — Inpatient Hospital Stay: Payer: Commercial Managed Care - HMO | Attending: Oncology | Admitting: Oncology

## 2017-11-17 VITALS — BP 132/83 | HR 61 | Temp 98.2°F | Resp 16 | Ht 62.5 in | Wt 193.4 lb

## 2017-11-17 DIAGNOSIS — Z8572 Personal history of non-Hodgkin lymphomas: Secondary | ICD-10-CM | POA: Diagnosis not present

## 2017-11-17 DIAGNOSIS — C884 Extranodal marginal zone B-cell lymphoma of mucosa-associated lymphoid tissue [MALT-lymphoma]: Secondary | ICD-10-CM

## 2017-11-17 DIAGNOSIS — Z923 Personal history of irradiation: Secondary | ICD-10-CM | POA: Insufficient documentation

## 2017-11-17 DIAGNOSIS — C50412 Malignant neoplasm of upper-outer quadrant of left female breast: Secondary | ICD-10-CM | POA: Diagnosis not present

## 2017-11-17 DIAGNOSIS — I10 Essential (primary) hypertension: Secondary | ICD-10-CM | POA: Insufficient documentation

## 2017-11-17 DIAGNOSIS — N289 Disorder of kidney and ureter, unspecified: Secondary | ICD-10-CM | POA: Diagnosis not present

## 2017-11-17 DIAGNOSIS — Z79811 Long term (current) use of aromatase inhibitors: Secondary | ICD-10-CM | POA: Diagnosis not present

## 2017-11-17 NOTE — Progress Notes (Signed)
Patient here for follow up No complaints today. No changes. She had a breast exam in May 2019

## 2017-11-30 ENCOUNTER — Encounter (INDEPENDENT_AMBULATORY_CARE_PROVIDER_SITE_OTHER): Payer: 59 | Admitting: Ophthalmology

## 2017-11-30 DIAGNOSIS — H34812 Central retinal vein occlusion, left eye, with macular edema: Secondary | ICD-10-CM

## 2017-11-30 DIAGNOSIS — I10 Essential (primary) hypertension: Secondary | ICD-10-CM | POA: Diagnosis not present

## 2017-11-30 DIAGNOSIS — H35033 Hypertensive retinopathy, bilateral: Secondary | ICD-10-CM | POA: Diagnosis not present

## 2017-11-30 DIAGNOSIS — H43813 Vitreous degeneration, bilateral: Secondary | ICD-10-CM

## 2017-11-30 DIAGNOSIS — H34831 Tributary (branch) retinal vein occlusion, right eye, with macular edema: Secondary | ICD-10-CM

## 2017-11-30 DIAGNOSIS — H338 Other retinal detachments: Secondary | ICD-10-CM

## 2018-01-04 ENCOUNTER — Encounter (INDEPENDENT_AMBULATORY_CARE_PROVIDER_SITE_OTHER): Payer: 59 | Admitting: Ophthalmology

## 2018-01-04 DIAGNOSIS — H34831 Tributary (branch) retinal vein occlusion, right eye, with macular edema: Secondary | ICD-10-CM | POA: Diagnosis not present

## 2018-01-04 DIAGNOSIS — I10 Essential (primary) hypertension: Secondary | ICD-10-CM | POA: Diagnosis not present

## 2018-01-04 DIAGNOSIS — H338 Other retinal detachments: Secondary | ICD-10-CM

## 2018-01-04 DIAGNOSIS — H35033 Hypertensive retinopathy, bilateral: Secondary | ICD-10-CM

## 2018-01-04 DIAGNOSIS — H34812 Central retinal vein occlusion, left eye, with macular edema: Secondary | ICD-10-CM | POA: Diagnosis not present

## 2018-01-04 DIAGNOSIS — H43813 Vitreous degeneration, bilateral: Secondary | ICD-10-CM

## 2018-01-13 DIAGNOSIS — I480 Paroxysmal atrial fibrillation: Secondary | ICD-10-CM | POA: Diagnosis not present

## 2018-01-16 DIAGNOSIS — H4062X0 Glaucoma secondary to drugs, left eye, stage unspecified: Secondary | ICD-10-CM | POA: Diagnosis not present

## 2018-01-26 DIAGNOSIS — E538 Deficiency of other specified B group vitamins: Secondary | ICD-10-CM | POA: Diagnosis not present

## 2018-01-26 DIAGNOSIS — D500 Iron deficiency anemia secondary to blood loss (chronic): Secondary | ICD-10-CM | POA: Diagnosis not present

## 2018-01-26 DIAGNOSIS — R739 Hyperglycemia, unspecified: Secondary | ICD-10-CM | POA: Diagnosis not present

## 2018-01-26 DIAGNOSIS — E782 Mixed hyperlipidemia: Secondary | ICD-10-CM | POA: Diagnosis not present

## 2018-02-02 DIAGNOSIS — Z0000 Encounter for general adult medical examination without abnormal findings: Secondary | ICD-10-CM | POA: Diagnosis not present

## 2018-02-02 DIAGNOSIS — E782 Mixed hyperlipidemia: Secondary | ICD-10-CM | POA: Diagnosis not present

## 2018-02-02 DIAGNOSIS — Z23 Encounter for immunization: Secondary | ICD-10-CM | POA: Diagnosis not present

## 2018-02-02 DIAGNOSIS — I2510 Atherosclerotic heart disease of native coronary artery without angina pectoris: Secondary | ICD-10-CM | POA: Diagnosis not present

## 2018-02-02 DIAGNOSIS — E538 Deficiency of other specified B group vitamins: Secondary | ICD-10-CM | POA: Diagnosis not present

## 2018-02-02 DIAGNOSIS — I480 Paroxysmal atrial fibrillation: Secondary | ICD-10-CM | POA: Diagnosis not present

## 2018-02-02 DIAGNOSIS — I10 Essential (primary) hypertension: Secondary | ICD-10-CM | POA: Diagnosis not present

## 2018-02-06 ENCOUNTER — Encounter (INDEPENDENT_AMBULATORY_CARE_PROVIDER_SITE_OTHER): Payer: 59 | Admitting: Ophthalmology

## 2018-02-06 DIAGNOSIS — I480 Paroxysmal atrial fibrillation: Secondary | ICD-10-CM | POA: Diagnosis not present

## 2018-02-06 DIAGNOSIS — G4733 Obstructive sleep apnea (adult) (pediatric): Secondary | ICD-10-CM | POA: Diagnosis not present

## 2018-02-06 DIAGNOSIS — Z9989 Dependence on other enabling machines and devices: Secondary | ICD-10-CM | POA: Diagnosis not present

## 2018-02-06 DIAGNOSIS — I10 Essential (primary) hypertension: Secondary | ICD-10-CM | POA: Diagnosis not present

## 2018-02-08 ENCOUNTER — Encounter (INDEPENDENT_AMBULATORY_CARE_PROVIDER_SITE_OTHER): Payer: 59 | Admitting: Ophthalmology

## 2018-02-08 DIAGNOSIS — I2510 Atherosclerotic heart disease of native coronary artery without angina pectoris: Secondary | ICD-10-CM | POA: Diagnosis not present

## 2018-02-08 DIAGNOSIS — Z8679 Personal history of other diseases of the circulatory system: Secondary | ICD-10-CM | POA: Insufficient documentation

## 2018-02-08 DIAGNOSIS — I480 Paroxysmal atrial fibrillation: Secondary | ICD-10-CM | POA: Diagnosis not present

## 2018-02-08 DIAGNOSIS — I10 Essential (primary) hypertension: Secondary | ICD-10-CM | POA: Diagnosis not present

## 2018-02-08 DIAGNOSIS — E782 Mixed hyperlipidemia: Secondary | ICD-10-CM | POA: Diagnosis not present

## 2018-02-09 DIAGNOSIS — I480 Paroxysmal atrial fibrillation: Secondary | ICD-10-CM | POA: Diagnosis not present

## 2018-02-15 ENCOUNTER — Encounter (INDEPENDENT_AMBULATORY_CARE_PROVIDER_SITE_OTHER): Payer: 59 | Admitting: Ophthalmology

## 2018-02-15 DIAGNOSIS — H348312 Tributary (branch) retinal vein occlusion, right eye, stable: Secondary | ICD-10-CM | POA: Diagnosis not present

## 2018-02-15 DIAGNOSIS — H35033 Hypertensive retinopathy, bilateral: Secondary | ICD-10-CM

## 2018-02-15 DIAGNOSIS — H43813 Vitreous degeneration, bilateral: Secondary | ICD-10-CM

## 2018-02-15 DIAGNOSIS — H34812 Central retinal vein occlusion, left eye, with macular edema: Secondary | ICD-10-CM

## 2018-02-15 DIAGNOSIS — I10 Essential (primary) hypertension: Secondary | ICD-10-CM

## 2018-03-07 DIAGNOSIS — E538 Deficiency of other specified B group vitamins: Secondary | ICD-10-CM | POA: Diagnosis not present

## 2018-03-07 DIAGNOSIS — R739 Hyperglycemia, unspecified: Secondary | ICD-10-CM | POA: Diagnosis not present

## 2018-03-07 DIAGNOSIS — E782 Mixed hyperlipidemia: Secondary | ICD-10-CM | POA: Diagnosis not present

## 2018-03-07 DIAGNOSIS — R945 Abnormal results of liver function studies: Secondary | ICD-10-CM | POA: Diagnosis not present

## 2018-03-13 ENCOUNTER — Encounter (INDEPENDENT_AMBULATORY_CARE_PROVIDER_SITE_OTHER): Payer: 59 | Admitting: Ophthalmology

## 2018-03-13 DIAGNOSIS — H43813 Vitreous degeneration, bilateral: Secondary | ICD-10-CM

## 2018-03-13 DIAGNOSIS — H34812 Central retinal vein occlusion, left eye, with macular edema: Secondary | ICD-10-CM | POA: Diagnosis not present

## 2018-03-13 DIAGNOSIS — I10 Essential (primary) hypertension: Secondary | ICD-10-CM | POA: Diagnosis not present

## 2018-03-13 DIAGNOSIS — H348312 Tributary (branch) retinal vein occlusion, right eye, stable: Secondary | ICD-10-CM | POA: Diagnosis not present

## 2018-03-13 DIAGNOSIS — H35033 Hypertensive retinopathy, bilateral: Secondary | ICD-10-CM

## 2018-03-13 DIAGNOSIS — H338 Other retinal detachments: Secondary | ICD-10-CM

## 2018-04-11 ENCOUNTER — Encounter (INDEPENDENT_AMBULATORY_CARE_PROVIDER_SITE_OTHER): Payer: 59 | Admitting: Ophthalmology

## 2018-04-11 DIAGNOSIS — H35033 Hypertensive retinopathy, bilateral: Secondary | ICD-10-CM | POA: Diagnosis not present

## 2018-04-11 DIAGNOSIS — H34812 Central retinal vein occlusion, left eye, with macular edema: Secondary | ICD-10-CM

## 2018-04-11 DIAGNOSIS — I10 Essential (primary) hypertension: Secondary | ICD-10-CM

## 2018-04-11 DIAGNOSIS — H338 Other retinal detachments: Secondary | ICD-10-CM

## 2018-04-11 DIAGNOSIS — H348312 Tributary (branch) retinal vein occlusion, right eye, stable: Secondary | ICD-10-CM | POA: Diagnosis not present

## 2018-04-11 DIAGNOSIS — H43813 Vitreous degeneration, bilateral: Secondary | ICD-10-CM

## 2018-04-20 DIAGNOSIS — J40 Bronchitis, not specified as acute or chronic: Secondary | ICD-10-CM | POA: Diagnosis not present

## 2018-04-20 DIAGNOSIS — E782 Mixed hyperlipidemia: Secondary | ICD-10-CM | POA: Diagnosis not present

## 2018-04-20 DIAGNOSIS — E039 Hypothyroidism, unspecified: Secondary | ICD-10-CM | POA: Diagnosis not present

## 2018-04-28 ENCOUNTER — Ambulatory Visit
Admission: RE | Admit: 2018-04-28 | Discharge: 2018-04-28 | Disposition: A | Discharge status: Home / Self Care | Payer: 59 | Source: Ambulatory Visit | Attending: General Surgery | Admitting: General Surgery

## 2018-04-28 ENCOUNTER — Ambulatory Visit
Admission: RE | Admit: 2018-04-28 | Discharge: 2018-04-28 | Disposition: A | Discharge status: Home / Self Care | Payer: 59 | Source: Ambulatory Visit | Attending: Oncology | Admitting: Oncology

## 2018-04-28 DIAGNOSIS — C884 Extranodal marginal zone B-cell lymphoma of mucosa-associated lymphoid tissue [MALT-lymphoma]: Secondary | ICD-10-CM

## 2018-04-28 DIAGNOSIS — Z853 Personal history of malignant neoplasm of breast: Secondary | ICD-10-CM

## 2018-04-28 DIAGNOSIS — J9811 Atelectasis: Secondary | ICD-10-CM | POA: Diagnosis not present

## 2018-04-28 DIAGNOSIS — R928 Other abnormal and inconclusive findings on diagnostic imaging of breast: Secondary | ICD-10-CM | POA: Diagnosis not present

## 2018-04-28 DIAGNOSIS — N281 Cyst of kidney, acquired: Secondary | ICD-10-CM | POA: Diagnosis not present

## 2018-04-28 HISTORY — DX: Personal history of irradiation: Z92.3

## 2018-04-28 LAB — POCT I-STAT CREATININE: CREATININE: 0.9 mg/dL (ref 0.44–1.00)

## 2018-04-28 MED ORDER — IOPAMIDOL (ISOVUE-300) INJECTION 61%
100.0000 mL | Freq: Once | INTRAVENOUS | Status: AC | PRN
  Administered 2018-04-28: 100 mL via INTRAVENOUS

## 2018-05-05 DIAGNOSIS — Z853 Personal history of malignant neoplasm of breast: Secondary | ICD-10-CM | POA: Diagnosis not present

## 2018-05-09 ENCOUNTER — Encounter (INDEPENDENT_AMBULATORY_CARE_PROVIDER_SITE_OTHER): Payer: 59 | Admitting: Ophthalmology

## 2018-05-09 DIAGNOSIS — H35033 Hypertensive retinopathy, bilateral: Secondary | ICD-10-CM | POA: Diagnosis not present

## 2018-05-09 DIAGNOSIS — I10 Essential (primary) hypertension: Secondary | ICD-10-CM

## 2018-05-09 DIAGNOSIS — H338 Other retinal detachments: Secondary | ICD-10-CM

## 2018-05-09 DIAGNOSIS — H348312 Tributary (branch) retinal vein occlusion, right eye, stable: Secondary | ICD-10-CM | POA: Diagnosis not present

## 2018-05-09 DIAGNOSIS — H34812 Central retinal vein occlusion, left eye, with macular edema: Secondary | ICD-10-CM

## 2018-05-09 DIAGNOSIS — H43813 Vitreous degeneration, bilateral: Secondary | ICD-10-CM

## 2018-05-18 DIAGNOSIS — I10 Essential (primary) hypertension: Secondary | ICD-10-CM | POA: Diagnosis not present

## 2018-05-18 DIAGNOSIS — I480 Paroxysmal atrial fibrillation: Secondary | ICD-10-CM | POA: Diagnosis not present

## 2018-05-18 DIAGNOSIS — I2510 Atherosclerotic heart disease of native coronary artery without angina pectoris: Secondary | ICD-10-CM | POA: Diagnosis not present

## 2018-05-19 ENCOUNTER — Other Ambulatory Visit: Payer: Self-pay

## 2018-05-19 DIAGNOSIS — C50412 Malignant neoplasm of upper-outer quadrant of left female breast: Secondary | ICD-10-CM

## 2018-05-22 ENCOUNTER — Inpatient Hospital Stay: Payer: 59 | Attending: Oncology | Admitting: Oncology

## 2018-05-22 ENCOUNTER — Other Ambulatory Visit: Payer: Self-pay

## 2018-05-22 VITALS — BP 130/79 | HR 63 | Temp 98.0°F | Ht 63.0 in | Wt 180.9 lb

## 2018-05-22 DIAGNOSIS — N289 Disorder of kidney and ureter, unspecified: Secondary | ICD-10-CM

## 2018-05-22 DIAGNOSIS — Z7982 Long term (current) use of aspirin: Secondary | ICD-10-CM | POA: Diagnosis not present

## 2018-05-22 DIAGNOSIS — Z79899 Other long term (current) drug therapy: Secondary | ICD-10-CM | POA: Insufficient documentation

## 2018-05-22 DIAGNOSIS — Z7901 Long term (current) use of anticoagulants: Secondary | ICD-10-CM | POA: Insufficient documentation

## 2018-05-22 DIAGNOSIS — G4730 Sleep apnea, unspecified: Secondary | ICD-10-CM | POA: Diagnosis not present

## 2018-05-22 DIAGNOSIS — Z79811 Long term (current) use of aromatase inhibitors: Secondary | ICD-10-CM | POA: Insufficient documentation

## 2018-05-22 DIAGNOSIS — Z170 Estrogen receptor positive status [ER+]: Secondary | ICD-10-CM | POA: Insufficient documentation

## 2018-05-22 DIAGNOSIS — C884 Extranodal marginal zone B-cell lymphoma of mucosa-associated lymphoid tissue [MALT-lymphoma]: Secondary | ICD-10-CM | POA: Insufficient documentation

## 2018-05-22 DIAGNOSIS — C50412 Malignant neoplasm of upper-outer quadrant of left female breast: Secondary | ICD-10-CM | POA: Diagnosis not present

## 2018-05-22 DIAGNOSIS — Z923 Personal history of irradiation: Secondary | ICD-10-CM

## 2018-05-22 DIAGNOSIS — F329 Major depressive disorder, single episode, unspecified: Secondary | ICD-10-CM | POA: Insufficient documentation

## 2018-05-22 DIAGNOSIS — E039 Hypothyroidism, unspecified: Secondary | ICD-10-CM | POA: Insufficient documentation

## 2018-05-22 DIAGNOSIS — I10 Essential (primary) hypertension: Secondary | ICD-10-CM | POA: Diagnosis not present

## 2018-05-22 DIAGNOSIS — Z8572 Personal history of non-Hodgkin lymphomas: Secondary | ICD-10-CM | POA: Diagnosis not present

## 2018-05-22 NOTE — Progress Notes (Signed)
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 MammaPrint low risk.  INTERVAL HISTORY: Patient returns to clinic today for discussion of her imaging results and routine 39-monthevaluation.  She continues to feel well and remains asymptomatic.  She continues to tolerate letrozole without significant side effects. She denies any fevers, night sweats, or weight loss. She has no neurologic complaints. She has noted no new lymphadenopathy. She has no chest pain or shortness of breath. She denies any nausea, vomiting, constipation, or diarrhea. She has no urinary complaints.  Patient feels at her baseline offers no specific complaints today.  REVIEW OF SYSTEMS:   Review of Systems  Constitutional: Negative for chills, diaphoresis, fever, malaise/fatigue and weight loss.  Respiratory: Negative.  Negative for cough and shortness of breath.   Cardiovascular: Negative.  Negative for chest pain and leg swelling.  Gastrointestinal: Negative.  Negative for abdominal pain.  Genitourinary: Negative.  Negative for dysuria.  Musculoskeletal: Negative.  Negative for back pain.  Skin: Negative.  Negative for rash.  Neurological: Negative.  Negative for sensory change, focal weakness, weakness and headaches.  Psychiatric/Behavioral: Negative.  The patient is not nervous/anxious.     As per HPI. Otherwise, a complete review of systems is negative.  ONCOLOGY HISTORY:   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[place]   07/05/2014 Initial Diagnosis    MALT (mucosa associated lymphoid tissue)     PAST MEDICAL HISTORY: Past Medical History:  Diagnosis Date  . [place] ([place]   . Arthritis t   knee  . Asthma    mild  . Breast cancer ([place]   . 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 [place] . Depression   . Dyspnea   . Dysrhythmia   . GERD (gastroesophageal reflux disease)   . Hypertension   . Hypothyroidism   . MALT (mucosa associated lymphoid tissue) ([place] 07/05/2014   Right neck mass resected 01/2014.  .Marland KitchenNo pertinent past medical history   . Personal history of radiation therapy   . Sleep apnea t   O2- 2l at bedtime and BiPap @ bedtime    PAST SURGICAL HISTORY: Past Surgical History:  Procedure Laterality Date  . BREAST BIOPSY Left 2/136/2018   INVASIVE MAMMARY CARCINOMA  . BREAST BIOPSY Right 05/25/2016   FIBROCYSTIC CHANGE WITH CALCIFICATIONS   . BREAST EXCISIONAL BIOPSY Left 06/04/2016   INVASIVE MAMMARY CARCINOMA.   .Marland KitchenBREAST LUMPECTOMY Left 06/04/2016   INVASIVE MAMMARY CARCINOMA.   .Marland KitchenCARDIAC CATHETERIZATION    . CARDIAC CATHETERIZATION N/A 09/24/2015   Procedure: Left Heart Cath and Coronary Angiography;  Surgeon: AIsaias Cowman MD;  [facility];  Service: Cardiovascular;  Laterality: N/A;  . CARDIAC CATHETERIZATION N/A 09/24/2015   Procedure: Coronary Stent Intervention;  Surgeon: AIsaias Cowman MD;  [facility];  Service: Cardiovascular;  Laterality: N/A;  . CHOLECYSTECTOMY  11/26   08/1970  . DILATATION & CURETTAGE/HYSTEROSCOPY WITH MYOSURE N/A 05/05/2015   Procedure: Hysteroscopy and endometrial curretage;  Surgeon: TBoykin Nearing MD;  Location: [facility];  Service: Gynecology;  Laterality: N/A;  . DILATION AND CURETTAGE OF UTERUS    .  EXCISION MASS FROM NECK  Right    Dr. Tami Ribas  . EYE SURGERY  t   bil cataract 9/08  . HAMMER TOE SURGERY Right   . JOINT REPLACEMENT Bilateral 2008, 11/26/ 2012   Partial Knee Replacements  . LEFT HEART CATH AND CORONARY ANGIOGRAPHY N/A 10/25/2017   Procedure: LEFT HEART CATH AND CORONARY ANGIOGRAPHY;   Surgeon: Teodoro Spray, MD;  Location: [facility] [place] [facility];  Service: Cardiovascular;  Laterality: N/A;  . LEFT HEART CATH AND CORONARY ANGIOGRAPHY N/A 10/26/2017   Procedure: LEFT HEART CATH AND CORONARY ANGIOGRAPHY;  Surgeon: Isaias Cowman, MD;  Location: [facility];  Service: Cardiovascular;  Laterality: N/A;  . PARTIAL MASTECTOMY WITH NEEDLE LOCALIZATION Left 06/04/2016   Procedure: PARTIAL MASTECTOMY WITH NEEDLE LOCALIZATION;  Surgeon: Leonie Green, MD;  Location: [facility];  Service: General;  Laterality: Left;  . SCLERAL BUCKLE  02/16/2011   Procedure: SCLERAL BUCKLE;  Surgeon: Hayden Pedro, MD;  Location: [facility];  Service: Ophthalmology;  Laterality: Left;  Scleral Buckle Left Eye with Headscope Laser  . SENTINEL NODE BIOPSY Left 06/04/2016   Procedure: SENTINEL NODE BIOPSY;  Surgeon: Leonie Green, MD;  Location: [facility];  Service: General;  Laterality: Left;    FAMILY HISTORY Family History  Problem Relation Age of Onset  . Breast cancer Mother 29  . Heart disease Father   . Breast cancer Paternal Aunt 73  . Breast cancer Cousin   . Anesthesia problems Neg Hx   . Hypotension Neg Hx   . Malignant hyperthermia Neg Hx   . Pseudochol deficiency Neg Hx     GYNECOLOGIC HISTORY:  No LMP recorded. Patient is postmenopausal.     ADVANCED DIRECTIVES:    HEALTH MAINTENANCE: Social History   Tobacco Use  . Smoking status: Never Smoker  . Smokeless tobacco: Never Used  Substance Use Topics  . Alcohol use: No  . Drug use: No     Colonoscopy:  PAP:  Bone density:  Mammogram: November 2016  Allergies  Allergen Reactions  . Codeine Other (See Comments)    Stroke like symptoms    Current Outpatient Medications  Medication Sig Dispense Refill  . Acetaminophen (MAPAP) 500 MG coapsule Take by mouth.    Marland Kitchen apixaban (ELIQUIS) 5 MG TABS tablet Take 5 mg by mouth 2 (two) times daily.    Marland Kitchen aspirin 81 MG chewable tablet Chew 81 mg by mouth daily.     Marland Kitchen atorvastatin (LIPITOR) 40 MG tablet Take 1 tablet (40 mg total) by mouth daily at 6 PM. 30 tablet 0  . diltiazem (CARDIZEM CD) 300 MG 24 hr capsule Take 300 mg by mouth daily.    Marland Kitchen FERROUS SULFATE PO Take 325 mg by mouth once a week.    . Fluticasone-Salmeterol (ADVAIR) 100-50 MCG/DOSE AEPB Inhale 1 puff into the lungs 2 (two) times daily as needed (for asthma.).    Marland Kitchen isosorbide mononitrate (IMDUR) 30 MG 24 hr tablet Take 30 mg by mouth daily.    Marland Kitchen letrozole (FEMARA) 2.5 MG tablet TAKE 1 TABLET BY MOUTH ONCE DAILY 30 tablet 11  . levothyroxine (SYNTHROID, LEVOTHROID) 150 MCG tablet Take 150 mcg by mouth daily.      . Melatonin 5 MG TABS Take 5 mg by mouth at bedtime.    . metoprolol (TOPROL-XL) 100 MG 24 hr tablet Take 50 mg by mouth 2 (two) times daily.     . nitroGLYCERIN (NITROSTAT) 0.4 MG SL tablet Place 0.4 mg  under the tongue every 5 (five) minutes as needed for chest pain. Maximum 3 doses, If no relief call md or 911.    Marland Kitchen oxybutynin (DITROPAN) 5 MG tablet Take 5 mg by mouth daily.    . potassium chloride (K-DUR) 10 MEQ tablet Take 1 tablet (10 mEq total) by mouth daily. 30 tablet 5  . RABEprazole (ACIPHEX) 20 MG tablet Take 20 mg by mouth daily.      . sertraline (ZOLOFT) 50 MG tablet Take 50 mg by mouth at bedtime.      . torsemide (DEMADEX) 20 MG tablet Take 20 mg by mouth daily as needed.      No current facility-administered medications for this visit.     OBJECTIVE: BP 130/79 (BP Location: Right Arm, Patient Position: Sitting)   Pulse 63   Temp 98 F (36.7 C) (Tympanic)   Ht 5' 3" (1.6 m)   Wt 180 lb 14.4 oz (82.1 kg)   BMI 32.04 kg/m    Body mass index is 32.04 kg/m.    ECOG FS:0 - Asymptomatic  General: Well-developed, well-nourished, no acute distress. Eyes: Pink conjunctiva, anicteric sclera. HEENT: Normocephalic, moist mucous membranes, clear oropharnyx.  No palpable lymphadenopathy. Breast: Patient declined breast exam today. Lungs: Clear to auscultation  bilaterally. Heart: Regular rate and rhythm. No rubs, murmurs, or gallops. Abdomen: Soft, nontender, nondistended. No organomegaly noted, normoactive bowel sounds. Musculoskeletal: No edema, cyanosis, or clubbing. Neuro: Alert, answering all questions appropriately. Cranial nerves grossly intact. Skin: No rashes or petechiae noted. Psych: Normal affect. Lymphatics: No cervical, calvicular, axillary or inguinal LAD.  LAB RESULTS:   STUDIES: No results found.  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 MammaPrint low risk.  PLAN:    1. 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 MammaPrint was reported as low risk, therefore she did not require adjuvant chemotherapy. She also completed adjuvant XRT. Continue letrozole for total 5 years completing in July 2023.  Patient's most recent mammogram on April 28, 2018 was reported as BI-RADS 2.  Repeat in February 2021.  Return to clinic in 6 months for routine evaluation.   2. 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  Patient's most recent CT scan on April 28, 2018 reviewed independently revealed no evidence of progressive or recurrent disease.  Patient is now nearly 5 years removed from her excision, therefore no further imaging is necessary unless there is suspicion of progression of disease. 3.  Left renal lesion: CT scan results from April 28, 2018 revealed a stable 9 x 8 mm cyst which is likely benign. 4.  Bone mineral density: Patient had a bone marrow density completed on November 09, 2017 reported T score of 0.0.  This is considered normal.  Repeat in August 2021.   Patient expressed understanding and was  in agreement with this plan. She also understands that She can call clinic at any time with any questions, concerns, or complaints.     Lloyd Huger, MD   05/23/2018 5:06 PM

## 2018-05-22 NOTE — Progress Notes (Signed)
Patient is here today to follow up on her Primary cancer of upper outer quadrant of left breast. Patient stated that she had been doing well with no complaints. Patient denied fever, chills, nausea, vomiting, nipple discharge, knots/lumps or skin discoloration. Patient's last mammogram was on 04/28/2018 and it was benign.

## 2018-05-29 DIAGNOSIS — H4062X0 Glaucoma secondary to drugs, left eye, stage unspecified: Secondary | ICD-10-CM | POA: Diagnosis not present

## 2018-05-29 DIAGNOSIS — H3322 Serous retinal detachment, left eye: Secondary | ICD-10-CM | POA: Diagnosis not present

## 2018-06-06 ENCOUNTER — Encounter: Payer: Self-pay | Admitting: Emergency Medicine

## 2018-06-06 ENCOUNTER — Other Ambulatory Visit: Payer: Self-pay

## 2018-06-06 ENCOUNTER — Emergency Department: Payer: 59

## 2018-06-06 ENCOUNTER — Observation Stay
Admission: EM | Admit: 2018-06-06 | Discharge: 2018-06-07 | Disposition: A | Discharge status: Home / Self Care | Payer: 59 | Attending: Internal Medicine | Admitting: Internal Medicine

## 2018-06-06 DIAGNOSIS — E875 Hyperkalemia: Secondary | ICD-10-CM | POA: Insufficient documentation

## 2018-06-06 DIAGNOSIS — Z955 Presence of coronary angioplasty implant and graft: Secondary | ICD-10-CM | POA: Diagnosis not present

## 2018-06-06 DIAGNOSIS — Z7982 Long term (current) use of aspirin: Secondary | ICD-10-CM | POA: Diagnosis not present

## 2018-06-06 DIAGNOSIS — R079 Chest pain, unspecified: Secondary | ICD-10-CM | POA: Diagnosis not present

## 2018-06-06 DIAGNOSIS — Z7901 Long term (current) use of anticoagulants: Secondary | ICD-10-CM | POA: Diagnosis not present

## 2018-06-06 DIAGNOSIS — R7989 Other specified abnormal findings of blood chemistry: Secondary | ICD-10-CM

## 2018-06-06 DIAGNOSIS — Z853 Personal history of malignant neoplasm of breast: Secondary | ICD-10-CM

## 2018-06-06 DIAGNOSIS — E876 Hypokalemia: Secondary | ICD-10-CM | POA: Diagnosis not present

## 2018-06-06 DIAGNOSIS — I480 Paroxysmal atrial fibrillation: Secondary | ICD-10-CM | POA: Insufficient documentation

## 2018-06-06 DIAGNOSIS — Z885 Allergy status to narcotic agent status: Secondary | ICD-10-CM | POA: Insufficient documentation

## 2018-06-06 DIAGNOSIS — R51 Headache: Secondary | ICD-10-CM | POA: Diagnosis not present

## 2018-06-06 DIAGNOSIS — I2510 Atherosclerotic heart disease of native coronary artery without angina pectoris: Secondary | ICD-10-CM | POA: Insufficient documentation

## 2018-06-06 DIAGNOSIS — I10 Essential (primary) hypertension: Secondary | ICD-10-CM | POA: Diagnosis not present

## 2018-06-06 DIAGNOSIS — E039 Hypothyroidism, unspecified: Secondary | ICD-10-CM | POA: Insufficient documentation

## 2018-06-06 DIAGNOSIS — Z8249 Family history of ischemic heart disease and other diseases of the circulatory system: Secondary | ICD-10-CM | POA: Insufficient documentation

## 2018-06-06 DIAGNOSIS — Z96651 Presence of right artificial knee joint: Secondary | ICD-10-CM | POA: Insufficient documentation

## 2018-06-06 DIAGNOSIS — I214 Non-ST elevation (NSTEMI) myocardial infarction: Secondary | ICD-10-CM | POA: Diagnosis not present

## 2018-06-06 DIAGNOSIS — J45909 Unspecified asthma, uncomplicated: Secondary | ICD-10-CM | POA: Diagnosis not present

## 2018-06-06 DIAGNOSIS — I4891 Unspecified atrial fibrillation: Secondary | ICD-10-CM | POA: Diagnosis not present

## 2018-06-06 DIAGNOSIS — K219 Gastro-esophageal reflux disease without esophagitis: Secondary | ICD-10-CM | POA: Diagnosis not present

## 2018-06-06 DIAGNOSIS — F329 Major depressive disorder, single episode, unspecified: Secondary | ICD-10-CM | POA: Diagnosis not present

## 2018-06-06 DIAGNOSIS — R749 Abnormal serum enzyme level, unspecified: Secondary | ICD-10-CM | POA: Diagnosis not present

## 2018-06-06 DIAGNOSIS — Z79890 Hormone replacement therapy (postmenopausal): Secondary | ICD-10-CM | POA: Insufficient documentation

## 2018-06-06 DIAGNOSIS — Z66 Do not resuscitate: Secondary | ICD-10-CM | POA: Diagnosis not present

## 2018-06-06 DIAGNOSIS — Z79899 Other long term (current) drug therapy: Secondary | ICD-10-CM | POA: Insufficient documentation

## 2018-06-06 DIAGNOSIS — G4733 Obstructive sleep apnea (adult) (pediatric): Secondary | ICD-10-CM | POA: Diagnosis not present

## 2018-06-06 DIAGNOSIS — Z923 Personal history of irradiation: Secondary | ICD-10-CM | POA: Diagnosis not present

## 2018-06-06 LAB — CBC
HCT: 41.4 % (ref 36.0–46.0)
Hemoglobin: 13.1 g/dL (ref 12.0–15.0)
MCH: 25.1 pg — ABNORMAL LOW (ref 26.0–34.0)
MCHC: 31.6 g/dL (ref 30.0–36.0)
MCV: 79.5 fL — ABNORMAL LOW (ref 80.0–100.0)
Platelets: 250 10*3/uL (ref 150–400)
RBC: 5.21 MIL/uL — ABNORMAL HIGH (ref 3.87–5.11)
RDW: 16 % — ABNORMAL HIGH (ref 11.5–15.5)
WBC: 7.5 10*3/uL (ref 4.0–10.5)
nRBC: 0 % (ref 0.0–0.2)

## 2018-06-06 LAB — BASIC METABOLIC PANEL
Anion gap: 10 (ref 5–15)
BUN: 20 mg/dL (ref 8–23)
CO2: 24 mmol/L (ref 22–32)
Calcium: 9.5 mg/dL (ref 8.9–10.3)
Chloride: 106 mmol/L (ref 98–111)
Creatinine, Ser: 0.93 mg/dL (ref 0.44–1.00)
GFR calc Af Amer: >60 mL/min (ref >60)
GFR calc non Af Amer: >60 mL/min (ref >60)
GLUCOSE: 95 mg/dL (ref 70–99)
Potassium: 3.4 mmol/L — ABNORMAL LOW (ref 3.5–5.1)
Sodium: 140 mmol/L (ref 135–145)

## 2018-06-06 LAB — TROPONIN I
Troponin I: 0.09 ng/mL (ref <0.03)
Troponin I: 0.11 ng/mL (ref <0.03)
Troponin I: 0.1 ng/mL (ref <0.03)

## 2018-06-06 LAB — MAGNESIUM: Magnesium: 2.4 mg/dL (ref 1.7–2.4)

## 2018-06-06 MED ORDER — APIXABAN 5 MG PO TABS
5.0000 mg | ORAL_TABLET | Freq: Two times a day (BID) | ORAL | Status: DC
  Administered 2018-06-06 – 2018-06-07 (×3): 5 mg via ORAL
  Filled 2018-06-06 (×3): qty 1

## 2018-06-06 MED ORDER — PANTOPRAZOLE SODIUM 40 MG PO TBEC
40.0000 mg | DELAYED_RELEASE_TABLET | Freq: Every day | ORAL | Status: DC
  Administered 2018-06-06 – 2018-06-07 (×2): 40 mg via ORAL
  Filled 2018-06-06 (×2): qty 1

## 2018-06-06 MED ORDER — ISOSORBIDE MONONITRATE ER 30 MG PO TB24
30.0000 mg | ORAL_TABLET | Freq: Every day | ORAL | Status: DC
  Administered 2018-06-06 – 2018-06-07 (×2): 30 mg via ORAL
  Filled 2018-06-06 (×2): qty 1

## 2018-06-06 MED ORDER — LETROZOLE 2.5 MG PO TABS
2.5000 mg | ORAL_TABLET | Freq: Every day | ORAL | Status: DC
  Administered 2018-06-06 – 2018-06-07 (×2): 2.5 mg via ORAL
  Filled 2018-06-06 (×2): qty 1

## 2018-06-06 MED ORDER — SODIUM CHLORIDE 0.9% FLUSH
3.0000 mL | Freq: Two times a day (BID) | INTRAVENOUS | Status: DC
  Administered 2018-06-06 – 2018-06-07 (×3): 3 mL via INTRAVENOUS

## 2018-06-06 MED ORDER — NITROGLYCERIN 0.4 MG SL SUBL: 0.4000 mg | SUBLINGUAL_TABLET | SUBLINGUAL | Status: DC | PRN

## 2018-06-06 MED ORDER — MELATONIN 5 MG PO TABS
5.0000 mg | ORAL_TABLET | Freq: Every day | ORAL | Status: DC
  Administered 2018-06-06: 5 mg via ORAL
  Filled 2018-06-06 (×2): qty 1

## 2018-06-06 MED ORDER — METOPROLOL SUCCINATE ER 50 MG PO TB24
50.0000 mg | ORAL_TABLET | Freq: Two times a day (BID) | ORAL | Status: DC
  Administered 2018-06-07: 50 mg via ORAL
  Filled 2018-06-06 (×2): qty 1

## 2018-06-06 MED ORDER — SERTRALINE HCL 50 MG PO TABS
50.0000 mg | ORAL_TABLET | Freq: Every day | ORAL | Status: DC
  Administered 2018-06-06: 50 mg via ORAL
  Filled 2018-06-06: qty 1

## 2018-06-06 MED ORDER — ONDANSETRON HCL 4 MG PO TABS: 4.0000 mg | ORAL_TABLET | Freq: Four times a day (QID) | ORAL | Status: DC | PRN

## 2018-06-06 MED ORDER — ALBUTEROL SULFATE (2.5 MG/3ML) 0.083% IN NEBU: 2.5000 mg | INHALATION_SOLUTION | RESPIRATORY_TRACT | Status: DC | PRN

## 2018-06-06 MED ORDER — ACETAMINOPHEN 325 MG PO TABS: 650.0000 mg | ORAL_TABLET | Freq: Four times a day (QID) | ORAL | Status: DC | PRN

## 2018-06-06 MED ORDER — LEVOTHYROXINE SODIUM 50 MCG PO TABS
150.0000 ug | ORAL_TABLET | Freq: Every day | ORAL | Status: DC
  Administered 2018-06-06 – 2018-06-07 (×2): 150 ug via ORAL
  Filled 2018-06-06 (×2): qty 1

## 2018-06-06 MED ORDER — ONDANSETRON HCL 4 MG/2ML IJ SOLN: 4.0000 mg | Freq: Four times a day (QID) | INTRAMUSCULAR | Status: DC | PRN

## 2018-06-06 MED ORDER — ACETAMINOPHEN 650 MG RE SUPP: 650.0000 mg | Freq: Four times a day (QID) | RECTAL | Status: DC | PRN

## 2018-06-06 MED ORDER — DILTIAZEM HCL ER COATED BEADS 180 MG PO CP24
300.0000 mg | ORAL_CAPSULE | Freq: Every day | ORAL | Status: DC
  Administered 2018-06-06 – 2018-06-07 (×2): 300 mg via ORAL
  Filled 2018-06-06 (×2): qty 1

## 2018-06-06 MED ORDER — MOMETASONE FURO-FORMOTEROL FUM 100-5 MCG/ACT IN AERO
2.0000 | INHALATION_SPRAY | Freq: Two times a day (BID) | RESPIRATORY_TRACT | Status: DC
  Filled 2018-06-06: qty 8.8

## 2018-06-06 MED ORDER — OXYBUTYNIN CHLORIDE 5 MG PO TABS
5.0000 mg | ORAL_TABLET | Freq: Every day | ORAL | Status: DC
  Administered 2018-06-06: 5 mg via ORAL
  Filled 2018-06-06 (×3): qty 1

## 2018-06-06 MED ORDER — POTASSIUM CHLORIDE CRYS ER 20 MEQ PO TBCR
40.0000 meq | EXTENDED_RELEASE_TABLET | Freq: Once | ORAL | Status: AC
  Administered 2018-06-06: 40 meq via ORAL
  Filled 2018-06-06: qty 2

## 2018-06-06 MED ORDER — POTASSIUM CHLORIDE ER 10 MEQ PO TBCR
10.0000 meq | EXTENDED_RELEASE_TABLET | Freq: Every day | ORAL | Status: DC
  Administered 2018-06-06 – 2018-06-07 (×2): 10 meq via ORAL
  Filled 2018-06-06 (×4): qty 1

## 2018-06-06 MED ORDER — POLYETHYLENE GLYCOL 3350 17 G PO PACK: 17.0000 g | PACK | Freq: Every day | ORAL | Status: DC | PRN

## 2018-06-06 MED ORDER — ASPIRIN 81 MG PO CHEW
81.0000 mg | CHEWABLE_TABLET | Freq: Every day | ORAL | Status: DC
  Administered 2018-06-07: 81 mg via ORAL
  Filled 2018-06-06 (×2): qty 1

## 2018-06-06 MED ORDER — TORSEMIDE 20 MG PO TABS: 10.0000 mg | ORAL_TABLET | Freq: Every morning | ORAL | Status: DC

## 2018-06-06 MED ORDER — ATORVASTATIN CALCIUM 20 MG PO TABS
40.0000 mg | ORAL_TABLET | Freq: Every day | ORAL | Status: DC
  Administered 2018-06-06: 40 mg via ORAL
  Filled 2018-06-06: qty 2

## 2018-06-06 NOTE — ED Notes (Signed)
Report given to [place], Therapist, sports on 2A.

## 2018-06-06 NOTE — ED Provider Notes (Signed)
[facility] Emergency Department Provider Note  ____________________________________________  Time seen: Approximately 9:03 AM  I have reviewed the triage vital signs and the nursing notes.   HISTORY  Chief Complaint Chest Pain   HPI Stacey Stone is a 73 y.o. female with a history of CAD status post stent, A. fib on Eliquis, hypertension, breast cancer, OSA on BiPAP who presents for evaluation of chest pain.  Patient reports that she was cleaning her she was in her closet when she developed pressure in the center of her chest.  Her symptoms were moderate in intensity.  The pressure radiated to her jaw bilaterally and bilateral arms, associated with nausea, diaphoresis.  Patient took 1 sublingual nitro and 50 mg of metoprolol with no significant improvement.  Episode lasted 1 hour and at that point she called 911.  Shortly after EMS arrived the pain subsided.  She received a full dose of aspirin per EMS.  Patient denies any chest pain at this time.  She denies cough, congestion or fever.  Past Medical History:  Diagnosis Date  . Anginal pain ([place])   . Arthritis t   knee  . Asthma    mild  . Breast cancer ([place])   . 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 Mamie Nick. at [facility] in [place]  . Depression   . Dyspnea   . Dysrhythmia   . GERD (gastroesophageal reflux disease)   . Hypertension   . Hypothyroidism   . 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  Marland Kitchen No pertinent past medical history   . Personal history of radiation therapy   . Sleep apnea t   O2- 2l at bedtime and BiPap @ bedtime    Patient Active Problem List   Diagnosis Date Noted  . PMB (postmenopausal bleeding) 06/17/2016  . Preoperative cardiovascular examination 05/13/2016  . Primary cancer of upper outer quadrant of left female breast ([place]) 05/09/2016  . Unstable angina ([place]) 09/24/2015  . S/P  cardiac catheterization 09/24/2015  . History of total right knee replacement 06/25/2015  . B12 deficiency 01/15/2015  . Vitamin D deficiency 01/15/2015  . PAF (paroxysmal atrial fibrillation) ([place]) 12/15/2014  . Chest pain 12/15/2014  . Aortic valve disorder 11/06/2014  . 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 OSA on CPAP 01/02/2014  . Angina at rest [facility]) 06/16/2013  . HTN (hypertension) 06/16/2013  . Hypercholesterolemia 06/16/2013  . Hypothyroidism 06/16/2013  . 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 CAD S/P percutaneous coronary angioplasty 01/08/2006    Past Surgical History:  Procedure Laterality Date  . BREAST BIOPSY Left 2/136/2018   INVASIVE MAMMARY CARCINOMA  . BREAST BIOPSY Right 05/25/2016   FIBROCYSTIC CHANGE WITH CALCIFICATIONS   . BREAST EXCISIONAL BIOPSY Left 06/04/2016   INVASIVE MAMMARY CARCINOMA.   Marland Kitchen BREAST LUMPECTOMY Left 06/04/2016   INVASIVE MAMMARY CARCINOMA.   Marland Kitchen CARDIAC CATHETERIZATION    . CARDIAC CATHETERIZATION N/A 09/24/2015   Procedure: Left Heart Cath and Coronary Angiography;  Surgeon: Isaias Cowman, MD;  Location: [place] [facility];  Service: Cardiovascular;  Laterality: N/A;  . CARDIAC CATHETERIZATION N/A 09/24/2015   Procedure: Coronary Stent Intervention;  Surgeon: Isaias Cowman, MD;  Location: [place] [facility];  Service: Cardiovascular;  Laterality: N/A;  . CHOLECYSTECTOMY  11/26   08/1970  . DILATATION & CURETTAGE/HYSTEROSCOPY WITH MYOSURE N/A 05/05/2015   Procedure: Hysteroscopy and  endometrial curretage;  Surgeon: Boykin Nearing, MD;  Location: [facility] ORS;  Service: Gynecology;  Laterality: N/A;  . DILATION AND CURETTAGE OF UTERUS    . EXCISION MASS FROM NECK  Right    Dr. Tami Ribas  . EYE SURGERY  t   bil cataract 9/08  . HAMMER TOE SURGERY Right   . JOINT REPLACEMENT Bilateral 2008, 11/26/ 2012   Partial Knee Replacements  . LEFT HEART CATH AND CORONARY  ANGIOGRAPHY N/A 10/25/2017   Procedure: LEFT HEART CATH AND CORONARY ANGIOGRAPHY;  Surgeon: Teodoro Spray, MD;  Location: [place] [facility];  Service: Cardiovascular;  Laterality: N/A;  . LEFT HEART CATH AND CORONARY ANGIOGRAPHY N/A 10/26/2017   Procedure: LEFT HEART CATH AND CORONARY ANGIOGRAPHY;  Surgeon: Isaias Cowman, MD;  Location: [place] [facility];  Service: Cardiovascular;  Laterality: N/A;  . PARTIAL MASTECTOMY WITH NEEDLE LOCALIZATION Left 06/04/2016   Procedure: PARTIAL MASTECTOMY WITH NEEDLE LOCALIZATION;  Surgeon: Leonie Green, MD;  Location: [facility];  Service: General;  Laterality: Left;  . SCLERAL BUCKLE  02/16/2011   Procedure: SCLERAL BUCKLE;  Surgeon: Hayden Pedro, MD;  Location: [place];  Service: Ophthalmology;  Laterality: Left;  Scleral Buckle Left Eye with Headscope Laser  . SENTINEL NODE BIOPSY Left 06/04/2016   Procedure: SENTINEL NODE BIOPSY;  Surgeon: Leonie Green, MD;  Location: [facility];  Service: General;  Laterality: Left;    Prior to Admission medications   Medication Sig Start Date End Date Taking? Authorizing Provider  Acetaminophen (MAPAP) 500 MG coapsule Take by mouth.    [provider]  apixaban (ELIQUIS) 5 MG TABS tablet Take 5 mg by mouth 2 (two) times daily.    [provider]  aspirin 81 MG chewable tablet Chew 81 mg by mouth daily.    [provider]  atorvastatin (LIPITOR) 40 MG tablet Take 1 tablet (40 mg total) by mouth daily at 6 PM. 10/27/17   Loletha Grayer, MD  diltiazem (CARDIZEM CD) 300 MG 24 hr capsule Take 300 mg by mouth daily.    [provider]  FERROUS SULFATE PO Take 325 mg by mouth once a week.    [provider]  Fluticasone-Salmeterol (ADVAIR) 100-50 MCG/DOSE AEPB Inhale 1 puff into the lungs 2 (two) times daily as needed (for asthma.).    [provider]  isosorbide mononitrate (IMDUR) 30 MG 24 hr tablet Take 30 mg by mouth daily.    [provider]  letrozole (FEMARA) 2.5 MG tablet TAKE 1 TABLET BY MOUTH ONCE DAILY 09/17/17   Lloyd Huger, MD  levothyroxine (SYNTHROID, LEVOTHROID) 150 MCG tablet Take 150 mcg by mouth daily.      [provider]  Melatonin 5 MG TABS Take 5 mg by mouth at bedtime.    [provider]  metoprolol (TOPROL-XL) 100 MG 24 hr tablet Take 50 mg by mouth 2 (two) times daily.     [provider]  nitroGLYCERIN (NITROSTAT) 0.4 MG SL tablet Place 0.4 mg under the tongue every 5 (five) minutes as needed for chest pain. Maximum 3 doses, If no relief call md or 911.    [provider]  oxybutynin (DITROPAN) 5 MG tablet Take 5 mg by mouth daily.    [provider]  potassium chloride (K-DUR) 10 MEQ tablet Take 1 tablet (10 mEq total) by mouth daily. 09/25/15   Paraschos, Alexander, MD  RABEprazole (ACIPHEX) 20 MG tablet Take 20 mg by mouth daily.  [provider]  sertraline (ZOLOFT) 50 MG tablet Take 50 mg by mouth at bedtime.      [provider]  torsemide (DEMADEX) 20 MG tablet Take 20 mg by mouth daily as needed.     [provider]    Allergies Codeine  Family History  Problem Relation Age of Onset  . Breast cancer Mother 58  . Heart disease Father   . Breast cancer Paternal Aunt 73  . Breast cancer Cousin   . Anesthesia problems Neg Hx   . Hypotension Neg Hx   . Malignant hyperthermia Neg Hx   . Pseudochol deficiency Neg Hx     Social History Social History   Tobacco Use  . Smoking status: Never Smoker  . Smokeless tobacco: Never Used  Substance Use Topics  . Alcohol use: No  . Drug use: No    Review of Systems  Constitutional: Negative for fever. Eyes: Negative for visual changes. ENT: Negative for sore throat. Neck: No neck pain  Cardiovascular: + chest pain, diaphoresis Respiratory: Negative for shortness of breath. Gastrointestinal: Negative for abdominal pain, vomiting or diarrhea. + nausea  Genitourinary: Negative for dysuria. Musculoskeletal: Negative for back pain. Skin: Negative for rash. Neurological: Negative for headaches, weakness or numbness. Psych: No SI or HI  ____________________________________________   PHYSICAL EXAM:  VITAL SIGNS: ED Triage Vitals  Enc Vitals Group     BP 06/06/18 0815 (!) 142/85     Pulse Rate 06/06/18 0817 69     Resp 06/06/18 0815 17     Temp 06/06/18 0817 98.1 F (36.7 C)     Temp Source 06/06/18 0817 Oral     SpO2 06/06/18 0817 98 %     Weight 06/06/18 0818 177 lb (80.3 kg)     Height 06/06/18 0818 5\' 3"  (1.6 m)     Head Circumference --      Peak Flow --      Pain Score 06/06/18 0818 0     Pain Loc --      Pain Edu? --      Excl. in [place]? --     Constitutional: Alert and oriented. Well appearing and in no apparent distress. HEENT:      Head: Normocephalic and atraumatic.         Eyes: Conjunctivae are normal. Sclera is non-icteric.       Mouth/Throat: Mucous membranes are moist.       Neck: Supple with no signs of meningismus. Cardiovascular: Regular rate and rhythm. No murmurs, gallops, or rubs. 2+ symmetrical distal pulses are present in all extremities. No JVD. Respiratory: Normal respiratory effort. Lungs are clear to auscultation bilaterally. No wheezes, crackles, or rhonchi.  Gastrointestinal: Soft, non tender, and non distended with positive bowel sounds. No rebound or guarding. Musculoskeletal: Nontender with normal range of motion in all extremities. No edema, cyanosis, or erythema of extremities. Neurologic: Normal speech and language. Face is symmetric. Moving all extremities. No gross focal neurologic deficits are appreciated. Skin: Skin is warm, dry and intact. No rash noted. Psychiatric: Mood and affect are normal. Speech and behavior are normal.  ____________________________________________   LABS (all labs ordered are listed, but only abnormal results are displayed)  Labs Reviewed  BASIC METABOLIC  PANEL - Abnormal; Notable for the following components:      Result Value   Potassium 3.4 (*)    All other components within normal limits  CBC - Abnormal; Notable for the following components:   RBC 5.21 (*)  MCV 79.5 (*)    MCH 25.1 (*)    RDW 16.0 (*)    All other components within normal limits  TROPONIN I - Abnormal; Notable for the following components:   Troponin I 0.09 (*)    All other components within normal limits  TROPONIN I   ____________________________________________  EKG  ED ECG REPORT I, Rudene Re, the attending physician, personally viewed and interpreted this ECG.  Normal sinus rhythm, rate of 69, first-degree AV block, normal QTC, normal axis, no ST elevations or depressions. ____________________________________________  RADIOLOGY  I have personally reviewed the images performed during this visit and I agree with the Radiologist's read.   Interpretation by Radiologist:  Dg Chest 2 View  Result Date: 06/06/2018 CLINICAL DATA:  Chest pain.  Hypertension. EXAM: CHEST - 2 VIEW COMPARISON:  Chest radiograph October 23, 2017 and chest CT April 28, 2018 FINDINGS: There is atelectatic change in the left base. There is mild scarring in the right upper lobe. There is no edema or consolidation. Heart is upper normal in size with pulmonary vascularity normal. No adenopathy. No bone lesions. IMPRESSION: Left base atelectasis. Mild scarring right upper lobe. No edema or consolidation. Stable cardiac silhouette. Electronically Signed   By: Lowella Grip III M.D.   On: 06/06/2018 08:51     ____________________________________________   PROCEDURES  Procedure(s) performed: None Procedures Critical Care performed: yes  CRITICAL CARE Performed by: Rudene Re  ?  Total critical care time: 35 min  Critical care time was exclusive of separately billable procedures and treating other patients.  Critical care was necessary to treat or prevent  imminent or life-threatening deterioration.  Critical care was time spent personally by me on the following activities: development of treatment plan with patient and/or surrogate as well as nursing, discussions with consultants, evaluation of patient's response to treatment, examination of patient, obtaining history from patient or surrogate, ordering and performing treatments and interventions, ordering and review of laboratory studies, ordering and review of radiographic studies, pulse oximetry and re-evaluation of patient's condition.  ____________________________________________   INITIAL IMPRESSION / ASSESSMENT AND PLAN / ED COURSE  73 y.o. female with a history of CAD status post stent, A. fib on Eliquis, hypertension, breast cancer, OSA on BiPAP who presents for evaluation of chest pain.  Patient presents with high risk chest pain and symptoms consistent with acute coronary syndrome.  Upon arrival EKG showing no acute ischemic changes.  First troponin is negative.  Recommended admission to the hospital however patient prefers to go home in the setting current COVID epidemic.  I discussed risks of going home in the setting of acute coronary syndrome.  Patient is aware.  I was able to convince patient to get a second troponin in 3 hours and if that is negative and she remains pain-free we will discharge her at that time with close follow-up with her cardiologist.  Patient agrees with admission if the second troponin is positive.  We will continue to monitor closely.    _________________________ 12:41 PM on 06/06/2018 -----------------------------------------  Second troponin elevated at 0.09.  Patient remains pain-free.  Will admit to the hospitalist for further evaluation.   As part of my medical decision making, I reviewed the following data within the [place] notes reviewed and incorporated, Labs reviewed , EKG interpreted , Old chart reviewed, Radiograph  reviewed , Discussed with admitting physician , Notes from prior ED visits and [place] Controlled Substance Database    Pertinent labs &  imaging results that were available during my care of the patient were reviewed by me and considered in my medical decision making (see chart for details).    ____________________________________________   FINAL CLINICAL IMPRESSION(S) / ED DIAGNOSES  Final diagnoses:  NSTEMI (non-ST elevated myocardial infarction) ([place])      NEW MEDICATIONS STARTED DURING THIS VISIT:  ED Discharge Orders    None       Note:  This document was prepared using Dragon voice recognition software and may include unintentional dictation errors.    Alfred Levins, [place], MD 06/06/18 [phone]

## 2018-06-06 NOTE — H&P (Signed)
[facility] [place] NAME: Stacey Stone    MR#:  [id number]  DATE OF BIRTH:  08-Nov-1945  DATE OF ADMISSION:  06/06/2018  PRIMARY CARE PHYSICIAN: Rusty Aus, MD   REQUESTING/REFERRING PHYSICIAN: Dr. Alfred Levins  CHIEF COMPLAINT:   Chief Complaint  Patient presents with  . Chest Pain    HISTORY OF PRESENT ILLNESS:  Stacey Stone  is a 73 y.o. female with a known history of hypertension, CAD, atrial fibrillation, breast cancer on Eliquis with chronically occluded RCA on cardiac catheterization in August 2019 and presents to the emergency room with acute onset of chest pain radiating to left arm and bilateral jaw at 6:30 AM.  Patient took nitro at 7 AM with no improvement.  After 15 minutes she took her metoprolol and her chest pain resolved in a few minutes.  She checked her heart rate at home which was 150s.  Was also 150s with EMS.  Her chest pain improved once heart rate returned to normal.  Here in the emergency room EKG shows no acute changes.  Troponin initially was 0.03 and then increased to 0.09 prompting request for admission.  Patient had ablation in November 2019 and has not had any problems with atrial fibrillation since.  She also saw her cardiologist Dr. Laverle Patter shows 2 weeks back. Presently she is chest pain-free in normal sinus rhythm.  PAST MEDICAL HISTORY:   Past Medical History:  Diagnosis Date  . Anginal pain ([place])   . Arthritis t   knee  . Asthma    mild  . Breast cancer ([place])   . 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 Mamie Nick. at [place] [facility] in [place]  . Depression   . Dyspnea   . Dysrhythmia   . GERD (gastroesophageal reflux disease)   . Hypertension   . Hypothyroidism   . 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  Marland Kitchen No pertinent past medical history   . Personal history of radiation therapy   . Sleep apnea t   O2- 2l  at bedtime and BiPap @ bedtime    PAST SURGICAL HISTORY:   Past Surgical History:  Procedure Laterality Date  . BREAST BIOPSY Left 2/136/2018   INVASIVE MAMMARY CARCINOMA  . BREAST BIOPSY Right 05/25/2016   FIBROCYSTIC CHANGE WITH CALCIFICATIONS   . BREAST EXCISIONAL BIOPSY Left 06/04/2016   INVASIVE MAMMARY CARCINOMA.   Marland Kitchen BREAST LUMPECTOMY Left 06/04/2016   INVASIVE MAMMARY CARCINOMA.   Marland Kitchen CARDIAC CATHETERIZATION    . CARDIAC CATHETERIZATION N/A 09/24/2015   Procedure: Left Heart Cath and Coronary Angiography;  Surgeon: Isaias Cowman, MD;  Location: [facility];  Service: Cardiovascular;  Laterality: N/A;  . CARDIAC CATHETERIZATION N/A 09/24/2015   Procedure: Coronary Stent Intervention;  Surgeon: Isaias Cowman, MD;  Location: [facility];  Service: Cardiovascular;  Laterality: N/A;  . CHOLECYSTECTOMY  11/26   08/1970  . DILATATION & CURETTAGE/HYSTEROSCOPY WITH MYOSURE N/A 05/05/2015   Procedure: Hysteroscopy and endometrial curretage;  Surgeon: Boykin Nearing, MD;  Location: [facility];  Service: Gynecology;  Laterality: N/A;  . DILATION AND CURETTAGE OF UTERUS    . EXCISION MASS FROM NECK  Right    Dr. Tami Ribas  . EYE SURGERY  t   bil cataract 9/08  . HAMMER TOE SURGERY Right   . JOINT REPLACEMENT Bilateral 2008, 11/26/ 2012   Partial Knee Replacements  . LEFT  HEART CATH AND CORONARY ANGIOGRAPHY N/A 10/25/2017   Procedure: LEFT HEART CATH AND CORONARY ANGIOGRAPHY;  Surgeon: Teodoro Spray, MD;  Location: [facility];  Service: Cardiovascular;  Laterality: N/A;  . LEFT HEART CATH AND CORONARY ANGIOGRAPHY N/A 10/26/2017   Procedure: LEFT HEART CATH AND CORONARY ANGIOGRAPHY;  Surgeon: Isaias Cowman, MD;  Location: [facility];  Service: Cardiovascular;  Laterality: N/A;  . PARTIAL MASTECTOMY WITH NEEDLE LOCALIZATION Left 06/04/2016   Procedure: PARTIAL MASTECTOMY WITH NEEDLE LOCALIZATION;  Surgeon: Leonie Green, MD;   Location: [facility];  Service: General;  Laterality: Left;  . SCLERAL BUCKLE  02/16/2011   Procedure: SCLERAL BUCKLE;  Surgeon: Hayden Pedro, MD;  Location: [facility];  Service: Ophthalmology;  Laterality: Left;  Scleral Buckle Left Eye with Headscope Laser  . SENTINEL NODE BIOPSY Left 06/04/2016   Procedure: SENTINEL NODE BIOPSY;  Surgeon: Leonie Green, MD;  Location: [facility];  Service: General;  Laterality: Left;    SOCIAL HISTORY:   Social History   Tobacco Use  . Smoking status: Never Smoker  . Smokeless tobacco: Never Used  Substance Use Topics  . Alcohol use: No    FAMILY HISTORY:   Family History  Problem Relation Age of Onset  . Breast cancer Mother 14  . Heart disease Father   . Breast cancer Paternal Aunt 73  . Breast cancer Cousin   . Anesthesia problems Neg Hx   . Hypotension Neg Hx   . Malignant hyperthermia Neg Hx   . Pseudochol deficiency Neg Hx     DRUG ALLERGIES:   Allergies  Allergen Reactions  . Codeine Other (See Comments)    Stroke like symptoms    REVIEW OF SYSTEMS:   Review of Systems  Constitutional: Positive for malaise/fatigue. Negative for chills and fever.  HENT: Negative for sore throat.   Eyes: Negative for blurred vision, double vision and pain.  Respiratory: Negative for cough, hemoptysis, shortness of breath and wheezing.   Cardiovascular: Positive for chest pain. Negative for palpitations, orthopnea and leg swelling.  Gastrointestinal: Negative for abdominal pain, constipation, diarrhea, heartburn, nausea and vomiting.  Genitourinary: Negative for dysuria and hematuria.  Musculoskeletal: Negative for back pain and joint pain.  Skin: Negative for rash.  Neurological: Negative for sensory change, speech change, focal weakness and headaches.  Endo/Heme/Allergies: Does not bruise/bleed easily.  Psychiatric/Behavioral: Negative for depression. The patient is not nervous/anxious.     MEDICATIONS AT HOME:   Prior to Admission  medications   Medication Sig Start Date End Date Taking? Authorizing Provider  apixaban (ELIQUIS) 5 MG TABS tablet Take 5 mg by mouth 2 (two) times daily.   Yes [provider]  aspirin 81 MG chewable tablet Chew 81 mg by mouth daily.   Yes [provider]  atorvastatin (LIPITOR) 40 MG tablet Take 1 tablet (40 mg total) by mouth daily at 6 PM. 10/27/17  Yes Wieting, Richard, MD  diltiazem (CARDIZEM CD) 300 MG 24 hr capsule Take 300 mg by mouth daily.   Yes [provider]  FERROUS SULFATE PO Take 325 mg by mouth once a week.   Yes [provider]  isosorbide mononitrate (IMDUR) 30 MG 24 hr tablet Take 30 mg by mouth daily.   Yes [provider]  letrozole (FEMARA) 2.5 MG tablet TAKE 1 TABLET BY MOUTH ONCE DAILY 09/17/17  Yes Lloyd Huger, MD  levothyroxine (SYNTHROID, LEVOTHROID) 150 MCG tablet Take 150 mcg by mouth daily.  Yes [provider]  Melatonin 5 MG TABS Take 5 mg by mouth at bedtime.   Yes [provider]  metoprolol (TOPROL-XL) 100 MG 24 hr tablet Take 50 mg by mouth 2 (two) times daily.    Yes [provider]  oxybutynin (DITROPAN) 5 MG tablet Take 5 mg by mouth daily.   Yes [provider]  potassium chloride (K-DUR) 10 MEQ tablet Take 1 tablet (10 mEq total) by mouth daily. 09/25/15  Yes Paraschos, Alexander, MD  RABEprazole (ACIPHEX) 20 MG tablet Take 20 mg by mouth daily.     Yes [provider]  sertraline (ZOLOFT) 50 MG tablet Take 50 mg by mouth at bedtime.     Yes [provider]  torsemide (DEMADEX) 20 MG tablet Take 10 mg by mouth every morning.    Yes [provider]  Acetaminophen (MAPAP) 500 MG coapsule Take by mouth.    [provider]  Fluticasone-Salmeterol (ADVAIR) 100-50 MCG/DOSE AEPB Inhale 1 puff into the lungs 2 (two) times daily as needed (for asthma.).    [provider]  nitroGLYCERIN (NITROSTAT) 0.4 MG SL tablet Place 0.4 mg under  the tongue every 5 (five) minutes as needed for chest pain. Maximum 3 doses, If no relief call md or 911.    [provider]  sucralfate (CARAFATE) 1 g tablet Take 1 g by mouth 4 (four) times daily. 02/09/18   [provider]     VITAL SIGNS:  Blood pressure (!) 130/111, pulse (!) 55, temperature 98.1 F (36.7 C), temperature source Oral, resp. rate 17, height 5\' 3"  (1.6 m), weight 80.3 kg, SpO2 99 %.  PHYSICAL EXAMINATION:  Physical Exam  GENERAL:  73 y.o.-year-old patient lying in the bed with no acute distress.  Obese EYES: Pupils equal, round, reactive to light and accommodation. No scleral icterus. Extraocular muscles intact.  HEENT: Head atraumatic, normocephalic. Oropharynx and nasopharynx clear. No oropharyngeal erythema, moist oral mucosa  NECK:  Supple, no jugular venous distention. No thyroid enlargement, no tenderness.  LUNGS: Normal breath sounds bilaterally, no wheezing, rales, rhonchi. No use of accessory muscles of respiration.  CARDIOVASCULAR: S1, S2 normal. No murmurs, rubs, or gallops.  ABDOMEN: Soft, nontender, nondistended. Bowel sounds present. No organomegaly or mass.  EXTREMITIES: No pedal edema, cyanosis, or clubbing. + 2 pedal & radial pulses b/l.   NEUROLOGIC: Cranial nerves II through XII are intact. No focal Motor or sensory deficits appreciated b/l PSYCHIATRIC: The patient is alert and oriented x 3. Good affect.  SKIN: No obvious rash, lesion, or ulcer.   LABORATORY PANEL:   CBC Recent Labs  Lab 06/06/18 0825  WBC 7.5  HGB 13.1  HCT 41.4  PLT 250   ------------------------------------------------------------------------------------------------------------------  Chemistries  Recent Labs  Lab 06/06/18 0825  NA 140  K 3.4*  CL 106  CO2 24  GLUCOSE 95  BUN 20  CREATININE 0.93  CALCIUM 9.5   ------------------------------------------------------------------------------------------------------------------  Cardiac  Enzymes Recent Labs  Lab 06/06/18 1133  TROPONINI 0.09*   ------------------------------------------------------------------------------------------------------------------  RADIOLOGY:  Dg Chest 2 View  Result Date: 06/06/2018 CLINICAL DATA:  Chest pain.  Hypertension. EXAM: CHEST - 2 VIEW COMPARISON:  Chest radiograph October 23, 2017 and chest CT April 28, 2018 FINDINGS: There is atelectatic change in the left base. There is mild scarring in the right upper lobe. There is no edema or consolidation. Heart is upper normal in size with pulmonary vascularity normal. No adenopathy. No bone lesions. IMPRESSION: Left base atelectasis. Mild  scarring right upper lobe. No edema or consolidation. Stable cardiac silhouette. Electronically Signed   By: Lowella Grip III M.D.   On: 06/06/2018 08:51     IMPRESSION AND PLAN:   *Chest pain rating to left arm and jaw in patient with CAD and chronically occluded RCA.  This was associated with significant tachycardia in the 150s.  I suspect patient had chest pain likely due to atrial fibrillation with rapid ventricular rate.  At this time her troponin is elevated but not significantly.  Will admit under observation.  Continue patient's Cardizem and metoprolol.  Repeat troponin.  Continue telemetry monitoring.  Consult cardiology.  Message sent to Dr. Ubaldo Glassing.  Nitro as needed.  *Hypertension.  Continue home medications.  *Paroxysmal atrial fibrillation.  On Cardizem and metoprolol.  Continue Eliquis   All the records are reviewed and case discussed with ED provider. Management plans discussed with the patient, family and they are in agreement.  CODE STATUS: DNR/DNI  TOTAL TIME TAKING CARE OF THIS PATIENT: 35 minutes.   Leia Alf Neythan Kozlov M.D on 06/06/2018 at 1:24 PM  Between 7am to 6pm - Pager - [phone]  After 6pm go to [email or web address] - password [email or web address] [facility]  Office  [phone]  CC: Primary care physician; Rusty Aus, MD  Note: This dictation was prepared with Dragon dictation along with smaller phrase technology. Any transcriptional errors that result from this process are unintentional.

## 2018-06-06 NOTE — Consult Note (Signed)
[facility]  CARDIOLOGY CONSULT NOTE  Patient ID: Stacey Stone MRN: [id number] DOB/AGE: [birth year] 75 y.o.  Admit date: 06/06/2018 Referring Physician Dr. Hillary Bow  Primary Physician Dr. Emily Filbert Primary Cardiologist Dr. Saralyn Pilar  Reason for Consultation Chest pain/elevated troponin   HPI: Stacey Stone is a 73 year old female with a past medical history significant for atrial fibrillation s/p ablation in 2019, coronary artery disease with chronically occluded RCA and PCI of D1 on 10/26/17, hypertension and hyperlipidemia who presented to the ED on 06/06/18 for a one hour history of chest pain radiating to bilateral jaw.  Reported heart rate at the time was in the 150s. Symptoms started around 6:15am and resolved approximately 1 hour after taking metoprolol dose.  Initial troponin was negative and second was elevated at .09. ECG on presentation revealed normal sinus rhythm.  She recently underwent an ablation on 02/08/18 and hadn't had any breakthroughs of atrial fibrillation until this morning.  Denies precipitating factors.  Currently reports being asymptomatic and denies chest pain, palpitations, shortness of breath, lower extremity swelling, dizziness, lightheadedness, or syncope/prescynope.  Doing well on Eliquis without evidence of hematuria or hematochezia.   Followed in outpatient cardiology by Dr. Saralyn Pilar.  Cardiac MRI on 02/06/18 revealed normal LV function with an EF estimated at 70% with mild LVH with no significant valvular abnormalities.   Review of systems complete and found to be negative unless listed above     Past Medical History:  Diagnosis Date  . Anginal pain ([place])   . Arthritis t   knee  . Asthma    mild  . Breast cancer ([place])   . 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 Mamie Nick. at [facility] in [place]  . Depression   . Dyspnea   . Dysrhythmia   . GERD (gastroesophageal reflux disease)   . Hypertension   .  Hypothyroidism   . 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  Marland Kitchen No pertinent past medical history   . Personal history of radiation therapy   . Sleep apnea t   O2- 2l at bedtime and BiPap @ bedtime    Past Surgical History:  Procedure Laterality Date  . BREAST BIOPSY Left 2/136/2018   INVASIVE MAMMARY CARCINOMA  . BREAST BIOPSY Right 05/25/2016   FIBROCYSTIC CHANGE WITH CALCIFICATIONS   . BREAST EXCISIONAL BIOPSY Left 06/04/2016   INVASIVE MAMMARY CARCINOMA.   Marland Kitchen BREAST LUMPECTOMY Left 06/04/2016   INVASIVE MAMMARY CARCINOMA.   Marland Kitchen CARDIAC CATHETERIZATION    . CARDIAC CATHETERIZATION N/A 09/24/2015   Procedure: Left Heart Cath and Coronary Angiography;  Surgeon: Isaias Cowman, MD;  Location: [facility];  Service: Cardiovascular;  Laterality: N/A;  . CARDIAC CATHETERIZATION N/A 09/24/2015   Procedure: Coronary Stent Intervention;  Surgeon: Isaias Cowman, MD;  Location: [facility];  Service: Cardiovascular;  Laterality: N/A;  . CHOLECYSTECTOMY  11/26   08/1970  . DILATATION & CURETTAGE/HYSTEROSCOPY WITH MYOSURE N/A 05/05/2015   Procedure: Hysteroscopy and endometrial curretage;  Surgeon: Boykin Nearing, MD;  Location: [facility];  Service: Gynecology;  Laterality: N/A;  . DILATION AND CURETTAGE OF UTERUS    . EXCISION MASS FROM NECK  Right    Dr. Tami Ribas  . EYE SURGERY  t   bil cataract 9/08  . HAMMER TOE SURGERY Right   . JOINT REPLACEMENT Bilateral 2008, 11/26/ 2012   Partial Knee Replacements  . LEFT HEART CATH  AND CORONARY ANGIOGRAPHY N/A 10/25/2017   Procedure: LEFT HEART CATH AND CORONARY ANGIOGRAPHY;  Surgeon: Teodoro Spray, MD;  Location: [facility];  Service: Cardiovascular;  Laterality: N/A;  . LEFT HEART CATH AND CORONARY ANGIOGRAPHY N/A 10/26/2017   Procedure: LEFT HEART CATH AND CORONARY ANGIOGRAPHY;  Surgeon: Isaias Cowman, MD;  Location: [facility];  Service:  Cardiovascular;  Laterality: N/A;  . PARTIAL MASTECTOMY WITH NEEDLE LOCALIZATION Left 06/04/2016   Procedure: PARTIAL MASTECTOMY WITH NEEDLE LOCALIZATION;  Surgeon: Leonie Green, MD;  Location: [facility];  Service: General;  Laterality: Left;  . SCLERAL BUCKLE  02/16/2011   Procedure: SCLERAL BUCKLE;  Surgeon: Hayden Pedro, MD;  Location: [facility] [place];  Service: Ophthalmology;  Laterality: Left;  Scleral Buckle Left Eye with Headscope Laser  . SENTINEL NODE BIOPSY Left 06/04/2016   Procedure: SENTINEL NODE BIOPSY;  Surgeon: Leonie Green, MD;  Location: [facility];  Service: General;  Laterality: Left;    Medications Prior to Admission  Medication Sig Dispense Refill Last Dose  . apixaban (ELIQUIS) 5 MG TABS tablet Take 5 mg by mouth 2 (two) times daily.   06/05/2018 at 1900  . aspirin 81 MG chewable tablet Chew 81 mg by mouth daily.   06/05/2018 at 0700  . atorvastatin (LIPITOR) 40 MG tablet Take 1 tablet (40 mg total) by mouth daily at 6 PM. 30 tablet 0 06/05/2018 at 1900  . diltiazem (CARDIZEM CD) 300 MG 24 hr capsule Take 300 mg by mouth daily.   06/05/2018 at 0700  . FERROUS SULFATE PO Take 325 mg by mouth once a week.   Past Week at Unknown time  . isosorbide mononitrate (IMDUR) 30 MG 24 hr tablet Take 30 mg by mouth daily.   06/05/2018 at 0700  . letrozole (FEMARA) 2.5 MG tablet TAKE 1 TABLET BY MOUTH ONCE DAILY 30 tablet 11 06/05/2018 at 0700  . levothyroxine (SYNTHROID, LEVOTHROID) 150 MCG tablet Take 150 mcg by mouth daily.     06/05/2018 at 0600  . Melatonin 5 MG TABS Take 5 mg by mouth at bedtime.   06/05/2018 at 2100  . metoprolol (TOPROL-XL) 100 MG 24 hr tablet Take 50 mg by mouth 2 (two) times daily.    06/06/2018 at 0700  . oxybutynin (DITROPAN) 5 MG tablet Take 5 mg by mouth daily.   06/05/2018 at 1900  . potassium chloride (K-DUR) 10 MEQ tablet Take 1 tablet (10 mEq total) by mouth daily. 30 tablet 5 06/05/2018 at 0700  . RABEprazole (ACIPHEX) 20 MG tablet Take 20 mg by mouth daily.      06/05/2018 at 0700  . sertraline (ZOLOFT) 50 MG tablet Take 50 mg by mouth at bedtime.     06/05/2018 at 1900  . torsemide (DEMADEX) 20 MG tablet Take 10 mg by mouth every morning.    06/05/2018 at 0700  . Acetaminophen (MAPAP) 500 MG coapsule Take by mouth.   prn at prn  . Fluticasone-Salmeterol (ADVAIR) 100-50 MCG/DOSE AEPB Inhale 1 puff into the lungs 2 (two) times daily as needed (for asthma.).   prn at prn  . nitroGLYCERIN (NITROSTAT) 0.4 MG SL tablet Place 0.4 mg under the tongue every 5 (five) minutes as needed for chest pain. Maximum 3 doses, If no relief call md or 911.   prn at prn  . sucralfate (CARAFATE) 1 g tablet Take 1 g by mouth 4 (four) times daily.      Social History   Socioeconomic History  .  Marital status: Widowed    Spouse name: Not on file  . Number of children: Not on file  . Years of education: Not on file  . Highest education level: Not on file  Occupational History  . Not on file  Social Needs  . Financial resource strain: Not on file  . Food insecurity:    Worry: Not on file    Inability: Not on file  . Transportation needs:    Medical: Not on file    Non-medical: Not on file  Tobacco Use  . Smoking status: Never Smoker  . Smokeless tobacco: Never Used  Substance and Sexual Activity  . Alcohol use: No  . Drug use: No  . Sexual activity: Not on file  Lifestyle  . Physical activity:    Days per week: Not on file    Minutes per session: Not on file  . Stress: Not on file  Relationships  . Social connections:    Talks on phone: Not on file    Gets together: Not on file    Attends religious service: Not on file    Active member of club or organization: Not on file    Attends meetings of clubs or organizations: Not on file    Relationship status: Not on file  . Intimate partner violence:    Fear of current or ex partner: Not on file    Emotionally abused: Not on file    Physically abused: Not on file    Forced sexual activity: Not on file   Other Topics Concern  . Not on file  Social History Narrative  . Not on file    Family History  Problem Relation Age of Onset  . Breast cancer Mother 17  . Heart disease Father   . Breast cancer Paternal Aunt 73  . Breast cancer Cousin   . Anesthesia problems Neg Hx   . Hypotension Neg Hx   . Malignant hyperthermia Neg Hx   . Pseudochol deficiency Neg Hx       Review of systems complete and found to be negative unless listed above      PHYSICAL EXAM  General: Well developed, well nourished, in no acute distress HEENT:  Normocephalic and atramatic Neck:  No JVD.  Lungs: Clear bilaterally to auscultation and percussion. Heart: HRRR . Normal S1 and S2 without gallops or murmurs.  Abdomen: Bowel sounds are positive, abdomen soft and non-tender  Msk:  Back normal, normal gait. Normal strength and tone for age. Extremities: No clubbing, cyanosis or edema.   Neuro: Alert and oriented X 3. Psych:  Good affect, responds appropriately  Labs:   Lab Results  Component Value Date   WBC 7.5 06/06/2018   HGB 13.1 06/06/2018   HCT 41.4 06/06/2018   MCV 79.5 (L) 06/06/2018   PLT 250 06/06/2018    Recent Labs  Lab 06/06/18 0825  NA 140  K 3.4*  CL 106  CO2 24  BUN 20  CREATININE 0.93  CALCIUM 9.5  GLUCOSE 95   Lab Results  Component Value Date   CKTOTAL 45 02/05/2014   CKMB < 0.5 (L) 02/05/2014   TROPONINI 0.09 (HH) 06/06/2018   No results found for: CHOL No results found for: HDL No results found for: LDLCALC No results found for: TRIG No results found for: CHOLHDL No results found for: LDLDIRECT    Radiology: Dg Chest 2 View  Result Date: 06/06/2018 CLINICAL DATA:  Chest pain.  Hypertension. EXAM: CHEST - 2  VIEW COMPARISON:  Chest radiograph October 23, 2017 and chest CT April 28, 2018 FINDINGS: There is atelectatic change in the left base. There is mild scarring in the right upper lobe. There is no edema or consolidation. Heart is upper normal in size with  pulmonary vascularity normal. No adenopathy. No bone lesions. IMPRESSION: Left base atelectasis. Mild scarring right upper lobe. No edema or consolidation. Stable cardiac silhouette. Electronically Signed   By: Lowella Grip III M.D.   On: 06/06/2018 08:51    EKG:  Currently in sinus rhythm   ASSESSMENT AND PLAN:  1.  Atrial fibrillation s/p ablation  -Currently in sinus rhythm, rate controlled  -Will continue with home medication regimen as well as Eliquis 5mg  BID  2.  Chest pain/elevated troponin   -Likely in the setting of atrial fibrillation with RVR; will trend third troponin; further recommendation pending results  3.  Hypokalemia (3.4)   -Recommend potassium supplementation   The history, physical exam findings, and plan of care were all discussed with Dr. Bartholome Bill, and all decision making was made in collaboration.   Signed: Avie Arenas PA-C 06/06/2018, 3:32 PM

## 2018-06-06 NOTE — Progress Notes (Signed)
CRITICAL VALUE ALERT  Critical Value:  Troponin 0.11  Date & Time Notied:  06/06/2018  Provider Notified: Dr. Ubaldo Glassing  Orders Received/Actions taken: no new orders received

## 2018-06-06 NOTE — Progress Notes (Signed)
Advance care planning  Purpose of Encounter CAD with chest pain and elevated troponin  Parties in Attendance Patient and brother  Patients Decisional capacity Letter and oriented.  Able to make medical decisions.  Documented healthcare power of attorney is her daughter Stacey Stone.  She has advance care planning documents in place.  Not available at this time  We discussed regarding patient's CAD, chest pain need for treatment.  Prognosis explained.  All questions answered.  We discussed regarding CODE STATUS and patient tells me that she would like to be DO NOT RESUSCITATE and DO NOT INTUBATE.  Daughter is aware.  DNR/DNI  Time spent - 17 minutes

## 2018-06-06 NOTE — ED Notes (Signed)
ED TO INPATIENT HANDOFF REPORT  ED Nurse Name and Phone #: Anda Kraft [phone]  S Name/Age/Gender Stacey Stone 73 y.o. female Room/Bed: ED25A/ED25A  Code Status   Code Status: DNR  Home/SNF/Other Home Patient oriented to: self, place, time and situation Is this baseline? Yes   Triage Complete: Triage complete  Chief Complaint chest pain  Triage Note Patient arrives via EMS with new onset chest pain. Patient with cardiac history; experienced jaw and neck pain then radiating arm/chest pain; described as aching pain. Patient took 1 nitro which did not relieve the CP. Fire/EMS gave 325 aspirin and patient took 50mg  metoprolol. Pertinent history of stent x1, atrial fibrillation with ablation 01/2018. Patient A&O x4 on arrival.    Allergies Allergies  Allergen Reactions  . Codeine Other (See Comments)    Stroke like symptoms    Level of Care/Admitting Diagnosis ED Disposition    ED Disposition Condition [facility] Area: [place] [[id number]]  Level of Care: Telemetry [5]  Diagnosis: Chest pain [846962]  Admitting Physician: Hillary Bow [[id number]]  Attending Physician: Hillary Bow [[id number]]  PT Class (Do Not Modify): Observation [104]  PT Acc Code (Do Not Modify): Observation [10022]       B Medical/Surgery History Past Medical History:  Diagnosis Date  . Anginal pain ([place])   . Arthritis t   knee  . Asthma    mild  . Breast cancer ([place])   . 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 Mamie Nick. at [facility] in [place]  . Depression   . Dyspnea   . Dysrhythmia   . GERD (gastroesophageal reflux disease)   . Hypertension   . Hypothyroidism   . 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  Marland Kitchen No pertinent past medical history   . Personal history of radiation therapy   . Sleep apnea t   O2- 2l at bedtime and BiPap @ bedtime   Past Surgical  History:  Procedure Laterality Date  . BREAST BIOPSY Left 2/136/2018   INVASIVE MAMMARY CARCINOMA  . BREAST BIOPSY Right 05/25/2016   FIBROCYSTIC CHANGE WITH CALCIFICATIONS   . BREAST EXCISIONAL BIOPSY Left 06/04/2016   INVASIVE MAMMARY CARCINOMA.   Marland Kitchen BREAST LUMPECTOMY Left 06/04/2016   INVASIVE MAMMARY CARCINOMA.   Marland Kitchen CARDIAC CATHETERIZATION    . CARDIAC CATHETERIZATION N/A 09/24/2015   Procedure: Left Heart Cath and Coronary Angiography;  Surgeon: Isaias Cowman, MD;  Location: [facility];  Service: Cardiovascular;  Laterality: N/A;  . CARDIAC CATHETERIZATION N/A 09/24/2015   Procedure: Coronary Stent Intervention;  Surgeon: Isaias Cowman, MD;  Location: [facility];  Service: Cardiovascular;  Laterality: N/A;  . CHOLECYSTECTOMY  11/26   08/1970  . DILATATION & CURETTAGE/HYSTEROSCOPY WITH MYOSURE N/A 05/05/2015   Procedure: Hysteroscopy and endometrial curretage;  Surgeon: Boykin Nearing, MD;  Location: [facility];  Service: Gynecology;  Laterality: N/A;  . DILATION AND CURETTAGE OF UTERUS    . EXCISION MASS FROM NECK  Right    Dr. Tami Ribas  . EYE SURGERY  t   bil cataract 9/08  . HAMMER TOE SURGERY Right   . JOINT REPLACEMENT Bilateral 2008, 11/26/ 2012   Partial Knee Replacements  . LEFT HEART CATH AND CORONARY ANGIOGRAPHY N/A 10/25/2017   Procedure: LEFT HEART CATH AND CORONARY ANGIOGRAPHY;  Surgeon: Teodoro Spray, MD;  Location: [facility];  Service: Cardiovascular;  Laterality: N/A;  .  LEFT HEART CATH AND CORONARY ANGIOGRAPHY N/A 10/26/2017   Procedure: LEFT HEART CATH AND CORONARY ANGIOGRAPHY;  Surgeon: Isaias Cowman, MD;  Location: [facility];  Service: Cardiovascular;  Laterality: N/A;  . PARTIAL MASTECTOMY WITH NEEDLE LOCALIZATION Left 06/04/2016   Procedure: PARTIAL MASTECTOMY WITH NEEDLE LOCALIZATION;  Surgeon: Leonie Green, MD;  Location: [facility];  Service: General;  Laterality: Left;  . SCLERAL BUCKLE   02/16/2011   Procedure: SCLERAL BUCKLE;  Surgeon: Hayden Pedro, MD;  Location: [facility];  Service: Ophthalmology;  Laterality: Left;  Scleral Buckle Left Eye with Headscope Laser  . SENTINEL NODE BIOPSY Left 06/04/2016   Procedure: SENTINEL NODE BIOPSY;  Surgeon: Leonie Green, MD;  Location: [facility];  Service: General;  Laterality: Left;     A IV Location/Drains/Wounds Patient Lines/Drains/Airways Status   Active Line/Drains/Airways    Name:   Placement date:   Placement time:   Site:   Days:   Peripheral IV 06/06/18 Right Hand   06/06/18    -    Hand   less than 1   Post Cath / Sheath 09/24/15 Right Arterial   09/24/15    0754    Arterial   986          Intake/Output Last 24 hours No intake or output data in the 24 hours ending 06/06/18 1345  Labs/Imaging Results for orders placed or performed during the hospital encounter of 06/06/18 (from the past 48 hour(s))  Basic metabolic panel     Status: Abnormal   Collection Time: 06/06/18  8:25 AM  Result Value Ref Range   Sodium 140 135 - 145 mmol/L   Potassium 3.4 (L) 3.5 - 5.1 mmol/L   Chloride 106 98 - 111 mmol/L   CO2 24 22 - 32 mmol/L   Glucose, Bld 95 70 - 99 mg/dL   BUN 20 8 - 23 mg/dL   Creatinine, Ser 0.93 0.44 - 1.00 mg/dL   Calcium 9.5 8.9 - 10.3 mg/dL   GFR calc non Af Amer >60 >60 mL/min   GFR calc Af Amer >60 >60 mL/min   Anion gap 10 5 - 15    Comment: Performed at [facility], [address] [place], [place] [address]  CBC     Status: Abnormal   Collection Time: 06/06/18  8:25 AM  Result Value Ref Range   WBC 7.5 4.0 - 10.5 K/uL   RBC 5.21 (H) 3.87 - 5.11 MIL/uL   Hemoglobin 13.1 12.0 - 15.0 g/dL   HCT 41.4 36.0 - 46.0 %   MCV 79.5 (L) 80.0 - 100.0 fL   MCH 25.1 (L) 26.0 - 34.0 pg   MCHC 31.6 30.0 - 36.0 g/dL   RDW 16.0 (H) 11.5 - 15.5 %   Platelets 250 150 - 400 K/uL   nRBC 0.0 0.0 - 0.2 %    Comment: Performed at [facility], [address] [place], [place] [address]   Troponin I - ONCE - STAT     Status: None   Collection Time: 06/06/18  8:25 AM  Result Value Ref Range   Troponin I <0.03 <0.03 ng/mL    Comment: Performed at [facility], [address] [place], [place] [address]  Troponin I - Once-Timed     Status: Abnormal   Collection Time: 06/06/18 11:33 AM  Result Value Ref Range   Troponin I 0.09 (HH) <0.03 ng/mL    Comment: CRITICAL RESULT CALLED TO, READ BACK BY  AND VERIFIED WITH BILL SMITH RN 06/06/2018 @ 1230 RDW/KLW Performed at [facility], [address] [place], [place] [address]    Dg Chest 2 View  Result Date: 06/06/2018 CLINICAL DATA:  Chest pain.  Hypertension. EXAM: CHEST - 2 VIEW COMPARISON:  Chest radiograph October 23, 2017 and chest CT April 28, 2018 FINDINGS: There is atelectatic change in the left base. There is mild scarring in the right upper lobe. There is no edema or consolidation. Heart is upper normal in size with pulmonary vascularity normal. No adenopathy. No bone lesions. IMPRESSION: Left base atelectasis. Mild scarring right upper lobe. No edema or consolidation. Stable cardiac silhouette. Electronically Signed   By: Lowella Grip III M.D.   On: 06/06/2018 08:51    Pending Labs Unresulted Labs (From admission, onward)    Start     Ordered   06/07/18 6045  Basic metabolic panel  Tomorrow morning,   STAT     06/06/18 1320   06/07/18 0500  CBC  Tomorrow morning,   STAT     06/06/18 1320   06/06/18 1800  Troponin I - Once-Timed  Once-Timed,   STAT     06/06/18 1321   06/06/18 1320  Magnesium  Add-on,   AD     06/06/18 1319          Vitals/Pain Today's Vitals   06/06/18 1100 06/06/18 1200 06/06/18 1230 06/06/18 1300  BP: (!) 138/96 (!) 151/91 (!) 148/93 (!) 130/111  Pulse: 64 61 64 (!) 55  Resp: 17 17 (!) 24 17  Temp:      TempSrc:      SpO2: 97% 97% 93% 99%  Weight:      Height:      PainSc:        Isolation Precautions No active isolations  Medications Medications   potassium chloride SA (K-DUR,KLOR-CON) CR tablet 40 mEq (has no administration in time range)  sodium chloride flush (NS) 0.9 % injection 3 mL (has no administration in time range)  acetaminophen (TYLENOL) tablet 650 mg (has no administration in time range)    Or  acetaminophen (TYLENOL) suppository 650 mg (has no administration in time range)  polyethylene glycol (MIRALAX / GLYCOLAX) packet 17 g (has no administration in time range)  ondansetron (ZOFRAN) tablet 4 mg (has no administration in time range)    Or  ondansetron (ZOFRAN) injection 4 mg (has no administration in time range)  albuterol (PROVENTIL) (2.5 MG/3ML) 0.083% nebulizer solution 2.5 mg (has no administration in time range)    Mobility walks Low fall risk   Focused Assessments Cardiac Assessment Handoff:  Cardiac Rhythm: Normal sinus rhythm Lab Results  Component Value Date   CKTOTAL 45 02/05/2014   CKMB < 0.5 (L) 02/05/2014   TROPONINI 0.09 (HH) 06/06/2018   No results found for: DDIMER Does the Patient currently have chest pain? No     R Recommendations: See Admitting Provider Note  Report given to:   Additional Notes:

## 2018-06-06 NOTE — ED Triage Notes (Signed)
Patient arrives via EMS with new onset chest pain. Patient with cardiac history; experienced jaw and neck pain then radiating arm/chest pain; described as aching pain. Patient took 1 nitro which did not relieve the CP. Fire/EMS gave 325 aspirin and patient took 50mg  metoprolol. Pertinent history of stent x1, atrial fibrillation with ablation 01/2018. Patient A&O x4 on arrival.

## 2018-06-07 ENCOUNTER — Encounter (INDEPENDENT_AMBULATORY_CARE_PROVIDER_SITE_OTHER): Payer: 59 | Admitting: Ophthalmology

## 2018-06-07 DIAGNOSIS — R749 Abnormal serum enzyme level, unspecified: Secondary | ICD-10-CM | POA: Diagnosis not present

## 2018-06-07 DIAGNOSIS — R079 Chest pain, unspecified: Secondary | ICD-10-CM | POA: Diagnosis not present

## 2018-06-07 DIAGNOSIS — I4891 Unspecified atrial fibrillation: Secondary | ICD-10-CM | POA: Diagnosis not present

## 2018-06-07 DIAGNOSIS — R7989 Other specified abnormal findings of blood chemistry: Secondary | ICD-10-CM | POA: Diagnosis not present

## 2018-06-07 LAB — BASIC METABOLIC PANEL
Anion gap: 6 (ref 5–15)
BUN: 21 mg/dL (ref 8–23)
CO2: 26 mmol/L (ref 22–32)
Calcium: 9 mg/dL (ref 8.9–10.3)
Chloride: 108 mmol/L (ref 98–111)
Creatinine, Ser: 0.79 mg/dL (ref 0.44–1.00)
GFR calc Af Amer: >60 mL/min (ref >60)
GFR calc non Af Amer: >60 mL/min (ref >60)
Glucose, Bld: 128 mg/dL — ABNORMAL HIGH (ref 70–99)
Potassium: 4.2 mmol/L (ref 3.5–5.1)
Sodium: 140 mmol/L (ref 135–145)

## 2018-06-07 LAB — CBC
HCT: 39 % (ref 36.0–46.0)
Hemoglobin: 12 g/dL (ref 12.0–15.0)
MCH: 25.4 pg — ABNORMAL LOW (ref 26.0–34.0)
MCHC: 30.8 g/dL (ref 30.0–36.0)
MCV: 82.6 fL (ref 80.0–100.0)
Platelets: 238 10*3/uL (ref 150–400)
RBC: 4.72 MIL/uL (ref 3.87–5.11)
RDW: 16.2 % — ABNORMAL HIGH (ref 11.5–15.5)
WBC: 10.2 10*3/uL (ref 4.0–10.5)
nRBC: 0 % (ref 0.0–0.2)

## 2018-06-07 LAB — TSH: TSH: 0.388 u[IU]/mL (ref 0.350–4.500)

## 2018-06-07 LAB — TROPONIN I: Troponin I: 0.06 ng/mL (ref <0.03)

## 2018-06-07 MED ORDER — DILTIAZEM HCL 30 MG PO TABS: 30.0000 mg | ORAL_TABLET | Freq: Two times a day (BID) | ORAL | 0 refills | Status: DC | PRN

## 2018-06-07 MED ORDER — DILTIAZEM HCL 30 MG PO TABS: 30.0000 mg | ORAL_TABLET | Freq: Two times a day (BID) | ORAL | Status: DC | PRN

## 2018-06-07 NOTE — Discharge Instructions (Signed)
Continue your home medications, including metoprolol, cardizem, and Eliquis.   For breakthrough atrial fibrillation, take Cardizem 30 mg by mouth only as needed, up to 2 times daily.   We discussed that if you feel the familiar sensation in your jaw and hands, you can use your heart monitor to check your heart rate and take this as-needed pill if your heart rate is high.   Please follow-up with Dr. Saralyn Pilar in 2 weeks.  Follow up with Dr. Sabra Heck for general hospital follow-up in 1 week.

## 2018-06-07 NOTE — Discharge Summary (Signed)
[place] [facility] [place] NAME: Stacey Stone    MR#:  [id number]  DATE OF BIRTH:  Dec 03, 1945  DATE OF ADMISSION:  06/06/2018   ADMITTING PHYSICIAN: Hillary Bow, MD  DATE OF DISCHARGE: 06/07/2018 12:10 PM  PRIMARY CARE PHYSICIAN: Rusty Aus, MD   ADMISSION DIAGNOSIS:  NSTEMI (non-ST elevated myocardial infarction) ([place]) [I21.4] DISCHARGE DIAGNOSIS:  Active Problems:   Paroxysmal atrial fibrillation ([place])  SECONDARY DIAGNOSIS:   Past Medical History:  Diagnosis Date  . Anginal pain ([place])   . Arthritis t   knee  . Asthma    mild  . Breast cancer ([place])   . 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 Mamie Nick. at [facility] in [place]  . Depression   . Dyspnea   . Dysrhythmia   . GERD (gastroesophageal reflux disease)   . Hypertension   . Hypothyroidism   . 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  Marland Kitchen No pertinent past medical history   . Personal history of radiation therapy   . Sleep apnea t   O2- 2l at bedtime and BiPap @ bedtime   HOSPITAL COURSE:   Stacey Stone is a 73 year old female with a past medical history significant for atrial fibrillation s/p ablation in 2019, coronary artery disease with occluded RCA and PCI on 10/26/17, hypertension, hyperlipidemia, GERD, OSA who presented to the ED on 06/06/18 for a one hour history of chest pain radiating to bilateral jaw and arms which responded to metoprolol. Reported heart rate at the time was in the 150s. First episode of breakthrough atrial fibrillation since ablation.On Eliquis.   Followed in outpatient cardiology by Dr. Saralyn Pilar. Cardiac MRI on 02/06/18 revealed normal LV function with an EF estimated at 70% with mild LVH with no significant valvular abnormalities.   Chest pain Troponin 0.03->0.09->0.11->0.10->0.06. EKG without ST elevation or depression.   Appreciate cardiology  consult: elevated troponin likely in the setting of Afib with RVR, no need for further ischemic work-up per Dr. Ubaldo Glassing Outpatient follow-up in 2 weeks with Dr. Saralyn Pilar PCP follow-up within 5 days  Afib with RVR Continue Eliquis 5 mg BID, metoprolol, cardizem, added cardizem 30 mg PO BID PRN for breakthrough Afib. Has a HR monitor at home. In NSR on the day of discharge. Stone ambulated with me in the hallway prior to discharge, no recurrence of symptoms   Hypothyroidism TSH within normal limits continue home regimen  Hyperkalemia Repleted, 4.2.   DVT prophylaxis Eliquis  DISCHARGE CONDITIONS:  Stable  CONSULTS OBTAINED:  Treatment Team:  Teodoro Spray, MD DRUG ALLERGIES:   Allergies  Allergen Reactions  . Codeine Other (See Comments)    Stroke like symptoms   DISCHARGE MEDICATIONS:   Allergies as of 06/07/2018      Reactions   Codeine Other (See Comments)   Stroke like symptoms      Medication List    TAKE these medications   aspirin 81 MG chewable tablet Chew 81 mg by mouth daily.   atorvastatin 40 MG tablet Commonly known as:  LIPITOR Take 1 tablet (40 mg total) by mouth daily at 6 PM.   diltiazem 30 MG tablet Commonly known as:  CARDIZEM Take 1 tablet (30 mg total) by mouth 2 (two) times daily as needed for up to 30 days (for breakthrough afib).   diltiazem 300 MG 24 hr capsule Commonly known as:  CARDIZEM  CD Take 300 mg by mouth daily.   Eliquis 5 MG Tabs tablet Generic drug:  apixaban Take 5 mg by mouth 2 (two) times daily.   FERROUS SULFATE PO Take 325 mg by mouth once a week.   Fluticasone-Salmeterol 100-50 MCG/DOSE Aepb Commonly known as:  ADVAIR Inhale 1 puff into the lungs 2 (two) times daily as needed (for asthma.).   isosorbide mononitrate 30 MG 24 hr tablet Commonly known as:  IMDUR Take 30 mg by mouth daily.   letrozole 2.5 MG tablet Commonly known as:  FEMARA TAKE 1 TABLET BY MOUTH ONCE DAILY   levothyroxine 150 MCG tablet  Commonly known as:  SYNTHROID, LEVOTHROID Take 150 mcg by mouth daily.   Mapap 500 MG coapsule Generic drug:  Acetaminophen Take by mouth.   Melatonin 5 MG Tabs Take 5 mg by mouth at bedtime.   metoprolol succinate 100 MG 24 hr tablet Commonly known as:  TOPROL-XL Take 50 mg by mouth 2 (two) times daily.   nitroGLYCERIN 0.4 MG SL tablet Commonly known as:  NITROSTAT Place 0.4 mg under the tongue every 5 (five) minutes as needed for chest pain. Maximum 3 doses, If no relief call md or 911.   oxybutynin 5 MG tablet Commonly known as:  DITROPAN Take 5 mg by mouth daily.   potassium chloride 10 MEQ tablet Commonly known as:  K-DUR Take 1 tablet (10 mEq total) by mouth daily.   RABEprazole 20 MG tablet Commonly known as:  ACIPHEX Take 20 mg by mouth daily.   sertraline 50 MG tablet Commonly known as:  ZOLOFT Take 50 mg by mouth at bedtime.   sucralfate 1 g tablet Commonly known as:  CARAFATE Take 1 g by mouth 4 (four) times daily.   torsemide 20 MG tablet Commonly known as:  DEMADEX Take 10 mg by mouth every morning.        DISCHARGE INSTRUCTIONS:   DIET:  Cardiac diet DISCHARGE CONDITION:  Stable ACTIVITY:  Activity as tolerated OXYGEN:  Home Oxygen: No.  Oxygen Delivery: room air DISCHARGE LOCATION:  home   If you experience worsening of your admission symptoms, develop shortness of breath, life threatening emergency, suicidal or homicidal thoughts you must seek medical attention immediately by calling 911 or calling your MD immediately if your symptoms are severe.  You Must read complete instructions/literature along with all the possible adverse reactions/side effects for all the medicines you take and that have been prescribed to you. Take any new medicines only after you have completely understood and accept all the possible adverse reactions/side effects.   Please note  You were cared for by a hospitalist during your hospital stay. If you have any  questions about your discharge medications or the care you received while you were in the hospital after you are discharged, you can call the unit and asked to speak with the hospitalist on call if the hospitalist that took care of you is not available. Once you are discharged, your primary care physician will handle any further medical issues. Please note that NO REFILLS for any discharge medications will be authorized once you are discharged, as it is imperative that you return to your primary care physician (or establish a relationship with a primary care physician if you do not have one) for your aftercare needs so that they can reassess your need for medications and monitor your lab values.    On the day of Discharge:  VITAL SIGNS:  Blood pressure 120/62, pulse Marland Kitchen)  51, temperature 97.7 F (36.5 C), temperature source Oral, resp. rate 19, height 5\' 3"  (1.6 m), weight 82.1 kg, SpO2 99 %. PHYSICAL EXAMINATION:  GENERAL:  Stacey Stone sitting up in bed with no acute distress.  EYES: Pupils equal, round, reactive to light and accommodation. No scleral icterus. Extraocular muscles intact.  HEENT: Head atraumatic, normocephalic. Oropharynx and nasopharynx clear.  NECK:  Supple, no jugular venous distention. No thyroid enlargement, no tenderness.  LUNGS: Normal breath sounds bilaterally, no wheezing, rales,rhonchi or crepitation. No use of accessory muscles of respiration.  CARDIOVASCULAR: S1, S2 normal. No murmurs, rubs, or gallops.  ABDOMEN: Soft, non-tender, non-distended. Bowel sounds present. No organomegaly or mass.  EXTREMITIES: No pedal edema, cyanosis, or clubbing.  NEUROLOGIC: Cranial nerves II through XII are intact. Muscle strength 5/5 in all extremities. Sensation intact. Gait not checked.  PSYCHIATRIC: The Stone is alert and oriented x 3.  SKIN: No obvious rash, lesion, or ulcer.  DATA REVIEW:   CBC Recent Labs  Lab 06/07/18 0455  WBC 10.2  HGB 12.0  HCT 39.0   PLT 238    Chemistries  Recent Labs  Lab 06/06/18 1133 06/07/18 0455  NA  --  140  K  --  4.2  CL  --  108  CO2  --  26  GLUCOSE  --  128*  BUN  --  21  CREATININE  --  0.79  CALCIUM  --  9.0  MG 2.4  --     RADIOLOGY:  No results found.   Management plans discussed with the Stone and/or family and they are in agreement.  CODE STATUS: Prior   TOTAL TIME TAKING CARE OF THIS Stone: 45 minutes.    Ripley Fraise PA-C on 06/07/2018 at 9:38 PM  Between 7am to 6pm - Pager - [phone]  After 6pm go to [email or web address] - Proofreader  [facility]  Office  [phone]  CC: Primary care physician; Rusty Aus, MD

## 2018-06-07 NOTE — Progress Notes (Signed)
Patient Name: Stacey Stone Date of Encounter: 06/07/2018  Hospital Problem List     Active Problems:   Chest pain    Patient Profile     Patient with history of paroxysmal atrial fibrillation status post ablation 4 months ago.  Had recurrent atrial fibrillation.  Continued with apixaban as well as metoprolol and diltiazem for rhythm and rate control.  She spontaneously converted back to sinus rhythm.  She is doing well remains in sinus rhythm.  Subjective   No complaints  Inpatient Medications    . apixaban  5 mg Oral BID  . aspirin  81 mg Oral Daily  . atorvastatin  40 mg Oral q1800  . diltiazem  300 mg Oral Daily  . isosorbide mononitrate  30 mg Oral Daily  . letrozole  2.5 mg Oral Daily  . levothyroxine  150 mcg Oral Daily  . Melatonin  5 mg Oral QHS  . metoprolol succinate  50 mg Oral BID  . mometasone-formoterol  2 puff Inhalation BID  . oxybutynin  5 mg Oral Daily  . pantoprazole  40 mg Oral Daily  . potassium chloride  10 mEq Oral Daily  . sertraline  50 mg Oral QHS  . sodium chloride flush  3 mL Intravenous Q12H  . torsemide  10 mg Oral q morning - 10a    Vital Signs    Vitals:   06/06/18 2052 06/07/18 0445 06/07/18 0711 06/07/18 0752  BP: 120/76 120/73  120/62  Pulse: (!) 56 (!) 58  (!) 51  Resp: 18 18  19   Temp: 98 F (36.7 C) 97.9 F (36.6 C)  97.7 F (36.5 C)  TempSrc: Oral Oral  Oral  SpO2: 97% 98%  99%  Weight:   82.1 kg   Height:        Intake/Output Summary (Last 24 hours) at 06/07/2018 0941 Last data filed at 06/07/2018 0137 Gross per 24 hour  Intake -  Output 400 ml  Net -400 ml   Filed Weights   06/06/18 0818 06/06/18 1521 06/07/18 0711  Weight: 80.3 kg 82.1 kg 82.1 kg    Physical Exam    GEN: Well nourished, well developed, in no acute distress.  HEENT: normal.  Neck: Supple, no JVD, carotid bruits, or masses. Cardiac: RRR, no murmurs, rubs, or gallops. No clubbing, cyanosis, edema.  Radials/DP/PT 2+ and equal  bilaterally.  Respiratory:  Respirations regular and unlabored, clear to auscultation bilaterally. GI: Soft, nontender, nondistended, BS + x 4. MS: no deformity or atrophy. Skin: warm and dry, no rash. Neuro:  Strength and sensation are intact. Psych: Normal affect.  Labs    CBC Recent Labs    06/06/18 0825 06/07/18 0455  WBC 7.5 10.2  HGB 13.1 12.0  HCT 41.4 39.0  MCV 79.5* 82.6  PLT 250 144   Basic Metabolic Panel Recent Labs    06/06/18 0825 06/06/18 1133 06/07/18 0455  NA 140  --  140  K 3.4*  --  4.2  CL 106  --  108  CO2 24  --  26  GLUCOSE 95  --  128*  BUN 20  --  21  CREATININE 0.93  --  0.79  CALCIUM 9.5  --  9.0  MG  --  2.4  --    Liver Function Tests No results for input(s): AST, ALT, ALKPHOS, BILITOT, PROT, ALBUMIN in the last 72 hours. No results for input(s): LIPASE, AMYLASE in the last 72 hours. Cardiac Enzymes Recent Labs  06/06/18 1746 06/06/18 2123 06/07/18 0455  TROPONINI 0.11* 0.10* 0.06*   BNP No results for input(s): BNP in the last 72 hours. D-Dimer No results for input(s): DDIMER in the last 72 hours. Hemoglobin A1C No results for input(s): HGBA1C in the last 72 hours. Fasting Lipid Panel No results for input(s): CHOL, HDL, LDLCALC, TRIG, CHOLHDL, LDLDIRECT in the last 72 hours. Thyroid Function Tests No results for input(s): TSH, T4TOTAL, T3FREE, THYROIDAB in the last 72 hours.  Invalid input(s): FREET3  Telemetry    Sinus rhythm  ECG    Sinus rhythm  Radiology    Dg Chest 2 View  Result Date: 06/06/2018 CLINICAL DATA:  Chest pain.  Hypertension. EXAM: CHEST - 2 VIEW COMPARISON:  Chest radiograph October 23, 2017 and chest CT April 28, 2018 FINDINGS: There is atelectatic change in the left base. There is mild scarring in the right upper lobe. There is no edema or consolidation. Heart is upper normal in size with pulmonary vascularity normal. No adenopathy. No bone lesions. IMPRESSION: Left base atelectasis. Mild  scarring right upper lobe. No edema or consolidation. Stable cardiac silhouette. Electronically Signed   By: Lowella Grip III M.D.   On: 06/06/2018 08:51    Assessment & Plan    74 year old female with history of A. fib status post ablation.  Had a breakthrough episode of A. fib converted back to sinus rhythm.  Has remained on Cardizem and metoprolol long-acting at current doses.  Would continue with Eliquis and current regimen of medications.  Will add Cardizem 30 mg p.o. for breakthrough.  Ambulate and discharge today with outpatient follow-up with Dr. Saralyn Pilar. Signed, Javier Docker [employer] MD 06/07/2018, 9:41 AM  Pager: [phone]

## 2018-06-07 NOTE — Plan of Care (Signed)
Pt ready for discharge.   Problem: Education: Goal: Knowledge of General Education information will improve Description Including pain rating scale, medication(s)/side effects and non-pharmacologic comfort measures Outcome: Completed/Met   Problem: Health Behavior/Discharge Planning: Goal: Ability to manage health-related needs will improve Outcome: Completed/Met   Problem: Clinical Measurements: Goal: Ability to maintain clinical measurements within normal limits will improve Outcome: Completed/Met Goal: Will remain free from infection Outcome: Completed/Met Goal: Diagnostic test results will improve Outcome: Completed/Met Goal: Respiratory complications will improve Outcome: Completed/Met Goal: Cardiovascular complication will be avoided Outcome: Completed/Met   Problem: Activity: Goal: Risk for activity intolerance will decrease Outcome: Completed/Met   Problem: Nutrition: Goal: Adequate nutrition will be maintained Outcome: Completed/Met   Problem: Coping: Goal: Level of anxiety will decrease Outcome: Completed/Met   Problem: Elimination: Goal: Will not experience complications related to bowel motility Outcome: Completed/Met Goal: Will not experience complications related to urinary retention Outcome: Completed/Met   Problem: Pain Managment: Goal: General experience of comfort will improve Outcome: Completed/Met   Problem: Safety: Goal: Ability to remain free from injury will improve Outcome: Completed/Met   Problem: Skin Integrity: Goal: Risk for impaired skin integrity will decrease Outcome: Completed/Met   Problem: Education: Goal: Understanding of cardiac disease, CV risk reduction, and recovery process will improve Outcome: Completed/Met Goal: Individualized Educational Video(s) Outcome: Completed/Met   Problem: Activity: Goal: Ability to tolerate increased activity will improve Outcome: Completed/Met   Problem: Cardiac: Goal: Ability to  achieve and maintain adequate cardiovascular perfusion will improve Outcome: Completed/Met   Problem: Health Behavior/Discharge Planning: Goal: Ability to safely manage health-related needs after discharge will improve Outcome: Completed/Met

## 2018-06-13 DIAGNOSIS — I214 Non-ST elevation (NSTEMI) myocardial infarction: Secondary | ICD-10-CM | POA: Diagnosis not present

## 2018-06-13 DIAGNOSIS — I480 Paroxysmal atrial fibrillation: Secondary | ICD-10-CM | POA: Diagnosis not present

## 2018-06-13 DIAGNOSIS — R55 Syncope and collapse: Secondary | ICD-10-CM | POA: Diagnosis not present

## 2018-06-14 ENCOUNTER — Encounter (INDEPENDENT_AMBULATORY_CARE_PROVIDER_SITE_OTHER): Payer: 59 | Admitting: Ophthalmology

## 2018-06-14 ENCOUNTER — Other Ambulatory Visit: Payer: Self-pay

## 2018-06-14 DIAGNOSIS — I10 Essential (primary) hypertension: Secondary | ICD-10-CM

## 2018-06-14 DIAGNOSIS — H34812 Central retinal vein occlusion, left eye, with macular edema: Secondary | ICD-10-CM | POA: Diagnosis not present

## 2018-06-14 DIAGNOSIS — H35033 Hypertensive retinopathy, bilateral: Secondary | ICD-10-CM | POA: Diagnosis not present

## 2018-06-14 DIAGNOSIS — H43813 Vitreous degeneration, bilateral: Secondary | ICD-10-CM

## 2018-06-14 DIAGNOSIS — H338 Other retinal detachments: Secondary | ICD-10-CM | POA: Diagnosis not present

## 2018-06-19 DIAGNOSIS — I2510 Atherosclerotic heart disease of native coronary artery without angina pectoris: Secondary | ICD-10-CM | POA: Diagnosis not present

## 2018-06-19 DIAGNOSIS — I208 Other forms of angina pectoris: Secondary | ICD-10-CM | POA: Diagnosis not present

## 2018-06-19 DIAGNOSIS — I480 Paroxysmal atrial fibrillation: Secondary | ICD-10-CM | POA: Diagnosis not present

## 2018-06-22 DIAGNOSIS — G4733 Obstructive sleep apnea (adult) (pediatric): Secondary | ICD-10-CM | POA: Diagnosis not present

## 2018-06-22 DIAGNOSIS — I480 Paroxysmal atrial fibrillation: Secondary | ICD-10-CM | POA: Diagnosis not present

## 2018-06-22 DIAGNOSIS — E538 Deficiency of other specified B group vitamins: Secondary | ICD-10-CM | POA: Diagnosis not present

## 2018-06-26 DIAGNOSIS — I480 Paroxysmal atrial fibrillation: Secondary | ICD-10-CM | POA: Diagnosis not present

## 2018-07-04 DIAGNOSIS — I480 Paroxysmal atrial fibrillation: Secondary | ICD-10-CM | POA: Diagnosis not present

## 2018-07-19 ENCOUNTER — Other Ambulatory Visit: Payer: Self-pay

## 2018-07-19 ENCOUNTER — Encounter (INDEPENDENT_AMBULATORY_CARE_PROVIDER_SITE_OTHER): Payer: 59 | Admitting: Ophthalmology

## 2018-07-19 DIAGNOSIS — H34812 Central retinal vein occlusion, left eye, with macular edema: Secondary | ICD-10-CM | POA: Diagnosis not present

## 2018-07-19 DIAGNOSIS — H34831 Tributary (branch) retinal vein occlusion, right eye, with macular edema: Secondary | ICD-10-CM

## 2018-07-19 DIAGNOSIS — I10 Essential (primary) hypertension: Secondary | ICD-10-CM | POA: Diagnosis not present

## 2018-07-19 DIAGNOSIS — H35033 Hypertensive retinopathy, bilateral: Secondary | ICD-10-CM

## 2018-07-19 DIAGNOSIS — H43813 Vitreous degeneration, bilateral: Secondary | ICD-10-CM

## 2018-08-03 DIAGNOSIS — E782 Mixed hyperlipidemia: Secondary | ICD-10-CM | POA: Diagnosis not present

## 2018-08-03 DIAGNOSIS — R739 Hyperglycemia, unspecified: Secondary | ICD-10-CM | POA: Diagnosis not present

## 2018-08-10 DIAGNOSIS — I208 Other forms of angina pectoris: Secondary | ICD-10-CM | POA: Diagnosis not present

## 2018-08-10 DIAGNOSIS — G4733 Obstructive sleep apnea (adult) (pediatric): Secondary | ICD-10-CM | POA: Diagnosis not present

## 2018-08-10 DIAGNOSIS — E782 Mixed hyperlipidemia: Secondary | ICD-10-CM | POA: Diagnosis not present

## 2018-08-30 ENCOUNTER — Other Ambulatory Visit: Payer: Self-pay

## 2018-08-30 ENCOUNTER — Encounter (INDEPENDENT_AMBULATORY_CARE_PROVIDER_SITE_OTHER): Payer: 59 | Admitting: Ophthalmology

## 2018-08-30 DIAGNOSIS — I10 Essential (primary) hypertension: Secondary | ICD-10-CM

## 2018-08-30 DIAGNOSIS — H34831 Tributary (branch) retinal vein occlusion, right eye, with macular edema: Secondary | ICD-10-CM

## 2018-08-30 DIAGNOSIS — H34812 Central retinal vein occlusion, left eye, with macular edema: Secondary | ICD-10-CM

## 2018-08-30 DIAGNOSIS — H43813 Vitreous degeneration, bilateral: Secondary | ICD-10-CM

## 2018-08-30 DIAGNOSIS — H35033 Hypertensive retinopathy, bilateral: Secondary | ICD-10-CM | POA: Diagnosis not present

## 2018-09-15 ENCOUNTER — Other Ambulatory Visit: Payer: Self-pay | Admitting: Oncology

## 2018-10-04 ENCOUNTER — Encounter (INDEPENDENT_AMBULATORY_CARE_PROVIDER_SITE_OTHER): Payer: 59 | Admitting: Ophthalmology

## 2018-10-12 ENCOUNTER — Other Ambulatory Visit: Payer: Self-pay

## 2018-10-12 ENCOUNTER — Encounter (INDEPENDENT_AMBULATORY_CARE_PROVIDER_SITE_OTHER): Payer: 59 | Admitting: Ophthalmology

## 2018-10-12 DIAGNOSIS — H35033 Hypertensive retinopathy, bilateral: Secondary | ICD-10-CM

## 2018-10-12 DIAGNOSIS — H34812 Central retinal vein occlusion, left eye, with macular edema: Secondary | ICD-10-CM

## 2018-10-12 DIAGNOSIS — H34831 Tributary (branch) retinal vein occlusion, right eye, with macular edema: Secondary | ICD-10-CM | POA: Diagnosis not present

## 2018-10-12 DIAGNOSIS — I10 Essential (primary) hypertension: Secondary | ICD-10-CM

## 2018-10-12 DIAGNOSIS — H43813 Vitreous degeneration, bilateral: Secondary | ICD-10-CM

## 2018-10-16 ENCOUNTER — Ambulatory Visit: Payer: 59 | Admitting: Radiation Oncology

## 2018-10-18 ENCOUNTER — Ambulatory Visit
Admission: RE | Admit: 2018-10-18 | Discharge: 2018-10-18 | Disposition: A | Discharge status: Home / Self Care | Payer: 59 | Source: Ambulatory Visit | Attending: Radiation Oncology | Admitting: Radiation Oncology

## 2018-10-18 ENCOUNTER — Encounter: Payer: Self-pay | Admitting: Radiation Oncology

## 2018-10-18 ENCOUNTER — Other Ambulatory Visit: Payer: Self-pay

## 2018-10-18 VITALS — BP 130/68 | HR 57 | Temp 98.6°F | Resp 18 | Wt 188.3 lb

## 2018-10-18 DIAGNOSIS — Z923 Personal history of irradiation: Secondary | ICD-10-CM | POA: Insufficient documentation

## 2018-10-18 DIAGNOSIS — C50412 Malignant neoplasm of upper-outer quadrant of left female breast: Secondary | ICD-10-CM | POA: Diagnosis present

## 2018-10-18 DIAGNOSIS — Z79811 Long term (current) use of aromatase inhibitors: Secondary | ICD-10-CM | POA: Diagnosis not present

## 2018-10-18 DIAGNOSIS — Z170 Estrogen receptor positive status [ER+]: Secondary | ICD-10-CM | POA: Diagnosis not present

## 2018-10-18 NOTE — Progress Notes (Signed)
Radiation Oncology Follow up Note  Name: Stacey Stone   Date:   10/18/2018 MRN:  [id number] DOB: 1945-07-23    This 73 y.o. female presents to the clinic today for 2-year follow-up status post whole breast radiation to her left breast for ER PR positive invasive mammary carcinoma.  REFERRING PROVIDER: Rusty Aus, MD  HPI: Patient is a 73 year old female now at 2 years having completed whole breast radiation to her left breast for ER PR positive invasive mammary carcinoma.  Seen today in routine follow-up she is doing well.  She specifically denies breast tenderness cough or bone pain..  She is currently on Femara tolerating that well without side effect.  Her last mammogram was back in February was BI-RADS 2 benign.  COMPLICATIONS OF TREATMENT: none  FOLLOW UP COMPLIANCE: keeps appointments   PHYSICAL EXAM:  BP 130/68 (BP Location: Left Arm, Patient Position: Sitting)   Pulse (!) 57   Temp 98.6 F (37 C) (Tympanic)   Resp 18   Wt 188 lb 4.8 oz (85.4 kg)   BMI 33.36 kg/m  Lungs are clear to A&P cardiac examination essentially unremarkable with regular rate and rhythm. No dominant mass or nodularity is noted in either breast in 2 positions examined. Incision is well-healed. No axillary or supraclavicular adenopathy is appreciated. Cosmetic result is excellent.  Well-developed well-nourished patient in NAD. HEENT reveals PERLA, EOMI, discs not visualized.  Oral cavity is clear. No oral mucosal lesions are identified. Neck is clear without evidence of cervical or supraclavicular adenopathy. Lungs are clear to A&P. Cardiac examination is essentially unremarkable with regular rate and rhythm without murmur rub or thrill. Abdomen is benign with no organomegaly or masses noted. Motor sensory and DTR levels are equal and symmetric in the upper and lower extremities. Cranial nerves II through XII are grossly intact. Proprioception is intact. No peripheral adenopathy or edema is  identified. No motor or sensory levels are noted. Crude visual fields are within normal range.  RADIOLOGY RESULTS: Mammograms reviewed compatible with above-stated findings  PLAN: Present time patient is doing well with no evidence of disease.  I am pleased with her overall progress.  She continues Femara without side effect.  I have asked to see her back in 1 year for follow-up.  Patient knows to call with any concerns.  I would like to take this opportunity to thank you for allowing me to participate in the care of your patient.Noreene Filbert, MD

## 2018-11-15 ENCOUNTER — Other Ambulatory Visit: Payer: Self-pay

## 2018-11-15 ENCOUNTER — Encounter (INDEPENDENT_AMBULATORY_CARE_PROVIDER_SITE_OTHER): Payer: 59 | Admitting: Ophthalmology

## 2018-11-15 DIAGNOSIS — H34812 Central retinal vein occlusion, left eye, with macular edema: Secondary | ICD-10-CM

## 2018-11-15 DIAGNOSIS — H43813 Vitreous degeneration, bilateral: Secondary | ICD-10-CM

## 2018-11-15 DIAGNOSIS — I10 Essential (primary) hypertension: Secondary | ICD-10-CM | POA: Diagnosis not present

## 2018-11-15 DIAGNOSIS — H35033 Hypertensive retinopathy, bilateral: Secondary | ICD-10-CM

## 2018-11-15 DIAGNOSIS — H348312 Tributary (branch) retinal vein occlusion, right eye, stable: Secondary | ICD-10-CM

## 2018-11-15 DIAGNOSIS — H338 Other retinal detachments: Secondary | ICD-10-CM

## 2018-11-28 ENCOUNTER — Ambulatory Visit: Payer: 59 | Admitting: Oncology

## 2018-12-07 NOTE — Progress Notes (Signed)
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 MammaPrint low risk.  INTERVAL HISTORY: Patient returns to clinic today for routine 53-monthevaluation.  She continues to tolerate letrozole well without significant side effects.  She currently feels well and is asymptomatic.  She denies any fevers, night sweats, or weight loss. She has no neurologic complaints. She has noted no new lymphadenopathy.  She denies any chest pain, shortness of breath, cough, or hemoptysis.  She denies any nausea, vomiting, constipation, or diarrhea. She has no urinary complaints.  Patient feels at her baseline offers no specific complaints today.  REVIEW OF SYSTEMS:   Review of Systems  Constitutional: Negative for chills, diaphoresis, fever, malaise/fatigue and weight loss.  Respiratory: Negative.  Negative for cough and shortness of breath.   Cardiovascular: Negative.  Negative for chest pain and leg swelling.  Gastrointestinal: Negative.  Negative for abdominal pain.  Genitourinary: Negative.  Negative for dysuria.  Musculoskeletal: Negative.  Negative for back pain.  Skin: Negative.  Negative for rash.  Neurological: Negative.  Negative for sensory change, focal weakness, weakness and headaches.  Psychiatric/Behavioral: Negative.  The patient is not nervous/anxious.     As per HPI. Otherwise, a complete review of systems is negative.  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 [facility] [employer]   . Arthritis t    knee  . Asthma    mild  . Breast cancer ([facility]   . 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 [place] . Depression   . Dyspnea   . Dysrhythmia   . GERD (gastroesophageal reflux disease)   . Hypertension   . Hypothyroidism   . MALT (mucosa associated lymphoid tissue) ([facility] 07/05/2014   Right neck mass resected 01/2014.  .Marland KitchenNo pertinent past medical history   . Personal history of radiation therapy   . Sleep apnea t   O2- 2l at bedtime and BiPap @ bedtime    PAST SURGICAL HISTORY: Past Surgical History:  Procedure Laterality Date  . BREAST BIOPSY Left 2/136/2018   INVASIVE MAMMARY CARCINOMA  . BREAST BIOPSY Right 05/25/2016   FIBROCYSTIC CHANGE WITH CALCIFICATIONS   . BREAST EXCISIONAL BIOPSY Left 06/04/2016   INVASIVE MAMMARY CARCINOMA.   .Marland KitchenBREAST LUMPECTOMY Left 06/04/2016   INVASIVE MAMMARY CARCINOMA.   .Marland KitchenCARDIAC CATHETERIZATION    . CARDIAC CATHETERIZATION N/A 09/24/2015   Procedure: Left Heart Cath and Coronary Angiography;  Surgeon: AIsaias Cowman MD;  [facility];  Service: Cardiovascular;  Laterality: N/A;  . CARDIAC CATHETERIZATION N/A 09/24/2015   Procedure: Coronary Stent Intervention;  Surgeon: AIsaias Cowman MD;  [facility];  Service: Cardiovascular;  Laterality: N/A;  . CHOLECYSTECTOMY  11/26   08/1970  . DILATATION & CURETTAGE/HYSTEROSCOPY WITH MYOSURE N/A 05/05/2015   Procedure: Hysteroscopy and endometrial curretage;  Surgeon: TBoykin Nearing MD;  Location: [facility];  Service: Gynecology;  Laterality: N/A;  . DILATION AND CURETTAGE OF UTERUS    .  EXCISION MASS FROM NECK  Right    Dr. Tami Ribas  . EYE SURGERY  t   bil cataract 9/08  . HAMMER TOE SURGERY Right   . JOINT REPLACEMENT Bilateral 2008, 11/26/ 2012   Partial Knee Replacements  . LEFT HEART CATH AND CORONARY ANGIOGRAPHY N/A 10/25/2017   Procedure: LEFT HEART CATH AND CORONARY ANGIOGRAPHY;   Surgeon: Teodoro Spray, MD;  Location: [facility];  Service: Cardiovascular;  Laterality: N/A;  . LEFT HEART CATH AND CORONARY ANGIOGRAPHY N/A 10/26/2017   Procedure: LEFT HEART CATH AND CORONARY ANGIOGRAPHY;  Surgeon: Isaias Cowman, MD;  Location: [facility];  Service: Cardiovascular;  Laterality: N/A;  . PARTIAL MASTECTOMY WITH NEEDLE LOCALIZATION Left 06/04/2016   Procedure: PARTIAL MASTECTOMY WITH NEEDLE LOCALIZATION;  Surgeon: Leonie Green, MD;  Location: [facility];  Service: General;  Laterality: Left;  . SCLERAL BUCKLE  02/16/2011   Procedure: SCLERAL BUCKLE;  Surgeon: Hayden Pedro, MD;  Location: [facility];  Service: Ophthalmology;  Laterality: Left;  Scleral Buckle Left Eye with Headscope Laser  . SENTINEL NODE BIOPSY Left 06/04/2016   Procedure: SENTINEL NODE BIOPSY;  Surgeon: Leonie Green, MD;  Location: [facility];  Service: General;  Laterality: Left;    FAMILY HISTORY Family History  Problem Relation Age of Onset  . Breast cancer Mother 31  . Heart disease Father   . Breast cancer Paternal Aunt 73  . Breast cancer Cousin   . Anesthesia problems Neg Hx   . Hypotension Neg Hx   . Malignant hyperthermia Neg Hx   . Pseudochol deficiency Neg Hx     GYNECOLOGIC HISTORY:  No LMP recorded. Patient is postmenopausal.     ADVANCED DIRECTIVES:    HEALTH MAINTENANCE: Social History   Tobacco Use  . Smoking status: Never Smoker  . Smokeless tobacco: Never Used  Substance Use Topics  . Alcohol use: No  . Drug use: No     Colonoscopy:  PAP:  Bone density:  Mammogram: November 2016  Allergies  Allergen Reactions  . Codeine Other (See Comments)    Stroke like symptoms    Current Outpatient Medications  Medication Sig Dispense Refill  . Acetaminophen (MAPAP) 500 MG coapsule Take by mouth.    Marland Kitchen apixaban (ELIQUIS) 5 MG TABS tablet Take 5 mg by mouth 2 (two) times daily.    Marland Kitchen aspirin 81 MG chewable tablet Chew 81 mg by mouth daily.     Marland Kitchen atorvastatin (LIPITOR) 40 MG tablet Take 1 tablet (40 mg total) by mouth daily at 6 PM. 30 tablet 0  . diltiazem (CARDIZEM CD) 300 MG 24 hr capsule Take 300 mg by mouth daily.    Marland Kitchen FERROUS SULFATE PO Take 325 mg by mouth once a week.    . Fluticasone-Salmeterol (ADVAIR) 100-50 MCG/DOSE AEPB Inhale 1 puff into the lungs 2 (two) times daily as needed (for asthma.).    Marland Kitchen isosorbide mononitrate (IMDUR) 30 MG 24 hr tablet Take 30 mg by mouth daily.    Marland Kitchen letrozole (FEMARA) 2.5 MG tablet TAKE 1 TABLET BY MOUTH ONCE DAILY 90 tablet 3  . levothyroxine (SYNTHROID, LEVOTHROID) 150 MCG tablet Take 150 mcg by mouth daily.      . Melatonin 5 MG TABS Take 5 mg by mouth at bedtime.    . metoprolol (TOPROL-XL) 100 MG 24 hr tablet Take 50 mg by mouth 2 (two) times daily.     . nitroGLYCERIN (NITROSTAT) 0.4 MG SL tablet Place 0.4 mg  under the tongue every 5 (five) minutes as needed for chest pain. Maximum 3 doses, If no relief call md or 911.    Marland Kitchen oxybutynin (DITROPAN) 5 MG tablet Take 5 mg by mouth daily.    . potassium chloride (K-DUR) 10 MEQ tablet Take 1 tablet (10 mEq total) by mouth daily. 30 tablet 5  . RABEprazole (ACIPHEX) 20 MG tablet Take 20 mg by mouth daily.      . sertraline (ZOLOFT) 50 MG tablet Take 50 mg by mouth at bedtime.      . sucralfate (CARAFATE) 1 g tablet Take 1 g by mouth 4 (four) times daily.    Marland Kitchen torsemide (DEMADEX) 20 MG tablet Take 10 mg by mouth every morning.     . diltiazem (CARDIZEM) 30 MG tablet Take 1 tablet (30 mg total) by mouth 2 (two) times daily as needed for up to 30 days (for breakthrough afib). 60 tablet 0   No current facility-administered medications for this visit.     OBJECTIVE: BP 123/66 (BP Location: Right Arm, Patient Position: Sitting)   Pulse 62   Temp 98.5 F (36.9 C) (Tympanic)   Wt 190 lb 6 oz (86.4 kg)   BMI 33.72 kg/m    Body mass index is 33.72 kg/m.    ECOG FS:0 - Asymptomatic  General: Well-developed, well-nourished, no acute distress.  Eyes: Pink conjunctiva, anicteric sclera. HEENT: Normocephalic, moist mucous membranes. Breasts: Patient declined breast exam today. Lungs: Clear to auscultation bilaterally. Heart: Regular rate and rhythm. No rubs, murmurs, or gallops. Abdomen: Soft, nontender, nondistended. No organomegaly noted, normoactive bowel sounds. Musculoskeletal: No edema, cyanosis, or clubbing. Neuro: Alert, answering all questions appropriately. Cranial nerves grossly intact. Skin: No rashes or petechiae noted. Psych: Normal affect. Lymphatics: No cervical, calvicular, axillary or inguinal LAD.  LAB RESULTS:   STUDIES: No results found.  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 MammaPrint low risk.  PLAN:    1. 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 MammaPrint was reported as low risk, therefore she did not require adjuvant chemotherapy. She completed adjuvant XRT. Continue letrozole for total 5 years completing in July 2023.  Patient's most recent mammogram on April 28, 2018 was reported as BI-RADS 2.  Repeat in February 2021.  Return to clinic in 6 months for routine evaluation.   2. 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  Patient's most recent CT scan on April 28, 2018 reviewed independently revealed no evidence of progressive or recurrent disease.  Patient is now nearly 5 years removed from her excision, therefore no further imaging is necessary unless there is suspicion of progression of disease. 3.  Left renal lesion: CT scan results from April 28, 2018 revealed a stable 9 x 8 mm cyst which is likely benign. 4.  Bone mineral density: Patient had a bone  marrow density completed on November 09, 2017 reported T score of 0.0.  This is considered normal.  Repeat in August 2021.  I spent a total of 20 minutes face-to-face with the patient of which greater than 50% of the visit was spent in counseling and coordination of care as detailed above.    Patient expressed understanding and was  in agreement with this plan. She also understands that She can call clinic at any time with any questions, concerns, or complaints.     Lloyd Huger, MD   12/12/2018 7:17 AM

## 2018-12-11 ENCOUNTER — Other Ambulatory Visit: Payer: Self-pay

## 2018-12-11 ENCOUNTER — Encounter: Payer: Self-pay | Admitting: Oncology

## 2018-12-11 ENCOUNTER — Inpatient Hospital Stay: Payer: 59 | Attending: Oncology | Admitting: Oncology

## 2018-12-11 VITALS — BP 123/66 | HR 62 | Temp 98.5°F | Wt 190.4 lb

## 2018-12-11 DIAGNOSIS — Z8572 Personal history of non-Hodgkin lymphomas: Secondary | ICD-10-CM | POA: Insufficient documentation

## 2018-12-11 DIAGNOSIS — Z923 Personal history of irradiation: Secondary | ICD-10-CM | POA: Diagnosis not present

## 2018-12-11 DIAGNOSIS — C50412 Malignant neoplasm of upper-outer quadrant of left female breast: Secondary | ICD-10-CM | POA: Insufficient documentation

## 2018-12-11 DIAGNOSIS — Z803 Family history of malignant neoplasm of breast: Secondary | ICD-10-CM | POA: Insufficient documentation

## 2018-12-11 DIAGNOSIS — N289 Disorder of kidney and ureter, unspecified: Secondary | ICD-10-CM | POA: Insufficient documentation

## 2018-12-11 DIAGNOSIS — C884 Extranodal marginal zone B-cell lymphoma of mucosa-associated lymphoid tissue [MALT-lymphoma]: Secondary | ICD-10-CM | POA: Diagnosis not present

## 2018-12-11 DIAGNOSIS — Z170 Estrogen receptor positive status [ER+]: Secondary | ICD-10-CM | POA: Diagnosis not present

## 2018-12-11 DIAGNOSIS — Z79811 Long term (current) use of aromatase inhibitors: Secondary | ICD-10-CM | POA: Insufficient documentation

## 2018-12-11 NOTE — Progress Notes (Signed)
Patient here today for follow up.  Patient denies any nausea, vomiting, diarrhea, constipation or pain. No new concerns with breast today

## 2018-12-27 ENCOUNTER — Encounter (INDEPENDENT_AMBULATORY_CARE_PROVIDER_SITE_OTHER): Payer: 59 | Admitting: Ophthalmology

## 2018-12-27 ENCOUNTER — Other Ambulatory Visit: Payer: Self-pay

## 2018-12-27 DIAGNOSIS — H43813 Vitreous degeneration, bilateral: Secondary | ICD-10-CM

## 2018-12-27 DIAGNOSIS — H35033 Hypertensive retinopathy, bilateral: Secondary | ICD-10-CM

## 2018-12-27 DIAGNOSIS — I10 Essential (primary) hypertension: Secondary | ICD-10-CM

## 2018-12-27 DIAGNOSIS — H34812 Central retinal vein occlusion, left eye, with macular edema: Secondary | ICD-10-CM | POA: Diagnosis not present

## 2018-12-27 DIAGNOSIS — H348312 Tributary (branch) retinal vein occlusion, right eye, stable: Secondary | ICD-10-CM

## 2019-01-30 ENCOUNTER — Other Ambulatory Visit: Payer: Self-pay

## 2019-01-30 ENCOUNTER — Encounter: Payer: Self-pay | Admitting: Nurse Practitioner

## 2019-01-30 ENCOUNTER — Inpatient Hospital Stay: Payer: 59 | Attending: Nurse Practitioner | Admitting: Nurse Practitioner

## 2019-01-30 ENCOUNTER — Telehealth: Payer: Self-pay | Admitting: *Deleted

## 2019-01-30 VITALS — BP 111/72 | HR 70 | Temp 98.3°F | Resp 16 | Wt 190.0 lb

## 2019-01-30 DIAGNOSIS — M25422 Effusion, left elbow: Secondary | ICD-10-CM | POA: Diagnosis present

## 2019-01-30 DIAGNOSIS — M25429 Effusion, unspecified elbow: Secondary | ICD-10-CM

## 2019-01-30 DIAGNOSIS — Z7982 Long term (current) use of aspirin: Secondary | ICD-10-CM | POA: Insufficient documentation

## 2019-01-30 DIAGNOSIS — Z8572 Personal history of non-Hodgkin lymphomas: Secondary | ICD-10-CM | POA: Diagnosis not present

## 2019-01-30 DIAGNOSIS — Z923 Personal history of irradiation: Secondary | ICD-10-CM | POA: Diagnosis not present

## 2019-01-30 DIAGNOSIS — Z803 Family history of malignant neoplasm of breast: Secondary | ICD-10-CM | POA: Insufficient documentation

## 2019-01-30 DIAGNOSIS — I10 Essential (primary) hypertension: Secondary | ICD-10-CM | POA: Diagnosis not present

## 2019-01-30 DIAGNOSIS — M25521 Pain in right elbow: Secondary | ICD-10-CM | POA: Diagnosis not present

## 2019-01-30 DIAGNOSIS — E039 Hypothyroidism, unspecified: Secondary | ICD-10-CM | POA: Insufficient documentation

## 2019-01-30 DIAGNOSIS — M25522 Pain in left elbow: Secondary | ICD-10-CM | POA: Diagnosis not present

## 2019-01-30 DIAGNOSIS — Z853 Personal history of malignant neoplasm of breast: Secondary | ICD-10-CM | POA: Insufficient documentation

## 2019-01-30 DIAGNOSIS — M25421 Effusion, right elbow: Secondary | ICD-10-CM | POA: Diagnosis present

## 2019-01-30 DIAGNOSIS — Z7901 Long term (current) use of anticoagulants: Secondary | ICD-10-CM | POA: Insufficient documentation

## 2019-01-30 NOTE — Telephone Encounter (Signed)
Patient called and reports that she has "knots" on her elbows where the Lymph nodes are". She is requesting to be seen

## 2019-01-30 NOTE — Progress Notes (Signed)
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 MammaPrint was reported as low risk, therefore she did not require adjuvant chemotherapy. She completed adjuvant XRT. Will complete 5 year of letrozole in July 2023.   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  Patient's most recent CT scan on April 28, 2018 revealed no evidence of progressive or recurrent disease.    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[place])  07/05/2014 Initial Diagnosis   MALT (mucosa associated lymphoid tissue)   Primary cancer of upper outer quadrant of left female breast ([place])  05/09/2016 Initial Diagnosis   Primary cancer of upper outer quadrant of left female breast ([place])     Interval history- Stacey Stone, 73 year old female with above history of breast cancer and non-Hodgkin's lymphoma, presents to symptom management clinic for complaints of right and left elbow pain and swelling on medial side.  She says symptoms started in the left arm around October 26.  She says initially she was having achiness in the left shoulder and elbow and upon her exam noticed swelling on the left inner elbow.  She has a history of left-sided breast cancer and received surgery and radiation.  She saw her primary care  provider who started her on steroids for possible tendinitis.  Symptoms did not improve and around October 31 she noticed swelling in the right elbow medial side identical to the left arm.  She describes mild pain.  Shoulder pain is since resolved.  No trauma, fevers, chills, unintended weight loss.  Denies other lumps or bumps.  Denies limited range of motion. She has been taking Tylenol which improves her pain. I reviewed Dr. Blane Ohara note from 01/19/2019.  On exam, left medial elbow with mild puffiness, no redness, mild warmth, full range of motion.  No cervical or supraclavicular nodes.  Elbow x-ray was performed but results not available.  ECOG FS:1 - Symptomatic but completely ambulatory  Review of systems- Review of Systems  Constitutional: Negative for chills, fever, malaise/fatigue and weight loss.  HENT: Negative for congestion and sore throat.   Respiratory: Negative for cough and shortness of breath.   Cardiovascular: Negative for chest pain.  Gastrointestinal: Negative for abdominal pain and nausea.  Musculoskeletal: Positive for joint pain. Negative for back pain, falls, myalgias and neck pain.  Skin: Negative for itching and rash.  Neurological: Negative for dizziness and weakness.  Endo/Heme/Allergies: Does not bruise/bleed easily.    Current treatment-letrozole  Allergies  Allergen Reactions   Codeine Other (See Comments)    Stroke like symptoms    Past Medical History:  Diagnosis Date   Anginal pain ([place])    Arthritis t   knee   Asthma    mild   Breast cancer ([place])    Coronary artery disease T   1 artery 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 Mamie Nick. at [facility] in [place]   Depression    Dyspnea    Dysrhythmia    GERD (gastroesophageal reflux disease)    Hypertension    Hypothyroidism    MALT (mucosa associated lymphoid tissue) ([place]) 07/05/2014   Right neck mass resected 01/2014.   No pertinent past medical history    Personal history of  radiation therapy    Sleep apnea t   O2- 2l at bedtime and BiPap @ bedtime    Past Surgical History:  Procedure Laterality Date   BREAST BIOPSY Left 2/136/2018   INVASIVE MAMMARY CARCINOMA   BREAST BIOPSY Right 05/25/2016   FIBROCYSTIC CHANGE WITH CALCIFICATIONS    BREAST EXCISIONAL BIOPSY Left 06/04/2016   INVASIVE MAMMARY CARCINOMA.    BREAST LUMPECTOMY Left 06/04/2016   INVASIVE MAMMARY CARCINOMA.    CARDIAC CATHETERIZATION     CARDIAC CATHETERIZATION N/A 09/24/2015   Procedure: Left Heart Cath and Coronary Angiography;  Surgeon: Isaias Cowman, MD;  Location: [name] [facility];  Service: Cardiovascular;  Laterality: N/A;   CARDIAC CATHETERIZATION N/A 09/24/2015   Procedure: Coronary Stent Intervention;  Surgeon: Isaias Cowman, MD;  Location: [place] [facility];  Service: Cardiovascular;  Laterality: N/A;   CHOLECYSTECTOMY  11/26   08/1970   DILATATION & CURETTAGE/HYSTEROSCOPY WITH MYOSURE N/A 05/05/2015   Procedure: Hysteroscopy and endometrial curretage;  Surgeon: Boykin Nearing, MD;  Location: [facility];  Service: Gynecology;  Laterality: N/A;   DILATION AND CURETTAGE OF UTERUS     EXCISION MASS FROM NECK  Right    Dr. Tami Ribas   EYE SURGERY  t   bil cataract 9/08   HAMMER TOE SURGERY Right    JOINT REPLACEMENT Bilateral 2008, 11/26/ 2012   Partial Knee Replacements   LEFT HEART CATH AND CORONARY ANGIOGRAPHY N/A 10/25/2017   Procedure: LEFT HEART CATH AND CORONARY ANGIOGRAPHY;  Surgeon: Teodoro Spray, MD;  Location: [place] [facility];  Service: Cardiovascular;  Laterality: N/A;   LEFT HEART CATH AND CORONARY ANGIOGRAPHY N/A 10/26/2017   Procedure: LEFT HEART CATH AND CORONARY ANGIOGRAPHY;  Surgeon: Isaias Cowman, MD;  Location: [place] [facility];  Service: Cardiovascular;  Laterality: N/A;   PARTIAL MASTECTOMY WITH NEEDLE LOCALIZATION Left 06/04/2016   Procedure: PARTIAL MASTECTOMY WITH NEEDLE LOCALIZATION;  Surgeon: Leonie Green, MD;  Location: [facility];  Service: General;  Laterality: Left;   SCLERAL BUCKLE  02/16/2011   Procedure: SCLERAL BUCKLE;  Surgeon: Hayden Pedro, MD;  Location: [place];  Service: Ophthalmology;  Laterality: Left;  Scleral Buckle Left Eye with Headscope Laser   SENTINEL NODE BIOPSY Left 06/04/2016   Procedure: SENTINEL NODE BIOPSY;  Surgeon: Leonie Green, MD;  Location: [facility];  Service: General;  Laterality: Left;    Social History   Socioeconomic History   Marital status: Widowed    Spouse name: Not on file   Number of children: Not on file   Years of education: Not on file   Highest education level: Not on file  Occupational History   Not on file  Social Needs   Financial resource strain: Not on file   Food insecurity    Worry: Not on file    Inability: Not on file   Transportation needs    Medical: Not on file    Non-medical: Not on file  Tobacco Use   Smoking status: Never Smoker   Smokeless tobacco: Never Used  Substance and Sexual Activity   Alcohol use: No   Drug use: No  Sexual activity: Not on file  Lifestyle   Physical activity    Days per week: Not on file    Minutes per session: Not on file   Stress: Not on file  Relationships   Social connections    Talks on phone: Not on file    Gets together: Not on file    Attends religious service: Not on file    Active member of club or organization: Not on file    Attends meetings of clubs or organizations: Not on file    Relationship status: Not on file   Intimate partner violence    Fear of current or ex partner: Not on file    Emotionally abused: Not on file    Physically abused: Not on file    Forced sexual activity: Not on file  Other Topics Concern   Not on file  Social History Narrative   Not on file    Family History  Problem Relation Age of Onset   Breast cancer Mother 56   Heart disease Father    Breast cancer Paternal Aunt 73   Breast cancer Cousin      Anesthesia problems Neg Hx    Hypotension Neg Hx    Malignant hyperthermia Neg Hx    Pseudochol deficiency Neg Hx      Current Outpatient Medications:    Acetaminophen (MAPAP) 500 MG coapsule, Take by mouth., Disp: , Rfl:    apixaban (ELIQUIS) 5 MG TABS tablet, Take 5 mg by mouth 2 (two) times daily., Disp: , Rfl:    aspirin 81 MG chewable tablet, Chew 81 mg by mouth daily., Disp: , Rfl:    atorvastatin (LIPITOR) 40 MG tablet, Take 1 tablet (40 mg total) by mouth daily at 6 PM., Disp: 30 tablet, Rfl: 0   diltiazem (CARDIZEM CD) 300 MG 24 hr capsule, Take 300 mg by mouth daily., Disp: , Rfl:    FERROUS SULFATE PO, Take 325 mg by mouth once a week., Disp: , Rfl:    Fluticasone-Salmeterol (ADVAIR) 100-50 MCG/DOSE AEPB, Inhale 1 puff into the lungs 2 (two) times daily as needed (for asthma.)., Disp: , Rfl:    isosorbide mononitrate (IMDUR) 30 MG 24 hr tablet, Take 30 mg by mouth daily., Disp: , Rfl:    letrozole (FEMARA) 2.5 MG tablet, TAKE 1 TABLET BY MOUTH ONCE DAILY, Disp: 90 tablet, Rfl: 3   levothyroxine (SYNTHROID, LEVOTHROID) 150 MCG tablet, Take 150 mcg by mouth daily.  , Disp: , Rfl:    Melatonin 5 MG TABS, Take 5 mg by mouth at bedtime., Disp: , Rfl:    metoprolol (TOPROL-XL) 100 MG 24 hr tablet, Take 50 mg by mouth 2 (two) times daily. , Disp: , Rfl:    nitroGLYCERIN (NITROSTAT) 0.4 MG SL tablet, Place 0.4 mg under the tongue every 5 (five) minutes as needed for chest pain. Maximum 3 doses, If no relief call md or 911., Disp: , Rfl:    oxybutynin (DITROPAN) 5 MG tablet, Take 5 mg by mouth daily., Disp: , Rfl:    potassium chloride (K-DUR) 10 MEQ tablet, Take 1 tablet (10 mEq total) by mouth daily., Disp: 30 tablet, Rfl: 5   RABEprazole (ACIPHEX) 20 MG tablet, Take 20 mg by mouth daily.  , Disp: , Rfl:    sertraline (ZOLOFT) 50 MG tablet, Take 50 mg by mouth at bedtime.  , Disp: , Rfl:    torsemide (DEMADEX) 20 MG tablet, Take 10 mg by mouth every morning. ,  Disp: , Rfl:    diltiazem (CARDIZEM) 30 MG tablet, Take 1 tablet (30 mg total) by mouth 2 (two) times daily as needed for up to 30 days (for breakthrough afib)., Disp: 60 tablet, Rfl: 0  Physical exam:  Vitals:   01/30/19 1454 01/30/19 1455  BP: 111/72   Pulse: 70   Resp: 16   Temp:  98.3 F (36.8 C)  TempSrc:  Tympanic  Weight: 190 lb (86.2 kg)    Physical Exam Constitutional:      General: She is not in acute distress.    Comments: Accompanied by her daughter. Wearing mask.   HENT:     Head: Normocephalic and atraumatic.  Eyes:     General: No scleral icterus.    Conjunctiva/sclera: Conjunctivae normal.  Neck:     Thyroid: No thyroid tenderness.  Pulmonary:     Effort: No respiratory distress.     Breath sounds: No wheezing.  Musculoskeletal:     Right elbow: She exhibits effusion. She exhibits normal range of motion. Tenderness found. Medial epicondyle tenderness noted. No radial head, no lateral epicondyle and no olecranon process tenderness noted.     Left elbow: She exhibits effusion. She exhibits normal range of motion. Tenderness found. Medial epicondyle tenderness noted. No radial head, no lateral epicondyle and no olecranon process tenderness noted.  Lymphadenopathy:     Head:     Right side of head: No submental, submandibular, tonsillar, preauricular, posterior auricular or occipital adenopathy.     Left side of head: No submental, submandibular, tonsillar, preauricular, posterior auricular or occipital adenopathy.     Cervical: No cervical adenopathy.     Right cervical: No superficial cervical adenopathy.    Left cervical: No superficial cervical adenopathy.     Upper Body:     Right upper body: No supraclavicular or axillary adenopathy.     Left upper body: No supraclavicular or axillary adenopathy.  Skin:    General: Skin is warm and dry.  Neurological:     Mental Status: She is alert and oriented to person, place, and time.  Psychiatric:        Mood and  Affect: Mood normal.        Behavior: Behavior normal.      CMP Latest Ref Rng & Units 06/07/2018  Glucose 70 - 99 mg/dL 128(H)  BUN 8 - 23 mg/dL 21  Creatinine 0.44 - 1.00 mg/dL 0.79  Sodium 135 - 145 mmol/L 140  Potassium 3.5 - 5.1 mmol/L 4.2  Chloride 98 - 111 mmol/L 108  CO2 22 - 32 mmol/L 26  Calcium 8.9 - 10.3 mg/dL 9.0  Total Protein 6.5 - 8.1 g/dL -  Total Bilirubin 0.3 - 1.2 mg/dL -  Alkaline Phos 38 - 126 U/L -  AST 15 - 41 U/L -  ALT 14 - 54 U/L -   CBC Latest Ref Rng & Units 06/07/2018  WBC 4.0 - 10.5 K/uL 10.2  Hemoglobin 12.0 - 15.0 g/dL 12.0  Hematocrit 36.0 - 46.0 % 39.0  Platelets 150 - 400 K/uL 238    No images are attached to the encounter.  No results found.  Assessment and plan- Patient is a 73 y.o. female who presents for acute visit for bilateral medial elbow pain and swelling.  Significant for left-sided breast cancer status post surgery and lymph node dissection and history of MALT lymphoma.  Etiology unclear.  Question medial epicondylitis though not significantly symptomatic and given her history of lymphoma concern for lymphadenopathy.  Symptoms refractory to prednisone.  Findings discussed with Dr. Grayland Ormond who recommends ultrasound to evaluate further.   Disposition: Follow-up based on results of ultrasound.  Notify clinic if symptoms do not improve or worsen.    Visit Diagnosis 1. Swelling of elbow joint, unspecified laterality     Patient expressed understanding and was in agreement with this plan. She also understands that She can call clinic at any time with any questions, concerns, or complaints.   Thank you for allowing me to participate in the care of this very pleasant patient.   Beckey Rutter, DNP, AGNP-[facility] [place]:  Dr. Ola Spurr

## 2019-01-30 NOTE — Telephone Encounter (Signed)
[facility] please 

## 2019-01-31 ENCOUNTER — Other Ambulatory Visit: Payer: Self-pay | Admitting: Nurse Practitioner

## 2019-01-31 DIAGNOSIS — M25422 Effusion, left elbow: Secondary | ICD-10-CM

## 2019-01-31 DIAGNOSIS — M25421 Effusion, right elbow: Secondary | ICD-10-CM

## 2019-02-01 ENCOUNTER — Ambulatory Visit
Admission: RE | Admit: 2019-02-01 | Discharge: 2019-02-01 | Disposition: A | Discharge status: Home / Self Care | Payer: 59 | Source: Ambulatory Visit | Attending: Nurse Practitioner | Admitting: Nurse Practitioner

## 2019-02-01 ENCOUNTER — Other Ambulatory Visit: Payer: Self-pay

## 2019-02-01 DIAGNOSIS — M25422 Effusion, left elbow: Secondary | ICD-10-CM | POA: Diagnosis present

## 2019-02-01 DIAGNOSIS — M25421 Effusion, right elbow: Secondary | ICD-10-CM

## 2019-02-05 ENCOUNTER — Telehealth: Payer: Self-pay | Admitting: Nurse Practitioner

## 2019-02-05 ENCOUNTER — Other Ambulatory Visit: Payer: Self-pay | Admitting: Nurse Practitioner

## 2019-02-05 DIAGNOSIS — M7701 Medial epicondylitis, right elbow: Secondary | ICD-10-CM

## 2019-02-05 NOTE — Telephone Encounter (Signed)
Called patient to discuss results of ultrasound which didn't show any lymphadenopathy. Suspect symptoms secondary to medial epicondylitis. Discussed this with patient who says swelling persists but minimal pain and no diminished elbow mobility. We discussed activity modification, otc compression sleeves, ice and oral analgesics. I will also refer to ortho for further evaluation and management. Dr. Grayland Ormond also updated and agrees with plan of care.

## 2019-02-07 ENCOUNTER — Encounter (INDEPENDENT_AMBULATORY_CARE_PROVIDER_SITE_OTHER): Payer: 59 | Admitting: Ophthalmology

## 2019-02-07 DIAGNOSIS — H43813 Vitreous degeneration, bilateral: Secondary | ICD-10-CM

## 2019-02-07 DIAGNOSIS — H34812 Central retinal vein occlusion, left eye, with macular edema: Secondary | ICD-10-CM | POA: Diagnosis not present

## 2019-02-07 DIAGNOSIS — I10 Essential (primary) hypertension: Secondary | ICD-10-CM

## 2019-02-07 DIAGNOSIS — H348312 Tributary (branch) retinal vein occlusion, right eye, stable: Secondary | ICD-10-CM | POA: Diagnosis not present

## 2019-02-07 DIAGNOSIS — H35033 Hypertensive retinopathy, bilateral: Secondary | ICD-10-CM

## 2019-02-07 DIAGNOSIS — H338 Other retinal detachments: Secondary | ICD-10-CM

## 2019-04-04 ENCOUNTER — Encounter (INDEPENDENT_AMBULATORY_CARE_PROVIDER_SITE_OTHER): Payer: 59 | Admitting: Ophthalmology

## 2019-04-04 DIAGNOSIS — H35033 Hypertensive retinopathy, bilateral: Secondary | ICD-10-CM | POA: Diagnosis not present

## 2019-04-04 DIAGNOSIS — H34831 Tributary (branch) retinal vein occlusion, right eye, with macular edema: Secondary | ICD-10-CM

## 2019-04-04 DIAGNOSIS — I10 Essential (primary) hypertension: Secondary | ICD-10-CM | POA: Diagnosis not present

## 2019-04-04 DIAGNOSIS — H43813 Vitreous degeneration, bilateral: Secondary | ICD-10-CM

## 2019-04-04 DIAGNOSIS — H338 Other retinal detachments: Secondary | ICD-10-CM

## 2019-04-04 DIAGNOSIS — H34812 Central retinal vein occlusion, left eye, with macular edema: Secondary | ICD-10-CM | POA: Diagnosis not present

## 2019-05-01 ENCOUNTER — Ambulatory Visit
Admission: RE | Admit: 2019-05-01 | Discharge: 2019-05-01 | Disposition: A | Discharge status: Home / Self Care | Payer: 59 | Source: Ambulatory Visit | Attending: Oncology | Admitting: Oncology

## 2019-05-01 DIAGNOSIS — C50412 Malignant neoplasm of upper-outer quadrant of left female breast: Secondary | ICD-10-CM | POA: Diagnosis not present

## 2019-05-24 ENCOUNTER — Other Ambulatory Visit: Payer: Self-pay

## 2019-05-24 ENCOUNTER — Emergency Department: Payer: 59

## 2019-05-24 ENCOUNTER — Inpatient Hospital Stay
Admission: EM | Admit: 2019-05-24 | Discharge: 2019-05-29 | DRG: 872 | Disposition: A | Discharge status: Home Health | Payer: 59 | Attending: Hospitalist | Admitting: Hospitalist

## 2019-05-24 DIAGNOSIS — I10 Essential (primary) hypertension: Secondary | ICD-10-CM | POA: Diagnosis present

## 2019-05-24 DIAGNOSIS — G4733 Obstructive sleep apnea (adult) (pediatric): Secondary | ICD-10-CM | POA: Diagnosis present

## 2019-05-24 DIAGNOSIS — Z96653 Presence of artificial knee joint, bilateral: Secondary | ICD-10-CM | POA: Diagnosis present

## 2019-05-24 DIAGNOSIS — A419 Sepsis, unspecified organism: Secondary | ICD-10-CM

## 2019-05-24 DIAGNOSIS — Z803 Family history of malignant neoplasm of breast: Secondary | ICD-10-CM

## 2019-05-24 DIAGNOSIS — C50212 Malignant neoplasm of upper-inner quadrant of left female breast: Secondary | ICD-10-CM | POA: Diagnosis present

## 2019-05-24 DIAGNOSIS — Z9012 Acquired absence of left breast and nipple: Secondary | ICD-10-CM

## 2019-05-24 DIAGNOSIS — K219 Gastro-esophageal reflux disease without esophagitis: Secondary | ICD-10-CM | POA: Diagnosis present

## 2019-05-24 DIAGNOSIS — Z7901 Long term (current) use of anticoagulants: Secondary | ICD-10-CM | POA: Diagnosis not present

## 2019-05-24 DIAGNOSIS — Z79811 Long term (current) use of aromatase inhibitors: Secondary | ICD-10-CM | POA: Diagnosis not present

## 2019-05-24 DIAGNOSIS — E876 Hypokalemia: Secondary | ICD-10-CM | POA: Diagnosis present

## 2019-05-24 DIAGNOSIS — E039 Hypothyroidism, unspecified: Secondary | ICD-10-CM | POA: Diagnosis present

## 2019-05-24 DIAGNOSIS — E7800 Pure hypercholesterolemia, unspecified: Secondary | ICD-10-CM | POA: Diagnosis present

## 2019-05-24 DIAGNOSIS — Z20822 Contact with and (suspected) exposure to covid-19: Secondary | ICD-10-CM | POA: Diagnosis present

## 2019-05-24 DIAGNOSIS — Z79890 Hormone replacement therapy (postmenopausal): Secondary | ICD-10-CM

## 2019-05-24 DIAGNOSIS — Z8249 Family history of ischemic heart disease and other diseases of the circulatory system: Secondary | ICD-10-CM | POA: Diagnosis not present

## 2019-05-24 DIAGNOSIS — A4150 Gram-negative sepsis, unspecified: Principal | ICD-10-CM | POA: Diagnosis present

## 2019-05-24 DIAGNOSIS — Z9049 Acquired absence of other specified parts of digestive tract: Secondary | ICD-10-CM | POA: Diagnosis not present

## 2019-05-24 DIAGNOSIS — Z9841 Cataract extraction status, right eye: Secondary | ICD-10-CM

## 2019-05-24 DIAGNOSIS — M79604 Pain in right leg: Secondary | ICD-10-CM | POA: Diagnosis not present

## 2019-05-24 DIAGNOSIS — R600 Localized edema: Secondary | ICD-10-CM

## 2019-05-24 DIAGNOSIS — Z955 Presence of coronary angioplasty implant and graft: Secondary | ICD-10-CM | POA: Diagnosis not present

## 2019-05-24 DIAGNOSIS — R509 Fever, unspecified: Secondary | ICD-10-CM

## 2019-05-24 DIAGNOSIS — I2510 Atherosclerotic heart disease of native coronary artery without angina pectoris: Secondary | ICD-10-CM | POA: Diagnosis present

## 2019-05-24 DIAGNOSIS — L03115 Cellulitis of right lower limb: Secondary | ICD-10-CM | POA: Diagnosis present

## 2019-05-24 DIAGNOSIS — Z9861 Coronary angioplasty status: Secondary | ICD-10-CM

## 2019-05-24 DIAGNOSIS — Z923 Personal history of irradiation: Secondary | ICD-10-CM | POA: Diagnosis not present

## 2019-05-24 DIAGNOSIS — Z7951 Long term (current) use of inhaled steroids: Secondary | ICD-10-CM

## 2019-05-24 DIAGNOSIS — I480 Paroxysmal atrial fibrillation: Secondary | ICD-10-CM | POA: Diagnosis present

## 2019-05-24 DIAGNOSIS — Z9842 Cataract extraction status, left eye: Secondary | ICD-10-CM

## 2019-05-24 DIAGNOSIS — Z79899 Other long term (current) drug therapy: Secondary | ICD-10-CM

## 2019-05-24 DIAGNOSIS — L0390 Cellulitis, unspecified: Secondary | ICD-10-CM

## 2019-05-24 DIAGNOSIS — Z7982 Long term (current) use of aspirin: Secondary | ICD-10-CM

## 2019-05-24 DIAGNOSIS — J45909 Unspecified asthma, uncomplicated: Secondary | ICD-10-CM | POA: Diagnosis present

## 2019-05-24 DIAGNOSIS — Z885 Allergy status to narcotic agent status: Secondary | ICD-10-CM

## 2019-05-24 LAB — LACTIC ACID, PLASMA
Lactic Acid, Venous: 2.9 mmol/L (ref 0.5–1.9)
Lactic Acid, Venous: 2.5 mmol/L (ref 0.5–1.9)

## 2019-05-24 LAB — URINALYSIS, COMPLETE (UACMP) WITH MICROSCOPIC
Bacteria, UA: NONE SEEN
Bilirubin Urine: NEGATIVE
Glucose, UA: NEGATIVE mg/dL
Ketones, ur: NEGATIVE mg/dL
Leukocytes,Ua: NEGATIVE
Nitrite: NEGATIVE
Protein, ur: NEGATIVE mg/dL
Specific Gravity, Urine: 1.01 (ref 1.005–1.030)
Squamous Epithelial / HPF: NONE SEEN (ref 0–5)
pH: 8 (ref 5.0–8.0)

## 2019-05-24 LAB — COMPREHENSIVE METABOLIC PANEL
ALT: 36 U/L (ref 0–44)
AST: 58 U/L — ABNORMAL HIGH (ref 15–41)
Albumin: 4 g/dL (ref 3.5–5.0)
Alkaline Phosphatase: 233 U/L — ABNORMAL HIGH (ref 38–126)
Anion gap: 12 (ref 5–15)
BUN: 12 mg/dL (ref 8–23)
CO2: 24 mmol/L (ref 22–32)
Calcium: 9.7 mg/dL (ref 8.9–10.3)
Chloride: 102 mmol/L (ref 98–111)
Creatinine, Ser: 0.95 mg/dL (ref 0.44–1.00)
GFR calc Af Amer: >60 mL/min (ref >60)
GFR calc non Af Amer: 59 mL/min — ABNORMAL LOW (ref >60)
Glucose, Bld: 127 mg/dL — ABNORMAL HIGH (ref 70–99)
Potassium: 3 mmol/L — ABNORMAL LOW (ref 3.5–5.1)
Sodium: 138 mmol/L (ref 135–145)
Total Bilirubin: 1.1 mg/dL (ref 0.3–1.2)
Total Protein: 7.7 g/dL (ref 6.5–8.1)

## 2019-05-24 LAB — CBC WITH DIFFERENTIAL/PLATELET
Abs Immature Granulocytes: 0.22 10*3/uL — ABNORMAL HIGH (ref 0.00–0.07)
Basophils Absolute: 0.1 10*3/uL (ref 0.0–0.1)
Basophils Relative: 0 %
Eosinophils Absolute: 0 10*3/uL (ref 0.0–0.5)
Eosinophils Relative: 0 %
HCT: 43.1 % (ref 36.0–46.0)
Hemoglobin: 13.9 g/dL (ref 12.0–15.0)
Immature Granulocytes: 1 %
Lymphocytes Relative: 4 %
Lymphs Abs: 0.8 10*3/uL (ref 0.7–4.0)
MCH: 26.5 pg (ref 26.0–34.0)
MCHC: 32.3 g/dL (ref 30.0–36.0)
MCV: 82.3 fL (ref 80.0–100.0)
Monocytes Absolute: 0.4 10*3/uL (ref 0.1–1.0)
Monocytes Relative: 2 %
Neutro Abs: 16.6 10*3/uL — ABNORMAL HIGH (ref 1.7–7.7)
Neutrophils Relative %: 93 %
Platelets: 232 10*3/uL (ref 150–400)
RBC: 5.24 MIL/uL — ABNORMAL HIGH (ref 3.87–5.11)
RDW: 14.6 % (ref 11.5–15.5)
WBC: 18.1 10*3/uL — ABNORMAL HIGH (ref 4.0–10.5)
nRBC: 0 % (ref 0.0–0.2)

## 2019-05-24 LAB — PROTIME-INR
INR: 1.1 (ref 0.8–1.2)
Prothrombin Time: 14.1 seconds (ref 11.4–15.2)

## 2019-05-24 LAB — APTT: aPTT: 29 seconds (ref 24–36)

## 2019-05-24 LAB — LIPASE, BLOOD: Lipase: 21 U/L (ref 11–51)

## 2019-05-24 MED ORDER — SODIUM CHLORIDE 0.9 % IV BOLUS (SEPSIS)
1000.0000 mL | Freq: Once | INTRAVENOUS | Status: AC
  Administered 2019-05-25: 1000 mL via INTRAVENOUS

## 2019-05-24 MED ORDER — ENOXAPARIN SODIUM 40 MG/0.4ML ~~LOC~~ SOLN: 40.0000 mg | SUBCUTANEOUS | Status: DC

## 2019-05-24 MED ORDER — SENNOSIDES-DOCUSATE SODIUM 8.6-50 MG PO TABS: 1.0000 | ORAL_TABLET | Freq: Every evening | ORAL | Status: DC | PRN

## 2019-05-24 MED ORDER — ACETAMINOPHEN 650 MG RE SUPP: 650.0000 mg | Freq: Four times a day (QID) | RECTAL | Status: DC | PRN

## 2019-05-24 MED ORDER — VANCOMYCIN HCL 2000 MG/400ML IV SOLN
2000.0000 mg | Freq: Once | INTRAVENOUS | Status: AC
  Administered 2019-05-24: 2000 mg via INTRAVENOUS
  Filled 2019-05-24: qty 400

## 2019-05-24 MED ORDER — ACETAMINOPHEN 325 MG PO TABS
650.0000 mg | ORAL_TABLET | Freq: Four times a day (QID) | ORAL | Status: DC | PRN
  Administered 2019-05-25 – 2019-05-27 (×3): 650 mg via ORAL
  Filled 2019-05-24 (×3): qty 2

## 2019-05-24 MED ORDER — METRONIDAZOLE IN NACL 5-0.79 MG/ML-% IV SOLN
500.0000 mg | Freq: Three times a day (TID) | INTRAVENOUS | Status: DC
  Administered 2019-05-25 – 2019-05-27 (×7): 500 mg via INTRAVENOUS
  Filled 2019-05-24 (×9): qty 100

## 2019-05-24 MED ORDER — ONDANSETRON HCL 4 MG/2ML IJ SOLN
4.0000 mg | Freq: Four times a day (QID) | INTRAMUSCULAR | Status: DC | PRN
  Administered 2019-05-26: 4 mg via INTRAVENOUS
  Filled 2019-05-24: qty 2

## 2019-05-24 MED ORDER — METRONIDAZOLE IN NACL 5-0.79 MG/ML-% IV SOLN
500.0000 mg | Freq: Once | INTRAVENOUS | Status: AC
  Administered 2019-05-24: 500 mg via INTRAVENOUS
  Filled 2019-05-24: qty 100

## 2019-05-24 MED ORDER — SODIUM CHLORIDE 0.9 % IV SOLN
2.0000 g | Freq: Three times a day (TID) | INTRAVENOUS | Status: DC
  Administered 2019-05-25 – 2019-05-27 (×6): 2 g via INTRAVENOUS
  Filled 2019-05-24 (×9): qty 2

## 2019-05-24 MED ORDER — SODIUM CHLORIDE 0.9 % IV SOLN: INTRAVENOUS | Status: DC

## 2019-05-24 MED ORDER — METOPROLOL SUCCINATE ER 50 MG PO TB24: 50.0000 mg | ORAL_TABLET | Freq: Two times a day (BID) | ORAL | Status: DC

## 2019-05-24 MED ORDER — ONDANSETRON HCL 4 MG/2ML IJ SOLN
4.0000 mg | Freq: Once | INTRAMUSCULAR | Status: AC
  Administered 2019-05-24: 4 mg via INTRAVENOUS
  Filled 2019-05-24: qty 2

## 2019-05-24 MED ORDER — SODIUM CHLORIDE 0.9 % IV SOLN
2.0000 g | Freq: Once | INTRAVENOUS | Status: AC
  Administered 2019-05-24: 2 g via INTRAVENOUS
  Filled 2019-05-24: qty 2

## 2019-05-24 MED ORDER — SODIUM CHLORIDE 0.9 % IV BOLUS (SEPSIS)
1000.0000 mL | Freq: Once | INTRAVENOUS | Status: AC
  Administered 2019-05-24: 1000 mL via INTRAVENOUS

## 2019-05-24 MED ORDER — VANCOMYCIN HCL IN DEXTROSE 1-5 GM/200ML-% IV SOLN: 1000.0000 mg | Freq: Once | INTRAVENOUS | Status: DC

## 2019-05-24 MED ORDER — APIXABAN 5 MG PO TABS
5.0000 mg | ORAL_TABLET | Freq: Two times a day (BID) | ORAL | Status: DC
  Administered 2019-05-25 – 2019-05-29 (×9): 5 mg via ORAL
  Filled 2019-05-24 (×9): qty 1

## 2019-05-24 MED ORDER — KETOROLAC TROMETHAMINE 15 MG/ML IJ SOLN
15.0000 mg | Freq: Four times a day (QID) | INTRAMUSCULAR | Status: DC | PRN
  Filled 2019-05-24: qty 1

## 2019-05-24 MED ORDER — LACTATED RINGERS IV BOLUS
1000.0000 mL | Freq: Once | INTRAVENOUS | Status: AC
  Administered 2019-05-24: 1000 mL via INTRAVENOUS

## 2019-05-24 MED ORDER — ACETAMINOPHEN 500 MG PO TABS
1000.0000 mg | ORAL_TABLET | Freq: Once | ORAL | Status: AC
  Administered 2019-05-24: 1000 mg via ORAL
  Filled 2019-05-24: qty 2

## 2019-05-24 MED ORDER — ONDANSETRON HCL 4 MG PO TABS: 4.0000 mg | ORAL_TABLET | Freq: Four times a day (QID) | ORAL | Status: DC | PRN

## 2019-05-24 NOTE — ED Notes (Signed)
Blood cultures first set collected by [facility], second set collected by [facility]

## 2019-05-24 NOTE — ED Triage Notes (Signed)
Pt from home, lives with son with Down Syndrome.  Abdominal pain started at 330 this afternoon, feels nauseated. Denies blood in urine. Pt also with c/o pain in right leg, right leg swollen, red, and hot. Pt states this started this afternoon.

## 2019-05-24 NOTE — ED Notes (Signed)
Date and time results received: 05/24/19 2124 (use smartphrase ".now" to insert current time)  Test: LActic Critical Value: 2.9  Name of Provider Notified: Charna Archer  Orders Received? Or Actions Taken?: Orders Received - See Orders for details

## 2019-05-24 NOTE — Progress Notes (Signed)
PHARMACY -  BRIEF ANTIBIOTIC NOTE   Pharmacy has received consult(s) for Cefepime, Vancomycin from an ED provider.  The patient's profile has been reviewed for ht/wt/allergies/indication/available labs.    One time order(s) placed for Cefepime 2 gm IV X 1 and Vancomycin 2 gm IV X 1   Further antibiotics/pharmacy consults should be ordered by admitting physician if indicated.                       Thank you, Saryn Cherry D 05/24/2019  8:51 PM

## 2019-05-24 NOTE — ED Notes (Signed)
X ray notified that pt is available for xray

## 2019-05-24 NOTE — H&P (Signed)
History and Physical    Stacey Stone [id number] DOB: [birth year] DOA: 05/24/2019  PCP: Rusty Aus, MD   Patient coming from: home I have personally briefly reviewed patient's old medical records in [place]  Chief Complaint: nausea, vomiting, fever, painful right leg  HPI: Stacey Stone is a 74 y.o. female with medical history significant for coronary artery disease with occluded RCA, paroxysmal A. fib status post ablation 2019 with recurrence currently on Eliquis, history of breast cancer on letrozole as well as history of chronic lower extremity edema, OSA on CPAP, hypothyroidism and hypertension who presents to the emergency room with a several hour history of malaise associated with nausea and dry heaving and at the same time noticed  pain and redness in her right lower extremity.  She also reported chills.  She had mild lower abdominal pain only after severe bouts of dry heaving.  She states she was scratched by her dog on her right lower leg just over a week ago  ED Course: On arrival in the emergency room she had a rectal temperature of 104.5, BP 150/77, HR 103, RR 14 with O2 sat 96% on room air.  On her blood work she had a white cell count of 18,100.  Hemoglobin 13.9.  Chemistries mostly abnormal except for mild hypokalemia with potassium of 3.0.  Lactic acid 2.9, lipase 21.  Urinalysis unremarkable.  Chest x-ray clear.  X-ray of tibia and fibula, no acute abnormality, no gas.  Patient was started on sepsis fluids and treatment for sepsis secondary to cellulitis.  Hospitalist consulted for admission.  Review of Systems: As per HPI otherwise 10 point review of systems negative.    Past Medical History:  Diagnosis Date  . Anginal pain ([place])   . Arthritis t   knee  . Asthma    mild  . Breast cancer ([place])   . 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 Mamie Nick. at [facility] in [place]  . Depression   . Dyspnea   .  Dysrhythmia   . GERD (gastroesophageal reflux disease)   . Hypertension   . Hypothyroidism   . 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  Marland Kitchen No pertinent past medical history   . Personal history of radiation therapy   . Sleep apnea t   O2- 2l at bedtime and BiPap @ bedtime    Past Surgical History:  Procedure Laterality Date  . BREAST BIOPSY Left 2/136/2018   INVASIVE MAMMARY CARCINOMA  . BREAST BIOPSY Right 05/25/2016   FIBROCYSTIC CHANGE WITH CALCIFICATIONS   . BREAST EXCISIONAL BIOPSY Left 06/04/2016   INVASIVE MAMMARY CARCINOMA.   Marland Kitchen BREAST LUMPECTOMY Left 06/04/2016   INVASIVE MAMMARY CARCINOMA.   Marland Kitchen CARDIAC CATHETERIZATION    . CARDIAC CATHETERIZATION N/A 09/24/2015   Procedure: Left Heart Cath and Coronary Angiography;  Surgeon: Isaias Cowman, MD;  Location: [facility];  Service: Cardiovascular;  Laterality: N/A;  . CARDIAC CATHETERIZATION N/A 09/24/2015   Procedure: Coronary Stent Intervention;  Surgeon: Isaias Cowman, MD;  Location: [facility];  Service: Cardiovascular;  Laterality: N/A;  . CHOLECYSTECTOMY  11/26   08/1970  . DILATATION & CURETTAGE/HYSTEROSCOPY WITH MYOSURE N/A 05/05/2015   Procedure: Hysteroscopy and endometrial curretage;  Surgeon: Boykin Nearing, MD;  Location: [facility];  Service: Gynecology;  Laterality: N/A;  . DILATION AND CURETTAGE OF UTERUS    . EXCISION MASS FROM NECK  Right    Dr. Tami Ribas  . EYE SURGERY  t   bil cataract 9/08  . HAMMER TOE SURGERY Right   . JOINT REPLACEMENT Bilateral 2008, 11/26/ 2012   Partial Knee Replacements  . LEFT HEART CATH AND CORONARY ANGIOGRAPHY N/A 10/25/2017   Procedure: LEFT HEART CATH AND CORONARY ANGIOGRAPHY;  Surgeon: Teodoro Spray, MD;  Location: [facility];  Service: Cardiovascular;  Laterality: N/A;  . LEFT HEART CATH AND CORONARY ANGIOGRAPHY N/A 10/26/2017   Procedure: LEFT HEART CATH AND CORONARY ANGIOGRAPHY;   Surgeon: Isaias Cowman, MD;  Location: [facility];  Service: Cardiovascular;  Laterality: N/A;  . PARTIAL MASTECTOMY WITH NEEDLE LOCALIZATION Left 06/04/2016   Procedure: PARTIAL MASTECTOMY WITH NEEDLE LOCALIZATION;  Surgeon: Leonie Green, MD;  Location: [facility];  Service: General;  Laterality: Left;  . SCLERAL BUCKLE  02/16/2011   Procedure: SCLERAL BUCKLE;  Surgeon: Hayden Pedro, MD;  Location: [facility];  Service: Ophthalmology;  Laterality: Left;  Scleral Buckle Left Eye with Headscope Laser  . SENTINEL NODE BIOPSY Left 06/04/2016   Procedure: SENTINEL NODE BIOPSY;  Surgeon: Leonie Green, MD;  Location: [facility];  Service: General;  Laterality: Left;     reports that she has never smoked. She has never used smokeless tobacco. She reports that she does not drink alcohol or use drugs.  Allergies  Allergen Reactions  . Codeine Other (See Comments)    Stroke like symptoms    Family History  Problem Relation Age of Onset  . Breast cancer Mother 57  . Heart disease Father   . Breast cancer Paternal Aunt 73  . Breast cancer Cousin   . Anesthesia problems Neg Hx   . Hypotension Neg Hx   . Malignant hyperthermia Neg Hx   . Pseudochol deficiency Neg Hx      Prior to Admission medications   Medication Sig Start Date End Date Taking? Authorizing Provider  Acetaminophen (MAPAP) 500 MG coapsule Take by mouth.    [provider]  apixaban (ELIQUIS) 5 MG TABS tablet Take 5 mg by mouth 2 (two) times daily.    [provider]  aspirin 81 MG chewable tablet Chew 81 mg by mouth daily.    [provider]  atorvastatin (LIPITOR) 40 MG tablet Take 1 tablet (40 mg total) by mouth daily at 6 PM. 10/27/17   Loletha Grayer, MD  diltiazem (CARDIZEM CD) 300 MG 24 hr capsule Take 300 mg by mouth daily.    [provider]  diltiazem (CARDIZEM) 30 MG tablet Take 1 tablet (30 mg total) by mouth 2 (two) times daily as needed for up to 30 days  (for breakthrough afib). 06/07/18 07/07/18  Ripley Fraise, PA  FERROUS SULFATE PO Take 325 mg by mouth once a week.    [provider]  Fluticasone-Salmeterol (ADVAIR) 100-50 MCG/DOSE AEPB Inhale 1 puff into the lungs 2 (two) times daily as needed (for asthma.).    [provider]  isosorbide mononitrate (IMDUR) 30 MG 24 hr tablet Take 30 mg by mouth daily.    [provider]  letrozole (FEMARA) 2.5 MG tablet TAKE 1 TABLET BY MOUTH ONCE DAILY 09/16/18   Lloyd Huger, MD  levothyroxine (SYNTHROID, LEVOTHROID) 150 MCG tablet Take 150 mcg by mouth daily.      [provider]  Melatonin 5 MG TABS Take 5 mg by mouth at bedtime.    [provider]  metoprolol (TOPROL-XL) 100 MG 24 hr  tablet Take 50 mg by mouth 2 (two) times daily.     [provider]  nitroGLYCERIN (NITROSTAT) 0.4 MG SL tablet Place 0.4 mg under the tongue every 5 (five) minutes as needed for chest pain. Maximum 3 doses, If no relief call md or 911.    [provider]  oxybutynin (DITROPAN) 5 MG tablet Take 5 mg by mouth daily.    [provider]  potassium chloride (K-DUR) 10 MEQ tablet Take 1 tablet (10 mEq total) by mouth daily. 09/25/15   Paraschos, Alexander, MD  RABEprazole (ACIPHEX) 20 MG tablet Take 20 mg by mouth daily.      [provider]  sertraline (ZOLOFT) 50 MG tablet Take 50 mg by mouth at bedtime.      [provider]  torsemide (DEMADEX) 20 MG tablet Take 10 mg by mouth every morning.     [provider]    Physical Exam: Vitals:   05/24/19 2118 05/24/19 2130 05/24/19 2131 05/24/19 2145  BP:   (!) 167/94   Pulse: 92 (!) 102  (!) 102  Resp: (!) 22 (!) 23 (!) 27 (!) 26  Temp:      TempSrc:      SpO2: 97% 94%  97%  Weight:      Height:         Vitals:   05/24/19 2118 05/24/19 2130 05/24/19 2131 05/24/19 2145  BP:   (!) 167/94   Pulse: 92 (!) 102  (!) 102  Resp: (!) 22 (!) 23 (!) 27 (!) 26  Temp:       TempSrc:      SpO2: 97% 94%  97%  Weight:      Height:        Constitutional: Alert and awake, oriented x3, not in any acute distress. Eyes: PERLA, EOMI, irises appear normal, anicteric sclera,  ENMT: external ears and nose appear normal, normal hearing             Lips appears normal, oropharynx mucosa, tongue, posterior pharynx appear normal  Neck: neck appears normal, no masses, normal ROM, no thyromegaly, no JVD  CVS: S1-S2 clear, no murmur rubs or gallops,  , no carotid bruits, pedal pulses palpable, bilateral lower extremity edema Respiratory:  clear to auscultation bilaterally, no wheezing, rales or rhonchi. Respiratory effort normal. No accessory muscle use.  Abdomen: soft nontender, nondistended, normal bowel sounds, no hepatosplenomegaly, no hernias Musculoskeletal: : no cyanosis, clubbing , no contractures or atrophy Neuro: Cranial nerves II-XII intact, sensation, reflexes normal, strength Psych: judgement and insight appear normal, stable mood and affect,  Skin: Circumferential redness starting about 7cm below the knee extending down to the dorsal foot.  Small scab about 0.5 cm diameter seen in upper anterior portion of right lower leg.   Labs on Admission: I have personally reviewed following labs and imaging studies  CBC: Recent Labs  Lab 05/24/19 2051  WBC 18.1*  NEUTROABS 16.6*  HGB 13.9  HCT 43.1  MCV 82.3  PLT [id number]   Basic Metabolic Panel: Recent Labs  Lab 05/24/19 2051  NA 138  K 3.0*  CL 102  CO2 24  GLUCOSE 127*  BUN 12  CREATININE 0.95  CALCIUM 9.7   GFR: Estimated Creatinine Clearance: 52.8 mL/min (by C-G formula based on SCr of 0.95 mg/dL). Liver Function Tests: Recent Labs  Lab 05/24/19 2051  AST 58*  ALT 36  ALKPHOS 233*  BILITOT 1.1  PROT 7.7  ALBUMIN 4.0   Recent Labs  Lab 05/24/19 2051  LIPASE 21   No results for input(s): AMMONIA in the last 168 hours. Coagulation Profile: Recent Labs  Lab 05/24/19 2051  INR 1.1    Cardiac Enzymes: No results for input(s): CKTOTAL, CKMB, CKMBINDEX, TROPONINI in the last 168 hours. BNP (last 3 results) No results for input(s): PROBNP in the last 8760 hours. HbA1C: No results for input(s): HGBA1C in the last 72 hours. CBG: No results for input(s): GLUCAP in the last 168 hours. Lipid Profile: No results for input(s): CHOL, HDL, LDLCALC, TRIG, CHOLHDL, LDLDIRECT in the last 72 hours. Thyroid Function Tests: No results for input(s): TSH, T4TOTAL, FREET4, T3FREE, THYROIDAB in the last 72 hours. Anemia Panel: No results for input(s): VITAMINB12, FOLATE, FERRITIN, TIBC, IRON, RETICCTPCT in the last 72 hours. Urine analysis:    Component Value Date/Time   COLORURINE YELLOW (A) 05/24/2019 2051   APPEARANCEUR CLEAR (A) 05/24/2019 2051   LABSPEC 1.010 05/24/2019 2051   PHURINE 8.0 05/24/2019 2051   GLUCOSEU NEGATIVE 05/24/2019 2051   HGBUR SMALL (A) 05/24/2019 2051   [place] NEGATIVE 05/24/2019 2051   [place] NEGATIVE 05/24/2019 2051   PROTEINUR NEGATIVE 05/24/2019 2051   NITRITE NEGATIVE 05/24/2019 2051   LEUKOCYTESUR NEGATIVE 05/24/2019 2051    Radiological Exams on Admission: DG Chest 1 View  Result Date: 05/24/2019 CLINICAL DATA:  [place] EXAM: CHEST  1 VIEW COMPARISON:  06/06/2018 FINDINGS: Cardiac shadow is mildly enlarged but stable. Tortuous thoracic aorta is seen. The lungs are well aerated bilaterally. No bony abnormality is seen. IMPRESSION: No acute abnormality noted. Electronically Signed   By: Inez Catalina M.D.   On: 05/24/2019 21:46   DG Tibia/Fibula Right  Result Date: 05/24/2019 CLINICAL DATA:  Right leg pain and swelling, no known injury, initial encounter EXAM: RIGHT TIBIA AND FIBULA - 2 VIEW COMPARISON:  04/24/2017 FINDINGS: Partial medial joint replacement is noted similar to that seen on prior plain film examination. No acute fracture or dislocation is seen. No gross soft tissue abnormality is noted. IMPRESSION: No acute abnormality seen.  Electronically Signed   By: Inez Catalina M.D.   On: 05/24/2019 21:47    EKG: Independently reviewed.   Assessment/Plan Principal Problem:   Sepsis ([place])   Cellulitis of right lower extremity   Bilateral edema of lower extremity -Patient with history of chronic lower extremity edema presenting with pain redness and swelling right lower extremity and meeting sepsis criteria with fever, tachycardia, leukocytosis and lactic acid 2.9 -Continue cefepime, metronidazole and vancomycin -Complete IV fluid bolus then continue with normal saline -Follow blood cultures -Patient is on torsemide for chronic lower extremity edema    CAD S/P percutaneous coronary angioplasty -No complaints of chest pain -Continue aspirin, Imdur, atorvastatin    Essential hypertension -Blood pressure controlled -Continue diltiazem    Hypothyroidism -Continue levothyroxine    OSA on CPAP -CPAP as desired by patient    Paroxysmal atrial fibrillation ([place]) -History of ablation 2019 with recurrence -Eliquis to continue -Continue diltiazem    DVT prophylaxis: Eliquis from home Code Status: full code  Family Communication:  none  Disposition Plan: Back to previous home environment Consults called: none  Status:inp    Athena Masse MD [facility]     05/24/2019, 10:03 PM

## 2019-05-24 NOTE — ED Provider Notes (Signed)
[facility] Emergency Department Provider Note   ____________________________________________   First MD Initiated Contact with Patient 05/24/19 2005     (approximate)  I have reviewed the triage vital signs and the nursing notes.   HISTORY  Chief Complaint Abdominal Pain and Leg Pain    HPI Stacey Stone is a 74 y.o. female with past medical history of CAD, hypertension, and A. fib on anticoagulation presents to the ED complaining of leg pain.  Patient reports that over the course of this afternoon she has increasingly "felt bad".  She is also begun to notice redness extending over her right lower leg with some swelling and pain.  She states she has dealt with redness and swelling in this leg before, but denies any history of DVT.  She did not take her temperature at home, but states she has felt hot with nausea and a couple episodes of vomiting.  She had onset of some abdominal discomfort after vomiting, but denies significant pain at this time.  She denies any dysuria, hematuria, cough, chest pain, or shortness of breath.        Past Medical History:  Diagnosis Date  . Anginal pain ([place])   . Arthritis t   knee  . Asthma    mild  . Breast cancer ([place])   . 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 Mamie Nick. at [facility] in [place]  . Depression   . Dyspnea   . Dysrhythmia   . GERD (gastroesophageal reflux disease)   . Hypertension   . Hypothyroidism   . 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  Marland Kitchen No pertinent past medical history   . Personal history of radiation therapy   . Sleep apnea t   O2- 2l at bedtime and BiPap @ bedtime    Patient Active Problem List   Diagnosis Date Noted  . Sepsis ([place]) 05/24/2019  . Cellulitis of right lower extremity 05/24/2019  . Bilateral edema of lower extremity 05/24/2019  . PMB (postmenopausal bleeding) 06/17/2016    . Preoperative cardiovascular examination 05/13/2016  . Primary cancer of upper outer quadrant of left female breast ([place]) 05/09/2016  . Unstable angina ([place]) 09/24/2015  . S/P cardiac catheterization 09/24/2015  . History of total right knee replacement 06/25/2015  . B12 deficiency 01/15/2015  . Vitamin D deficiency 01/15/2015  . Paroxysmal atrial fibrillation ([place]) 12/15/2014  . Aortic valve disorder 11/06/2014  . 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 OSA on CPAP 01/02/2014  . Angina at rest [facility]) 06/16/2013  . Essential hypertension 06/16/2013  . Hypercholesterolemia 06/16/2013  . Hypothyroidism 06/16/2013  . 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 CAD S/P percutaneous coronary angioplasty 01/08/2006    Past Surgical History:  Procedure Laterality Date  . BREAST BIOPSY Left 2/136/2018   INVASIVE MAMMARY CARCINOMA  . BREAST BIOPSY Right 05/25/2016   FIBROCYSTIC CHANGE WITH CALCIFICATIONS   . BREAST EXCISIONAL BIOPSY Left 06/04/2016   INVASIVE MAMMARY CARCINOMA.   Marland Kitchen BREAST LUMPECTOMY Left 06/04/2016   INVASIVE MAMMARY CARCINOMA.   Marland Kitchen CARDIAC CATHETERIZATION    . CARDIAC CATHETERIZATION N/A 09/24/2015   Procedure: Left Heart Cath and Coronary Angiography;  Surgeon: Isaias Cowman, MD;  Location: [place] CV [facility];  Service: Cardiovascular;  Laterality: N/A;  . CARDIAC CATHETERIZATION N/A 09/24/2015   Procedure: Coronary Stent Intervention;  Surgeon: Isaias Cowman, MD;  Location:  [place] [facility];  Service: Cardiovascular;  Laterality: N/A;  . CHOLECYSTECTOMY  11/26   08/1970  . DILATATION & CURETTAGE/HYSTEROSCOPY WITH MYOSURE N/A 05/05/2015   Procedure: Hysteroscopy and endometrial curretage;  Surgeon: Boykin Nearing, MD;  Location: [facility] ORS;  Service: Gynecology;  Laterality: N/A;  . DILATION AND CURETTAGE OF UTERUS    . EXCISION MASS FROM NECK  Right    Dr. Tami Ribas  . EYE SURGERY  t   bil  cataract 9/08  . HAMMER TOE SURGERY Right   . JOINT REPLACEMENT Bilateral 2008, 11/26/ 2012   Partial Knee Replacements  . LEFT HEART CATH AND CORONARY ANGIOGRAPHY N/A 10/25/2017   Procedure: LEFT HEART CATH AND CORONARY ANGIOGRAPHY;  Surgeon: Teodoro Spray, MD;  Location: [place] [facility];  Service: Cardiovascular;  Laterality: N/A;  . LEFT HEART CATH AND CORONARY ANGIOGRAPHY N/A 10/26/2017   Procedure: LEFT HEART CATH AND CORONARY ANGIOGRAPHY;  Surgeon: Isaias Cowman, MD;  Location: [place] [facility];  Service: Cardiovascular;  Laterality: N/A;  . PARTIAL MASTECTOMY WITH NEEDLE LOCALIZATION Left 06/04/2016   Procedure: PARTIAL MASTECTOMY WITH NEEDLE LOCALIZATION;  Surgeon: Leonie Green, MD;  Location: [facility] ORS;  Service: General;  Laterality: Left;  . SCLERAL BUCKLE  02/16/2011   Procedure: SCLERAL BUCKLE;  Surgeon: Hayden Pedro, MD;  Location: [place];  Service: Ophthalmology;  Laterality: Left;  Scleral Buckle Left Eye with Headscope Laser  . SENTINEL NODE BIOPSY Left 06/04/2016   Procedure: SENTINEL NODE BIOPSY;  Surgeon: Leonie Green, MD;  Location: [facility] ORS;  Service: General;  Laterality: Left;    Prior to Admission medications   Medication Sig Start Date End Date Taking? Authorizing Provider  acidophilus (RISAQUAD) CAPS capsule Take 1 capsule by mouth daily.   Yes [provider]  apixaban (ELIQUIS) 5 MG TABS tablet Take 5 mg by mouth 2 (two) times daily.   Yes [provider]  aspirin 81 MG EC tablet Take 81 mg by mouth daily.    Yes [provider]  atorvastatin (LIPITOR) 40 MG tablet Take 1 tablet (40 mg total) by mouth daily at 6 PM. Patient taking differently: Take 40 mg by mouth at bedtime.  10/27/17  Yes Wieting, Richard, MD  diltiazem (CARDIZEM CD) 300 MG 24 hr capsule Take 300 mg by mouth daily.   Yes [provider]  diltiazem (CARDIZEM) 30 MG tablet Take 30 mg by mouth 2 (two) times daily as needed (for  breakthrough afib).    Yes [provider]  Fluticasone-Salmeterol (ADVAIR) 100-50 MCG/DOSE AEPB Inhale 1 puff into the lungs 2 (two) times daily as needed (for asthma.).   Yes [provider]  isosorbide mononitrate (IMDUR) 30 MG 24 hr tablet Take 30 mg by mouth daily.   Yes [provider]  letrozole ([place]) 2.5 MG tablet TAKE 1 TABLET BY MOUTH ONCE DAILY Patient taking differently: Take 2.5 mg by mouth daily.  09/16/18  Yes Lloyd Huger, MD  levothyroxine (SYNTHROID) 125 MCG tablet Take 125 mcg by mouth daily.    Yes [provider]  metoprolol (TOPROL-XL) 100 MG 24 hr tablet Take 50 mg by mouth 2 (two) times daily.    Yes [provider]  nitroGLYCERIN (NITROSTAT) 0.4 MG SL tablet Place 0.4 mg under the tongue every 5 (five) minutes as needed for chest pain. Maximum 3 doses, If no relief call md or 911.   Yes [provider]  oxybutynin (DITROPAN) 5 MG tablet Take 5  mg by mouth at bedtime.    Yes [provider]  potassium chloride (K-DUR) 10 MEQ tablet Take 1 tablet (10 mEq total) by mouth daily. Patient taking differently: Take 10 mEq by mouth 2 (two) times daily.  09/25/15  Yes Paraschos, Alexander, MD  RABEprazole (ACIPHEX) 20 MG tablet Take 20 mg by mouth daily.     Yes [provider]  sertraline (ZOLOFT) 50 MG tablet Take 50 mg by mouth at bedtime.     Yes [provider]  torsemide (DEMADEX) 20 MG tablet Take 10 mg by mouth as directed. Take on Tuesday and Friday   Yes [provider]    Allergies Codeine  Family History  Problem Relation Age of Onset  . Breast cancer Mother 14  . Heart disease Father   . Breast cancer Paternal Aunt 73  . Breast cancer Cousin   . Anesthesia problems Neg Hx   . Hypotension Neg Hx   . Malignant hyperthermia Neg Hx   . Pseudochol deficiency Neg Hx     Social History Social History   Tobacco Use  . Smoking status: Never Smoker  . Smokeless  tobacco: Never Used  Substance Use Topics  . Alcohol use: No  . Drug use: No    Review of Systems  Constitutional: Positive for fever/chills Eyes: No visual changes. ENT: No sore throat. Cardiovascular: Denies chest pain. Respiratory: Denies shortness of breath. Gastrointestinal: No abdominal pain.  Positive for nausea and vomiting.  No diarrhea.  No constipation. Genitourinary: Negative for dysuria. Musculoskeletal: Negative for back pain.  Positive for leg pain and swelling. Skin: Positive for rash. Neurological: Negative for headaches, focal weakness or numbness.  ____________________________________________   PHYSICAL EXAM:  VITAL SIGNS: ED Triage Vitals  Enc Vitals Group     BP      Pulse      Resp      Temp      Temp src      SpO2      Weight      Height      Head Circumference      Peak Flow      Pain Score      Pain Loc      Pain Edu?      Excl. in [place]?     Constitutional: Alert and oriented. Eyes: Conjunctivae are normal. Head: Atraumatic. Nose: No congestion/rhinnorhea. Mouth/Throat: Mucous membranes are moist. Neck: Normal ROM Cardiovascular: Tachycardic, regular rhythm. Grossly normal heart sounds. Respiratory: Normal respiratory effort.  No retractions. Lungs CTAB. Gastrointestinal: Soft and nontender. No distention. Genitourinary: deferred Musculoskeletal: Diffuse tenderness to right lower leg with erythema and warmth extending from the area of her right ankle to mid lower leg.  No palpable crepitus. Neurologic:  Normal speech and language. No gross focal neurologic deficits are appreciated. Skin:  Skin is warm, dry and intact. No rash noted. Psychiatric: Mood and affect are normal. Speech and behavior are normal.  ____________________________________________   LABS (all labs ordered are listed, but only abnormal results are displayed)  Labs Reviewed  CBC WITH DIFFERENTIAL/PLATELET - Abnormal; Notable for the following components:       Result Value   WBC 18.1 (*)    RBC 5.24 (*)    Neutro Abs 16.6 (*)    Abs Immature Granulocytes 0.22 (*)    All other components within normal limits  LACTIC ACID, PLASMA - Abnormal; Notable for the following components:   Lactic Acid, Venous 2.5 (*)  All other components within normal limits  LACTIC ACID, PLASMA - Abnormal; Notable for the following components:   Lactic Acid, Venous 2.9 (*)    All other components within normal limits  COMPREHENSIVE METABOLIC PANEL - Abnormal; Notable for the following components:   Potassium 3.0 (*)    Glucose, Bld 127 (*)    AST 58 (*)    Alkaline Phosphatase 233 (*)    GFR calc non Af Amer 59 (*)    All other components within normal limits  URINALYSIS, COMPLETE (UACMP) WITH MICROSCOPIC - Abnormal; Notable for the following components:   Color, Urine YELLOW (*)    APPearance CLEAR (*)    Hgb urine dipstick SMALL (*)    All other components within normal limits  CULTURE, BLOOD (ROUTINE X 2)  CULTURE, BLOOD (ROUTINE X 2)  URINE CULTURE  SARS CORONAVIRUS 2 (TAT 6-24 HRS)  LIPASE, BLOOD  APTT  PROTIME-INR  PROTIME-INR  CORTISOL-AM, BLOOD  PROCALCITONIN  COMPREHENSIVE METABOLIC PANEL  CBC   ____________________________________________  EKG  ED ECG REPORT I, Blake Divine, the attending physician, personally viewed and interpreted this ECG.   Date: 05/24/2019  EKG Time: 20:17  Rate: 105  Rhythm: sinus tachycardia  Axis: Normal  Intervals:none  ST&T Change: None   PROCEDURES  Procedure(s) performed (including Critical Care):  .Critical Care Performed by: Blake Divine, MD Authorized by: Blake Divine, MD   Critical care provider statement:    Critical care time (minutes):  45   Critical care time was exclusive of:  Separately billable procedures and treating other patients and teaching time   Critical care was necessary to treat or prevent imminent or life-threatening deterioration of the following conditions:   Sepsis   Critical care was time spent personally by me on the following activities:  Discussions with consultants, evaluation of patient's response to treatment, examination of patient, ordering and performing treatments and interventions, ordering and review of laboratory studies, ordering and review of radiographic studies, pulse oximetry, re-evaluation of patient's condition, obtaining history from patient or surrogate and review of old charts   I assumed direction of critical care for this patient from another provider in my specialty: no       ____________________________________________   INITIAL IMPRESSION / ASSESSMENT AND PLAN / ED COURSE       75 year old female presents to the ED with increasing malaise, subjective fevers, pain and swelling to her right lower extremity since earlier this afternoon.  Appearance of her right lower extremity is concerning for a cellulitis, she is also noted to be febrile and tachycardic, concerning for sepsis.  We will draw blood cultures and lactate, initiate IV fluid hydration and IV antibiotics.  While timeframe of onset is somewhat concerning for necrotizing infection, patient appears overall well and there is no crepitus on exam, pain is not out of proportion and patient declines pain medication at this time.  We will obtain x-ray and continue to monitor extent of cellulitis, anticipate admission.  There has been no significant extension of cellulitis and pain remains reasonably well controlled, I doubt necrotizing fasciitis.  X-ray is negative for acute process, no evidence of gas.  Case discussed with hospitalist, who accepts patient for admission.      ____________________________________________   FINAL CLINICAL IMPRESSION(S) / ED DIAGNOSES  Final diagnoses:  Sepsis without acute organ dysfunction, due to unspecified organism ([place])  Cellulitis, unspecified cellulitis site     ED Discharge Orders    None  Note:  This document  was prepared using Dragon voice recognition software and may include unintentional dictation errors.   Blake Divine, MD 05/25/19 0001

## 2019-05-24 NOTE — Progress Notes (Signed)
CODE SEPSIS - PHARMACY COMMUNICATION  **Broad Spectrum Antibiotics should be administered within 1 hour of Sepsis diagnosis**  Time Code Sepsis Called/Page Received:   3/4 @ 2038   Antibiotics Ordered:  Vanc, Cefepime, Metronidazole   Time of 1st antibiotic administration:   3/4 @ 2114   Additional action taken by pharmacy:   If necessary, Name of Provider/Nurse Contacted:     Lyric Hoar D ,PharmD Clinical Pharmacist  05/24/2019  9:27 PM

## 2019-05-25 LAB — URINE CULTURE: Culture: NO GROWTH

## 2019-05-25 LAB — CBC
HCT: 38.3 % (ref 36.0–46.0)
Hemoglobin: 11.8 g/dL — ABNORMAL LOW (ref 12.0–15.0)
MCH: 26.1 pg (ref 26.0–34.0)
MCHC: 30.8 g/dL (ref 30.0–36.0)
MCV: 84.7 fL (ref 80.0–100.0)
Platelets: 204 10*3/uL (ref 150–400)
RBC: 4.52 MIL/uL (ref 3.87–5.11)
RDW: 14.9 % (ref 11.5–15.5)
WBC: 23 10*3/uL — ABNORMAL HIGH (ref 4.0–10.5)
nRBC: 0 % (ref 0.0–0.2)

## 2019-05-25 LAB — LACTIC ACID, PLASMA: Lactic Acid, Venous: 1.2 mmol/L (ref 0.5–1.9)

## 2019-05-25 LAB — COMPREHENSIVE METABOLIC PANEL
ALT: 63 U/L — ABNORMAL HIGH (ref 0–44)
AST: 87 U/L — ABNORMAL HIGH (ref 15–41)
Albumin: 3 g/dL — ABNORMAL LOW (ref 3.5–5.0)
Alkaline Phosphatase: 186 U/L — ABNORMAL HIGH (ref 38–126)
Anion gap: 7 (ref 5–15)
BUN: 10 mg/dL (ref 8–23)
CO2: 23 mmol/L (ref 22–32)
Calcium: 8.3 mg/dL — ABNORMAL LOW (ref 8.9–10.3)
Chloride: 110 mmol/L (ref 98–111)
Creatinine, Ser: 0.8 mg/dL (ref 0.44–1.00)
GFR calc Af Amer: >60 mL/min (ref >60)
GFR calc non Af Amer: >60 mL/min (ref >60)
Glucose, Bld: 102 mg/dL — ABNORMAL HIGH (ref 70–99)
Potassium: 3.1 mmol/L — ABNORMAL LOW (ref 3.5–5.1)
Sodium: 140 mmol/L (ref 135–145)
Total Bilirubin: 0.9 mg/dL (ref 0.3–1.2)
Total Protein: 6.1 g/dL — ABNORMAL LOW (ref 6.5–8.1)

## 2019-05-25 LAB — CORTISOL-AM, BLOOD: Cortisol - AM: 23.4 ug/dL — ABNORMAL HIGH (ref 6.7–22.6)

## 2019-05-25 LAB — SARS CORONAVIRUS 2 (TAT 6-24 HRS): SARS Coronavirus 2: NEGATIVE

## 2019-05-25 LAB — PROTIME-INR
INR: 1.2 (ref 0.8–1.2)
Prothrombin Time: 14.9 seconds (ref 11.4–15.2)

## 2019-05-25 LAB — PROCALCITONIN: Procalcitonin: 2.16 ng/mL

## 2019-05-25 MED ORDER — LEVOTHYROXINE SODIUM 125 MCG PO TABS
125.0000 ug | ORAL_TABLET | Freq: Every day | ORAL | Status: DC
  Administered 2019-05-26 – 2019-05-29 (×4): 125 ug via ORAL
  Filled 2019-05-25 (×5): qty 1

## 2019-05-25 MED ORDER — PANTOPRAZOLE SODIUM 40 MG PO TBEC
40.0000 mg | DELAYED_RELEASE_TABLET | Freq: Every day | ORAL | Status: DC
  Administered 2019-05-25 – 2019-05-29 (×5): 40 mg via ORAL
  Filled 2019-05-25 (×5): qty 1

## 2019-05-25 MED ORDER — LETROZOLE 2.5 MG PO TABS
2.5000 mg | ORAL_TABLET | Freq: Every day | ORAL | Status: DC
  Administered 2019-05-25 – 2019-05-29 (×5): 2.5 mg via ORAL
  Filled 2019-05-25 (×5): qty 1

## 2019-05-25 MED ORDER — ATORVASTATIN CALCIUM 20 MG PO TABS
40.0000 mg | ORAL_TABLET | Freq: Every day | ORAL | Status: DC
  Administered 2019-05-25 – 2019-05-28 (×4): 40 mg via ORAL
  Filled 2019-05-25 (×4): qty 2

## 2019-05-25 MED ORDER — DILTIAZEM HCL ER COATED BEADS 300 MG PO CP24
300.0000 mg | ORAL_CAPSULE | Freq: Every day | ORAL | Status: DC
  Administered 2019-05-25 – 2019-05-29 (×5): 300 mg via ORAL
  Filled 2019-05-25 (×5): qty 1

## 2019-05-25 MED ORDER — VANCOMYCIN HCL IN DEXTROSE 1-5 GM/200ML-% IV SOLN
1000.0000 mg | INTRAVENOUS | Status: DC
  Administered 2019-05-26 (×2): 1000 mg via INTRAVENOUS
  Filled 2019-05-25 (×3): qty 200

## 2019-05-25 MED ORDER — POTASSIUM CHLORIDE CRYS ER 10 MEQ PO TBCR
10.0000 meq | EXTENDED_RELEASE_TABLET | Freq: Two times a day (BID) | ORAL | Status: DC
  Administered 2019-05-26 – 2019-05-29 (×7): 10 meq via ORAL
  Filled 2019-05-25 (×11): qty 1

## 2019-05-25 MED ORDER — POTASSIUM CHLORIDE CRYS ER 20 MEQ PO TBCR
40.0000 meq | EXTENDED_RELEASE_TABLET | Freq: Once | ORAL | Status: AC
  Administered 2019-05-25: 40 meq via ORAL
  Filled 2019-05-25: qty 2

## 2019-05-25 MED ORDER — ASPIRIN EC 81 MG PO TBEC
81.0000 mg | DELAYED_RELEASE_TABLET | Freq: Every day | ORAL | Status: DC
  Administered 2019-05-25 – 2019-05-29 (×5): 81 mg via ORAL
  Filled 2019-05-25 (×7): qty 1

## 2019-05-25 MED ORDER — SERTRALINE HCL 50 MG PO TABS
50.0000 mg | ORAL_TABLET | Freq: Every day | ORAL | Status: DC
  Administered 2019-05-25 – 2019-05-28 (×4): 50 mg via ORAL
  Filled 2019-05-25 (×4): qty 1

## 2019-05-25 MED ORDER — OXYBUTYNIN CHLORIDE 5 MG PO TABS
5.0000 mg | ORAL_TABLET | Freq: Every day | ORAL | Status: DC
  Administered 2019-05-25 – 2019-05-28 (×4): 5 mg via ORAL
  Filled 2019-05-25 (×5): qty 1

## 2019-05-25 MED ORDER — SODIUM CHLORIDE 0.9 % IV SOLN
INTRAVENOUS | Status: DC | PRN
  Administered 2019-05-25 – 2019-05-27 (×5): 250 mL via INTRAVENOUS

## 2019-05-25 NOTE — Progress Notes (Signed)
Pharmacy Antibiotic Note  CODEY MANKOWSKI is a 74 y.o. female admitted on 05/24/2019 with sepsis.  Pharmacy has been consulted for Cefepime dosing.  Plan: Cefepime 2gm IV q8hrs  Height: 5\' 3"  (160 cm) Weight: 187 lb (84.8 kg) IBW/kg (Calculated) : 52.4  Temp (24hrs), Avg:102 F (38.9 C), Min:100.2 F (37.9 C), Max:104.5 F (40.3 C)  Recent Labs  Lab 05/24/19 2051 05/24/19 2246  WBC 18.1*  --   CREATININE 0.95  --   LATICACIDVEN 2.9* 2.5*    Estimated Creatinine Clearance: 52.8 mL/min (by C-G formula based on SCr of 0.95 mg/dL).    Allergies  Allergen Reactions  . Codeine Other (See Comments)    Stroke like symptoms    Antimicrobials this admission: Cefepime 3/4 >>  Flagyl 3/4 >>  Vancomycin 3/4  Dose adjustments this admission:   Microbiology results:  BCx:   UCx:    Sputum:    MRSA PCR:   Thank you for allowing pharmacy to be a part of this patient's care.  [name] A 05/25/2019 12:03 AM

## 2019-05-25 NOTE — Consult Note (Addendum)
Pharmacy Antibiotic Note  Stacey Stone is a 74 y.o. female admitted on 05/24/2019 with cellulitis.  Pharmacy has been consulted for Vancomcyin dosing. Patient presented to the ED with complaints of of malaise associated with nausea and dry heaving and at the same time noticed  pain and redness in her right lower extremity.  She also reported chills.  She had mild lower abdominal pain only after severe bouts of dry heaving.  She states she was scratched by her dog on her right lower leg just over a week ago.  Vancomycin 2g given on 05/24/19 @ 2153  Plan: 1. Vancomycin 1g Q24H - next dose due at 2200 tonight.  AUC goal 400-550 Expected AUC  511 Expected Cssmin 12.2 Scr used 0.8 mg/dL; BMI > 30 (Vd 0.5 L/kg used)  Height: 5\' 3"  (160 cm) Weight: 187 lb (84.8 kg) IBW/kg (Calculated) : 52.4  Temp (24hrs), Avg:101.4 F (38.6 C), Min:99.8 F (37.7 C), Max:104.5 F (40.3 C)  Recent Labs  Lab 05/24/19 2051 05/24/19 2246 05/25/19 0518 05/25/19 0539  WBC 18.1*  --  23.0*  --   CREATININE 0.95  --  0.80  --   LATICACIDVEN 2.9* 2.5*  --  1.2    Estimated Creatinine Clearance: 62.7 mL/min (by C-G formula based on SCr of 0.8 mg/dL).    Allergies  Allergen Reactions  . Codeine Other (See Comments)    Stroke like symptoms    Antimicrobials this admission:   Dose adjustments this admission:   Microbiology results:   Thank you for allowing pharmacy to be a part of this patient's care.  Rowland Lathe 05/25/2019 9:05 AM

## 2019-05-25 NOTE — Progress Notes (Signed)
Patient has home cpap unit.  Placed at bedside for patient use. Humidifier will not close all the way and rest inside machine properly, creating a big leak. Patient states chamber is not the one she normally uses nightly.  Provided patient with auto cpap.for use. Patient has a resmed mask and circuit which attaches to hospital resmed unit. Patient able to put mask on herself. Able to power on and off cpap unit.

## 2019-05-25 NOTE — Progress Notes (Signed)
Dr. Billie Ruddy notified of nonsustained SVT.

## 2019-05-25 NOTE — Evaluation (Signed)
Occupational Therapy Evaluation Patient Details Name: Stacey Stone MRN: [id number] DOB: 1945-06-29 Today's Date: 05/25/2019    History of Present Illness Stacey Stone is a 74 y.o. female with medical history significant for coronary artery disease with occluded RCA, paroxysmal A. fib status post ablation 2019 with recurrence currently on Eliquis, history of breast cancer on letrozole as well as history of chronic lower extremity edema, OSA on CPAP, hypothyroidism and hypertension who presented to the ER with a several hour history of malaise associated with nausea and dry heaving and at the same time noticed pain and redness in her RLE. X-ray of tibia and fibula on 05/24/2019, no acute abnormality, no gas.   Clinical Impression   [name] was seen for OT evaluation this date. Prior to hospital admission, pt was Independent with I/ADLs, including caregiving for adult son with Down Syndrome and working full time. Pt lives in a two story town home with bedroom/bathroom on main level. Currently pt demonstrates impairments as described below (See OT problem list) which functionally limit her ability to perform ADL/self-care tasks. Pt deferred OOB activity this session 2/2 fatigue. Pt currently requires MOD I for UB access at bed level. Pt would benefit from skilled OT to address noted functional limitations (see below for any additional details) and further assess LB access for ADLs in order to maximize safety and independence while minimizing falls risk and caregiver burden. Upon hospital discharge, recommend HHOT to maximize pt safety and return to functional independence during meaningful occupations of daily life. Expect pt may progress.    Follow Up Recommendations  Home health OT;Supervision/Assistance - 24 hour    Equipment Recommendations       Recommendations for Other Services       Precautions / Restrictions Precautions Precautions: Fall Restrictions Weight Bearing  Restrictions: No      Mobility Bed Mobility Overal bed mobility: Needs Assistance Bed Mobility: Rolling Rolling: Supervision         General bed mobility comments: SUP adjust rear and BLE in bed   Transfers                 General transfer comment: Pt deferred OOB mobility 2/2 mild anxiety re: her BP (99/53) and fatigue    Balance Overall balance assessment: (NT)                                         ADL either performed or assessed with clinical judgement   ADL Overall ADL's : Needs assistance/impaired                                       General ADL Comments: MOD I don/doff glasses bed level and simulated UBD c HOB at 40*     Vision         Perception     Praxis      Pertinent Vitals/Pain Pain Assessment: Faces Faces Pain Scale: Hurts a little bit Pain Location: not specified Pain Descriptors / Indicators: Discomfort Pain Intervention(s): Limited activity within patient's tolerance     Hand Dominance     Extremity/Trunk Assessment Upper Extremity Assessment Upper Extremity Assessment: Overall WFL for tasks assessed(BUE AROM WFL, MMT WFL)   Lower Extremity Assessment Lower Extremity Assessment: Overall WFL for tasks assessed  Communication Communication Communication: No difficulties   Cognition Arousal/Alertness: Awake/alert Behavior During Therapy: WFL for tasks assessed/performed Overall Cognitive Status: Within Functional Limits for tasks assessed                                     General Comments  Reclined in bed after prolonged conversation: BP 99/53, MAP 68, HR 61, SpO2 96% on RA    Exercises Exercises: Other exercises Other Exercises Other Exercises: Pt educated re: edema management techniques, AE/DME recommendations, importance of OOB activity for improved functional strengthening/endurance, falls prevention Other Exercises: IS (provided) use and frequency, bed  mobility, SLR   Shoulder Instructions      Home Living Family/patient expects to be discharged to:: Private residence Living Arrangements: Children(Adult son c Down Syndrome ) Available Help at Discharge: Family;Neighbor;Available PRN/intermittently(Brother & Neighbours available t/o day) Type of Home: House(Townhouse) Home Access: Other (comment)(Threshold step)     Home Layout: Two level;Able to live on main level with bedroom/bathroom Alternate Level Stairs-Number of Steps: flight(sons rooms upstairs - pt rarely goes up there)   Bathroom Shower/Tub: Occupational psychologist: Standard     Home Equipment: Cane - single point;Walker - 2 wheels;Shower seat - built in          Prior Functioning/Environment Level of Independence: Independent        Comments: 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 Does not drive at night. No history of falls.         OT Problem List: Decreased range of motion;Decreased activity tolerance;Impaired vision/perception;Increased edema      OT Treatment/Interventions: Self-care/ADL training;Therapeutic exercise;Energy conservation;DME and/or AE instruction;Therapeutic activities;Visual/perceptual remediation/compensation;Patient/family education;Balance training    OT Goals(Current goals can be found in the care plan section) Acute Rehab OT Goals Patient Stated Goal: To return to work OT Goal Formulation: With patient Time For Goal Achievement: 06/08/19 Potential to Achieve Goals: Good ADL Goals Pt Will Perform Lower Body Dressing: with modified independence;sit to/from stand(c AE & LRAD PRN) Pt Will Transfer to Toilet: with modified independence;ambulating;regular height toilet(c LRAD PRN) Pt Will Perform Toileting - Clothing Manipulation and hygiene: with modified independence;sit to/from stand(c LRAD PRN)  OT Frequency: Min 1X/week   Barriers to D/C:  Decreased caregiver support          Co-evaluation              AM-PAC OT "6 Clicks" Daily Activity     Outcome Measure Help from another person eating meals?: None Help from another person taking care of personal grooming?: None Help from another person toileting, which includes using toliet, bedpan, or urinal?: A Little Help from another person bathing (including washing, rinsing, drying)?: A Lot Help from another person to put on and taking off regular upper body clothing?: A Little Help from another person to put on and taking off regular lower body clothing?: A Lot 6 Click Score: 18   End of Session    Activity Tolerance: Patient tolerated treatment well;Patient limited by fatigue Patient left: in bed;with call bell/phone within reach  OT Visit Diagnosis: Other abnormalities of gait and mobility (R26.89)                Time: [id number] OT Time Calculation (min): 25 min Charges:  OT General Charges $OT Visit: 1 Visit OT Evaluation $OT Eval Low Complexity: 1  Low OT Treatments $Self Care/Home Management : 8-22 mins  Dessie Coma, M.S. OTR/L  05/25/19, 3:18 PM

## 2019-05-25 NOTE — Progress Notes (Signed)
PROGRESS NOTE    Stacey Stone  [id number] DOB: 16-Dec-1945 DOA: 05/24/2019 PCP: Rusty Aus, MD    Assessment & Plan:   Principal Problem:   Sepsis [facility]) Active Problems:   CAD S/P percutaneous coronary angioplasty   Essential hypertension   Hypothyroidism   OSA on CPAP   Paroxysmal atrial fibrillation (HCC)   Cellulitis of right lower extremity   Bilateral edema of lower extremity    Stacey Stone is a 74 y.o. female with medical history significant for coronary artery disease with occluded RCA, paroxysmal A. fib status post ablation 2019 with recurrence currently on Eliquis, history of breast cancer on letrozole as well as history of chronic lower extremity edema, OSA on CPAP, hypothyroidism and hypertension who presents to the emergency room with a several hour history of malaise associated with nausea and dry heaving and at the same time noticed  pain and redness in her right lower extremity.   Sepsis ([place])   Cellulitis of right lower extremity -sepsis criteria with fever, tachycardia, leukocytosis and lactic acid 2.9 PLAN: -Continue cefepime, metronidazole and vancomycin --d/c MIVF since pt is taking oral hydration and BP wnl -Follow blood cultures    CAD S/P percutaneous coronary angioplasty -No complaints of chest pain -Continue aspirin, atorvastatin --Hold Imdur for now    Essential hypertension -Blood pressure controlled -Continue diltiazem --Hold home metop and Imdur for now    Hypothyroidism -Continue levothyroxine    OSA on CPAP -CPAP nightly, with home machine    Paroxysmal atrial fibrillation ([place]) -History of ablation 2019 with recurrence -Eliquis to continue -Continue diltiazem --Hold home metop for now due to sepsis and sometimes low normal BP   DVT prophylaxis: [id number] Code Status: Full code  Family Communication: updated daughter at bedside Disposition Plan: Home likely next Monday if cellulitis  improves   Subjective and Interval History:  Pt reported leg pain improved.  No more fever, dyspnea, chest pain, abdominal pain, N/V/D, dysuria.   Objective: Vitals:   05/25/19 0000 05/25/19 0833 05/25/19 1343 05/25/19 1553  BP: 118/68 125/63 (!) 105/42 128/84  Pulse: 92 74 63 99  Resp: 18 16    Temp: (!) 101.2 F (38.4 C) 99.8 F (37.7 C) 98.2 F (36.8 C) 99.4 F (37.4 C)  TempSrc: Oral Oral Oral Oral  SpO2: 92% 95% 95% 100%  Weight:      Height:        Intake/Output Summary (Last 24 hours) at 05/25/2019 1737 Last data filed at 05/24/2019 2237 Gross per 24 hour  Intake 1200 ml  Output --  Net 1200 ml   Filed Weights   05/24/19 2018  Weight: 84.8 kg    Examination:   Constitutional: NAD, AAOx3 HEENT: conjunctivae and lids normal, EOMI CV: RRR no M,R,G. Distal pulses +2.  No cyanosis.   RESP: CTA B/L, normal respiratory effort  GI: +BS, NTND Extremities: Erythema receded from the previously marked boundary.  Right LE more swollen than left. SKIN: warm, dry and intact Neuro: II - XII grossly intact.  Sensation intact Psych: Normal mood and affect.  Appropriate judgement and reason   Data Reviewed: I have personally reviewed following labs and imaging studies  CBC: Recent Labs  Lab 05/24/19 2051 05/25/19 0518  WBC 18.1* 23.0*  NEUTROABS 16.6*  --   HGB 13.9 11.8*  HCT 43.1 38.3  MCV 82.3 84.7  PLT 232 0000000   Basic Metabolic Panel: Recent Labs  Lab 05/24/19 2051 05/25/19 0518  NA 138 140  K 3.0* 3.1*  CL 102 110  CO2 24 23  GLUCOSE 127* 102*  BUN 12 10  CREATININE 0.95 0.80  CALCIUM 9.7 8.3*   GFR: Estimated Creatinine Clearance: 62.7 mL/min (by C-G formula based on SCr of 0.8 mg/dL). Liver Function Tests: Recent Labs  Lab 05/24/19 2051 05/25/19 0518  AST 58* 87*  ALT 36 63*  ALKPHOS 233* 186*  BILITOT 1.1 0.9  PROT 7.7 6.1*  ALBUMIN 4.0 3.0*   Recent Labs  Lab 05/24/19 2051  LIPASE 21   No results for input(s): AMMONIA in the  last 168 hours. Coagulation Profile: Recent Labs  Lab 05/24/19 2051 05/25/19 0518  INR 1.1 1.2   Cardiac Enzymes: No results for input(s): CKTOTAL, CKMB, CKMBINDEX, TROPONINI in the last 168 hours. BNP (last 3 results) No results for input(s): PROBNP in the last 8760 hours. HbA1C: No results for input(s): HGBA1C in the last 72 hours. CBG: No results for input(s): GLUCAP in the last 168 hours. Lipid Profile: No results for input(s): CHOL, HDL, LDLCALC, TRIG, CHOLHDL, LDLDIRECT in the last 72 hours. Thyroid Function Tests: No results for input(s): TSH, T4TOTAL, FREET4, T3FREE, THYROIDAB in the last 72 hours. Anemia Panel: No results for input(s): VITAMINB12, FOLATE, FERRITIN, TIBC, IRON, RETICCTPCT in the last 72 hours. Sepsis Labs: Recent Labs  Lab 05/24/19 2051 05/24/19 2246 05/25/19 0518 05/25/19 0539  PROCALCITON  --   --  2.16  --   LATICACIDVEN 2.9* 2.5*  --  1.2    Recent Results (from the past 240 hour(s))  Culture, blood (routine x 2)     Status: None (Preliminary result)   Collection Time: 05/24/19  8:50 PM   Specimen: BLOOD  Result Value Ref Range Status   Specimen Description BLOOD RIGHT ARM  Final   Special Requests   Final    BOTTLES DRAWN AEROBIC AND ANAEROBIC Blood Culture results may not be optimal due to an excessive volume of blood received in culture bottles   Culture   Final    NO GROWTH < 12 HOURS Performed at [facility], [address] [place], [place] [address]    Report Status PENDING  Incomplete  Culture, blood (routine x 2)     Status: None (Preliminary result)   Collection Time: 05/24/19  8:51 PM   Specimen: BLOOD  Result Value Ref Range Status   Specimen Description BLOOD BLOOD RIGHT HAND  Final   Special Requests   Final    BOTTLES DRAWN AEROBIC AND ANAEROBIC Blood Culture adequate volume   Culture   Final    NO GROWTH < 12 HOURS Performed at [facility], [address] [place], [place] [address]    Report  Status PENDING  Incomplete  SARS CORONAVIRUS 2 (TAT 6-24 HRS) Nasopharyngeal Nasopharyngeal Swab     Status: None   Collection Time: 05/24/19  9:56 PM   Specimen: Nasopharyngeal Swab  Result Value Ref Range Status   SARS Coronavirus 2 NEGATIVE NEGATIVE Final    Comment: (NOTE) SARS-CoV-2 target nucleic acids are NOT DETECTED. The SARS-CoV-2 RNA is generally detectable in upper and lower respiratory specimens during the acute phase of infection. Negative results do not preclude SARS-CoV-2 infection, do not rule out co-infections with other pathogens, and should not be used as the sole basis for treatment or other patient management decisions. Negative results must be combined with clinical observations, patient history, and epidemiological information. The expected result is Negative. Fact Sheet for Patients: SugarRoll.be Fact Sheet for Healthcare  Providers: https://www.woods-mathews.com/ This test is not yet approved or cleared by the [place] and  has been authorized for detection and/or diagnosis of SARS-CoV-2 by FDA under an Emergency Use Authorization (EUA). This EUA will remain  in effect (meaning this test can be used) for the duration of the COVID-19 declaration under Section 56 4(b)(1) of the Act, 21 U.S.C. section 360bbb-3(b)(1), unless the authorization is terminated or revoked sooner. Performed at [facility], [place] [address], [place], [place] [address]       Radiology Studies: DG Chest 1 View  Result Date: 05/24/2019 CLINICAL DATA:  Fevers EXAM: CHEST  1 VIEW COMPARISON:  06/06/2018 FINDINGS: Cardiac shadow is mildly enlarged but stable. Tortuous thoracic aorta is seen. The lungs are well aerated bilaterally. No bony abnormality is seen. IMPRESSION: No acute abnormality noted. Electronically Signed   By: Inez Catalina M.D.   On: 05/24/2019 21:46   DG Tibia/Fibula Right  Result Date: 05/24/2019 CLINICAL DATA:   Right leg pain and swelling, no known injury, initial encounter EXAM: RIGHT TIBIA AND FIBULA - 2 VIEW COMPARISON:  04/24/2017 FINDINGS: Partial medial joint replacement is noted similar to that seen on prior plain film examination. No acute fracture or dislocation is seen. No gross soft tissue abnormality is noted. IMPRESSION: No acute abnormality seen. Electronically Signed   By: Inez Catalina M.D.   On: 05/24/2019 21:47     Scheduled Meds: . apixaban  5 mg Oral BID  . aspirin EC  81 mg Oral Daily  . atorvastatin  40 mg Oral QHS  . diltiazem  300 mg Oral Daily  . letrozole  2.5 mg Oral Daily  . [START ON 05/26/2019] levothyroxine  125 mcg Oral Q0600  . oxybutynin  5 mg Oral QHS  . pantoprazole  40 mg Oral Daily  . [START ON 05/26/2019] potassium chloride  10 mEq Oral BID  . sertraline  50 mg Oral QHS   Continuous Infusions: . ceFEPime (MAXIPIME) IV 2 g (05/25/19 1332)  . metronidazole 500 mg (05/25/19 1510)  . vancomycin       LOS: 1 day     Enzo Bi, MD [facility] If 7PM-7AM, please contact night-coverage 05/25/2019, 5:37 PM

## 2019-05-25 NOTE — Evaluation (Signed)
Physical Therapy Evaluation Patient Details Name: Stacey Stone MRN: [id number] DOB: September 09, 1945 Today's Date: 05/25/2019   History of Present Illness  74 y.o. female with medical history significant for coronary artery disease with occluded RCA, paroxysmal A. fib status post ablation 2019 with recurrence currently on Eliquis, history of breast cancer on letrozole as well as history of chronic lower extremity edema, OSA on CPAP, hypothyroidism and hypertension who presented to the ER with a several hour history of malaise associated with nausea and dry heaving and at the same time noticed pain and redness in her RLE. X-ray of tibia and fibula on 05/24/2019.  Clinical Impression  Pt pleasant and eager to get out of bed after not wanting to do much and napping t/o most of the day.  She ultimately did quite well, she managed ~200 ft of gait training (mostly w/o AD) around the nurses station with guarded but safe ambulation.  Pt is typically able to ambulate with more confidence and less fatigue (O2 down to 83% on room air post ambulation) but generally speaking she did well.  Pt will be able to go home safely, has support from family as needed. Depending on her level of improvement with gait she feels that she may not need HHPT, but for now agrees she is far enough from her baseline that this would be beneficial.  Overall pt did well, spoke with nursing to encourage mobility.    Follow Up Recommendations Home health PT(per in-house progress may not be necessary)    Equipment Recommendations  None recommended by PT    Recommendations for Other Services       Precautions / Restrictions Precautions Precautions: Fall Restrictions Weight Bearing Restrictions: No      Mobility  Bed Mobility Overal bed mobility: Modified Independent Bed Mobility: Supine to Sit Rolling: Supervision   Supine to sit: Min guard     General bed mobility comments: using rails she was able to get to sitting w/o  moderate effort  Transfers Overall transfer level: Modified independent Equipment used: Rolling walker (2 wheeled)             General transfer comment: Pt able to rise to standing with only minimal cuing for sequencing and hand placement, did use walker to maintain balance in standing  Ambulation/Gait Ambulation/Gait assistance: Min guard Gait Distance (Feet): 200 Feet Assistive device: Rolling walker (2 wheeled);None       General Gait Details: First ~30 ft with walker as pt was feeling weak and unsure, however she was able to quickly transition to light rail use and then ~150 ft w/o UE with slow, guarded, but safe ambulation.  Her O2 did drop with the effort slowly down to the low 80s with some SOB (on room air) but withing 2 minutes of sitting she was back in the 90s.    Stairs            Wheelchair Mobility    Modified Rankin (Stroke Patients Only)       Balance Overall balance assessment: Needs assistance   Sitting balance-Leahy Scale: Good Sitting balance - Comments: able to don socks while sitting EOB   Standing balance support: No upper extremity supported Standing balance-Leahy Scale: Fair Standing balance comment: Pt with guarded posture and initial need for light UE assist, but did no have any overt LOBs t/o standing/ambulation  Pertinent Vitals/Pain Pain Assessment: Faces Faces Pain Scale: Hurts a little bit Pain Location: swollen R leg Pain Descriptors / Indicators: Discomfort Pain Intervention(s): Limited activity within patient's tolerance    Home Living Family/patient expects to be discharged to:: Private residence Living Arrangements: Children Available Help at Discharge: Family;Neighbor;Available PRN/intermittently Type of Home: House Home Access: Other (comment)(threshold)     Home Layout: Two level;Able to live on main level with bedroom/bathroom Home Equipment: Kasandra Knudsen - single point;Walker - 2  wheels;Shower seat - built in      Prior Function Level of Independence: Independent         Comments: Pt works full time (can work from home but typically at work with 20 steps to get to office), 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 Does not drive at night. No history of falls.      Hand Dominance        Extremity/Trunk Assessment   Upper Extremity Assessment Upper Extremity Assessment: Overall WFL for tasks assessed    Lower Extremity Assessment Lower Extremity Assessment: Overall WFL for tasks assessed       Communication   Communication: No difficulties  Cognition Arousal/Alertness: Awake/alert Behavior During Therapy: WFL for tasks assessed/performed Overall Cognitive Status: Within Functional Limits for tasks assessed                                        General Comments General comments (skin integrity, edema, etc.): Reclined in bed after prolonged conversation: BP 99/53, MAP 68, HR 61, SpO2 96% on RA    Exercises Other Exercises Other Exercises: Pt educated re: edema management techniques, AE/DME recommendations, importance of OOB activity for improved functional strengthening/endurance, falls prevention Other Exercises: IS (provided) use and frequency, bed mobility, SLR   Assessment/Plan    PT Assessment Patient needs continued PT services  PT Problem List Decreased strength;Decreased range of motion;Decreased activity tolerance;Decreased balance;Decreased mobility;Decreased coordination;Decreased knowledge of use of DME;Decreased safety awareness;Pain;Cardiopulmonary status limiting activity       PT Treatment Interventions Gait training;Stair training;Functional mobility training;DME instruction;Therapeutic activities;Therapeutic exercise;Balance training;Neuromuscular re-education;Patient/family education    PT Goals (Current goals can be found in the Care Plan section)  Acute Rehab PT Goals Patient Stated Goal:  To return to work PT Goal Formulation: With patient Time For Goal Achievement: 06/08/19 Potential to Achieve Goals: Good    Frequency Min 2X/week   Barriers to discharge        Co-evaluation               AM-PAC PT "6 Clicks" Mobility  Outcome Measure Help needed turning from your back to your side while in a flat bed without using bedrails?: None Help needed moving from lying on your back to sitting on the side of a flat bed without using bedrails?: None Help needed moving to and from a bed to a chair (including a wheelchair)?: None Help needed standing up from a chair using your arms (e.g., wheelchair or bedside chair)?: None Help needed to walk in hospital room?: A Little Help needed climbing 3-5 steps with a railing? : A Little 6 Click Score: 22    End of Session Equipment Utilized During Treatment: Gait belt Activity Tolerance: Patient tolerated treatment well;Patient limited by fatigue Patient left: with call bell/phone within reach;with chair alarm set Nurse Communication: Mobility status(O2 drop) PT Visit Diagnosis: Muscle weakness (generalized) (M62.81);Difficulty in walking, not elsewhere  classified (R26.2);Unsteadiness on feet (R26.81);Pain Pain - Right/Left: Left Pain - part of body: Leg    Time: [id number] PT Time Calculation (min) (ACUTE ONLY): 28 min   Charges:   PT Evaluation $PT Eval Low Complexity: 1 Low PT Treatments $Gait Training: 8-22 mins        Kreg Shropshire, DPT 05/25/2019, 6:02 PM

## 2019-05-26 LAB — MAGNESIUM: Magnesium: 1.8 mg/dL (ref 1.7–2.4)

## 2019-05-26 LAB — CBC
HCT: 38 % (ref 36.0–46.0)
Hemoglobin: 12.1 g/dL (ref 12.0–15.0)
MCH: 26.6 pg (ref 26.0–34.0)
MCHC: 31.8 g/dL (ref 30.0–36.0)
MCV: 83.5 fL (ref 80.0–100.0)
Platelets: 185 10*3/uL (ref 150–400)
RBC: 4.55 MIL/uL (ref 3.87–5.11)
RDW: 15.3 % (ref 11.5–15.5)
WBC: 12.1 10*3/uL — ABNORMAL HIGH (ref 4.0–10.5)
nRBC: 0 % (ref 0.0–0.2)

## 2019-05-26 LAB — BASIC METABOLIC PANEL
Anion gap: 10 (ref 5–15)
BUN: 10 mg/dL (ref 8–23)
CO2: 23 mmol/L (ref 22–32)
Calcium: 8.8 mg/dL — ABNORMAL LOW (ref 8.9–10.3)
Chloride: 105 mmol/L (ref 98–111)
Creatinine, Ser: 0.73 mg/dL (ref 0.44–1.00)
GFR calc Af Amer: >60 mL/min (ref >60)
GFR calc non Af Amer: >60 mL/min (ref >60)
Glucose, Bld: 114 mg/dL — ABNORMAL HIGH (ref 70–99)
Potassium: 3.5 mmol/L (ref 3.5–5.1)
Sodium: 138 mmol/L (ref 135–145)

## 2019-05-26 MED ORDER — ISOSORBIDE MONONITRATE ER 30 MG PO TB24
30.0000 mg | ORAL_TABLET | Freq: Every day | ORAL | Status: DC
  Administered 2019-05-26 – 2019-05-29 (×4): 30 mg via ORAL
  Filled 2019-05-26 (×5): qty 1

## 2019-05-26 MED ORDER — METOPROLOL SUCCINATE ER 50 MG PO TB24
50.0000 mg | ORAL_TABLET | Freq: Two times a day (BID) | ORAL | Status: DC
  Administered 2019-05-26 – 2019-05-29 (×7): 50 mg via ORAL
  Filled 2019-05-26 (×7): qty 1

## 2019-05-26 MED ORDER — DILTIAZEM HCL 30 MG PO TABS
30.0000 mg | ORAL_TABLET | Freq: Two times a day (BID) | ORAL | Status: DC | PRN
  Administered 2019-05-26: 30 mg via ORAL
  Filled 2019-05-26 (×2): qty 1

## 2019-05-26 MED ORDER — KETOROLAC TROMETHAMINE 30 MG/ML IJ SOLN: 15.0000 mg | Freq: Once | INTRAMUSCULAR | Status: DC

## 2019-05-26 NOTE — Progress Notes (Signed)
This am HR goes up to 100-170's not sustaining.  In&out A.fib. It happened last night as well.  Pt denies chest pain nor jaw pain and is asymptomatic. Rest of VSS. Scheduled cardiac meds given. Notified Dr Billie Ruddy of the above.  Per MD metorolol resumed this am.  Will continue to monitor.

## 2019-05-26 NOTE — Progress Notes (Signed)
PROGRESS NOTE    Stacey Stone  [id number] DOB: Jul 28, 1945 DOA: 05/24/2019 PCP: Rusty Aus, MD    Assessment & Plan:   Principal Problem:   Sepsis [facility]) Active Problems:   CAD S/P percutaneous coronary angioplasty   Essential hypertension   Hypothyroidism   OSA on CPAP   Paroxysmal atrial fibrillation (HCC)   Cellulitis of right lower extremity   Bilateral edema of lower extremity    Stacey Stone is a 74 y.o. female with medical history significant for coronary artery disease with occluded RCA, paroxysmal A. fib status post ablation 2019 with recurrence currently on Eliquis, history of breast cancer on letrozole as well as history of chronic lower extremity edema, OSA on CPAP, hypothyroidism and hypertension who presents to the emergency room with a several hour history of malaise associated with nausea and dry heaving and at the same time noticed  pain and redness in her right lower extremity.   Sepsis ([place])   Cellulitis of right lower extremity -sepsis criteria with fever, tachycardia, leukocytosis and lactic acid 2.9 PLAN: -Continue cefepime, metronidazole and vancomycin since cellulitis hasn't improved since yesterday -Follow blood cultures    CAD S/P percutaneous coronary angioplasty -No complaints of chest pain -Continue aspirin, atorvastatin, Imdur, metop    Essential hypertension -Blood pressure controlled -Continue diltiazem and metop    Hypothyroidism -Continue levothyroxine    OSA on CPAP -CPAP nightly, with home machine    Paroxysmal atrial fibrillation (HCC) -History of ablation 2019 with recurrence -Eliquis to continue -Continue diltiazem and metop   DVT prophylaxis: [id number] Code Status: Full code  Family Communication: updated daughter at bedside today Disposition Plan: Home likely next Monday if cellulitis improves   Subjective and Interval History:  Pt reported feeling better, though complained of mild dyspnea.   No fever, chest pain, abdominal pain, N/V/D, dysuria.   Objective: Vitals:   05/26/19 0026 05/26/19 0303 05/26/19 0917 05/26/19 1213  BP: 120/88 (!) 140/97 113/71   Pulse: (!) 108 92 89 89  Resp: 16 16 18    Temp: (!) 97.5 F (36.4 C) 98.7 F (37.1 C) 98.4 F (36.9 C)   TempSrc: Oral Oral Oral   SpO2: 95% 94% 94%   Weight:      Height:        Intake/Output Summary (Last 24 hours) at 05/26/2019 1327 Last data filed at 05/26/2019 1016 Gross per 24 hour  Intake 1123.75 ml  Output --  Net 1123.75 ml   Filed Weights   05/24/19 2018  Weight: 84.8 kg    Examination:   Constitutional: NAD, AAOx3 HEENT: conjunctivae and lids normal, EOMI CV: RRR no M,R,G. Distal pulses +2.  No cyanosis.   RESP: CTA B/L, normal respiratory effort  GI: +BS, NTND Extremities: Erythema receded from the previously marked boundary, however still red, warm and swollen unchanged from yesterday.   SKIN: warm, dry and intact Neuro: II - XII grossly intact.  Sensation intact Psych: Normal mood and affect.  Appropriate judgement and reason   Data Reviewed: I have personally reviewed following labs and imaging studies  CBC: Recent Labs  Lab 05/24/19 2051 05/25/19 0518 05/26/19 0641  WBC 18.1* 23.0* 12.1*  NEUTROABS 16.6*  --   --   HGB 13.9 11.8* 12.1  HCT 43.1 38.3 38.0  MCV 82.3 84.7 83.5  PLT 232 204 [id number]   Basic Metabolic Panel: Recent Labs  Lab 05/24/19 2051 05/25/19 0518 05/26/19 0641  NA 138 140 138  K 3.0* 3.1* 3.5  CL 102 110 105  CO2 24 23 23   GLUCOSE 127* 102* 114*  BUN 12 10 10   CREATININE 0.95 0.80 0.73  CALCIUM 9.7 8.3* 8.8*  MG  --   --  1.8   GFR: Estimated Creatinine Clearance: 62.7 mL/min (by C-G formula based on SCr of 0.73 mg/dL). Liver Function Tests: Recent Labs  Lab 05/24/19 2051 05/25/19 0518  AST 58* 87*  ALT 36 63*  ALKPHOS 233* 186*  BILITOT 1.1 0.9  PROT 7.7 6.1*  ALBUMIN 4.0 3.0*   Recent Labs  Lab 05/24/19 2051  LIPASE 21   No results for  input(s): AMMONIA in the last 168 hours. Coagulation Profile: Recent Labs  Lab 05/24/19 2051 05/25/19 0518  INR 1.1 1.2   Cardiac Enzymes: No results for input(s): CKTOTAL, CKMB, CKMBINDEX, TROPONINI in the last 168 hours. BNP (last 3 results) No results for input(s): PROBNP in the last 8760 hours. HbA1C: No results for input(s): HGBA1C in the last 72 hours. CBG: No results for input(s): GLUCAP in the last 168 hours. Lipid Profile: No results for input(s): CHOL, HDL, LDLCALC, TRIG, CHOLHDL, LDLDIRECT in the last 72 hours. Thyroid Function Tests: No results for input(s): TSH, T4TOTAL, FREET4, T3FREE, THYROIDAB in the last 72 hours. Anemia Panel: No results for input(s): VITAMINB12, FOLATE, FERRITIN, TIBC, IRON, RETICCTPCT in the last 72 hours. Sepsis Labs: Recent Labs  Lab 05/24/19 2051 05/24/19 2246 05/25/19 0518 05/25/19 0539  PROCALCITON  --   --  2.16  --   LATICACIDVEN 2.9* 2.5*  --  1.2    Recent Results (from the past 240 hour(s))  Culture, blood (routine x 2)     Status: None (Preliminary result)   Collection Time: 05/24/19  8:50 PM   Specimen: BLOOD  Result Value Ref Range Status   Specimen Description BLOOD RIGHT ARM  Final   Special Requests   Final    BOTTLES DRAWN AEROBIC AND ANAEROBIC Blood Culture results may not be optimal due to an excessive volume of blood received in culture bottles   Culture   Final    NO GROWTH 2 DAYS Performed at [facility], [address], [place], [place] [address]    Report Status PENDING  Incomplete  Urine culture     Status: None   Collection Time: 05/24/19  8:50 PM   Specimen: Urine, Random  Result Value Ref Range Status   Specimen Description   Final    URINE, RANDOM Performed at [facility], [address], [place], [place] [address]    Special Requests   Final    NONE Performed at [facility], [address], [place], [place] [address]    Culture   Final    NO GROWTH Performed  at [facility], [place] [address] [place], [place] [address]    Report Status 05/25/2019 FINAL  Final  Culture, blood (routine x 2)     Status: None (Preliminary result)   Collection Time: 05/24/19  8:51 PM   Specimen: BLOOD  Result Value Ref Range Status   Specimen Description BLOOD BLOOD RIGHT HAND  Final   Special Requests   Final    BOTTLES DRAWN AEROBIC AND ANAEROBIC Blood Culture adequate volume   Culture   Final    NO GROWTH 2 DAYS Performed at [facility], [place], [place] [address]    Report Status PENDING  Incomplete  SARS CORONAVIRUS 2 (TAT 6-24 HRS) Nasopharyngeal Nasopharyngeal Swab  Status: None   Collection Time: 05/24/19  9:56 PM   Specimen: Nasopharyngeal Swab  Result Value Ref Range Status   SARS Coronavirus 2 NEGATIVE NEGATIVE Final    Comment: (NOTE) SARS-CoV-2 target nucleic acids are NOT DETECTED. The SARS-CoV-2 RNA is generally detectable in upper and lower respiratory specimens during the acute phase of infection. Negative results do not preclude SARS-CoV-2 infection, do not rule out co-infections with other pathogens, and should not be used as the sole basis for treatment or other patient management decisions. Negative results must be combined with clinical observations, patient history, and epidemiological information. The expected result is Negative. Fact Sheet for Patients: SugarRoll.be Fact Sheet for Healthcare Providers: https://www.woods-mathews.com/ This test is not yet approved or cleared by the [place] FDA and  has been authorized for detection and/or diagnosis of SARS-CoV-2 by FDA under an Emergency Use Authorization (EUA). This EUA will remain  in effect (meaning this test can be used) for the duration of the COVID-19 declaration under Section 56 4(b)(1) of the Act, 21 U.S.C. section 360bbb-3(b)(1), unless the authorization is terminated or revoked  sooner. Performed at [facility], [place] [address], [place], [place] [address]       Radiology Studies: DG Chest 1 View  Result Date: 05/24/2019 CLINICAL DATA:  Fevers EXAM: CHEST  1 VIEW COMPARISON:  06/06/2018 FINDINGS: Cardiac shadow is mildly enlarged but stable. Tortuous thoracic aorta is seen. The lungs are well aerated bilaterally. No bony abnormality is seen. IMPRESSION: No acute abnormality noted. Electronically Signed   By: Inez Catalina M.D.   On: 05/24/2019 21:46   DG Tibia/Fibula Right  Result Date: 05/24/2019 CLINICAL DATA:  Right leg pain and swelling, no known injury, initial encounter EXAM: RIGHT TIBIA AND FIBULA - 2 VIEW COMPARISON:  04/24/2017 FINDINGS: Partial medial joint replacement is noted similar to that seen on prior plain film examination. No acute fracture or dislocation is seen. No gross soft tissue abnormality is noted. IMPRESSION: No acute abnormality seen. Electronically Signed   By: Inez Catalina M.D.   On: 05/24/2019 21:47     Scheduled Meds: . apixaban  5 mg Oral BID  . aspirin EC  81 mg Oral Daily  . atorvastatin  40 mg Oral QHS  . diltiazem  300 mg Oral Daily  . isosorbide mononitrate  30 mg Oral Daily  . letrozole  2.5 mg Oral Daily  . levothyroxine  125 mcg Oral Q0600  . metoprolol succinate  50 mg Oral BID  . oxybutynin  5 mg Oral QHS  . pantoprazole  40 mg Oral Daily  . potassium chloride  10 mEq Oral BID  . sertraline  50 mg Oral QHS   Continuous Infusions: . sodium chloride Stopped (05/26/19 0623)  . ceFEPime (MAXIPIME) IV Stopped (05/26/19 0556)  . metronidazole Stopped (05/26/19 0726)  . vancomycin Stopped (05/26/19 0106)     LOS: 2 days     Enzo Bi, MD [facility] If 7PM-7AM, please contact night-coverage 05/26/2019, 1:27 PM

## 2019-05-26 NOTE — Plan of Care (Signed)
VSS.  Pt denies pain. Ambulated around the nursing station x2 with 1x assist.  Tolerated well.  Encouraged pt to use the incentive spirometer during the shift.

## 2019-05-27 LAB — BASIC METABOLIC PANEL
Anion gap: 13 (ref 5–15)
BUN: 9 mg/dL (ref 8–23)
CO2: 21 mmol/L — ABNORMAL LOW (ref 22–32)
Calcium: 7.7 mg/dL — ABNORMAL LOW (ref 8.9–10.3)
Chloride: 103 mmol/L (ref 98–111)
Creatinine, Ser: 0.72 mg/dL (ref 0.44–1.00)
GFR calc Af Amer: >60 mL/min (ref >60)
GFR calc non Af Amer: >60 mL/min (ref >60)
Glucose, Bld: 105 mg/dL — ABNORMAL HIGH (ref 70–99)
Potassium: 3.1 mmol/L — ABNORMAL LOW (ref 3.5–5.1)
Sodium: 137 mmol/L (ref 135–145)

## 2019-05-27 LAB — MAGNESIUM: Magnesium: 1.8 mg/dL (ref 1.7–2.4)

## 2019-05-27 LAB — CBC
HCT: 33 % — ABNORMAL LOW (ref 36.0–46.0)
Hemoglobin: 10.4 g/dL — ABNORMAL LOW (ref 12.0–15.0)
MCH: 26.5 pg (ref 26.0–34.0)
MCHC: 31.5 g/dL (ref 30.0–36.0)
MCV: 84 fL (ref 80.0–100.0)
Platelets: 186 10*3/uL (ref 150–400)
RBC: 3.93 MIL/uL (ref 3.87–5.11)
RDW: 15 % (ref 11.5–15.5)
WBC: 7.7 10*3/uL (ref 4.0–10.5)
nRBC: 0 % (ref 0.0–0.2)

## 2019-05-27 MED ORDER — DOXYCYCLINE HYCLATE 100 MG PO TABS
100.0000 mg | ORAL_TABLET | Freq: Two times a day (BID) | ORAL | Status: DC
  Administered 2019-05-27 – 2019-05-28 (×3): 100 mg via ORAL
  Filled 2019-05-27 (×3): qty 1

## 2019-05-27 MED ORDER — POTASSIUM CHLORIDE CRYS ER 20 MEQ PO TBCR
40.0000 meq | EXTENDED_RELEASE_TABLET | Freq: Once | ORAL | Status: AC
  Administered 2019-05-27: 40 meq via ORAL
  Filled 2019-05-27: qty 2

## 2019-05-27 NOTE — Progress Notes (Signed)
Occupational Therapy Treatment Patient Details Name: Stacey Stone MRN: [id number] DOB: [birth year] Today's Date: 05/27/2019    History of present illness 74 y.o. female with medical history significant for coronary artery disease with occluded RCA, paroxysmal A. fib status post ablation 2019 with recurrence currently on Eliquis, history of breast cancer on letrozole as well as history of chronic lower extremity edema, OSA on CPAP, hypothyroidism and hypertension who presented to the ER with a several hour history of malaise associated with nausea and dry heaving and at the same time noticed pain and redness in her RLE. X-ray of tibia and fibula on 05/24/2019.   OT comments  Stacey Stone was seen for OT treatment on this date. Upon arrival to room pt was awake seated upright in chair. Daughter entered room during session. Pt A&O x 4 reporting no pain. Pt agreeable to tx. Pt and caregiver instructed in falls prevention, home/routine modifications, and energy conservation strategies. Requiring SUPERVISION only for standing grooming and toileting ADL tasks. Pt and caregiver verbalized understanding of instruction provided and have no ADL concerns and pt has excellent family support. Pt has completed goals and has no further skilled OT needs. Will D/C OT services at this time, please re-consult if change in medical status occurs. Discharge recommendation updated to reflect progress, no OT follow-up needed at this time.    Follow Up Recommendations  No OT follow up;Supervision - Intermittent    Equipment Recommendations       Recommendations for Other Services      Precautions / Restrictions Precautions Precautions: Fall Precaution Comments: mod fall Restrictions Weight Bearing Restrictions: No       Mobility Bed Mobility Overal bed mobility: Independent                Transfers Overall transfer level: Independent               General transfer comment: SUP sit<>stand  at [facility] and standard commode     Balance Overall balance assessment: Needs assistance Sitting-balance support: Feet supported Sitting balance-Leahy Scale: Good     Standing balance support: No upper extremity supported;During functional activity Standing balance-Leahy Scale: Fair Standing balance comment: Pt standing c narrow base of support during standing grooming ADL, able to reach outside BOS c no LOBs                           ADL either performed or assessed with clinical judgement   ADL Overall ADL's : Needs assistance/impaired                                       General ADL Comments: SUP don/doff slippers seated EOC. SUP toileting at regular commode. SUP tooth brushing and faces washing standing sinkside.      Vision       Perception     Praxis      Cognition Arousal/Alertness: Awake/alert Behavior During Therapy: WFL for tasks assessed/performed Overall Cognitive Status: Within Functional Limits for tasks assessed                                          Exercises Other Exercises Other Exercises: Pt and caregiver educated re: falls prevention, home/routine modifications, AE?DME recommendations, return to work, energy conservation strategies Other  Exercises: LBD, toothbrushing, facewashing, toileting at commode, sit<>stand, functional application of energy conservation strategies   Shoulder Instructions       General Comments SpO2 96% on RA at rest, decreased to 92% on RA after 10 min standing grooming ADLs. - resolved c rest    Pertinent Vitals/ Pain       Pain Assessment: No/denies pain Faces Pain Scale: Hurts a little bit Pain Location: swollen R leg Pain Descriptors / Indicators: Discomfort;Tightness Pain Intervention(s): Monitored during session  Home Living                                          Prior Functioning/Environment              Frequency           Progress  Toward Goals  OT Goals(current goals can now be found in the care plan section)  Progress towards OT goals: Goals met/education completed, patient discharged from OT  Acute Rehab OT Goals Patient Stated Goal: To return to work OT Goal Formulation: With patient Time For Goal Achievement: 06/08/19 Potential to Achieve Goals: Good ADL Goals Pt Will Perform Lower Body Dressing: with modified independence;sit to/from stand( c AE & LRAD PRN) Pt Will Transfer to Toilet: with modified independence;ambulating;regular height toilet(c LRAD PRN) Pt Will Perform Toileting - Clothing Manipulation and hygiene: with modified independence;sit to/from stand(c LRAD PRN)  Plan All goals met and education completed, patient discharged from OT services    Co-evaluation                 AM-PAC OT "6 Clicks" Daily Activity     Outcome Measure   Help from another person eating meals?: None Help from another person taking care of personal grooming?: None Help from another person toileting, which includes using toliet, bedpan, or urinal?: None Help from another person bathing (including washing, rinsing, drying)?: A Little Help from another person to put on and taking off regular upper body clothing?: None Help from another person to put on and taking off regular lower body clothing?: A Little 6 Click Score: 22    End of Session    OT Visit Diagnosis: Other abnormalities of gait and mobility (R26.89)   Activity Tolerance Patient tolerated treatment well   Patient Left in bed;with call bell/phone within reach;with family/visitor present;Other (comment)(PT in room at end of session)   Nurse Communication          Time: [phone] OT Time Calculation (min): 19 min  Charges: OT General Charges $OT Visit: 1 Visit OT Treatments $Self Care/Home Management : 8-22 mins  Stacey Stone, M.S. OTR/L  05/27/19, 10:39 AM

## 2019-05-27 NOTE — Progress Notes (Signed)
Pt c/o bilateral jaw pain, generalized headache and bilateral arm pain. Pain level 6, aching pain per pt. Per pt she gets this pain when cardiac rhythm convert to A.fib.  Pt said that at home she is taking Cardizem 30mg  for this pain.  Apical HR 142, regular. HR 145 on the Dinamap. HR did not sustain in the 140's and dropped down to 60-70.  Pain level decreased to 3.  VSS. Dr Billie Ruddy notified of the above and requested not to give Cardizem. Pt declined pain med. Tele reordered.

## 2019-05-27 NOTE — Progress Notes (Signed)
Called to restart PIV. Attempted x2 using ultrasound without success. Patient veins very small and limited due to right arm restriction. Recommend PICC for Vancomycin if continued IV. Patient would like to talk with MD before further attempts.

## 2019-05-27 NOTE — Progress Notes (Signed)
PROGRESS NOTE    Stacey Stone  [id number] DOB: June 29, 1945 DOA: 05/24/2019 PCP: Rusty Aus, MD    Assessment & Plan:   Principal Problem:   Sepsis [facility]) Active Problems:   CAD S/P percutaneous coronary angioplasty   Essential hypertension   Hypothyroidism   OSA on CPAP   Paroxysmal atrial fibrillation (HCC)   Cellulitis of right lower extremity   Bilateral edema of lower extremity    Stacey Stone is a 73 y.o. female with medical history significant for coronary artery disease with occluded RCA, paroxysmal A. fib status post ablation 2019 with recurrence currently on Eliquis, history of breast cancer on letrozole as well as history of chronic lower extremity edema, OSA on CPAP, hypothyroidism and hypertension who presents to the emergency room with a several hour history of malaise associated with nausea and dry heaving and at the same time noticed  pain and redness in her right lower extremity.   Sepsis ([place])   Cellulitis of right lower extremity, improved -sepsis criteria with fever, tachycardia, leukocytosis and lactic acid 2.9 --started on cefepime, metronidazole and vancomycin PLAN: -de-escalate to PO doxy today    CAD S/P percutaneous coronary angioplasty -No complaints of chest pain -Continue aspirin, atorvastatin, Imdur, metop    Essential hypertension -Blood pressure controlled -Continue diltiazem and metop    Hypothyroidism -Continue levothyroxine    OSA on CPAP -CPAP nightly, with home machine    Paroxysmal atrial fibrillation ([place]), with intermittent non-sustained RVR -History of ablation 2019 with recurrence -Eliquis to continue -Continue home diltiazem and metop   DVT prophylaxis: [id number] Code Status: Full code  Family Communication: updated daughter at bedside today Disposition Plan: Home likely tomorrow Monday if cellulitis improves on oral abx   Subjective and Interval History:  Pt had intermittent Afib w RVR but  not sustained.  Mostly, HR in 60's.  No fever, chest pain, abdominal pain, N/V/D, dysuria.  No BM yet, but pt didn't want bowel regimen.   Objective: Vitals:   05/27/19 1132 05/27/19 1134 05/27/19 1142 05/27/19 1617  BP:   (!) 137/100 130/72  Pulse: (!) 142 (!) 145 65 62  Resp:   18   Temp:    (!) 97.4 F (36.3 C)  TempSrc:    Oral  SpO2:   93% 98%  Weight:      Height:        Intake/Output Summary (Last 24 hours) at 05/27/2019 1817 Last data filed at 05/27/2019 1334 Gross per 24 hour  Intake 1307.55 ml  Output --  Net 1307.55 ml   Filed Weights   05/24/19 2018  Weight: 84.8 kg    Examination:   Constitutional: NAD, AAOx3 HEENT: conjunctivae and lids normal, EOMI CV: RRR no M,R,G. Distal pulses +2.  No cyanosis.   RESP: CTA B/L, normal respiratory effort  GI: +BS, NTND Extremities: Erythema receded from the previously marked boundary, less red, still more warm and swollen than left lower leg. SKIN: warm, dry and intact Neuro: II - XII grossly intact.  Sensation intact Psych: Normal mood and affect.  Appropriate judgement and reason   Data Reviewed: I have personally reviewed following labs and imaging studies  CBC: Recent Labs  Lab 05/24/19 2051 05/25/19 0518 05/26/19 0641 05/27/19 0637  WBC 18.1* 23.0* 12.1* 7.7  NEUTROABS 16.6*  --   --   --   HGB 13.9 11.8* 12.1 10.4*  HCT 43.1 38.3 38.0 33.0*  MCV 82.3 84.7 83.5 84.0  PLT 232 204 185  [id number]   Basic Metabolic Panel: Recent Labs  Lab 05/24/19 2051 05/25/19 0518 05/26/19 0641 05/27/19 0637  NA 138 140 138 137  K 3.0* 3.1* 3.5 3.1*  CL 102 110 105 103  CO2 24 23 23  21*  GLUCOSE 127* 102* 114* 105*  BUN 12 10 10 9   CREATININE 0.95 0.80 0.73 0.72  CALCIUM 9.7 8.3* 8.8* 7.7*  MG  --   --  1.8 1.8   GFR: Estimated Creatinine Clearance: 62.7 mL/min (by C-G formula based on SCr of 0.72 mg/dL). Liver Function Tests: Recent Labs  Lab 05/24/19 2051 05/25/19 0518  AST 58* 87*  ALT 36 63*  ALKPHOS  233* 186*  BILITOT 1.1 0.9  PROT 7.7 6.1*  ALBUMIN 4.0 3.0*   Recent Labs  Lab 05/24/19 2051  LIPASE 21   No results for input(s): AMMONIA in the last 168 hours. Coagulation Profile: Recent Labs  Lab 05/24/19 2051 05/25/19 0518  INR 1.1 1.2   Cardiac Enzymes: No results for input(s): CKTOTAL, CKMB, CKMBINDEX, TROPONINI in the last 168 hours. BNP (last 3 results) No results for input(s): PROBNP in the last 8760 hours. HbA1C: No results for input(s): HGBA1C in the last 72 hours. CBG: No results for input(s): GLUCAP in the last 168 hours. Lipid Profile: No results for input(s): CHOL, HDL, LDLCALC, TRIG, CHOLHDL, LDLDIRECT in the last 72 hours. Thyroid Function Tests: No results for input(s): TSH, T4TOTAL, FREET4, T3FREE, THYROIDAB in the last 72 hours. Anemia Panel: No results for input(s): VITAMINB12, FOLATE, FERRITIN, TIBC, IRON, RETICCTPCT in the last 72 hours. Sepsis Labs: Recent Labs  Lab 05/24/19 2051 05/24/19 2246 05/25/19 0518 05/25/19 0539  PROCALCITON  --   --  2.16  --   LATICACIDVEN 2.9* 2.5*  --  1.2    Recent Results (from the past 240 hour(s))  Culture, blood (routine x 2)     Status: None (Preliminary result)   Collection Time: 05/24/19  8:50 PM   Specimen: BLOOD  Result Value Ref Range Status   Specimen Description BLOOD RIGHT ARM  Final   Special Requests   Final    BOTTLES DRAWN AEROBIC AND ANAEROBIC Blood Culture results may not be optimal due to an excessive volume of blood received in culture bottles   Culture   Final    NO GROWTH 3 DAYS Performed at [facility], [address] [place], [place] [address]    Report Status PENDING  Incomplete  Urine culture     Status: None   Collection Time: 05/24/19  8:50 PM   Specimen: Urine, Random  Result Value Ref Range Status   Specimen Description   Final    URINE, RANDOM Performed at [facility], [address], [place], [place] [address]    Special Requests   Final     NONE Performed at [facility], [address], [place], [place] [address]    Culture   Final    NO GROWTH Performed at [facility], [place] [address], [place], [place] [address]    Report Status 05/25/2019 FINAL  Final  Culture, blood (routine x 2)     Status: None (Preliminary result)   Collection Time: 05/24/19  8:51 PM   Specimen: BLOOD  Result Value Ref Range Status   Specimen Description BLOOD BLOOD RIGHT HAND  Final   Special Requests   Final    BOTTLES DRAWN AEROBIC AND ANAEROBIC Blood Culture adequate volume   Culture   Final  NO GROWTH 3 DAYS Performed at [facility], [place], [place] [address]    Report Status PENDING  Incomplete  SARS CORONAVIRUS 2 (TAT 6-24 HRS) Nasopharyngeal Nasopharyngeal Swab     Status: None   Collection Time: 05/24/19  9:56 PM   Specimen: Nasopharyngeal Swab  Result Value Ref Range Status   SARS Coronavirus 2 NEGATIVE NEGATIVE Final    Comment: (NOTE) SARS-CoV-2 target nucleic acids are NOT DETECTED. The SARS-CoV-2 RNA is generally detectable in upper and lower respiratory specimens during the acute phase of infection. Negative results do not preclude SARS-CoV-2 infection, do not rule out co-infections with other pathogens, and should not be used as the sole basis for treatment or other patient management decisions. Negative results must be combined with clinical observations, patient history, and epidemiological information. The expected result is Negative. Fact Sheet for Patients: SugarRoll.be Fact Sheet for Healthcare Providers: https://www.woods-mathews.com/ This test is not yet approved or cleared by the [place] FDA and  has been authorized for detection and/or diagnosis of SARS-CoV-2 by FDA under an Emergency Use Authorization (EUA). This EUA will remain  in effect (meaning this test can be used) for the duration of the COVID-19 declaration  under Section 56 4(b)(1) of the Act, 21 U.S.C. section 360bbb-3(b)(1), unless the authorization is terminated or revoked sooner. Performed at [facility], [place] [address], [place], [place] [address]       Radiology Studies: No results found.   Scheduled Meds: . apixaban  5 mg Oral BID  . aspirin EC  81 mg Oral Daily  . atorvastatin  40 mg Oral QHS  . diltiazem  300 mg Oral Daily  . doxycycline  100 mg Oral Q12H  . isosorbide mononitrate  30 mg Oral Daily  . letrozole  2.5 mg Oral Daily  . levothyroxine  125 mcg Oral Q0600  . metoprolol succinate  50 mg Oral BID  . oxybutynin  5 mg Oral QHS  . pantoprazole  40 mg Oral Daily  . potassium chloride  10 mEq Oral BID  . sertraline  50 mg Oral QHS   Continuous Infusions: . sodium chloride Stopped (05/27/19 0616)     LOS: 3 days     Enzo Bi, MD [facility] If 7PM-7AM, please contact night-coverage 05/27/2019, 6:17 PM

## 2019-05-27 NOTE — Progress Notes (Signed)
Physical Therapy Treatment Patient Details Name: Apryle A Darrough MRN: [id number] DOB: 09/28/1945 Today's Date: 05/27/2019    History of Present Illness 74 y.o. female with medical history significant for coronary artery disease with occluded RCA, paroxysmal A. fib status post ablation 2019 with recurrence currently on Eliquis, history of breast cancer on letrozole as well as history of chronic lower extremity edema, OSA on CPAP, hypothyroidism and hypertension who presented to the ER with a several hour history of malaise associated with nausea and dry heaving and at the same time noticed pain and redness in her RLE. X-ray of tibia and fibula on 05/24/2019.    PT Comments    Patient received sitting edge of bed finishing session with OT. Patient agrees to PT session. Ambulated without AD 450 feet supervision. Ambulated up/down 4 steps with B rails and supervision. Performed standing LE strengthening/balance exercises with UE support. Patient has progressed very well with therapy and is able to continue on her own with ambulation while here.       Follow Up Recommendations  No PT follow up     Equipment Recommendations  None recommended by PT    Recommendations for Other Services       Precautions / Restrictions Precautions Precautions: Fall Precaution Comments: mod fall Restrictions Weight Bearing Restrictions: No    Mobility  Bed Mobility Overal bed mobility: Independent             General bed mobility comments: no assist needed with sit to supine  Transfers Overall transfer level: Independent               General transfer comment: no assist needed for transfers  Ambulation/Gait Ambulation/Gait assistance: Supervision Gait Distance (Feet): 400 Feet Assistive device: None   Gait velocity: decr   General Gait Details: no LOB, slight increased lateral sway.   Stairs Stairs: Yes Stairs assistance: Supervision Stair Management: Two rails;Alternating  pattern Number of Stairs: 4 General stair comments: safe on steps   Wheelchair Mobility    Modified Rankin (Stroke Patients Only)       Balance Overall balance assessment: Mild deficits observed, not formally tested Sitting-balance support: Feet supported Sitting balance-Leahy Scale: Normal     Standing balance support: No upper extremity supported Standing balance-Leahy Scale: Good Standing balance comment: Pt standing c narrow base of support during standing grooming ADL, able to reach outside BOS c no LOBs                            Cognition Arousal/Alertness: Awake/alert Behavior During Therapy: WFL for tasks assessed/performed Overall Cognitive Status: Within Functional Limits for tasks assessed                                        Exercises Other Exercises Other Exercises: Bilateral standing heel raises, hip abd, squats x 10 reps Other Exercises: LBD, toothbrushing, facewashing, toileting at commode, sit<>stand, functional application of energy conservation strategies    General Comments General comments (skin integrity, edema, etc.): SpO2 96% on RA at rest, decreased to 92% on RA after 10 min standing grooming ADLs. - resolved c rest      Pertinent Vitals/Pain Pain Assessment: No/denies pain Faces Pain Scale: Hurts a little bit Pain Location: swollen R leg Pain Descriptors / Indicators: Discomfort;Tightness Pain Intervention(s): Monitored during session    Home Living                        Prior Function            PT Goals (current goals can now be found in the care plan section) Acute Rehab PT Goals Patient Stated Goal: To return to work PT Goal Formulation: With patient Time For Goal Achievement: 06/08/19 Potential to Achieve Goals: Good Progress towards PT goals: Goals met/education completed, patient discharged from PT    Frequency    Min 2X/week      PT Plan Discharge plan needs to be updated     Co-evaluation              AM-PAC PT "6 Clicks" Mobility   Outcome Measure  Help needed turning from your back to your side while in a flat bed without using bedrails?: None Help needed moving from lying on your back to sitting on the side of a flat bed without using bedrails?: None Help needed moving to and from a bed to a chair (including a wheelchair)?: None Help needed standing up from a chair using your arms (e.g., wheelchair or bedside chair)?: None Help needed to walk in hospital room?: None Help needed climbing 3-5 steps with a railing? : None 6 Click Score: 24    End of Session   Activity Tolerance: Patient tolerated treatment well Patient left: in bed;with call bell/phone within reach;with family/visitor present Nurse Communication: Mobility status PT Visit Diagnosis: Unsteadiness on feet (R26.81);Muscle weakness (generalized) (M62.81) Pain - Right/Left: Right Pain - part of body: Leg     Time: 1010-1025 PT Time Calculation (min) (ACUTE ONLY): 15 min  Charges:  $Therapeutic Exercise: 8-22 mins                     Jodilyn Giese, PT, GCS 05/27/19,10:39 AM

## 2019-05-28 ENCOUNTER — Inpatient Hospital Stay: Payer: Self-pay

## 2019-05-28 LAB — CBC
HCT: 37.8 % (ref 36.0–46.0)
Hemoglobin: 12.1 g/dL (ref 12.0–15.0)
MCH: 26.4 pg (ref 26.0–34.0)
MCHC: 32 g/dL (ref 30.0–36.0)
MCV: 82.5 fL (ref 80.0–100.0)
Platelets: 227 10*3/uL (ref 150–400)
RBC: 4.58 MIL/uL (ref 3.87–5.11)
RDW: 14.9 % (ref 11.5–15.5)
WBC: 6.5 10*3/uL (ref 4.0–10.5)
nRBC: 0 % (ref 0.0–0.2)

## 2019-05-28 LAB — BLOOD CULTURE ID PANEL (REFLEXED)

## 2019-05-28 LAB — BASIC METABOLIC PANEL
Anion gap: 5 (ref 5–15)
BUN: 10 mg/dL (ref 8–23)
CO2: 27 mmol/L (ref 22–32)
Calcium: 8.8 mg/dL — ABNORMAL LOW (ref 8.9–10.3)
Chloride: 104 mmol/L (ref 98–111)
Creatinine, Ser: 0.63 mg/dL (ref 0.44–1.00)
GFR calc Af Amer: >60 mL/min (ref >60)
GFR calc non Af Amer: >60 mL/min (ref >60)
Glucose, Bld: 109 mg/dL — ABNORMAL HIGH (ref 70–99)
Potassium: 3.7 mmol/L (ref 3.5–5.1)
Sodium: 136 mmol/L (ref 135–145)

## 2019-05-28 LAB — MAGNESIUM: Magnesium: 2 mg/dL (ref 1.7–2.4)

## 2019-05-28 MED ORDER — SODIUM CHLORIDE 0.9 % IV SOLN
2.0000 g | Freq: Three times a day (TID) | INTRAVENOUS | Status: DC
  Filled 2019-05-28 (×2): qty 2

## 2019-05-28 MED ORDER — PIPERACILLIN-TAZOBACTAM 3.375 G IVPB
3.3750 g | Freq: Three times a day (TID) | INTRAVENOUS | Status: DC
  Administered 2019-05-28 – 2019-05-29 (×3): 3.375 g via INTRAVENOUS
  Filled 2019-05-28 (×4): qty 50

## 2019-05-28 NOTE — Progress Notes (Signed)
[facility] RN messaged pt's RN, Lauren advising that PICC team might not be able to place line until tomorrow. Advised I will update Lauren as soon as I have further information.

## 2019-05-28 NOTE — Progress Notes (Addendum)
[facility] RN updated pt's nurse, Lauren; PICC team will place PICC tomorrow. This RN will be up shortly to assess right arm with ultrasound. If able to place PIV without interfering with vessel for PICC placement, will do so.

## 2019-05-28 NOTE — Consult Note (Signed)
Infectious Disease     Reason for Consult: GNR bactremia    Referring Physician: Dr Billie Ruddy Date of Admission:  05/24/2019   Principal Problem:   Sepsis ([place]) Active Problems:   CAD S/P percutaneous coronary angioplasty   Essential hypertension   Hypothyroidism   OSA on CPAP   Paroxysmal atrial fibrillation (HCC)   Cellulitis of right lower extremity   Bilateral edema of lower extremity   HPI: Stacey Stone is a 74 y.o. female with a history of coronary artery disease hypertension A. fib on anticoagulation, chronic lower extremity edema, breast cancer, who presented to the emergency room March 4 with leg pain spreading redness and vomiting.  Of note she had initially reported a dog scratch on her right leg a week prior.  On admission she was febrile to 104.5 white count of 23,000.  Chest x-ray was negative.  X-ray of the tibia and fibula were negative.  Patient was started on cefepime metronidazole and vancomycin.  Patient defervesced and has been afebrile since admission.  White count has come down and is now 6.5.  She lost IV access on March 6 and therefore March 7 was switched to just doxycycline.  She has had marked improvement.  Last night blood cultures turned positive for gram-negative rods in 1 of 2 bottles.  Past Medical History:  Diagnosis Date  . Anginal pain ([place])   . Arthritis t   knee  . Asthma    mild  . Breast cancer ([place])   . 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 Mamie Nick. at [facility] in [place]  . Depression   . Dyspnea   . Dysrhythmia   . GERD (gastroesophageal reflux disease)   . Hypertension   . Hypothyroidism   . 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  Marland Kitchen No pertinent past medical history   . Personal history of radiation therapy   . Sleep apnea t   O2- 2l at bedtime and BiPap @ bedtime   Past Surgical History:  Procedure Laterality Date  . BREAST BIOPSY Left  2/136/2018   INVASIVE MAMMARY CARCINOMA  . BREAST BIOPSY Right 05/25/2016   FIBROCYSTIC CHANGE WITH CALCIFICATIONS   . BREAST EXCISIONAL BIOPSY Left 06/04/2016   INVASIVE MAMMARY CARCINOMA.   Marland Kitchen BREAST LUMPECTOMY Left 06/04/2016   INVASIVE MAMMARY CARCINOMA.   Marland Kitchen CARDIAC CATHETERIZATION    . CARDIAC CATHETERIZATION N/A 09/24/2015   Procedure: Left Heart Cath and Coronary Angiography;  Surgeon: Isaias Cowman, MD;  Location: [place] [facility];  Service: Cardiovascular;  Laterality: N/A;  . CARDIAC CATHETERIZATION N/A 09/24/2015   Procedure: Coronary Stent Intervention;  Surgeon: Isaias Cowman, MD;  Location: [place] [facility];  Service: Cardiovascular;  Laterality: N/A;  . CHOLECYSTECTOMY  11/26   08/1970  . DILATATION & CURETTAGE/HYSTEROSCOPY WITH MYOSURE N/A 05/05/2015   Procedure: Hysteroscopy and endometrial curretage;  Surgeon: Boykin Nearing, MD;  Location: [facility];  Service: Gynecology;  Laterality: N/A;  . DILATION AND CURETTAGE OF UTERUS    . EXCISION MASS FROM NECK  Right    Dr. Tami Ribas  . EYE SURGERY  t   bil cataract 9/08  . HAMMER TOE SURGERY Right   . JOINT REPLACEMENT Bilateral 2008, 11/26/ 2012   Partial Knee Replacements  . LEFT HEART CATH AND CORONARY ANGIOGRAPHY N/A 10/25/2017   Procedure: LEFT HEART CATH AND CORONARY ANGIOGRAPHY;  Surgeon: Teodoro Spray, MD;  Location: [place]  [facility];  Service: Cardiovascular;  Laterality: N/A;  . LEFT HEART CATH AND CORONARY ANGIOGRAPHY N/A 10/26/2017   Procedure: LEFT HEART CATH AND CORONARY ANGIOGRAPHY;  Surgeon: Isaias Cowman, MD;  Location: [facility] [facility];  Service: Cardiovascular;  Laterality: N/A;  . PARTIAL MASTECTOMY WITH NEEDLE LOCALIZATION Left 06/04/2016   Procedure: PARTIAL MASTECTOMY WITH NEEDLE LOCALIZATION;  Surgeon: Leonie Green, MD;  Location: [facility];  Service: General;  Laterality: Left;  . SCLERAL BUCKLE  02/16/2011   Procedure: SCLERAL BUCKLE;  Surgeon: Hayden Pedro,  MD;  Location: [place];  Service: Ophthalmology;  Laterality: Left;  Scleral Buckle Left Eye with Headscope Laser  . SENTINEL NODE BIOPSY Left 06/04/2016   Procedure: SENTINEL NODE BIOPSY;  Surgeon: Leonie Green, MD;  Location: [facility];  Service: General;  Laterality: Left;   Social History   Tobacco Use  . Smoking status: Never Smoker  . Smokeless tobacco: Never Used  Substance Use Topics  . Alcohol use: No  . Drug use: No   Family History  Problem Relation Age of Onset  . Breast cancer Mother 62  . Heart disease Father   . Breast cancer Paternal Aunt 73  . Breast cancer Cousin   . Anesthesia problems Neg Hx   . Hypotension Neg Hx   . Malignant hyperthermia Neg Hx   . Pseudochol deficiency Neg Hx     Allergies:  Allergies  Allergen Reactions  . Codeine Other (See Comments)    Stroke like symptoms    Current antibiotics: Antibiotics Given (last 72 hours)    Date/Time Action Medication Dose Rate   05/25/19 1510 New Bag/Given   metroNIDAZOLE (FLAGYL) IVPB 500 mg 500 mg 100 mL/hr   05/25/19 2137 New Bag/Given   ceFEPIme (MAXIPIME) 2 g in sodium chloride 0.9 % 100 mL IVPB 2 g 200 mL/hr   05/25/19 2220 New Bag/Given   metroNIDAZOLE (FLAGYL) IVPB 500 mg 500 mg 100 mL/hr   05/26/19 0006 New Bag/Given   vancomycin (VANCOCIN) IVPB 1000 mg/200 mL premix 1,000 mg 200 mL/hr   05/26/19 0522 New Bag/Given   ceFEPIme (MAXIPIME) 2 g in sodium chloride 0.9 % 100 mL IVPB 2 g 200 mL/hr   05/26/19 3614 New Bag/Given   metroNIDAZOLE (FLAGYL) IVPB 500 mg 500 mg 100 mL/hr   05/26/19 1538 New Bag/Given   metroNIDAZOLE (FLAGYL) IVPB 500 mg 500 mg 100 mL/hr   05/26/19 1651 New Bag/Given   ceFEPIme (MAXIPIME) 2 g in sodium chloride 0.9 % 100 mL IVPB 2 g 200 mL/hr   05/26/19 2125 New Bag/Given   ceFEPIme (MAXIPIME) 2 g in sodium chloride 0.9 % 100 mL IVPB 2 g 200 mL/hr   05/26/19 2200 New Bag/Given   metroNIDAZOLE (FLAGYL) IVPB 500 mg 500 mg 100 mL/hr   05/26/19 2309 New Bag/Given    vancomycin (VANCOCIN) IVPB 1000 mg/200 mL premix 1,000 mg 200 mL/hr   05/27/19 0522 New Bag/Given   ceFEPIme (MAXIPIME) 2 g in sodium chloride 0.9 % 100 mL IVPB 2 g 200 mL/hr   05/27/19 0616 New Bag/Given   metroNIDAZOLE (FLAGYL) IVPB 500 mg 500 mg 100 mL/hr   05/27/19 1446 Given   doxycycline (VIBRA-TABS) tablet 100 mg 100 mg    05/27/19 2140 Given   doxycycline (VIBRA-TABS) tablet 100 mg 100 mg    05/28/19 0900 Given   doxycycline (VIBRA-TABS) tablet 100 mg 100 mg       MEDICATIONS: . apixaban  5 mg Oral BID  . aspirin EC  81 mg Oral Daily  . atorvastatin  40 mg Oral QHS  . diltiazem  300 mg Oral Daily  . doxycycline  100 mg Oral Q12H  . isosorbide mononitrate  30 mg Oral Daily  . letrozole  2.5 mg Oral Daily  . levothyroxine  125 mcg Oral Q0600  . metoprolol succinate  50 mg Oral BID  . oxybutynin  5 mg Oral QHS  . pantoprazole  40 mg Oral Daily  . potassium chloride  10 mEq Oral BID  . sertraline  50 mg Oral QHS    Review of Systems - 11 systems reviewed and negative per HPI   OBJECTIVE: Temp:  [97.4 F (36.3 C)-98.6 F (37 C)] 98.6 F (37 C) (03/08 0805) Pulse Rate:  [60-67] 60 (03/08 0805) Resp:  [20] 20 (03/08 0805) BP: (130-155)/(72-85) 155/85 (03/08 0805) SpO2:  [96 %-100 %] 100 % (03/08 0805) Physical Exam  Constitutional:  oriented to person, place, and time. appears well-developed and well-nourished. No distress.  HENT: [place]/AT, PERRLA, no scleral icterus Mouth/Throat: Oropharynx is clear and moist. No oropharyngeal exudate.  Cardiovascular: Normal rate, regular rhythm and normal heart sounds. Exam reveals no gallop and no friction rub.  No murmur heard.  Pulmonary/Chest: Effort normal and breath sounds normal. No respiratory distress.  has no wheezes.  Neck = supple, no nuchal rigidity Abdominal: Soft. Bowel sounds are normal.  exhibits no distension. There is no tenderness.  Lymphadenopathy: no cervical adenopathy. No axillary adenopathy Neurological:  alert and oriented to person, place, and time.  Skin: R leg with mod warmth, mild swelling, black scab anteriorl.  Psychiatric: a normal mood and affect.  behavior is normal.   LABS: Results for orders placed or performed during the hospital encounter of 05/24/19 (from the past 48 hour(s))  Basic metabolic panel     Status: Abnormal   Collection Time: 05/27/19  6:37 AM  Result Value Ref Range   Sodium 137 135 - 145 mmol/L   Potassium 3.1 (L) 3.5 - 5.1 mmol/L   Chloride 103 98 - 111 mmol/L   CO2 21 (L) 22 - 32 mmol/L   Glucose, Bld 105 (H) 70 - 99 mg/dL    Comment: Glucose reference range applies only to samples taken after fasting for at least 8 hours.   BUN 9 8 - 23 mg/dL   Creatinine, Ser 0.72 0.44 - 1.00 mg/dL   Calcium 7.7 (L) 8.9 - 10.3 mg/dL   GFR calc non Af Amer >60 >60 mL/min   GFR calc Af Amer >60 >60 mL/min   Anion gap 13 5 - 15    Comment: Performed at [facility], [address] [place], [place] [address]  CBC     Status: Abnormal   Collection Time: 05/27/19  6:37 AM  Result Value Ref Range   WBC 7.7 4.0 - 10.5 K/uL   RBC 3.93 3.87 - 5.11 MIL/uL   Hemoglobin 10.4 (L) 12.0 - 15.0 g/dL   HCT 33.0 (L) 36.0 - 46.0 %   MCV 84.0 80.0 - 100.0 fL   MCH 26.5 26.0 - 34.0 pg   MCHC 31.5 30.0 - 36.0 g/dL   RDW 15.0 11.5 - 15.5 %   Platelets 186 150 - 400 K/uL   nRBC 0.0 0.0 - 0.2 %    Comment: Performed at [facility], [address], [place], [place] [address]  Magnesium     Status: None   Collection Time: 05/27/19  6:37 AM  Result Value Ref  Range   Magnesium 1.8 1.7 - 2.4 mg/dL    Comment: LIPEMIC SPECIMEN, RESULTS MAY BE AFFECTED. Performed at [facility], [address] [place], [place] [address]   Basic metabolic panel     Status: Abnormal   Collection Time: 05/28/19  5:11 AM  Result Value Ref Range   Sodium 136 135 - 145 mmol/L   Potassium 3.7 3.5 - 5.1 mmol/L   Chloride 104 98 - 111 mmol/L   CO2 27 22 - 32 mmol/L   Glucose,  Bld 109 (H) 70 - 99 mg/dL    Comment: Glucose reference range applies only to samples taken after fasting for at least 8 hours.   BUN 10 8 - 23 mg/dL   Creatinine, Ser 0.63 0.44 - 1.00 mg/dL   Calcium 8.8 (L) 8.9 - 10.3 mg/dL   GFR calc non Af Amer >60 >60 mL/min   GFR calc Af Amer >60 >60 mL/min   Anion gap 5 5 - 15    Comment: Performed at [facility], [address] [place], [place] [address]  CBC     Status: None   Collection Time: 05/28/19  5:11 AM  Result Value Ref Range   WBC 6.5 4.0 - 10.5 K/uL   RBC 4.58 3.87 - 5.11 MIL/uL   Hemoglobin 12.1 12.0 - 15.0 g/dL   HCT 37.8 36.0 - 46.0 %   MCV 82.5 80.0 - 100.0 fL   MCH 26.4 26.0 - 34.0 pg   MCHC 32.0 30.0 - 36.0 g/dL   RDW 14.9 11.5 - 15.5 %   Platelets 227 150 - 400 K/uL   nRBC 0.0 0.0 - 0.2 %    Comment: Performed at [facility] [place], [address] [place], [place] [address]  Magnesium     Status: None   Collection Time: 05/28/19  5:11 AM  Result Value Ref Range   Magnesium 2.0 1.7 - 2.4 mg/dL    Comment: Performed at [facility], [address] [place], [place] [address]   No components found for: ESR, C REACTIVE PROTEIN MICRO: Recent Results (from the past 720 hour(s))  Culture, blood (routine x 2)     Status: None (Preliminary result)   Collection Time: 05/24/19  8:50 PM   Specimen: BLOOD  Result Value Ref Range Status   Specimen Description BLOOD RIGHT ARM  Final   Special Requests   Final    BOTTLES DRAWN AEROBIC AND ANAEROBIC Blood Culture results may not be optimal due to an excessive volume of blood received in culture bottles   Culture  Setup Time   Final    Organism ID to follow ANAEROBIC BOTTLE ONLY [place] ON 05/28/19 AT 0040 [facility] Performed at [facility], [address] [place], [place] [address]    Culture GRAM NEGATIVE RODS  Final   Report Status PENDING  Incomplete  Urine culture     Status: None   Collection Time: 05/24/19  8:50 PM    Specimen: Urine, Random  Result Value Ref Range Status   Specimen Description   Final    URINE, RANDOM Performed at [facility], [address], [place], [place] [address]    Special Requests   Final    NONE Performed at [facility], [address], [place], [place] [address]    Culture   Final    NO GROWTH Performed at [facility], [place] [address], [place], [place] [address]  Report Status 05/25/2019 FINAL  Final  Blood Culture ID Panel (Reflexed)     Status: None   Collection Time: 05/24/19  8:50 PM  Result Value Ref Range Status   Enterococcus species NOT DETECTED NOT DETECTED Final   Listeria monocytogenes NOT DETECTED NOT DETECTED Final   Staphylococcus species NOT DETECTED NOT DETECTED Final   Staphylococcus aureus (BCID) NOT DETECTED NOT DETECTED Final   Streptococcus species NOT DETECTED NOT DETECTED Final   Streptococcus agalactiae NOT DETECTED NOT DETECTED Final   Streptococcus pneumoniae NOT DETECTED NOT DETECTED Final   Streptococcus pyogenes NOT DETECTED NOT DETECTED Final   Acinetobacter baumannii NOT DETECTED NOT DETECTED Final   Enterobacteriaceae species NOT DETECTED NOT DETECTED Final   Enterobacter cloacae complex NOT DETECTED NOT DETECTED Final   Escherichia coli NOT DETECTED NOT DETECTED Final   Klebsiella oxytoca NOT DETECTED NOT DETECTED Final   Klebsiella pneumoniae NOT DETECTED NOT DETECTED Final   Proteus species NOT DETECTED NOT DETECTED Final   Serratia marcescens NOT DETECTED NOT DETECTED Final   Haemophilus influenzae NOT DETECTED NOT DETECTED Final   Neisseria meningitidis NOT DETECTED NOT DETECTED Final   Pseudomonas aeruginosa NOT DETECTED NOT DETECTED Final   Candida albicans NOT DETECTED NOT DETECTED Final   Candida glabrata NOT DETECTED NOT DETECTED Final   Candida krusei NOT DETECTED NOT DETECTED Final   Candida parapsilosis NOT DETECTED NOT DETECTED Final   Candida tropicalis NOT DETECTED NOT DETECTED  Final    Comment: Performed at [facility], [address] [place], [place] [address]  Culture, blood (routine x 2)     Status: None (Preliminary result)   Collection Time: 05/24/19  8:51 PM   Specimen: BLOOD  Result Value Ref Range Status   Specimen Description BLOOD BLOOD RIGHT HAND  Final   Special Requests   Final    BOTTLES DRAWN AEROBIC AND ANAEROBIC Blood Culture adequate volume   Culture   Final    NO GROWTH 4 DAYS Performed at [facility], [address] [place], [place] [address]    Report Status PENDING  Incomplete  SARS CORONAVIRUS 2 (TAT 6-24 HRS) Nasopharyngeal Nasopharyngeal Swab     Status: None   Collection Time: 05/24/19  9:56 PM   Specimen: Nasopharyngeal Swab  Result Value Ref Range Status   SARS Coronavirus 2 NEGATIVE NEGATIVE Final    Comment: (NOTE) SARS-CoV-2 target nucleic acids are NOT DETECTED. The SARS-CoV-2 RNA is generally detectable in upper and lower respiratory specimens during the acute phase of infection. Negative results do not preclude SARS-CoV-2 infection, do not rule out co-infections with other pathogens, and should not be used as the sole basis for treatment or other patient management decisions. Negative results must be combined with clinical observations, patient history, and epidemiological information. The expected result is Negative. Fact Sheet for Patients: SugarRoll.be Fact Sheet for Healthcare Providers: https://www.woods-mathews.com/ This test is not yet approved or cleared by the [place] FDA and  has been authorized for detection and/or diagnosis of SARS-CoV-2 by FDA under an Emergency Use Authorization (EUA). This EUA will remain  in effect (meaning this test can be used) for the duration of the COVID-19 declaration under Section 56 4(b)(1) of the Act, 21 U.S.C. section 360bbb-3(b)(1), unless the authorization is terminated or revoked sooner. Performed at  [facility], [place] [address], [place],  [address]     IMAGING: DG Chest 1 View  Result Date: 05/24/2019 CLINICAL DATA:  Fevers EXAM: CHEST  1  VIEW COMPARISON:  06/06/2018 FINDINGS: Cardiac shadow is mildly enlarged but stable. Tortuous thoracic aorta is seen. The lungs are well aerated bilaterally. No bony abnormality is seen. IMPRESSION: No acute abnormality noted. Electronically Signed   By: Inez Catalina M.D.   On: 05/24/2019 21:46   DG Tibia/Fibula Right  Result Date: 05/24/2019 CLINICAL DATA:  Right leg pain and swelling, no known injury, initial encounter EXAM: RIGHT TIBIA AND FIBULA - 2 VIEW COMPARISON:  04/24/2017 FINDINGS: Partial medial joint replacement is noted similar to that seen on prior plain film examination. No acute fracture or dislocation is seen. No gross soft tissue abnormality is noted. IMPRESSION: No acute abnormality seen. Electronically Signed   By: Inez Catalina M.D.   On: 05/24/2019 21:47   MM DIAG BREAST TOMO BILATERAL  Result Date: 05/01/2019 CLINICAL DATA:  74 year old female with history left breast cancer post lumpectomy 06/04/2016. EXAM: DIGITAL DIAGNOSTIC BILATERAL MAMMOGRAM WITH CAD AND TOMO COMPARISON:  Previous exam(s). ACR Breast Density Category b: There are scattered areas of fibroglandular density. FINDINGS: No suspicious masses or calcifications are seen in either breast. Lumpectomy changes again identified within the upper-outer posterior left breast. Spot compression magnification MLO view of the left breast lumpectomy site was performed. There is no mammographic evidence of locally recurrent malignancy. Mammographic images were processed with CAD. IMPRESSION: No mammographic evidence of malignancy in either breast. RECOMMENDATION: Diagnostic mammogram is suggested in 1 year. (Code:[id number]-B-01Y) I have discussed the findings and recommendations with the patient. If applicable, a reminder letter will be sent to the patient regarding the next  appointment. BI-RADS CATEGORY  2: Benign. Electronically Signed   By: Everlean Alstrom M.D.   On: 05/01/2019 11:33    Assessment:   Stacey Stone is a 74 y.o. female recently admitted with secpis, fever to 104, related to RLE cellulitis. She now has GNR in blood culture but is clinically much improved.  Her dog did lick her leg where the scab is so may be related.  Recommendations Change to zosyn. Await culture ID for further recs.  Thank you very much for allowing me to participate in the care of this patient. Please call with questions.   Cheral Marker. Ola Spurr, MD

## 2019-05-28 NOTE — Progress Notes (Signed)
Per IV team , they may not be able to get to patient until later in evening. Will notify Dr. Billie Ruddy , and zosyn may have to be rescheduled if no PICC access before 1800 scheduled dose

## 2019-05-28 NOTE — Progress Notes (Signed)
PHARMACY - PHYSICIAN COMMUNICATION CRITICAL VALUE ALERT - BLOOD CULTURE IDENTIFICATION (BCID)  Stacey Stone is an 74 y.o. female who presented to [facility] on 05/24/2019 with Stone chief complaint of cellulitis  Assessment:  Lab reports 1 of 4 bottles w/ GNR, no BCID reported - will be sent to [facility] for additional testing  Name of physician (or Provider) Contacted: Stacey Ruiz, NP  Current antibiotics: Doxycycline  Changes to prescribed antibiotics recommended: Add Rocephin if coverage desired, pt afebrile with normal WBC at this time however Provider elected to continue to monitor pt and continue current plan pending more data, agree with plan as this may be contamination.  Results for orders placed or performed during the hospital encounter of 05/24/19  Blood Culture ID Panel (Reflexed) (Collected: 05/24/2019  8:50 PM)  Result Value Ref Range   Enterococcus species NOT DETECTED NOT DETECTED   Listeria monocytogenes NOT DETECTED NOT DETECTED   Staphylococcus species NOT DETECTED NOT DETECTED   Staphylococcus aureus (BCID) NOT DETECTED NOT DETECTED   Streptococcus species NOT DETECTED NOT DETECTED   Streptococcus agalactiae NOT DETECTED NOT DETECTED   Streptococcus pneumoniae NOT DETECTED NOT DETECTED   Streptococcus pyogenes NOT DETECTED NOT DETECTED   Acinetobacter baumannii NOT DETECTED NOT DETECTED   Enterobacteriaceae species NOT DETECTED NOT DETECTED   Enterobacter cloacae complex NOT DETECTED NOT DETECTED   Escherichia coli NOT DETECTED NOT DETECTED   Klebsiella oxytoca NOT DETECTED NOT DETECTED   Klebsiella pneumoniae NOT DETECTED NOT DETECTED   Proteus species NOT DETECTED NOT DETECTED   Serratia marcescens NOT DETECTED NOT DETECTED   Haemophilus influenzae NOT DETECTED NOT DETECTED   Neisseria meningitidis NOT DETECTED NOT DETECTED   Pseudomonas aeruginosa NOT DETECTED NOT DETECTED   Candida albicans NOT DETECTED NOT DETECTED   Candida glabrata NOT DETECTED NOT DETECTED   Candida krusei NOT DETECTED NOT DETECTED   Candida parapsilosis NOT DETECTED NOT DETECTED   Candida tropicalis NOT DETECTED NOT DETECTED    Stacey Stone 05/28/2019  1:10 AM

## 2019-05-28 NOTE — Progress Notes (Signed)
PROGRESS NOTE    Stacey Stone  [id number] DOB: 14-Sep-1945 DOA: 05/24/2019 PCP: Rusty Aus, MD    Assessment & Plan:   Principal Problem:   Sepsis [facility]) Active Problems:   CAD S/P percutaneous coronary angioplasty   Essential hypertension   Hypothyroidism   OSA on CPAP   Paroxysmal atrial fibrillation (HCC)   Cellulitis of right lower extremity   Bilateral edema of lower extremity    Stacey Stone is a 74 y.o. female with medical history significant for coronary artery disease with occluded RCA, paroxysmal A. fib status post ablation 2019 with recurrence currently on Eliquis, history of breast cancer on letrozole as well as history of chronic lower extremity edema, OSA on CPAP, hypothyroidism and hypertension who presents to the emergency room with a several hour history of malaise associated with nausea and dry heaving and at the same time noticed  pain and redness in her right lower extremity.   Sepsis ([place])   Cellulitis of right lower extremity, improved 1 of 4 blood cx bottles pos GNR -sepsis criteria with fever, tachycardia, leukocytosis and lactic acid 2.9 --started on cefepime, metronidazole and vancomycin, de-escalate to PO doxy on 3/7 in preparation for discharge. --Morning of 3/8, 1 of 4 blood cx grew GNR after 4 days. PLAN: -Repeat blood cx --ID consult --Back on broad spectrum, zosyn, per ID --Ordered PICC line due to inability of IV team to establish peripheral IV (left arm restriction due breast cancer).    CAD S/P percutaneous coronary angioplasty -No complaints of chest pain -Continue aspirin, atorvastatin, Imdur, metop    Essential hypertension -Blood pressure controlled -Continue diltiazem and metop    Hypothyroidism -Continue levothyroxine    OSA on CPAP -CPAP nightly, with home machine    Paroxysmal atrial fibrillation ([place]), with intermittent non-sustained RVR -History of ablation 2019 with recurrence -Eliquis to  continue -Continue home diltiazem and metop   DVT prophylaxis: [id number] Code Status: Full code  Family Communication: updated daughter on the phone today Disposition Plan: With the new finding of positive GNR in blood cx, will need to be inpatient for at least 2-3 more days to wait for cx speciation, and for ID to formulate final abx plans.    Subjective and Interval History:  Pt reported doing well, and her right leg redness and swelling much improved.  No fever, chest pain, abdominal pain, N/V/D, dysuria, increased swelling.  1 of 4 bottles of blood cx from presentation grew GNR after 4 days.     Objective: Vitals:   05/27/19 1617 05/27/19 2147 05/28/19 0018 05/28/19 0805  BP: 130/72 (!) 142/83 140/84 (!) 155/85  Pulse: 62 67 64 60  Resp:    20  Temp: (!) 97.4 F (36.3 C) 98.2 F (36.8 C) 97.9 F (36.6 C) 98.6 F (37 C)  TempSrc: Oral Oral Oral Oral  SpO2: 98% 97% 96% 100%  Weight:      Height:        Intake/Output Summary (Last 24 hours) at 05/28/2019 1628 Last data filed at 05/28/2019 1348 Gross per 24 hour  Intake 840 ml  Output 1 ml  Net 839 ml   Filed Weights   05/24/19 2018  Weight: 84.8 kg    Examination:   Constitutional: NAD, AAOx3 HEENT: conjunctivae and lids normal, EOMI CV: RRR no M,R,G. Distal pulses +2.  No cyanosis.   RESP: CTA B/L, normal respiratory effort  GI: +BS, NTND Extremities: Erythema receded from the previously marked boundary, erythema and edema  improved from yesterday. SKIN: warm, dry and intact Neuro: II - XII grossly intact.  Sensation intact Psych: Normal mood and affect.  Appropriate judgement and reason   Data Reviewed: I have personally reviewed following labs and imaging studies  CBC: Recent Labs  Lab 05/24/19 2051 05/25/19 0518 05/26/19 0641 05/27/19 0637 05/28/19 0511  WBC 18.1* 23.0* 12.1* 7.7 6.5  NEUTROABS 16.6*  --   --   --   --   HGB 13.9 11.8* 12.1 10.4* 12.1  HCT 43.1 38.3 38.0 33.0* 37.8  MCV 82.3  84.7 83.5 84.0 82.5  PLT 232 204 185 186 [id number]   Basic Metabolic Panel: Recent Labs  Lab 05/24/19 2051 05/25/19 0518 05/26/19 0641 05/27/19 0637 05/28/19 0511  NA 138 140 138 137 136  K 3.0* 3.1* 3.5 3.1* 3.7  CL 102 110 105 103 104  CO2 24 23 23  21* 27  GLUCOSE 127* 102* 114* 105* 109*  BUN 12 10 10 9 10   CREATININE 0.95 0.80 0.73 0.72 0.63  CALCIUM 9.7 8.3* 8.8* 7.7* 8.8*  MG  --   --  1.8 1.8 2.0   GFR: Estimated Creatinine Clearance: 62.7 mL/min (by C-G formula based on SCr of 0.63 mg/dL). Liver Function Tests: Recent Labs  Lab 05/24/19 2051 05/25/19 0518  AST 58* 87*  ALT 36 63*  ALKPHOS 233* 186*  BILITOT 1.1 0.9  PROT 7.7 6.1*  ALBUMIN 4.0 3.0*   Recent Labs  Lab 05/24/19 2051  LIPASE 21   No results for input(s): AMMONIA in the last 168 hours. Coagulation Profile: Recent Labs  Lab 05/24/19 2051 05/25/19 0518  INR 1.1 1.2   Cardiac Enzymes: No results for input(s): CKTOTAL, CKMB, CKMBINDEX, TROPONINI in the last 168 hours. BNP (last 3 results) No results for input(s): PROBNP in the last 8760 hours. HbA1C: No results for input(s): HGBA1C in the last 72 hours. CBG: No results for input(s): GLUCAP in the last 168 hours. Lipid Profile: No results for input(s): CHOL, HDL, LDLCALC, TRIG, CHOLHDL, LDLDIRECT in the last 72 hours. Thyroid Function Tests: No results for input(s): TSH, T4TOTAL, FREET4, T3FREE, THYROIDAB in the last 72 hours. Anemia Panel: No results for input(s): VITAMINB12, FOLATE, FERRITIN, TIBC, IRON, RETICCTPCT in the last 72 hours. Sepsis Labs: Recent Labs  Lab 05/24/19 2051 05/24/19 2246 05/25/19 0518 05/25/19 0539  PROCALCITON  --   --  2.16  --   LATICACIDVEN 2.9* 2.5*  --  1.2    Recent Results (from the past 240 hour(s))  Culture, blood (routine x 2)     Status: None (Preliminary result)   Collection Time: 05/24/19  8:50 PM   Specimen: BLOOD  Result Value Ref Range Status   Specimen Description BLOOD RIGHT ARM  Final     Special Requests   Final    BOTTLES DRAWN AEROBIC AND ANAEROBIC Blood Culture results may not be optimal due to an excessive volume of blood received in culture bottles   Culture  Setup Time   Final    Organism ID to follow ANAEROBIC BOTTLE ONLY [place] ON 05/28/19 AT 0040 [facility] Performed at [facility], [address], [place], [place] [address]    Culture GRAM NEGATIVE RODS  Final   Report Status PENDING  Incomplete  Urine culture     Status: None   Collection Time: 05/24/19  8:50 PM   Specimen: Urine, Random  Result Value Ref Range Status   Specimen Description   Final  URINE, RANDOM Performed at [facility], [address], [place], [place] [address]    Special Requests   Final    NONE Performed at [facility], [address], [place], [place] [address]    Culture   Final    NO GROWTH Performed at [facility], [place] [address] [place], [place] [address]    Report Status 05/25/2019 FINAL  Final  Blood Culture ID Panel (Reflexed)     Status: None   Collection Time: 05/24/19  8:50 PM  Result Value Ref Range Status   Enterococcus species NOT DETECTED NOT DETECTED Final   Listeria monocytogenes NOT DETECTED NOT DETECTED Final   Staphylococcus species NOT DETECTED NOT DETECTED Final   Staphylococcus aureus (BCID) NOT DETECTED NOT DETECTED Final   Streptococcus species NOT DETECTED NOT DETECTED Final   Streptococcus agalactiae NOT DETECTED NOT DETECTED Final   Streptococcus pneumoniae NOT DETECTED NOT DETECTED Final   Streptococcus pyogenes NOT DETECTED NOT DETECTED Final   Acinetobacter baumannii NOT DETECTED NOT DETECTED Final   Enterobacteriaceae species NOT DETECTED NOT DETECTED Final   Enterobacter cloacae complex NOT DETECTED NOT DETECTED Final   Escherichia coli NOT DETECTED NOT DETECTED Final   Klebsiella oxytoca NOT DETECTED NOT DETECTED Final   Klebsiella pneumoniae NOT DETECTED NOT DETECTED Final    Proteus species NOT DETECTED NOT DETECTED Final   Serratia marcescens NOT DETECTED NOT DETECTED Final   Haemophilus influenzae NOT DETECTED NOT DETECTED Final   Neisseria meningitidis NOT DETECTED NOT DETECTED Final   Pseudomonas aeruginosa NOT DETECTED NOT DETECTED Final   Candida albicans NOT DETECTED NOT DETECTED Final   Candida glabrata NOT DETECTED NOT DETECTED Final   Candida krusei NOT DETECTED NOT DETECTED Final   Candida parapsilosis NOT DETECTED NOT DETECTED Final   Candida tropicalis NOT DETECTED NOT DETECTED Final    Comment: Performed at [facility], [address] [place], [place] [address]  Culture, blood (routine x 2)     Status: None (Preliminary result)   Collection Time: 05/24/19  8:51 PM   Specimen: BLOOD  Result Value Ref Range Status   Specimen Description BLOOD BLOOD RIGHT HAND  Final   Special Requests   Final    BOTTLES DRAWN AEROBIC AND ANAEROBIC Blood Culture adequate volume   Culture   Final    NO GROWTH 4 DAYS Performed at [facility], [address] [place], [place] [address]    Report Status PENDING  Incomplete  SARS CORONAVIRUS 2 (TAT 6-24 HRS) Nasopharyngeal Nasopharyngeal Swab     Status: None   Collection Time: 05/24/19  9:56 PM   Specimen: Nasopharyngeal Swab  Result Value Ref Range Status   SARS Coronavirus 2 NEGATIVE NEGATIVE Final    Comment: (NOTE) SARS-CoV-2 target nucleic acids are NOT DETECTED. The SARS-CoV-2 RNA is generally detectable in upper and lower respiratory specimens during the acute phase of infection. Negative results do not preclude SARS-CoV-2 infection, do not rule out co-infections with other pathogens, and should not be used as the sole basis for treatment or other patient management decisions. Negative results must be combined with clinical observations, patient history, and epidemiological information. The expected result is Negative. Fact Sheet for  Patients: SugarRoll.be Fact Sheet for Healthcare Providers: https://www.woods-mathews.com/ This test is not yet approved or cleared by the [place] FDA and  has been authorized for detection and/or diagnosis of SARS-CoV-2 by FDA under an Emergency Use Authorization (EUA). This EUA will remain  in effect (  meaning this test can be used) for the duration of the COVID-19 declaration under Section 56 4(b)(1) of the Act, 21 U.S.C. section 360bbb-3(b)(1), unless the authorization is terminated or revoked sooner. Performed at [facility], [place] [address] [place], [place] [address]       Radiology Studies: [place] EKG SITE RITE  Result Date: 05/28/2019 If Site Rite image not attached, placement could not be confirmed due to current cardiac rhythm.    Scheduled Meds: . apixaban  5 mg Oral BID  . aspirin EC  81 mg Oral Daily  . atorvastatin  40 mg Oral QHS  . diltiazem  300 mg Oral Daily  . isosorbide mononitrate  30 mg Oral Daily  . letrozole  2.5 mg Oral Daily  . levothyroxine  125 mcg Oral Q0600  . metoprolol succinate  50 mg Oral BID  . oxybutynin  5 mg Oral QHS  . pantoprazole  40 mg Oral Daily  . potassium chloride  10 mEq Oral BID  . sertraline  50 mg Oral QHS   Continuous Infusions: . sodium chloride Stopped (05/27/19 0616)  . piperacillin-tazobactam (ZOSYN)  IV       LOS: 4 days     Enzo Bi, MD [facility] If 7PM-7AM, please contact night-coverage 05/28/2019, 4:28 PM

## 2019-05-29 LAB — CULTURE, BLOOD (ROUTINE X 2)
Culture: NO GROWTH
Special Requests: ADEQUATE

## 2019-05-29 LAB — CBC
HCT: 41.3 % (ref 36.0–46.0)
Hemoglobin: 13.2 g/dL (ref 12.0–15.0)
MCH: 26.3 pg (ref 26.0–34.0)
MCHC: 32 g/dL (ref 30.0–36.0)
MCV: 82.3 fL (ref 80.0–100.0)
Platelets: 286 10*3/uL (ref 150–400)
RBC: 5.02 MIL/uL (ref 3.87–5.11)
RDW: 15 % (ref 11.5–15.5)
WBC: 9 10*3/uL (ref 4.0–10.5)
nRBC: 0 % (ref 0.0–0.2)

## 2019-05-29 LAB — BASIC METABOLIC PANEL
Anion gap: 9 (ref 5–15)
BUN: 10 mg/dL (ref 8–23)
CO2: 24 mmol/L (ref 22–32)
Calcium: 9.7 mg/dL (ref 8.9–10.3)
Chloride: 104 mmol/L (ref 98–111)
Creatinine, Ser: 0.71 mg/dL (ref 0.44–1.00)
GFR calc Af Amer: >60 mL/min (ref >60)
GFR calc non Af Amer: >60 mL/min (ref >60)
Glucose, Bld: 114 mg/dL — ABNORMAL HIGH (ref 70–99)
Potassium: 3.7 mmol/L (ref 3.5–5.1)
Sodium: 137 mmol/L (ref 135–145)

## 2019-05-29 LAB — MAGNESIUM: Magnesium: 2.1 mg/dL (ref 1.7–2.4)

## 2019-05-29 MED ORDER — AMOXICILLIN-POT CLAVULANATE 875-125 MG PO TABS: 1.0000 | ORAL_TABLET | Freq: Two times a day (BID) | ORAL | 0 refills | Status: AC

## 2019-05-29 MED ORDER — ATORVASTATIN CALCIUM 40 MG PO TABS: 40.0000 mg | ORAL_TABLET | Freq: Every day | ORAL | Status: DC

## 2019-05-29 MED ORDER — POTASSIUM CHLORIDE ER 10 MEQ PO TBCR: 10.0000 meq | EXTENDED_RELEASE_TABLET | Freq: Two times a day (BID) | ORAL | Status: DC

## 2019-05-29 MED ORDER — LETROZOLE 2.5 MG PO TABS: 2.5000 mg | ORAL_TABLET | Freq: Every day | ORAL | Status: DC

## 2019-05-29 NOTE — Progress Notes (Signed)
received Md order to discharge patient to home, reviewed home meds, prescriptions,  discharge instructions and follow up appointments with patient and patient verbalized understanding

## 2019-05-29 NOTE — Discharge Summary (Signed)
Physician Discharge Summary   ARIANNI Stone  female DOB: 03-Oct-1945  [id number]  PCP: Rusty Aus, MD  Admit date: 05/24/2019 Discharge date: 05/29/2019  Admitted From: home Disposition:  home CODE STATUS: Full code  Discharge Instructions    Diet - low sodium heart healthy   Complete by: As directed    Discharge instructions   Complete by: As directed    Your ID doctor Dr. Ola Spurr said you can go home with 7 more days of Augmentin twice a day.  You will follow up with him in clinic on Thursday.   Dr. Enzo Bi - -   Increase activity slowly   Complete by: As directed        Hospital Course:  For full details, please see H&P, progress notes, consult notes and ancillary notes.  Briefly,  Stacey A Harrelsonis a 74 y.o.femalewith medical history significant forcoronary artery disease with occluded RCA, paroxysmal A. fib status post ablation 2019 with recurrence currently on Eliquis, history of breast cancer on letrozole as well as history of chronic lower extremity edema, OSA on CPAP, hypothyroidism and hypertension who presents to the emergency room with a several hour history ofmalaise associated with nausea and dry heaving and at the same time noticedpain and redness in her right lower extremity.   Sepsis ([place]) Cellulitis of right lower extremity, improved 1 of 4 blood cx bottles pos GNR Sepsis criteria with fever, tachycardia, leukocytosis and lactic acid 2.9.  Initially, sepsis presumed 2/2 cellulitis.  Pt was started on cefepime, metronidazole and vancomycin with slower than expected improvement in the erythema, edema and warmth of the cellulitis, however, cellulitis did improve eventually, so abx was de-escalated to PO doxy on 3/7 in preparation for discharge.  Morning of 3/8, 1 of 4 blood cx obtained on presentation grew GNR after 4 days.  ID was consulted who put pt back on zosyn briefly, and since GNR likely anaerobe, ID rec discharge home with  Augmentin BID for 7 more days.  Pt will follow up with ID 2 days after discharge.  CAD S/P percutaneous coronary angioplasty No complaints of chest pain.  Continued aspirin, atorvastatin, Imdur, metop  Essential hypertension Blood pressure controlled.  Continued home diltiazem and metop  Hypothyroidism Continued levothyroxine  OSA on CPAP CPAP nightly, with home machine  Paroxysmal atrial fibrillation ([place]), with intermittent non-sustained RVR History of ablation 2019 with recurrence.  Continued Eliquis.  Continued home diltiazem and metop.  Pt had intermittent RVR, however, nonsustained, and since baseline HR around 60's, pt was not ordered additional rate-control meds.   Discharge Diagnoses:  Principal Problem:   Sepsis ([place]) Active Problems:   CAD S/P percutaneous coronary angioplasty   Essential hypertension   Hypothyroidism   OSA on CPAP   Paroxysmal atrial fibrillation ([facility])   Cellulitis of right lower extremity   Bilateral edema of lower extremity    Discharge Instructions:  Allergies as of 05/29/2019      Reactions   Codeine Other (See Comments)   Stroke like symptoms      Medication List    TAKE these medications   acidophilus Caps capsule Take 1 capsule by mouth daily.   amoxicillin-clavulanate 875-125 MG tablet Commonly known as: Augmentin Take 1 tablet by mouth 2 (two) times daily for 7 days. Antibiotic.   aspirin 81 MG EC tablet Take 81 mg by mouth daily.   atorvastatin 40 MG tablet Commonly known as: LIPITOR Take 1 tablet (40 mg total) by mouth at bedtime.  diltiazem 30 MG tablet Commonly known as: CARDIZEM Take 30 mg by mouth 2 (two) times daily as needed (for breakthrough afib).   diltiazem 300 MG 24 hr capsule Commonly known as: CARDIZEM CD Take 300 mg by mouth daily.   Eliquis 5 MG Tabs tablet Generic drug: apixaban Take 5 mg by mouth 2 (two) times daily.   Fluticasone-Salmeterol 100-50 MCG/DOSE Aepb Commonly known  as: ADVAIR Inhale 1 puff into the lungs 2 (two) times daily as needed (for asthma.).   isosorbide mononitrate 30 MG 24 hr tablet Commonly known as: IMDUR Take 30 mg by mouth daily.   letrozole 2.5 MG tablet Commonly known as: FEMARA Take 1 tablet (2.5 mg total) by mouth daily.   levothyroxine 125 MCG tablet Commonly known as: SYNTHROID Take 125 mcg by mouth daily.   metoprolol succinate 100 MG 24 hr tablet Commonly known as: TOPROL-XL Take 50 mg by mouth 2 (two) times daily.   nitroGLYCERIN 0.4 MG SL tablet Commonly known as: NITROSTAT Place 0.4 mg under the tongue every 5 (five) minutes as needed for chest pain. Maximum 3 doses, If no relief call md or 911.   oxybutynin 5 MG tablet Commonly known as: DITROPAN Take 5 mg by mouth at bedtime.   potassium chloride 10 MEQ tablet Commonly known as: KLOR-CON Take 1 tablet (10 mEq total) by mouth 2 (two) times daily.   RABEprazole 20 MG tablet Commonly known as: ACIPHEX Take 20 mg by mouth daily.   sertraline 50 MG tablet Commonly known as: ZOLOFT Take 50 mg by mouth at bedtime.   torsemide 20 MG tablet Commonly known as: DEMADEX Take 10 mg by mouth as directed. Take on Tuesday and Friday       Follow-up Information    Leonel Ramsay, MD. Schedule an appointment as soon as possible for a visit on 05/31/2019.   Specialty: Infectious Diseases Why: follow up  Contact information: [place] [address] [phone]        Rusty Aus, MD. Schedule an appointment as soon as possible for a visit in 1 week(s).   Specialty: Internal Medicine Contact information: [place] [address] [phone]           Allergies  Allergen Reactions  . Codeine Other (See Comments)    Stroke like symptoms     The results of significant diagnostics from this hospitalization (including imaging, microbiology, ancillary and laboratory) are  listed below for reference.   Consultations:   Procedures/Studies: DG Chest 1 View  Result Date: 05/24/2019 CLINICAL DATA:  Fevers EXAM: CHEST  1 VIEW COMPARISON:  06/06/2018 FINDINGS: Cardiac shadow is mildly enlarged but stable. Tortuous thoracic aorta is seen. The lungs are well aerated bilaterally. No bony abnormality is seen. IMPRESSION: No acute abnormality noted. Electronically Signed   By: Inez Catalina M.D.   On: 05/24/2019 21:46   DG Tibia/Fibula Right  Result Date: 05/24/2019 CLINICAL DATA:  Right leg pain and swelling, no known injury, initial encounter EXAM: RIGHT TIBIA AND FIBULA - 2 VIEW COMPARISON:  04/24/2017 FINDINGS: Partial medial joint replacement is noted similar to that seen on prior plain film examination. No acute fracture or dislocation is seen. No gross soft tissue abnormality is noted. IMPRESSION: No acute abnormality seen. Electronically Signed   By: Inez Catalina M.D.   On: 05/24/2019 21:47   MM DIAG BREAST TOMO BILATERAL  Result Date: 05/01/2019 CLINICAL DATA:  74 year old female with history  left breast cancer post lumpectomy 06/04/2016. EXAM: DIGITAL DIAGNOSTIC BILATERAL MAMMOGRAM WITH CAD AND TOMO COMPARISON:  Previous exam(s). ACR Breast Density Category b: There are scattered areas of fibroglandular density. FINDINGS: No suspicious masses or calcifications are seen in either breast. Lumpectomy changes again identified within the upper-outer posterior left breast. Spot compression magnification MLO view of the left breast lumpectomy site was performed. There is no mammographic evidence of locally recurrent malignancy. Mammographic images were processed with CAD. IMPRESSION: No mammographic evidence of malignancy in either breast. RECOMMENDATION: Diagnostic mammogram is suggested in 1 year. (Code:[id number]) I have discussed the findings and recommendations with the patient. If applicable, a reminder letter will be sent to the patient regarding the next appointment. BI-RADS  CATEGORY  2: Benign. Electronically Signed   By: Everlean Alstrom M.D.   On: 05/01/2019 11:33   [place] EKG SITE RITE  Result Date: 05/28/2019 If Site Rite image not attached, placement could not be confirmed due to current cardiac rhythm.     Labs: BNP (last 3 results) No results for input(s): BNP in the last 8760 hours. Basic Metabolic Panel: Recent Labs  Lab 05/25/19 0518 05/26/19 0641 05/27/19 0637 05/28/19 0511 05/29/19 0406  NA 140 138 137 136 137  K 3.1* 3.5 3.1* 3.7 3.7  CL 110 105 103 104 104  CO2 23 23 21* 27 24  GLUCOSE 102* 114* 105* 109* 114*  BUN 10 10 9 10 10   CREATININE 0.80 0.73 0.72 0.63 0.71  CALCIUM 8.3* 8.8* 7.7* 8.8* 9.7  MG  --  1.8 1.8 2.0 2.1   Liver Function Tests: Recent Labs  Lab 05/24/19 2051 05/25/19 0518  AST 58* 87*  ALT 36 63*  ALKPHOS 233* 186*  BILITOT 1.1 0.9  PROT 7.7 6.1*  ALBUMIN 4.0 3.0*   Recent Labs  Lab 05/24/19 2051  LIPASE 21   No results for input(s): AMMONIA in the last 168 hours. CBC: Recent Labs  Lab 05/24/19 2051 05/24/19 2051 05/25/19 0518 05/26/19 0641 05/27/19 0637 05/28/19 0511 05/29/19 0406  WBC 18.1*   < > 23.0* 12.1* 7.7 6.5 9.0  NEUTROABS 16.6*  --   --   --   --   --   --   HGB 13.9   < > 11.8* 12.1 10.4* 12.1 13.2  HCT 43.1   < > 38.3 38.0 33.0* 37.8 41.3  MCV 82.3   < > 84.7 83.5 84.0 82.5 82.3  PLT 232   < > 204 185 186 227 286   < > = values in this interval not displayed.   Cardiac Enzymes: No results for input(s): CKTOTAL, CKMB, CKMBINDEX, TROPONINI in the last 168 hours. BNP: Invalid input(s): POCBNP CBG: No results for input(s): GLUCAP in the last 168 hours. D-Dimer No results for input(s): DDIMER in the last 72 hours. Hgb A1c No results for input(s): HGBA1C in the last 72 hours. Lipid Profile No results for input(s): CHOL, HDL, LDLCALC, TRIG, CHOLHDL, LDLDIRECT in the last 72 hours. Thyroid function studies No results for input(s): TSH, T4TOTAL, T3FREE, THYROIDAB in the last 72  hours.  Invalid input(s): FREET3 Anemia work up No results for input(s): VITAMINB12, FOLATE, FERRITIN, TIBC, IRON, RETICCTPCT in the last 72 hours. Urinalysis    Component Value Date/Time   COLORURINE YELLOW (A) 05/24/2019 2051   APPEARANCEUR CLEAR (A) 05/24/2019 2051   LABSPEC 1.010 05/24/2019 2051   PHURINE 8.0 05/24/2019 2051   GLUCOSEU NEGATIVE 05/24/2019 2051   HGBUR SMALL (A) 05/24/2019 2051  [place] NEGATIVE 05/24/2019 2051   [place] NEGATIVE 05/24/2019 2051   PROTEINUR NEGATIVE 05/24/2019 2051   NITRITE NEGATIVE 05/24/2019 2051   LEUKOCYTESUR NEGATIVE 05/24/2019 2051   Sepsis Labs Invalid input(s): PROCALCITONIN,  WBC,  LACTICIDVEN Microbiology Recent Results (from the past 240 hour(s))  Culture, blood (routine x 2)     Status: None (Preliminary result)   Collection Time: 05/24/19  8:50 PM   Specimen: BLOOD  Result Value Ref Range Status   Specimen Description   Final    BLOOD RIGHT ARM Performed at [facility], [address], [place], [place] [address]    Special Requests   Final    BOTTLES DRAWN AEROBIC AND ANAEROBIC Blood Culture results may not be optimal due to an excessive volume of blood received in culture bottles Performed at [facility], [address] [place], [place] [address]    Culture  Setup Time   Final    ANAEROBIC BOTTLE ONLY GRAM NEGATIVE RODS [place] ON 05/28/19 AT 0040 [facility] Performed at [facility], [place] [address], [place], [place] [address]    Culture GRAM NEGATIVE RODS  Final   Report Status PENDING  Incomplete  Urine culture     Status: None   Collection Time: 05/24/19  8:50 PM   Specimen: Urine, Random  Result Value Ref Range Status   Specimen Description   Final    URINE, RANDOM Performed at [facility], [address], [place], [place] [address]    Special Requests   Final    NONE Performed at [facility], [address] [place], [place] [address]    Culture   Final      NO GROWTH Performed at [facility], [place] [address], [place], [place] [address]    Report Status 05/25/2019 FINAL  Final  Blood Culture ID Panel (Reflexed)     Status: None   Collection Time: 05/24/19  8:50 PM  Result Value Ref Range Status   Enterococcus species NOT DETECTED NOT DETECTED Final   Listeria monocytogenes NOT DETECTED NOT DETECTED Final   Staphylococcus species NOT DETECTED NOT DETECTED Final   Staphylococcus aureus (BCID) NOT DETECTED NOT DETECTED Final   Streptococcus species NOT DETECTED NOT DETECTED Final   Streptococcus agalactiae NOT DETECTED NOT DETECTED Final   Streptococcus pneumoniae NOT DETECTED NOT DETECTED Final   Streptococcus pyogenes NOT DETECTED NOT DETECTED Final   Acinetobacter baumannii NOT DETECTED NOT DETECTED Final   Enterobacteriaceae species NOT DETECTED NOT DETECTED Final   Enterobacter cloacae complex NOT DETECTED NOT DETECTED Final   Escherichia coli NOT DETECTED NOT DETECTED Final   Klebsiella oxytoca NOT DETECTED NOT DETECTED Final   Klebsiella pneumoniae NOT DETECTED NOT DETECTED Final   Proteus species NOT DETECTED NOT DETECTED Final   Serratia marcescens NOT DETECTED NOT DETECTED Final   Haemophilus influenzae NOT DETECTED NOT DETECTED Final   Neisseria meningitidis NOT DETECTED NOT DETECTED Final   Pseudomonas aeruginosa NOT DETECTED NOT DETECTED Final   Candida albicans NOT DETECTED NOT DETECTED Final   Candida glabrata NOT DETECTED NOT DETECTED Final   Candida krusei NOT DETECTED NOT DETECTED Final   Candida parapsilosis NOT DETECTED NOT DETECTED Final   Candida tropicalis NOT DETECTED NOT DETECTED Final    Comment: Performed at [facility], [address] [place], [place] [address]  Culture, blood (routine x 2)     Status: None   Collection Time: 05/24/19  8:51 PM   Specimen:  BLOOD  Result Value Ref Range Status   Specimen Description BLOOD BLOOD RIGHT HAND  Final   Special Requests   Final    BOTTLES DRAWN  AEROBIC AND ANAEROBIC Blood Culture adequate volume   Culture   Final    NO GROWTH 5 DAYS Performed at [facility], [address], [place], [place] [address]    Report Status 05/29/2019 FINAL  Final  SARS CORONAVIRUS 2 (TAT 6-24 HRS) Nasopharyngeal Nasopharyngeal Swab     Status: None   Collection Time: 05/24/19  9:56 PM   Specimen: Nasopharyngeal Swab  Result Value Ref Range Status   SARS Coronavirus 2 NEGATIVE NEGATIVE Final    Comment: (NOTE) SARS-CoV-2 target nucleic acids are NOT DETECTED. The SARS-CoV-2 RNA is generally detectable in upper and lower respiratory specimens during the acute phase of infection. Negative results do not preclude SARS-CoV-2 infection, do not rule out co-infections with other pathogens, and should not be used as the sole basis for treatment or other patient management decisions. Negative results must be combined with clinical observations, patient history, and epidemiological information. The expected result is Negative. Fact Sheet for Patients: SugarRoll.be Fact Sheet for Healthcare Providers: https://www.woods-mathews.com/ This test is not yet approved or cleared by the [place] FDA and  has been authorized for detection and/or diagnosis of SARS-CoV-2 by FDA under an Emergency Use Authorization (EUA). This EUA will remain  in effect (meaning this test can be used) for the duration of the COVID-19 declaration under Section 56 4(b)(1) of the Act, 21 U.S.C. section 360bbb-3(b)(1), unless the authorization is terminated or revoked sooner. Performed at [facility], [place] [address] [place], [place] [address]   Culture, blood (Routine X 2) w Reflex to ID Panel     Status: None (Preliminary result)   Collection Time: 05/28/19  3:30 PM   Specimen: BLOOD  Result Value Ref Range Status   Specimen Description BLOOD RIGHT ANTECUBITAL  Final   Special Requests   Final    BOTTLES DRAWN AEROBIC  AND ANAEROBIC Blood Culture adequate volume   Culture   Final    NO GROWTH < 24 HOURS Performed at [facility], [address], [place], [place] [address]    Report Status PENDING  Incomplete  Culture, blood (Routine X 2) w Reflex to ID Panel     Status: None (Preliminary result)   Collection Time: 05/28/19  3:35 PM   Specimen: BLOOD  Result Value Ref Range Status   Specimen Description BLOOD BLOOD RIGHT HAND  Final   Special Requests   Final    BOTTLES DRAWN AEROBIC AND ANAEROBIC Blood Culture adequate volume   Culture   Final    NO GROWTH < 24 HOURS Performed at [facility], [address], [place], [place] [address]    Report Status PENDING  Incomplete     Total time spend on discharging this patient, including the last patient exam, discussing the hospital stay, instructions for ongoing care as it relates to all pertinent caregivers, as well as preparing the medical discharge records, prescriptions, and/or referrals as applicable, is 40 minutes.    Enzo Bi, MD  [facility] 05/29/2019, 2:47 PM  If 7PM-7AM, please contact night-coverage

## 2019-05-29 NOTE — Progress Notes (Signed)
[place] INFECTIOUS DISEASE PROGRESS NOTE Date of Admission:  05/24/2019     ID: Stacey Stone is a 74 y.o. female with  GNR bacteremia, cellulitis Principal Problem:   Sepsis ([place]) Active Problems:   CAD S/P percutaneous coronary angioplasty   Essential hypertension   Hypothyroidism   OSA on CPAP   Paroxysmal atrial fibrillation (HCC)   Cellulitis of right lower extremity   Bilateral edema of lower extremity   Subjective: Doing well, no fevers, wbc down. Leg pain resolved  ROS  Eleven systems are reviewed and negative except per hpi  Medications:  Antibiotics Given (last 72 hours)    Date/Time Action Medication Dose Rate   05/26/19 1538 New Bag/Given   metroNIDAZOLE (FLAGYL) IVPB 500 mg 500 mg 100 mL/hr   05/26/19 1651 New Bag/Given   ceFEPIme (MAXIPIME) 2 g in sodium chloride 0.9 % 100 mL IVPB 2 g 200 mL/hr   05/26/19 2125 New Bag/Given   ceFEPIme (MAXIPIME) 2 g in sodium chloride 0.9 % 100 mL IVPB 2 g 200 mL/hr   05/26/19 2200 New Bag/Given   metroNIDAZOLE (FLAGYL) IVPB 500 mg 500 mg 100 mL/hr   05/26/19 2309 New Bag/Given   vancomycin (VANCOCIN) IVPB 1000 mg/200 mL premix 1,000 mg 200 mL/hr   05/27/19 0522 New Bag/Given   ceFEPIme (MAXIPIME) 2 g in sodium chloride 0.9 % 100 mL IVPB 2 g 200 mL/hr   05/27/19 0616 New Bag/Given   metroNIDAZOLE (FLAGYL) IVPB 500 mg 500 mg 100 mL/hr   05/27/19 1446 Given   doxycycline (VIBRA-TABS) tablet 100 mg 100 mg    05/27/19 2140 Given   doxycycline (VIBRA-TABS) tablet 100 mg 100 mg    05/28/19 0900 Given   doxycycline (VIBRA-TABS) tablet 100 mg 100 mg    05/28/19 1756 New Bag/Given   piperacillin-tazobactam (ZOSYN) IVPB 3.375 g 3.375 g 12.5 mL/hr   05/29/19 0740 New Bag/Given   piperacillin-tazobactam (ZOSYN) IVPB 3.375 g 3.375 g 12.5 mL/hr     . apixaban  5 mg Oral BID  . aspirin EC  81 mg Oral Daily  . atorvastatin  40 mg Oral QHS  . diltiazem  300 mg Oral Daily  . isosorbide mononitrate  30 mg Oral Daily  .  letrozole  2.5 mg Oral Daily  . levothyroxine  125 mcg Oral Q0600  . metoprolol succinate  50 mg Oral BID  . oxybutynin  5 mg Oral QHS  . pantoprazole  40 mg Oral Daily  . potassium chloride  10 mEq Oral BID  . sertraline  50 mg Oral QHS    Objective: Vital signs in last 24 hours: Temp:  [98.1 F (36.7 C)-98.3 F (36.8 C)] 98.1 F (36.7 C) (03/09 0728) Pulse Rate:  [63-80] 63 (03/09 0728) Resp:  [16-20] 19 (03/09 0728) BP: (132-144)/(79-87) 138/79 (03/09 0728) SpO2:  [96 %-97 %] 97 % (03/09 0728)   Constitutional:  oriented to person, place, and time. appears well-developed and well-nourished. No distress.  HENT: [place]/AT, PERRLA, no scleral icterus Mouth/Throat: Oropharynx is clear and moist. No oropharyngeal exudate.  Cardiovascular: Normal rate, regular rhythm and normal heart sounds. Exam reveals no gallop and no friction rub.  No murmur heard.  Pulmonary/Chest: Effort normal and breath sounds normal. No respiratory distress.  has no wheezes.  Neck = supple, no nuchal rigidity Abdominal: Soft. Bowel sounds are normal.  exhibits no distension. There is no tenderness.  Lymphadenopathy: no cervical adenopathy. No axillary adenopathy Neurological: alert and oriented to person, place, and time.  Skin: R leg with mod warmth, mild swelling, black scab anteriorl.  Psychiatric: a normal mood and affect.  behavior nml  Lab Results Recent Labs    05/28/19 0511 05/29/19 0406  WBC 6.5 9.0  HGB 12.1 13.2  HCT 37.8 41.3  NA 136 137  K 3.7 3.7  CL 104 104  CO2 27 24  BUN 10 10  CREATININE 0.63 0.71    Microbiology: Results for orders placed or performed during the hospital encounter of 05/24/19  Culture, blood (routine x 2)     Status: None (Preliminary result)   Collection Time: 05/24/19  8:50 PM   Specimen: BLOOD  Result Value Ref Range Status   Specimen Description   Final    BLOOD RIGHT ARM Performed at [facility], [address], [place], [place] [address]     Special Requests   Final    BOTTLES DRAWN AEROBIC AND ANAEROBIC Blood Culture results may not be optimal due to an excessive volume of blood received in culture bottles Performed at [facility], [address] [place], [place] [address]    Culture  Setup Time   Final    ANAEROBIC BOTTLE ONLY GRAM NEGATIVE RODS [place] ON 05/28/19 AT 0040 [facility] Performed at [facility], [place] [address], [place], [place] [address]    Culture GRAM NEGATIVE RODS  Final   Report Status PENDING  Incomplete  Urine culture     Status: None   Collection Time: 05/24/19  8:50 PM   Specimen: Urine, Random  Result Value Ref Range Status   Specimen Description   Final    URINE, RANDOM Performed at [facility], [address], [place], [place] [address]    Special Requests   Final    NONE Performed at [facility], [address] [place], [place] [address]    Culture   Final    NO GROWTH Performed at [facility], [place] [address] [place], [place] [address]    Report Status 05/25/2019 FINAL  Final  Blood Culture ID Panel (Reflexed)     Status: None   Collection Time: 05/24/19  8:50 PM  Result Value Ref Range Status   Enterococcus species NOT DETECTED NOT DETECTED Final   Listeria monocytogenes NOT DETECTED NOT DETECTED Final   Staphylococcus species NOT DETECTED NOT DETECTED Final   Staphylococcus aureus (BCID) NOT DETECTED NOT DETECTED Final   Streptococcus species NOT DETECTED NOT DETECTED Final   Streptococcus agalactiae NOT DETECTED NOT DETECTED Final   Streptococcus pneumoniae NOT DETECTED NOT DETECTED Final   Streptococcus pyogenes NOT DETECTED NOT DETECTED Final   Acinetobacter baumannii NOT DETECTED NOT DETECTED Final   Enterobacteriaceae species NOT DETECTED NOT DETECTED Final   Enterobacter cloacae complex NOT DETECTED NOT DETECTED Final   Escherichia coli NOT DETECTED NOT DETECTED Final   Klebsiella oxytoca NOT DETECTED NOT DETECTED Final    Klebsiella pneumoniae NOT DETECTED NOT DETECTED Final   Proteus species NOT DETECTED NOT DETECTED Final   Serratia marcescens NOT DETECTED NOT DETECTED Final   Haemophilus influenzae NOT DETECTED NOT DETECTED Final   Neisseria meningitidis NOT DETECTED NOT DETECTED Final   Pseudomonas aeruginosa NOT DETECTED NOT DETECTED Final   Candida albicans NOT DETECTED NOT DETECTED Final   Candida glabrata NOT DETECTED NOT DETECTED Final   Candida krusei NOT DETECTED NOT DETECTED Final   Candida parapsilosis NOT DETECTED NOT DETECTED Final   Candida tropicalis NOT DETECTED NOT DETECTED Final  Comment: Performed at [facility], [address] [place], [place] [address]  Culture, blood (routine x 2)     Status: None   Collection Time: 05/24/19  8:51 PM   Specimen: BLOOD  Result Value Ref Range Status   Specimen Description BLOOD BLOOD RIGHT HAND  Final   Special Requests   Final    BOTTLES DRAWN AEROBIC AND ANAEROBIC Blood Culture adequate volume   Culture   Final    NO GROWTH 5 DAYS Performed at [facility], [address] [place], [place] [address]    Report Status 05/29/2019 FINAL  Final  SARS CORONAVIRUS 2 (TAT 6-24 HRS) Nasopharyngeal Nasopharyngeal Swab     Status: None   Collection Time: 05/24/19  9:56 PM   Specimen: Nasopharyngeal Swab  Result Value Ref Range Status   SARS Coronavirus 2 NEGATIVE NEGATIVE Final    Comment: (NOTE) SARS-CoV-2 target nucleic acids are NOT DETECTED. The SARS-CoV-2 RNA is generally detectable in upper and lower respiratory specimens during the acute phase of infection. Negative results do not preclude SARS-CoV-2 infection, do not rule out co-infections with other pathogens, and should not be used as the sole basis for treatment or other patient management decisions. Negative results must be combined with clinical observations, patient history, and epidemiological information. The expected result is Negative. Fact Sheet for  Patients: SugarRoll.be Fact Sheet for Healthcare Providers: https://www.woods-mathews.com/ This test is not yet approved or cleared by the [place] FDA and  has been authorized for detection and/or diagnosis of SARS-CoV-2 by FDA under an Emergency Use Authorization (EUA). This EUA will remain  in effect (meaning this test can be used) for the duration of the COVID-19 declaration under Section 56 4(b)(1) of the Act, 21 U.S.C. section 360bbb-3(b)(1), unless the authorization is terminated or revoked sooner. Performed at [facility], [place] [address], [place], [place] [address]   Culture, blood (Routine X 2) w Reflex to ID Panel     Status: None (Preliminary result)   Collection Time: 05/28/19  3:30 PM   Specimen: BLOOD  Result Value Ref Range Status   Specimen Description BLOOD RIGHT ANTECUBITAL  Final   Special Requests   Final    BOTTLES DRAWN AEROBIC AND ANAEROBIC Blood Culture adequate volume   Culture   Final    NO GROWTH < 24 HOURS Performed at [facility], [address] [place], [place] [address]    Report Status PENDING  Incomplete  Culture, blood (Routine X 2) w Reflex to ID Panel     Status: None (Preliminary result)   Collection Time: 05/28/19  3:35 PM   Specimen: BLOOD  Result Value Ref Range Status   Specimen Description BLOOD BLOOD RIGHT HAND  Final   Special Requests   Final    BOTTLES DRAWN AEROBIC AND ANAEROBIC Blood Culture adequate volume   Culture   Final    NO GROWTH < 24 HOURS Performed at [facility], [place] [address] [place],  [address]    Report Status PENDING  Incomplete    Studies/Results: [place] EKG SITE RITE  Result Date: 05/28/2019 If Site Rite image not attached, placement could not be confirmed due to current cardiac rhythm.   Assessment/Plan: JEARLINE LAPAN is a 74 y.o. female recently admitted with secpis, fever to 104, related to RLE cellulitis. She now has  GNR in blood culture but is clinically much improved.  Her dog did lick her leg where the scab is so may  be related. 3/9 - doing well. Spoke with micro- no aerobic growth so likely anaerobe. Wont have ID for 1-2 more days.  Recommendations Can change to augmentin 875 bid as very little resistance among anaerobes as opposed to aerobic gram negative.   I can see 2 days after dc to fu on response and cultures.  Discussed with patient, daughter and attending.  Thank you very much for allowing me to participate in the care of this patient. Please call with questions.    Leonel Ramsay   05/29/2019, 2:21 PM

## 2019-05-30 ENCOUNTER — Encounter (INDEPENDENT_AMBULATORY_CARE_PROVIDER_SITE_OTHER): Payer: 59 | Admitting: Ophthalmology

## 2019-05-30 ENCOUNTER — Telehealth: Payer: Self-pay | Admitting: Emergency Medicine

## 2019-06-02 LAB — CULTURE, BLOOD (ROUTINE X 2)
Culture: NO GROWTH
Culture: NO GROWTH
Special Requests: ADEQUATE
Special Requests: ADEQUATE

## 2019-06-03 LAB — CULTURE, BLOOD (ROUTINE X 2)

## 2019-06-11 ENCOUNTER — Encounter (INDEPENDENT_AMBULATORY_CARE_PROVIDER_SITE_OTHER): Payer: 59 | Admitting: Ophthalmology

## 2019-06-14 ENCOUNTER — Other Ambulatory Visit: Payer: Self-pay

## 2019-06-14 ENCOUNTER — Emergency Department
Admission: EM | Admit: 2019-06-14 | Discharge: 2019-06-14 | Disposition: A | Discharge status: Home / Self Care | Payer: 59 | Attending: Emergency Medicine | Admitting: Emergency Medicine

## 2019-06-14 ENCOUNTER — Encounter (INDEPENDENT_AMBULATORY_CARE_PROVIDER_SITE_OTHER): Payer: 59 | Admitting: Ophthalmology

## 2019-06-14 DIAGNOSIS — I10 Essential (primary) hypertension: Secondary | ICD-10-CM

## 2019-06-14 DIAGNOSIS — I480 Paroxysmal atrial fibrillation: Secondary | ICD-10-CM | POA: Insufficient documentation

## 2019-06-14 DIAGNOSIS — I2510 Atherosclerotic heart disease of native coronary artery without angina pectoris: Secondary | ICD-10-CM | POA: Insufficient documentation

## 2019-06-14 DIAGNOSIS — Z7901 Long term (current) use of anticoagulants: Secondary | ICD-10-CM | POA: Insufficient documentation

## 2019-06-14 DIAGNOSIS — I119 Hypertensive heart disease without heart failure: Secondary | ICD-10-CM | POA: Diagnosis not present

## 2019-06-14 DIAGNOSIS — R531 Weakness: Secondary | ICD-10-CM | POA: Diagnosis present

## 2019-06-14 DIAGNOSIS — Z79899 Other long term (current) drug therapy: Secondary | ICD-10-CM | POA: Diagnosis not present

## 2019-06-14 DIAGNOSIS — H35033 Hypertensive retinopathy, bilateral: Secondary | ICD-10-CM | POA: Diagnosis not present

## 2019-06-14 DIAGNOSIS — H34812 Central retinal vein occlusion, left eye, with macular edema: Secondary | ICD-10-CM

## 2019-06-14 DIAGNOSIS — H43813 Vitreous degeneration, bilateral: Secondary | ICD-10-CM

## 2019-06-14 DIAGNOSIS — H348312 Tributary (branch) retinal vein occlusion, right eye, stable: Secondary | ICD-10-CM

## 2019-06-14 DIAGNOSIS — H338 Other retinal detachments: Secondary | ICD-10-CM

## 2019-06-14 LAB — BASIC METABOLIC PANEL
Anion gap: 11 (ref 5–15)
BUN: 14 mg/dL (ref 8–23)
CO2: 25 mmol/L (ref 22–32)
Calcium: 9.2 mg/dL (ref 8.9–10.3)
Chloride: 104 mmol/L (ref 98–111)
Creatinine, Ser: 1.06 mg/dL — ABNORMAL HIGH (ref 0.44–1.00)
GFR calc Af Amer: 59 mL/min — ABNORMAL LOW (ref >60)
GFR calc non Af Amer: 51 mL/min — ABNORMAL LOW (ref >60)
Glucose, Bld: 100 mg/dL — ABNORMAL HIGH (ref 70–99)
Potassium: 3.7 mmol/L (ref 3.5–5.1)
Sodium: 140 mmol/L (ref 135–145)

## 2019-06-14 LAB — CBC WITH DIFFERENTIAL/PLATELET
Abs Immature Granulocytes: 0.02 10*3/uL (ref 0.00–0.07)
Basophils Absolute: 0.1 10*3/uL (ref 0.0–0.1)
Basophils Relative: 1 %
Eosinophils Absolute: 0.2 10*3/uL (ref 0.0–0.5)
Eosinophils Relative: 3 %
HCT: 41.5 % (ref 36.0–46.0)
Hemoglobin: 13.1 g/dL (ref 12.0–15.0)
Immature Granulocytes: 0 %
Lymphocytes Relative: 36 %
Lymphs Abs: 2.8 10*3/uL (ref 0.7–4.0)
MCH: 26 pg (ref 26.0–34.0)
MCHC: 31.6 g/dL (ref 30.0–36.0)
MCV: 82.5 fL (ref 80.0–100.0)
Monocytes Absolute: 0.8 10*3/uL (ref 0.1–1.0)
Monocytes Relative: 10 %
Neutro Abs: 3.9 10*3/uL (ref 1.7–7.7)
Neutrophils Relative %: 50 %
Platelets: 302 10*3/uL (ref 150–400)
RBC: 5.03 MIL/uL (ref 3.87–5.11)
RDW: 14.9 % (ref 11.5–15.5)
WBC: 7.7 10*3/uL (ref 4.0–10.5)
nRBC: 0 % (ref 0.0–0.2)

## 2019-06-14 LAB — TROPONIN I (HIGH SENSITIVITY): Troponin I (High Sensitivity): 5 ng/L (ref <18)

## 2019-06-14 MED ORDER — DILTIAZEM HCL 25 MG/5ML IV SOLN
10.0000 mg | Freq: Once | INTRAVENOUS | Status: AC
  Administered 2019-06-14: 10 mg via INTRAVENOUS
  Filled 2019-06-14: qty 5

## 2019-06-14 MED ORDER — DILTIAZEM HCL 25 MG/5ML IV SOLN: 15.0000 mg | Freq: Once | INTRAVENOUS | Status: DC

## 2019-06-14 MED ORDER — SODIUM CHLORIDE 0.9 % IV BOLUS
500.0000 mL | Freq: Once | INTRAVENOUS | Status: AC
  Administered 2019-06-14: 500 mL via INTRAVENOUS

## 2019-06-14 NOTE — ED Notes (Signed)
Pt from homePt stating increased weakness over past couple days. Pt NSR on monitor with intermittent afib. MD at bedside. Repeat EKG to be done when pt in NSR.

## 2019-06-14 NOTE — ED Provider Notes (Signed)
[facility] Emergency Department Provider Note   ____________________________________________   I have reviewed the triage vital signs and the nursing notes.   HISTORY  Chief Complaint Weakness  History limited by: Not Limited   HPI Stacey Stone is a 74 y.o. female who presents to the emergency department today with primary concern for weakness. The patient states that she noticed it earlier today. She does have a history of afib and felt like she did go back into it. She has felt weak when she goes into it in the past. Did have an ablation. The patient states that she is on a blood thinner. Recently had an admission to the hospital for sepsis. Says that since being on the antibiotics for that admission her stomach has not felt right. She has had decreased oral intake. Denies any recent fevers.    Records reviewed. Per medical record review patient has a history of atrial fibrillation.   Past Medical History:  Diagnosis Date  . Anginal pain ([place])   . Arthritis t   knee  . Asthma    mild  . Breast cancer ([place])   . 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 Mamie Nick. at [facility] in [place]  . Depression   . Dyspnea   . Dysrhythmia   . GERD (gastroesophageal reflux disease)   . Hypertension   . Hypothyroidism   . 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  Mar[place] Kitchen No pertinent past medical history   . Personal history of radiation therapy   . Sleep apnea t   O2- 2l at bedtime and BiPap @ bedtime    Patient Active Problem List   Diagnosis Date Noted  . Sepsis ([place]) 05/24/2019  . Cellulitis of right lower extremity 05/24/2019  . Bilateral edema of lower extremity 05/24/2019  . PMB (postmenopausal bleeding) 06/17/2016  . Preoperative cardiovascular examination 05/13/2016  . Primary cancer of upper outer quadrant of left female breast ([place]) 05/09/2016  . Unstable  angina ([place]) 09/24/2015  . S/P cardiac catheterization 09/24/2015  . History of total right knee replacement 06/25/2015  . B12 deficiency 01/15/2015  . Vitamin D deficiency 01/15/2015  . Paroxysmal atrial fibrillation ([place]) 12/15/2014  . Aortic valve disorder 11/06/2014  . 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 OSA on CPAP 01/02/2014  . Angina at rest [facility]) 06/16/2013  . Essential hypertension 06/16/2013  . Hypercholesterolemia 06/16/2013  . Hypothyroidism 06/16/2013  . 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 CAD S/P percutaneous coronary angioplasty 01/08/2006    Past Surgical History:  Procedure Laterality Date  . BREAST BIOPSY Left 2/136/2018   INVASIVE MAMMARY CARCINOMA  . BREAST BIOPSY Right 05/25/2016   FIBROCYSTIC CHANGE WITH CALCIFICATIONS   . BREAST EXCISIONAL BIOPSY Left 06/04/2016   INVASIVE MAMMARY CARCINOMA.   Marland Kitchen BREAST LUMPECTOMY Left 06/04/2016   INVASIVE MAMMARY CARCINOMA.   Marland Kitchen CARDIAC CATHETERIZATION    . CARDIAC CATHETERIZATION N/A 09/24/2015   Procedure: Left Heart Cath and Coronary Angiography;  Surgeon: Isaias Cowman, MD;  Location: [facility];  Service: Cardiovascular;  Laterality: N/A;  . CARDIAC CATHETERIZATION N/A 09/24/2015   Procedure: Coronary Stent Intervention;  Surgeon: Isaias Cowman, MD;  Location: [facility];  Service: Cardiovascular;  Laterality: N/A;  . CHOLECYSTECTOMY  11/26   08/1970  . DILATATION & CURETTAGE/HYSTEROSCOPY WITH MYOSURE N/A 05/05/2015   Procedure: Hysteroscopy and endometrial  curretage;  Surgeon: Boykin Nearing, MD;  Location: [facility] ORS;  Service: Gynecology;  Laterality: N/A;  . DILATION AND CURETTAGE OF UTERUS    . EXCISION MASS FROM NECK  Right    Dr. Tami Ribas  . EYE SURGERY  t   bil cataract 9/08  . HAMMER TOE SURGERY Right   . JOINT REPLACEMENT Bilateral 2008, 11/26/ 2012   Partial Knee Replacements  . LEFT HEART CATH AND CORONARY  ANGIOGRAPHY N/A 10/25/2017   Procedure: LEFT HEART CATH AND CORONARY ANGIOGRAPHY;  Surgeon: Teodoro Spray, MD;  [facility];  Service: Cardiovascular;  Laterality: N/A;  . LEFT HEART CATH AND CORONARY ANGIOGRAPHY N/A 10/26/2017   Procedure: LEFT HEART CATH AND CORONARY ANGIOGRAPHY;  Surgeon: Isaias Cowman, MD;  Location: [facility];  Service: Cardiovascular;  Laterality: N/A;  . PARTIAL MASTECTOMY WITH NEEDLE LOCALIZATION Left 06/04/2016   Procedure: PARTIAL MASTECTOMY WITH NEEDLE LOCALIZATION;  Surgeon: Leonie Green, MD;  Location: [facility] OR[facility];  Service: General;  Laterality: Left;  . SCLERAL BUCKLE  02/16/2011   Procedure: SCLERAL BUCKLE;  Surgeon: Hayden Pedro, MD;  Location: [facility];  Service: Ophthalmology;  Laterality: Left;  Scleral Buckle Left Eye with Headscope Laser  . SENTINEL NODE BIOPSY Left 06/04/2016   Procedure: SENTINEL NODE BIOPSY;  Surgeon: Leonie Green, MD;  Location: [facility];  Service: General;  Laterality: Left;    Prior to Admission medications   Medication Sig Start Date End Date Taking? Authorizing Provider  acidophilus (RISAQUAD) CAPS capsule Take 1 capsule by mouth daily.    [provider]  apixaban (ELIQUIS) 5 MG TABS tablet Take 5 mg by mouth 2 (two) times daily.    [provider]  aspirin 81 MG EC tablet Take 81 mg by mouth daily.     [provider]  atorvastatin (LIPITOR) 40 MG tablet Take 1 tablet (40 mg total) by mouth at bedtime. 05/29/19   Enzo Bi, MD  diltiazem (CARDIZEM CD) 300 MG 24 hr capsule Take 300 mg by mouth daily.    [provider]  diltiazem (CARDIZEM) 30 MG tablet Take 30 mg by mouth 2 (two) times daily as needed (for breakthrough afib).     [provider]  Fluticasone-Salmeterol (ADVAIR) 100-50 MCG/DOSE AEPB Inhale 1 puff into the lungs 2 (two) times daily as needed (for asthma.).    [provider]  isosorbide mononitrate (IMDUR) 30 MG 24 hr  tablet Take 30 mg by mouth daily.    [provider]  letrozole (FEMARA) 2.5 MG tablet Take 1 tablet (2.5 mg total) by mouth daily. 05/29/19   Enzo Bi, MD  levothyroxine (SYNTHROID) 125 MCG tablet Take 125 mcg by mouth daily.     [provider]  metoprolol (TOPROL-XL) 100 MG 24 hr tablet Take 50 mg by mouth 2 (two) times daily.     [provider]  nitroGLYCERIN (NITROSTAT) 0.4 MG SL tablet Place 0.4 mg under the tongue every 5 (five) minutes as needed for chest pain. Maximum 3 doses, If no relief call md or 911.    [provider]  oxybutynin (DITROPAN) 5 MG tablet Take 5 mg by mouth at bedtime.     [provider]  potassium chloride (KLOR-CON) 10 MEQ tablet Take 1 tablet (10 mEq total) by mouth 2 (two) times daily. 05/29/19   Enzo Bi, MD  RABEprazole (ACIPHEX) 20 MG tablet Take 20 mg by mouth daily.      [provider]  sertraline (ZOLOFT) 50 MG tablet Take 50 mg by mouth at bedtime.      [provider]  torsemide (DEMADEX) 20 MG tablet Take 10 mg by mouth as directed. Take on Tuesday and Friday    [provider]    Allergies Codeine  Family History  Problem Relation Age of Onset  . Breast cancer Mother 43  . Heart disease Father   . Breast cancer Paternal Aunt 73  . Breast cancer Cousin   . Anesthesia problems Neg Hx   . Hypotension Neg Hx   . Malignant hyperthermia Neg Hx   . Pseudochol deficiency Neg Hx     Social History Social History   Tobacco Use  . Smoking status: Never Smoker  . Smokeless tobacco: Never Used  Substance Use Topics  . Alcohol use: No  . Drug use: No    Review of Systems Constitutional: No fever/chills Eyes: No visual changes. ENT: No sore throat. Cardiovascular: Positive for palpitations.  Respiratory: Denies shortness of breath. Gastrointestinal: No abdominal pain.  No nausea, no vomiting.  No diarrhea.   Genitourinary: Negative for dysuria. Musculoskeletal: Negative  for back pain. Skin: Negative for rash. Neurological: Negative for headaches, focal weakness or numbness.  ____________________________________________   PHYSICAL EXAM:  VITAL SIGNS: ED Triage Vitals  Enc Vitals Group     BP 06/14/19 1324 111/78     Pulse Rate 06/14/19 1324 (!) 59     Resp 06/14/19 1324 20     Temp 06/14/19 1324 98.3 F (36.8 C)     Temp Source 06/14/19 1324 Oral     SpO2 06/14/19 1324 100 %     Weight 06/14/19 1325 184 lb (83.5 kg)     Height 06/14/19 1325 5\' 3"  (1.6 m)     Head Circumference --      Peak Flow --      Pain Score 06/14/19 1325 0   Constitutional: Alert and oriented.  Eyes: Conjunctivae are normal.  ENT      Head: Normocephalic and atraumatic.      Nose: No congestion/rhinnorhea.      Mouth/Throat: Mucous membranes are moist.      Neck: No stridor. Hematological/Lymphatic/Immunilogical: No cervical lymphadenopathy. Cardiovascular: Irregular irregular rhythm.  No murmurs, rubs, or gallops.  Respiratory: Normal respiratory effort without tachypnea nor retractions. Breath sounds are clear and equal bilaterally. No wheezes/rales/rhonchi. Gastrointestinal: Soft and non tender. No rebound. No guarding.  Genitourinary: Deferred Musculoskeletal: Normal range of motion in all extremities. No lower extremity edema. Neurologic:  Normal speech and language. No gross focal neurologic deficits are appreciated.  Skin:  Skin is warm, dry and intact. No rash noted. Psychiatric: Mood and affect are normal. Speech and behavior are normal. Patient exhibits appropriate insight and judgment.  ____________________________________________    LABS (pertinent positives/negatives)  Trop hs 5 CBC wbc 7.7, hgb 13.1, plt 302 BMP wnl except glu 100, cr 1.06  ____________________________________________   EKG  I, Nance Pear, attending physician, personally viewed and interpreted this EKG  EKG Time: 1314 Rate: 103 Rhythm: atrial flutter/afib Axis:  normal Intervals: qtc 463 QRS: narrow ST changes: no st elevation Impression: abnormal ekg  I, Nance Pear, attending physician, personally viewed and interpreted this EKG  EKG Time: 1336 Rate: 61 Rhythm: sinus rhythm Axis: normal Intervals: qtc 416 QRS: low voltage precordial leads ST changes: no st elevation Impression: abnormal ekg  ____________________________________________    RADIOLOGY  None  ____________________________________________   PROCEDURES  Procedures  ____________________________________________   INITIAL IMPRESSION / ASSESSMENT AND PLAN / ED COURSE  Pertinent labs & imaging results that were available during my care of the patient were reviewed by me and considered in my medical decision making (see chart for details).   Patient presented to the emergency department today because of concerns for some weakness and feeling like she was back in atrial fibrillation.  Patient was initially in atrial fibrillation however she would jump in and out of it during her stay here in the emergency department.  She was given dose of diltiazem and IV fluids here which did help keep her more in a normal sinus rhythm.  Patient is anticoagulated.  Will have patient follow-up with cardiologist.   ____________________________________________   FINAL CLINICAL IMPRESSION(S) / ED DIAGNOSES  Final diagnoses:  Weakness  Paroxysmal atrial fibrillation ([place])     Note: This dictation was prepared with Dragon dictation. Any transcriptional errors that result from this process are unintentional     Nance Pear, MD 06/14/19 1704

## 2019-06-14 NOTE — Discharge Instructions (Addendum)
Please seek medical attention for any high fevers, chest pain, shortness of breath, change in behavior, persistent vomiting, bloody stool or any other new or concerning symptoms.  

## 2019-06-14 NOTE — ED Triage Notes (Addendum)
Pt here via [facility] with weakness that started earlier today. Pt has hx of afib.

## 2019-06-15 ENCOUNTER — Other Ambulatory Visit: Payer: Self-pay

## 2019-06-15 NOTE — Progress Notes (Signed)
Patient prescreened for appointment. Patient has no concerns or questions.  

## 2019-06-16 NOTE — Progress Notes (Signed)
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 MammaPrint low risk.  INTERVAL HISTORY: Patient returns to clinic today for routine 61-monthevaluation.  She continues to tolerate letrozole well without significant side effects.  She currently feels well and is asymptomatic. She denies any fevers, night sweats, or weight loss. She has no neurologic complaints. She has noted no new lymphadenopathy.  She denies any chest pain, shortness of breath, cough, or hemoptysis.  She denies any nausea, vomiting, constipation, or diarrhea. She has no urinary complaints.  Patient offers no specific complaints today.  REVIEW OF SYSTEMS:   Review of Systems  Constitutional: Negative for chills, diaphoresis, fever, malaise/fatigue and weight loss.  Respiratory: Negative.  Negative for cough and shortness of breath.   Cardiovascular: Negative.  Negative for chest pain and leg swelling.  Gastrointestinal: Negative.  Negative for abdominal pain.  Genitourinary: Negative.  Negative for dysuria.  Musculoskeletal: Negative.  Negative for back pain.  Skin: Negative.  Negative for rash.  Neurological: Negative.  Negative for sensory change, focal weakness, weakness and headaches.  Psychiatric/Behavioral: Negative.  The patient is not nervous/anxious.     As per HPI. Otherwise, a complete review of systems is negative.  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  MammaPrint was reported as low risk, therefore she did not require adjuvant chemotherapy. She completed adjuvant XRT. Will complete 5 year of letrozole in July 2023.   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  Patient's most recent CT scan on April 28, 2018 revealed no evidence of progressive or recurrent disease.    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[place]  07/05/2014 Initial Diagnosis   MALT (mucosa associated lymphoid tissue)   Primary cancer of upper outer quadrant of left female breast ([facility] [place]  05/09/2016 Initial Diagnosis   Primary cancer of upper outer quadrant of left female breast ([facility]     PAST MEDICAL HISTORY: Past Medical History:  Diagnosis Date  . Anginal pain ([place]   . Arthritis t   knee  . Asthma    mild  . Breast cancer ([place]   . 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 [place] . Depression   . Dyspnea   . Dysrhythmia   . GERD (gastroesophageal reflux disease)   . Hypertension   . Hypothyroidism   . MALT (mucosa associated lymphoid tissue) ([place] 07/05/2014   Right neck mass resected 01/2014.  .Marland KitchenNo pertinent past medical history   . Personal history of radiation therapy   . Sleep apnea t   O2- 2l at bedtime and BiPap @ bedtime    PAST SURGICAL HISTORY: Past Surgical History:  Procedure Laterality Date  . BREAST BIOPSY Left 2/136/2018   INVASIVE MAMMARY CARCINOMA  . BREAST BIOPSY Right 05/25/2016   FIBROCYSTIC CHANGE WITH CALCIFICATIONS   . BREAST EXCISIONAL BIOPSY Left 06/04/2016   INVASIVE MAMMARY CARCINOMA.   .Marland KitchenBREAST LUMPECTOMY  Left 06/04/2016   INVASIVE MAMMARY CARCINOMA.   Marland Kitchen CARDIAC CATHETERIZATION    . CARDIAC CATHETERIZATION N/A 09/24/2015   Procedure: Left Heart Cath and Coronary Angiography;  Surgeon: Isaias Cowman, MD;  Location: [place] [facility];  Service: Cardiovascular;  Laterality: N/A;  . CARDIAC CATHETERIZATION N/A 09/24/2015    Procedure: Coronary Stent Intervention;  Surgeon: Isaias Cowman, MD;  Location: [place] [facility];  Service: Cardiovascular;  Laterality: N/A;  . CHOLECYSTECTOMY  11/26   08/1970  . DILATATION & CURETTAGE/HYSTEROSCOPY WITH MYOSURE N/A 05/05/2015   Procedure: Hysteroscopy and endometrial curretage;  Surgeon: Boykin Nearing, MD;  Location: [facility];  Service: Gynecology;  Laterality: N/A;  . DILATION AND CURETTAGE OF UTERUS    . EXCISION MASS FROM NECK  Right    Dr. Tami Ribas  . EYE SURGERY  t   bil cataract 9/08  . HAMMER TOE SURGERY Right   . JOINT REPLACEMENT Bilateral 2008, 11/26/ 2012   Partial Knee Replacements  . LEFT HEART CATH AND CORONARY ANGIOGRAPHY N/A 10/25/2017   Procedure: LEFT HEART CATH AND CORONARY ANGIOGRAPHY;  Surgeon: Teodoro Spray, MD;  Location: [place] [facility];  Service: Cardiovascular;  Laterality: N/A;  . LEFT HEART CATH AND CORONARY ANGIOGRAPHY N/A 10/26/2017   Procedure: LEFT HEART CATH AND CORONARY ANGIOGRAPHY;  Surgeon: Isaias Cowman, MD;  Location: [place] [facility];  Service: Cardiovascular;  Laterality: N/A;  . PARTIAL MASTECTOMY WITH NEEDLE LOCALIZATION Left 06/04/2016   Procedure: PARTIAL MASTECTOMY WITH NEEDLE LOCALIZATION;  Surgeon: Leonie Green, MD;  Location: [facility];  Service: General;  Laterality: Left;  . SCLERAL BUCKLE  02/16/2011   Procedure: SCLERAL BUCKLE;  Surgeon: Hayden Pedro, MD;  Location: [place];  Service: Ophthalmology;  Laterality: Left;  Scleral Buckle Left Eye with Headscope Laser  . SENTINEL NODE BIOPSY Left 06/04/2016   Procedure: SENTINEL NODE BIOPSY;  Surgeon: Leonie Green, MD;  Location: [facility];  Service: General;  Laterality: Left;    FAMILY HISTORY Family History  Problem Relation Age of Onset  . Breast cancer Mother 9  . Heart disease Father   . Breast cancer Paternal Aunt 73  . Breast cancer Cousin   . Anesthesia problems Neg Hx   . Hypotension Neg Hx   . Malignant hyperthermia  Neg Hx   . Pseudochol deficiency Neg Hx     GYNECOLOGIC HISTORY:  No LMP recorded. Patient is postmenopausal.     ADVANCED DIRECTIVES:    HEALTH MAINTENANCE: Social History   Tobacco Use  . Smoking status: Never Smoker  . Smokeless tobacco: Never Used  Substance Use Topics  . Alcohol use: No  . Drug use: No     Colonoscopy:  PAP:  Bone density:  Mammogram: November 2016  Allergies  Allergen Reactions  . Codeine Other (See Comments)    Stroke like symptoms    Current Outpatient Medications  Medication Sig Dispense Refill  . acidophilus (RISAQUAD) CAPS capsule Take 1 capsule by mouth daily.    Marland Kitchen apixaban (ELIQUIS) 5 MG TABS tablet Take 5 mg by mouth 2 (two) times daily.    Marland Kitchen aspirin 81 MG EC tablet Take 81 mg by mouth daily.     Marland Kitchen atorvastatin (LIPITOR) 40 MG tablet Take 1 tablet (40 mg total) by mouth at bedtime.    Marland Kitchen diltiazem (CARDIZEM CD) 300 MG 24 hr capsule Take 300 mg by mouth daily.    Marland Kitchen diltiazem (CARDIZEM) 30 MG tablet Take 30 mg by mouth  2 (two) times daily as needed (for breakthrough afib).     . Fluticasone-Salmeterol (ADVAIR) 100-50 MCG/DOSE AEPB Inhale 1 puff into the lungs 2 (two) times daily as needed (for asthma.).    Marland Kitchen isosorbide mononitrate (IMDUR) 30 MG 24 hr tablet Take 30 mg by mouth daily.    Marland Kitchen letrozole (FEMARA) 2.5 MG tablet Take 1 tablet (2.5 mg total) by mouth daily.    Marland Kitchen levothyroxine (SYNTHROID) 125 MCG tablet Take 125 mcg by mouth daily.     . metoprolol (TOPROL-XL) 100 MG 24 hr tablet Take 50 mg by mouth 2 (two) times daily.     . nitroGLYCERIN (NITROSTAT) 0.4 MG SL tablet Place 0.4 mg under the tongue every 5 (five) minutes as needed for chest pain. Maximum 3 doses, If no relief call md or 911.    Marland Kitchen oxybutynin (DITROPAN) 5 MG tablet Take 5 mg by mouth at bedtime.     . potassium chloride (KLOR-CON) 10 MEQ tablet Take 1 tablet (10 mEq total) by mouth 2 (two) times daily.    . RABEprazole (ACIPHEX) 20 MG tablet Take 20 mg by mouth daily.       . sertraline (ZOLOFT) 50 MG tablet Take 50 mg by mouth at bedtime.      . torsemide (DEMADEX) 20 MG tablet Take 10 mg by mouth as directed. Take on Tuesday and Friday     No current facility-administered medications for this visit.    OBJECTIVE: BP 135/72 (BP Location: Right Arm, Patient Position: Sitting)   Pulse 62   Temp 97.6 F (36.4 C) (Tympanic)   Resp 16   Wt 189 lb 1.6 oz (85.8 kg)   SpO2 99%   BMI 33.50 kg/m    Body mass index is 33.5 kg/m.    ECOG FS:0 - Asymptomatic  General: Well-developed, well-nourished, no acute distress. Eyes: Pink conjunctiva, anicteric sclera. HEENT: Normocephalic, moist mucous membranes. Breast: Exam deferred today. Lungs: No audible wheezing or coughing. Heart: Regular rate and rhythm. Abdomen: Soft, nontender, no obvious distention. Musculoskeletal: No edema, cyanosis, or clubbing. Neuro: Alert, answering all questions appropriately. Cranial nerves grossly intact. Skin: No rashes or petechiae noted. Psych: Normal affect.  LAB RESULTS:   STUDIES: No results found.  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 MammaPrint low risk.  PLAN:    1. 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 MammaPrint was reported as low risk, therefore she did not require adjuvant chemotherapy. She completed adjuvant XRT.  Continue letrozole for a total of 5 years completing treatment in July 2023.  Her most recent mammogram on May 01, 2019 was reported as BI-RADS 2, repeat in February 2022.  Patient will have video assisted telemedicine visit in 6 months for routine evaluation. 2. 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   Patient's most recent CT scan on April 28, 2018 reviewed independently revealed no evidence of progressive or recurrent disease.  Patient is now nearly 5 years removed from her excision, therefore no further imaging is necessary unless there is suspicion of progression of disease. 3.  Left renal lesion: CT scan results from April 28, 2018 revealed a stable 9 x 8 mm cyst which is likely  benign. 4.  Bone mineral density: Patient's most recent bone mineral density on November 09, 2017 reported T score of 0.0.  This is considered normal.  Repeat in August 2021.    Patient expressed understanding and was in agreement with this plan. She also understands that She can call clinic at any time with any questions, concerns, or complaints.     Lloyd Huger, MD   06/18/2019 4:14 PM

## 2019-06-18 ENCOUNTER — Inpatient Hospital Stay: Payer: 59 | Attending: Oncology | Admitting: Oncology

## 2019-06-18 VITALS — BP 135/72 | HR 62 | Temp 97.6°F | Resp 16 | Wt 189.1 lb

## 2019-06-18 DIAGNOSIS — E039 Hypothyroidism, unspecified: Secondary | ICD-10-CM | POA: Insufficient documentation

## 2019-06-18 DIAGNOSIS — Z170 Estrogen receptor positive status [ER+]: Secondary | ICD-10-CM | POA: Insufficient documentation

## 2019-06-18 DIAGNOSIS — I10 Essential (primary) hypertension: Secondary | ICD-10-CM | POA: Diagnosis not present

## 2019-06-18 DIAGNOSIS — I2510 Atherosclerotic heart disease of native coronary artery without angina pectoris: Secondary | ICD-10-CM | POA: Diagnosis not present

## 2019-06-18 DIAGNOSIS — Z79899 Other long term (current) drug therapy: Secondary | ICD-10-CM | POA: Insufficient documentation

## 2019-06-18 DIAGNOSIS — F329 Major depressive disorder, single episode, unspecified: Secondary | ICD-10-CM | POA: Diagnosis not present

## 2019-06-18 DIAGNOSIS — Z8572 Personal history of non-Hodgkin lymphomas: Secondary | ICD-10-CM | POA: Diagnosis not present

## 2019-06-18 DIAGNOSIS — Z79811 Long term (current) use of aromatase inhibitors: Secondary | ICD-10-CM | POA: Insufficient documentation

## 2019-06-18 DIAGNOSIS — C50412 Malignant neoplasm of upper-outer quadrant of left female breast: Secondary | ICD-10-CM | POA: Diagnosis present

## 2019-07-13 DIAGNOSIS — R600 Localized edema: Secondary | ICD-10-CM | POA: Insufficient documentation

## 2019-07-13 DIAGNOSIS — R609 Edema, unspecified: Secondary | ICD-10-CM | POA: Insufficient documentation

## 2019-08-09 ENCOUNTER — Other Ambulatory Visit: Payer: Self-pay

## 2019-08-09 ENCOUNTER — Encounter (INDEPENDENT_AMBULATORY_CARE_PROVIDER_SITE_OTHER): Payer: 59 | Admitting: Ophthalmology

## 2019-08-09 DIAGNOSIS — H338 Other retinal detachments: Secondary | ICD-10-CM

## 2019-08-09 DIAGNOSIS — H34831 Tributary (branch) retinal vein occlusion, right eye, with macular edema: Secondary | ICD-10-CM | POA: Diagnosis not present

## 2019-08-09 DIAGNOSIS — H35033 Hypertensive retinopathy, bilateral: Secondary | ICD-10-CM

## 2019-08-09 DIAGNOSIS — I10 Essential (primary) hypertension: Secondary | ICD-10-CM

## 2019-08-09 DIAGNOSIS — H43813 Vitreous degeneration, bilateral: Secondary | ICD-10-CM

## 2019-08-09 DIAGNOSIS — H34812 Central retinal vein occlusion, left eye, with macular edema: Secondary | ICD-10-CM

## 2019-08-29 ENCOUNTER — Other Ambulatory Visit: Payer: Self-pay | Admitting: Oncology

## 2019-09-20 ENCOUNTER — Other Ambulatory Visit: Payer: Self-pay

## 2019-09-20 ENCOUNTER — Encounter (INDEPENDENT_AMBULATORY_CARE_PROVIDER_SITE_OTHER): Payer: BC Managed Care – PPO | Admitting: Ophthalmology

## 2019-09-20 DIAGNOSIS — I10 Essential (primary) hypertension: Secondary | ICD-10-CM

## 2019-09-20 DIAGNOSIS — H35033 Hypertensive retinopathy, bilateral: Secondary | ICD-10-CM

## 2019-09-20 DIAGNOSIS — H34812 Central retinal vein occlusion, left eye, with macular edema: Secondary | ICD-10-CM | POA: Diagnosis not present

## 2019-09-20 DIAGNOSIS — H43813 Vitreous degeneration, bilateral: Secondary | ICD-10-CM

## 2019-09-20 DIAGNOSIS — H338 Other retinal detachments: Secondary | ICD-10-CM

## 2019-09-20 DIAGNOSIS — H34831 Tributary (branch) retinal vein occlusion, right eye, with macular edema: Secondary | ICD-10-CM | POA: Diagnosis not present

## 2019-10-23 ENCOUNTER — Other Ambulatory Visit: Payer: Self-pay

## 2019-10-23 ENCOUNTER — Encounter: Payer: Self-pay | Admitting: Radiation Oncology

## 2019-10-24 ENCOUNTER — Encounter: Payer: Self-pay | Admitting: Radiation Oncology

## 2019-10-24 ENCOUNTER — Ambulatory Visit
Admission: RE | Admit: 2019-10-24 | Discharge: 2019-10-24 | Disposition: A | Discharge status: Home / Self Care | Payer: BC Managed Care – PPO | Source: Ambulatory Visit | Attending: Radiation Oncology | Admitting: Radiation Oncology

## 2019-10-24 VITALS — BP 130/84 | HR 67 | Temp 97.5°F | Wt 186.0 lb

## 2019-10-24 DIAGNOSIS — C50412 Malignant neoplasm of upper-outer quadrant of left female breast: Secondary | ICD-10-CM

## 2019-10-24 NOTE — Progress Notes (Signed)
Radiation Oncology Follow up Note  Name: Stacey Stone   Date:   10/24/2019 MRN:  [id number] DOB: 04/24/1945    This 74 y.o. female presents to the clinic today for 3-year follow-up status post whole breast radiation to her left breast for ER/PR positive invasive mammary carcinoma.Marland Kitchen  REFERRING PROVIDER: Rusty Aus, MD  HPI: Patient is a.  74 year old female now out 3 years having pleated whole breast radiation to her left breast for ER/PR positive invasive mammary carcinoma seen today in routine follow-up she is doing well.  She specifically denies breast tenderness cough or bone pain.  She had mammograms back in February which were BI-RADS 2 benign which I have reviewed.  She is currently on Femara tolerate that well without side effect.  COMPLICATIONS OF TREATMENT: none  FOLLOW UP COMPLIANCE: keeps appointments   PHYSICAL EXAM:  BP 130/84 (BP Location: Right Arm, Patient Position: Sitting, Cuff Size: Normal)   Pulse 67   Temp (!) 97.5 F (36.4 C) (Tympanic)   Wt 186 lb (84.4 kg)   BMI 32.95 kg/m  Lungs are clear to A&P cardiac examination essentially unremarkable with regular rate and rhythm. No dominant mass or nodularity is noted in either breast in 2 positions examined. Incision is well-healed. No axillary or supraclavicular adenopathy is appreciated. Cosmetic result is excellent.  Some slight smooth nontender nodularity present in her lumpectomy scar which is stable over time.  Well-developed well-nourished patient in NAD. HEENT reveals PERLA, EOMI, discs not visualized.  Oral cavity is clear. No oral mucosal lesions are identified. Neck is clear without evidence of cervical or supraclavicular adenopathy. Lungs are clear to A&P. Cardiac examination is essentially unremarkable with regular rate and rhythm without murmur rub or thrill. Abdomen is benign with no organomegaly or masses noted. Motor sensory and DTR levels are equal and symmetric in the upper and lower extremities.  Cranial nerves II through XII are grossly intact. Proprioception is intact. No peripheral adenopathy or edema is identified. No motor or sensory levels are noted. Crude visual fields are within normal range.  RADIOLOGY RESULTS: Mammograms reviewed compatible with above-stated findings  PLAN: Present time patient is doing well 3 years out with no evidence of disease.  I am pleased with her overall progress.  I have asked to see her back in 1 year and then will discontinue follow-up care.  Patient scheduled for bone density study later this month as well as follow-up mammograms.  She continues on Femara without side effect.  Patient knows to call with any concerns.  I would like to take this opportunity to thank you for allowing me to participate in the care of your patient.Noreene Filbert, MD

## 2019-11-01 ENCOUNTER — Other Ambulatory Visit: Payer: Self-pay

## 2019-11-01 ENCOUNTER — Encounter (INDEPENDENT_AMBULATORY_CARE_PROVIDER_SITE_OTHER): Payer: Medicare Other | Admitting: Ophthalmology

## 2019-11-01 DIAGNOSIS — H338 Other retinal detachments: Secondary | ICD-10-CM

## 2019-11-01 DIAGNOSIS — I10 Essential (primary) hypertension: Secondary | ICD-10-CM

## 2019-11-01 DIAGNOSIS — H34831 Tributary (branch) retinal vein occlusion, right eye, with macular edema: Secondary | ICD-10-CM

## 2019-11-01 DIAGNOSIS — H35033 Hypertensive retinopathy, bilateral: Secondary | ICD-10-CM | POA: Diagnosis not present

## 2019-11-01 DIAGNOSIS — H43813 Vitreous degeneration, bilateral: Secondary | ICD-10-CM

## 2019-11-01 DIAGNOSIS — H34812 Central retinal vein occlusion, left eye, with macular edema: Secondary | ICD-10-CM

## 2019-11-13 ENCOUNTER — Ambulatory Visit
Admission: RE | Admit: 2019-11-13 | Discharge: 2019-11-13 | Disposition: A | Discharge status: Home / Self Care | Payer: BC Managed Care – PPO | Source: Ambulatory Visit | Attending: Oncology | Admitting: Oncology

## 2019-11-13 ENCOUNTER — Other Ambulatory Visit: Payer: Self-pay

## 2019-11-13 DIAGNOSIS — C50412 Malignant neoplasm of upper-outer quadrant of left female breast: Secondary | ICD-10-CM | POA: Diagnosis not present

## 2019-12-20 ENCOUNTER — Other Ambulatory Visit: Payer: Self-pay

## 2019-12-20 ENCOUNTER — Encounter (INDEPENDENT_AMBULATORY_CARE_PROVIDER_SITE_OTHER): Payer: BC Managed Care – PPO | Admitting: Ophthalmology

## 2019-12-20 DIAGNOSIS — I10 Essential (primary) hypertension: Secondary | ICD-10-CM

## 2019-12-20 DIAGNOSIS — H338 Other retinal detachments: Secondary | ICD-10-CM

## 2019-12-20 DIAGNOSIS — H34812 Central retinal vein occlusion, left eye, with macular edema: Secondary | ICD-10-CM | POA: Diagnosis not present

## 2019-12-20 DIAGNOSIS — H34831 Tributary (branch) retinal vein occlusion, right eye, with macular edema: Secondary | ICD-10-CM | POA: Diagnosis not present

## 2019-12-20 DIAGNOSIS — H35033 Hypertensive retinopathy, bilateral: Secondary | ICD-10-CM | POA: Diagnosis not present

## 2019-12-20 DIAGNOSIS — H43813 Vitreous degeneration, bilateral: Secondary | ICD-10-CM

## 2019-12-20 NOTE — Progress Notes (Signed)
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 MammaPrint low risk.  INTERVAL HISTORY: Patient returns to clinic today for routine 83-monthevaluation.  She currently feels well and is asymptomatic.  She continues to tolerate letrozole without significant side effects.  She has no neurologic complaints. She has noted no new lymphadenopathy.  She denies any chest pain, shortness of breath, cough, or hemoptysis.  She denies any nausea, vomiting, constipation, or diarrhea. She has no urinary complaints.  Patient feels at her baseline offers no specific complaints today.  REVIEW OF SYSTEMS:   Review of Systems  Constitutional: Negative for chills, diaphoresis, fever, malaise/fatigue and weight loss.  Respiratory: Negative.  Negative for cough and shortness of breath.   Cardiovascular: Negative.  Negative for chest pain and leg swelling.  Gastrointestinal: Negative.  Negative for abdominal pain.  Genitourinary: Negative.  Negative for dysuria.  Musculoskeletal: Negative.  Negative for back pain.  Skin: Negative.  Negative for rash.  Neurological: Negative.  Negative for sensory change, focal weakness, weakness and headaches.  Psychiatric/Behavioral: Negative.  The patient is not nervous/anxious.     As per HPI. Otherwise, a complete review of systems is negative.  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 MammaPrint was reported as low risk,  therefore she did not require adjuvant chemotherapy. She completed adjuvant XRT. Will complete 5 year of letrozole in July 2023.   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  Patient's most recent CT scan on April 28, 2018 revealed no evidence of progressive or recurrent disease.    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 Anginal pain ([place]   . Arthritis t   knee  . Asthma    mild  . Breast cancer ([place]   . 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 [place] . Depression   . Dyspnea   . Dysrhythmia   . GERD (gastroesophageal reflux disease)   . Hypertension   . Hypothyroidism   . MALT (mucosa associated lymphoid tissue) ([facility] 07/05/2014   Right neck mass resected 01/2014.  .Marland KitchenNo pertinent past medical history   . Personal history of radiation therapy   . Sleep apnea t   O2- 2l at bedtime and BiPap @ bedtime    PAST SURGICAL HISTORY: Past Surgical History:  Procedure Laterality Date  . BREAST BIOPSY Left 2/136/2018   INVASIVE MAMMARY CARCINOMA  . BREAST BIOPSY Right 05/25/2016   FIBROCYSTIC CHANGE WITH CALCIFICATIONS   . BREAST EXCISIONAL BIOPSY Left 06/04/2016   INVASIVE MAMMARY CARCINOMA.   .Marland KitchenBREAST LUMPECTOMY Left 06/04/2016   INVASIVE  MAMMARY CARCINOMA.   Marland Kitchen CARDIAC CATHETERIZATION    . CARDIAC CATHETERIZATION N/A 09/24/2015   Procedure: Left Heart Cath and Coronary Angiography;  Surgeon: Isaias Cowman, MD;  Location: [place] [facility];  Service: Cardiovascular;  Laterality: N/A;  . CARDIAC CATHETERIZATION N/A 09/24/2015   Procedure: Coronary Stent Intervention;   Surgeon: Isaias Cowman, MD;  Location: [place] [facility];  Service: Cardiovascular;  Laterality: N/A;  . CHOLECYSTECTOMY  11/26   08/1970  . DILATATION & CURETTAGE/HYSTEROSCOPY WITH MYOSURE N/A 05/05/2015   Procedure: Hysteroscopy and endometrial curretage;  Surgeon: Boykin Nearing, MD;  Location: [facility];  Service: Gynecology;  Laterality: N/A;  . DILATION AND CURETTAGE OF UTERUS    . EXCISION MASS FROM NECK  Right    Dr. Tami Ribas  . EYE SURGERY  t   bil cataract 9/08  . HAMMER TOE SURGERY Right   . JOINT REPLACEMENT Bilateral 2008, 11/26/ 2012   Partial Knee Replacements  . LEFT HEART CATH AND CORONARY ANGIOGRAPHY N/A 10/25/2017   Procedure: LEFT HEART CATH AND CORONARY ANGIOGRAPHY;  Surgeon: Teodoro Spray, MD;  Location: [place] [facility];  Service: Cardiovascular;  Laterality: N/A;  . LEFT HEART CATH AND CORONARY ANGIOGRAPHY N/A 10/26/2017   Procedure: LEFT HEART CATH AND CORONARY ANGIOGRAPHY;  Surgeon: Isaias Cowman, MD;  Location: [place] [facility];  Service: Cardiovascular;  Laterality: N/A;  . PARTIAL MASTECTOMY WITH NEEDLE LOCALIZATION Left 06/04/2016   Procedure: PARTIAL MASTECTOMY WITH NEEDLE LOCALIZATION;  Surgeon: Leonie Green, MD;  Location: [facility];  Service: General;  Laterality: Left;  . SCLERAL BUCKLE  02/16/2011   Procedure: SCLERAL BUCKLE;  Surgeon: Hayden Pedro, MD;  Location: [place];  Service: Ophthalmology;  Laterality: Left;  Scleral Buckle Left Eye with Headscope Laser  . SENTINEL NODE BIOPSY Left 06/04/2016   Procedure: SENTINEL NODE BIOPSY;  Surgeon: Leonie Green, MD;  Location: [facility];  Service: General;  Laterality: Left;    FAMILY HISTORY Family History  Problem Relation Age of Onset  . Breast cancer Mother 3  . Heart disease Father   . Breast cancer Paternal Aunt 73  . Breast cancer Cousin   . Anesthesia problems Neg Hx   . Hypotension Neg Hx   . Malignant hyperthermia Neg Hx   . Pseudochol deficiency Neg Hx      GYNECOLOGIC HISTORY:  No LMP recorded. Patient is postmenopausal.     ADVANCED DIRECTIVES:    HEALTH MAINTENANCE: Social History   Tobacco Use  . Smoking status: Never Smoker  . Smokeless tobacco: Never Used  Vaping Use  . Vaping Use: Never used  Substance Use Topics  . Alcohol use: No  . Drug use: No     Colonoscopy:  PAP:  Bone density:  Mammogram: November 2016  Allergies  Allergen Reactions  . Codeine Other (See Comments)    Stroke like symptoms    Current Outpatient Medications  Medication Sig Dispense Refill  . acidophilus (RISAQUAD) CAPS capsule Take 1 capsule by mouth daily.    Marland Kitchen amLODipine (NORVASC) 5 MG tablet Take 5 mg by mouth daily.    Marland Kitchen apixaban (ELIQUIS) 5 MG TABS tablet Take 5 mg by mouth 2 (two) times daily.    Marland Kitchen aspirin 81 MG EC tablet Take 81 mg by mouth daily.     Marland Kitchen atorvastatin (LIPITOR) 40 MG tablet Take 1 tablet (40 mg total) by mouth at bedtime.    Marland Kitchen diltiazem (CARDIZEM) 30 MG tablet Take 30 mg by  mouth 2 (two) times daily as needed (for breakthrough afib).     . Fluticasone-Salmeterol (ADVAIR) 100-50 MCG/DOSE AEPB Inhale 1 puff into the lungs 2 (two) times daily as needed (for asthma.).    Marland Kitchen isosorbide mononitrate (IMDUR) 30 MG 24 hr tablet Take 30 mg by mouth daily.    Marland Kitchen letrozole (FEMARA) 2.5 MG tablet TAKE 1 TABLET BY MOUTH ONCE DAILY 90 tablet 3  . levothyroxine (SYNTHROID) 125 MCG tablet Take 125 mcg by mouth daily.     . metoprolol (TOPROL-XL) 100 MG 24 hr tablet Take 50 mg by mouth 2 (two) times daily.     . nitroGLYCERIN (NITROSTAT) 0.4 MG SL tablet Place 0.4 mg under the tongue every 5 (five) minutes as needed for chest pain. Maximum 3 doses, If no relief call md or 911.    Marland Kitchen oxybutynin (DITROPAN) 5 MG tablet Take 5 mg by mouth at bedtime.     . potassium chloride (KLOR-CON) 10 MEQ tablet Take 1 tablet (10 mEq total) by mouth 2 (two) times daily.    . RABEprazole (ACIPHEX) 20 MG tablet Take 20 mg by mouth daily.      . sertraline  (ZOLOFT) 50 MG tablet Take 50 mg by mouth at bedtime.      . sotalol (BETAPACE) 80 MG tablet Take 80 mg by mouth 2 (two) times daily.    Marland Kitchen torsemide (DEMADEX) 20 MG tablet Take 10 mg by mouth as directed. Take on Tuesday and Friday     No current facility-administered medications for this visit.    OBJECTIVE: BP 113/81 (BP Location: Right Arm, Patient Position: Sitting, Cuff Size: Normal)   Pulse 62   Temp 97.8 F (36.6 C) (Tympanic)   Resp 20   Ht 5' 3"  (1.6 m)   Wt 183 lb 3.2 oz (83.1 kg)   SpO2 97%   BMI 32.45 kg/m    Body mass index is 32.45 kg/m.    ECOG FS:0 - Asymptomatic  General: Well-developed, well-nourished, no acute distress. Eyes: Pink conjunctiva, anicteric sclera. HEENT: Normocephalic, moist mucous membranes. Breast: Exam deferred today. Lungs: No audible wheezing or coughing. Heart: Regular rate and rhythm. Abdomen: Soft, nontender, no obvious distention. Musculoskeletal: No edema, cyanosis, or clubbing. Neuro: Alert, answering all questions appropriately. Cranial nerves grossly intact. Skin: No rashes or petechiae noted. Psych: Normal affect.   LAB RESULTS:   STUDIES: No results found.  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 MammaPrint low risk.  PLAN:    1. 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 MammaPrint was reported as low risk, therefore she did not require adjuvant chemotherapy. She completed adjuvant XRT.  Continue letrozole for a total of 5 years completing treatment in July 2023. Her most recent mammogram on May 01, 2019 was reported as BI-RADS 2, repeat in February 2022.  Patient will have video assisted telemedicine visit in 6 months for routine  evaluation. 2. 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  Patient's most recent CT scan on April 28, 2018 reviewed independently revealed no evidence of progressive or recurrent disease.  No further imaging is necessary unless there is suspicion of progression of disease. 3.  Left renal lesion: CT  scan results from April 28, 2018 revealed a stable 9 x 8 mm cyst which is likely benign. 4.  Bone mineral density: Patient's most recent bone mineral density on November 13, 2019 revealed a T score of -1.7 which is considered osteopenia.  Have recommended patient initiate calcium and vitamin D supplementation.  Repeat in August 2022.   Patient expressed understanding and was in agreement with this plan. She also understands that She can call clinic at any time with any questions, concerns, or complaints.     Lloyd Huger, MD   12/24/2019 4:15 PM

## 2019-12-24 ENCOUNTER — Encounter: Payer: Self-pay | Admitting: Oncology

## 2019-12-24 ENCOUNTER — Other Ambulatory Visit: Payer: Self-pay

## 2019-12-24 ENCOUNTER — Inpatient Hospital Stay: Payer: BC Managed Care – PPO | Attending: Oncology | Admitting: Oncology

## 2019-12-24 VITALS — BP 113/81 | HR 62 | Temp 97.8°F | Resp 20 | Ht 63.0 in | Wt 183.2 lb

## 2019-12-24 DIAGNOSIS — Z79811 Long term (current) use of aromatase inhibitors: Secondary | ICD-10-CM | POA: Insufficient documentation

## 2019-12-24 DIAGNOSIS — Z803 Family history of malignant neoplasm of breast: Secondary | ICD-10-CM | POA: Diagnosis not present

## 2019-12-24 DIAGNOSIS — N289 Disorder of kidney and ureter, unspecified: Secondary | ICD-10-CM | POA: Diagnosis not present

## 2019-12-24 DIAGNOSIS — Z8572 Personal history of non-Hodgkin lymphomas: Secondary | ICD-10-CM | POA: Diagnosis not present

## 2019-12-24 DIAGNOSIS — Z170 Estrogen receptor positive status [ER+]: Secondary | ICD-10-CM | POA: Diagnosis not present

## 2019-12-24 DIAGNOSIS — M8580 Other specified disorders of bone density and structure, unspecified site: Secondary | ICD-10-CM | POA: Diagnosis not present

## 2019-12-24 DIAGNOSIS — C50412 Malignant neoplasm of upper-outer quadrant of left female breast: Secondary | ICD-10-CM | POA: Insufficient documentation

## 2019-12-24 DIAGNOSIS — Z923 Personal history of irradiation: Secondary | ICD-10-CM | POA: Insufficient documentation

## 2019-12-24 NOTE — Progress Notes (Signed)
Patient reports no pain or questions today at 6 month follow up.

## 2020-02-07 ENCOUNTER — Other Ambulatory Visit: Payer: Self-pay

## 2020-02-07 ENCOUNTER — Encounter (INDEPENDENT_AMBULATORY_CARE_PROVIDER_SITE_OTHER): Payer: Medicare Other | Admitting: Ophthalmology

## 2020-02-07 DIAGNOSIS — H43813 Vitreous degeneration, bilateral: Secondary | ICD-10-CM

## 2020-02-07 DIAGNOSIS — H34812 Central retinal vein occlusion, left eye, with macular edema: Secondary | ICD-10-CM

## 2020-02-07 DIAGNOSIS — I10 Essential (primary) hypertension: Secondary | ICD-10-CM

## 2020-02-07 DIAGNOSIS — H35033 Hypertensive retinopathy, bilateral: Secondary | ICD-10-CM

## 2020-02-07 DIAGNOSIS — H348312 Tributary (branch) retinal vein occlusion, right eye, stable: Secondary | ICD-10-CM | POA: Diagnosis not present

## 2020-02-07 DIAGNOSIS — H338 Other retinal detachments: Secondary | ICD-10-CM

## 2020-03-06 ENCOUNTER — Other Ambulatory Visit
Admission: RE | Admit: 2020-03-06 | Discharge: 2020-03-06 | Disposition: A | Discharge status: Home / Self Care | Payer: BC Managed Care – PPO | Source: Ambulatory Visit | Attending: Cardiology | Admitting: Cardiology

## 2020-03-06 DIAGNOSIS — I480 Paroxysmal atrial fibrillation: Secondary | ICD-10-CM | POA: Diagnosis present

## 2020-03-06 DIAGNOSIS — R079 Chest pain, unspecified: Secondary | ICD-10-CM | POA: Insufficient documentation

## 2020-03-06 LAB — TROPONIN I (HIGH SENSITIVITY): Troponin I (High Sensitivity): 8 ng/L (ref <18)

## 2020-03-27 ENCOUNTER — Other Ambulatory Visit: Payer: Self-pay

## 2020-03-27 ENCOUNTER — Encounter (INDEPENDENT_AMBULATORY_CARE_PROVIDER_SITE_OTHER): Payer: BC Managed Care – PPO | Admitting: Ophthalmology

## 2020-03-27 DIAGNOSIS — H34812 Central retinal vein occlusion, left eye, with macular edema: Secondary | ICD-10-CM | POA: Diagnosis not present

## 2020-03-27 DIAGNOSIS — H43813 Vitreous degeneration, bilateral: Secondary | ICD-10-CM

## 2020-03-27 DIAGNOSIS — H348312 Tributary (branch) retinal vein occlusion, right eye, stable: Secondary | ICD-10-CM

## 2020-03-27 DIAGNOSIS — I10 Essential (primary) hypertension: Secondary | ICD-10-CM

## 2020-03-27 DIAGNOSIS — H338 Other retinal detachments: Secondary | ICD-10-CM

## 2020-03-27 DIAGNOSIS — H35033 Hypertensive retinopathy, bilateral: Secondary | ICD-10-CM | POA: Diagnosis not present

## 2020-04-01 ENCOUNTER — Encounter: Payer: Self-pay | Admitting: *Deleted

## 2020-05-15 ENCOUNTER — Encounter (INDEPENDENT_AMBULATORY_CARE_PROVIDER_SITE_OTHER): Payer: Medicare Other | Admitting: Ophthalmology

## 2020-05-15 ENCOUNTER — Other Ambulatory Visit: Payer: Self-pay

## 2020-05-15 DIAGNOSIS — H43813 Vitreous degeneration, bilateral: Secondary | ICD-10-CM

## 2020-05-15 DIAGNOSIS — I10 Essential (primary) hypertension: Secondary | ICD-10-CM

## 2020-05-15 DIAGNOSIS — H35033 Hypertensive retinopathy, bilateral: Secondary | ICD-10-CM

## 2020-05-15 DIAGNOSIS — H338 Other retinal detachments: Secondary | ICD-10-CM

## 2020-05-15 DIAGNOSIS — H34812 Central retinal vein occlusion, left eye, with macular edema: Secondary | ICD-10-CM

## 2020-05-15 DIAGNOSIS — H348312 Tributary (branch) retinal vein occlusion, right eye, stable: Secondary | ICD-10-CM

## 2020-05-19 ENCOUNTER — Ambulatory Visit
Admission: RE | Admit: 2020-05-19 | Discharge: 2020-05-19 | Disposition: A | Discharge status: Home / Self Care | Payer: BC Managed Care – PPO | Source: Ambulatory Visit | Attending: Oncology | Admitting: Oncology

## 2020-05-19 ENCOUNTER — Other Ambulatory Visit: Payer: Self-pay

## 2020-05-19 ENCOUNTER — Other Ambulatory Visit: Payer: Self-pay | Admitting: Oncology

## 2020-05-19 DIAGNOSIS — R928 Other abnormal and inconclusive findings on diagnostic imaging of breast: Secondary | ICD-10-CM

## 2020-05-19 DIAGNOSIS — C50412 Malignant neoplasm of upper-outer quadrant of left female breast: Secondary | ICD-10-CM | POA: Insufficient documentation

## 2020-05-19 DIAGNOSIS — R921 Mammographic calcification found on diagnostic imaging of breast: Secondary | ICD-10-CM

## 2020-05-29 ENCOUNTER — Other Ambulatory Visit: Payer: Self-pay

## 2020-05-29 ENCOUNTER — Ambulatory Visit
Admission: RE | Admit: 2020-05-29 | Discharge: 2020-05-29 | Disposition: A | Discharge status: Home / Self Care | Payer: BC Managed Care – PPO | Source: Ambulatory Visit | Attending: Oncology | Admitting: Oncology

## 2020-05-29 DIAGNOSIS — R921 Mammographic calcification found on diagnostic imaging of breast: Secondary | ICD-10-CM | POA: Diagnosis present

## 2020-05-29 DIAGNOSIS — R928 Other abnormal and inconclusive findings on diagnostic imaging of breast: Secondary | ICD-10-CM | POA: Insufficient documentation

## 2020-05-29 HISTORY — PX: EYE SURGERY: SHX253

## 2020-05-29 HISTORY — PX: BREAST BIOPSY: SHX20

## 2020-05-30 LAB — SURGICAL PATHOLOGY

## 2020-06-19 NOTE — Progress Notes (Signed)
  [facility]  [phone]  Fax:([phone]) [place] DOB: March 30, 1945  MR#: [id number]  MWU#:[id number]  Patient Care Team: Rusty Aus, MD as PCP - General (Internal Medicine)     Lloyd Huger, MD   06/26/2020 6:38 AM   This encounter was created in error - please disregard.

## 2020-06-24 ENCOUNTER — Inpatient Hospital Stay: Payer: BC Managed Care – PPO | Admitting: Oncology

## 2020-06-24 ENCOUNTER — Encounter: Payer: Self-pay | Admitting: Oncology

## 2020-06-24 DIAGNOSIS — C884 Extranodal marginal zone B-cell lymphoma of mucosa-associated lymphoid tissue [MALT-lymphoma]: Secondary | ICD-10-CM

## 2020-06-24 DIAGNOSIS — C50412 Malignant neoplasm of upper-outer quadrant of left female breast: Secondary | ICD-10-CM

## 2020-06-28 NOTE — Progress Notes (Signed)
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 MammaPrint low risk.  INTERVAL HISTORY: Patient returns to clinic today for routine 59-monthevaluation.  She continues to tolerate letrozole well without significant side effects.  She currently feels well and is asymptomatic. She has no neurologic complaints. She has noted no new lymphadenopathy.  She denies any chest pain, shortness of breath, cough, or hemoptysis.  She denies any nausea, vomiting, constipation, or diarrhea. She has no urinary complaints.  Patient offers no specific complaints today.  REVIEW OF SYSTEMS:   Review of Systems  Constitutional: Negative for chills, diaphoresis, fever, malaise/fatigue and weight loss.  Respiratory: Negative.  Negative for cough and shortness of breath.   Cardiovascular: Negative.  Negative for chest pain and leg swelling.  Gastrointestinal: Negative.  Negative for abdominal pain.  Genitourinary: Negative.  Negative for dysuria.  Musculoskeletal: Negative.  Negative for back pain.  Skin: Negative.  Negative for rash.  Neurological: Negative.  Negative for sensory change, focal weakness, weakness and headaches.  Psychiatric/Behavioral: Negative.  The patient is not nervous/anxious.     As per HPI. Otherwise, a complete review of systems is negative.  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 MammaPrint was reported as low risk, therefore she did not  require adjuvant chemotherapy. She completed adjuvant XRT. Will complete 5 year of letrozole in July 2023.   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  Patient's most recent CT scan on April 28, 2018 revealed no evidence of progressive or recurrent disease.    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[facility]  07/05/2014 Initial Diagnosis   MALT (mucosa associated lymphoid tissue)   Primary cancer of upper outer quadrant of left female breast ([facility]  05/09/2016 Initial Diagnosis   Primary cancer of upper outer quadrant of left female breast ([facility]     PAST MEDICAL HISTORY: Past Medical History:  Diagnosis Date  . Anginal pain ([facility]   . Arthritis t   knee  . Asthma    mild  . Breast cancer ([facility]   . 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 [place] . Depression   . Dyspnea   . Dysrhythmia   . GERD (gastroesophageal reflux disease)   . Hypertension   . Hypothyroidism   . MALT (mucosa associated lymphoid tissue) ([facility] 07/05/2014   Right neck mass resected 01/2014.  .Marland KitchenNo pertinent past medical history   . Personal history of radiation therapy   . Sleep apnea t   O2- 2l at bedtime and BiPap @ bedtime    PAST SURGICAL HISTORY: Past Surgical History:  Procedure Laterality Date  . BREAST BIOPSY Left 2/136/2018   INVASIVE MAMMARY CARCINOMA  . BREAST BIOPSY Right 05/25/2016   FIBROCYSTIC CHANGE WITH CALCIFICATIONS   . BREAST BIOPSY Right 05/29/2020   Affirm bx-"X" clip-path pending  . BREAST EXCISIONAL BIOPSY Left 06/04/2016   INVASIVE MAMMARY CARCINOMA.   .Marland Kitchen  BREAST LUMPECTOMY Left 06/04/2016   INVASIVE MAMMARY CARCINOMA.   Marland Kitchen CARDIAC CATHETERIZATION    . CARDIAC CATHETERIZATION N/A 09/24/2015   Procedure: Left Heart Cath and Coronary Angiography;  Surgeon: Isaias Cowman, MD;  Location: [place] [facility];  Service: Cardiovascular;  Laterality: N/A;  . CARDIAC CATHETERIZATION N/A  09/24/2015   Procedure: Coronary Stent Intervention;  Surgeon: Isaias Cowman, MD;  Location: [place] [facility];  Service: Cardiovascular;  Laterality: N/A;  . CHOLECYSTECTOMY  11/26   08/1970  . DILATATION & CURETTAGE/HYSTEROSCOPY WITH MYOSURE N/A 05/05/2015   Procedure: Hysteroscopy and endometrial curretage;  Surgeon: Boykin Nearing, MD;  Location: [facility];  Service: Gynecology;  Laterality: N/A;  . DILATION AND CURETTAGE OF UTERUS    . EXCISION MASS FROM NECK  Right    Dr. Tami Ribas  . EYE SURGERY  t   bil cataract 9/08  . HAMMER TOE SURGERY Right   . JOINT REPLACEMENT Bilateral 2008, 11/26/ 2012   Partial Knee Replacements  . LEFT HEART CATH AND CORONARY ANGIOGRAPHY N/A 10/25/2017   Procedure: LEFT HEART CATH AND CORONARY ANGIOGRAPHY;  Surgeon: Teodoro Spray, MD;  Location: [place] [facility];  Service: Cardiovascular;  Laterality: N/A;  . LEFT HEART CATH AND CORONARY ANGIOGRAPHY N/A 10/26/2017   Procedure: LEFT HEART CATH AND CORONARY ANGIOGRAPHY;  Surgeon: Isaias Cowman, MD;  Location: [place] [facility];  Service: Cardiovascular;  Laterality: N/A;  . PARTIAL MASTECTOMY WITH NEEDLE LOCALIZATION Left 06/04/2016   Procedure: PARTIAL MASTECTOMY WITH NEEDLE LOCALIZATION;  Surgeon: Leonie Green, MD;  Location: [facility];  Service: General;  Laterality: Left;  . SCLERAL BUCKLE  02/16/2011   Procedure: SCLERAL BUCKLE;  Surgeon: Hayden Pedro, MD;  Location: [facility];  Service: Ophthalmology;  Laterality: Left;  Scleral Buckle Left Eye with Headscope Laser  . SENTINEL NODE BIOPSY Left 06/04/2016   Procedure: SENTINEL NODE BIOPSY;  Surgeon: Leonie Green, MD;  Location: [facility];  Service: General;  Laterality: Left;    FAMILY HISTORY Family History  Problem Relation Age of Onset  . Breast cancer Mother 45  . Heart disease Father   . Breast cancer Paternal Aunt 73  . Breast cancer Cousin   . Anesthesia problems Neg Hx   . Hypotension Neg Hx   . Malignant  hyperthermia Neg Hx   . Pseudochol deficiency Neg Hx     GYNECOLOGIC HISTORY:  No LMP recorded. Patient is postmenopausal.     ADVANCED DIRECTIVES:    HEALTH MAINTENANCE: Social History   Tobacco Use  . Smoking status: Never Smoker  . Smokeless tobacco: Never Used  Vaping Use  . Vaping Use: Never used  Substance Use Topics  . Alcohol use: No  . Drug use: No     Colonoscopy:  PAP:  Bone density:  Mammogram: November 2016  Allergies  Allergen Reactions  . Codeine Other (See Comments)    Stroke like symptoms    Current Outpatient Medications  Medication Sig Dispense Refill  . acidophilus (RISAQUAD) CAPS capsule Take 1 capsule by mouth daily.    Marland Kitchen amLODipine (NORVASC) 5 MG tablet Take 5 mg by mouth daily.    Marland Kitchen apixaban (ELIQUIS) 5 MG TABS tablet Take 5 mg by mouth 2 (two) times daily.    Marland Kitchen aspirin 81 MG EC tablet Take 81 mg by mouth daily.     Marland Kitchen atorvastatin (LIPITOR) 40 MG tablet Take 1 tablet (40 mg total) by mouth at bedtime.    Marland Kitchen diltiazem (CARDIZEM)  30 MG tablet Take 30 mg by mouth 2 (two) times daily as needed (for breakthrough afib).     . Fluticasone-Salmeterol (ADVAIR) 100-50 MCG/DOSE AEPB Inhale 1 puff into the lungs 2 (two) times daily as needed (for asthma.).    Marland Kitchen isosorbide mononitrate (IMDUR) 30 MG 24 hr tablet Take 30 mg by mouth daily.    Marland Kitchen letrozole (FEMARA) 2.5 MG tablet TAKE 1 TABLET BY MOUTH ONCE DAILY 90 tablet 3  . levothyroxine (SYNTHROID) 125 MCG tablet Take 125 mcg by mouth daily.    . metoprolol (TOPROL-XL) 100 MG 24 hr tablet Take 50 mg by mouth 2 (two) times daily.    . nitroGLYCERIN (NITROSTAT) 0.4 MG SL tablet Place 0.4 mg under the tongue every 5 (five) minutes as needed for chest pain. Maximum 3 doses, If no relief call md or 911.    Marland Kitchen oxybutynin (DITROPAN) 5 MG tablet Take 5 mg by mouth at bedtime.     . potassium chloride (KLOR-CON) 10 MEQ tablet Take 1 tablet (10 mEq total) by mouth 2 (two) times daily.    . RABEprazole (ACIPHEX) 20 MG  tablet Take 20 mg by mouth daily.    . sertraline (ZOLOFT) 50 MG tablet Take 50 mg by mouth at bedtime.    . sotalol (BETAPACE) 80 MG tablet Take 80 mg by mouth 2 (two) times daily.    Marland Kitchen torsemide (DEMADEX) 20 MG tablet Take 10 mg by mouth as directed. Take on Tuesday and Friday     No current facility-administered medications for this visit.    OBJECTIVE: BP 120/77   Pulse 63   Temp 99 F (37.2 C) (Tympanic)   Wt 188 lb 6.4 oz (85.5 kg)   SpO2 97%   BMI 33.37 kg/m    Body mass index is 33.37 kg/m.    ECOG FS:0 - Asymptomatic  General: Well-developed, well-nourished, no acute distress. Eyes: Pink conjunctiva, anicteric sclera. HEENT: Normocephalic, moist mucous membranes. Lungs: No audible wheezing or coughing. Heart: Regular rate and rhythm. Abdomen: Soft, nontender, no obvious distention. Musculoskeletal: No edema, cyanosis, or clubbing. Neuro: Alert, answering all questions appropriately. Cranial nerves grossly intact. Skin: No rashes or petechiae noted. Psych: Normal affect.  LAB RESULTS:   STUDIES: No results found.  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 MammaPrint low risk.  PLAN:    1. 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 MammaPrint was reported as low risk, therefore she did not require adjuvant chemotherapy. She completed adjuvant XRT.  Continue letrozole for a total of 5 years completing treatment in July 2023.  Patient underwent biopsy of calcifications in her right breast which were negative for malignancy.  Mammogram on May 19, 2020 on the left breast was reported as stable.  Repeat in February 2023.  Return to clinic in 6 months for routine evaluation. 2. MALT lymphoma:  Patient is status post excision of a right submandibular gland on February 05, 2014.  Patient's most recent CT scan on April 28, 2018 reviewed independently revealed no evidence of progressive or recurrent disease.  No further imaging is necessary unless there is suspicion of progression of disease. 3.  Left renal lesion: CT scan results from April 28, 2018 revealed a stable 9 x 8  mm cyst which is likely benign. 4.  Bone mineral density: Patient's most recent bone mineral density on November 13, 2019 revealed a T score of -1.7 which is considered osteopenia.  Continue calcium and vitamin D supplementation.  Repeat in August 2022.  Patient expressed understanding and was in agreement with this plan. She also understands that She can call clinic at any time with any questions, concerns, or complaints.     Lloyd Huger, MD   07/03/2020 7:33 AM

## 2020-07-02 ENCOUNTER — Encounter: Payer: Self-pay | Admitting: Oncology

## 2020-07-02 ENCOUNTER — Inpatient Hospital Stay: Payer: BC Managed Care – PPO | Attending: Oncology | Admitting: Oncology

## 2020-07-02 ENCOUNTER — Other Ambulatory Visit: Payer: Self-pay

## 2020-07-02 VITALS — BP 120/77 | HR 63 | Temp 99.0°F | Wt 188.4 lb

## 2020-07-02 DIAGNOSIS — Z8572 Personal history of non-Hodgkin lymphomas: Secondary | ICD-10-CM | POA: Diagnosis not present

## 2020-07-02 DIAGNOSIS — Z853 Personal history of malignant neoplasm of breast: Secondary | ICD-10-CM

## 2020-07-02 DIAGNOSIS — Z170 Estrogen receptor positive status [ER+]: Secondary | ICD-10-CM | POA: Insufficient documentation

## 2020-07-02 DIAGNOSIS — C50412 Malignant neoplasm of upper-outer quadrant of left female breast: Secondary | ICD-10-CM | POA: Insufficient documentation

## 2020-07-02 DIAGNOSIS — Z79811 Long term (current) use of aromatase inhibitors: Secondary | ICD-10-CM | POA: Insufficient documentation

## 2020-07-02 DIAGNOSIS — M810 Age-related osteoporosis without current pathological fracture: Secondary | ICD-10-CM | POA: Diagnosis not present

## 2020-07-02 DIAGNOSIS — N289 Disorder of kidney and ureter, unspecified: Secondary | ICD-10-CM | POA: Insufficient documentation

## 2020-07-03 ENCOUNTER — Encounter (INDEPENDENT_AMBULATORY_CARE_PROVIDER_SITE_OTHER): Payer: Medicare Other | Admitting: Ophthalmology

## 2020-07-03 DIAGNOSIS — H34812 Central retinal vein occlusion, left eye, with macular edema: Secondary | ICD-10-CM | POA: Diagnosis not present

## 2020-07-03 DIAGNOSIS — I10 Essential (primary) hypertension: Secondary | ICD-10-CM

## 2020-07-03 DIAGNOSIS — H338 Other retinal detachments: Secondary | ICD-10-CM

## 2020-07-03 DIAGNOSIS — H35033 Hypertensive retinopathy, bilateral: Secondary | ICD-10-CM

## 2020-07-03 DIAGNOSIS — H34831 Tributary (branch) retinal vein occlusion, right eye, with macular edema: Secondary | ICD-10-CM

## 2020-07-03 DIAGNOSIS — H43813 Vitreous degeneration, bilateral: Secondary | ICD-10-CM

## 2020-08-08 ENCOUNTER — Other Ambulatory Visit: Payer: Self-pay | Admitting: Oncology

## 2020-08-21 ENCOUNTER — Other Ambulatory Visit: Payer: Self-pay

## 2020-08-21 ENCOUNTER — Encounter (INDEPENDENT_AMBULATORY_CARE_PROVIDER_SITE_OTHER): Payer: BC Managed Care – PPO | Admitting: Ophthalmology

## 2020-08-21 DIAGNOSIS — H43813 Vitreous degeneration, bilateral: Secondary | ICD-10-CM

## 2020-08-21 DIAGNOSIS — H34831 Tributary (branch) retinal vein occlusion, right eye, with macular edema: Secondary | ICD-10-CM | POA: Diagnosis not present

## 2020-08-21 DIAGNOSIS — I10 Essential (primary) hypertension: Secondary | ICD-10-CM

## 2020-08-21 DIAGNOSIS — H338 Other retinal detachments: Secondary | ICD-10-CM

## 2020-08-21 DIAGNOSIS — H34812 Central retinal vein occlusion, left eye, with macular edema: Secondary | ICD-10-CM

## 2020-08-21 DIAGNOSIS — H35033 Hypertensive retinopathy, bilateral: Secondary | ICD-10-CM | POA: Diagnosis not present

## 2020-10-02 ENCOUNTER — Other Ambulatory Visit: Payer: Self-pay

## 2020-10-02 ENCOUNTER — Encounter (INDEPENDENT_AMBULATORY_CARE_PROVIDER_SITE_OTHER): Payer: BC Managed Care – PPO | Admitting: Ophthalmology

## 2020-10-02 DIAGNOSIS — H35033 Hypertensive retinopathy, bilateral: Secondary | ICD-10-CM | POA: Diagnosis not present

## 2020-10-02 DIAGNOSIS — H43813 Vitreous degeneration, bilateral: Secondary | ICD-10-CM

## 2020-10-02 DIAGNOSIS — H34812 Central retinal vein occlusion, left eye, with macular edema: Secondary | ICD-10-CM

## 2020-10-02 DIAGNOSIS — I10 Essential (primary) hypertension: Secondary | ICD-10-CM | POA: Diagnosis not present

## 2020-10-02 DIAGNOSIS — H348312 Tributary (branch) retinal vein occlusion, right eye, stable: Secondary | ICD-10-CM

## 2020-10-23 ENCOUNTER — Ambulatory Visit: Payer: BC Managed Care – PPO | Admitting: Radiation Oncology

## 2020-11-06 ENCOUNTER — Encounter: Payer: Self-pay | Admitting: Radiation Oncology

## 2020-11-06 ENCOUNTER — Ambulatory Visit
Admission: RE | Admit: 2020-11-06 | Discharge: 2020-11-06 | Disposition: A | Discharge status: Home / Self Care | Payer: BC Managed Care – PPO | Source: Ambulatory Visit | Attending: Radiation Oncology | Admitting: Radiation Oncology

## 2020-11-06 VITALS — BP 150/88 | HR 64 | Temp 96.7°F | Resp 18 | Wt 185.6 lb

## 2020-11-06 DIAGNOSIS — Z923 Personal history of irradiation: Secondary | ICD-10-CM | POA: Diagnosis not present

## 2020-11-06 DIAGNOSIS — C50412 Malignant neoplasm of upper-outer quadrant of left female breast: Secondary | ICD-10-CM | POA: Insufficient documentation

## 2020-11-06 DIAGNOSIS — Z79811 Long term (current) use of aromatase inhibitors: Secondary | ICD-10-CM | POA: Diagnosis not present

## 2020-11-06 DIAGNOSIS — Z170 Estrogen receptor positive status [ER+]: Secondary | ICD-10-CM | POA: Diagnosis not present

## 2020-11-06 NOTE — Progress Notes (Signed)
Radiation Oncology Follow up Note  Name: Stacey Stone   Date:   11/06/2020 MRN:  [id number] DOB: 1945/09/23    This 75 y.o. female presents to the clinic today for 4-year follow-up status post whole breast radiation to her left breast for ER/PR positive invasive mammary carcinoma.  REFERRING PROVIDER: Rusty Aus, MD  HPI: Patient is a 75 year old female now at 4 years having completed whole breast radiation to her left breast for ER/PR positive invasive mammary carcinoma.  Seen today in routine follow-up she is doing well specifically denies breast tenderness cough or bone pain..  She had mammograms back in February showing indeterminate calcifications of the right breast.  Left breast was stable.  She underwent stereotactic guided core biopsy showing fibroadenoma with associated calcifications negative for atypia or malignancy.  She is currently on Femara tolerant at well without side effect.  COMPLICATIONS OF TREATMENT: none  FOLLOW UP COMPLIANCE: keeps appointments   PHYSICAL EXAM:  BP (!) 150/88 (BP Location: Left Arm, Patient Position: Sitting)   Pulse 64   Temp (!) 96.7 F (35.9 C) (Tympanic)   Resp 18   Wt 185 lb 9.6 oz (84.2 kg)   BMI 32.88 kg/m  Lungs are clear to A&P cardiac examination essentially unremarkable with regular rate and rhythm. No dominant mass or nodularity is noted in either breast in 2 positions examined. Incision is well-healed. No axillary or supraclavicular adenopathy is appreciated. Cosmetic result is excellent.  Well-developed well-nourished patient in NAD. HEENT reveals PERLA, EOMI, discs not visualized.  Oral cavity is clear. No oral mucosal lesions are identified. Neck is clear without evidence of cervical or supraclavicular adenopathy. Lungs are clear to A&P. Cardiac examination is essentially unremarkable with regular rate and rhythm without murmur rub or thrill. Abdomen is benign with no organomegaly or masses noted. Motor sensory and DTR  levels are equal and symmetric in the upper and lower extremities. Cranial nerves II through XII are grossly intact. Proprioception is intact. No peripheral adenopathy or edema is identified. No motor or sensory levels are noted. Crude visual fields are within normal range.  RADIOLOGY RESULTS: Mammograms and pathology report reviewed compatible with above-stated findings  PLAN: Present time she continues to do well with no evidence of disease.  Of asked to see her back in 1 year for follow-up.  I will then discontinue follow-up care since will be 5 years out.  She continues on Femara without side effect.  Patient knows to call with any concerns.  I would like to take this opportunity to thank you for allowing me to participate in the care of your patient.Noreene Filbert, MD

## 2020-11-13 ENCOUNTER — Encounter (INDEPENDENT_AMBULATORY_CARE_PROVIDER_SITE_OTHER): Payer: BC Managed Care – PPO | Admitting: Ophthalmology

## 2020-11-13 ENCOUNTER — Ambulatory Visit
Admission: RE | Admit: 2020-11-13 | Discharge: 2020-11-13 | Disposition: A | Discharge status: Home / Self Care | Payer: BC Managed Care – PPO | Source: Ambulatory Visit | Attending: Oncology | Admitting: Oncology

## 2020-11-13 ENCOUNTER — Other Ambulatory Visit: Payer: Self-pay

## 2020-11-13 DIAGNOSIS — H35033 Hypertensive retinopathy, bilateral: Secondary | ICD-10-CM | POA: Diagnosis not present

## 2020-11-13 DIAGNOSIS — H34812 Central retinal vein occlusion, left eye, with macular edema: Secondary | ICD-10-CM

## 2020-11-13 DIAGNOSIS — I10 Essential (primary) hypertension: Secondary | ICD-10-CM

## 2020-11-13 DIAGNOSIS — Z853 Personal history of malignant neoplasm of breast: Secondary | ICD-10-CM | POA: Insufficient documentation

## 2020-11-13 DIAGNOSIS — H348312 Tributary (branch) retinal vein occlusion, right eye, stable: Secondary | ICD-10-CM

## 2020-11-13 DIAGNOSIS — H43813 Vitreous degeneration, bilateral: Secondary | ICD-10-CM

## 2020-11-13 DIAGNOSIS — H338 Other retinal detachments: Secondary | ICD-10-CM

## 2020-12-15 ENCOUNTER — Emergency Department
Admission: EM | Admit: 2020-12-15 | Discharge: 2020-12-15 | Disposition: A | Discharge status: Home / Self Care | Payer: BC Managed Care – PPO | Attending: Emergency Medicine | Admitting: Emergency Medicine

## 2020-12-15 ENCOUNTER — Emergency Department: Payer: BC Managed Care – PPO

## 2020-12-15 ENCOUNTER — Other Ambulatory Visit: Payer: Self-pay

## 2020-12-15 DIAGNOSIS — I2510 Atherosclerotic heart disease of native coronary artery without angina pectoris: Secondary | ICD-10-CM | POA: Diagnosis not present

## 2020-12-15 DIAGNOSIS — Z853 Personal history of malignant neoplasm of breast: Secondary | ICD-10-CM | POA: Insufficient documentation

## 2020-12-15 DIAGNOSIS — K6389 Other specified diseases of intestine: Secondary | ICD-10-CM

## 2020-12-15 DIAGNOSIS — J45909 Unspecified asthma, uncomplicated: Secondary | ICD-10-CM | POA: Diagnosis not present

## 2020-12-15 DIAGNOSIS — K219 Gastro-esophageal reflux disease without esophagitis: Secondary | ICD-10-CM | POA: Insufficient documentation

## 2020-12-15 DIAGNOSIS — Z96653 Presence of artificial knee joint, bilateral: Secondary | ICD-10-CM | POA: Insufficient documentation

## 2020-12-15 DIAGNOSIS — K3580 Unspecified acute appendicitis: Secondary | ICD-10-CM | POA: Diagnosis not present

## 2020-12-15 DIAGNOSIS — Z7982 Long term (current) use of aspirin: Secondary | ICD-10-CM | POA: Insufficient documentation

## 2020-12-15 DIAGNOSIS — R1032 Left lower quadrant pain: Secondary | ICD-10-CM | POA: Diagnosis present

## 2020-12-15 DIAGNOSIS — E039 Hypothyroidism, unspecified: Secondary | ICD-10-CM | POA: Insufficient documentation

## 2020-12-15 DIAGNOSIS — Z7901 Long term (current) use of anticoagulants: Secondary | ICD-10-CM | POA: Diagnosis not present

## 2020-12-15 DIAGNOSIS — Z79899 Other long term (current) drug therapy: Secondary | ICD-10-CM | POA: Insufficient documentation

## 2020-12-15 LAB — COMPREHENSIVE METABOLIC PANEL
ALT: 36 U/L (ref 0–44)
AST: 41 U/L (ref 15–41)
Albumin: 3.6 g/dL (ref 3.5–5.0)
Alkaline Phosphatase: 202 U/L — ABNORMAL HIGH (ref 38–126)
Anion gap: 7 (ref 5–15)
BUN: 14 mg/dL (ref 8–23)
CO2: 28 mmol/L (ref 22–32)
Calcium: 9.4 mg/dL (ref 8.9–10.3)
Chloride: 102 mmol/L (ref 98–111)
Creatinine, Ser: 0.87 mg/dL (ref 0.44–1.00)
GFR, Estimated: >60 mL/min (ref >60)
Glucose, Bld: 104 mg/dL — ABNORMAL HIGH (ref 70–99)
Potassium: 3.6 mmol/L (ref 3.5–5.1)
Sodium: 137 mmol/L (ref 135–145)
Total Bilirubin: 0.8 mg/dL (ref 0.3–1.2)
Total Protein: 7.5 g/dL (ref 6.5–8.1)

## 2020-12-15 LAB — URINALYSIS, COMPLETE (UACMP) WITH MICROSCOPIC
Bacteria, UA: NONE SEEN
Bilirubin Urine: NEGATIVE
Glucose, UA: NEGATIVE mg/dL
Ketones, ur: NEGATIVE mg/dL
Leukocytes,Ua: NEGATIVE
Nitrite: NEGATIVE
Protein, ur: NEGATIVE mg/dL
Specific Gravity, Urine: 1.018 (ref 1.005–1.030)
pH: 7 (ref 5.0–8.0)

## 2020-12-15 LAB — CBC
HCT: 40.7 % (ref 36.0–46.0)
Hemoglobin: 13.4 g/dL (ref 12.0–15.0)
MCH: 26.3 pg (ref 26.0–34.0)
MCHC: 32.9 g/dL (ref 30.0–36.0)
MCV: 80 fL (ref 80.0–100.0)
Platelets: 274 10*3/uL (ref 150–400)
RBC: 5.09 MIL/uL (ref 3.87–5.11)
RDW: 15.9 % — ABNORMAL HIGH (ref 11.5–15.5)
WBC: 10.4 10*3/uL (ref 4.0–10.5)
nRBC: 0 % (ref 0.0–0.2)

## 2020-12-15 LAB — LIPASE, BLOOD: Lipase: 26 U/L (ref 11–51)

## 2020-12-15 MED ORDER — TRAMADOL HCL 50 MG PO TABS: 50.0000 mg | ORAL_TABLET | Freq: Four times a day (QID) | ORAL | 0 refills | Status: AC | PRN

## 2020-12-15 NOTE — ED Notes (Signed)
Pt c/o of left lower abdominal pain that started last night. Pt c/o nausea but no vomiting or diarrhea. Pt VSS and NAD.

## 2020-12-15 NOTE — ED Notes (Signed)
Pt declines pain medication.

## 2020-12-15 NOTE — ED Provider Notes (Signed)
[facility] Emergency Department Provider Note ____________________________________________   Event Date/Time   First MD Initiated Contact with Patient 12/15/20 1112     (approximate)  I have reviewed the triage vital signs and the nursing notes.   HISTORY  Chief Complaint Abdominal Pain    HPI Stacey Stone is a 75 y.o. female with PMH as noted below who presents with left lower abdominal pain over the last several days, persistent course, associated with nausea but no vomiting.  The patient denies any diarrhea or blood in the stool.  She denies dysuria or frequency.  She denies any prior history of this pain.  Past Medical History:  Diagnosis Date   Anginal pain ([place])    Arthritis t   knee   Asthma    mild   Breast cancer ([place])    Coronary artery disease T   1 artery 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 Mamie Nick. at [facility] in [place]   Depression    Dyspnea    Dysrhythmia    GERD (gastroesophageal reflux disease)    Hypertension    Hypothyroidism    MALT (mucosa associated lymphoid tissue) ([place]) 07/05/2014   Right neck mass resected 01/2014.   No pertinent past medical history    Personal history of radiation therapy    Sleep apnea t   O2- 2l at bedtime and BiPap @ bedtime    Patient Active Problem List   Diagnosis Date Noted   Peripheral edema 07/13/2019   Sepsis ([place]) 05/24/2019   Cellulitis of right lower extremity 05/24/2019   Bilateral edema of lower extremity 05/24/2019   Status post ablation of atrial fibrillation 02/08/2018   PMB (postmenopausal bleeding) 06/17/2016   Preoperative cardiovascular examination 05/13/2016   Primary cancer of upper outer quadrant of left female breast ([place]) 05/09/2016   Unstable angina ([place]) 09/24/2015   S/P cardiac catheterization 09/24/2015   History of total right knee replacement 06/25/2015   B12 deficiency 01/15/2015   Vitamin D deficiency 01/15/2015   Paroxysmal atrial fibrillation  ([place]) 12/15/2014   Aortic valve disorder 11/06/2014   Hypersomnia with sleep apnea 11/06/2014   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   OSA on CPAP 01/02/2014   Angina at rest [facility]) 06/16/2013   Essential hypertension 06/16/2013   Hypercholesterolemia 06/16/2013   Hypothyroidism 06/16/2013   Rhegmatogenous retinal detachment of left eye 02/16/2011   CAD S/P percutaneous coronary angioplasty 01/08/2006    Past Surgical History:  Procedure Laterality Date   BREAST BIOPSY Left 2/136/2018   INVASIVE MAMMARY CARCINOMA   BREAST BIOPSY Right 05/25/2016   FIBROCYSTIC CHANGE WITH CALCIFICATIONS    BREAST BIOPSY Right 05/29/2020   Affirm bx-"X" clip-path pending   BREAST EXCISIONAL BIOPSY Left 06/04/2016   INVASIVE MAMMARY CARCINOMA.    BREAST LUMPECTOMY Left 06/04/2016   INVASIVE MAMMARY CARCINOMA.    CARDIAC CATHETERIZATION     CARDIAC CATHETERIZATION N/A 09/24/2015   Procedure: Left Heart Cath and Coronary Angiography;  Surgeon: Isaias Cowman, MD;  Location: [place] CV LAB;  Service: Cardiovascular;  Laterality: N/A;   CARDIAC CATHETERIZATION N/A 09/24/2015   Procedure: Coronary Stent Intervention;  Surgeon: Isaias Cowman, MD;  Location: [place] [facility];  Service: Cardiovascular;  Laterality: N/A;   CHOLECYSTECTOMY  11/26   08/1970   DILATATION & CURETTAGE/HYSTEROSCOPY WITH MYOSURE N/A 05/05/2015   Procedure: Hysteroscopy and endometrial curretage;  Surgeon: Boykin Nearing, MD;  Location: [facility] ORS;  Service: Gynecology;  Laterality: N/A;   DILATION AND CURETTAGE OF UTERUS  EXCISION MASS FROM NECK  Right    Dr. Tami Ribas   EYE SURGERY  t   bil cataract 9/08   HAMMER TOE SURGERY Right    JOINT REPLACEMENT Bilateral 2008, 11/26/ 2012   Partial Knee Replacements   LEFT HEART CATH AND CORONARY ANGIOGRAPHY N/A 10/25/2017   Procedure: LEFT HEART CATH AND CORONARY ANGIOGRAPHY;  Surgeon: Teodoro Spray, MD;  Location: [place] CV [facility];  Service: Cardiovascular;   Laterality: N/A;   LEFT HEART CATH AND CORONARY ANGIOGRAPHY N/A 10/26/2017   Procedure: LEFT HEART CATH AND CORONARY ANGIOGRAPHY;  Surgeon: Isaias Cowman, MD;  Location: [place] [facility];  Service: Cardiovascular;  Laterality: N/A;   PARTIAL MASTECTOMY WITH NEEDLE LOCALIZATION Left 06/04/2016   Procedure: PARTIAL MASTECTOMY WITH NEEDLE LOCALIZATION;  Surgeon: Leonie Green, MD;  Location: [facility] ORS;  Service: General;  Laterality: Left;   SCLERAL BUCKLE  02/16/2011   Procedure: SCLERAL BUCKLE;  Surgeon: Hayden Pedro, MD;  Location: [place];  Service: Ophthalmology;  Laterality: Left;  Scleral Buckle Left Eye with Headscope Laser   SENTINEL NODE BIOPSY Left 06/04/2016   Procedure: SENTINEL NODE BIOPSY;  Surgeon: Leonie Green, MD;  Location: [facility];  Service: General;  Laterality: Left;    Prior to Admission medications   Medication Sig Start Date End Date Taking? Authorizing Provider  amLODipine (NORVASC) 5 MG tablet Take 5 mg by mouth daily. 09/12/19  Yes [provider]  apixaban (ELIQUIS) 5 MG TABS tablet Take 5 mg by mouth 2 (two) times daily.   Yes [provider]  aspirin 81 MG EC tablet Take 81 mg by mouth daily.    Yes [provider]  atorvastatin (LIPITOR) 40 MG tablet Take 1 tablet (40 mg total) by mouth at bedtime. 05/29/19  Yes Enzo Bi, MD  diltiazem (CARDIZEM) 30 MG tablet Take 30 mg by mouth 2 (two) times daily as needed (for breakthrough afib).    Yes [provider]  Fluticasone-Salmeterol (ADVAIR) 100-50 MCG/DOSE AEPB Inhale 1 puff into the lungs 2 (two) times daily as needed (for asthma.).   Yes [provider]  isosorbide mononitrate (IMDUR) 30 MG 24 hr tablet Take 30 mg by mouth daily.   Yes [provider]  letrozole (FEMARA) 2.5 MG tablet TAKE 1 TABLET BY MOUTH ONCE DAILY 08/11/20  Yes Lloyd Huger, MD  levothyroxine (SYNTHROID) 125 MCG tablet Take 125 mcg by mouth daily.   Yes [provider]  oxybutynin (DITROPAN) 5 MG tablet Take 5 mg by mouth at bedtime.    Yes [provider]  potassium chloride (KLOR-CON) 10 MEQ tablet Take 1 tablet (10 mEq total) by mouth 2 (two) times daily. 05/29/19  Yes Enzo Bi, MD  RABEprazole (ACIPHEX) 20 MG tablet Take 20 mg by mouth daily.   Yes [provider]  sertraline (ZOLOFT) 50 MG tablet Take 50 mg by mouth at bedtime.   Yes [provider]  torsemide (DEMADEX) 20 MG tablet Take 10 mg by mouth as directed. Take on Tuesday and Friday   Yes [provider]  traMADol (ULTRAM) 50 MG tablet Take 1 tablet (50 mg total) by mouth every 6 (six) hours as needed for up to 5 days for moderate pain. 12/15/20 12/20/20 Yes Arta Silence, MD  acidophilus (RISAQUAD) CAPS capsule Take 1 capsule by mouth daily. Patient not taking: Reported on 12/15/2020    [provider]  metoprolol (TOPROL-XL) 100 MG 24 hr tablet Take 50 mg by  mouth 2 (two) times daily. Patient not taking: Reported on 12/15/2020    [provider]  nitroGLYCERIN (NITROSTAT) 0.4 MG SL tablet Place 0.4 mg under the tongue every 5 (five) minutes as needed for chest pain. Maximum 3 doses, If no relief call md or 911.    [provider]  sotalol (BETAPACE) 80 MG tablet Take 80 mg by mouth 2 (two) times daily. Patient not taking: No sig reported 09/12/19   [provider]    Allergies Codeine  Family History  Problem Relation Age of Onset   Breast cancer Mother 77   Heart disease Father    Breast cancer Paternal Aunt 73   Breast cancer Cousin    Anesthesia problems Neg Hx    Hypotension Neg Hx    Malignant hyperthermia Neg Hx    Pseudochol deficiency Neg Hx     Social History Social History   Tobacco Use   Smoking status: Never   Smokeless tobacco: Never  Vaping Use   Vaping Use: Never used  Substance Use Topics   Alcohol use: No   Drug use: No    Review of Systems  Constitutional: No  fever/chills Eyes: No visual changes. ENT: No sore throat. Cardiovascular: Denies chest pain. Respiratory: Denies shortness of breath. Gastrointestinal: Positive for nausea. Genitourinary: Negative for dysuria.  Musculoskeletal: Negative for back pain. Skin: Negative for rash. Neurological: Negative for headache.   ____________________________________________   PHYSICAL EXAM:  VITAL SIGNS: ED Triage Vitals  Enc Vitals Group     BP 12/15/20 1007 127/79     Pulse Rate 12/15/20 1007 63     Resp 12/15/20 1007 20     Temp 12/15/20 1007 98.9 F (37.2 C)     Temp Source 12/15/20 1007 Oral     SpO2 12/15/20 1007 96 %     Weight 12/15/20 1007 183 lb (83 kg)     Height 12/15/20 1007 5\' 2"  (1.575 m)     Head Circumference --      Peak Flow --      Pain Score 12/15/20 1022 5     Pain Loc --      Pain Edu? --      Excl. in [place]? --     Constitutional: Alert and oriented. Well appearing and in no acute distress. Eyes: Conjunctivae are normal.  Head: Atraumatic. Nose: No congestion/rhinnorhea. Mouth/Throat: Mucous membranes are moist.   Neck: Normal range of motion.  Cardiovascular: Normal rate, regular rhythm. Good peripheral circulation. Respiratory: Normal respiratory effort.  No retractions.  Gastrointestinal: Soft with moderate left lower quadrant and mid abdominal tenderness.  No distention.  Genitourinary: No flank tenderness. Musculoskeletal: No lower extremity edema.  Extremities warm and well perfused.  Neurologic:  Normal speech and language. No gross focal neurologic deficits are appreciated.  Skin:  Skin is warm and dry. No rash noted. Psychiatric: Mood and affect are normal. Speech and behavior are normal.  ____________________________________________   LABS (all labs ordered are listed, but only abnormal results are displayed)  Labs Reviewed  COMPREHENSIVE METABOLIC PANEL - Abnormal; Notable for the following components:      Result Value   Glucose, Bld 104  (*)    Alkaline Phosphatase 202 (*)    All other components within normal limits  CBC - Abnormal; Notable for the following components:   RDW 15.9 (*)    All other components within normal limits  URINALYSIS, COMPLETE (UACMP) WITH MICROSCOPIC - Abnormal; Notable for the following components:  Color, Urine YELLOW (*)    APPearance CLEAR (*)    Hgb urine dipstick SMALL (*)    All other components within normal limits  LIPASE, BLOOD   ____________________________________________  EKG   ____________________________________________  RADIOLOGY  CT abdomen/pelvis:  IMPRESSION:  1. Focal inflammatory changes lateral to the proximal descending  colon likely represent epiploic appendagitis. Focal diverticulitis  is considered much less likely.  2. Descending and sigmoid diverticulosis without diverticulitis.  3. Aortic Atherosclerosis (ICD10-I70.0).    ____________________________________________   PROCEDURES  Procedure(s) performed: No  Procedures  Critical Care performed: No ____________________________________________   INITIAL IMPRESSION / ASSESSMENT AND PLAN / ED COURSE  Pertinent labs & imaging results that were available during my care of the patient were reviewed by me and considered in my medical decision making (see chart for details).   76 year old female with PMH as noted above presents with several days of left lower quadrant pain with associated nausea but no other significant symptoms.  On exam she is overall well-appearing.  Her vital signs are normal.  She does have some left lower and mid abdominal tenderness.  Differential includes diverticulitis, colitis, ureteral stone, gastritis, musculoskeletal pain.  Initial lab work-up including urinalysis is unremarkable.  We will obtain a CT for further evaluation.  ----------------------------------------- 3:15 PM on 12/15/2020 -----------------------------------------  CT shows likely epiploic appendagitis  although a small area of diverticulitis is not ruled out.  The patient continues to appear comfortable and has not required any pain medication here.  She is stable for discharge home.  I counseled her on the results of the work-up and the small possibility of diverticulitis.  There is no indication for antibiotics at this time.  I gave her very thorough return precautions and she expressed understanding.   ____________________________________________   FINAL CLINICAL IMPRESSION(S) / ED DIAGNOSES  Final diagnoses:  Epiploic appendagitis      NEW MEDICATIONS STARTED DURING THIS VISIT:  New Prescriptions   TRAMADOL (ULTRAM) 50 MG TABLET    Take 1 tablet (50 mg total) by mouth every 6 (six) hours as needed for up to 5 days for moderate pain.     Note:  This document was prepared using Dragon voice recognition software and may include unintentional dictation errors.    Arta Silence, MD 12/15/20 [phone]

## 2020-12-15 NOTE — ED Triage Notes (Signed)
Pt comes with c/o left sided belly pain for few days. Pt states some nausea.

## 2020-12-15 NOTE — Discharge Instructions (Addendum)
Your CT scan shows epiploic appendagitis which does not require any specific treatment.  It is also possible you have a very small area of diverticulitis which is an inflammation of the pouch in your large intestine.  Take Tylenol or ibuprofen for the pain, and you may take the prescribed tramadol if needed for slightly more severe pain.  Return to the ER immediately for new, worsening, or persistent severe pain, vomiting, fever, blood in the stool, diarrhea, or any other new or worsening symptoms that concern you.

## 2020-12-24 ENCOUNTER — Encounter (INDEPENDENT_AMBULATORY_CARE_PROVIDER_SITE_OTHER): Payer: BC Managed Care – PPO | Admitting: Ophthalmology

## 2020-12-24 ENCOUNTER — Other Ambulatory Visit: Payer: Self-pay

## 2020-12-24 DIAGNOSIS — H348312 Tributary (branch) retinal vein occlusion, right eye, stable: Secondary | ICD-10-CM | POA: Diagnosis not present

## 2020-12-24 DIAGNOSIS — H43813 Vitreous degeneration, bilateral: Secondary | ICD-10-CM

## 2020-12-24 DIAGNOSIS — H34812 Central retinal vein occlusion, left eye, with macular edema: Secondary | ICD-10-CM

## 2020-12-24 DIAGNOSIS — H35033 Hypertensive retinopathy, bilateral: Secondary | ICD-10-CM

## 2020-12-24 DIAGNOSIS — I10 Essential (primary) hypertension: Secondary | ICD-10-CM | POA: Diagnosis not present

## 2020-12-24 DIAGNOSIS — H338 Other retinal detachments: Secondary | ICD-10-CM

## 2020-12-25 ENCOUNTER — Encounter (INDEPENDENT_AMBULATORY_CARE_PROVIDER_SITE_OTHER): Payer: Medicare Other | Admitting: Ophthalmology

## 2020-12-29 NOTE — Progress Notes (Signed)
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 MammaPrint low risk.  INTERVAL HISTORY: Patient returns to clinic today for routine 87-monthevaluation.  She currently feels well and is asymptomatic.  She is tolerating letrozole without significant side effects.  She has no neurologic complaints. She has noted no new lymphadenopathy.  She denies any chest pain, shortness of breath, cough, or hemoptysis.  She denies any nausea, vomiting, constipation, or diarrhea. She has no urinary complaints.  Patient feels at her baseline offers no specific complaints today.  REVIEW OF SYSTEMS:   Review of Systems  Constitutional:  Negative for chills, diaphoresis, fever, malaise/fatigue and weight loss.  Respiratory: Negative.  Negative for cough and shortness of breath.   Cardiovascular: Negative.  Negative for chest pain and leg swelling.  Gastrointestinal: Negative.  Negative for abdominal pain.  Genitourinary: Negative.  Negative for dysuria.  Musculoskeletal: Negative.  Negative for back pain.  Skin: Negative.  Negative for rash.  Neurological: Negative.  Negative for sensory change, focal weakness, weakness and headaches.  Psychiatric/Behavioral: Negative.  The patient is not nervous/anxious.    As per HPI. Otherwise, a complete review of systems is negative.  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 MammaPrint was reported as low risk, therefore  she did not require adjuvant chemotherapy. She completed adjuvant XRT. Will complete 5 year of letrozole in July 2023.   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  Patient's most recent CT scan on April 28, 2018 revealed no evidence of progressive or recurrent disease.    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[place]  07/05/2014 Initial Diagnosis   MALT (mucosa associated lymphoid tissue)   Primary cancer of [place] quadrant of left female breast ([place]  05/09/2016 Initial Diagnosis   Primary cancer of [place] outer quadrant of left female breast ([place]     PAST MEDICAL HISTORY: Past Medical History:  Diagnosis Date   Anginal pain ([place]    Arthritis t   knee   Asthma    mild   Breast cancer ([place]    Coronary artery disease T   1 artery 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 [place]  Depression    Dyspnea    Dysrhythmia    GERD (gastroesophageal reflux disease)    Hypertension    Hypothyroidism    MALT (mucosa associated lymphoid tissue) ([place] 07/05/2014   Right neck mass resected 01/2014.   No pertinent past medical history    Personal history of radiation therapy    Sleep apnea t   O2- 2l at bedtime and BiPap @ bedtime    PAST SURGICAL HISTORY: Past Surgical History:  Procedure Laterality Date   BREAST BIOPSY Left 2/136/2018   INVASIVE MAMMARY CARCINOMA   BREAST BIOPSY Right 05/25/2016   FIBROCYSTIC CHANGE WITH CALCIFICATIONS    BREAST BIOPSY Right 05/29/2020   Affirm bx-"X" clip-path pending   BREAST EXCISIONAL BIOPSY Left 06/04/2016   INVASIVE MAMMARY  CARCINOMA.    BREAST LUMPECTOMY Left 06/04/2016   INVASIVE MAMMARY CARCINOMA.    CARDIAC CATHETERIZATION     CARDIAC CATHETERIZATION N/A 09/24/2015   Procedure: Left Heart Cath and Coronary Angiography;  Surgeon: Isaias Cowman, MD;  Location: [place] [facility];  Service: Cardiovascular;  Laterality: N/A;   CARDIAC CATHETERIZATION N/A 09/24/2015    Procedure: Coronary Stent Intervention;  Surgeon: Isaias Cowman, MD;  Location: [place] [facility];  Service: Cardiovascular;  Laterality: N/A;   CHOLECYSTECTOMY  11/26   08/1970   DILATATION & CURETTAGE/HYSTEROSCOPY WITH MYOSURE N/A 05/05/2015   Procedure: Hysteroscopy and endometrial curretage;  Surgeon: Boykin Nearing, MD;  Location: [facility];  Service: Gynecology;  Laterality: N/A;   DILATION AND CURETTAGE OF UTERUS     EXCISION MASS FROM NECK  Right    Dr. Tami Ribas   EYE SURGERY  t   bil cataract 9/08   HAMMER TOE SURGERY Right    JOINT REPLACEMENT Bilateral 2008, 11/26/ 2012   Partial Knee Replacements   LEFT HEART CATH AND CORONARY ANGIOGRAPHY N/A 10/25/2017   Procedure: LEFT HEART CATH AND CORONARY ANGIOGRAPHY;  Surgeon: Teodoro Spray, MD;  Location: [place] [facility];  Service: Cardiovascular;  Laterality: N/A;   LEFT HEART CATH AND CORONARY ANGIOGRAPHY N/A 10/26/2017   Procedure: LEFT HEART CATH AND CORONARY ANGIOGRAPHY;  Surgeon: Isaias Cowman, MD;  Location: [place] [facility];  Service: Cardiovascular;  Laterality: N/A;   PARTIAL MASTECTOMY WITH NEEDLE LOCALIZATION Left 06/04/2016   Procedure: PARTIAL MASTECTOMY WITH NEEDLE LOCALIZATION;  Surgeon: Leonie Green, MD;  Location: [facility];  Service: General;  Laterality: Left;   SCLERAL BUCKLE  02/16/2011   Procedure: SCLERAL BUCKLE;  Surgeon: Hayden Pedro, MD;  Location: [place];  Service: Ophthalmology;  Laterality: Left;  Scleral Buckle Left Eye with Headscope Laser   SENTINEL NODE BIOPSY Left 06/04/2016   Procedure: SENTINEL NODE BIOPSY;  Surgeon: Leonie Green, MD;  Location: [facility];  Service: General;  Laterality: Left;    FAMILY HISTORY Family History  Problem Relation Age of Onset   Breast cancer Mother 25   Heart disease Father    Breast cancer Paternal Aunt 14   Breast cancer Cousin    Anesthesia problems Neg Hx    Hypotension Neg Hx    Malignant hyperthermia Neg Hx     Pseudochol deficiency Neg Hx     GYNECOLOGIC HISTORY:  No LMP recorded. Patient is postmenopausal.     ADVANCED DIRECTIVES:    HEALTH MAINTENANCE: Social History   Tobacco Use   Smoking status: Never   Smokeless tobacco: Never  Vaping Use   Vaping Use: Never used  Substance Use Topics   Alcohol use: No   Drug use: No     Colonoscopy:  PAP:  Bone density:  Mammogram: November 2016  Allergies  Allergen Reactions   Codeine Other (See Comments)    Stroke like symptoms   Tramadol Itching    Current Outpatient Medications  Medication Sig Dispense Refill   amLODipine (NORVASC) 5 MG tablet Take 5 mg by mouth daily.     apixaban (ELIQUIS) 5 MG TABS tablet Take 5 mg by mouth 2 (two) times daily.     aspirin 81 MG EC tablet Take 81 mg by mouth daily.      atorvastatin (LIPITOR) 40 MG tablet Take 1 tablet (40 mg total) by mouth at bedtime.     diltiazem (CARDIZEM) 30 MG tablet Take 30 mg by mouth  2 (two) times daily as needed (for breakthrough afib).      Fluticasone-Salmeterol (ADVAIR) 100-50 MCG/DOSE AEPB Inhale 1 puff into the lungs 2 (two) times daily as needed (for asthma.).     isosorbide mononitrate (IMDUR) 30 MG 24 hr tablet Take 30 mg by mouth daily.     letrozole (FEMARA) 2.5 MG tablet TAKE 1 TABLET BY MOUTH ONCE DAILY 90 tablet 3   levothyroxine (SYNTHROID) 125 MCG tablet Take 125 mcg by mouth daily.     nitroGLYCERIN (NITROSTAT) 0.4 MG SL tablet Place 0.4 mg under the tongue every 5 (five) minutes as needed for chest pain. Maximum 3 doses, If no relief call md or 911.     oxybutynin (DITROPAN) 5 MG tablet Take 5 mg by mouth at bedtime.      potassium chloride (KLOR-CON) 10 MEQ tablet Take 1 tablet (10 mEq total) by mouth 2 (two) times daily.     RABEprazole (ACIPHEX) 20 MG tablet Take 20 mg by mouth daily.     sertraline (ZOLOFT) 50 MG tablet Take 50 mg by mouth at bedtime.     torsemide (DEMADEX) 20 MG tablet Take 10 mg by mouth as directed. Take on Tuesday and  Friday     acidophilus (RISAQUAD) CAPS capsule Take 1 capsule by mouth daily. (Patient not taking: No sig reported)     metoprolol (TOPROL-XL) 100 MG 24 hr tablet Take 50 mg by mouth 2 (two) times daily. (Patient not taking: No sig reported)     sotalol (BETAPACE) 80 MG tablet Take 80 mg by mouth 2 (two) times daily. (Patient not taking: No sig reported)     No current facility-administered medications for this visit.    OBJECTIVE: BP 111/74   Pulse 60   Temp (!) 97.5 F (36.4 C)   Resp 16   Wt 184 lb 12.8 oz (83.8 kg)   BMI 33.80 kg/m    Body mass index is 33.8 kg/m.    ECOG FS:0 - Asymptomatic  General: Well-developed, well-nourished, no acute distress. Eyes: Pink conjunctiva, anicteric sclera. HEENT: Normocephalic, moist mucous membranes. Breasts: Exam deferred today. Lungs: No audible wheezing or coughing. Heart: Regular rate and rhythm. Abdomen: Soft, nontender, no obvious distention. Musculoskeletal: No edema, cyanosis, or clubbing. Neuro: Alert, answering all questions appropriately. Cranial nerves grossly intact. Skin: No rashes or petechiae noted. Psych: Normal affect.   LAB RESULTS:   STUDIES: No results found.  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 MammaPrint low risk.  PLAN:    1. 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 MammaPrint was reported as low risk, therefore she did not require adjuvant chemotherapy. She completed adjuvant XRT.  Continue letrozole for a total of 5 years completing treatment in July 2023.  Patient underwent biopsy of calcifications in her right breast which were negative for malignancy.  Mammogram on May 19, 2020 on the left breast was reported as  stable.  Repeat mammogram in February 2023.  Return to clinic in 6 months for routine evaluation.   2. 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  Patient's most recent CT scan on April 28, 2018 reviewed independently revealed no evidence of progressive or recurrent disease.  No further imaging is necessary unless there is suspicion of progression of disease. 3.  Left renal lesion: CT scan results from December 15, 2020 reviewed independently and report as above with only mild increase of benign renal lesion to 13 mm.  No intervention is needed at this time. 4.  Bone mineral density: Patient's most recent bone mineral density on November 13, 2020 revealed a T score of -2.0 which is slightly worse than 1 year prior where her T score was reported -1.7.  Continue calcium and vitamin D supplementation.  Repeat in August 2023.    Patient expressed understanding and was in agreement with this plan. She also understands that She can call clinic at any time with any questions, concerns, or complaints.     Lloyd Huger, MD   12/30/2020 11:24 AM

## 2020-12-30 ENCOUNTER — Other Ambulatory Visit: Payer: Self-pay

## 2020-12-30 ENCOUNTER — Encounter: Payer: Self-pay | Admitting: Oncology

## 2020-12-30 ENCOUNTER — Inpatient Hospital Stay: Payer: BC Managed Care – PPO | Attending: Oncology | Admitting: Oncology

## 2020-12-30 VITALS — BP 111/74 | HR 60 | Temp 97.5°F | Resp 16 | Wt 184.8 lb

## 2020-12-30 DIAGNOSIS — C50412 Malignant neoplasm of upper-outer quadrant of left female breast: Secondary | ICD-10-CM | POA: Diagnosis not present

## 2020-12-30 DIAGNOSIS — Z170 Estrogen receptor positive status [ER+]: Secondary | ICD-10-CM | POA: Insufficient documentation

## 2020-12-30 DIAGNOSIS — N289 Disorder of kidney and ureter, unspecified: Secondary | ICD-10-CM | POA: Diagnosis not present

## 2020-12-30 DIAGNOSIS — Z803 Family history of malignant neoplasm of breast: Secondary | ICD-10-CM | POA: Insufficient documentation

## 2020-12-30 DIAGNOSIS — Z79811 Long term (current) use of aromatase inhibitors: Secondary | ICD-10-CM | POA: Insufficient documentation

## 2020-12-30 DIAGNOSIS — C884 Extranodal marginal zone B-cell lymphoma of mucosa-associated lymphoid tissue [MALT-lymphoma]: Secondary | ICD-10-CM | POA: Insufficient documentation

## 2020-12-30 NOTE — Progress Notes (Signed)
Patient denies new problems/concerns today.   °

## 2021-01-06 ENCOUNTER — Other Ambulatory Visit: Payer: Self-pay

## 2021-01-06 ENCOUNTER — Other Ambulatory Visit: Payer: Self-pay | Admitting: Internal Medicine

## 2021-01-06 ENCOUNTER — Ambulatory Visit
Admission: RE | Admit: 2021-01-06 | Discharge: 2021-01-06 | Disposition: A | Discharge status: Home / Self Care | Payer: BC Managed Care – PPO | Source: Ambulatory Visit | Attending: Internal Medicine | Admitting: Internal Medicine

## 2021-01-06 DIAGNOSIS — R1031 Right lower quadrant pain: Secondary | ICD-10-CM | POA: Diagnosis not present

## 2021-01-06 MED ORDER — IOHEXOL 300 MG/ML  SOLN
100.0000 mL | Freq: Once | INTRAMUSCULAR | Status: AC | PRN
  Administered 2021-01-06: 100 mL via INTRAVENOUS

## 2021-02-04 ENCOUNTER — Encounter (INDEPENDENT_AMBULATORY_CARE_PROVIDER_SITE_OTHER): Payer: BC Managed Care – PPO | Admitting: Ophthalmology

## 2021-02-04 ENCOUNTER — Other Ambulatory Visit: Payer: Self-pay

## 2021-02-04 DIAGNOSIS — H34812 Central retinal vein occlusion, left eye, with macular edema: Secondary | ICD-10-CM | POA: Diagnosis not present

## 2021-02-04 DIAGNOSIS — H35033 Hypertensive retinopathy, bilateral: Secondary | ICD-10-CM | POA: Diagnosis not present

## 2021-02-04 DIAGNOSIS — H338 Other retinal detachments: Secondary | ICD-10-CM | POA: Diagnosis not present

## 2021-02-04 DIAGNOSIS — H43813 Vitreous degeneration, bilateral: Secondary | ICD-10-CM

## 2021-02-04 DIAGNOSIS — I10 Essential (primary) hypertension: Secondary | ICD-10-CM

## 2021-03-13 ENCOUNTER — Other Ambulatory Visit: Payer: Self-pay

## 2021-03-13 ENCOUNTER — Encounter (INDEPENDENT_AMBULATORY_CARE_PROVIDER_SITE_OTHER): Payer: BC Managed Care – PPO | Admitting: Ophthalmology

## 2021-03-13 DIAGNOSIS — H43813 Vitreous degeneration, bilateral: Secondary | ICD-10-CM

## 2021-03-13 DIAGNOSIS — H338 Other retinal detachments: Secondary | ICD-10-CM

## 2021-03-13 DIAGNOSIS — H348312 Tributary (branch) retinal vein occlusion, right eye, stable: Secondary | ICD-10-CM

## 2021-03-13 DIAGNOSIS — I10 Essential (primary) hypertension: Secondary | ICD-10-CM

## 2021-03-13 DIAGNOSIS — H34812 Central retinal vein occlusion, left eye, with macular edema: Secondary | ICD-10-CM | POA: Diagnosis not present

## 2021-03-13 DIAGNOSIS — H35033 Hypertensive retinopathy, bilateral: Secondary | ICD-10-CM | POA: Diagnosis not present

## 2021-04-23 ENCOUNTER — Other Ambulatory Visit: Payer: Self-pay

## 2021-04-23 ENCOUNTER — Encounter (INDEPENDENT_AMBULATORY_CARE_PROVIDER_SITE_OTHER): Payer: BC Managed Care – PPO | Admitting: Ophthalmology

## 2021-04-23 DIAGNOSIS — H34812 Central retinal vein occlusion, left eye, with macular edema: Secondary | ICD-10-CM | POA: Diagnosis not present

## 2021-05-20 ENCOUNTER — Ambulatory Visit
Admission: RE | Admit: 2021-05-20 | Discharge: 2021-05-20 | Disposition: A | Discharge status: Home / Self Care | Payer: BC Managed Care – PPO | Source: Ambulatory Visit | Attending: Oncology | Admitting: Oncology

## 2021-05-20 ENCOUNTER — Other Ambulatory Visit: Payer: Self-pay

## 2021-05-20 DIAGNOSIS — Z1231 Encounter for screening mammogram for malignant neoplasm of breast: Secondary | ICD-10-CM | POA: Diagnosis not present

## 2021-05-20 DIAGNOSIS — C50412 Malignant neoplasm of upper-outer quadrant of left female breast: Secondary | ICD-10-CM | POA: Diagnosis present

## 2021-05-29 ENCOUNTER — Encounter (INDEPENDENT_AMBULATORY_CARE_PROVIDER_SITE_OTHER): Payer: BC Managed Care – PPO | Admitting: Ophthalmology

## 2021-05-29 ENCOUNTER — Other Ambulatory Visit: Payer: Self-pay

## 2021-05-29 DIAGNOSIS — H348312 Tributary (branch) retinal vein occlusion, right eye, stable: Secondary | ICD-10-CM | POA: Diagnosis not present

## 2021-05-29 DIAGNOSIS — H338 Other retinal detachments: Secondary | ICD-10-CM | POA: Diagnosis not present

## 2021-05-29 DIAGNOSIS — H34812 Central retinal vein occlusion, left eye, with macular edema: Secondary | ICD-10-CM | POA: Diagnosis not present

## 2021-05-29 DIAGNOSIS — I10 Essential (primary) hypertension: Secondary | ICD-10-CM

## 2021-05-29 DIAGNOSIS — H35033 Hypertensive retinopathy, bilateral: Secondary | ICD-10-CM | POA: Diagnosis not present

## 2021-05-29 DIAGNOSIS — H43813 Vitreous degeneration, bilateral: Secondary | ICD-10-CM | POA: Diagnosis not present

## 2021-06-30 ENCOUNTER — Inpatient Hospital Stay: Payer: BC Managed Care – PPO | Attending: Nurse Practitioner | Admitting: Nurse Practitioner

## 2021-06-30 ENCOUNTER — Encounter: Payer: Self-pay | Admitting: Nurse Practitioner

## 2021-06-30 VITALS — BP 147/76 | HR 55 | Temp 98.7°F | Resp 20 | Wt 185.6 lb

## 2021-06-30 DIAGNOSIS — C50412 Malignant neoplasm of upper-outer quadrant of left female breast: Secondary | ICD-10-CM | POA: Diagnosis present

## 2021-06-30 DIAGNOSIS — N289 Disorder of kidney and ureter, unspecified: Secondary | ICD-10-CM | POA: Insufficient documentation

## 2021-06-30 DIAGNOSIS — Z923 Personal history of irradiation: Secondary | ICD-10-CM | POA: Insufficient documentation

## 2021-06-30 DIAGNOSIS — M8580 Other specified disorders of bone density and structure, unspecified site: Secondary | ICD-10-CM | POA: Insufficient documentation

## 2021-06-30 DIAGNOSIS — Z79811 Long term (current) use of aromatase inhibitors: Secondary | ICD-10-CM | POA: Insufficient documentation

## 2021-06-30 DIAGNOSIS — Z853 Personal history of malignant neoplasm of breast: Secondary | ICD-10-CM

## 2021-06-30 DIAGNOSIS — R194 Change in bowel habit: Secondary | ICD-10-CM | POA: Insufficient documentation

## 2021-06-30 DIAGNOSIS — Z79899 Other long term (current) drug therapy: Secondary | ICD-10-CM | POA: Insufficient documentation

## 2021-06-30 DIAGNOSIS — Z8579 Personal history of other malignant neoplasms of lymphoid, hematopoietic and related tissues: Secondary | ICD-10-CM | POA: Diagnosis not present

## 2021-06-30 NOTE — Progress Notes (Signed)
?[place]  ?[phone]  [phone]  ? ? ?Stacey Stone DOB: 10-Feb-1946  MR#: [id number]  ZHY#:[id number] ? ?Patient Care Team: ?Rusty Aus, MD as PCP - General (Internal Medicine) ? 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 MammaPrint low risk. ? ?INTERVAL HISTORY: Patient returns to clinic today for routine 41-monthevaluation.  She continues to feel well and remains asymptomatic.  She continues to tolerate letrozole well without significant side effects.  She has no neurologic complaints.  No new lumps or bumps.  Denies any chest pain, shortness of breath, cough, hemoptysis.  No breast pain, skin changes, or discharge.  She denies nausea, vomiting, constipation, diarrhea.  No new urinary complaints.  She feels at baseline and denies other specific complaints today. ? ?REVIEW OF SYSTEMS:   ?Review of Systems  ?Constitutional:  Negative for chills, diaphoresis, fever, malaise/fatigue and weight loss.  ?Respiratory: Negative.  Negative for cough and shortness of breath.   ?Cardiovascular: Negative.  Negative for chest pain and leg swelling.  ?Gastrointestinal: Negative.  Negative for abdominal pain.  ?Genitourinary: Negative.  Negative for dysuria.  ?Musculoskeletal: Negative.  Negative for back pain.  ?Skin: Negative.  Negative for rash.  ?Neurological: Negative.  Negative for sensory change, focal weakness, weakness and headaches.  ?Psychiatric/Behavioral: Negative.  The patient is not nervous/anxious.   ?As per HPI. Otherwise, a complete review of systems is negative. ? 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 MammaPrint was  reported as low risk, therefore she did not require adjuvant chemotherapy. She completed adjuvant XRT. Will complete 5 year of letrozole in July 2023.  ? 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  Patient's most recent CT scan on April 28, 2018 revealed no evidence of progressive or recurrent disease.  ?  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 MALT (mucosa associated lymphoid tissue) ?  ?Primary cancer of upper outer quadrant of left female breast ([place]  ?05/09/2016 Initial Diagnosis  ? Primary cancer of upper outer quadrant of left female breast ([place] ?  ? ? ?PAST MEDICAL HISTORY: ?Past Medical History:  ?Diagnosis Date  ? Anginal pain ([facility]   ? Arthritis t  ? knee  ? Asthma   ? mild  ? Breast cancer ([facility]   ? Coronary artery disease T  ? 1 artery 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 [place] ? Depression   ? Dyspnea   ? Dysrhythmia   ? GERD (gastroesophageal reflux disease)   ? Hypertension   ? Hypothyroidism   ? MALT (mucosa associated lymphoid tissue) ([facility] 07/05/2014  ? Right neck mass resected 01/2014.  ? No pertinent past medical history   ? Personal history of radiation therapy   ? Sleep apnea t  ? O2- 2l at bedtime and BiPap @ bedtime  ? ? ?PAST SURGICAL HISTORY: ?Past Surgical History:  ?Procedure Laterality Date  ? BREAST BIOPSY Left 2/136/2018  ? INVASIVE MAMMARY CARCINOMA  ? BREAST BIOPSY Right 05/25/2016  ? FIBROCYSTIC CHANGE WITH CALCIFICATIONS   ? BREAST BIOPSY Right 05/29/2020  ? Affirm bx-"X" clip-FIBROADENOMA WITH ASSOCIATED CALCIFICATIONS. - NEGATIVE  ?  BREAST EXCISIONAL BIOPSY Left 06/04/2016  ? INVASIVE MAMMARY CARCINOMA.   ? BREAST LUMPECTOMY Left 06/04/2016  ? INVASIVE MAMMARY CARCINOMA.   ? CARDIAC CATHETERIZATION    ? CARDIAC CATHETERIZATION N/A 09/24/2015  ? Procedure: Left Heart Cath and Coronary Angiography;  Surgeon: Isaias Cowman, MD;  Location: [place] [facility];  Service:  Cardiovascular;  Laterality: N/A;  ? CARDIAC CATHETERIZATION N/A 09/24/2015  ? Procedure: Coronary Stent Intervention;  Surgeon: Isaias Cowman, MD;  Location: [place] [facility];  Service: Cardiovascular;  Laterality: N/A;  ? CHOLECYSTECTOMY  11/26  ? 08/1970  ? DILATATION & CURETTAGE/HYSTEROSCOPY WITH MYOSURE N/A 05/05/2015  ? Procedure: Hysteroscopy and endometrial curretage;  Surgeon: Boykin Nearing, MD;  Location: [facility];  Service: Gynecology;  Laterality: N/A;  ? DILATION AND CURETTAGE OF UTERUS    ? EXCISION MASS FROM NECK  Right   ? Dr. Tami Ribas  ? EYE SURGERY  05/29/2020  ? bil cataract 9/08  ? HAMMER TOE SURGERY Right   ? JOINT REPLACEMENT Bilateral 2008, 11/26/ 2012  ? Partial Knee Replacements  ? LEFT HEART CATH AND CORONARY ANGIOGRAPHY N/A 10/25/2017  ? Procedure: LEFT HEART CATH AND CORONARY ANGIOGRAPHY;  Surgeon: Teodoro Spray, MD;  Location: [place] [facility];  Service: Cardiovascular;  Laterality: N/A;  ? LEFT HEART CATH AND CORONARY ANGIOGRAPHY N/A 10/26/2017  ? Procedure: LEFT HEART CATH AND CORONARY ANGIOGRAPHY;  Surgeon: Isaias Cowman, MD;  Location: [place] [facility];  Service: Cardiovascular;  Laterality: N/A;  ? PARTIAL MASTECTOMY WITH NEEDLE LOCALIZATION Left 06/04/2016  ? Procedure: PARTIAL MASTECTOMY WITH NEEDLE LOCALIZATION;  Surgeon: Leonie Green, MD;  Location: [facility];  Service: General;  Laterality: Left;  ? SCLERAL BUCKLE  02/16/2011  ? Procedure: SCLERAL BUCKLE;  Surgeon: Hayden Pedro, MD;  Location: [facility];  Service: Ophthalmology;  Laterality: Left;  Scleral Buckle Left Eye with Headscope Laser  ? SENTINEL NODE BIOPSY Left 06/04/2016  ? Procedure: SENTINEL NODE BIOPSY;  Surgeon: Leonie Green, MD;  Location: [facility];  Service: General;  Laterality: Left;  ? ? ?FAMILY HISTORY ?Family History  ?Problem Relation Age of Onset  ? Breast cancer Mother 37  ? Heart disease Father   ? Breast cancer Paternal Aunt 29  ? Breast cancer Cousin   ?  Anesthesia problems Neg Hx   ? Hypotension Neg Hx   ? Malignant hyperthermia Neg Hx   ? Pseudochol deficiency Neg Hx   ? ? ?GYNECOLOGIC HISTORY:  No LMP recorded. Patient is postmenopausal.  ? ?  ?ADVANCED DIRECTIVES:  ? ? ?HEALTH MAINTENANCE: ?Social History  ? ?Tobacco Use  ? Smoking status: Never  ? Smokeless tobacco: Never  ?Vaping Use  ? Vaping Use: Never used  ?Substance Use Topics  ? Alcohol use: No  ? Drug use: No  ? ? ? Colonoscopy: ? PAP: ? Bone density: ? Mammogram: November 2016 ? ?Allergies  ?Allergen Reactions  ? Codeine Other (See Comments)  ?  Stroke like symptoms  ? Tramadol Itching  ? ? ?Current Outpatient Medications  ?Medication Sig Dispense Refill  ? acidophilus (RISAQUAD) CAPS capsule Take 1 capsule by mouth daily. (Patient not taking: No sig reported)    ? amLODipine (NORVASC) 5 MG tablet Take 5 mg by mouth daily.    ? apixaban (ELIQUIS) 5 MG TABS tablet Take 5 mg by mouth 2 (two) times daily.    ? aspirin 81 MG EC tablet Take 81 mg by mouth daily.     ?  atorvastatin (LIPITOR) 40 MG tablet Take 1 tablet (40 mg total) by mouth at bedtime.    ? diltiazem (CARDIZEM) 30 MG tablet Take 30 mg by mouth 2 (two) times daily as needed (for breakthrough afib).     ? Fluticasone-Salmeterol (ADVAIR) 100-50 MCG/DOSE AEPB Inhale 1 puff into the lungs 2 (two) times daily as needed (for asthma.).    ? isosorbide mononitrate (IMDUR) 30 MG 24 hr tablet Take 30 mg by mouth daily.    ? letrozole (FEMARA) 2.5 MG tablet TAKE 1 TABLET BY MOUTH ONCE DAILY 90 tablet 3  ? levothyroxine (SYNTHROID) 125 MCG tablet Take 125 mcg by mouth daily.    ? metoprolol (TOPROL-XL) 100 MG 24 hr tablet Take 50 mg by mouth 2 (two) times daily. (Patient not taking: No sig reported)    ? nitroGLYCERIN (NITROSTAT) 0.4 MG SL tablet Place 0.4 mg under the tongue every 5 (five) minutes as needed for chest pain. Maximum 3 doses, If no relief call md or 911.    ? oxybutynin (DITROPAN) 5 MG tablet Take 5 mg by mouth at bedtime.     ? potassium  chloride (KLOR-CON) 10 MEQ tablet Take 1 tablet (10 mEq total) by mouth 2 (two) times daily.    ? RABEprazole (ACIPHEX) 20 MG tablet Take 20 mg by mouth daily.    ? sertraline (ZOLOFT) 50 MG tablet Take 50 mg by

## 2021-07-07 ENCOUNTER — Encounter: Payer: Self-pay | Admitting: Internal Medicine

## 2021-07-08 ENCOUNTER — Ambulatory Visit: Payer: BC Managed Care – PPO | Admitting: Anesthesiology

## 2021-07-08 ENCOUNTER — Encounter
Admission: RE | Disposition: A | Discharge status: Home / Self Care | Payer: Self-pay | Source: Home / Self Care | Attending: Internal Medicine

## 2021-07-08 ENCOUNTER — Ambulatory Visit
Admission: RE | Admit: 2021-07-08 | Discharge: 2021-07-08 | Disposition: A | Discharge status: Home / Self Care | Payer: BC Managed Care – PPO | Attending: Internal Medicine | Admitting: Internal Medicine

## 2021-07-08 ENCOUNTER — Encounter: Payer: Self-pay | Admitting: Internal Medicine

## 2021-07-08 DIAGNOSIS — I2510 Atherosclerotic heart disease of native coronary artery without angina pectoris: Secondary | ICD-10-CM | POA: Insufficient documentation

## 2021-07-08 DIAGNOSIS — J45909 Unspecified asthma, uncomplicated: Secondary | ICD-10-CM | POA: Diagnosis not present

## 2021-07-08 DIAGNOSIS — G4730 Sleep apnea, unspecified: Secondary | ICD-10-CM | POA: Insufficient documentation

## 2021-07-08 DIAGNOSIS — I4891 Unspecified atrial fibrillation: Secondary | ICD-10-CM | POA: Insufficient documentation

## 2021-07-08 DIAGNOSIS — K5730 Diverticulosis of large intestine without perforation or abscess without bleeding: Secondary | ICD-10-CM | POA: Insufficient documentation

## 2021-07-08 DIAGNOSIS — D125 Benign neoplasm of sigmoid colon: Secondary | ICD-10-CM | POA: Insufficient documentation

## 2021-07-08 DIAGNOSIS — E039 Hypothyroidism, unspecified: Secondary | ICD-10-CM | POA: Insufficient documentation

## 2021-07-08 DIAGNOSIS — Z955 Presence of coronary angioplasty implant and graft: Secondary | ICD-10-CM | POA: Diagnosis not present

## 2021-07-08 DIAGNOSIS — Z1211 Encounter for screening for malignant neoplasm of colon: Secondary | ICD-10-CM | POA: Insufficient documentation

## 2021-07-08 DIAGNOSIS — I10 Essential (primary) hypertension: Secondary | ICD-10-CM | POA: Insufficient documentation

## 2021-07-08 DIAGNOSIS — K219 Gastro-esophageal reflux disease without esophagitis: Secondary | ICD-10-CM | POA: Insufficient documentation

## 2021-07-08 DIAGNOSIS — K640 First degree hemorrhoids: Secondary | ICD-10-CM | POA: Insufficient documentation

## 2021-07-08 HISTORY — DX: Left lower quadrant pain: R10.32

## 2021-07-08 HISTORY — PX: COLONOSCOPY WITH PROPOFOL: SHX5780

## 2021-07-08 HISTORY — DX: Type 2 diabetes mellitus without complications: E11.9

## 2021-07-08 HISTORY — DX: Unspecified atrial fibrillation: I48.91

## 2021-07-08 SURGERY — COLONOSCOPY WITH PROPOFOL
Anesthesia: General

## 2021-07-08 MED ORDER — PROPOFOL 500 MG/50ML IV EMUL
INTRAVENOUS | Status: DC | PRN
  Administered 2021-07-08: 100 ug/kg/min via INTRAVENOUS

## 2021-07-08 MED ORDER — SODIUM CHLORIDE 0.9 % IV SOLN
INTRAVENOUS | Status: DC
Start: 2021-07-08 — End: 2021-07-08
  Administered 2021-07-08: 1000 mL via INTRAVENOUS

## 2021-07-08 MED ORDER — GLYCOPYRROLATE 0.2 MG/ML IJ SOLN
INTRAMUSCULAR | Status: DC | PRN
  Administered 2021-07-08: .4 mg via INTRAVENOUS

## 2021-07-08 MED ORDER — LIDOCAINE HCL (CARDIAC) PF 100 MG/5ML IV SOSY
PREFILLED_SYRINGE | INTRAVENOUS | Status: DC | PRN
  Administered 2021-07-08: 100 mg via INTRAVENOUS

## 2021-07-08 MED ORDER — PROPOFOL 10 MG/ML IV BOLUS
INTRAVENOUS | Status: DC | PRN
  Administered 2021-07-08: 50 mg via INTRAVENOUS

## 2021-07-08 MED ORDER — ATROPINE SULFATE 0.4 MG/ML IV SOLN
INTRAVENOUS | Status: DC | PRN
  Administered 2021-07-08: .4 mg via INTRAVENOUS

## 2021-07-08 NOTE — Interval H&P Note (Signed)
History and Physical Interval Note: ? ?07/08/2021 ?1:04 PM ? ?Stacey Stone  has presented today for surgery, with the diagnosis of Colon Cancer screening Z12.11.  The various methods of treatment have been discussed with the patient and family. After consideration of risks, benefits and other options for treatment, the patient has consented to  Procedure(s): ?COLONOSCOPY WITH PROPOFOL (N/A) as a surgical intervention.  The patient's history has been reviewed, patient examined, no change in status, stable for surgery.  I have reviewed the patient's chart and labs.  Questions were answered to the patient's satisfaction.   ? ? ?[place], [place] ? ? ?

## 2021-07-08 NOTE — Op Note (Signed)
[facility] ?Gastroenterology ?Patient Name: Stacey Stone ?Procedure Date: 07/08/2021 1:54 PM ?MRN: [id number] ?Account #: [id number] ?Date of Birth: 04/06/1945 ?Admit Type: Outpatient ?Age: 76 ?Room: [facility] ENDO ROOM 2 ?Gender: Female ?Note Status: Finalized ?Instrument Name: Colonoscope [id number] ?Procedure:             Colonoscopy ?Indications:           Screening for colorectal malignant neoplasm ?Providers:             Benay Pike. Alice Reichert MD, MD ?Referring MD:          Rusty Aus, MD (Referring MD) ?Medicines:             Propofol per Anesthesia ?Complications:         No immediate complications. ?Procedure:             Pre-Anesthesia Assessment: ?                       - The risks and benefits of the procedure and the  ?                       sedation options and risks were discussed with the  ?                       patient. All questions were answered and informed  ?                       consent was obtained. ?                       - Patient identification and proposed procedure were  ?                       verified prior to the procedure by the nurse. The  ?                       procedure was verified in the procedure room. ?                       - ASA Grade Assessment: III - A patient with severe  ?                       systemic disease. ?                       - After reviewing the risks and benefits, the patient  ?                       was deemed in satisfactory condition to undergo the  ?                       procedure. ?                       After obtaining informed consent, the colonoscope was  ?                       passed under direct vision. Throughout the procedure,  ?  the patient's blood pressure, pulse, and oxygen  ?                       saturations were monitored continuously. The  ?                       Colonoscope was introduced through the anus and  ?                       advanced to the the cecum, identified by appendiceal  ?                        orifice and ileocecal valve. The colonoscopy was  ?                       technically difficult and complex due to significant  ?                       looping. Successful completion of the procedure was  ?                       aided by withdrawing and reinserting the scope and  ?                       straightening and shortening the scope to obtain bowel  ?                       loop reduction. The patient tolerated the procedure  ?                       well. The quality of the bowel preparation was  ?                       adequate. The ileocecal valve, appendiceal orifice,  ?                       and rectum were photographed. ?Findings: ?     The perianal and digital rectal examinations were normal. Pertinent  ?     negatives include normal sphincter tone and no palpable rectal lesions. ?     Non-bleeding internal hemorrhoids were found during retroflexion. The  ?     hemorrhoids were Grade I (internal hemorrhoids that do not prolapse). ?     Many small-mouthed diverticula were found in the sigmoid colon. ?     Three sessile polyps were found in the sigmoid colon and ascending  ?     colon. The polyps were 4 to 5 mm in size. These polyps were removed with  ?     a jumbo cold forceps. Resection and retrieval were complete. ?     The exam was otherwise without abnormality. ?Impression:            - Non-bleeding internal hemorrhoids. ?                       - Diverticulosis in the sigmoid colon. ?                       - Three 4 to 5 mm polyps in the sigmoid colon and in  ?  the ascending colon, removed with a jumbo cold  ?                       forceps. Resected and retrieved. ?                       - The examination was otherwise normal. ?Recommendation:        - Patient has a contact number available for  ?                       emergencies. The signs and symptoms of potential  ?                       delayed complications were discussed with the patient.  ?                        Return to normal activities tomorrow. Written  ?                       discharge instructions were provided to the patient. ?                       - Resume previous diet. ?                       - Continue present medications. ?                       - If polyps are benign or adenomatous without  ?                       dysplasia, I will advise NO further colonoscopy due to  ?                       advanced age and/or severe comorbidity. ?                       - Return to GI office PRN. ?                       - The findings and recommendations were discussed with  ?                       the patient. ?Procedure Code(s):     --- Professional --- ?                       [phone], Colonoscopy, flexible; with biopsy, single or  ?                       multiple ?Diagnosis Code(s):     --- Professional --- ?                       K57.30, Diverticulosis of large intestine without  ?                       perforation or abscess without bleeding ?                       K63.5, Polyp of colon ?  K64.0, First degree hemorrhoids ?                       Z12.11, Encounter for screening for malignant neoplasm  ?                       of colon ?CPT copyright 2019 American Medical Association. All rights reserved. ?The codes documented in this report are preliminary and upon coder review may  ?be revised to meet current compliance requirements. ?Efrain Sella MD, MD ?07/08/2021 2:35:40 PM ?This report has been signed electronically. ?Number of Addenda: 0 ?Note Initiated On: 07/08/2021 1:54 PM ?Scope Withdrawal Time: 0 hours 4 minutes 20 seconds  ?Total Procedure Duration: 0 hours 22 minutes 36 seconds  ?Estimated Blood Loss:  Estimated blood loss: none. ?     [facility] ?

## 2021-07-08 NOTE — Anesthesia Preprocedure Evaluation (Signed)
Anesthesia Evaluation  ?Patient identified by MRN, date of birth, ID band ?Patient awake ? ? ? ?Reviewed: ?Allergy & Precautions, H&P , NPO status , Patient's Chart, lab work & pertinent test results, reviewed documented beta blocker date and time  ? ?History of Anesthesia Complications ?(+) PROLONGED EMERGENCE and history of anesthetic complications ? ?Airway ?Mallampati: I ? ?TM Distance: >3 FB ?Neck ROM: full ? ? ? Dental ? ?(+) Partial Upper, Partial Lower, Missing, Dental Advidsory Given ?  ?Pulmonary ?shortness of breath and with exertion, asthma , sleep apnea , neg COPD, neg recent URI,  ?  ? ? ? ? ? ? ? Cardiovascular ?Exercise Tolerance: Good ?hypertension, (-) angina+ CAD and + Cardiac Stents  ?(-) Past MI and (-) CABG + dysrhythmias Atrial Fibrillation (-) Valvular Problems/Murmurs ? ? ?  ?Neuro/Psych ?PSYCHIATRIC DISORDERS Depression negative neurological ROS ?   ? GI/Hepatic ?Neg liver ROS, GERD  Medicated,  ?Endo/Other  ?diabetesHypothyroidism  ? Renal/GU ?negative Renal ROS  ?negative genitourinary ?  ?Musculoskeletal ? ? Abdominal ?  ?Peds ? Hematology ?negative hematology ROS ?(+)   ?Anesthesia Other Findings ?Past Medical History: ?No date: Anginal pain ([place]) ?t: Arthritis ?    Comment: knee ?No date: Asthma ?    Comment: mild ?T: Coronary artery disease ?    Comment: 1 artery 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 Mamie Nick. at  ?             [facility] in [place] ?No date: Depression ?No date: Dyspnea ?No date: Dysrhythmia ?No date: GERD (gastroesophageal reflux disease) ?No date: Hypertension ?No date: Hypothyroidism ?07/05/2014: MALT (mucosa associated lymphoid tissue) ([place]) ?    Comment: Right neck mass resected 01/2014. ?No date: No pertinent past medical history ?t: Sleep apnea ?    Comment: O2- 2l at bedtime and BiPap @ bedtime ? ? Reproductive/Obstetrics ?negative OB ROS ? ?  ? ? ? ? ? ? ? ? ? ? ? ? ? ?  ?  ? ? ? ? ? ? ? ? ?Anesthesia Physical ? ?Anesthesia  Plan ? ?ASA: 3 ? ?Anesthesia Plan: General  ? ?Post-op Pain Management:   ? ?Induction: Intravenous ? ?PONV Risk Score and Plan: 3 and Propofol infusion and TIVA ? ?Airway Management Planned: Natural Airway ? ?Additional Equipment:  ? ?Intra-op Plan:  ? ?Post-operative Plan:  ? ?Informed Consent: I have reviewed the patients History and Physical, chart, labs and discussed the procedure including the risks, benefits and alternatives for the proposed anesthesia with the patient or authorized representative who has indicated his/her understanding and acceptance.  ? ? ? ?Dental Advisory Given ? ?Plan Discussed with: Anesthesiologist, CRNA and Surgeon ? ?Anesthesia Plan Comments:   ? ? ? ? ? ? ?Anesthesia Quick Evaluation ? ?

## 2021-07-08 NOTE — Transfer of Care (Signed)
Immediate Anesthesia Transfer of Care Note ? ?Patient: Stacey Stone ? ?Procedure(s) Performed: COLONOSCOPY WITH PROPOFOL ? ?Patient Location: PACU and Endoscopy Unit ? ?Anesthesia Type:MAC ? ?Level of Consciousness: drowsy ? ?Airway & Oxygen Therapy: Patient Spontanous Breathing and Patient connected to nasal cannula oxygen ? ?Post-op Assessment: Report given to RN and Post -op Vital signs reviewed and stable ? ?Post vital signs: Reviewed and stable ? ?Last Vitals:  ?Vitals Value Taken Time  ?BP 93/54 07/08/21 1436  ?Temp 36 ?C 07/08/21 1436  ?Pulse 51 07/08/21 1438  ?Resp 19 07/08/21 1438  ?SpO2 96 % 07/08/21 1438  ?Vitals shown include unvalidated device data. ? ?Last Pain:  ?Vitals:  ? 07/08/21 1436  ?TempSrc: Temporal  ?PainSc: Asleep  ?   ? ?Patients Stated Pain Goal: 0 (07/08/21 1302) ? ?Complications: No notable events documented. ?

## 2021-07-08 NOTE — H&P (Signed)
Outpatient short stay form Pre-procedure ?07/08/2021 12:13 PM ?Lorie Apley K. Alice Reichert, M.D. ? ?Primary Physician: Emily Filbert, M.D. ? ?Reason for visit:  Colon cancer screening ? ?History of present illness:  Patient presents for colonoscopy for colon cancer screening. The patient denies complaints of abdominal pain, significant change in bowel habits, or rectal bleeding.  ?Has hx of epiploic appendagitis, recurrent abdominal pain.  ? ? ?No current facility-administered medications for this encounter. ? ?Current Outpatient Medications:  ?  acetaminophen (TYLENOL) 500 MG tablet, Take 500 mg by mouth every 4 (four) hours as needed., Disp: , Rfl:  ?  famotidine (PEPCID) 40 MG tablet, Take 40 mg by mouth at bedtime., Disp: , Rfl:  ?  ferrous sulfate 325 (65 FE) MG tablet, Take 325 mg by mouth once a week., Disp: , Rfl:  ?  acidophilus (RISAQUAD) CAPS capsule, Take 1 capsule by mouth daily. (Patient not taking: Reported on 12/15/2020), Disp: , Rfl:  ?  amLODipine (NORVASC) 5 MG tablet, Take 5 mg by mouth daily., Disp: , Rfl:  ?  apixaban (ELIQUIS) 5 MG TABS tablet, Take 5 mg by mouth 2 (two) times daily., Disp: , Rfl:  ?  aspirin 81 MG EC tablet, Take 81 mg by mouth daily. , Disp: , Rfl:  ?  atorvastatin (LIPITOR) 40 MG tablet, Take 1 tablet (40 mg total) by mouth at bedtime., Disp: , Rfl:  ?  diltiazem (CARDIZEM) 30 MG tablet, Take 30 mg by mouth 2 (two) times daily as needed (for breakthrough afib). , Disp: , Rfl:  ?  Fluticasone-Salmeterol (ADVAIR) 100-50 MCG/DOSE AEPB, Inhale 1 puff into the lungs 2 (two) times daily as needed (for asthma.)., Disp: , Rfl:  ?  isosorbide mononitrate (IMDUR) 30 MG 24 hr tablet, Take 30 mg by mouth daily., Disp: , Rfl:  ?  letrozole (FEMARA) 2.5 MG tablet, TAKE 1 TABLET BY MOUTH ONCE DAILY, Disp: 90 tablet, Rfl: 3 ?  levothyroxine (SYNTHROID) 125 MCG tablet, Take 125 mcg by mouth daily., Disp: , Rfl:  ?  metoprolol (TOPROL-XL) 100 MG 24 hr tablet, Take 50 mg by mouth 2 (two) times daily.  (Patient not taking: Reported on 12/15/2020), Disp: , Rfl:  ?  nitroGLYCERIN (NITROSTAT) 0.4 MG SL tablet, Place 0.4 mg under the tongue every 5 (five) minutes as needed for chest pain. Maximum 3 doses, If no relief call md or 911., Disp: , Rfl:  ?  oxybutynin (DITROPAN) 5 MG tablet, Take 5 mg by mouth at bedtime. , Disp: , Rfl:  ?  potassium chloride (KLOR-CON) 10 MEQ tablet, Take 1 tablet (10 mEq total) by mouth 2 (two) times daily., Disp: , Rfl:  ?  RABEprazole (ACIPHEX) 20 MG tablet, Take 20 mg by mouth daily., Disp: , Rfl:  ?  sertraline (ZOLOFT) 50 MG tablet, Take 50 mg by mouth at bedtime., Disp: , Rfl:  ?  sotalol (BETAPACE) 80 MG tablet, Take 80 mg by mouth 2 (two) times daily. (Patient not taking: Reported on 12/15/2020), Disp: , Rfl:  ?  torsemide (DEMADEX) 20 MG tablet, Take 10 mg by mouth as directed. Take on Tuesday and Friday, Disp: , Rfl:  ? ?No medications prior to admission.  ? ? ? ?Allergies  ?Allergen Reactions  ? Codeine Other (See Comments)  ?  Stroke like symptoms  ? Tramadol Itching  ? ? ? ?Past Medical History:  ?Diagnosis Date  ? Abdominal pain, left lower quadrant   ? epiploic appendagitis  ? Anginal pain ([place])   ? Arthritis  06/06/2018  ? knee  ? Asthma   ? mild  ? Atrial fibrillation ([place])   ? Breast cancer ([place])   ? Coronary artery disease 06/06/2018  ? 1 artery 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 Mamie Nick. at [facility] in [place]  ? Depression   ? Diabetes mellitus without complication ([place])   ? Dyspnea   ? Dysrhythmia   ? GERD (gastroesophageal reflux disease)   ? Hypertension   ? Hypothyroidism   ? MALT (mucosa associated lymphoid tissue) ([place]) 07/05/2014  ? Right neck mass resected 01/2014.  ? No pertinent past medical history   ? Personal history of radiation therapy   ? Sleep apnea 06/06/2018  ? O2- 2l at bedtime and BiPap @ bedtime  ? ? ?Review of systems:  Otherwise negative.  ? ? ?Physical Exam ? ?Gen: Alert, oriented. Appears stated age.  ?HEENT: [place]/AT. PERRLA. ?Lungs: CTA, no  wheezes. ?CV: RR nl S1, S2. ?Abd: soft, benign, no masses. BS+ ?Ext: No edema. Pulses 2+ ? ? ? ?Planned procedures: Proceed with colonoscopy. The patient understands the nature of the planned procedure, indications, risks, alternatives and potential complications including but not limited to bleeding, infection, perforation, damage to internal organs and possible oversedation/side effects from anesthesia. The patient agrees and gives consent to proceed.  ?Please refer to procedure notes for findings, recommendations and patient disposition/instructions.  ? ? ? ?Jakera Beaupre K. Alice Reichert, M.D. ?Gastroenterology ?07/08/2021  12:13 PM ? ? ? ? ? ? ?

## 2021-07-09 NOTE — Anesthesia Postprocedure Evaluation (Signed)
Anesthesia Post Note ? ?Patient: [name] ? ?Procedure(s) Performed: COLONOSCOPY WITH PROPOFOL ? ?Patient location during evaluation: Endoscopy ?Anesthesia Type: General ?Level of consciousness: awake and alert ?Pain management: pain level controlled ?Vital Signs Assessment: post-procedure vital signs reviewed and stable ?Respiratory status: spontaneous breathing, nonlabored ventilation, respiratory function stable and patient connected to nasal cannula oxygen ?Cardiovascular status: blood pressure returned to baseline and stable ?Postop Assessment: no apparent nausea or vomiting ?Anesthetic complications: no ? ? ?No notable events documented. ? ? ?Last Vitals:  ?Vitals:  ? 07/08/21 1446 07/08/21 1456  ?BP: 110/60 (!) 102/53  ?Pulse: (!) 57 (!) 54  ?Resp: (!) 21 20  ?Temp:    ?SpO2: 95% 95%  ?  ?Last Pain:  ?Vitals:  ? 07/08/21 1456  ?TempSrc:   ?PainSc: 0-No pain  ? ? ?  ?  ?  ?  ?  ?  ? ?Martha Clan ? ? ? ? ?

## 2021-07-10 ENCOUNTER — Encounter: Payer: Self-pay | Admitting: Internal Medicine

## 2021-07-10 ENCOUNTER — Encounter (INDEPENDENT_AMBULATORY_CARE_PROVIDER_SITE_OTHER): Payer: BC Managed Care – PPO | Admitting: Ophthalmology

## 2021-07-10 DIAGNOSIS — H35033 Hypertensive retinopathy, bilateral: Secondary | ICD-10-CM

## 2021-07-10 DIAGNOSIS — H348312 Tributary (branch) retinal vein occlusion, right eye, stable: Secondary | ICD-10-CM

## 2021-07-10 DIAGNOSIS — H43813 Vitreous degeneration, bilateral: Secondary | ICD-10-CM | POA: Diagnosis not present

## 2021-07-10 DIAGNOSIS — I10 Essential (primary) hypertension: Secondary | ICD-10-CM | POA: Diagnosis not present

## 2021-07-10 DIAGNOSIS — H34812 Central retinal vein occlusion, left eye, with macular edema: Secondary | ICD-10-CM

## 2021-07-13 LAB — SURGICAL PATHOLOGY

## 2021-07-22 ENCOUNTER — Encounter: Admission: RE | Payer: Self-pay | Source: Home / Self Care

## 2021-07-22 ENCOUNTER — Ambulatory Visit
Admission: RE | Admit: 2021-07-22 | Payer: BC Managed Care – PPO | Source: Home / Self Care | Admitting: Internal Medicine

## 2021-07-22 SURGERY — COLONOSCOPY WITH PROPOFOL
Anesthesia: General

## 2021-07-31 ENCOUNTER — Other Ambulatory Visit: Payer: Self-pay | Admitting: Internal Medicine

## 2021-07-31 DIAGNOSIS — R590 Localized enlarged lymph nodes: Secondary | ICD-10-CM

## 2021-08-07 ENCOUNTER — Other Ambulatory Visit: Payer: BC Managed Care – PPO

## 2021-08-11 ENCOUNTER — Ambulatory Visit
Admission: RE | Admit: 2021-08-11 | Discharge: 2021-08-11 | Disposition: A | Discharge status: Home / Self Care | Payer: BC Managed Care – PPO | Source: Ambulatory Visit | Attending: Internal Medicine | Admitting: Internal Medicine

## 2021-08-11 DIAGNOSIS — R590 Localized enlarged lymph nodes: Secondary | ICD-10-CM

## 2021-08-11 MED ORDER — IOPAMIDOL (ISOVUE-300) INJECTION 61%
75.0000 mL | Freq: Once | INTRAVENOUS | Status: AC | PRN
  Administered 2021-08-11: 75 mL via INTRAVENOUS

## 2021-08-12 ENCOUNTER — Telehealth: Payer: Self-pay | Admitting: *Deleted

## 2021-08-12 NOTE — Telephone Encounter (Signed)
Call from Dr Sanjuan Dame office regarding results of Neck CT done today, Dr Caryl Comes (in Dr Sanjuan Dame absence) asks that Dr Grayland Ormond see patient ASAP regarding this, her next appointment with Dr Grayland Ormond is not until October. She does also have an upcoming appointment with ENT  IMPRESSION: Widespread lymphadenopathy involving the right more than left neck, mediastinum, and right axilla. Recurrent lymphoma or metastatic disease are both possible, favor lymphoma.   Sinus opacification asymmetric to the right.     Electronically Signed   By: Jorje Guild M.D.   On: 08/12/2021 11:14

## 2021-08-13 ENCOUNTER — Other Ambulatory Visit: Payer: Self-pay

## 2021-08-13 DIAGNOSIS — R590 Localized enlarged lymph nodes: Secondary | ICD-10-CM

## 2021-08-13 NOTE — Telephone Encounter (Signed)
Order for STAT PET scan ordered.

## 2021-08-14 NOTE — Telephone Encounter (Signed)
PET scheduled for 08/19/2021

## 2021-08-14 NOTE — Progress Notes (Unsigned)
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  Now with widespread lymphadenopathy.  INTERVAL HISTORY: Patient returns to clinic today as an add-on after noting painless neck lymphadenopathy.  Patient states she was given antibiotics for 1 week without resolution of her symptoms.  Subsequent imaging including PET scan revealed new widespread lymphadenopathy.  She otherwise feels well.  She does not complain of any fevers, night sweats, or weight loss.  She continues to tolerate letrozole well without significant side effects.  She has no neurologic complaints.  She denies any chest pain, shortness of breath, cough, or hemoptysis.  She denies any nausea, vomiting, constipation, or diarrhea. She has no urinary complaints.  Patient offers no further specific complaints today.  REVIEW OF SYSTEMS:   Review of Systems  Constitutional:  Negative for chills, diaphoresis, fever, malaise/fatigue and weight loss.  Respiratory: Negative.  Negative for cough and shortness of breath.   Cardiovascular: Negative.  Negative for chest pain and leg swelling.  Gastrointestinal: Negative.  Negative for abdominal pain.  Genitourinary: Negative.  Negative for dysuria.  Musculoskeletal: Negative.  Negative for back pain.  Skin: Negative.  Negative for rash.  Neurological: Negative.  Negative for sensory change, focal weakness, weakness and headaches.  Psychiatric/Behavioral: Negative.  The patient is not nervous/anxious.    As per HPI. Otherwise, a complete review of systems is  negative.  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 MammaPrint was reported as low risk, therefore she did not require adjuvant chemotherapy. She completed adjuvant XRT. Will complete 5 year of letrozole in July 2023.   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  Patient's most recent CT scan on April 28, 2018 revealed no evidence of progressive or recurrent disease.    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/05/2014 Initial Diagnosis   MALT (mucosa associated lymphoid tissue)    Primary cancer of upper outer quadrant of left female breast ([place])  05/09/2016 Initial Diagnosis   Primary cancer of upper outer quadrant of left female breast [facility])     PAST MEDICAL HISTORY: Past Medical History:  Diagnosis Date   Abdominal pain, left lower quadrant    epiploic appendagitis   Anginal pain ([place])    Arthritis 06/06/2018   knee   Asthma    mild   Atrial fibrillation ([place])    Breast cancer ([place])    Coronary artery disease 06/06/2018   1 artery 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 Mamie Nick. at [address] [facility] in [place]   Depression    Diabetes mellitus without complication ([place])    Dyspnea    Dysrhythmia    GERD (gastroesophageal reflux disease)    Hypertension    Hypothyroidism    MALT (mucosa associated lymphoid tissue) ([place]) 07/05/2014   Right neck mass resected 01/2014.   No pertinent past medical history    Personal history of radiation therapy    Sleep  apnea 06/06/2018   O2- 2l at bedtime and BiPap @ bedtime    PAST SURGICAL HISTORY: Past Surgical History:  Procedure Laterality Date   BREAST BIOPSY Left 2/136/2018   INVASIVE MAMMARY CARCINOMA   BREAST BIOPSY Right 05/25/2016   FIBROCYSTIC CHANGE WITH CALCIFICATIONS     BREAST BIOPSY Right 05/29/2020   Affirm bx-"X" clip-FIBROADENOMA WITH ASSOCIATED CALCIFICATIONS. - NEGATIVE   BREAST EXCISIONAL BIOPSY Left 06/04/2016   INVASIVE MAMMARY CARCINOMA.    BREAST LUMPECTOMY Left 06/04/2016   INVASIVE MAMMARY CARCINOMA.    CARDIAC CATHETERIZATION     CARDIAC CATHETERIZATION N/A 09/24/2015   Procedure: Left Heart Cath and Coronary Angiography;  Surgeon: Isaias Cowman, MD;  Location: [place] [facility];  Service: Cardiovascular;  Laterality: N/A;   CARDIAC CATHETERIZATION N/A 09/24/2015   Procedure: Coronary Stent Intervention;  Surgeon: Isaias Cowman, MD;  Location: [place] [facility];  Service: Cardiovascular;  Laterality: N/A;   CHOLECYSTECTOMY  11/26   08/1970   COLONOSCOPY WITH PROPOFOL N/A 07/08/2021   Procedure: COLONOSCOPY WITH PROPOFOL;  Surgeon: Toledo, Benay Pike, MD;  Location: [facility];  Service: Gastroenterology;  Laterality: N/A;   DILATATION & CURETTAGE/HYSTEROSCOPY WITH MYOSURE N/A 05/05/2015   Procedure: Hysteroscopy and endometrial curretage;  Surgeon: Boykin Nearing, MD;  Location: [facility];  Service: Gynecology;  Laterality: N/A;   DILATION AND CURETTAGE OF UTERUS     EXCISION MASS FROM NECK  Right    Dr. Tami Ribas   EYE SURGERY  05/29/2020   bil cataract 9/08   HAMMER TOE SURGERY Right    JOINT REPLACEMENT Bilateral 2008, 11/26/ 2012   Partial Knee Replacements   LEFT HEART CATH AND CORONARY ANGIOGRAPHY N/A 10/25/2017   Procedure: LEFT HEART CATH AND CORONARY ANGIOGRAPHY;  Surgeon: Teodoro Spray, MD;  Location: [place] [facility];  Service: Cardiovascular;  Laterality: N/A;   LEFT HEART CATH AND CORONARY ANGIOGRAPHY N/A 10/26/2017   Procedure: LEFT HEART CATH AND CORONARY ANGIOGRAPHY;  Surgeon: Isaias Cowman, MD;  Location: [place] [facility];  Service: Cardiovascular;  Laterality: N/A;   PARTIAL MASTECTOMY WITH NEEDLE LOCALIZATION Left 06/04/2016   Procedure: PARTIAL MASTECTOMY WITH NEEDLE  LOCALIZATION;  Surgeon: Leonie Green, MD;  Location: [facility];  Service: General;  Laterality: Left;   SCLERAL BUCKLE  02/16/2011   Procedure: SCLERAL BUCKLE;  Surgeon: Hayden Pedro, MD;  Location: [place];  Service: Ophthalmology;  Laterality: Left;  Scleral Buckle Left Eye with Headscope Laser   SENTINEL NODE BIOPSY Left 06/04/2016   Procedure: SENTINEL NODE BIOPSY;  Surgeon: Leonie Green, MD;  Location: [facility];  Service: General;  Laterality: Left;    FAMILY HISTORY Family History  Problem Relation Age of Onset   Breast cancer Mother 58   Heart disease Father    Breast cancer Paternal Aunt 52   Breast cancer Cousin    Anesthesia problems Neg Hx    Hypotension Neg Hx    Malignant hyperthermia Neg Hx    Pseudochol deficiency Neg Hx     GYNECOLOGIC HISTORY:  No LMP recorded. Patient is postmenopausal.     ADVANCED DIRECTIVES:    HEALTH MAINTENANCE: Social History   Tobacco Use   Smoking status: Never   Smokeless tobacco: Never  Vaping Use   Vaping Use: Never used  Substance Use Topics   Alcohol use: No   Drug use: No     Colonoscopy:  PAP:  Bone density:  Mammogram: November 2016  Allergies  Allergen Reactions   Codeine  Other (See Comments)    Stroke like symptoms   Tramadol Itching    Current Outpatient Medications  Medication Sig Dispense Refill   acetaminophen (TYLENOL) 500 MG tablet Take 500 mg by mouth every 4 (four) hours as needed.     amLODipine (NORVASC) 5 MG tablet Take 5 mg by mouth daily.     apixaban (ELIQUIS) 5 MG TABS tablet Take 5 mg by mouth 2 (two) times daily.     aspirin 81 MG EC tablet Take 81 mg by mouth daily.      atorvastatin (LIPITOR) 40 MG tablet Take 1 tablet (40 mg total) by mouth at bedtime.     famotidine (PEPCID) 40 MG tablet Take 40 mg by mouth at bedtime.     ferrous sulfate 325 (65 FE) MG tablet Take 325 mg by mouth once a week.     Fluticasone-Salmeterol (ADVAIR) 100-50 MCG/DOSE AEPB Inhale 1 puff into  the lungs 2 (two) times daily as needed (for asthma.).     isosorbide mononitrate (IMDUR) 30 MG 24 hr tablet Take 30 mg by mouth daily.     letrozole (FEMARA) 2.5 MG tablet TAKE 1 TABLET BY MOUTH ONCE DAILY 90 tablet 3   levothyroxine (SYNTHROID) 125 MCG tablet Take 125 mcg by mouth daily.     nitroGLYCERIN (NITROSTAT) 0.4 MG SL tablet Place 0.4 mg under the tongue every 5 (five) minutes as needed for chest pain. Maximum 3 doses, If no relief call md or 911.     oxybutynin (DITROPAN) 5 MG tablet Take 5 mg by mouth at bedtime.      potassium chloride (KLOR-CON) 10 MEQ tablet Take 1 tablet (10 mEq total) by mouth 2 (two) times daily.     RABEprazole (ACIPHEX) 20 MG tablet Take 20 mg by mouth daily.     sertraline (ZOLOFT) 50 MG tablet Take 50 mg by mouth at bedtime.     sotalol (BETAPACE) 80 MG tablet Take 80 mg by mouth 2 (two) times daily.     torsemide (DEMADEX) 20 MG tablet Take 10 mg by mouth as directed. Take on Tuesday and Friday     acidophilus (RISAQUAD) CAPS capsule Take 1 capsule by mouth daily. (Patient not taking: Reported on 12/15/2020)     diltiazem (CARDIZEM) 30 MG tablet Take 30 mg by mouth 2 (two) times daily as needed (for breakthrough afib).  (Patient not taking: Reported on 07/08/2021)     metoprolol (TOPROL-XL) 100 MG 24 hr tablet Take 50 mg by mouth 2 (two) times daily. (Patient not taking: Reported on 12/15/2020)     No current facility-administered medications for this visit.    OBJECTIVE: BP 119/74 (BP Location: Right Arm, Patient Position: Sitting, Cuff Size: Normal)   Pulse 65   Temp 98.9 F (37.2 C) (Tympanic)   Resp 16   Ht 5' 2"  (1.575 m)   Wt 181 lb (82.1 kg)   SpO2 97%   BMI 33.11 kg/m    Body mass index is 33.11 kg/m.    ECOG FS:0 - Asymptomatic  General: Well-developed, well-nourished, no acute distress. Eyes: Pink conjunctiva, anicteric sclera. HEENT: Normocephalic, moist mucous membranes.  Easily palpable cervical lymphadenopathy. Lungs: No audible  wheezing or coughing. Heart: Regular rate and rhythm. Abdomen: Soft, nontender, no obvious distention. Musculoskeletal: No edema, cyanosis, or clubbing. Neuro: Alert, answering all questions appropriately. Cranial nerves grossly intact. Skin: No rashes or petechiae noted. Psych: Normal affect.  LAB RESULTS:   STUDIES: NM PET Image Initial (PI) Skull Base  To Thigh  Result Date: 08/19/2021 CLINICAL DATA:  Subsequent treatment strategy for lymphoma in a 76 year old female also with history of breast cancer in 2018. EXAM: NUCLEAR MEDICINE PET SKULL BASE TO THIGH TECHNIQUE: 9.55 mCi F-18 FDG was injected intravenously. Full-ring PET imaging was performed from the skull base to thigh after the radiotracer. CT data was obtained and used for attenuation correction and anatomic localization. Fasting blood glucose: 86 mg/dl COMPARISON:  CT of the neck 08/11/2021 and multiple prior CT and PET evaluations. Exams. FINDINGS: Mediastinal blood pool activity: SUV max 2.44 Liver activity: SUV max 4.0 NECK: Nodular area in the RIGHT maxillary sinus (image 28/2) is associated with anterior wall of the RIGHT maxillary sinus measuring 17 x 9 mm with a maximum SUV of 23.4. Adenopathy throughout the RIGHT neck with bulky appearance not substantially changed compared to recent CT imaging and greatest on the RIGHT at level III (image 52/2) 3.9 cm greatest axial dimension with a maximum SUV of 22.3 Lymph nodes track into the RIGHT thoracic inlet from the RIGHT neck all with marked metabolic activity. (Image 41/2) level II/lymph node 17 mm with an SUV of 29.8. Contralateral lymph nodes with very mild increased metabolic activity (image 76/1 LEFT level II lymph node is with a maximum SUV of 5.1. Bilateral supraclavicular lymph nodes (image 64/2 measuring 17 and 18 mm on the RIGHT and LEFT respectively, maximum SUV 12.75 on the RIGHT and 13.65 on the LEFT. Incidental CT findings: none CHEST: RIGHT paratracheal, at LEFT  paratracheal, AP window and RIGHT hilar adenopathy with increased metabolic activity showing marked FDG uptake. Subcarinal adenopathy (image 89/2) maximum SUV of 13.6 measuring 23 mm. RIGHT paratracheal lymph nodes tracking from the thoracic inlet along the RIGHT paratracheal chain largest approximately 15 mm with similar marked increased metabolic activity. RIGHT hilar activity with a maximum SUV of 15.8 (image 95/2. Discrete lymph node difficult to measure in this area. RIGHT axillary lymph nodes with less pronounced metabolic activity tracking from RIGHT thoracic inlet beneath subpectoral lymph nodes on the RIGHT into the RIGHT axilla, dominant lymph node is a RIGHT subpectoral node with a maximum SUV of 4.7 (image 74/2 measuring approximately 10 mm. Retrocrural lymph nodes also with mild enlargement and moderate to marked increased metabolic activity contiguous with celiac and retroperitoneal nodal disease. No suspicious pulmonary nodules. Incidental CT findings: Aortic atherosclerosis. Moderate cardiomegaly. No pericardial effusion or nodularity. Central pulmonary vasculature unremarkable on noncontrast imaging. Basilar atelectasis. Airways are patent. ABDOMEN/PELVIS: Adenopathy tracks throughout the abdomen in the retroperitoneum and also involving celiac and periportal lymph nodes (image 134/2) diffuse increased metabolic activity associated with nodal disease tracking into the porta hepatis and along celiac branches open parent image 136/2) dominant lymph node 19 mm short axis with a maximum SUV of 16.9, similar increased metabolic activity associated with attic gastric and other celiac nodes seen on the current exam. Bulky LEFT periaortic adenopathy (image 159/2) 19 mm short axis with a maximum SUV of 14.81. Intra-aortocaval adenopathy with with a maximum SUV of 30.3 (image 149/2) this measures 15 mm. No signs of adenopathy in the pelvis. Focal area of uptake in the cephalad spleen (image 121/2) maximum SUV  of 5.3 without corresponding visible CT lesion though with mild lobulation of splenic contour in this location measuring approximately 13 mm. Heterogeneity in general of the spleen without signs of splenomegaly. Incidental CT findings: No acute findings relative to liver, pancreas, spleen, adrenal glands, kidneys, stomach, small or large bowel. There is colonic diverticulosis and there  is mild aortic atherosclerosis. SKELETON: No focal hypermetabolic activity to suggest skeletal metastasis. Incidental CT findings: none IMPRESSION: Extensive adenopathy involving the neck, chest and abdomen with SUV values as high as 30.3, suspicious for lymphoma recurrence or metastatic process. Most accessible lymph nodes are present in the neck. Splenic uptake with focal characteristics is more suggestive of lymphoma. RIGHT maxillary sinus nodularity with marked increased metabolic activity along the anterior wall of the RIGHT maxillary sinus, while atypical is suspicious for additional site of involvement. Aortic Atherosclerosis (ICD10-I70.0). Electronically Signed   By: Zetta Bills M.D.   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  Now with widespread lymphadenopathy.  PLAN:    1.  Lymphadenopathy: PET scan results from Aug 19, 2021 reviewed independently and report as above with widespread FDG avid lymphadenopathy concerning for either lymphoma recurrence or a new primary.  Patient will require biopsy to confirm.  Return to clinic 1 week after biopsy to discuss the results and treatment planning. 2. 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 MammaPrint was reported as low risk, therefore she did not  require adjuvant chemotherapy. She completed adjuvant XRT.  Continue letrozole for a total of 5 years completing treatment in July 2023.  Her most recent mammogram on May 20, 2021 was reported as BI-RADS 1.  Repeat in March 2024.   3.  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  PET scan results as above.  Patient will require repeat biopsy.   3.  Left renal lesion: CT scan results from December 15, 2020 reviewed independently and report as above with only mild increase of benign renal lesion to 13 mm.  No intervention is needed at this time. 4.  Bone mineral density: Patient's most recent bone mineral density on November 13, 2020 revealed a T score of -2.0 which is slightly worse than 1 year prior where her T score was reported -1.7.  Continue calcium and vitamin D supplementation.  Repeat in August 2023.    Patient expressed understanding and was in agreement with this plan. She also understands that She can call clinic at any time with any questions, concerns, or complaints.     Lloyd Huger, MD   08/20/2021 1:44 PM

## 2021-08-19 ENCOUNTER — Encounter
Admission: RE | Admit: 2021-08-19 | Discharge: 2021-08-19 | Disposition: A | Discharge status: Home / Self Care | Payer: BC Managed Care – PPO | Source: Ambulatory Visit | Attending: Oncology | Admitting: Oncology

## 2021-08-19 DIAGNOSIS — R590 Localized enlarged lymph nodes: Secondary | ICD-10-CM | POA: Insufficient documentation

## 2021-08-19 MED ORDER — FLUDEOXYGLUCOSE F - 18 (FDG) INJECTION
9.1000 | Freq: Once | INTRAVENOUS | Status: AC | PRN
Start: 2021-08-19 — End: 2021-08-19
  Administered 2021-08-19: 9.55 via INTRAVENOUS

## 2021-08-20 ENCOUNTER — Encounter: Payer: Self-pay | Admitting: *Deleted

## 2021-08-20 ENCOUNTER — Inpatient Hospital Stay: Payer: BC Managed Care – PPO | Attending: Nurse Practitioner | Admitting: Oncology

## 2021-08-20 ENCOUNTER — Encounter: Payer: Self-pay | Admitting: Oncology

## 2021-08-20 VITALS — BP 119/74 | HR 65 | Temp 98.9°F | Resp 16 | Ht 62.0 in | Wt 181.0 lb

## 2021-08-20 DIAGNOSIS — R634 Abnormal weight loss: Secondary | ICD-10-CM | POA: Insufficient documentation

## 2021-08-20 DIAGNOSIS — C8338 Diffuse large B-cell lymphoma, lymph nodes of multiple sites: Secondary | ICD-10-CM | POA: Insufficient documentation

## 2021-08-20 DIAGNOSIS — Z5189 Encounter for other specified aftercare: Secondary | ICD-10-CM | POA: Insufficient documentation

## 2021-08-20 DIAGNOSIS — C884 Extranodal marginal zone B-cell lymphoma of mucosa-associated lymphoid tissue [MALT-lymphoma]: Secondary | ICD-10-CM | POA: Diagnosis not present

## 2021-08-20 DIAGNOSIS — C50412 Malignant neoplasm of upper-outer quadrant of left female breast: Secondary | ICD-10-CM | POA: Insufficient documentation

## 2021-08-20 DIAGNOSIS — Z803 Family history of malignant neoplasm of breast: Secondary | ICD-10-CM | POA: Insufficient documentation

## 2021-08-20 DIAGNOSIS — R059 Cough, unspecified: Secondary | ICD-10-CM | POA: Insufficient documentation

## 2021-08-20 DIAGNOSIS — Z5111 Encounter for antineoplastic chemotherapy: Secondary | ICD-10-CM | POA: Insufficient documentation

## 2021-08-20 DIAGNOSIS — N289 Disorder of kidney and ureter, unspecified: Secondary | ICD-10-CM | POA: Diagnosis not present

## 2021-08-20 DIAGNOSIS — R531 Weakness: Secondary | ICD-10-CM | POA: Diagnosis not present

## 2021-08-20 DIAGNOSIS — Z170 Estrogen receptor positive status [ER+]: Secondary | ICD-10-CM | POA: Insufficient documentation

## 2021-08-20 DIAGNOSIS — Z79811 Long term (current) use of aromatase inhibitors: Secondary | ICD-10-CM | POA: Insufficient documentation

## 2021-08-20 DIAGNOSIS — R591 Generalized enlarged lymph nodes: Secondary | ICD-10-CM | POA: Diagnosis not present

## 2021-08-20 DIAGNOSIS — Z79899 Other long term (current) drug therapy: Secondary | ICD-10-CM | POA: Diagnosis not present

## 2021-08-20 LAB — GLUCOSE, CAPILLARY: Glucose-Capillary: 86 mg/dL (ref 70–99)

## 2021-08-20 NOTE — Patient Instructions (Signed)
Orders for core biopsy of lymph nodes in neck faxed to specialty scheduling, order entered. Patient will require clearance to stop Eliquis from Dr. Saralyn Pilar prior to scheduling. Anticoagulation clearance faxed to Dr. Saralyn Pilar.

## 2021-08-26 ENCOUNTER — Encounter (INDEPENDENT_AMBULATORY_CARE_PROVIDER_SITE_OTHER): Payer: BC Managed Care – PPO | Admitting: Ophthalmology

## 2021-08-26 ENCOUNTER — Telehealth: Payer: Self-pay

## 2021-08-26 DIAGNOSIS — H34812 Central retinal vein occlusion, left eye, with macular edema: Secondary | ICD-10-CM | POA: Diagnosis not present

## 2021-08-26 DIAGNOSIS — H348312 Tributary (branch) retinal vein occlusion, right eye, stable: Secondary | ICD-10-CM | POA: Diagnosis not present

## 2021-08-26 DIAGNOSIS — I10 Essential (primary) hypertension: Secondary | ICD-10-CM

## 2021-08-26 DIAGNOSIS — H338 Other retinal detachments: Secondary | ICD-10-CM

## 2021-08-26 DIAGNOSIS — H43813 Vitreous degeneration, bilateral: Secondary | ICD-10-CM | POA: Diagnosis not present

## 2021-08-26 DIAGNOSIS — H35033 Hypertensive retinopathy, bilateral: Secondary | ICD-10-CM | POA: Diagnosis not present

## 2021-08-26 NOTE — Telephone Encounter (Signed)
[place] LN biopsy scheduled for 6/12 @ 1 pm arrive at 12:30. Patient is aware to arrive at 12:30 and nurse will call prior to appointment to go over instructions regarding eliquis. Patient is also aware to take last dose of eliquis on 6/9.

## 2021-08-31 ENCOUNTER — Ambulatory Visit
Admission: RE | Admit: 2021-08-31 | Discharge: 2021-08-31 | Disposition: A | Discharge status: Home / Self Care | Payer: BC Managed Care – PPO | Source: Ambulatory Visit | Attending: Oncology | Admitting: Oncology

## 2021-08-31 DIAGNOSIS — R599 Enlarged lymph nodes, unspecified: Secondary | ICD-10-CM | POA: Insufficient documentation

## 2021-08-31 DIAGNOSIS — C884 Extranodal marginal zone B-cell lymphoma of mucosa-associated lymphoid tissue [MALT-lymphoma]: Secondary | ICD-10-CM | POA: Diagnosis present

## 2021-08-31 NOTE — Procedures (Signed)
Vascular and Interventional Radiology Procedure Note  Patient: Stacey Stone DOB: Mar 26, 1945 Medical Record Number: [id number] Note Date/Time: 08/31/21 2:06 PM   Performing Physician: Michaelle Birks, MD Assistant(s): None  Diagnosis: Hx lymphoma. Hypermet R cervical LNs  Procedure: RIGHT CERVICAL NODAL MASS BIOPSY  Anesthesia: Local Anesthetic Complications: None Estimated Blood Loss: Minimal Specimens: Sent for Pathology  Findings:  Successful Ultrasound-guided biopsy of R cervical nodal mass. A total of 3 samples were obtained. Hemostasis of the tract was achieved using Manual Pressure.  Plan: Bed rest for 1 hours.  See detailed procedure note with images in PACS. The patient tolerated the procedure well without incident or complication and was returned to Recovery in stable condition.    Michaelle Birks, MD [facility] Rad[facility]   Pager. [place]

## 2021-09-03 ENCOUNTER — Encounter: Payer: Self-pay | Admitting: Oncology

## 2021-09-04 NOTE — Progress Notes (Deleted)
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  Now with widespread lymphadenopathy.  INTERVAL HISTORY: Patient returns to clinic today as an add-on after noting painless neck lymphadenopathy.  Patient states she was given antibiotics for 1 week without resolution of her symptoms.  Subsequent imaging including PET scan revealed new widespread lymphadenopathy.  She otherwise feels well.  She does not complain of any fevers, night sweats, or weight loss.  She continues to tolerate letrozole well without significant side effects.  She has no neurologic complaints.  She denies any chest pain, shortness of breath, cough, or hemoptysis.  She denies any nausea, vomiting, constipation, or diarrhea. She has no urinary complaints.  Patient offers no further specific complaints today.  REVIEW OF SYSTEMS:   Review of Systems  Constitutional:  Negative for chills, diaphoresis, fever, malaise/fatigue and weight loss.  Respiratory: Negative.  Negative for cough and shortness of breath.   Cardiovascular: Negative.  Negative for chest pain and leg swelling.  Gastrointestinal: Negative.  Negative for abdominal pain.  Genitourinary: Negative.  Negative for dysuria.  Musculoskeletal: Negative.  Negative for back pain.  Skin: Negative.  Negative for rash.  Neurological: Negative.  Negative for sensory change, focal weakness, weakness and headaches.  Psychiatric/Behavioral: Negative.  The patient is not nervous/anxious.     As per HPI. Otherwise, a complete review of systems is  negative.  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 MammaPrint was reported as low risk, therefore she did not require adjuvant chemotherapy. She completed adjuvant XRT. Will complete 5 year of letrozole in July 2023.   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  Patient's most recent CT scan on April 28, 2018 revealed no evidence of progressive or recurrent disease.    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[place])  07/05/2014 Initial Diagnosis   MALT (mucosa associated lymphoid tissue)   Primary cancer of upper outer quadrant of left female breast ([place])  05/09/2016 Initial Diagnosis   Primary cancer of upper outer quadrant of left female breast [facility])     PAST MEDICAL HISTORY: Past Medical History:  Diagnosis Date   Abdominal pain, left lower quadrant    epiploic appendagitis   Anginal pain ([place])    Arthritis 06/06/2018   knee   Asthma    mild   Atrial fibrillation ([place])    Breast cancer ([place])    Coronary artery disease 06/06/2018   1 artery 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 Mamie Nick. at [facility] in [place]   Depression    Diabetes mellitus without complication ([place])    Dyspnea    Dysrhythmia    GERD (gastroesophageal reflux disease)    Hypertension    Hypothyroidism    MALT (mucosa associated lymphoid tissue) ([place]) 07/05/2014   Right neck mass resected 01/2014.   No pertinent past medical history    Personal history of radiation therapy    Sleep  apnea 06/06/2018   O2- 2l at bedtime and BiPap @ bedtime    PAST SURGICAL HISTORY: Past Surgical History:  Procedure Laterality Date   BREAST BIOPSY Left 2/136/2018   INVASIVE MAMMARY CARCINOMA   BREAST BIOPSY Right 05/25/2016   FIBROCYSTIC CHANGE WITH CALCIFICATIONS     BREAST BIOPSY Right 05/29/2020   Affirm bx-"X" clip-FIBROADENOMA WITH ASSOCIATED CALCIFICATIONS. - NEGATIVE   BREAST EXCISIONAL BIOPSY Left 06/04/2016   INVASIVE MAMMARY CARCINOMA.    BREAST LUMPECTOMY Left 06/04/2016   INVASIVE MAMMARY CARCINOMA.    CARDIAC CATHETERIZATION     CARDIAC CATHETERIZATION N/A 09/24/2015   Procedure: Left Heart Cath and Coronary Angiography;  Surgeon: Isaias Cowman, MD;  Location: [facility] [facility];  Service: Cardiovascular;  Laterality: N/A;   CARDIAC CATHETERIZATION N/A 09/24/2015   Procedure: Coronary Stent Intervention;  Surgeon: Isaias Cowman, MD;  Location: [place] [facility];  Service: Cardiovascular;  Laterality: N/A;   CHOLECYSTECTOMY  11/26   08/1970   COLONOSCOPY WITH PROPOFOL N/A 07/08/2021   Procedure: COLONOSCOPY WITH PROPOFOL;  Surgeon: Toledo, Benay Pike, MD;  Location: [facility];  Service: Gastroenterology;  Laterality: N/A;   DILATATION & CURETTAGE/HYSTEROSCOPY WITH MYOSURE N/A 05/05/2015   Procedure: Hysteroscopy and endometrial curretage;  Surgeon: Boykin Nearing, MD;  Location: [facility];  Service: Gynecology;  Laterality: N/A;   DILATION AND CURETTAGE OF UTERUS     EXCISION MASS FROM NECK  Right    Dr. Tami Ribas   EYE SURGERY  05/29/2020   bil cataract 9/08   HAMMER TOE SURGERY Right    JOINT REPLACEMENT Bilateral 2008, 11/26/ 2012   Partial Knee Replacements   LEFT HEART CATH AND CORONARY ANGIOGRAPHY N/A 10/25/2017   Procedure: LEFT HEART CATH AND CORONARY ANGIOGRAPHY;  Surgeon: Teodoro Spray, MD;  Location: [place] [facility];  Service: Cardiovascular;  Laterality: N/A;   LEFT HEART CATH AND CORONARY ANGIOGRAPHY N/A 10/26/2017   Procedure: LEFT HEART CATH AND CORONARY ANGIOGRAPHY;  Surgeon: Isaias Cowman, MD;  Location: [place] [facility];  Service: Cardiovascular;  Laterality: N/A;   PARTIAL MASTECTOMY WITH NEEDLE LOCALIZATION Left 06/04/2016   Procedure: PARTIAL MASTECTOMY WITH NEEDLE  LOCALIZATION;  Surgeon: Leonie Green, MD;  Location: [facility];  Service: General;  Laterality: Left;   SCLERAL BUCKLE  02/16/2011   Procedure: SCLERAL BUCKLE;  Surgeon: Hayden Pedro, MD;  Location: [facility];  Service: Ophthalmology;  Laterality: Left;  Scleral Buckle Left Eye with Headscope Laser   SENTINEL NODE BIOPSY Left 06/04/2016   Procedure: SENTINEL NODE BIOPSY;  Surgeon: Leonie Green, MD;  Location: [facility];  Service: General;  Laterality: Left;    FAMILY HISTORY Family History  Problem Relation Age of Onset   Breast cancer Mother 60   Heart disease Father    Breast cancer Paternal Aunt 4   Breast cancer Cousin    Anesthesia problems Neg Hx    Hypotension Neg Hx    Malignant hyperthermia Neg Hx    Pseudochol deficiency Neg Hx     GYNECOLOGIC HISTORY:  No LMP recorded. Patient is postmenopausal.     ADVANCED DIRECTIVES:    HEALTH MAINTENANCE: Social History   Tobacco Use   Smoking status: Never   Smokeless tobacco: Never  Vaping Use   Vaping Use: Never used  Substance Use Topics   Alcohol use: No   Drug use: No     Colonoscopy:  PAP:  Bone density:  Mammogram: November 2016  Allergies  Allergen Reactions   Codeine  Other (See Comments)    Stroke like symptoms   Tramadol Itching    Current Outpatient Medications  Medication Sig Dispense Refill   acetaminophen (TYLENOL) 500 MG tablet Take 500 mg by mouth every 4 (four) hours as needed.     acidophilus (RISAQUAD) CAPS capsule Take 1 capsule by mouth daily. (Patient not taking: Reported on 12/15/2020)     amLODipine (NORVASC) 5 MG tablet Take 5 mg by mouth daily.     apixaban (ELIQUIS) 5 MG TABS tablet Take 5 mg by mouth 2 (two) times daily.     aspirin 81 MG EC tablet Take 81 mg by mouth daily.      atorvastatin (LIPITOR) 40 MG tablet Take 1 tablet (40 mg total) by mouth at bedtime.     diltiazem (CARDIZEM) 30 MG tablet Take 30 mg by mouth 2 (two) times daily as needed (for breakthrough  afib).  (Patient not taking: Reported on 07/08/2021)     famotidine (PEPCID) 40 MG tablet Take 40 mg by mouth at bedtime.     ferrous sulfate 325 (65 FE) MG tablet Take 325 mg by mouth once a week.     Fluticasone-Salmeterol (ADVAIR) 100-50 MCG/DOSE AEPB Inhale 1 puff into the lungs 2 (two) times daily as needed (for asthma.).     isosorbide mononitrate (IMDUR) 30 MG 24 hr tablet Take 30 mg by mouth daily.     letrozole (FEMARA) 2.5 MG tablet TAKE 1 TABLET BY MOUTH ONCE DAILY 90 tablet 3   levothyroxine (SYNTHROID) 125 MCG tablet Take 125 mcg by mouth daily.     metoprolol (TOPROL-XL) 100 MG 24 hr tablet Take 50 mg by mouth 2 (two) times daily. (Patient not taking: Reported on 12/15/2020)     nitroGLYCERIN (NITROSTAT) 0.4 MG SL tablet Place 0.4 mg under the tongue every 5 (five) minutes as needed for chest pain. Maximum 3 doses, If no relief call md or 911.     oxybutynin (DITROPAN) 5 MG tablet Take 5 mg by mouth at bedtime.      potassium chloride (KLOR-CON) 10 MEQ tablet Take 1 tablet (10 mEq total) by mouth 2 (two) times daily.     RABEprazole (ACIPHEX) 20 MG tablet Take 20 mg by mouth daily.     sertraline (ZOLOFT) 50 MG tablet Take 50 mg by mouth at bedtime.     sotalol (BETAPACE) 80 MG tablet Take 80 mg by mouth 2 (two) times daily.     torsemide (DEMADEX) 20 MG tablet Take 10 mg by mouth as directed. Take on Tuesday and Friday     No current facility-administered medications for this visit.    OBJECTIVE: There were no vitals taken for this visit.   There is no height or weight on file to calculate BMI.    ECOG FS:0 - Asymptomatic  General: Well-developed, well-nourished, no acute distress. Eyes: Pink conjunctiva, anicteric sclera. HEENT: Normocephalic, moist mucous membranes.  Easily palpable cervical lymphadenopathy. Lungs: No audible wheezing or coughing. Heart: Regular rate and rhythm. Abdomen: Soft, nontender, no obvious distention. Musculoskeletal: No edema, cyanosis, or  clubbing. Neuro: Alert, answering all questions appropriately. Cranial nerves grossly intact. Skin: No rashes or petechiae noted. Psych: Normal affect.  LAB RESULTS:   STUDIES: [place] CORE BIOPSY (LYMPH NODES)  Result Date: 08/31/2021 INDICATION: History of lymphoma.  Hypermetabolic adenopathy on PET-CT EXAM: ULTRASOUND-GUIDED RIGHT CERVICAL NODAL MASS BIOPSY COMPARISON:  PET-CT, 08/19/2021.  CT neck, 08/11/2021 MEDICATIONS: None ANESTHESIA/SEDATION: Local anesthetic was administered. COMPLICATIONS: None immediate. TECHNIQUE: Informed written  consent was obtained from the patient and/or patient's representative after a discussion of the risks, benefits and alternatives to treatment. Questions regarding the procedure were encouraged and answered. Initial ultrasound scanning demonstrated hypervascular RIGHT cervical nodal mass. An ultrasound image was saved for documentation purposes. The procedure was planned. A timeout was performed prior to the initiation of the procedure. The operative was prepped and draped in the usual sterile fashion, and a sterile drape was applied covering the operative field. A timeout was performed prior to the initiation of the procedure. Local anesthesia was provided with 1% lidocaine with epinephrine. Under direct ultrasound guidance, an 18 gauge core needle device was utilized to obtain to obtain 3 core needle biopsies of the RIGHT cervical nodal mass. The samples were placed in saline and submitted to pathology. The needle was removed and hemostasis was achieved with manual compression. Post procedure scan was negative for significant hematoma. A dressing was placed. The patient tolerated the procedure well without immediate postprocedural complication. IMPRESSION: Successful ultrasound guided biopsy of the RIGHT cervical nodal mass, as above. Michaelle Birks, MD [facility] Electronically Signed   By: Michaelle Birks M.D.    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  Now with widespread lymphadenopathy.  PLAN:    1.  Lymphadenopathy: PET scan results from Aug 19, 2021 reviewed independently and report as above with widespread FDG avid lymphadenopathy concerning for either lymphoma recurrence or a new primary.  Patient will require biopsy to confirm.  Return to clinic 1 week after biopsy to discuss the results and treatment planning. 2. 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 MammaPrint was reported as low risk, therefore she did not require adjuvant chemotherapy. She completed adjuvant XRT.  Continue letrozole for a total of 5 years completing treatment in July 2023.  Her most recent mammogram on May 20, 2021 was reported as BI-RADS 1.  Repeat in March 2024.   3.  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  PET scan results as above.  Patient will require repeat biopsy.   3.  Left renal lesion: CT scan results from December 15, 2020 reviewed independently and report as above with only mild increase of benign renal lesion to 13 mm.  No intervention is needed at this time. 4.  Bone mineral density: Patient's most recent bone mineral density on November 13, 2020 revealed a T score of -2.0 which is slightly worse than 1 year prior where her T score was reported -1.7.  Continue calcium and vitamin D supplementation.  Repeat in August 2023.    Patient expressed understanding and was in agreement with this plan. She also understands that She can call clinic at any time with any questions, concerns, or complaints.     Lloyd Huger,  MD   09/04/2021 12:29 PM

## 2021-09-07 ENCOUNTER — Telehealth: Payer: Self-pay | Admitting: *Deleted

## 2021-09-07 ENCOUNTER — Other Ambulatory Visit: Payer: Self-pay

## 2021-09-07 ENCOUNTER — Inpatient Hospital Stay: Payer: BC Managed Care – PPO

## 2021-09-07 ENCOUNTER — Inpatient Hospital Stay (HOSPITAL_BASED_OUTPATIENT_CLINIC_OR_DEPARTMENT_OTHER): Payer: BC Managed Care – PPO | Admitting: Nurse Practitioner

## 2021-09-07 ENCOUNTER — Ambulatory Visit
Admission: RE | Admit: 2021-09-07 | Discharge: 2021-09-07 | Disposition: A | Discharge status: Home / Self Care | Payer: BC Managed Care – PPO | Source: Ambulatory Visit | Attending: Medical Oncology | Admitting: Medical Oncology

## 2021-09-07 ENCOUNTER — Other Ambulatory Visit: Payer: Self-pay | Admitting: Medical Oncology

## 2021-09-07 ENCOUNTER — Ambulatory Visit
Admission: RE | Admit: 2021-09-07 | Discharge: 2021-09-07 | Disposition: A | Discharge status: Home / Self Care | Payer: BC Managed Care – PPO | Attending: Oncology | Admitting: Oncology

## 2021-09-07 VITALS — BP 120/70 | HR 62 | Temp 98.7°F | Resp 18 | Wt 172.5 lb

## 2021-09-07 DIAGNOSIS — R051 Acute cough: Secondary | ICD-10-CM

## 2021-09-07 DIAGNOSIS — C8598 Non-Hodgkin lymphoma, unspecified, lymph nodes of multiple sites: Secondary | ICD-10-CM | POA: Diagnosis not present

## 2021-09-07 DIAGNOSIS — Z5111 Encounter for antineoplastic chemotherapy: Secondary | ICD-10-CM | POA: Diagnosis not present

## 2021-09-07 DIAGNOSIS — C50412 Malignant neoplasm of upper-outer quadrant of left female breast: Secondary | ICD-10-CM

## 2021-09-07 LAB — COMPREHENSIVE METABOLIC PANEL
ALT: 36 U/L (ref 0–44)
AST: 46 U/L — ABNORMAL HIGH (ref 15–41)
Albumin: 3.1 g/dL — ABNORMAL LOW (ref 3.5–5.0)
Alkaline Phosphatase: 190 U/L — ABNORMAL HIGH (ref 38–126)
Anion gap: 12 (ref 5–15)
BUN: 10 mg/dL (ref 8–23)
CO2: 25 mmol/L (ref 22–32)
Calcium: 9.5 mg/dL (ref 8.9–10.3)
Chloride: 98 mmol/L (ref 98–111)
Creatinine, Ser: 0.85 mg/dL (ref 0.44–1.00)
GFR, Estimated: >60 mL/min (ref >60)
Glucose, Bld: 105 mg/dL — ABNORMAL HIGH (ref 70–99)
Potassium: 4.5 mmol/L (ref 3.5–5.1)
Sodium: 135 mmol/L (ref 135–145)
Total Bilirubin: 0.6 mg/dL (ref 0.3–1.2)
Total Protein: 7.5 g/dL (ref 6.5–8.1)

## 2021-09-07 LAB — CBC WITH DIFFERENTIAL/PLATELET
Abs Immature Granulocytes: 0.04 10*3/uL (ref 0.00–0.07)
Basophils Absolute: 0 10*3/uL (ref 0.0–0.1)
Basophils Relative: 1 %
Eosinophils Absolute: 0.2 10*3/uL (ref 0.0–0.5)
Eosinophils Relative: 3 %
HCT: 38.9 % (ref 36.0–46.0)
Hemoglobin: 12.3 g/dL (ref 12.0–15.0)
Immature Granulocytes: 1 %
Lymphocytes Relative: 15 %
Lymphs Abs: 1.3 10*3/uL (ref 0.7–4.0)
MCH: 24.7 pg — ABNORMAL LOW (ref 26.0–34.0)
MCHC: 31.6 g/dL (ref 30.0–36.0)
MCV: 78.1 fL — ABNORMAL LOW (ref 80.0–100.0)
Monocytes Absolute: 0.9 10*3/uL (ref 0.1–1.0)
Monocytes Relative: 11 %
Neutro Abs: 6.2 10*3/uL (ref 1.7–7.7)
Neutrophils Relative %: 69 %
Platelets: 278 10*3/uL (ref 150–400)
RBC: 4.98 MIL/uL (ref 3.87–5.11)
RDW: 15.2 % (ref 11.5–15.5)
WBC: 8.7 10*3/uL (ref 4.0–10.5)
nRBC: 0 % (ref 0.0–0.2)

## 2021-09-07 NOTE — Progress Notes (Unsigned)
[facility] [place] [facility] [place]. 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 MammaPrint was reported as low risk, therefore she did not require adjuvant chemotherapy. She completed adjuvant XRT. Will complete 5 year of letrozole in July 2023.   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  Patient's most recent CT scan on April 28, 2018 revealed no evidence of progressive or recurrent disease.    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  Symptoms have been present for several weeks and are gradually worsening.  Cough occurs both day and night.  Was treated for respiratory infection by her PCP with no improvement in symptoms.  Also complains of hot flashes, night sweats, unintentional weight  loss, early satiety.  She has ongoing swelling of the right neck and recently underwent a biopsy and PET scan. She is accompanied by her daughter.   ECOG FS:2 - Symptomatic, <50% confined to bed  Review of systems- Review of Systems  Constitutional:  Positive for chills, malaise/fatigue and weight loss. Negative for fever.  HENT:  Negative for hearing loss, nosebleeds, sore throat and tinnitus.   Eyes:  Negative for blurred vision and double vision.  Respiratory:  Positive for cough and shortness of breath. Negative for hemoptysis, sputum production and wheezing.   Cardiovascular:  Negative for chest pain, palpitations and leg swelling.  Gastrointestinal:  Positive for abdominal pain. Negative for blood in stool, constipation, diarrhea, melena, nausea and vomiting.  Genitourinary:  Negative for dysuria and urgency.  Musculoskeletal:  Negative for back pain, falls, joint pain and myalgias.  Skin:  Negative for itching and rash.  Neurological:  Positive for weakness. Negative for dizziness, tingling, sensory change, loss of consciousness and headaches.  Endo/Heme/Allergies:  Negative for environmental allergies. Does not bruise/bleed easily.  Psychiatric/Behavioral:  Negative for depression. The patient is not nervous/anxious and does not have insomnia.     Current treatment- aromatase inhibitor (completes 5 years of adjuvant treatment in July 2023)  Allergies  Allergen Reactions   Codeine Other (See Comments)    Stroke like symptoms   Tramadol Itching    Past Medical History:  Diagnosis Date   Abdominal pain, left lower quadrant    epiploic appendagitis   Anginal pain (HCC)    Arthritis 06/06/2018   knee   Asthma    mild   Atrial fibrillation ([place])    Breast  cancer [facility])    Coronary artery disease 06/06/2018   1 artery 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 Mamie Nick. at [facility] in [place]   Depression    Diabetes mellitus without complication ([place])    Dyspnea    Dysrhythmia     GERD (gastroesophageal reflux disease)    Hypertension    Hypothyroidism    MALT (mucosa associated lymphoid tissue) ([place]) 07/05/2014   Right neck mass resected 01/2014.   No pertinent past medical history    Personal history of radiation therapy    Sleep apnea 06/06/2018   O2- 2l at bedtime and BiPap @ bedtime    Past Surgical History:  Procedure Laterality Date   BREAST BIOPSY Left 2/136/2018   INVASIVE MAMMARY CARCINOMA   BREAST BIOPSY Right 05/25/2016   FIBROCYSTIC CHANGE WITH CALCIFICATIONS    BREAST BIOPSY Right 05/29/2020   Affirm bx-"X" clip-FIBROADENOMA WITH ASSOCIATED CALCIFICATIONS. - NEGATIVE   BREAST EXCISIONAL BIOPSY Left 06/04/2016   INVASIVE MAMMARY CARCINOMA.    BREAST LUMPECTOMY Left 06/04/2016   INVASIVE MAMMARY CARCINOMA.    CARDIAC CATHETERIZATION     CARDIAC CATHETERIZATION N/A 09/24/2015   Procedure: Left Heart Cath and Coronary Angiography;  Surgeon: Isaias Cowman, MD;  Location: [place] [facility];  Service: Cardiovascular;  Laterality: N/A;   CARDIAC CATHETERIZATION N/A 09/24/2015   Procedure: Coronary Stent Intervention;  Surgeon: Isaias Cowman, MD;  Location: [place] [facility];  Service: Cardiovascular;  Laterality: N/A;   CHOLECYSTECTOMY  11/26   08/1970   COLONOSCOPY WITH PROPOFOL N/A 07/08/2021   Procedure: COLONOSCOPY WITH PROPOFOL;  Surgeon: Toledo, Benay Pike, MD;  Location: [facility];  Service: Gastroenterology;  Laterality: N/A;   DILATATION & CURETTAGE/HYSTEROSCOPY WITH MYOSURE N/A 05/05/2015   Procedure: Hysteroscopy and endometrial curretage;  Surgeon: Boykin Nearing, MD;  Location: [facility];  Service: Gynecology;  Laterality: N/A;   DILATION AND CURETTAGE OF UTERUS     EXCISION MASS FROM NECK  Right    Dr. Tami Ribas   EYE SURGERY  05/29/2020   bil cataract 9/08   HAMMER TOE SURGERY Right    JOINT REPLACEMENT Bilateral 2008, 11/26/ 2012   Partial Knee Replacements   LEFT HEART CATH AND CORONARY ANGIOGRAPHY N/A  10/25/2017   Procedure: LEFT HEART CATH AND CORONARY ANGIOGRAPHY;  Surgeon: Teodoro Spray, MD;  Location: [place];  Service: Cardiovascular;  Laterality: N/A;   LEFT HEART CATH AND CORONARY ANGIOGRAPHY N/A 10/26/2017   Procedure: LEFT HEART CATH AND CORONARY ANGIOGRAPHY;  Surgeon: Isaias Cowman, MD;  Location: [place] [facility];  Service: Cardiovascular;  Laterality: N/A;   PARTIAL MASTECTOMY WITH NEEDLE LOCALIZATION Left 06/04/2016   Procedure: PARTIAL MASTECTOMY WITH NEEDLE LOCALIZATION;  Surgeon: Leonie Green, MD;  Location: [facility];  Service: General;  Laterality: Left;   SCLERAL BUCKLE  02/16/2011   Procedure: SCLERAL BUCKLE;  Surgeon: Hayden Pedro, MD;  Location: [place];  Service: Ophthalmology;  Laterality: Left;  Scleral Buckle Left Eye with Headscope Laser   SENTINEL NODE BIOPSY Left 06/04/2016   Procedure: SENTINEL NODE BIOPSY;  Surgeon: Leonie Green, MD;  Location: [facility];  Service: General;  Laterality: Left;    Social History   Socioeconomic History   Marital status: Widowed    Spouse name: Not on file   Number of children: Not on file   Years of education: Not on file   Highest education level: Not on file  Occupational History   Not on file  Tobacco Use  Smoking status: Never   Smokeless tobacco: Never  Vaping Use   Vaping Use: Never used  Substance and Sexual Activity   Alcohol use: No   Drug use: No   Sexual activity: Not Currently  Other Topics Concern   Not on file  Social History Narrative   Not on file   Social Determinants of Health   Financial Resource Strain: Not on file  Food Insecurity: Not on file  Transportation Needs: Not on file  Physical Activity: Not on file  Stress: Not on file  Social Connections: Not on file  Intimate Partner Violence: Not on file    Family History  Problem Relation Age of Onset   Breast cancer Mother 48   Heart disease Father    Breast cancer Paternal Aunt 35   Breast  cancer Cousin    Anesthesia problems Neg Hx    Hypotension Neg Hx    Malignant hyperthermia Neg Hx    Pseudochol deficiency Neg Hx      Current Outpatient Medications:    acetaminophen (TYLENOL) 500 MG tablet, Take 500 mg by mouth every 4 (four) hours as needed., Disp: , Rfl:    acidophilus (RISAQUAD) CAPS capsule, Take 1 capsule by mouth daily. (Patient not taking: Reported on 12/15/2020), Disp: , Rfl:    amLODipine (NORVASC) 5 MG tablet, Take 5 mg by mouth daily., Disp: , Rfl:    amoxicillin (AMOXIL) 875 MG tablet, Take 875 mg by mouth 2 (two) times daily., Disp: , Rfl:    apixaban (ELIQUIS) 5 MG TABS tablet, Take 5 mg by mouth 2 (two) times daily., Disp: , Rfl:    aspirin 81 MG EC tablet, Take 81 mg by mouth daily. , Disp: , Rfl:    atorvastatin (LIPITOR) 40 MG tablet, Take 1 tablet (40 mg total) by mouth at bedtime., Disp: , Rfl:    diltiazem (CARDIZEM) 30 MG tablet, Take 30 mg by mouth 2 (two) times daily as needed (for breakthrough afib).  (Patient not taking: Reported on 07/08/2021), Disp: , Rfl:    famotidine (PEPCID) 40 MG tablet, Take 40 mg by mouth at bedtime., Disp: , Rfl:    ferrous sulfate 325 (65 FE) MG tablet, Take 325 mg by mouth once a week., Disp: , Rfl:    Fluticasone-Salmeterol (ADVAIR) 100-50 MCG/DOSE AEPB, Inhale 1 puff into the lungs 2 (two) times daily as needed (for asthma.)., Disp: , Rfl:    isosorbide mononitrate (IMDUR) 30 MG 24 hr tablet, Take 30 mg by mouth daily., Disp: , Rfl:    letrozole (FEMARA) 2.5 MG tablet, TAKE 1 TABLET BY MOUTH ONCE DAILY, Disp: 90 tablet, Rfl: 3   levothyroxine (SYNTHROID) 125 MCG tablet, Take 125 mcg by mouth daily., Disp: , Rfl:    metoprolol (TOPROL-XL) 100 MG 24 hr tablet, Take 50 mg by mouth 2 (two) times daily. (Patient not taking: Reported on 12/15/2020), Disp: , Rfl:    nitroGLYCERIN (NITROSTAT) 0.4 MG SL tablet, Place 0.4 mg under the tongue every 5 (five) minutes as needed for chest pain. Maximum 3 doses, If no relief call md or  911., Disp: , Rfl:    oxybutynin (DITROPAN) 5 MG tablet, Take 5 mg by mouth at bedtime. , Disp: , Rfl:    potassium chloride (KLOR-CON) 10 MEQ tablet, Take 1 tablet (10 mEq total) by mouth 2 (two) times daily., Disp: , Rfl:    RABEprazole (ACIPHEX) 20 MG tablet, Take 20 mg by mouth daily., Disp: , Rfl:    sertraline (  ZOLOFT) 50 MG tablet, Take 50 mg by mouth at bedtime., Disp: , Rfl:    sotalol (BETAPACE) 80 MG tablet, Take 80 mg by mouth 2 (two) times daily., Disp: , Rfl:    torsemide (DEMADEX) 20 MG tablet, Take 10 mg by mouth as directed. Take on Tuesday and Friday, Disp: , Rfl:   Physical exam:  Vitals:   09/07/21 1317  BP: 120/70  Pulse: 62  Resp: 18  Temp: 98.7 F (37.1 C)  TempSrc: Tympanic  SpO2: 94%  Weight: 172 lb 8 oz (78.2 kg)   Physical Exam Constitutional:      General: She is not in acute distress.    Appearance: She is well-developed. She is not ill-appearing.     Comments: Fatigued appearing. Accompanied by daughter.   HENT:     Head: Atraumatic.     Mouth/Throat:     Pharynx: No oropharyngeal exudate.  Eyes:     General: No scleral icterus.    Conjunctiva/sclera: Conjunctivae normal.  Cardiovascular:     Rate and Rhythm: Normal rate and regular rhythm.  Pulmonary:     Effort: Pulmonary effort is normal.     Breath sounds: Normal breath sounds.  Abdominal:     Palpations: Abdomen is soft.     Tenderness: There is no abdominal tenderness.     Comments: rounded  Musculoskeletal:        General: No tenderness or deformity.  Lymphadenopathy:     Cervical: Cervical adenopathy present.  Skin:    General: Skin is warm and dry.     Coloration: Skin is not pale.  Neurological:     General: No focal deficit present.     Mental Status: She is alert and oriented to person, place, and time.  Psychiatric:        Mood and Affect: Mood normal.        Behavior: Behavior normal.        Latest Ref Rng & Units 09/07/2021   12:51 PM  CMP  Glucose 70 - 99 mg/dL  105   BUN 8 - 23 mg/dL 10   Creatinine 0.44 - 1.00 mg/dL 0.85   Sodium 135 - 145 mmol/L 135   Potassium 3.5 - 5.1 mmol/L 4.5   Chloride 98 - 111 mmol/L 98   CO2 22 - 32 mmol/L 25   Calcium 8.9 - 10.3 mg/dL 9.5   Total Protein 6.5 - 8.1 g/dL 7.5   Total Bilirubin 0.3 - 1.2 mg/dL 0.6   Alkaline Phos 38 - 126 U/L 190   AST 15 - 41 U/L 46   ALT 0 - 44 U/L 36       Latest Ref Rng & Units 09/07/2021   12:51 PM  CBC  WBC 4.0 - 10.5 K/uL 8.7   Hemoglobin 12.0 - 15.0 g/dL 12.3   Hematocrit 36.0 - 46.0 % 38.9   Platelets 150 - 400 K/uL 278     DG Chest 2 View  Result Date: 09/07/2021 CLINICAL DATA:  Cough.  Shortness of breath. EXAM: CHEST - 2 VIEW COMPARISON:  AP chest 05/24/2019 FINDINGS: Cardiac silhouette is unchanged and within normal limits. There is again a tortuous thoracic aorta. Mild calcification within the aortic arch. The lungs are clear. No pleural effusion or pneumothorax. Mild multilevel degenerative disc changes of the thoracic spine. Mild levocurvature of the mid to upper thoracic spine. IMPRESSION: No active cardiopulmonary disease. Electronically Signed   By: Yvonne Kendall M.D.   On: 09/07/2021 11:48  [place] CORE BIOPSY (LYMPH NODES)  Result Date: 08/31/2021 INDICATION: History of lymphoma.  Hypermetabolic adenopathy on PET-CT EXAM: ULTRASOUND-GUIDED RIGHT CERVICAL NODAL MASS BIOPSY COMPARISON:  PET-CT, 08/19/2021.  CT neck, 08/11/2021 MEDICATIONS: None ANESTHESIA/SEDATION: Local anesthetic was administered. COMPLICATIONS: None immediate. TECHNIQUE: Informed written consent was obtained from the patient and/or patient's representative after a discussion of the risks, benefits and alternatives to treatment. Questions regarding the procedure were encouraged and answered. Initial ultrasound scanning demonstrated hypervascular RIGHT cervical nodal mass. An ultrasound image was saved for documentation purposes. The procedure was planned. A timeout was performed prior to the initiation  of the procedure. The operative was prepped and draped in the usual sterile fashion, and a sterile drape was applied covering the operative field. A timeout was performed prior to the initiation of the procedure. Local anesthesia was provided with 1% lidocaine with epinephrine. Under direct ultrasound guidance, an 18 gauge core needle device was utilized to obtain to obtain 3 core needle biopsies of the RIGHT cervical nodal mass. The samples were placed in saline and submitted to pathology. The needle was removed and hemostasis was achieved with manual compression. Post procedure scan was negative for significant hematoma. A dressing was placed. The patient tolerated the procedure well without immediate postprocedural complication. IMPRESSION: Successful ultrasound guided biopsy of the RIGHT cervical nodal mass, as above. Michaelle Birks, MD [facility] Radiology Electronically Signed   By: Michaelle Birks M.D.   On: 08/31/2021 14:55   NM PET Image Initial (PI) Skull Base To Thigh  Result Date: 08/19/2021 CLINICAL DATA:  Subsequent treatment strategy for lymphoma in a 76 year old female also with history of breast cancer in 2018. EXAM: NUCLEAR MEDICINE PET SKULL BASE TO THIGH TECHNIQUE: 9.55 mCi F-18 FDG was injected intravenously. Full-ring PET imaging was performed from the skull base to thigh after the radiotracer. CT data was obtained and used for attenuation correction and anatomic localization. Fasting blood glucose: 86 mg/dl COMPARISON:  CT of the neck 08/11/2021 and multiple prior CT and PET evaluations. Exams. FINDINGS: Mediastinal blood pool activity: SUV max 2.44 Liver activity: SUV max 4.0 NECK: Nodular area in the RIGHT maxillary sinus (image 28/2) is associated with anterior wall of the RIGHT maxillary sinus measuring 17 x 9 mm with a maximum SUV of 23.4. Adenopathy throughout the RIGHT neck with bulky appearance not substantially changed compared to recent  CT imaging and greatest on the RIGHT at level III (image 52/2) 3.9 cm greatest axial dimension with a maximum SUV of 22.3 Lymph nodes track into the RIGHT thoracic inlet from the RIGHT neck all with marked metabolic activity. (Image 41/2) level II/lymph node 17 mm with an SUV of 29.8. Contralateral lymph nodes with very mild increased metabolic activity (image 93/8 LEFT level II lymph node is with a maximum SUV of 5.1. Bilateral supraclavicular lymph nodes (image 64/2 measuring 17 and 18 mm on the RIGHT and LEFT respectively, maximum SUV 12.75 on the RIGHT and 13.65 on the LEFT. Incidental CT findings: none CHEST: RIGHT paratracheal, at LEFT paratracheal, AP window and RIGHT hilar adenopathy with increased metabolic activity showing marked FDG uptake. Subcarinal adenopathy (image 89/2) maximum SUV of 13.6 measuring 23 mm. RIGHT paratracheal lymph nodes tracking from the thoracic inlet along the RIGHT paratracheal chain largest approximately 15 mm with similar marked increased metabolic activity. RIGHT hilar activity with a maximum SUV of 15.8 (image 95/2. Discrete lymph node difficult to measure in this area. RIGHT axillary lymph nodes with less pronounced metabolic  activity tracking from RIGHT thoracic inlet beneath subpectoral lymph nodes on the RIGHT into the RIGHT axilla, dominant lymph node is a RIGHT subpectoral node with a maximum SUV of 4.7 (image 74/2 measuring approximately 10 mm. Retrocrural lymph nodes also with mild enlargement and moderate to marked increased metabolic activity contiguous with celiac and retroperitoneal nodal disease. No suspicious pulmonary nodules. Incidental CT findings: Aortic atherosclerosis. Moderate cardiomegaly. No pericardial effusion or nodularity. Central pulmonary vasculature unremarkable on noncontrast imaging. Basilar atelectasis. Airways are patent. ABDOMEN/PELVIS: Adenopathy tracks throughout the abdomen in the retroperitoneum and also involving celiac and periportal  lymph nodes (image 134/2) diffuse increased metabolic activity associated with nodal disease tracking into the porta hepatis and along celiac branches open parent image 136/2) dominant lymph node 19 mm short axis with a maximum SUV of 16.9, similar increased metabolic activity associated with attic gastric and other celiac nodes seen on the current exam. Bulky LEFT periaortic adenopathy (image 159/2) 19 mm short axis with a maximum SUV of 14.81. Intra-aortocaval adenopathy with with a maximum SUV of 30.3 (image 149/2) this measures 15 mm. No signs of adenopathy in the pelvis. Focal area of uptake in the cephalad spleen (image 121/2) maximum SUV of 5.3 without corresponding visible CT lesion though with mild lobulation of splenic contour in this location measuring approximately 13 mm. Heterogeneity in general of the spleen without signs of splenomegaly. Incidental CT findings: No acute findings relative to liver, pancreas, spleen, adrenal glands, kidneys, stomach, small or large bowel. There is colonic diverticulosis and there is mild aortic atherosclerosis. SKELETON: No focal hypermetabolic activity to suggest skeletal metastasis. Incidental CT findings: none IMPRESSION: Extensive adenopathy involving the neck, chest and abdomen with SUV values as high as 30.3, suspicious for lymphoma recurrence or metastatic process. Most accessible lymph nodes are present in the neck. Splenic uptake with focal characteristics is more suggestive of lymphoma. RIGHT maxillary sinus nodularity with marked increased metabolic activity along the anterior wall of the RIGHT maxillary sinus, while atypical is suspicious for additional site of involvement. Aortic Atherosclerosis (ICD10-I70.0). Electronically Signed   By: Zetta Bills M.D.   On: 08/19/2021 13:40   CT SOFT TISSUE NECK W CONTRAST  Result Date: 08/12/2021 CLINICAL DATA:  Anterior cervical lymphadenopathy with soreness for 3 weeks. History of non-Hodgkin's lymphoma and  breast cancer. EXAM: CT NECK WITH CONTRAST TECHNIQUE: Multidetector CT imaging of the neck was performed using the standard protocol following the bolus administration of intravenous contrast. RADIATION DOSE REDUCTION: This exam was performed according to the departmental dose-optimization program which includes automated exposure control, adjustment of the mA and/or kV according to patient size and/or use of iterative reconstruction technique. CONTRAST:  22m ISOVUE-300 IOPAMIDOL (ISOVUE-300) INJECTION 61% COMPARISON:  04/28/2018 chest CT and 10/03/2013 neck CT FINDINGS: Pharynx and larynx: No thickening of Waldeyer's ring. No evidence of mucosal mass or inflammation Salivary glands: Right submandibular gland atrophy or prior resection. No acute inflammation or mass Thyroid: Symmetric atrophy. Lymph nodes: Diffuse right jugular chain lymphadenopathy with large lobulated nodes which show homogeneous enhancement despite a right mid chain node measuring up to 3.8 cm in length. Lower left jugular chain adenopathy at the supraclavicular fossa measuring up to 2.8 cm. There is continuation of enlarged nodes in the mediastinum. Partial coverage of the deep right axilla shows adenopathy with node measuring up to 19 mm in length. Vascular: No acute finding Limited intracranial: Negative. Visualized orbits: Essentially not covered. Postoperative appearance of the left globe. Mastoids and visualized paranasal sinuses: Partial coverage  shows asymmetric rightward opacification of sinuses, confluent at the visible right posterior ethmoid and sphenoid sinuses. Lobulated density in the right maxillary sinus, usually retention cyst. Skeleton: Cervical spine degeneration. No acute or aggressive finding. Upper chest: Negative These results will be called to the ordering clinician or representative by the Radiologist Assistant, and communication documented in the PACS or [employer]. IMPRESSION: Widespread lymphadenopathy  involving the right more than left neck, mediastinum, and right axilla. Recurrent lymphoma or metastatic disease are both possible, favor lymphoma. Sinus opacification asymmetric to the right. Electronically Signed   By: Jorje Guild M.D.   On: 08/12/2021 11:14    Assessment and plan- Patient is a 76 y.o. 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 PET on 08/19/21 demonstrated extensive adenopathy of neck, chest, abdomen, right maxillary sinus, and spleen. She underwent right cervical lymph node biopsy. Pathology consistent with diffuse large b cell lymphoma. Reviewed pathology with Dr. Grayland Ormond. He is currently awaiting comparison of current lymphoma with her 2016 diagnosis of MALT lymphoma however most likely new primary. Reviewed diagnosis briefly today with patient and family as well as recommendation for chemotherapy, likely R-CHOP. Recommend additional workup including bone marrow biopsy. We discussed what to expect with bone marrow including possible risks of infection, pain, minimal blood loss, discomfort following the procedure. Patient wishes to proceed. Given recommendation for chemotherapy, recommend port placement which can occur at the same time as the bone marrow biopsy. Again reviewed risks including pain, infection, thrombosis. Patient wishes to proceed. She will see Dr. Grayland Ormond for full discussion of pathology, treatment planning later this week. Cough- likely secondary to lymphadenopathy of chest and thoracic inlet. No pulmonary nodules were identifies. Reviewed no role for antibiotics. Can trial tussionex for cough though limited clinical utility. Reviewed narcotic risks & precautions.  Weakness- secondary to disease and weight loss. Recommended patient not drive. Additionally, she works full time and is required to climb 20+ stairs to get to her office. Encouraged her to consider remote work to avoid stairs and  limiting work hours.   Disposition: Ref for port placement & bone marrow biopsy Follow up with Dr. Grayland Ormond as scheduled later this week   Visit Diagnosis 1. Lymphoma of lymph nodes of multiple regions, unspecified lymphoma type ([place])   2. Acute cough     Patient expressed understanding and was in agreement with this plan. She also understands that She can call clinic at any time with any questions, concerns, or complaints.   Thank you for allowing me to participate in the care of this very pleasant patient.   Beckey Rutter, DNP, AGNP-C [facility] [place]

## 2021-09-07 NOTE — Telephone Encounter (Signed)
Patient called reporting that she is sick, weak and not eating. When questioned further, she states that she has early satiety and can only take a few bites when she tries to eat. She states she feels her weakness is coming form not eating. She states she does NOT have fever, She still has a cough, she is very tired. She is asking if there is anything we can do for her. She knows she has an appointment for results Wednesday, but would like to have something done to make her feel better before then if possible. Please advise

## 2021-09-07 NOTE — Telephone Encounter (Signed)
Spoke to patient and she is agreeable to cxr and smc. Appointments scheduled.

## 2021-09-07 NOTE — Progress Notes (Unsigned)
Added to smc today with concerns of weakness/fatigue, cough, poor appetite, early satiety, shortness of breath, and losing control of her bowels with diarrhea. Reports that symptoms have been ongoing for about 2 weeks, but gradually worsening. Also reports that she took last dose of amoxicillin course today, which was prescribed by pcp.

## 2021-09-08 ENCOUNTER — Other Ambulatory Visit: Payer: Self-pay | Admitting: Radiology

## 2021-09-08 ENCOUNTER — Telehealth: Payer: Self-pay | Admitting: *Deleted

## 2021-09-08 DIAGNOSIS — C884 Extranodal marginal zone b-cell lymphoma of mucosa-associated lymphoid tissue (malt-lymphoma) not having achieved remission: Secondary | ICD-10-CM

## 2021-09-08 MED ORDER — HYDROCOD POLI-CHLORPHE POLI ER 10-8 MG/5ML PO SUER: 5.0000 mL | Freq: Every evening | ORAL | 0 refills | Status: DC | PRN

## 2021-09-08 NOTE — Telephone Encounter (Signed)
Call returned to patient and she said pharmacy just called her and that her insurance will not cover it and the cost is $140. Can something else be ordered?

## 2021-09-08 NOTE — Telephone Encounter (Signed)
I called and updated patient on this matter and suggested she can try over the counter medicine like Delsym until we can get her prescription approved or chenged

## 2021-09-08 NOTE — Telephone Encounter (Signed)
Patient called stating that she was to have had cough medicine prescription sent to [facility], but they have not received anything from [place]. Please advise

## 2021-09-08 NOTE — Progress Notes (Signed)
Spoke with patient 09/08/21 @ 11:30 am. Stacey Stone over pre-procedure instructions to include the need to arrive at 7:30 for 8:30 procedure, need to be NPO after midnight, need to hold Eliquis and Aspirin morning of procedure and need for driver post-procedure. Patient verbalized understanding.

## 2021-09-09 ENCOUNTER — Encounter: Payer: Self-pay | Admitting: Radiology

## 2021-09-09 ENCOUNTER — Inpatient Hospital Stay: Payer: BC Managed Care – PPO | Admitting: Oncology

## 2021-09-09 ENCOUNTER — Ambulatory Visit
Admission: RE | Admit: 2021-09-09 | Discharge: 2021-09-09 | Disposition: A | Discharge status: Home / Self Care | Payer: BC Managed Care – PPO | Source: Ambulatory Visit | Attending: Nurse Practitioner | Admitting: Nurse Practitioner

## 2021-09-09 ENCOUNTER — Other Ambulatory Visit: Payer: Self-pay

## 2021-09-09 DIAGNOSIS — C8598 Non-Hodgkin lymphoma, unspecified, lymph nodes of multiple sites: Secondary | ICD-10-CM | POA: Diagnosis present

## 2021-09-09 DIAGNOSIS — Z853 Personal history of malignant neoplasm of breast: Secondary | ICD-10-CM | POA: Diagnosis not present

## 2021-09-09 DIAGNOSIS — Z8572 Personal history of non-Hodgkin lymphomas: Secondary | ICD-10-CM | POA: Diagnosis not present

## 2021-09-09 DIAGNOSIS — D508 Other iron deficiency anemias: Secondary | ICD-10-CM | POA: Insufficient documentation

## 2021-09-09 DIAGNOSIS — Z923 Personal history of irradiation: Secondary | ICD-10-CM | POA: Insufficient documentation

## 2021-09-09 DIAGNOSIS — C884 Extranodal marginal zone B-cell lymphoma of mucosa-associated lymphoid tissue [MALT-lymphoma]: Secondary | ICD-10-CM

## 2021-09-09 DIAGNOSIS — R590 Localized enlarged lymph nodes: Secondary | ICD-10-CM

## 2021-09-09 HISTORY — PX: IR BONE MARROW BIOPSY & ASPIRATION: IMG5727

## 2021-09-09 HISTORY — PX: IR IMAGING GUIDED PORT INSERTION: IMG5740

## 2021-09-09 LAB — CBC WITH DIFFERENTIAL/PLATELET
Abs Immature Granulocytes: 0.06 10*3/uL (ref 0.00–0.07)
Basophils Absolute: 0.1 10*3/uL (ref 0.0–0.1)
Basophils Relative: 1 %
Eosinophils Absolute: 0.4 10*3/uL (ref 0.0–0.5)
Eosinophils Relative: 5 %
HCT: 38.3 % (ref 36.0–46.0)
Hemoglobin: 11.8 g/dL — ABNORMAL LOW (ref 12.0–15.0)
Immature Granulocytes: 1 %
Lymphocytes Relative: 16 %
Lymphs Abs: 1.5 10*3/uL (ref 0.7–4.0)
MCH: 23.9 pg — ABNORMAL LOW (ref 26.0–34.0)
MCHC: 30.8 g/dL (ref 30.0–36.0)
MCV: 77.5 fL — ABNORMAL LOW (ref 80.0–100.0)
Monocytes Absolute: 1 10*3/uL (ref 0.1–1.0)
Monocytes Relative: 11 %
Neutro Abs: 6.3 10*3/uL (ref 1.7–7.7)
Neutrophils Relative %: 66 %
Platelets: 334 10*3/uL (ref 150–400)
RBC: 4.94 MIL/uL (ref 3.87–5.11)
RDW: 15.2 % (ref 11.5–15.5)
WBC: 9.2 10*3/uL (ref 4.0–10.5)
nRBC: 0 % (ref 0.0–0.2)

## 2021-09-09 LAB — GLUCOSE, CAPILLARY: Glucose-Capillary: 102 mg/dL — ABNORMAL HIGH (ref 70–99)

## 2021-09-09 MED ORDER — FENTANYL CITRATE (PF) 100 MCG/2ML IJ SOLN
INTRAMUSCULAR | Status: AC | PRN
  Administered 2021-09-09: 50 ug via INTRAVENOUS

## 2021-09-09 MED ORDER — LIDOCAINE 1% INJECTION FOR CIRCUMCISION
INJECTION | INTRAVENOUS | Status: DC | PRN
  Administered 2021-09-09: 10 mL via SUBCUTANEOUS

## 2021-09-09 MED ORDER — FENTANYL CITRATE (PF) 100 MCG/2ML IJ SOLN
INTRAMUSCULAR | Status: AC
  Filled 2021-09-09: qty 2

## 2021-09-09 MED ORDER — SODIUM CHLORIDE 0.9 % IV SOLN
INTRAVENOUS | Status: DC
  Filled 2021-09-09: qty 1000

## 2021-09-09 MED ORDER — MIDAZOLAM HCL 2 MG/2ML IJ SOLN
INTRAMUSCULAR | Status: AC
  Filled 2021-09-09: qty 2

## 2021-09-09 MED ORDER — MIDAZOLAM HCL 2 MG/2ML IJ SOLN
INTRAMUSCULAR | Status: AC | PRN
  Administered 2021-09-09: 1 mg via INTRAVENOUS

## 2021-09-09 MED ORDER — MIDAZOLAM HCL 2 MG/2ML IJ SOLN
INTRAMUSCULAR | Status: DC | PRN
  Administered 2021-09-09: 1 mg via INTRAVENOUS

## 2021-09-09 MED ORDER — HEPARIN SOD (PORK) LOCK FLUSH 100 UNIT/ML IV SOLN
INTRAVENOUS | Status: AC
  Filled 2021-09-09: qty 5

## 2021-09-09 MED ORDER — FENTANYL CITRATE (PF) 100 MCG/2ML IJ SOLN
INTRAMUSCULAR | Status: DC | PRN
  Administered 2021-09-09: 50 ug via INTRAVENOUS

## 2021-09-09 MED ORDER — HEPARIN SOD (PORK) LOCK FLUSH 100 UNIT/ML IV SOLN
INTRAVENOUS | Status: AC
  Administered 2021-09-09: 500 [IU]
  Filled 2021-09-09: qty 5

## 2021-09-09 MED ORDER — LIDOCAINE-EPINEPHRINE 1 %-1:100000 IJ SOLN
INTRAMUSCULAR | Status: AC
  Administered 2021-09-09: 15 mL
  Filled 2021-09-09: qty 1

## 2021-09-09 NOTE — H&P (Signed)
Chief Complaint: Patient was seen in consultation today for large B cell lymphoma at the request of [place]  Referring Physician(s): Allen,Lauren G  Supervising Physician: Ruthann Cancer  Patient Status: [place] - Out-pt  History of Present Illness: Stacey Stone is a 76 y.o. female with PMHx significant for left breast cancer s/p lumpectomy, lymph node removal and multiple radiation treatments 2018, history of MALT lymphoma 2016 and now with new lymphadenopathy PET + s/p recent biopsy or right cervical lymph node on 6/12 with our service, pathology with large B cell lymphoma. Patient follows with oncology and was seen in the office on 6/19 with no medical changes since. Request received for IR image guided bone marrow biopsy and port a catheter placement with moderate sedation.   The patient has had a H&P performed within the last 30 days, all history, medications, and exam have been reviewed. The patient denies any interval changes since the H&P.  The patient denies any current chest pain or shortness of breath. The patient denies any fever or chills. The patient does have a history of sleep apnea however does not use her CPAP. She denies any chronic oxygen use. She has no known complications to sedation.    Past Medical History:  Diagnosis Date   Abdominal pain, left lower quadrant    epiploic appendagitis   Anginal pain ([place])    Arthritis 06/06/2018   knee   Asthma    mild   Atrial fibrillation (HCC)    Breast cancer ([place])    Coronary artery disease 06/06/2018   1 artery 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 Mamie Nick. at [facility] in [place]   Depression    Diabetes mellitus without complication ([place])    Dyspnea    Dysrhythmia    GERD (gastroesophageal reflux disease)    Hypertension    Hypothyroidism    MALT (mucosa associated lymphoid tissue) ([place]) 07/05/2014   Right neck mass resected 01/2014.   No pertinent past medical history    Personal history of  radiation therapy    Sleep apnea 06/06/2018   O2- 2l at bedtime and BiPap @ bedtime    Past Surgical History:  Procedure Laterality Date   BREAST BIOPSY Left 2/136/2018   INVASIVE MAMMARY CARCINOMA   BREAST BIOPSY Right 05/25/2016   FIBROCYSTIC CHANGE WITH CALCIFICATIONS    BREAST BIOPSY Right 05/29/2020   Affirm bx-"X" clip-FIBROADENOMA WITH ASSOCIATED CALCIFICATIONS. - NEGATIVE   BREAST EXCISIONAL BIOPSY Left 06/04/2016   INVASIVE MAMMARY CARCINOMA.    BREAST LUMPECTOMY Left 06/04/2016   INVASIVE MAMMARY CARCINOMA.    CARDIAC CATHETERIZATION     CARDIAC CATHETERIZATION N/A 09/24/2015   Procedure: Left Heart Cath and Coronary Angiography;  Surgeon: Isaias Cowman, MD;  Location: [facility];  Service: Cardiovascular;  Laterality: N/A;   CARDIAC CATHETERIZATION N/A 09/24/2015   Procedure: Coronary Stent Intervention;  Surgeon: Isaias Cowman, MD;  Location: [facility];  Service: Cardiovascular;  Laterality: N/A;   CHOLECYSTECTOMY  11/26   08/1970   COLONOSCOPY WITH PROPOFOL N/A 07/08/2021   Procedure: COLONOSCOPY WITH PROPOFOL;  Surgeon: Toledo, Benay Pike, MD;  Location: [facility];  Service: Gastroenterology;  Laterality: N/A;   DILATATION & CURETTAGE/HYSTEROSCOPY WITH MYOSURE N/A 05/05/2015   Procedure: Hysteroscopy and endometrial curretage;  Surgeon: Boykin Nearing, MD;  Location: [facility];  Service: Gynecology;  Laterality: N/A;   DILATION AND CURETTAGE OF UTERUS     EXCISION MASS FROM NECK  Right    Dr. Tami Ribas  EYE SURGERY  05/29/2020   bil cataract 9/08   HAMMER TOE SURGERY Right    JOINT REPLACEMENT Bilateral 2008, 11/26/ 2012   Partial Knee Replacements   LEFT HEART CATH AND CORONARY ANGIOGRAPHY N/A 10/25/2017   Procedure: LEFT HEART CATH AND CORONARY ANGIOGRAPHY;  Surgeon: Teodoro Spray, MD;  Location: [facility];  Service: Cardiovascular;  Laterality: N/A;   LEFT HEART CATH AND CORONARY ANGIOGRAPHY N/A 10/26/2017    Procedure: LEFT HEART CATH AND CORONARY ANGIOGRAPHY;  Surgeon: Isaias Cowman, MD;  Location: [facility];  Service: Cardiovascular;  Laterality: N/A;   PARTIAL MASTECTOMY WITH NEEDLE LOCALIZATION Left 06/04/2016   Procedure: PARTIAL MASTECTOMY WITH NEEDLE LOCALIZATION;  Surgeon: Leonie Green, MD;  Location: [facility];  Service: General;  Laterality: Left;   SCLERAL BUCKLE  02/16/2011   Procedure: SCLERAL BUCKLE;  Surgeon: Hayden Pedro, MD;  Location: [facility];  Service: Ophthalmology;  Laterality: Left;  Scleral Buckle Left Eye with Headscope Laser   SENTINEL NODE BIOPSY Left 06/04/2016   Procedure: SENTINEL NODE BIOPSY;  Surgeon: Leonie Green, MD;  Location: [facility];  Service: General;  Laterality: Left;    Allergies: Codeine and Tramadol  Medications: Prior to Admission medications   Medication Sig Start Date End Date Taking? Authorizing Provider  acetaminophen (TYLENOL) 500 MG tablet Take 500 mg by mouth every 4 (four) hours as needed.   Yes [provider]  apixaban (ELIQUIS) 5 MG TABS tablet Take 5 mg by mouth 2 (two) times daily.   Yes [provider]  aspirin 81 MG EC tablet Take 81 mg by mouth daily.    Yes [provider]  atorvastatin (LIPITOR) 40 MG tablet Take 1 tablet (40 mg total) by mouth at bedtime. 05/29/19  Yes Enzo Bi, MD  famotidine (PEPCID) 40 MG tablet Take 40 mg by mouth at bedtime.   Yes [provider]  ferrous sulfate 325 (65 FE) MG tablet Take 325 mg by mouth once a week.   Yes [provider]  Fluticasone-Salmeterol (ADVAIR) 100-50 MCG/DOSE AEPB Inhale 1 puff into the lungs 2 (two) times daily as needed (for asthma.).   Yes [provider]  isosorbide mononitrate (IMDUR) 30 MG 24 hr tablet Take 30 mg by mouth daily.   Yes [provider]  letrozole (FEMARA) 2.5 MG tablet TAKE 1 TABLET BY MOUTH ONCE DAILY 08/11/20  Yes Lloyd Huger, MD  levothyroxine (SYNTHROID) 125  MCG tablet Take 125 mcg by mouth daily.   Yes [provider]  potassium chloride (KLOR-CON) 10 MEQ tablet Take 1 tablet (10 mEq total) by mouth 2 (two) times daily. 05/29/19  Yes Enzo Bi, MD  RABEprazole (ACIPHEX) 20 MG tablet Take 20 mg by mouth daily.   Yes [provider]  sertraline (ZOLOFT) 50 MG tablet Take 50 mg by mouth at bedtime.   Yes [provider]  sotalol (BETAPACE) 80 MG tablet Take 80 mg by mouth 2 (two) times daily. 09/12/19  Yes [provider]  acidophilus (RISAQUAD) CAPS capsule Take 1 capsule by mouth daily. Patient not taking: Reported on 12/15/2020    [provider]  amLODipine (NORVASC) 5 MG tablet Take 5 mg by mouth daily. 09/12/19   [provider]  amoxicillin (AMOXIL) 875 MG tablet Take 875 mg by mouth 2 (two) times daily. 07/22/21   [provider]  chlorpheniramine-HYDROcodone 10-8 MG/5ML Take 5 mLs by mouth at bedtime as needed for cough. 09/08/21  Verlon Au, NP  diltiazem (CARDIZEM) 30 MG tablet Take 30 mg by mouth 2 (two) times daily as needed (for breakthrough afib).  Patient not taking: Reported on 07/08/2021    [provider]  metoprolol (TOPROL-XL) 100 MG 24 hr tablet Take 50 mg by mouth 2 (two) times daily. Patient not taking: Reported on 12/15/2020    [provider]  nitroGLYCERIN (NITROSTAT) 0.4 MG SL tablet Place 0.4 mg under the tongue every 5 (five) minutes as needed for chest pain. Maximum 3 doses, If no relief call md or 911.    [provider]  oxybutynin (DITROPAN) 5 MG tablet Take 5 mg by mouth at bedtime.     [provider]  torsemide (DEMADEX) 20 MG tablet Take 10 mg by mouth as directed. Take on Tuesday and Friday    [provider]     Family History  Problem Relation Age of Onset   Breast cancer Mother 38   Heart disease Father    Breast cancer Paternal Aunt 8   Breast cancer Cousin    Anesthesia problems Neg Hx    Hypotension  Neg Hx    Malignant hyperthermia Neg Hx    Pseudochol deficiency Neg Hx     Social History   Socioeconomic History   Marital status: Widowed    Spouse name: Not on file   Number of children: Not on file   Years of education: Not on file   Highest education level: Not on file  Occupational History   Not on file  Tobacco Use   Smoking status: Never   Smokeless tobacco: Never  Vaping Use   Vaping Use: Never used  Substance and Sexual Activity   Alcohol use: No   Drug use: No   Sexual activity: Not Currently  Other Topics Concern   Not on file  Social History Narrative   Not on file   Social Determinants of Health   Financial Resource Strain: Not on file  Food Insecurity: Not on file  Transportation Needs: Not on file  Physical Activity: Not on file  Stress: Not on file  Social Connections: Not on file    Review of Systems: A 12 point ROS discussed and pertinent positives are indicated in the HPI above.  All other systems are negative.  Review of Systems  Vital Signs: BP 114/74   Pulse 96   Temp 98 F (36.7 C) (Oral)   Resp 18   Ht _0  (1.575 m)   Wt 173 lb (78.5 kg)   SpO2 98%   BMI 31.64 kg/m   Physical Exam Constitutional:      Appearance: Normal appearance.  Cardiovascular:     Rate and Rhythm: Normal rate and regular rhythm.  Pulmonary:     Effort: Pulmonary effort is normal. No respiratory distress.     Breath sounds: Normal breath sounds.  Lymphadenopathy:     Cervical: Cervical adenopathy present.  Skin:    General: Skin is warm and dry.  Neurological:     Mental Status: She is alert and oriented to person, place, and time.    Imaging: DG Chest 2 View  Result Date: 09/07/2021 CLINICAL DATA:  Cough.  Shortness of breath. EXAM: CHEST - 2 VIEW COMPARISON:  AP chest 05/24/2019 FINDINGS: Cardiac silhouette is unchanged and within normal limits. There is again a tortuous thoracic aorta. Mild calcification within the aortic arch. The lungs are  clear. No pleural effusion or pneumothorax. Mild multilevel degenerative disc  changes of the thoracic spine. Mild levocurvature of the mid to upper thoracic spine. IMPRESSION: No active cardiopulmonary disease. Electronically Signed   By: Yvonne Kendall M.D.   On: 09/07/2021 11:48   [place] CORE BIOPSY (LYMPH NODES)  Result Date: 08/31/2021 INDICATION: History of lymphoma.  Hypermetabolic adenopathy on PET-CT EXAM: ULTRASOUND-GUIDED RIGHT CERVICAL NODAL MASS BIOPSY COMPARISON:  PET-CT, 08/19/2021.  CT neck, 08/11/2021 MEDICATIONS: None ANESTHESIA/SEDATION: Local anesthetic was administered. COMPLICATIONS: None immediate. TECHNIQUE: Informed written consent was obtained from the patient and/or patient's representative after a discussion of the risks, benefits and alternatives to treatment. Questions regarding the procedure were encouraged and answered. Initial ultrasound scanning demonstrated hypervascular RIGHT cervical nodal mass. An ultrasound image was saved for documentation purposes. The procedure was planned. A timeout was performed prior to the initiation of the procedure. The operative was prepped and draped in the usual sterile fashion, and a sterile drape was applied covering the operative field. A timeout was performed prior to the initiation of the procedure. Local anesthesia was provided with 1% lidocaine with epinephrine. Under direct ultrasound guidance, an 18 gauge core needle device was utilized to obtain to obtain 3 core needle biopsies of the RIGHT cervical nodal mass. The samples were placed in saline and submitted to pathology. The needle was removed and hemostasis was achieved with manual compression. Post procedure scan was negative for significant hematoma. A dressing was placed. The patient tolerated the procedure well without immediate postprocedural complication. IMPRESSION: Successful ultrasound guided biopsy of the RIGHT cervical nodal mass, as above. Michaelle Birks, MD [facility] Radiology Electronically Signed   By: Michaelle Birks M.D.   On: 08/31/2021 14:55   NM PET Image Initial (PI) Skull Base To Thigh  Result Date: 08/19/2021 CLINICAL DATA:  Subsequent treatment strategy for lymphoma in a 76 year old female also with history of breast cancer in 2018. EXAM: NUCLEAR MEDICINE PET SKULL BASE TO THIGH TECHNIQUE: 9.55 mCi F-18 FDG was injected intravenously. Full-ring PET imaging was performed from the skull base to thigh after the radiotracer. CT data was obtained and used for attenuation correction and anatomic localization. Fasting blood glucose: 86 mg/dl COMPARISON:  CT of the neck 08/11/2021 and multiple prior CT and PET evaluations. Exams. FINDINGS: Mediastinal blood pool activity: SUV max 2.44 Liver activity: SUV max 4.0 NECK: Nodular area in the RIGHT maxillary sinus (image 28/2) is associated with anterior wall of the RIGHT maxillary sinus measuring 17 x 9 mm with a maximum SUV of 23.4. Adenopathy throughout the RIGHT neck with bulky appearance not substantially changed compared to recent CT imaging and greatest on the RIGHT at level III (image 52/2) 3.9 cm greatest axial dimension with a maximum SUV of 22.3 Lymph nodes track into the RIGHT thoracic inlet from the RIGHT neck all with marked metabolic activity. (Image 41/2) level II/lymph node 17 mm with an SUV of 29.8. Contralateral lymph nodes with very mild increased metabolic activity (image 24/4 LEFT level II lymph node is with a maximum SUV of 5.1. Bilateral supraclavicular lymph nodes (image 64/2 measuring 17 and 18 mm on the RIGHT and LEFT respectively, maximum SUV 12.75 on the RIGHT and 13.65 on the LEFT. Incidental CT findings: none CHEST: RIGHT paratracheal, at LEFT paratracheal, AP window and RIGHT hilar adenopathy with increased metabolic activity showing marked FDG uptake. Subcarinal adenopathy (image 89/2) maximum SUV of 13.6 measuring 23 mm. RIGHT paratracheal lymph  nodes tracking from the thoracic inlet along the RIGHT paratracheal chain largest approximately 15  mm with similar marked increased metabolic activity. RIGHT hilar activity with a maximum SUV of 15.8 (image 95/2. Discrete lymph node difficult to measure in this area. RIGHT axillary lymph nodes with less pronounced metabolic activity tracking from RIGHT thoracic inlet beneath subpectoral lymph nodes on the RIGHT into the RIGHT axilla, dominant lymph node is a RIGHT subpectoral node with a maximum SUV of 4.7 (image 74/2 measuring approximately 10 mm. Retrocrural lymph nodes also with mild enlargement and moderate to marked increased metabolic activity contiguous with celiac and retroperitoneal nodal disease. No suspicious pulmonary nodules. Incidental CT findings: Aortic atherosclerosis. Moderate cardiomegaly. No pericardial effusion or nodularity. Central pulmonary vasculature unremarkable on noncontrast imaging. Basilar atelectasis. Airways are patent. ABDOMEN/PELVIS: Adenopathy tracks throughout the abdomen in the retroperitoneum and also involving celiac and periportal lymph nodes (image 134/2) diffuse increased metabolic activity associated with nodal disease tracking into the porta hepatis and along celiac branches open parent image 136/2) dominant lymph node 19 mm short axis with a maximum SUV of 16.9, similar increased metabolic activity associated with attic gastric and other celiac nodes seen on the current exam. Bulky LEFT periaortic adenopathy (image 159/2) 19 mm short axis with a maximum SUV of 14.81. Intra-aortocaval adenopathy with with a maximum SUV of 30.3 (image 149/2) this measures 15 mm. No signs of adenopathy in the pelvis. Focal area of uptake in the cephalad spleen (image 121/2) maximum SUV of 5.3 without corresponding visible CT lesion though with mild lobulation of splenic contour in this location measuring approximately 13 mm. Heterogeneity in general of the spleen without signs of  splenomegaly. Incidental CT findings: No acute findings relative to liver, pancreas, spleen, adrenal glands, kidneys, stomach, small or large bowel. There is colonic diverticulosis and there is mild aortic atherosclerosis. SKELETON: No focal hypermetabolic activity to suggest skeletal metastasis. Incidental CT findings: none IMPRESSION: Extensive adenopathy involving the neck, chest and abdomen with SUV values as high as 30.3, suspicious for lymphoma recurrence or metastatic process. Most accessible lymph nodes are present in the neck. Splenic uptake with focal characteristics is more suggestive of lymphoma. RIGHT maxillary sinus nodularity with marked increased metabolic activity along the anterior wall of the RIGHT maxillary sinus, while atypical is suspicious for additional site of involvement. Aortic Atherosclerosis (ICD10-I70.0). Electronically Signed   By: Zetta Bills M.D.   On: 08/19/2021 13:40   CT SOFT TISSUE NECK W CONTRAST  Result Date: 08/12/2021 CLINICAL DATA:  Anterior cervical lymphadenopathy with soreness for 3 weeks. History of non-Hodgkin's lymphoma and breast cancer. EXAM: CT NECK WITH CONTRAST TECHNIQUE: Multidetector CT imaging of the neck was performed using the standard protocol following the bolus administration of intravenous contrast. RADIATION DOSE REDUCTION: This exam was performed according to the departmental dose-optimization program which includes automated exposure control, adjustment of the mA and/or kV according to patient size and/or use of iterative reconstruction technique. CONTRAST:  52m ISOVUE-300 IOPAMIDOL (ISOVUE-300) INJECTION 61% COMPARISON:  04/28/2018 chest CT and 10/03/2013 neck CT FINDINGS: Pharynx and larynx: No thickening of Waldeyer's ring. No evidence of mucosal mass or inflammation Salivary glands: Right submandibular gland atrophy or prior resection. No acute inflammation or mass Thyroid: Symmetric atrophy. Lymph nodes: Diffuse right jugular chain  lymphadenopathy with large lobulated nodes which show homogeneous enhancement despite a right mid chain node measuring up to 3.8 cm in length. Lower left jugular chain adenopathy at the supraclavicular fossa measuring up to 2.8 cm. There is continuation of enlarged nodes in the mediastinum. Partial coverage of the deep right axilla shows  adenopathy with node measuring up to 19 mm in length. Vascular: No acute finding Limited intracranial: Negative. Visualized orbits: Essentially not covered. Postoperative appearance of the left globe. Mastoids and visualized paranasal sinuses: Partial coverage shows asymmetric rightward opacification of sinuses, confluent at the visible right posterior ethmoid and sphenoid sinuses. Lobulated density in the right maxillary sinus, usually retention cyst. Skeleton: Cervical spine degeneration. No acute or aggressive finding. Upper chest: Negative These results will be called to the ordering clinician or representative by the Radiologist Assistant, and communication documented in the PACS or [employer]. IMPRESSION: Widespread lymphadenopathy involving the right more than left neck, mediastinum, and right axilla. Recurrent lymphoma or metastatic disease are both possible, favor lymphoma. Sinus opacification asymmetric to the right. Electronically Signed   By: Jorje Guild M.D.   On: 08/12/2021 11:14    Labs:  CBC: Recent Labs    12/15/20 1006 09/07/21 1251 09/09/21 0746  WBC 10.4 8.7 9.2  HGB 13.4 12.3 11.8*  HCT 40.7 38.9 38.3  PLT 274 278 334    COAGS: No results for input(s): "INR", "APTT" in the last 8760 hours.  BMP: Recent Labs    12/15/20 1006 09/07/21 1251  NA 137 135  K 3.6 4.5  CL 102 98  CO2 28 25  GLUCOSE 104* 105*  BUN 14 10  CALCIUM 9.4 9.5  CREATININE 0.87 0.85  GFRNONAA >60 >60    LIVER FUNCTION TESTS: Recent Labs    12/15/20 1006 09/07/21 1251  BILITOT 0.8 0.6  AST 41 46*  ALT 36 36  ALKPHOS 202* 190*  PROT 7.5 7.5   ALBUMIN 3.6 3.1*    Assessment and Plan: This is a 76 year old female with PMHx significant for left breast cancer s/p lumpectomy, lymph node removal and multiple radiation treatments 2018, history of MALT lymphoma 2016 and now with new lymphadenopathy PET + s/p recent biopsy or right cervical lymph node on 6/12 with our service, pathology with large B cell lymphoma. Patient follows with oncology and was seen in the office on 6/19 with no medical changes since. Request received for IR image guided bone marrow biopsy and port a catheter placement with moderate sedation.   The patient has been NPO, on eliquis and asa- last dose yesterday morning, imaging, labs and vitals have been reviewed.  Risks and benefits of image guided port-a-catheter placement was discussed with the patient including, but not limited to bleeding, infection, pneumothorax, or fibrin sheath development and need for additional procedures.  All of the patient's questions were answered, patient is agreeable to proceed. Consent signed and in chart.  Risks and benefits of bone marrow biopsy with moderate sedation was discussed with the patient and/or patient's family including, but not limited to bleeding, infection, damage to adjacent structures or low yield requiring additional tests.  All of the questions were answered and there is agreement to proceed.  Consent signed and in chart.   Thank you for this interesting consult.  I greatly enjoyed meeting Stacey Stone and look forward to participating in their care.  A copy of this report was sent to the requesting provider on this date.  Electronically Signed: Hedy Jacob, PA-C 09/09/2021, 8:51 AM   I spent a total of 15 Minutes in face to face in clinical consultation, greater than 50% of which was counseling/coordinating care for large B cell lymphoma.

## 2021-09-09 NOTE — Progress Notes (Signed)
Patient clinically stable post port/BMB procedures, tolerated well. Vitals stable pre and post procedures. Awake/alert and oriented post procedures received Versed 2 mg along with Fentanyl 100 mcg IV for both procedures. Report given to [facility] post procedures/specials.

## 2021-09-09 NOTE — Procedures (Signed)
Interventional Radiology Procedure Note  Procedure:   CT guided aspirate and core biopsy of right iliac bone  Single Lumen Power Port Placement    Access:  Single Lumen Power Port Placement    Complications: None  EBL: < 10 mL  Recommendations: - Bedrest supine x 1 hrs - Ok to shower in 24 hours - Do not submerge for 7 days - Routine line care  - Follow biopsy results   Ruthann Cancer, MD

## 2021-09-10 ENCOUNTER — Encounter: Payer: Self-pay | Admitting: Oncology

## 2021-09-10 ENCOUNTER — Inpatient Hospital Stay: Payer: BC Managed Care – PPO | Admitting: Oncology

## 2021-09-10 ENCOUNTER — Other Ambulatory Visit: Payer: BC Managed Care – PPO

## 2021-09-10 VITALS — BP 130/79 | HR 61 | Temp 98.8°F | Ht 62.0 in | Wt 178.1 lb

## 2021-09-10 DIAGNOSIS — C8330 Diffuse large B-cell lymphoma, unspecified site: Secondary | ICD-10-CM | POA: Insufficient documentation

## 2021-09-10 DIAGNOSIS — C8338 Diffuse large B-cell lymphoma, lymph nodes of multiple sites: Secondary | ICD-10-CM

## 2021-09-10 DIAGNOSIS — Z5111 Encounter for antineoplastic chemotherapy: Secondary | ICD-10-CM | POA: Diagnosis not present

## 2021-09-10 NOTE — Progress Notes (Unsigned)
Biopsy results.   More sob this past week.

## 2021-09-10 NOTE — Progress Notes (Signed)

## 2021-09-11 ENCOUNTER — Encounter: Payer: Self-pay | Admitting: Oncology

## 2021-09-11 LAB — SURGICAL PATHOLOGY

## 2021-09-11 MED ORDER — PREDNISONE 20 MG PO TABS: 100.0000 mg | ORAL_TABLET | Freq: Every day | ORAL | 5 refills | Status: DC

## 2021-09-11 MED ORDER — ALLOPURINOL 300 MG PO TABS: 300.0000 mg | ORAL_TABLET | Freq: Every day | ORAL | 3 refills | Status: DC

## 2021-09-11 MED ORDER — LIDOCAINE-PRILOCAINE 2.5-2.5 % EX CREA: TOPICAL_CREAM | CUTANEOUS | 3 refills | Status: DC

## 2021-09-11 MED ORDER — PROCHLORPERAZINE MALEATE 10 MG PO TABS: 10.0000 mg | ORAL_TABLET | Freq: Four times a day (QID) | ORAL | 6 refills | Status: DC | PRN

## 2021-09-11 MED ORDER — ONDANSETRON HCL 8 MG PO TABS: 8.0000 mg | ORAL_TABLET | Freq: Two times a day (BID) | ORAL | 1 refills | Status: DC | PRN

## 2021-09-11 NOTE — Progress Notes (Signed)
[place]  [phone]  Fax:([phone]) [place] DOB: 1946-02-19  MR#: [id number]  PTW#:[id number]  Patient Care Team: Rusty Aus, MD as PCP - General (Internal Medicine) Isaias Cowman, MD as Consulting Physician (Cardiology)  CHIEF COMPLAINT: Stage III diffuse large B-cell lymphoma.  INTERVAL HISTORY: Patient returns to clinic today for further evaluation and initiation of cycle 1 of R-CHOP.  She continues to have increased weakness and fatigue and cough.  She does not complain of any fevers, night sweats, or weight loss.  She has no neurologic complaints.  She denies any chest pain, shortness of breath, or hemoptysis.  She denies any nausea, vomiting, constipation, or diarrhea. She has no urinary complaints.  Patient offers no further specific complaints today.  REVIEW OF SYSTEMS:   Review of Systems  Constitutional:  Positive for malaise/fatigue. Negative for chills, diaphoresis, fever and weight loss.  Respiratory:  Positive for cough. Negative for shortness of breath.   Cardiovascular: Negative.  Negative for chest pain and leg swelling.  Gastrointestinal: Negative.  Negative for abdominal pain.  Genitourinary: Negative.  Negative for dysuria.  Musculoskeletal: Negative.  Negative for back pain.  Skin: Negative.  Negative for rash.  Neurological:  Positive for weakness. Negative for sensory change, focal weakness and headaches.  Psychiatric/Behavioral: Negative.  The patient is not nervous/anxious.     As per HPI. Otherwise, a complete review of systems is negative.  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 MammaPrint was reported as low risk, therefore she did not require adjuvant chemotherapy. She  completed adjuvant XRT. Will complete 5 year of letrozole in July 2023.   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  Patient's most recent CT scan on April 28, 2018 revealed no evidence of progressive or recurrent disease.    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[place])  07/05/2014 Initial Diagnosis   MALT (mucosa associated lymphoid tissue)   Primary cancer of upper outer quadrant of left female breast ([place])  05/09/2016 Initial Diagnosis   Primary cancer of upper outer quadrant of left female breast ([place])   DLBCL (diffuse large B cell lymphoma) ([place])  09/10/2021 Initial Diagnosis   DLBCL (diffuse large B cell lymphoma) ([place])   09/17/2021 -  Chemotherapy   Patient is on Treatment Plan : NON-HODGKINS LYMPHOMA R-CHOP q21d       PAST MEDICAL HISTORY: Past Medical History:  Diagnosis Date   Abdominal pain, left lower quadrant    epiploic appendagitis   Anginal pain ([place])    Arthritis 06/06/2018   knee   Asthma    mild   Atrial fibrillation ([facility])    Breast cancer ([place])    Coronary artery disease 06/06/2018   1 artery 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 Mamie Nick. at [facility] in [place]   Depression    Diabetes mellitus without complication ([place])    Dyspnea    Dysrhythmia    GERD (gastroesophageal reflux disease)    Hypertension    Hypothyroidism    MALT (mucosa associated lymphoid tissue) ([place]) 07/05/2014   Right neck mass resected 01/2014.   No pertinent past medical history    Personal history of radiation therapy    Sleep apnea 06/06/2018   O2- 2l at bedtime and BiPap @ bedtime  PAST SURGICAL HISTORY: Past Surgical History:  Procedure Laterality Date   BREAST BIOPSY Left 2/136/2018   INVASIVE MAMMARY CARCINOMA   BREAST BIOPSY Right 05/25/2016   FIBROCYSTIC CHANGE WITH CALCIFICATIONS    BREAST BIOPSY Right 05/29/2020   Affirm bx-"X" clip-FIBROADENOMA WITH ASSOCIATED CALCIFICATIONS. - NEGATIVE   BREAST EXCISIONAL BIOPSY Left  06/04/2016   INVASIVE MAMMARY CARCINOMA.    BREAST LUMPECTOMY Left 06/04/2016   INVASIVE MAMMARY CARCINOMA.    CARDIAC CATHETERIZATION     CARDIAC CATHETERIZATION N/A 09/24/2015   Procedure: Left Heart Cath and Coronary Angiography;  Surgeon: Isaias Cowman, MD;  Location: [facility];  Service: Cardiovascular;  Laterality: N/A;   CARDIAC CATHETERIZATION N/A 09/24/2015   Procedure: Coronary Stent Intervention;  Surgeon: Isaias Cowman, MD;  Location: [facility];  Service: Cardiovascular;  Laterality: N/A;   CHOLECYSTECTOMY  11/26   08/1970   COLONOSCOPY WITH PROPOFOL N/A 07/08/2021   Procedure: COLONOSCOPY WITH PROPOFOL;  Surgeon: Toledo, Benay Pike, MD;  Location: [facility];  Service: Gastroenterology;  Laterality: N/A;   DILATATION & CURETTAGE/HYSTEROSCOPY WITH MYOSURE N/A 05/05/2015   Procedure: Hysteroscopy and endometrial curretage;  Surgeon: Boykin Nearing, MD;  Location: [facility];  Service: Gynecology;  Laterality: N/A;   DILATION AND CURETTAGE OF UTERUS     EXCISION MASS FROM NECK  Right    Dr. Tami Ribas   EYE SURGERY  05/29/2020   bil cataract 9/08   HAMMER TOE SURGERY Right    IR BONE MARROW BIOPSY & ASPIRATION  09/09/2021   IR IMAGING GUIDED PORT INSERTION  09/09/2021   JOINT REPLACEMENT Bilateral 2008, 11/26/ 2012   Partial Knee Replacements   LEFT HEART CATH AND CORONARY ANGIOGRAPHY N/A 10/25/2017   Procedure: LEFT HEART CATH AND CORONARY ANGIOGRAPHY;  Surgeon: Teodoro Spray, MD;  Location: [facility];  Service: Cardiovascular;  Laterality: N/A;   LEFT HEART CATH AND CORONARY ANGIOGRAPHY N/A 10/26/2017   Procedure: LEFT HEART CATH AND CORONARY ANGIOGRAPHY;  Surgeon: Isaias Cowman, MD;  Location: [facility];  Service: Cardiovascular;  Laterality: N/A;   PARTIAL MASTECTOMY WITH NEEDLE LOCALIZATION Left 06/04/2016   Procedure: PARTIAL MASTECTOMY WITH NEEDLE LOCALIZATION;  Surgeon: Leonie Green, MD;   Location: [facility];  Service: General;  Laterality: Left;   SCLERAL BUCKLE  02/16/2011   Procedure: SCLERAL BUCKLE;  Surgeon: Hayden Pedro, MD;  Location: [facility];  Service: Ophthalmology;  Laterality: Left;  Scleral Buckle Left Eye with Headscope Laser   SENTINEL NODE BIOPSY Left 06/04/2016   Procedure: SENTINEL NODE BIOPSY;  Surgeon: Leonie Green, MD;  Location: [facility];  Service: General;  Laterality: Left;    FAMILY HISTORY Family History  Problem Relation Age of Onset   Breast cancer Mother 52   Heart disease Father    Breast cancer Paternal Aunt 39   Breast cancer Cousin    Anesthesia problems Neg Hx    Hypotension Neg Hx    Malignant hyperthermia Neg Hx    Pseudochol deficiency Neg Hx     GYNECOLOGIC HISTORY:  No LMP recorded. Patient is postmenopausal.     ADVANCED DIRECTIVES:    HEALTH MAINTENANCE: Social History   Tobacco Use   Smoking status: Never   Smokeless tobacco: Never  Vaping Use   Vaping Use: Never used  Substance Use Topics   Alcohol use: No   Drug use: No     Colonoscopy:  PAP:  Bone density:  Mammogram: November 2016  Allergies  Allergen  Reactions   Codeine Other (See Comments)    Stroke like symptoms   Tramadol Itching    Current Outpatient Medications  Medication Sig Dispense Refill   acetaminophen (TYLENOL) 500 MG tablet Take 500 mg by mouth every 4 (four) hours as needed.     allopurinol (ZYLOPRIM) 300 MG tablet Take 1 tablet (300 mg total) by mouth daily. 30 tablet 3   amLODipine (NORVASC) 5 MG tablet Take 5 mg by mouth daily.     amoxicillin (AMOXIL) 875 MG tablet Take 875 mg by mouth 2 (two) times daily.     apixaban (ELIQUIS) 5 MG TABS tablet Take 5 mg by mouth 2 (two) times daily.     aspirin 81 MG EC tablet Take 81 mg by mouth daily.      atorvastatin (LIPITOR) 40 MG tablet Take 1 tablet (40 mg total) by mouth at bedtime.     chlorpheniramine-HYDROcodone 10-8 MG/5ML Take 5 mLs by mouth at bedtime as needed for cough.  140 mL 0   famotidine (PEPCID) 40 MG tablet Take 40 mg by mouth at bedtime.     ferrous sulfate 325 (65 FE) MG tablet Take 325 mg by mouth once a week.     Fluticasone-Salmeterol (ADVAIR) 100-50 MCG/DOSE AEPB Inhale 1 puff into the lungs 2 (two) times daily as needed (for asthma.).     isosorbide mononitrate (IMDUR) 30 MG 24 hr tablet Take 30 mg by mouth daily.     letrozole (FEMARA) 2.5 MG tablet TAKE 1 TABLET BY MOUTH ONCE DAILY 90 tablet 3   levothyroxine (SYNTHROID) 125 MCG tablet Take 125 mcg by mouth daily.     lidocaine-prilocaine (EMLA) cream Apply to affected area once 30 g 3   nitroGLYCERIN (NITROSTAT) 0.4 MG SL tablet Place 0.4 mg under the tongue every 5 (five) minutes as needed for chest pain. Maximum 3 doses, If no relief call md or 911.     ondansetron (ZOFRAN) 8 MG tablet Take 1 tablet (8 mg total) by mouth 2 (two) times daily as needed for refractory nausea / vomiting. Start on day 3 after cyclophosphamide chemotherapy. 60 tablet 1   potassium chloride (KLOR-CON) 10 MEQ tablet Take 1 tablet (10 mEq total) by mouth 2 (two) times daily.     predniSONE (DELTASONE) 20 MG tablet Take 5 tablets (100 mg total) by mouth daily. Take with food on days 1-5 of chemotherapy. 25 tablet 5   prochlorperazine (COMPAZINE) 10 MG tablet Take 1 tablet (10 mg total) by mouth every 6 (six) hours as needed (Nausea or vomiting). 30 tablet 6   RABEprazole (ACIPHEX) 20 MG tablet Take 20 mg by mouth daily.     sertraline (ZOLOFT) 50 MG tablet Take 50 mg by mouth at bedtime.     sotalol (BETAPACE) 80 MG tablet Take 80 mg by mouth 2 (two) times daily.     torsemide (DEMADEX) 20 MG tablet Take 10 mg by mouth as directed. Take on Tuesday and Friday     acidophilus (RISAQUAD) CAPS capsule Take 1 capsule by mouth daily. (Patient not taking: Reported on 12/15/2020)     diltiazem (CARDIZEM) 30 MG tablet Take 30 mg by mouth 2 (two) times daily as needed (for breakthrough afib).  (Patient not taking: Reported on  07/08/2021)     metoprolol (TOPROL-XL) 100 MG 24 hr tablet Take 50 mg by mouth 2 (two) times daily. (Patient not taking: Reported on 12/15/2020)     oxybutynin (DITROPAN) 5 MG tablet Take 5 mg by  mouth at bedtime.  (Patient not taking: Reported on 09/10/2021)     No current facility-administered medications for this visit.    OBJECTIVE: BP 122/78   Pulse 80   Temp 98.1 F (36.7 C) (Tympanic)   Resp 18   Wt 176 lb 1.6 oz (79.9 kg)   SpO2 96%   BMI 32.21 kg/m    Body mass index is 32.21 kg/m.    ECOG FS:0 - Asymptomatic  General: Well-developed, well-nourished, no acute distress. Eyes: Pink conjunctiva, anicteric sclera. HEENT: Normocephalic, moist mucous membranes. Lungs: No audible wheezing or coughing. Heart: Regular rate and rhythm. Abdomen: Soft, nontender, no obvious distention. Musculoskeletal: No edema, cyanosis, or clubbing. Neuro: Alert, answering all questions appropriately. Cranial nerves grossly intact. Skin: No rashes or petechiae noted. Psych: Normal affect. Lymphatics: Easily palpable cervical and supraclavicular lymphadenopathy.  LAB RESULTS:   STUDIES: NM Cardiac M[facility] Rest  Result Date: 09/15/2021 CLINICAL DATA:  Diffuse large B-cell lymphoma, cardiotoxic chemotherapy planned, history of coronary stenting, atrial fibrillation, COPD, prior chemotherapy for breast cancer in 2018 EXAM: NUCLEAR MEDICINE CARDIAC BLOOD POOL IMAGING (MUGA) TECHNIQUE: Cardiac multi-gated acquisition was performed at rest following intravenous injection of Tc-52mlabeled red blood cells. RADIOPHARMACEUTICALS:  22.76 mCi [id number] in-vitro labeled red blood cells IV COMPARISON:  None Available. FINDINGS: Calculated LEFT ventricular ejection fraction is 75%, upper normal. Study was obtained at a cardiac rate of 67 bpm. Patient was rhythmic during imaging. Cine analysis of the LEFT ventricle in 3 projections demonstrates normal LV wall motion. IMPRESSION: Upper normal LEFT ventricular  ejection fraction of 75% with normal LV wall motion. Electronically Signed   By: MaLavonia Dana.D.   On: 09/15/2021 15:34    ASSESSMENT: Stage III diffuse large B-cell lymphoma.  PLAN:    Stage III diffuse large B-cell lymphoma: PET scan and biopsy results reviewed independently.  Case discussed with pathology confirming this is not a recurrence of patient's MALT lymphoma.  Bone marrow biopsy did not reveal any evidence of lymphoma.  MUGA scan completed on September 15, 2021 revealed an EF of 75%.  Repeat in September 2023 if necessary.  Patient will benefit from 6 cycles of R-CHOP every 21 days with Udenyca support.  Will reimage with PET scan after cycle 4.  Proceed with cycle 1 of treatment today.  Return to clinic tomorrow for UdEllen Henrion September 29, 2021 for laboratory work and further evaluation, and then in 3 weeks for further evaluation and consideration of cycle 2. 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 MammaPrint was reported as low risk, therefore she did not require adjuvant chemotherapy. She completed adjuvant XRT.  Patient completed 5 years of letrozole in June 2023.  Her most recent mammogram on May 20, 2021 was reported as BI-RADS 1.  Repeat in March 2024.   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  Case discussed with pathology.  This is a different primary.   Left renal lesion: CT scan results from December 15, 2020 with only mild increase of benign renal lesion to 13 mm.  No intervention is needed at this time. Bone mineral density: Patient's most recent bone mineral density on November 13, 2020 revealed a T score of -2.0 which is slightly worse than 1 year prior where her T score was reported -1.7.  Continue calcium and vitamin D supplementation.  Consider  repeat in August 2023.   Anemia: Mild, monitor.  Patient's hemoglobin is 11.0.  Patient expressed understanding and was in agreement with this plan. She also understands that She can call clinic at any time with any questions, concerns, or complaints.     Lloyd Huger, MD   09/18/2021 7:10 AM

## 2021-09-15 ENCOUNTER — Inpatient Hospital Stay: Payer: BC Managed Care – PPO

## 2021-09-15 ENCOUNTER — Ambulatory Visit
Admission: RE | Admit: 2021-09-15 | Discharge: 2021-09-15 | Disposition: A | Discharge status: Home / Self Care | Payer: BC Managed Care – PPO | Source: Ambulatory Visit | Attending: Oncology | Admitting: Oncology

## 2021-09-15 DIAGNOSIS — C8338 Diffuse large B-cell lymphoma, lymph nodes of multiple sites: Secondary | ICD-10-CM | POA: Insufficient documentation

## 2021-09-15 MED ORDER — TECHNETIUM TC 99M-LABELED RED BLOOD CELLS IV KIT
20.0000 | PACK | Freq: Once | INTRAVENOUS | Status: AC | PRN
  Administered 2021-09-15: 22.7 via INTRAVENOUS

## 2021-09-17 ENCOUNTER — Encounter (HOSPITAL_COMMUNITY): Payer: Self-pay | Admitting: Oncology

## 2021-09-17 ENCOUNTER — Inpatient Hospital Stay: Payer: BC Managed Care – PPO

## 2021-09-17 ENCOUNTER — Inpatient Hospital Stay (HOSPITAL_BASED_OUTPATIENT_CLINIC_OR_DEPARTMENT_OTHER): Payer: BC Managed Care – PPO | Admitting: Oncology

## 2021-09-17 ENCOUNTER — Encounter: Payer: Self-pay | Admitting: Oncology

## 2021-09-17 VITALS — BP 106/61 | HR 63 | Temp 96.9°F | Resp 16

## 2021-09-17 VITALS — BP 122/78 | HR 80 | Temp 98.1°F | Resp 18 | Wt 176.1 lb

## 2021-09-17 DIAGNOSIS — C8338 Diffuse large B-cell lymphoma, lymph nodes of multiple sites: Secondary | ICD-10-CM

## 2021-09-17 DIAGNOSIS — Z5111 Encounter for antineoplastic chemotherapy: Secondary | ICD-10-CM | POA: Diagnosis not present

## 2021-09-17 LAB — COMPREHENSIVE METABOLIC PANEL
ALT: 58 U/L — ABNORMAL HIGH (ref 0–44)
AST: 71 U/L — ABNORMAL HIGH (ref 15–41)
Albumin: 2.8 g/dL — ABNORMAL LOW (ref 3.5–5.0)
Alkaline Phosphatase: 329 U/L — ABNORMAL HIGH (ref 38–126)
Anion gap: 11 (ref 5–15)
BUN: 12 mg/dL (ref 8–23)
CO2: 24 mmol/L (ref 22–32)
Calcium: 9.1 mg/dL (ref 8.9–10.3)
Chloride: 95 mmol/L — ABNORMAL LOW (ref 98–111)
Creatinine, Ser: 0.95 mg/dL (ref 0.44–1.00)
GFR, Estimated: >60 mL/min (ref >60)
Glucose, Bld: 130 mg/dL — ABNORMAL HIGH (ref 70–99)
Potassium: 4.2 mmol/L (ref 3.5–5.1)
Sodium: 130 mmol/L — ABNORMAL LOW (ref 135–145)
Total Bilirubin: 1.2 mg/dL (ref 0.3–1.2)
Total Protein: 7.2 g/dL (ref 6.5–8.1)

## 2021-09-17 LAB — CBC WITH DIFFERENTIAL/PLATELET
Abs Immature Granulocytes: 0.11 10*3/uL — ABNORMAL HIGH (ref 0.00–0.07)
Basophils Absolute: 0 10*3/uL (ref 0.0–0.1)
Basophils Relative: 0 %
Eosinophils Absolute: 0.1 10*3/uL (ref 0.0–0.5)
Eosinophils Relative: 1 %
HCT: 34.6 % — ABNORMAL LOW (ref 36.0–46.0)
Hemoglobin: 11 g/dL — ABNORMAL LOW (ref 12.0–15.0)
Immature Granulocytes: 1 %
Lymphocytes Relative: 8 %
Lymphs Abs: 1.1 10*3/uL (ref 0.7–4.0)
MCH: 24.5 pg — ABNORMAL LOW (ref 26.0–34.0)
MCHC: 31.8 g/dL (ref 30.0–36.0)
MCV: 77.1 fL — ABNORMAL LOW (ref 80.0–100.0)
Monocytes Absolute: 1.3 10*3/uL — ABNORMAL HIGH (ref 0.1–1.0)
Monocytes Relative: 9 %
Neutro Abs: 11.7 10*3/uL — ABNORMAL HIGH (ref 1.7–7.7)
Neutrophils Relative %: 81 %
Platelets: 360 10*3/uL (ref 150–400)
RBC: 4.49 MIL/uL (ref 3.87–5.11)
RDW: 15.4 % (ref 11.5–15.5)
WBC: 14.3 10*3/uL — ABNORMAL HIGH (ref 4.0–10.5)
nRBC: 0 % (ref 0.0–0.2)

## 2021-09-17 LAB — HEPATITIS B CORE ANTIBODY, TOTAL: Hep B Core Total Ab: NONREACTIVE

## 2021-09-17 LAB — HEPATITIS B SURFACE ANTIGEN: Hepatitis B Surface Ag: NONREACTIVE

## 2021-09-17 MED ORDER — DOXORUBICIN HCL CHEMO IV INJECTION 2 MG/ML
50.0000 mg/m2 | Freq: Once | INTRAVENOUS | Status: AC
  Administered 2021-09-17: 94 mg via INTRAVENOUS
  Filled 2021-09-17: qty 47

## 2021-09-17 MED ORDER — PALONOSETRON HCL INJECTION 0.25 MG/5ML
0.2500 mg | Freq: Once | INTRAVENOUS | Status: AC
  Administered 2021-09-17: 0.25 mg via INTRAVENOUS
  Filled 2021-09-17: qty 5

## 2021-09-17 MED ORDER — VINCRISTINE SULFATE CHEMO INJECTION 1 MG/ML
2.0000 mg | Freq: Once | INTRAVENOUS | Status: AC
  Administered 2021-09-17: 2 mg via INTRAVENOUS
  Filled 2021-09-17: qty 2

## 2021-09-17 MED ORDER — SODIUM CHLORIDE 0.9 % IV SOLN
750.0000 mg/m2 | Freq: Once | INTRAVENOUS | Status: AC
  Administered 2021-09-17: 1420 mg via INTRAVENOUS
  Filled 2021-09-17: qty 71

## 2021-09-17 MED ORDER — SODIUM CHLORIDE 0.9 % IV SOLN
375.0000 mg/m2 | Freq: Once | INTRAVENOUS | Status: AC
  Administered 2021-09-17: 700 mg via INTRAVENOUS
  Filled 2021-09-17: qty 20

## 2021-09-17 MED ORDER — SODIUM CHLORIDE 0.9 % IV SOLN
150.0000 mg | Freq: Once | INTRAVENOUS | Status: AC
  Administered 2021-09-17: 150 mg via INTRAVENOUS
  Filled 2021-09-17: qty 150

## 2021-09-17 MED ORDER — DIPHENHYDRAMINE HCL 25 MG PO CAPS
25.0000 mg | ORAL_CAPSULE | Freq: Once | ORAL | Status: AC
  Administered 2021-09-17: 25 mg via ORAL
  Filled 2021-09-17: qty 1

## 2021-09-17 MED ORDER — SODIUM CHLORIDE 0.9 % IV SOLN
Freq: Once | INTRAVENOUS | Status: AC
  Filled 2021-09-17: qty 250

## 2021-09-17 MED ORDER — ACETAMINOPHEN 325 MG PO TABS
650.0000 mg | ORAL_TABLET | Freq: Once | ORAL | Status: AC
  Administered 2021-09-17: 650 mg via ORAL
  Filled 2021-09-17: qty 2

## 2021-09-17 MED ORDER — SODIUM CHLORIDE 0.9 % IV SOLN
10.0000 mg | Freq: Once | INTRAVENOUS | Status: AC
  Administered 2021-09-17: 10 mg via INTRAVENOUS
  Filled 2021-09-17: qty 10

## 2021-09-17 MED ORDER — HEPARIN SOD (PORK) LOCK FLUSH 100 UNIT/ML IV SOLN
500.0000 [IU] | Freq: Once | INTRAVENOUS | Status: AC | PRN
  Administered 2021-09-17: 500 [IU]
  Filled 2021-09-17: qty 5

## 2021-09-17 NOTE — Patient Instructions (Addendum)
[facility] AT [place]  Discharge Instructions: Thank you for choosing [facility] to provide your oncology and hematology care.  If you have a lab appointment with the [facility], please go directly to the [facility] and check in at the registration area.  Wear comfortable clothing and clothing appropriate for easy access to any Portacath or PICC line.   We strive to give you quality time with your provider. You may need to reschedule your appointment if you arrive late (15 or more minutes).  Arriving late affects you and other patients whose appointments are after yours.  Also, if you miss three or more appointments without notifying the office, you may be dismissed from the clinic at the provider's discretion.      For prescription refill requests, have your pharmacy contact our office and allow 72 hours for refills to be completed.    Today you received the following chemotherapy and/or immunotherapy agents Rituxan,Cytoxan, Vincristine, Adriamycin      To help prevent nausea and vomiting after your treatment, we encourage you to take your nausea medication as directed.  BELOW ARE SYMPTOMS THAT SHOULD BE REPORTED IMMEDIATELY: *FEVER GREATER THAN 100.4 F (38 C) OR HIGHER *CHILLS OR SWEATING *NAUSEA AND VOMITING THAT IS NOT CONTROLLED WITH YOUR NAUSEA MEDICATION *UNUSUAL SHORTNESS OF BREATH *UNUSUAL BRUISING OR BLEEDING *URINARY PROBLEMS (pain or burning when urinating, or frequent urination) *BOWEL PROBLEMS (unusual diarrhea, constipation, pain near the anus) TENDERNESS IN MOUTH AND THROAT WITH OR WITHOUT PRESENCE OF ULCERS (sore throat, sores in mouth, or a toothache) UNUSUAL RASH, SWELLING OR PAIN  UNUSUAL VAGINAL DISCHARGE OR ITCHING   Items with * indicate a potential emergency and should be followed up as soon as possible or go to the Emergency Department if any problems should occur.  Please show the CHEMOTHERAPY ALERT CARD or  IMMUNOTHERAPY ALERT CARD at check-in to the Emergency Department and triage nurse.  Should you have questions after your visit or need to cancel or reschedule your appointment, please contact [facility]CER [place] AT [place]  [phone] and follow the prompts.  Office hours are 8:00 a.m. to 4:30 p.m. Monday - Friday. Please note that voicemails left after 4:00 p.m. may not be returned until the following business day.  We are closed weekends and major holidays. You have access to a nurse at all times for urgent questions. Please call the main number to the clinic [phone] and follow the prompts.  For any non-urgent questions, you may also contact your provider using MyChart. We now offer e-Visits for anyone 3 and older to request care online for non-urgent symptoms. For details visit [email or web address].   Also download the MyChart app! Go to the app store, search "MyChart", open the app, select [facility], and log in with your MyChart username and password.  Masks are optional in the cancer centers. If you would like for your care team to wear a mask while they are taking care of you, please let them know. For doctor visits, patients may have with them one support person who is at least 79 years old. At this time, visitors are not allowed in the infusion area. Doxorubicin injection What is this medication? DOXORUBICIN (dox oh ROO bi sin) is a chemotherapy drug. It is used to treat many kinds of cancer like leukemia, lymphoma, neuroblastoma, sarcoma, and Wilms' tumor. It is also used to treat bladder cancer, breast cancer, lung cancer, ovarian cancer, stomach cancer, and thyroid cancer. This medicine may  be used for other purposes; ask your health care provider or pharmacist if you have questions. COMMON BRAND NAME(S): Adriamycin, Adriamycin PFS, Adriamycin RDF, Rubex What should I tell my care team before I take this medication? They need to know if you have any of these  conditions: heart disease history of low blood counts caused by a medicine liver disease recent or ongoing radiation therapy an unusual or allergic reaction to doxorubicin, other chemotherapy agents, other medicines, foods, dyes, or preservatives pregnant or trying to get pregnant breast-feeding How should I use this medication? This drug is given as an infusion into a vein. It is administered in a hospital or clinic by a specially trained health care professional. If you have pain, swelling, burning or any unusual feeling around the site of your injection, tell your health care professional right away. Talk to your pediatrician regarding the use of this medicine in children. Special care may be needed. Overdosage: If you think you have taken too much of this medicine contact a poison control center or emergency room at once. NOTE: This medicine is only for you. Do not share this medicine with others. What if I miss a dose? It is important not to miss your dose. Call your doctor or health care professional if you are unable to keep an appointment. What may interact with this medication? This medicine may interact with the following medications: 6-mercaptopurine paclitaxel phenytoin St. John's Wort trastuzumab verapamil This list may not describe all possible interactions. Give your health care provider a list of all the medicines, herbs, non-prescription drugs, or dietary supplements you use. Also tell them if you smoke, drink alcohol, or use illegal drugs. Some items may interact with your medicine. What should I watch for while using this medication? This drug may make you feel generally unwell. This is not uncommon, as chemotherapy can affect healthy cells as well as cancer cells. Report any side effects. Continue your course of treatment even though you feel ill unless your doctor tells you to stop. There is a maximum amount of this medicine you should receive throughout your life. The  amount depends on the medical condition being treated and your overall health. Your doctor will watch how much of this medicine you receive in your lifetime. Tell your doctor if you have taken this medicine before. You may need blood work done while you are taking this medicine. Your urine may turn red for a few days after your dose. This is not blood. If your urine is dark or brown, call your doctor. In some cases, you may be given additional medicines to help with side effects. Follow all directions for their use. Call your doctor or health care professional for advice if you get a fever, chills or sore throat, or other symptoms of a cold or flu. Do not treat yourself. This drug decreases your body's ability to fight infections. Try to avoid being around people who are sick. This medicine may increase your risk to bruise or bleed. Call your doctor or health care professional if you notice any unusual bleeding. Talk to your doctor about your risk of cancer. You may be more at risk for certain types of cancers if you take this medicine. Do not become pregnant while taking this medicine or for 6 months after stopping it. Women should inform their doctor if they wish to become pregnant or think they might be pregnant. Men should not father a child while taking this medicine and for 6 months after  stopping it. There is a potential for serious side effects to an unborn child. Talk to your health care professional or pharmacist for more information. Do not breast-feed an infant while taking this medicine. This medicine has caused ovarian failure in some women and reduced sperm counts in some men This medicine may interfere with the ability to have a child. Talk with your doctor or health care professional if you are concerned about your fertility. This medicine may cause a decrease in Co-Enzyme Q-10. You should make sure that you get enough Co-Enzyme Q-10 while you are taking this medicine. Discuss the foods you  eat and the vitamins you take with your health care professional. What side effects may I notice from receiving this medication? Side effects that you should report to your doctor or health care professional as soon as possible: allergic reactions like skin rash, itching or hives, swelling of the face, lips, or tongue breathing problems chest pain fast or irregular heartbeat low blood counts - this medicine may decrease the number of white blood cells, red blood cells and platelets. You may be at increased risk for infections and bleeding. pain, redness, or irritation at site where injected signs of infection - fever or chills, cough, sore throat, pain or difficulty passing urine signs of decreased platelets or bleeding - bruising, pinpoint red spots on the skin, black, tarry stools, blood in the urine swelling of the ankles, feet, hands tiredness weakness Side effects that usually do not require medical attention (report to your doctor or health care professional if they continue or are bothersome): diarrhea hair loss mouth sores nail discoloration or damage nausea red colored urine vomiting This list may not describe all possible side effects. Call your doctor for medical advice about side effects. You may report side effects to FDA at 1-800-FDA-1088. Where should I keep my medication? This drug is given in a hospital or clinic and will not be stored at home. NOTE: This sheet is a summary. It may not cover all possible information. If you have questions about this medicine, talk to your doctor, pharmacist, or health care provider.  2023 Elsevier/[employer] Standard (2016-11-11 00:00:00)  Cyclophosphamide Injection What is this medication? CYCLOPHOSPHAMIDE (sye kloe FOSS fa mide) is a chemotherapy drug. It slows the growth of cancer cells. This medicine is used to treat many types of cancer like lymphoma, myeloma, leukemia, breast cancer, and ovarian cancer, to name a few. This medicine  may be used for other purposes; ask your health care provider or pharmacist if you have questions. COMMON BRAND NAME(S): Cyclophosphamide, Cytoxan, Neosar What should I tell my care team before I take this medication? They need to know if you have any of these conditions: heart disease history of irregular heartbeat infection kidney disease liver disease low blood counts, like white cells, platelets, or red blood cells on hemodialysis recent or ongoing radiation therapy scarring or thickening of the lungs trouble passing urine an unusual or allergic reaction to cyclophosphamide, other medicines, foods, dyes, or preservatives pregnant or trying to get pregnant breast-feeding How should I use this medication? This drug is usually given as an injection into a vein or muscle or by infusion into a vein. It is administered in a hospital or clinic by a specially trained health care professional. Talk to your pediatrician regarding the use of this medicine in children. Special care may be needed. Overdosage: If you think you have taken too much of this medicine contact a poison control center or emergency room  at once. NOTE: This medicine is only for you. Do not share this medicine with others. What if I miss a dose? It is important not to miss your dose. Call your doctor or health care professional if you are unable to keep an appointment. What may interact with this medication? amphotericin B azathioprine certain antivirals for HIV or hepatitis certain medicines for blood pressure, heart disease, irregular heart beat certain medicines that treat or prevent blood clots like warfarin certain other medicines for cancer cyclosporine etanercept indomethacin medicines that relax muscles for surgery medicines to increase blood counts metronidazole This list may not describe all possible interactions. Give your health care provider a list of all the medicines, herbs, non-prescription drugs, or  dietary supplements you use. Also tell them if you smoke, drink alcohol, or use illegal drugs. Some items may interact with your medicine. What should I watch for while using this medication? Your condition will be monitored carefully while you are receiving this medicine. You may need blood work done while you are taking this medicine. Drink water or other fluids as directed. Urinate often, even at night. Some products may contain alcohol. Ask your health care professional if this medicine contains alcohol. Be sure to tell all health care professionals you are taking this medicine. Certain medicines, like metronidazole and disulfiram, can cause an unpleasant reaction when taken with alcohol. The reaction includes flushing, headache, nausea, vomiting, sweating, and increased thirst. The reaction can last from 30 minutes to several hours. Do not become pregnant while taking this medicine or for 1 year after stopping it. Women should inform their health care professional if they wish to become pregnant or think they might be pregnant. Men should not father a child while taking this medicine and for 4 months after stopping it. There is potential for serious side effects to an unborn child. Talk to your health care professional for more information. Do not breast-feed an infant while taking this medicine or for 1 week after stopping it. This medicine has caused ovarian failure in some women. This medicine may make it more difficult to get pregnant. Talk to your health care professional if you are concerned about your fertility. This medicine has caused decreased sperm counts in some men. This may make it more difficult to father a child. Talk to your health care professional if you are concerned about your fertility. Call your health care professional for advice if you get a fever, chills, or sore throat, or other symptoms of a cold or flu. Do not treat yourself. This medicine decreases your body's ability to  fight infections. Try to avoid being around people who are sick. Avoid taking medicines that contain aspirin, acetaminophen, ibuprofen, naproxen, or ketoprofen unless instructed by your health care professional. These medicines may hide a fever. Talk to your health care professional about your risk of cancer. You may be more at risk for certain types of cancer if you take this medicine. If you are going to need surgery or other procedure, tell your health care professional that you are using this medicine. Be careful brushing or flossing your teeth or using a toothpick because you may get an infection or bleed more easily. If you have any dental work done, tell your dentist you are receiving this medicine. What side effects may I notice from receiving this medication? Side effects that you should report to your doctor or health care professional as soon as possible: allergic reactions like skin rash, itching or hives, swelling of  the face, lips, or tongue breathing problems nausea, vomiting signs and symptoms of bleeding such as bloody or black, tarry stools; red or dark brown urine; spitting up blood or brown material that looks like coffee grounds; red spots on the skin; unusual bruising or bleeding from the eyes, gums, or nose signs and symptoms of heart failure like fast, irregular heartbeat, sudden weight gain; swelling of the ankles, feet, hands signs and symptoms of infection like fever; chills; cough; sore throat; pain or trouble passing urine signs and symptoms of kidney injury like trouble passing urine or change in the amount of urine signs and symptoms of liver injury like dark yellow or brown urine; general ill feeling or flu-like symptoms; light-colored stools; loss of appetite; nausea; right upper belly pain; unusually weak or tired; yellowing of the eyes or skin Side effects that usually do not require medical attention (report to your doctor or health care professional if they  continue or are bothersome): confusion decreased hearing diarrhea facial flushing hair loss headache loss of appetite missed menstrual periods signs and symptoms of low red blood cells or anemia such as unusually weak or tired; feeling faint or lightheaded; falls skin discoloration This list may not describe all possible side effects. Call your doctor for medical advice about side effects. You may report side effects to FDA at 1-800-FDA-1088. Where should I keep my medication? This drug is given in a hospital or clinic and will not be stored at home. NOTE: This sheet is a summary. It may not cover all possible information. If you have questions about this medicine, talk to your doctor, pharmacist, or health care provider.  2023 Elsevier/[employer] Standard (2021-02-06 00:00:00)  Vincristine injection What is this medication? VINCRISTINE (vin KRIS teen) is a chemotherapy drug. It slows the growth of cancer cells. This medicine is used to treat many types of cancer like Hodgkin's disease, leukemia, non-Hodgkin's lymphoma, neuroblastoma (brain cancer), rhabdomyosarcoma, and Wilms' tumor. This medicine may be used for other purposes; ask your health care provider or pharmacist if you have questions. COMMON BRAND NAME(S): Oncovin, Vincasar PFS What should I tell my care team before I take this medication? They need to know if you have any of these conditions: blood disorders gout infection (especially chickenpox, cold sores, or herpes) kidney disease liver disease lung disease nervous system disease like Charcot-Marie-Tooth (CMT) recent or ongoing radiation therapy an unusual or allergic reaction to vincristine, other chemotherapy agents, other medicines, foods, dyes, or preservatives pregnant or trying to get pregnant breast-feeding How should I use this medication? This drug is given as an infusion into a vein. It is administered in a hospital or clinic by a specially trained health care  professional. If you have pain, swelling, burning, or any unusual feeling around the site of your injection, tell your health care professional right away. Talk to your pediatrician regarding the use of this medicine in children. While this drug may be prescribed for selected conditions, precautions do apply. Overdosage: If you think you have taken too much of this medicine contact a poison control center or emergency room at once. NOTE: This medicine is only for you. Do not share this medicine with others. What if I miss a dose? It is important not to miss your dose. Call your doctor or health care professional if you are unable to keep an appointment. What may interact with this medication? certain medicines for fungal infections like itraconazole, ketoconazole, posaconazole, voriconazole certain medicines for seizures like phenytoin This list may  not describe all possible interactions. Give your health care provider a list of all the medicines, herbs, non-prescription drugs, or dietary supplements you use. Also tell them if you smoke, drink alcohol, or use illegal drugs. Some items may interact with your medicine. What should I watch for while using this medication? This drug may make you feel generally unwell. This is not uncommon, as chemotherapy can affect healthy cells as well as cancer cells. Report any side effects. Continue your course of treatment even though you feel ill unless your doctor tells you to stop. You may need blood work done while you are taking this medicine. This medicine will cause constipation. Try to have a bowel movement at least every 2 to 3 days. If you do not have a bowel movement for 3 days, call your doctor or health care professional. In some cases, you may be given additional medicines to help with side effects. Follow all directions for their use. Do not become pregnant while taking this medicine. Women should inform their doctor if they wish to become pregnant or  think they might be pregnant. There is a potential for serious side effects to an unborn child. Talk to your health care professional or pharmacist for more information. Do not breast-feed an infant while taking this medicine. This medicine may make it more difficult to get pregnant or to father a child. Talk to your healthcare professional if you are concerned about your fertility. What side effects may I notice from receiving this medication? Side effects that you should report to your doctor or health care professional as soon as possible: allergic reactions like skin rash, itching or hives, swelling of the face, lips, or tongue breathing problems confusion or changes in emotions or moods constipation cough mouth sores muscle weakness nausea and vomiting pain, swelling, redness or irritation at the injection site pain, tingling, numbness in the hands or feet problems with balance, talking, walking seizures stomach pain trouble passing urine or change in the amount of urine Side effects that usually do not require medical attention (report to your doctor or health care professional if they continue or are bothersome): diarrhea hair loss jaw pain loss of appetite This list may not describe all possible side effects. Call your doctor for medical advice about side effects. You may report side effects to FDA at 1-800-FDA-1088. Where should I keep my medication? This drug is given in a hospital or clinic and will not be stored at home. NOTE: This sheet is a summary. It may not cover all possible information. If you have questions about this medicine, talk to your doctor, pharmacist, or health care provider.  2023 Elsevier/[employer] Standard (2021-02-06 00:00:00)  Rituximab Injection What is this medication? RITUXIMAB (ri TUX i mab) is a monoclonal antibody. It is used to treat certain types of cancer like non-Hodgkin lymphoma and chronic lymphocytic leukemia. It is also used to treat  rheumatoid arthritis, granulomatosis with polyangiitis, microscopic polyangiitis, and pemphigus vulgaris. This medicine may be used for other purposes; ask your health care provider or pharmacist if you have questions. COMMON BRAND NAME(S): RIABNI, Rituxan, RUXIENCE, truxima What should I tell my care team before I take this medication? They need to know if you have any of these conditions: chest pain heart disease infection especially a viral infection such as chickenpox, cold sores, hepatitis B, or herpes immune system problems irregular heartbeat or rhythm kidney disease low blood counts (white cells, platelets, or red cells) lung disease recent or upcoming vaccine an  unusual or allergic reaction to rituximab, other medicines, foods, dyes, or preservatives pregnant or trying to get pregnant breast-feeding How should I use this medication? This medicine is injected into a vein. It is given by a health care provider in a hospital or clinic setting. A special MedGuide will be given to you before each treatment. Be sure to read this information carefully each time. Talk to your health care provider about the use of this medicine in children. While this drug may be prescribed for children as young as 6 months for selected conditions, precautions do apply. Overdosage: If you think you have taken too much of this medicine contact a poison control center or emergency room at once. NOTE: This medicine is only for you. Do not share this medicine with others. What if I miss a dose? Keep appointments for follow-up doses. It is important not to miss your dose. Call your health care provider if you are unable to keep an appointment. What may interact with this medication? Do not take this medicine with any of the following medicines: live vaccines This medicine may also interact with the following medicines: cisplatin This list may not describe all possible interactions. Give your health care  provider a list of all the medicines, herbs, non-prescription drugs, or dietary supplements you use. Also tell them if you smoke, drink alcohol, or use illegal drugs. Some items may interact with your medicine. What should I watch for while using this medication? Your condition will be monitored carefully while you are receiving this medicine. You may need blood work done while you are taking this medicine. This medicine can cause serious infusion reactions. To reduce the risk your health care provider may give you other medicines to take before receiving this one. Be sure to follow the directions from your health care provider. This medicine may increase your risk of getting an infection. Call your health care provider for advice if you get a fever, chills, sore throat, or other symptoms of a cold or flu. Do not treat yourself. Try to avoid being around people who are sick. Call your health care provider if you are around anyone with measles, chickenpox, or if you develop sores or blisters that do not heal properly. Avoid taking medicines that contain aspirin, acetaminophen, ibuprofen, naproxen, or ketoprofen unless instructed by your health care provider. These medicines may hide a fever. This medicine may cause serious skin reactions. They can happen weeks to months after starting the medicine. Contact your health care provider right away if you notice fevers or flu-like symptoms with a rash. The rash may be red or purple and then turn into blisters or peeling of the skin. Or, you might notice a red rash with swelling of the face, lips or lymph nodes in your neck or under your arms. In some patients, this medicine may cause a serious brain infection that may cause death. If you have any problems seeing, thinking, speaking, walking, or standing, tell your healthcare professional right away. If you cannot reach your healthcare professional, urgently seek other source of medical care. Do not become pregnant  while taking this medicine or for at least 12 months after stopping it. Women should inform their health care provider if they wish to become pregnant or think they might be pregnant. There is potential for serious harm to an unborn child. Talk to your health care provider for more information. Women should use a reliable form of birth control while taking this medicine and for 27  months after stopping it. Do not breast-feed while taking this medicine or for at least 6 months after stopping it. What side effects may I notice from receiving this medication? Side effects that you should report to your health care provider as soon as possible: allergic reactions (skin rash, itching or hives; swelling of the face, lips, or tongue) diarrhea edema (sudden weight gain; swelling of the ankles, feet, hands or other unusual swelling; trouble breathing) fast, irregular heartbeat heart attack (trouble breathing; pain or tightness in the chest, neck, back or arms; unusually weak or tired) infection (fever, chills, cough, sore throat, pain or trouble passing urine) kidney injury (trouble passing urine or change in the amount of urine) liver injury (dark yellow or brown urine; general ill feeling or flu-like symptoms; loss of appetite, right upper belly pain; unusually weak or tired, yellowing of the eyes or skin) low blood pressure (dizziness; feeling faint or lightheaded, falls; unusually weak or tired) low red blood cell counts (trouble breathing; feeling faint; lightheaded, falls; unusually weak or tired) mouth sores redness, blistering, peeling, or loosening of the skin, including inside the mouth stomach pain unusual bruising or bleeding wheezing (trouble breathing with loud or whistling sounds) vomiting Side effects that usually do not require medical attention (report to your health care provider if they continue or are bothersome): headache joint pain muscle cramps, pain nausea This list may not  describe all possible side effects. Call your doctor for medical advice about side effects. You may report side effects to FDA at 1-800-FDA-1088. Where should I keep my medication? This medicine is given in a hospital or clinic. It will not be stored at home. NOTE: This sheet is a summary. It may not cover all possible information. If you have questions about this medicine, talk to your doctor, pharmacist, or health care provider.  2023 [employer] Standard (2020-03-10 00:00:00)

## 2021-09-17 NOTE — Progress Notes (Signed)
Patient here today for follow up and treatment consideration regarding lymphoma.

## 2021-09-18 ENCOUNTER — Inpatient Hospital Stay: Payer: BC Managed Care – PPO

## 2021-09-18 ENCOUNTER — Telehealth: Payer: Self-pay

## 2021-09-18 ENCOUNTER — Encounter: Payer: Self-pay | Admitting: Oncology

## 2021-09-18 DIAGNOSIS — Z5111 Encounter for antineoplastic chemotherapy: Secondary | ICD-10-CM | POA: Diagnosis not present

## 2021-09-18 DIAGNOSIS — C8338 Diffuse large B-cell lymphoma, lymph nodes of multiple sites: Secondary | ICD-10-CM

## 2021-09-18 MED ORDER — PEGFILGRASTIM-CBQV 6 MG/0.6ML ~~LOC~~ SOSY
6.0000 mg | PREFILLED_SYRINGE | Freq: Once | SUBCUTANEOUS | Status: AC
  Administered 2021-09-18: 6 mg via SUBCUTANEOUS
  Filled 2021-09-18: qty 0.6

## 2021-09-18 NOTE — Telephone Encounter (Signed)
Telephone call to patient for follow up after receiving first infusion.   Patient states infusion went great.  States eating good and drinking plenty of fluids.   Denies any nausea or vomiting.  Patient stated she took her Claritin this morning and will take it for 4 consecutive days.  Encouraged patient to call for any concerns or questions.

## 2021-09-21 LAB — SURGICAL PATHOLOGY

## 2021-09-23 ENCOUNTER — Telehealth: Payer: Self-pay

## 2021-09-23 ENCOUNTER — Other Ambulatory Visit: Payer: Self-pay

## 2021-09-23 ENCOUNTER — Ambulatory Visit (HOSPITAL_BASED_OUTPATIENT_CLINIC_OR_DEPARTMENT_OTHER): Payer: 59 | Admitting: Medical Oncology

## 2021-09-23 ENCOUNTER — Encounter: Payer: Self-pay | Admitting: Oncology

## 2021-09-23 ENCOUNTER — Inpatient Hospital Stay
Admission: EM | Admit: 2021-09-23 | Discharge: 2021-09-28 | DRG: 871 | Disposition: A | Discharge status: Home Health | Payer: No Typology Code available for payment source | Attending: Osteopathic Medicine | Admitting: Osteopathic Medicine

## 2021-09-23 ENCOUNTER — Encounter: Payer: Self-pay | Admitting: Medical Oncology

## 2021-09-23 ENCOUNTER — Ambulatory Visit: Payer: BC Managed Care – PPO

## 2021-09-23 ENCOUNTER — Inpatient Hospital Stay: Payer: No Typology Code available for payment source | Attending: Nurse Practitioner

## 2021-09-23 ENCOUNTER — Encounter: Payer: Self-pay | Admitting: Internal Medicine

## 2021-09-23 ENCOUNTER — Emergency Department: Payer: No Typology Code available for payment source

## 2021-09-23 ENCOUNTER — Inpatient Hospital Stay: Payer: No Typology Code available for payment source

## 2021-09-23 VITALS — BP 142/80 | HR 60 | Temp 99.9°F | Resp 20 | Ht 62.0 in | Wt 177.0 lb

## 2021-09-23 DIAGNOSIS — D701 Agranulocytosis secondary to cancer chemotherapy: Secondary | ICD-10-CM

## 2021-09-23 DIAGNOSIS — K1379 Other lesions of oral mucosa: Secondary | ICD-10-CM | POA: Diagnosis not present

## 2021-09-23 DIAGNOSIS — Z79811 Long term (current) use of aromatase inhibitors: Secondary | ICD-10-CM

## 2021-09-23 DIAGNOSIS — C884 Extranodal marginal zone B-cell lymphoma of mucosa-associated lymphoid tissue [MALT-lymphoma]: Secondary | ICD-10-CM | POA: Diagnosis present

## 2021-09-23 DIAGNOSIS — C50412 Malignant neoplasm of upper-outer quadrant of left female breast: Secondary | ICD-10-CM | POA: Diagnosis not present

## 2021-09-23 DIAGNOSIS — Z5111 Encounter for antineoplastic chemotherapy: Secondary | ICD-10-CM | POA: Diagnosis not present

## 2021-09-23 DIAGNOSIS — Z853 Personal history of malignant neoplasm of breast: Secondary | ICD-10-CM

## 2021-09-23 DIAGNOSIS — D61810 Antineoplastic chemotherapy induced pancytopenia: Secondary | ICD-10-CM | POA: Diagnosis present

## 2021-09-23 DIAGNOSIS — Z96653 Presence of artificial knee joint, bilateral: Secondary | ICD-10-CM | POA: Diagnosis present

## 2021-09-23 DIAGNOSIS — J45909 Unspecified asthma, uncomplicated: Secondary | ICD-10-CM | POA: Diagnosis present

## 2021-09-23 DIAGNOSIS — S6010XA Contusion of unspecified finger with damage to nail, initial encounter: Secondary | ICD-10-CM | POA: Diagnosis not present

## 2021-09-23 DIAGNOSIS — A4151 Sepsis due to Escherichia coli [E. coli]: Secondary | ICD-10-CM | POA: Diagnosis present

## 2021-09-23 DIAGNOSIS — R350 Frequency of micturition: Secondary | ICD-10-CM | POA: Diagnosis not present

## 2021-09-23 DIAGNOSIS — E871 Hypo-osmolality and hyponatremia: Secondary | ICD-10-CM | POA: Diagnosis present

## 2021-09-23 DIAGNOSIS — Z9989 Dependence on other enabling machines and devices: Secondary | ICD-10-CM

## 2021-09-23 DIAGNOSIS — D61818 Other pancytopenia: Secondary | ICD-10-CM

## 2021-09-23 DIAGNOSIS — Z7982 Long term (current) use of aspirin: Secondary | ICD-10-CM

## 2021-09-23 DIAGNOSIS — Z66 Do not resuscitate: Secondary | ICD-10-CM | POA: Diagnosis present

## 2021-09-23 DIAGNOSIS — R7989 Other specified abnormal findings of blood chemistry: Secondary | ICD-10-CM | POA: Diagnosis present

## 2021-09-23 DIAGNOSIS — A419 Sepsis, unspecified organism: Secondary | ICD-10-CM | POA: Diagnosis not present

## 2021-09-23 DIAGNOSIS — Y92009 Unspecified place in unspecified non-institutional (private) residence as the place of occurrence of the external cause: Secondary | ICD-10-CM

## 2021-09-23 DIAGNOSIS — R5383 Other fatigue: Secondary | ICD-10-CM | POA: Diagnosis not present

## 2021-09-23 DIAGNOSIS — I10 Essential (primary) hypertension: Secondary | ICD-10-CM | POA: Diagnosis present

## 2021-09-23 DIAGNOSIS — C8338 Diffuse large B-cell lymphoma, lymph nodes of multiple sites: Secondary | ICD-10-CM

## 2021-09-23 DIAGNOSIS — E876 Hypokalemia: Secondary | ICD-10-CM | POA: Diagnosis present

## 2021-09-23 DIAGNOSIS — Z5189 Encounter for other specified aftercare: Secondary | ICD-10-CM | POA: Insufficient documentation

## 2021-09-23 DIAGNOSIS — Z79899 Other long term (current) drug therapy: Secondary | ICD-10-CM

## 2021-09-23 DIAGNOSIS — E860 Dehydration: Secondary | ICD-10-CM | POA: Insufficient documentation

## 2021-09-23 DIAGNOSIS — R197 Diarrhea, unspecified: Secondary | ICD-10-CM

## 2021-09-23 DIAGNOSIS — E039 Hypothyroidism, unspecified: Secondary | ICD-10-CM | POA: Diagnosis present

## 2021-09-23 DIAGNOSIS — Z885 Allergy status to narcotic agent status: Secondary | ICD-10-CM

## 2021-09-23 DIAGNOSIS — Z803 Family history of malignant neoplasm of breast: Secondary | ICD-10-CM

## 2021-09-23 DIAGNOSIS — E872 Acidosis, unspecified: Secondary | ICD-10-CM | POA: Diagnosis present

## 2021-09-23 DIAGNOSIS — W19XXXA Unspecified fall, initial encounter: Secondary | ICD-10-CM | POA: Diagnosis present

## 2021-09-23 DIAGNOSIS — K219 Gastro-esophageal reflux disease without esophagitis: Secondary | ICD-10-CM | POA: Diagnosis present

## 2021-09-23 DIAGNOSIS — G4733 Obstructive sleep apnea (adult) (pediatric): Secondary | ICD-10-CM | POA: Diagnosis present

## 2021-09-23 DIAGNOSIS — Z7901 Long term (current) use of anticoagulants: Secondary | ICD-10-CM

## 2021-09-23 DIAGNOSIS — Z5112 Encounter for antineoplastic immunotherapy: Secondary | ICD-10-CM | POA: Diagnosis present

## 2021-09-23 DIAGNOSIS — E119 Type 2 diabetes mellitus without complications: Secondary | ICD-10-CM | POA: Diagnosis present

## 2021-09-23 DIAGNOSIS — U071 COVID-19: Secondary | ICD-10-CM | POA: Diagnosis present

## 2021-09-23 DIAGNOSIS — Z923 Personal history of irradiation: Secondary | ICD-10-CM

## 2021-09-23 DIAGNOSIS — C8331 Diffuse large B-cell lymphoma, lymph nodes of head, face, and neck: Secondary | ICD-10-CM | POA: Diagnosis present

## 2021-09-23 DIAGNOSIS — D709 Neutropenia, unspecified: Secondary | ICD-10-CM | POA: Diagnosis present

## 2021-09-23 DIAGNOSIS — R7881 Bacteremia: Secondary | ICD-10-CM | POA: Diagnosis present

## 2021-09-23 DIAGNOSIS — A498 Other bacterial infections of unspecified site: Secondary | ICD-10-CM

## 2021-09-23 DIAGNOSIS — R531 Weakness: Secondary | ICD-10-CM | POA: Diagnosis not present

## 2021-09-23 DIAGNOSIS — R5081 Fever presenting with conditions classified elsewhere: Secondary | ICD-10-CM | POA: Diagnosis present

## 2021-09-23 DIAGNOSIS — Z9012 Acquired absence of left breast and nipple: Secondary | ICD-10-CM

## 2021-09-23 DIAGNOSIS — T451X5A Adverse effect of antineoplastic and immunosuppressive drugs, initial encounter: Secondary | ICD-10-CM

## 2021-09-23 DIAGNOSIS — Z79890 Hormone replacement therapy (postmenopausal): Secondary | ICD-10-CM

## 2021-09-23 DIAGNOSIS — C8330 Diffuse large B-cell lymphoma, unspecified site: Secondary | ICD-10-CM | POA: Diagnosis present

## 2021-09-23 DIAGNOSIS — Z8049 Family history of malignant neoplasm of other genital organs: Secondary | ICD-10-CM

## 2021-09-23 DIAGNOSIS — Z1612 Extended spectrum beta lactamase (ESBL) resistance: Secondary | ICD-10-CM | POA: Diagnosis present

## 2021-09-23 DIAGNOSIS — I2510 Atherosclerotic heart disease of native coronary artery without angina pectoris: Secondary | ICD-10-CM | POA: Diagnosis present

## 2021-09-23 DIAGNOSIS — N281 Cyst of kidney, acquired: Secondary | ICD-10-CM | POA: Diagnosis present

## 2021-09-23 DIAGNOSIS — D84821 Immunodeficiency due to drugs: Secondary | ICD-10-CM | POA: Diagnosis present

## 2021-09-23 DIAGNOSIS — Z955 Presence of coronary angioplasty implant and graft: Secondary | ICD-10-CM

## 2021-09-23 DIAGNOSIS — N390 Urinary tract infection, site not specified: Secondary | ICD-10-CM

## 2021-09-23 DIAGNOSIS — S60131A Contusion of right middle finger with damage to nail, initial encounter: Secondary | ICD-10-CM | POA: Diagnosis present

## 2021-09-23 DIAGNOSIS — I480 Paroxysmal atrial fibrillation: Secondary | ICD-10-CM | POA: Diagnosis present

## 2021-09-23 DIAGNOSIS — B9620 Unspecified Escherichia coli [E. coli] as the cause of diseases classified elsewhere: Secondary | ICD-10-CM | POA: Diagnosis not present

## 2021-09-23 HISTORY — DX: Other pancytopenia: D61.818

## 2021-09-23 LAB — COMPREHENSIVE METABOLIC PANEL
ALT: 122 U/L — ABNORMAL HIGH (ref 0–44)
ALT: 140 U/L — ABNORMAL HIGH (ref 0–44)
AST: 115 U/L — ABNORMAL HIGH (ref 15–41)
AST: 124 U/L — ABNORMAL HIGH (ref 15–41)
Albumin: 2.6 g/dL — ABNORMAL LOW (ref 3.5–5.0)
Albumin: 2.8 g/dL — ABNORMAL LOW (ref 3.5–5.0)
Alkaline Phosphatase: 231 U/L — ABNORMAL HIGH (ref 38–126)
Alkaline Phosphatase: 277 U/L — ABNORMAL HIGH (ref 38–126)
Anion gap: 8 (ref 5–15)
Anion gap: 10 (ref 5–15)
BUN: 20 mg/dL (ref 8–23)
BUN: 17 mg/dL (ref 8–23)
CO2: 28 mmol/L (ref 22–32)
CO2: 27 mmol/L (ref 22–32)
Calcium: 8.6 mg/dL — ABNORMAL LOW (ref 8.9–10.3)
Calcium: 8.7 mg/dL — ABNORMAL LOW (ref 8.9–10.3)
Chloride: 97 mmol/L — ABNORMAL LOW (ref 98–111)
Chloride: 97 mmol/L — ABNORMAL LOW (ref 98–111)
Creatinine, Ser: 0.6 mg/dL (ref 0.44–1.00)
Creatinine, Ser: 0.68 mg/dL (ref 0.44–1.00)
GFR, Estimated: >60 mL/min (ref >60)
GFR, Estimated: >60 mL/min (ref >60)
Glucose, Bld: 105 mg/dL — ABNORMAL HIGH (ref 70–99)
Glucose, Bld: 100 mg/dL — ABNORMAL HIGH (ref 70–99)
Potassium: 3.9 mmol/L (ref 3.5–5.1)
Potassium: 3.2 mmol/L — ABNORMAL LOW (ref 3.5–5.1)
Sodium: 133 mmol/L — ABNORMAL LOW (ref 135–145)
Sodium: 134 mmol/L — ABNORMAL LOW (ref 135–145)
Total Bilirubin: 1 mg/dL (ref 0.3–1.2)
Total Bilirubin: 1.5 mg/dL — ABNORMAL HIGH (ref 0.3–1.2)
Total Protein: 6.2 g/dL — ABNORMAL LOW (ref 6.5–8.1)
Total Protein: 6.2 g/dL — ABNORMAL LOW (ref 6.5–8.1)

## 2021-09-23 LAB — URINALYSIS, COMPLETE (UACMP) WITH MICROSCOPIC
Bilirubin Urine: NEGATIVE
Bilirubin Urine: NEGATIVE
Glucose, UA: NEGATIVE mg/dL
Glucose, UA: NEGATIVE mg/dL
Ketones, ur: NEGATIVE mg/dL
Ketones, ur: NEGATIVE mg/dL
Nitrite: POSITIVE — AB
Nitrite: NEGATIVE
Protein, ur: 30 mg/dL — AB
Protein, ur: NEGATIVE mg/dL
Specific Gravity, Urine: 1.01 (ref 1.005–1.030)
Specific Gravity, Urine: 1.008 (ref 1.005–1.030)
WBC, UA: >50 WBC/hpf — ABNORMAL HIGH (ref 0–5)
pH: 6 (ref 5.0–8.0)
pH: 7 (ref 5.0–8.0)

## 2021-09-23 LAB — CBC WITH DIFFERENTIAL/PLATELET
Abs Immature Granulocytes: 0.16 10*3/uL — ABNORMAL HIGH (ref 0.00–0.07)
Abs Immature Granulocytes: 0.01 10*3/uL (ref 0.00–0.07)
Basophils Absolute: 0 10*3/uL (ref 0.0–0.1)
Basophils Absolute: 0 10*3/uL (ref 0.0–0.1)
Basophils Relative: 2 %
Basophils Relative: 0 %
Eosinophils Absolute: 0 10*3/uL (ref 0.0–0.5)
Eosinophils Absolute: 0 10*3/uL (ref 0.0–0.5)
Eosinophils Relative: 3 %
Eosinophils Relative: 14 %
HCT: 33.3 % — ABNORMAL LOW (ref 36.0–46.0)
HCT: 34.4 % — ABNORMAL LOW (ref 36.0–46.0)
Hemoglobin: 10.6 g/dL — ABNORMAL LOW (ref 12.0–15.0)
Hemoglobin: 10.9 g/dL — ABNORMAL LOW (ref 12.0–15.0)
Immature Granulocytes: 24 %
Immature Granulocytes: 14 %
Lymphocytes Relative: 25 %
Lymphocytes Relative: 43 %
Lymphs Abs: 0.2 10*3/uL — ABNORMAL LOW (ref 0.7–4.0)
Lymphs Abs: 0 10*3/uL — ABNORMAL LOW (ref 0.7–4.0)
MCH: 24.1 pg — ABNORMAL LOW (ref 26.0–34.0)
MCH: 23.8 pg — ABNORMAL LOW (ref 26.0–34.0)
MCHC: 31.8 g/dL (ref 30.0–36.0)
MCHC: 31.7 g/dL (ref 30.0–36.0)
MCV: 75.9 fL — ABNORMAL LOW (ref 80.0–100.0)
MCV: 75.1 fL — ABNORMAL LOW (ref 80.0–100.0)
Monocytes Absolute: 0 10*3/uL — ABNORMAL LOW (ref 0.1–1.0)
Monocytes Absolute: 0 10*3/uL — ABNORMAL LOW (ref 0.1–1.0)
Monocytes Relative: 2 %
Monocytes Relative: 0 %
Neutro Abs: 0.3 10*3/uL — CL (ref 1.7–7.7)
Neutro Abs: 0 10*3/uL — CL (ref 1.7–7.7)
Neutrophils Relative %: 44 %
Neutrophils Relative %: 29 %
Platelets: 119 10*3/uL — ABNORMAL LOW (ref 150–400)
Platelets: 90 10*3/uL — ABNORMAL LOW (ref 150–400)
RBC: 4.39 MIL/uL (ref 3.87–5.11)
RBC: 4.58 MIL/uL (ref 3.87–5.11)
RDW: 15.8 % — ABNORMAL HIGH (ref 11.5–15.5)
RDW: 15.7 % — ABNORMAL HIGH (ref 11.5–15.5)
Smear Review: NORMAL
Smear Review: NORMAL
WBC: 0.7 10*3/uL — CL (ref 4.0–10.5)
WBC: <0.1 10*3/uL — CL (ref 4.0–10.5)
nRBC: 0 % (ref 0.0–0.2)
nRBC: 0 % (ref 0.0–0.2)

## 2021-09-23 LAB — LACTIC ACID, PLASMA
Lactic Acid, Venous: 2.4 mmol/L (ref 0.5–1.9)
Lactic Acid, Venous: 1.4 mmol/L (ref 0.5–1.9)

## 2021-09-23 LAB — PROTIME-INR
INR: 1.1 (ref 0.8–1.2)
Prothrombin Time: 14.3 seconds (ref 11.4–15.2)

## 2021-09-23 LAB — RESP PANEL BY RT-PCR (FLU A&B, COVID) ARPGX2
Influenza A by PCR: NEGATIVE
Influenza B by PCR: NEGATIVE
SARS Coronavirus 2 by RT PCR: POSITIVE — AB

## 2021-09-23 LAB — SURGICAL PATHOLOGY

## 2021-09-23 LAB — APTT: aPTT: 24 seconds (ref 24–36)

## 2021-09-23 MED ORDER — ACETAMINOPHEN 650 MG RE SUPP: 650.0000 mg | Freq: Four times a day (QID) | RECTAL | Status: DC | PRN

## 2021-09-23 MED ORDER — SOTALOL HCL 80 MG PO TABS
80.0000 mg | ORAL_TABLET | Freq: Two times a day (BID) | ORAL | Status: DC
Start: 2021-09-23 — End: 2021-09-28
  Administered 2021-09-24 – 2021-09-28 (×9): 80 mg via ORAL
  Filled 2021-09-23 (×11): qty 1

## 2021-09-23 MED ORDER — NYSTATIN 100000 UNIT/ML MT SUSP: 5.0000 mL | Freq: Three times a day (TID) | OROMUCOSAL | 1 refills | Status: DC | PRN

## 2021-09-23 MED ORDER — FAMOTIDINE 20 MG PO TABS
40.0000 mg | ORAL_TABLET | Freq: Every day | ORAL | Status: DC
  Administered 2021-09-23 – 2021-09-27 (×5): 40 mg via ORAL
  Filled 2021-09-23 (×5): qty 2

## 2021-09-23 MED ORDER — ACETAMINOPHEN 325 MG PO TABS
650.0000 mg | ORAL_TABLET | Freq: Once | ORAL | Status: AC
Start: 2021-09-23 — End: 2021-09-23
  Administered 2021-09-23: 650 mg via ORAL
  Filled 2021-09-23: qty 2

## 2021-09-23 MED ORDER — SODIUM CHLORIDE 0.9% FLUSH
10.0000 mL | Freq: Once | INTRAVENOUS | Status: AC
  Administered 2021-09-23: 10 mL via INTRAVENOUS
  Filled 2021-09-23: qty 10

## 2021-09-23 MED ORDER — NYSTATIN 100000 UNIT/ML MT SUSP: 5.0000 mL | Freq: Three times a day (TID) | OROMUCOSAL | 0 refills | Status: DC | PRN

## 2021-09-23 MED ORDER — SODIUM CHLORIDE 0.9 % IV SOLN
1.0000 g | Freq: Three times a day (TID) | INTRAVENOUS | Status: DC
  Administered 2021-09-24 – 2021-09-28 (×14): 1 g via INTRAVENOUS
  Filled 2021-09-23 (×9): qty 20
  Filled 2021-09-23: qty 1
  Filled 2021-09-23 (×6): qty 20

## 2021-09-23 MED ORDER — VANCOMYCIN HCL 1750 MG/350ML IV SOLN: 1750.0000 mg | INTRAVENOUS | Status: DC

## 2021-09-23 MED ORDER — MAGIC MOUTHWASH
5.0000 mL | Freq: Three times a day (TID) | ORAL | Status: DC | PRN
Start: 2021-09-23 — End: 2021-09-28

## 2021-09-23 MED ORDER — LACTATED RINGERS IV BOLUS (SEPSIS)
1000.0000 mL | Freq: Once | INTRAVENOUS | Status: AC
Start: 2021-09-23 — End: 2021-09-23
  Administered 2021-09-23: 1000 mL via INTRAVENOUS

## 2021-09-23 MED ORDER — SERTRALINE HCL 50 MG PO TABS
50.0000 mg | ORAL_TABLET | Freq: Every day | ORAL | Status: DC
Start: 2021-09-23 — End: 2021-09-28
  Administered 2021-09-23 – 2021-09-27 (×5): 50 mg via ORAL
  Filled 2021-09-23 (×5): qty 1

## 2021-09-23 MED ORDER — ONDANSETRON HCL 4 MG PO TABS: 4.0000 mg | ORAL_TABLET | Freq: Four times a day (QID) | ORAL | Status: DC | PRN

## 2021-09-23 MED ORDER — AMLODIPINE BESYLATE 5 MG PO TABS: 5.0000 mg | ORAL_TABLET | Freq: Every day | ORAL | Status: DC

## 2021-09-23 MED ORDER — HYDROCOD POLI-CHLORPHE POLI ER 10-8 MG/5ML PO SUER: 5.0000 mL | Freq: Two times a day (BID) | ORAL | Status: DC | PRN

## 2021-09-23 MED ORDER — APIXABAN 5 MG PO TABS
5.0000 mg | ORAL_TABLET | Freq: Two times a day (BID) | ORAL | Status: DC
  Administered 2021-09-23 – 2021-09-25 (×4): 5 mg via ORAL
  Filled 2021-09-23 (×4): qty 1

## 2021-09-23 MED ORDER — ASPIRIN 81 MG PO TBEC
81.0000 mg | DELAYED_RELEASE_TABLET | Freq: Every day | ORAL | Status: DC
  Administered 2021-09-24 – 2021-09-28 (×5): 81 mg via ORAL
  Filled 2021-09-23 (×5): qty 1

## 2021-09-23 MED ORDER — LACTATED RINGERS IV SOLN
INTRAVENOUS | Status: AC
Start: 2021-09-23 — End: 2021-09-24

## 2021-09-23 MED ORDER — SODIUM CHLORIDE 0.9 % IV SOLN
100.0000 mg | Freq: Every day | INTRAVENOUS | Status: AC
  Administered 2021-09-24 – 2021-09-25 (×2): 100 mg via INTRAVENOUS
  Filled 2021-09-23 (×2): qty 20

## 2021-09-23 MED ORDER — METRONIDAZOLE 500 MG/100ML IV SOLN
500.0000 mg | Freq: Once | INTRAVENOUS | Status: AC
  Administered 2021-09-23: 500 mg via INTRAVENOUS
  Filled 2021-09-23: qty 100

## 2021-09-23 MED ORDER — SODIUM CHLORIDE 0.9 % IV SOLN
Freq: Once | INTRAVENOUS | Status: AC
  Filled 2021-09-23: qty 250

## 2021-09-23 MED ORDER — VANCOMYCIN HCL 1750 MG/350ML IV SOLN
1750.0000 mg | Freq: Once | INTRAVENOUS | Status: AC
  Administered 2021-09-23: 1750 mg via INTRAVENOUS
  Filled 2021-09-23: qty 350

## 2021-09-23 MED ORDER — HEPARIN SOD (PORK) LOCK FLUSH 100 UNIT/ML IV SOLN
500.0000 [IU] | Freq: Once | INTRAVENOUS | Status: AC
  Administered 2021-09-23: 500 [IU]
  Filled 2021-09-23: qty 5

## 2021-09-23 MED ORDER — ATORVASTATIN CALCIUM 20 MG PO TABS
40.0000 mg | ORAL_TABLET | Freq: Every day | ORAL | Status: DC
Start: 2021-09-23 — End: 2021-09-28
  Administered 2021-09-23 – 2021-09-27 (×5): 40 mg via ORAL
  Filled 2021-09-23 (×5): qty 2

## 2021-09-23 MED ORDER — LEVOTHYROXINE SODIUM 25 MCG PO TABS
125.0000 ug | ORAL_TABLET | Freq: Every day | ORAL | Status: DC
  Administered 2021-09-24 – 2021-09-28 (×5): 125 ug via ORAL
  Filled 2021-09-23 (×3): qty 1
  Filled 2021-09-23: qty 3
  Filled 2021-09-23: qty 1

## 2021-09-23 MED ORDER — LETROZOLE 2.5 MG PO TABS
2.5000 mg | ORAL_TABLET | Freq: Every day | ORAL | Status: DC
  Administered 2021-09-24 – 2021-09-28 (×5): 2.5 mg via ORAL
  Filled 2021-09-23 (×5): qty 1

## 2021-09-23 MED ORDER — ONDANSETRON HCL 4 MG/2ML IJ SOLN
4.0000 mg | Freq: Four times a day (QID) | INTRAMUSCULAR | Status: DC | PRN
  Administered 2021-09-25 – 2021-09-28 (×4): 4 mg via INTRAVENOUS
  Filled 2021-09-23 (×4): qty 2

## 2021-09-23 MED ORDER — ISOSORBIDE MONONITRATE ER 30 MG PO TB24
30.0000 mg | ORAL_TABLET | Freq: Every day | ORAL | Status: DC
  Administered 2021-09-24 – 2021-09-28 (×5): 30 mg via ORAL
  Filled 2021-09-23 (×5): qty 1

## 2021-09-23 MED ORDER — NYSTATIN 100000 UNIT/ML MT SUSP: 5.0000 mL | Freq: Three times a day (TID) | OROMUCOSAL | Status: DC | PRN

## 2021-09-23 MED ORDER — SODIUM CHLORIDE 0.9 % IV SOLN
200.0000 mg | Freq: Once | INTRAVENOUS | Status: AC
  Administered 2021-09-24: 200 mg via INTRAVENOUS
  Filled 2021-09-23: qty 40

## 2021-09-23 MED ORDER — GUAIFENESIN-DM 100-10 MG/5ML PO SYRP: 10.0000 mL | ORAL_SOLUTION | ORAL | Status: DC | PRN

## 2021-09-23 MED ORDER — SODIUM CHLORIDE 0.9 % IV SOLN
2.0000 g | INTRAVENOUS | Status: DC
  Administered 2021-09-23: 2 g via INTRAVENOUS
  Filled 2021-09-23: qty 20

## 2021-09-23 MED ORDER — ACETAMINOPHEN 325 MG PO TABS
650.0000 mg | ORAL_TABLET | Freq: Four times a day (QID) | ORAL | Status: DC | PRN
  Administered 2021-09-24 – 2021-09-28 (×4): 650 mg via ORAL
  Filled 2021-09-23 (×5): qty 2

## 2021-09-23 MED ORDER — SULFAMETHOXAZOLE-TRIMETHOPRIM 800-160 MG PO TABS: 1.0000 | ORAL_TABLET | Freq: Two times a day (BID) | ORAL | 0 refills | Status: AC

## 2021-09-23 NOTE — Assessment & Plan Note (Addendum)
   Follow cultures:   ESBL on initial cultures Urine/blood.   Repeat cultures drawn, pending   Continuing meropenem  ID following given bacteremia

## 2021-09-23 NOTE — Assessment & Plan Note (Signed)
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

## 2021-09-23 NOTE — Assessment & Plan Note (Addendum)
   Continue sotalol   Apixiban held d/t finger bleed

## 2021-09-23 NOTE — Assessment & Plan Note (Addendum)
LFTs elevated with AST 124, ALT 140, alk phos 277 and total bili 1.5 Patient had prior cholecystectomy  Hep panel negative  right upper quadrant ultrasound negative  May be d/t chemo, follow periodic CMP

## 2021-09-23 NOTE — Sepsis Progress Note (Signed)
eLink monitoring code sepsis.  

## 2021-09-23 NOTE — Consult Note (Signed)
CODE SEPSIS - PHARMACY COMMUNICATION  **Broad Spectrum Antibiotics should be administered within 1 hour of Sepsis diagnosis**  Time Code Sepsis Called/Page Received: 1841  Antibiotics Ordered: 1841  Time of 1st antibiotic administration: 1917  Additional action taken by pharmacy: n/a  If necessary, Name of Provider/Nurse Contacted: Addis ,PharmD Clinical Pharmacist  09/23/2021  7:09 PM

## 2021-09-23 NOTE — Assessment & Plan Note (Addendum)
No complaints of chest pain  Continue aspirin, atorvastatin, isosorbide, sotalol   nitroglycerin as needed

## 2021-09-23 NOTE — Patient Instructions (Signed)
Pick up Bactrim prescription as well as prescription for magic mouth wash

## 2021-09-23 NOTE — Assessment & Plan Note (Addendum)
Suspecting incidental COVID as patien symptomatic only for mild cough and chest x-ray clear Potential to get worse because of recent Chemo with [facility] and rituximab can make covid worse  Airborne precautions and neutropenic precautions  Remdesivir x5 days total  albuterol, antitussives prn

## 2021-09-23 NOTE — Assessment & Plan Note (Addendum)
   Monitor cell lines: WBC Hgb stabl, Plt down a bit  If worsening may need to consider Oncology consult

## 2021-09-23 NOTE — Telephone Encounter (Signed)
I spoke with patient. She has a bleeding hemorrhoid. Pt reports shortness of breath after ambulation. She feels fatigued. She reports the beginnings of mouth sores. She has diarrhea, but had taken Miralax in the last 24-48 hours to help her constipation. Pt accepted apt and can come to clinic in the next 30 mins to be evaluated. Pt instructed to apply emla cream to her port a cath prior to arrival.  -LABS ENTEREDD  Per PA-  Can she be seen in [facility] today or tomorrow (CBC W/, CMP, Urinalysis, Urine culture). If symptoms worsen go to ER

## 2021-09-23 NOTE — Consult Note (Signed)
PHARMACY -  BRIEF ANTIBIOTIC NOTE   Pharmacy has received consult(s) for Vancomycin from an ED provider.  The patient's profile has been reviewed for ht/wt/allergies/indication/available labs.    One time order(s) placed for Vancomycin '1750mg'$  IV x 1 dose.  Further antibiotics/pharmacy consults should be ordered by admitting physician if indicated.                       Thank you, Pearla Dubonnet 09/23/2021  9:16 PM

## 2021-09-23 NOTE — Progress Notes (Signed)
Symptom Management [place] at [facility] [phone] [phone]  Patient Care Team: Rusty Aus, MD as PCP - General (Internal Medicine) Isaias Cowman, MD as Consulting Physician (Cardiology) Lloyd Huger, MD as Consulting Physician (Oncology)   Name of the patient: Stacey Stone  [id number]  [birth year]   Date of visit: 09/23/21  Reason for Consult: Stacey Stone is a 76 y.o. female who presents today for:  Urinary frequency: Patient presents with her daughter in clinic. For the past 1 day she has had urinary frequency, urgency. No dysuria, hematuria, fever (99.64F in office), nausea, vomiting. She has had mild loose stool which she attributes to using miralax. Mild suprapubic tenderness. No confusion. She has tried to stay hydrated with water for symptoms but has not taken any other medication. On a separate note she is having some mouth sores in mouth attributed to her chemotherapy.     PAST MEDICAL HISTORY: Past Medical History:  Diagnosis Date   Abdominal pain, left lower quadrant    epiploic appendagitis   Anginal pain ([place])    Arthritis 06/06/2018   knee   Asthma    mild   Atrial fibrillation (HCC)    Breast cancer ([place])    Coronary artery disease 06/06/2018   1 artery 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 Mamie Nick. 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   No pertinent past medical history    Personal history of radiation therapy    Sleep apnea 06/06/2018   O2- 2l at bedtime and BiPap @ bedtime    PAST SURGICAL HISTORY:  Past Surgical History:  Procedure Laterality Date   BREAST BIOPSY Left 2/136/2018   INVASIVE MAMMARY CARCINOMA    BREAST BIOPSY Right 05/25/2016   FIBROCYSTIC CHANGE WITH CALCIFICATIONS    BREAST BIOPSY Right 05/29/2020   Affirm bx-"X" clip-FIBROADENOMA WITH ASSOCIATED CALCIFICATIONS. - NEGATIVE   BREAST EXCISIONAL BIOPSY Left 06/04/2016   INVASIVE MAMMARY CARCINOMA.    BREAST LUMPECTOMY Left 06/04/2016   INVASIVE MAMMARY CARCINOMA.    CARDIAC CATHETERIZATION     CARDIAC CATHETERIZATION N/A 09/24/2015   Procedure: Left Heart Cath and Coronary Angiography;  Surgeon: Isaias Cowman, MD;  Location: [place] [facility];  Service: Cardiovascular;  Laterality: N/A;   CARDIAC CATHETERIZATION N/A 09/24/2015   Procedure: Coronary Stent Intervention;  Surgeon: Isaias Cowman, MD;  Location: [place] [facility];  Service: Cardiovascular;  Laterality: N/A;   CHOLECYSTECTOMY  11/26   08/1970   COLONOSCOPY WITH PROPOFOL N/A 07/08/2021   Procedure: COLONOSCOPY WITH PROPOFOL;  Surgeon: Toledo, Benay Pike, MD;  Location: [facility];  Service: Gastroenterology;  Laterality: N/A;   DILATATION & CURETTAGE/HYSTEROSCOPY WITH MYOSURE N/A 05/05/2015   Procedure: Hysteroscopy and endometrial curretage;  Surgeon: Boykin Nearing, MD;  Location: [facility];  Service: Gynecology;  Laterality: N/A;   DILATION AND CURETTAGE OF UTERUS     EXCISION MASS FROM NECK  Right    Dr. Tami Ribas   EYE SURGERY  05/29/2020   bil cataract 9/08   HAMMER TOE SURGERY Right    IR BONE MARROW BIOPSY & ASPIRATION  09/09/2021   IR IMAGING GUIDED PORT INSERTION  09/09/2021   JOINT REPLACEMENT Bilateral 2008, 11/26/ 2012  Partial Knee Replacements   LEFT HEART CATH AND CORONARY ANGIOGRAPHY N/A 10/25/2017   Procedure: LEFT HEART CATH AND CORONARY ANGIOGRAPHY;  Surgeon: Teodoro Spray, MD;  Location: [place] [facility];  Service: Cardiovascular;  Laterality: N/A;   LEFT HEART CATH AND CORONARY ANGIOGRAPHY N/A 10/26/2017   Procedure: LEFT HEART CATH AND CORONARY ANGIOGRAPHY;  Surgeon: Isaias Cowman, MD;  Location: [place]  [facility];  Service: Cardiovascular;  Laterality: N/A;   PARTIAL MASTECTOMY WITH NEEDLE LOCALIZATION Left 06/04/2016   Procedure: PARTIAL MASTECTOMY WITH NEEDLE LOCALIZATION;  Surgeon: Leonie Green, MD;  Location: [facility];  Service: General;  Laterality: Left;   SCLERAL BUCKLE  02/16/2011   Procedure: SCLERAL BUCKLE;  Surgeon: Hayden Pedro, MD;  Location: [place];  Service: Ophthalmology;  Laterality: Left;  Scleral Buckle Left Eye with Headscope Laser   SENTINEL NODE BIOPSY Left 06/04/2016   Procedure: SENTINEL NODE BIOPSY;  Surgeon: Leonie Green, MD;  Location: [facility];  Service: General;  Laterality: Left;    HEMATOLOGY/ONCOLOGY HISTORY:  Oncology History Overview Note  Pathologic stage Ia ER positive, 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 MammaPrint was reported as low risk, therefore she did not require adjuvant chemotherapy. She completed adjuvant XRT. Will complete 5 year of letrozole in July 2023.   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  Patient's most recent CT scan on April 28, 2018 revealed no evidence of progressive or recurrent disease.    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  MEDICATIONS:  Current Outpatient Medications  Medication Sig Dispense Refill   acetaminophen  (TYLENOL) 500 MG tablet Take 500 mg by mouth every 4 (four) hours as needed.     allopurinol (ZYLOPRIM) 300 MG tablet Take 1 tablet (300 mg total) by mouth daily. 30 tablet 3   amLODipine (NORVASC) 5 MG tablet Take 5 mg by mouth daily.     apixaban (ELIQUIS) 5 MG TABS tablet Take 5 mg by mouth 2 (two) times daily.     aspirin 81 MG EC tablet Take 81 mg by mouth daily.      atorvastatin (LIPITOR) 40 MG tablet Take 1 tablet (40 mg total) by mouth at bedtime.     famotidine (PEPCID) 40 MG tablet Take 40 mg by mouth at bedtime.     Fluticasone-Salmeterol (ADVAIR) 100-50 MCG/DOSE AEPB Inhale 1 puff into the lungs 2 (two) times daily as needed (for asthma.).     isosorbide mononitrate (IMDUR) 30 MG 24 hr tablet Take 30 mg by mouth daily.     letrozole (FEMARA) 2.5 MG tablet TAKE 1 TABLET BY MOUTH ONCE DAILY 90 tablet 3   levothyroxine (SYNTHROID) 125 MCG tablet Take 125 mcg by mouth daily.     lidocaine-prilocaine (EMLA) cream Apply to affected area once 30  g 3   magic mouthwash (nystatin, hydrocortisone, diphenhydrAMINE) suspension Swish and spit 5 mLs 3 (three) times daily as needed for mouth pain. 540 mL 0   ondansetron (ZOFRAN) 8 MG tablet Take 1 tablet (8 mg total) by mouth 2 (two) times daily as needed for refractory nausea / vomiting. Start on day 3 after cyclophosphamide chemotherapy. 60 tablet 1   potassium chloride (KLOR-CON) 10 MEQ tablet Take 1 tablet (10 mEq total) by mouth 2 (two) times daily.     prochlorperazine (COMPAZINE) 10 MG tablet Take 1 tablet (10 mg total) by mouth every 6 (six) hours as needed (Nausea or vomiting). 30 tablet 6   RABEprazole (ACIPHEX) 20 MG tablet Take 20 mg by mouth daily.     sertraline (ZOLOFT) 50 MG tablet Take 50 mg by mouth at bedtime.     sotalol (BETAPACE) 80 MG tablet Take 80 mg by mouth 2 (two) times daily.     sulfamethoxazole-trimethoprim (BACTRIM DS) 800-160 MG tablet Take 1 tablet by mouth 2 (two) times daily for 5 days. 10 tablet 0   acidophilus  (RISAQUAD) CAPS capsule Take 1 capsule by mouth daily. (Patient not taking: Reported on 12/15/2020)     diltiazem (CARDIZEM) 30 MG tablet Take 30 mg by mouth 2 (two) times daily as needed (for breakthrough afib).  (Patient not taking: Reported on 07/08/2021)     ferrous sulfate 325 (65 FE) MG tablet Take 325 mg by mouth once a week. (Patient not taking: Reported on 09/23/2021)     nitroGLYCERIN (NITROSTAT) 0.4 MG SL tablet Place 0.4 mg under the tongue every 5 (five) minutes as needed for chest pain. Maximum 3 doses, If no relief call md or 911. (Patient not taking: Reported on 09/23/2021)     predniSONE (DELTASONE) 20 MG tablet Take 5 tablets (100 mg total) by mouth daily. Take with food on days 1-5 of chemotherapy. (Patient not taking: Reported on 09/23/2021) 25 tablet 5   torsemide (DEMADEX) 20 MG tablet Take 10 mg by mouth as directed. Take on Tuesday and Friday (Patient not taking: Reported on 09/23/2021)     No current facility-administered medications for this visit.   Facility-Administered Medications Ordered in Other Visits  Medication Dose Route Frequency Provider Last Rate Last Admin   heparin lock flush 100 unit/mL  500 Units Intracatheter Once Rhetta Cleek M, PA-C        VITAL SIGNS: BP (!) 142/80   Pulse 60   Temp 99.9 F (37.7 C) (Tympanic)   Resp 20   Ht 5' 2"  (1.575 m)   Wt 177 lb (80.3 kg)   SpO2 98%   BMI 32.37 kg/m  Filed Weights   09/23/21 1400  Weight: 177 lb (80.3 kg)    Estimated body mass index is 32.37 kg/m as calculated from the following:   Height as of this encounter: 5' 2"  (1.575 m).   Weight as of this encounter: 177 lb (80.3 kg).  LABS: CBC:    Component Value Date/Time   WBC 0.7 (LL) 09/23/2021 1413   HGB 10.6 (L) 09/23/2021 1413   HGB 13.3 07/03/2014 1503   HCT 33.3 (L) 09/23/2021 1413   HCT 40.8 07/03/2014 1503   PLT 119 (L) 09/23/2021 1413   PLT 240 07/03/2014 1503   MCV 75.9 (L) 09/23/2021 1413   MCV 80 07/03/2014 1503   NEUTROABS 0.3 (LL)  09/23/2021 1413   NEUTROABS 4.4 07/03/2014 1503   LYMPHSABS 0.2 (L) 09/23/2021 1413   LYMPHSABS 2.8 07/03/2014 1503  MONOABS 0.0 (L) 09/23/2021 1413   MONOABS 0.5 07/03/2014 1503   EOSABS 0.0 09/23/2021 1413   EOSABS 0.3 07/03/2014 1503   BASOSABS 0.0 09/23/2021 1413   BASOSABS 0.1 07/03/2014 1503   Comprehensive Metabolic Panel:    Component Value Date/Time   NA 133 (L) 09/23/2021 1413   NA 138 07/03/2014 1503   K 3.9 09/23/2021 1413   K 3.9 07/03/2014 1503   CL 97 (L) 09/23/2021 1413   CL 103 07/03/2014 1503   CO2 28 09/23/2021 1413   CO2 28 07/03/2014 1503   BUN 20 09/23/2021 1413   BUN 16 07/03/2014 1503   CREATININE 0.60 09/23/2021 1413   CREATININE 1.00 07/03/2014 1503   GLUCOSE 105 (H) 09/23/2021 1413   GLUCOSE 101 (H) 07/03/2014 1503   CALCIUM 8.6 (L) 09/23/2021 1413   CALCIUM 9.0 07/03/2014 1503   AST 115 (H) 09/23/2021 1413   AST 29 07/03/2014 1503   ALT 122 (H) 09/23/2021 1413   ALT 34 07/03/2014 1503   ALKPHOS 231 (H) 09/23/2021 1413   ALKPHOS 174 (H) 07/03/2014 1503   BILITOT 1.0 09/23/2021 1413   BILITOT 0.6 07/03/2014 1503   PROT 6.2 (L) 09/23/2021 1413   PROT 7.4 07/03/2014 1503   ALBUMIN 2.6 (L) 09/23/2021 1413   ALBUMIN 4.0 07/03/2014 1503    RADIOGRAPHIC STUDIES: NM Cardiac Muga Rest  Result Date: 09/15/2021 CLINICAL DATA:  Diffuse large B-cell lymphoma, cardiotoxic chemotherapy planned, history of coronary stenting, atrial fibrillation, COPD, prior chemotherapy for breast cancer in 2018 EXAM: NUCLEAR MEDICINE CARDIAC BLOOD POOL IMAGING (MUGA) TECHNIQUE: Cardiac multi-gated acquisition was performed at rest following intravenous injection of Tc-70mlabeled red blood cells. RADIOPHARMACEUTICALS:  22.76 mCi Tc-931mertechnetate in-vitro labeled red blood cells IV COMPARISON:  None Available. FINDINGS: Calculated LEFT ventricular ejection fraction is 75%, upper normal. Study was obtained at a cardiac rate of 67 bpm. Patient was rhythmic during imaging.  Cine analysis of the LEFT ventricle in 3 projections demonstrates normal LV wall motion. IMPRESSION: Upper normal LEFT ventricular ejection fraction of 75% with normal LV wall motion. Electronically Signed   By: MaLavonia Dana.D.   On: 09/15/2021 15:34   IR IMAGING GUIDED PORT INSERTION  Result Date: 09/09/2021 INDICATION: [id number] old female with history of lymphoma requiring central venous access for chemotherapy administration. EXAM: IMPLANTED PORT A CATH PLACEMENT WITH ULTRASOUND AND FLUOROSCOPIC GUIDANCE COMPARISON:  None Available. MEDICATIONS: None. ANESTHESIA/SEDATION: Moderate (conscious) sedation was employed during this procedure. A total of Versed 1 mg and Fentanyl 50 mcg was administered intravenously. Moderate Sedation Time: 14 minutes. The patient's level of consciousness and vital signs were monitored continuously by radiology nursing throughout the procedure under my direct supervision. CONTRAST:  None FLUOROSCOPY TIME:  0 minutes, 18 seconds (3.3 mGy) COMPLICATIONS: None immediate. PROCEDURE: The procedure, risks, benefits, and alternatives were explained to the patient. Questions regarding the procedure were encouraged and answered. The patient understands and consents to the procedure. The right neck and chest were prepped with chlorhexidine in a sterile fashion, and a sterile drape was applied covering the operative field. Maximum barrier sterile technique with sterile gowns and gloves were used for the procedure. A timeout was performed prior to the initiation of the procedure. Ultrasound was used to examine the jugular vein which was compressible and free of internal echoes. A skin marker was used to demarcate the planned venotomy and port pocket incision sites. Local anesthesia was provided to these sites and the subcutaneous tunnel track with 1% lidocaine with 1:100,000  epinephrine. A small incision was created at the jugular access site and blunt dissection was performed of the  subcutaneous tissues. Under ultrasound guidance, the jugular vein was accessed with a 21 ga micropuncture needle and an 0.018" wire was inserted to the superior vena cava. Real-time ultrasound guidance was utilized for vascular access including the acquisition of a permanent ultrasound image documenting patency of the accessed vessel. A 5 Fr micopuncture set was then used, through which a 0.035" Rosen wire was passed under fluoroscopic guidance into the inferior vena cava. An 8 Fr dilator was then placed over the wire. A subcutaneous port pocket was then created along the upper chest wall utilizing a combination of sharp and blunt dissection. The pocket was irrigated with sterile saline, packed with gauze, and observed for hemorrhage. A single lumen standard sized power injectable port was chosen for placement. The 8 Fr catheter was tunneled from the port pocket site to the venotomy incision. The port was placed in the pocket. The external catheter was trimmed to appropriate length. The dilator was exchanged for an 8 Fr peel-away sheath under fluoroscopic guidance. The catheter was then placed through the sheath and the sheath was removed. Final catheter positioning was confirmed and documented with a fluoroscopic spot radiograph. The port was accessed with a Huber needle, aspirated, and flushed with heparinized saline. The deep dermal layer of the port pocket incision was closed with interrupted 3-0 Vicryl suture. The skin was opposed with a running subcuticular 4-0 Monocryl suture. Dermabond was then placed over the port pocket and neck incisions. The patient tolerated the procedure well without immediate post procedural complication. FINDINGS: After catheter placement, the tip lies within the superior cavoatrial junction. The catheter aspirates and flushes normally and is ready for immediate use. IMPRESSION: Successful placement of a power injectable Port-A-Cath via the right internal jugular vein. The catheter  is ready for immediate use. Ruthann Cancer, MD [facility] Radiology Electronically Signed   By: Ruthann Cancer M.D.   On: 09/09/2021 12:02   IR BONE MARROW BIOPSY & ASPIRATION  Result Date: 09/09/2021 INDICATION: 76 year old female with history of lymphoma. EXAM: FLUOROSCOPIC-GUIDED BONE MARROW BIOPSY AND ASPIRATION MEDICATIONS: None ANESTHESIA/SEDATION: Fentanyl 50 mcg IV; Versed 1 mg IV Sedation Time: 14 minutes; The patient was continuously monitored during the procedure by the interventional radiology nurse under my direct supervision. COMPLICATIONS: None immediate. FLUOROSCOPY TIME:  1 minute, 24 seconds, 23.3 mGy PROCEDURE: Informed consent was obtained from the patient following an explanation of the procedure, risks, benefits and alternatives. The patient understands, agrees and consents for the procedure. All questions were addressed. A time out was performed prior to the initiation of the procedure. The patient was positioned prone and fluoroscopy was performed of the pelvis to demonstrate the right iliac marrow space. The operative site was prepped and draped in the usual sterile fashion. Under sterile conditions and local anesthesia, a 22 gauge spinal needle was utilized for procedural planning. Next, an 11 gauge coaxial bone biopsy needle was advanced into the right iliac marrow space. Needle position was confirmed with fluoroscopic imaging. Initially, a bone marrow aspiration was performed. Next, a bone marrow biopsy was obtained with the 11 gauge outer bone marrow device. Samples were prepared with the cytotechnologist and deemed adequate. The needle was removed and superficial hemostasis was obtained with manual compression. A dressing was applied. The patient tolerated the procedure well without immediate post procedural complication. IMPRESSION: Successful fluoroscopic guided right iliac bone marrow aspiration and  core biopsy. Ruthann Cancer, MD  [facility] Radiology Electronically Signed   By: Ruthann Cancer M.D.   On: 09/09/2021 12:01   DG Chest 2 View  Result Date: 09/07/2021 CLINICAL DATA:  Cough.  Shortness of breath. EXAM: CHEST - 2 VIEW COMPARISON:  AP chest 05/24/2019 FINDINGS: Cardiac silhouette is unchanged and within normal limits. There is again a tortuous thoracic aorta. Mild calcification within the aortic arch. The lungs are clear. No pleural effusion or pneumothorax. Mild multilevel degenerative disc changes of the thoracic spine. Mild levocurvature of the mid to upper thoracic spine. IMPRESSION: No active cardiopulmonary disease. Electronically Signed   By: Yvonne Kendall M.D.   On: 09/07/2021 11:48   [place] CORE BIOPSY (LYMPH NODES)  Result Date: 08/31/2021 INDICATION: History of lymphoma.  Hypermetabolic adenopathy on PET-CT EXAM: ULTRASOUND-GUIDED RIGHT CERVICAL NODAL MASS BIOPSY COMPARISON:  PET-CT, 08/19/2021.  CT neck, 08/11/2021 MEDICATIONS: None ANESTHESIA/SEDATION: Local anesthetic was administered. COMPLICATIONS: None immediate. TECHNIQUE: Informed written consent was obtained from the patient and/or patient's representative after a discussion of the risks, benefits and alternatives to treatment. Questions regarding the procedure were encouraged and answered. Initial ultrasound scanning demonstrated hypervascular RIGHT cervical nodal mass. An ultrasound image was saved for documentation purposes. The procedure was planned. A timeout was performed prior to the initiation of the procedure. The operative was prepped and draped in the usual sterile fashion, and a sterile drape was applied covering the operative field. A timeout was performed prior to the initiation of the procedure. Local anesthesia was provided with 1% lidocaine with epinephrine. Under direct ultrasound guidance, an 18 gauge core needle device was utilized to obtain to obtain 3 core needle biopsies of the RIGHT  cervical nodal mass. The samples were placed in saline and submitted to pathology. The needle was removed and hemostasis was achieved with manual compression. Post procedure scan was negative for significant hematoma. A dressing was placed. The patient tolerated the procedure well without immediate postprocedural complication. IMPRESSION: Successful ultrasound guided biopsy of the RIGHT cervical nodal mass, as above. Michaelle Birks, MD [facility] Radiology Electronically Signed   By: Michaelle Birks M.D.   On: 08/31/2021 14:55    PERFORMANCE STATUS (ECOG) : 1 - Symptomatic but completely ambulatory  Review of Systems Unless otherwise noted, a complete review of systems is negative.  Physical Exam General: NAD HEENT: Few small scattered sores of mouth. No swelling or edema  Cardiovascular: regular rate and rhythm Pulmonary: clear ant fields Abdomen: soft, nontender, + bowel sounds, no CVA tenderness GU: no suprapubic tenderness Extremities: no edema, no joint deformities Skin: no rashes Neurological: Weakness but otherwise nonfocal  Assessment and Plan- Patient is a 76 y.o. female    Encounter Diagnoses  Name Primary?   Diffuse large B-cell lymphoma of lymph nodes of multiple regions (HCC)    Mouth sores    Diarrhea, unspecified type    Urinary frequency Yes   Chemotherapy induced neutropenia (HCC)    Elevated LFTs     New. Suspect UTI. Critical WBC and ANC- s/p chemotherapy. Had Udenyca on Friday which should improve counts over the next few days.Given age and A. Fib history flouroquinolones less desirable given side effect profile. Macrobid less likely to be effective. Given risk/benefit Bactrim best option for coverage for her symptoms. Expect mild bump in creatinine (currently normal) which should self resolve. Fluids given and encouraged to help with this. LFTs also elevated- likely secondary to chemotherapy but will  need to be monitored  at her follow up on Tuesday.  Diarrhea likely secondary to Miralax which she will stop- should this fail to improve or worsen she will alert [place]. For her mouth sores I'm going to start her on magic mouthwash.   Discussed red flag signs and symptoms. Keep follow up for next week.    Patient expressed understanding and was in agreement with this plan. She also understands that She can call clinic at any time with any questions, concerns, or complaints.   Thank you for allowing me to participate in the care of this very pleasant patient.   Time Total: 25  Visit consisted of counseling and education dealing with the complex and emotionally intense issues of symptom management in the setting of serious illness.Greater than 50%  of this time was spent counseling and coordinating care related to the above assessment and plan.  Signed by: Nelwyn Salisbury, PA-C

## 2021-09-23 NOTE — Assessment & Plan Note (Addendum)
POA, resolved ANC of 29 based on WBC less than 0.1 and neutrophil percent 29% Likely source UTI and patient also COVID-positive  ID following

## 2021-09-23 NOTE — Assessment & Plan Note (Signed)
CPAP nightly

## 2021-09-23 NOTE — Assessment & Plan Note (Signed)
Started on first cycle chemo on 6/29

## 2021-09-23 NOTE — Progress Notes (Signed)
Pharmacy Antibiotic Note  Stacey Stone is a 76 y.o. female admitted on 09/23/2021 with sepsis.  Pharmacy has been consulted for Vancomycin, meropenem dosing.  Plan: Meropenem 1 gm IV Q8H ordered to start on 7/5 @ 2300.  Vancomycin 1750 mg IV X 1 ordered for 7/5 @ ~ 2300. Vancomycin 1750 mg IV Q48H ordered to start on 7/7 @ 2300.  AUC = 506.8 Vanc trough = 6.9   Height: '5\' 2"'$  (157.5 cm) Weight: 80.2 kg (176 lb 12.9 oz) IBW/kg (Calculated) : 50.1  Temp (24hrs), Avg:100.2 F (37.9 C), Min:99.1 F (37.3 C), Max:101.2 F (38.4 C)  Recent Labs  Lab 09/17/21 0847 09/23/21 1413 09/23/21 1915  WBC 14.3* 0.7* <0.1*  CREATININE 0.95 0.60 0.68  LATICACIDVEN  --   --  2.4*    Estimated Creatinine Clearance: 57.7 mL/min (by C-G formula based on SCr of 0.68 mg/dL).    Allergies  Allergen Reactions   Codeine Other (See Comments)    Stroke like symptoms   Tramadol Itching    Antimicrobials this admission:   >>    >>   Dose adjustments this admission:   Microbiology results:  BCx:   UCx:    Sputum:    MRSA PCR:   Thank you for allowing pharmacy to be a part of this patient's care.  Lelah Rennaker D 09/23/2021 10:41 PM

## 2021-09-23 NOTE — H&P (Signed)
History and Physical    Patient: Stacey Stone GBT:[id number] DOB: 02/23/1946 DOA: 09/23/2021 DOS: the patient was seen and examined on 09/23/2021 PCP: Rusty Aus, MD  Patient coming from: Home  Chief Complaint:  Chief Complaint  Patient presents with   Weakness    Recent diagnosis of UTI, c/o weakness, pt got 532m of fluid this morning at the cancer center, June 29th first chemo treatment, temp of 102 with EMS, hypertensive.   Fever    HPI: Stacey DACRUZis a 76y.o. 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  She was given a dose of Rocephin for suspected UTI.  No culture results available.  Patient also endorses a mild cough.  Denies nausea, vomiting or abdominal pain. ED course and data review: Tmax 101.2, pulse 105, respirations 22 with BP 181/96 and O2 sat 97% on room air. Labs with WBC less than 0.1 with neutrophil percent 29%, hemoglobin 10 and platelets 90,000.  Lactic acid 2.4.  Potassium 3.2.  LFTs elevated with AST 124, ALT 140, alk phos 277 and total bili 1.5.  COVID PCR positive, urinalysis consistent with UTI Chest x-ray with no active disease  Patient treated with Rocephin, vancomycin and Flagyl as well as azithromycin, started on sepsis fluids and hospitalist consulted for admission.    Past Medical History:  Diagnosis Date   Abdominal pain, left lower quadrant    epiploic appendagitis   [place]inal pain ([place]    Arthritis 06/06/2018   knee   Asthma    mild   Atrial fibrillation (HCC)    Breast cancer ([place]    Coronary artery disease 06/06/2018   1 artery 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 [place]amie Nick [facility] [place]  Depression    Diabetes mellitus without complication ([place]    Dyspnea    Dysrhythmia    GERD (gastroesophageal reflux disease)    Hypertension    Hypothyroidism    MALT (mucosa associated lymphoid tissue) ([place] 07/05/2014   Right neck mass resected 01/2014.   No pertinent past medical history    Personal history of radiation therapy    Sleep apnea 06/06/2018   O2- 2l at bedtime and BiPap @ bedtime   Past Surgical History:  Procedure Laterality Date   BREAST BIOPSY Left 2/136/2018   INVASIVE MAMMARY CARCINOMA   BREAST BIOPSY Right 05/25/2016   FIBROCYSTIC CHANGE WITH CALCIFICATIONS    BREAST BIOPSY Right 05/29/2020   Affirm bx-"X" clip-FIBROADENOMA WITH ASSOCIATED CALCIFICATIONS. - NEGATIVE   BREAST EXCISIONAL BIOPSY Left 06/04/2016   INVASIVE MAMMARY CARCINOMA.    BREAST LUMPECTOMY Left 06/04/2016   INVASIVE MAMMARY CARCINOMA.    CARDIAC CATHETERIZATION     CARDIAC CATHETERIZATION N/A 09/24/2015   Procedure: Left Heart Cath and Coronary Angiography;  Surgeon: AIsaias Cowman MD;  [facility];  Service: Cardiovascular;  Laterality: N/A;   CARDIAC CATHETERIZATION N/A 09/24/2015   Procedure: Coronary Stent Intervention;  Surgeon: AIsaias Cowman MD;  [place] [facility];  Service: Cardiovascular;  Laterality: N/A;   CHOLECYSTECTOMY  11/26   08/1970   COLONOSCOPY WITH PROPOFOL N/A 07/08/2021   Procedure: COLONOSCOPY WITH PROPOFOL;  Surgeon: TAlice Reichert TBenay Pike MD;  Location: [facility];  Service: Gastroenterology;  Laterality: N/A;   DILATATION & CURETTAGE/HYSTEROSCOPY WITH MYOSURE N/A 05/05/2015   Procedure: Hysteroscopy and endometrial curretage;  Surgeon: Boykin Nearing, MD;  Location: [facility];  Service: Gynecology;  Laterality: N/A;   DILATION AND CURETTAGE OF UTERUS     EXCISION MASS FROM NECK  Right    Dr. Tami Ribas   EYE SURGERY  05/29/2020   bil cataract 9/08   HAMMER TOE SURGERY Right    IR BONE  MARROW BIOPSY & ASPIRATION  09/09/2021   IR IMAGING GUIDED PORT INSERTION  09/09/2021   JOINT REPLACEMENT Bilateral 2008, 11/26/ 2012   Partial Knee Replacements   LEFT HEART CATH AND CORONARY ANGIOGRAPHY N/A 10/25/2017   Procedure: LEFT HEART CATH AND CORONARY ANGIOGRAPHY;  Surgeon: Teodoro Spray, MD;  Location: [place] [facility];  Service: Cardiovascular;  Laterality: N/A;   LEFT HEART CATH AND CORONARY ANGIOGRAPHY N/A 10/26/2017   Procedure: LEFT HEART CATH AND CORONARY ANGIOGRAPHY;  Surgeon: Isaias Cowman, MD;  Location: [place] [facility];  Service: Cardiovascular;  Laterality: N/A;   PARTIAL MASTECTOMY WITH NEEDLE LOCALIZATION Left 06/04/2016   Procedure: PARTIAL MASTECTOMY WITH NEEDLE LOCALIZATION;  Surgeon: Leonie Green, MD;  Location: [facility];  Service: General;  Laterality: Left;   SCLERAL BUCKLE  02/16/2011   Procedure: SCLERAL BUCKLE;  Surgeon: Hayden Pedro, MD;  Location: [place];  Service: Ophthalmology;  Laterality: Left;  Scleral Buckle Left Eye with Headscope Laser   SENTINEL NODE BIOPSY Left 06/04/2016   Procedure: SENTINEL NODE BIOPSY;  Surgeon: Leonie Green, MD;  Location: [facility];  Service: General;  Laterality: Left;   Social History:  reports that she has never smoked. She has never used smokeless tobacco. She reports that she does not drink alcohol and does not use drugs.  Allergies  Allergen Reactions   Codeine Other (See Comments)    Stroke like symptoms   Tramadol Itching    Family History  Problem Relation Age of Onset   Breast cancer Mother 16   Heart disease Father    Breast cancer Paternal Aunt 65   Breast cancer Cousin    Anesthesia problems Neg Hx    Hypotension Neg Hx    Malignant hyperthermia Neg Hx    Pseudochol deficiency Neg Hx     Prior to Admission medications   Medication Sig Start Date End Date Taking? Authorizing Provider  allopurinol (ZYLOPRIM) 300 MG tablet Take 1 tablet (300 mg total) by mouth daily.  09/11/21  Yes Lloyd Huger, MD  amLODipine (NORVASC) 5 MG tablet Take 5 mg by mouth daily. 09/12/19  Yes [provider]  apixaban (ELIQUIS) 5 MG TABS tablet Take 5 mg by mouth 2 (two) times daily.   Yes [provider]  aspirin 81 MG EC tablet Take 81 mg by mouth daily.    Yes [provider]  atorvastatin (LIPITOR) 40 MG tablet Take 1 tablet (40 mg total) by mouth at bedtime. 05/29/19  Yes Enzo Bi, MD  famotidine (PEPCID) 40 MG tablet Take 40 mg by mouth at bedtime.   Yes [provider]  isosorbide mononitrate (IMDUR) 30 MG 24 hr tablet Take 30 mg by mouth daily.   Yes [provider]  letrozole (FEMARA) 2.5 MG tablet TAKE 1 TABLET BY MOUTH ONCE DAILY 08/11/20  Yes Lloyd Huger, MD  levothyroxine (SYNTHROID) 125 MCG tablet Take 125 mcg by mouth daily.   Yes [provider]  potassium  chloride (KLOR-CON) 10 MEQ tablet Take 1 tablet (10 mEq total) by mouth 2 (two) times daily. 05/29/19  Yes Enzo Bi, MD  RABEprazole (ACIPHEX) 20 MG tablet Take 20 mg by mouth daily.   Yes [provider]  sertraline (ZOLOFT) 50 MG tablet Take 50 mg by mouth at bedtime.   Yes [provider]  sotalol (BETAPACE) 80 MG tablet Take 80 mg by mouth 2 (two) times daily. 09/12/19  Yes [provider]  sulfamethoxazole-trimethoprim (BACTRIM DS) 800-160 MG tablet Take 1 tablet by mouth 2 (two) times daily for 5 days. 09/23/21 09/28/21 Yes Covington, Sarah M, PA-C  acetaminophen (TYLENOL) 500 MG tablet Take 500 mg by mouth every 4 (four) hours as needed.    [provider]  acidophilus (RISAQUAD) CAPS capsule Take 1 capsule by mouth daily. Patient not taking: Reported on 12/15/2020    [provider]  diltiazem (CARDIZEM) 30 MG tablet Take 30 mg by mouth 2 (two) times daily as needed (for breakthrough afib).  Patient not taking: Reported on 07/08/2021    [provider]  ferrous sulfate 325 (65 FE) MG tablet Take  325 mg by mouth once a week. Patient not taking: Reported on 09/23/2021    [provider]  Fluticasone-Salmeterol (ADVAIR) 100-50 MCG/DOSE AEPB Inhale 1 puff into the lungs 2 (two) times daily as needed (for asthma.).    [provider]  lidocaine-prilocaine (EMLA) cream Apply to affected area once 09/11/21   Lloyd Huger, MD  magic mouthwash (nystatin, hydrocortisone, diphenhydrAMINE) suspension Swish and spit 5 mLs 3 (three) times daily as needed for mouth pain. 09/23/21   Hughie Closs, PA-C  nitroGLYCERIN (NITROSTAT) 0.4 MG SL tablet Place 0.4 mg under the tongue every 5 (five) minutes as needed for chest pain. Maximum 3 doses, If no relief call md or 911. Patient not taking: Reported on 09/23/2021    [provider]  ondansetron (ZOFRAN) 8 MG tablet Take 1 tablet (8 mg total) by mouth 2 (two) times daily as needed for refractory nausea / vomiting. Start on day 3 after cyclophosphamide chemotherapy. 09/11/21   Lloyd Huger, MD  predniSONE (DELTASONE) 20 MG tablet Take 5 tablets (100 mg total) by mouth daily. Take with food on days 1-5 of chemotherapy. Patient not taking: Reported on 09/23/2021 09/11/21   Lloyd Huger, MD  prochlorperazine (COMPAZINE) 10 MG tablet Take 1 tablet (10 mg total) by mouth every 6 (six) hours as needed (Nausea or vomiting). 09/11/21   Lloyd Huger, MD  torsemide (DEMADEX) 20 MG tablet Take 10 mg by mouth as directed. Take on Tuesday and Friday Patient not taking: Reported on 09/23/2021    [provider]    Physical Exam: Vitals:   09/23/21 1821 09/23/21 1837 09/23/21 1947 09/23/21 2137  BP:  139/87 (!) 158/73 113/66  Pulse:  94 99 94  Resp:  20 (!) 22 (!) 21  Temp:   (!) 101.2 F (38.4 C) 99.1 F (37.3 C)  TempSrc:   Oral Oral  SpO2:  97% 97% 96%  Weight: 80.2 kg     Height: 5' 2"  (1.575 m)      Physical Exam Vitals and nursing note reviewed.  Constitutional:      General: She is not in acute  distress.    Appearance: She is ill-appearing and toxic-appearing.  HENT:     Head: Normocephalic and atraumatic.  Cardiovascular:     Rate and Rhythm: Normal rate and regular rhythm.  Heart sounds: Normal heart sounds.  Pulmonary:     Effort: Tachypnea present.     Breath sounds: Normal breath sounds.  Abdominal:     Palpations: Abdomen is soft.     Tenderness: There is no abdominal tenderness.  Neurological:     Mental Status: Mental status is at baseline. She is lethargic.     Labs on Admission: I have personally reviewed following labs and imaging studies  CBC: Recent Labs  Lab 09/17/21 0847 09/23/21 1413 09/23/21 1915  WBC 14.3* 0.7* <0.1*  NEUTROABS 11.7* 0.3* 0.0*  HGB 11.0* 10.6* 10.9*  HCT 34.6* 33.3* 34.4*  MCV 77.1* 75.9* 75.1*  PLT 360 119* 90*   Basic Metabolic Panel: Recent Labs  Lab 09/17/21 0847 09/23/21 1413 09/23/21 1915  NA 130* 133* 134*  K 4.2 3.9 3.2*  CL 95* 97* 97*  CO2 24 28 27   GLUCOSE 130* 105* 100*  BUN 12 20 17   CREATININE 0.95 0.60 0.68  CALCIUM 9.1 8.6* 8.7*   GFR: Estimated Creatinine Clearance: 57.7 mL/min (by C-G formula based on SCr of 0.68 mg/dL). Liver Function Tests: Recent Labs  Lab 09/17/21 0847 09/23/21 1413 09/23/21 1915  AST 71* 115* 124*  ALT 58* 122* 140*  ALKPHOS 329* 231* 277*  BILITOT 1.2 1.0 1.5*  PROT 7.2 6.2* 6.2*  ALBUMIN 2.8* 2.6* 2.8*   No results for input(s): "LIPASE", "AMYLASE" in the last 168 hours. No results for input(s): "AMMONIA" in the last 168 hours. Coagulation Profile: Recent Labs  Lab 09/23/21 1915  INR 1.1   Cardiac Enzymes: No results for input(s): "CKTOTAL", "CKMB", "CKMBINDEX", "TROPONINI" in the last 168 hours. BNP (last 3 results) No results for input(s): "PROBNP" in the last 8760 hours. HbA1C: No results for input(s): "HGBA1C" in the last 72 hours. CBG: No results for input(s): "GLUCAP" in the last 168 hours. Lipid Profile: No results for input(s): "CHOL",  "HDL", "LDLCALC", "TRIG", "CHOLHDL", "LDLDIRECT" in the last 72 hours. Thyroid Function Tests: No results for input(s): "TSH", "T4TOTAL", "FREET4", "T3FREE", "THYROIDAB" in the last 72 hours. Anemia Panel: No results for input(s): "VITAMINB12", "FOLATE", "FERRITIN", "TIBC", "IRON", "RETICCTPCT" in the last 72 hours. Urine analysis:    Component Value Date/Time   COLORURINE YELLOW (A) 09/23/2021 1915   APPEARANCEUR CLOUDY (A) 09/23/2021 1915   LABSPEC 1.008 09/23/2021 1915   PHURINE 7.0 09/23/2021 1915   GLUCOSEU NEGATIVE 09/23/2021 1915   HGBUR LARGE (A) 09/23/2021 1915   BILIRUBINUR NEGATIVE 09/23/2021 [place] 09/23/2021 1915   PROTEINUR NEGATIVE 09/23/2021 1915   NITRITE NEGATIVE 09/23/2021 1915   LEUKOCYTESUR LARGE (A) 09/23/2021 1915    Radiological Exams on Admission: DG Chest Port 1 View  Result Date: 09/23/2021 CLINICAL DATA:  A 76 year old female presents with questionable sepsis. EXAM: PORTABLE CHEST 1 VIEW COMPARISON:  September 07, 2021. FINDINGS: EKG leads project over the chest. RIGHT-sided Port-A-Cath terminates at the caval to atrial junction. Cardiomediastinal contours and hilar structures are stable. Lungs are clear. No visible pneumothorax. On limited assessment there is no acute skeletal finding. IMPRESSION: 1. No active disease. 2. RIGHT-sided Port-A-Cath terminates at the caval to atrial junction. 3. Known adenopathy in the chest and lower neck better seen on prior PET imaging Electronically Signed   By: Zetta Bills M.D.   On: 09/23/2021 19:49     Data Reviewed: Relevant notes from primary care and specialist visits, past discharge summaries as available in EHR, including Care Everywhere. Prior diagnostic testing as pertinent to current admission diagnoses  Updated medications and problem lists for reconciliation ED course, including vitals, labs, imaging, treatment and response to treatment Triage notes, nursing and pharmacy notes and ED provider's  notes Notable results as noted in HPI   Assessment and Plan: * Sepsis ([place]) Sepsis criteria includes tachycardia, tachypnea and fever with infection and lactic acidosis Septic sources include UTI, COVID, as well as other sources in view of immunosuppression from chemotherapy Sepsis fluid resuscitation Broad-spectrum antibiotics to treat multiple potential sources as will be outlined below Consider ID consult  COVID-19 virus infection Suspecting incidental COVID as patient symptomatic only for mild cough and chest x-ray clear Airborne precautions and neutropenic precautions Remdesivir, albuterol, antitussives ID consult for additional recommendations  Urinary tract infection Meropenem as above Follow cultures  Neutropenic fever (HCC) ANC of 29 based on WBC less than 0.1 and neutrophil percent 29% Likely source UTI and patient also COVID-positive Meropenem and vancomycin pending further ID recommendations  Abnormal LFTs LFTs elevated with AST 124, ALT 140, alk phos 277 and total bili 1.5 Patient had prior cholecystectomy Follow-up hepatitis panel and right upper quadrant ultrasound   Pancytopenia, chemotherapy induced(HCC) Monitor cell lines    DLBCL (diffuse large B cell lymphoma) ([place]) Started on first cycle chemo on 6/29  History of breast cancer Finishing up letrozole this month.  [name]  Paroxysmal atrial fibrillation (HCC) Continue sotalol and apixaban.  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[place]) Diagnosed in 2017 followed by oncology  Hypothyroidism Continue levothyroxine  Essential hypertension Was mildly elevated on arrival, now controlled Continue sotalol, amlodipine Monitor for hypotension in view of sepsis diagnosis  Coronary artery disease No complaints of chest pain Continue aspirin, atorvastatin, isosorbide, sotalol nitroglycerin as needed        DVT prophylaxis: Apixaban  Consults:  none  Advance Care Planning:   Code Status: Prior DNR, discussed with patient  Family Communication: none  Disposition Plan: Back to previous home environment  Severity of Illness: The appropriate patient status for this patient is INPATIENT. Inpatient status is judged to be reasonable and necessary in order to provide the required intensity of service to ensure the patient's safety. The patient's presenting symptoms, physical exam findings, and initial radiographic and laboratory data in the context of their chronic comorbidities is felt to place them at high risk for further clinical deterioration. Furthermore, it is not anticipated that the patient will be medically stable for discharge from the hospital within 2 midnights of admission.   * I certify that at the point of admission it is my clinical judgment that the patient will require inpatient hospital care spanning beyond 2 midnights from the point of admission due to high intensity of service, high risk for further deterioration and high frequency of surveillance required.*  Author: Athena Masse, MD 09/23/2021 9:44 PM  For on call review [email or web address].

## 2021-09-23 NOTE — Assessment & Plan Note (Signed)
Finishing up letrozole this month.  [name]

## 2021-09-23 NOTE — Telephone Encounter (Signed)
Patient left message stating that she had chemo last Thursday and today she is "a little SOB, has a sore on bottom lip, going to the bathroom about every hour and 15-30 mins and there is blood when wiping that is probably due to an aggravated hernia" call back number is [phone]

## 2021-09-23 NOTE — Assessment & Plan Note (Signed)
Continue levothyroxine 

## 2021-09-23 NOTE — Assessment & Plan Note (Addendum)
Resolved  Treating UTI, bacteremia d/t UTI, COVID

## 2021-09-23 NOTE — Patient Instructions (Signed)
Pick up prescriptions: magic mouth wash (for mouth sores) and bactrim (for your urinary tract infection)

## 2021-09-23 NOTE — ED Provider Notes (Signed)
[facility] Provider Note    None    (approximate)   History   Weakness (Recent diagnosis of UTI, c/o weakness, pt got 537m of fluid this morning at the cancer center, June 29th first chemo treatment, temp of 102 with EMS, hypertensive.) and Fever   HPI  Stacey Stone a 76y.o. 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  Feeling very weak feel like she is falling asleep for the past 24 hours.  She went to the cancer center where she received some IV fluids and a dose of IV antibiotics where she was diagnosed with UTI but started to decline having persistent fevers and concern for sepsis was directed to the ER for further evaluation management.      Physical Exam   Triage Vital Signs: ED Triage Vitals  Enc Vitals Group     BP 09/23/21 1816 (!) 181/96     Pulse Rate 09/23/21 1816 (!) 105     Resp 09/23/21 1816 (!) 22     Temp 09/23/21 1816 (!) 100.4 F (38 C)     Temp Source 09/23/21 1816 Oral     SpO2 09/23/21 1816 97 %     Weight 09/23/21 1821 176 lb 12.9 oz (80.2 kg)     Height 09/23/21 1821 '5\' 2"'$  (1.575 m)     Head Circumference --      Peak Flow --      Pain Score 09/23/21 1821 3     Pain Loc --      Pain Edu? --      Excl. in [place] --     Most recent vital signs: Vitals:   09/23/21 1947 09/23/21 2137  BP: (!) 158/73 113/66  Pulse: 99 94  Resp: (!) 22 (!) 21  Temp: (!) 101.2 F (38.4 C) 99.1 F (37.3 C)  SpO2: 97% 96%     Constitutional: Alert ill appearing Eyes: Conjunctivae are normal.  Head: Atraumatic. Nose: No congestion/rhinnorhea. Mouth/Throat: Mucous membranes are moist.   Neck: Painless ROM.  Cardiovascular:   Good peripheral circulation. Respiratory: Normal respiratory effort.  No retractions.  Gastrointestinal: Soft and nontender.  Musculoskeletal:  no deformity Neurologic:  MAE  spontaneously. No gross focal neurologic deficits are appreciated.  Skin:  Skin is warm, dry and intact. No rash noted. Psychiatric: Mood and affect are normal. Speech and behavior are normal.    ED Results / Procedures / Treatments   Labs (all labs ordered are listed, but only abnormal results are displayed) Labs Reviewed  RESP PANEL BY RT-PCR (FLU A&B, COVID) ARPGX2 - Abnormal; Notable for the following components:      Result Value   SARS Coronavirus 2 by RT PCR POSITIVE (*)    All other components within normal limits  LACTIC ACID, PLASMA - Abnormal; Notable for the following components:   Lactic Acid, Venous 2.4 (*)    All other components within normal limits  COMPREHENSIVE METABOLIC PANEL - Abnormal; Notable for the following components:   Sodium 134 (*)    Potassium 3.2 (*)    Chloride 97 (*)    Glucose, Bld 100 (*)    Calcium 8.7 (*)    Total Protein 6.2 (*)    Albumin 2.8 (*)    AST 124 (*)    ALT 140 (*)    Alkaline Phosphatase 277 (*)  Total Bilirubin 1.5 (*)    All other components within normal limits  CBC WITH DIFFERENTIAL/PLATELET - Abnormal; Notable for the following components:   WBC <0.1 (*)    Hemoglobin 10.9 (*)    HCT 34.4 (*)    MCV 75.1 (*)    MCH 23.8 (*)    RDW 15.7 (*)    Platelets 90 (*)    Neutro Abs 0.0 (*)    Lymphs Abs 0.0 (*)    Monocytes Absolute 0.0 (*)    All other components within normal limits  URINALYSIS, COMPLETE (UACMP) WITH MICROSCOPIC - Abnormal; Notable for the following components:   Color, Urine YELLOW (*)    APPearance CLOUDY (*)    Hgb urine dipstick LARGE (*)    Leukocytes,Ua LARGE (*)    Bacteria, UA MANY (*)    All other components within normal limits  CULTURE, BLOOD (ROUTINE X 2)  CULTURE, BLOOD (ROUTINE X 2)  URINE CULTURE  PROTIME-INR  APTT  LACTIC ACID, PLASMA  PATHOLOGIST SMEAR REVIEW  COMPREHENSIVE METABOLIC PANEL  CBC     EKG ED ECG REPORT I, Merlyn Lot, the attending physician,  personally viewed and interpreted this ECG.   Date: 09/23/2021  EKG Time: 18:12  Rate: 100  Rhythm: sinus  Axis: normal  Intervals:normal  ST&T Change: no depressions, no stemi    RADIOLOGY Please see ED Course for my review and interpretation.  I personally reviewed all radiographic images ordered to evaluate for the above acute complaints and reviewed radiology reports and findings.  These findings were personally discussed with the patient.  Please see medical record for radiology report.    PROCEDURES:  Critical Care performed: No  Procedures   MEDICATIONS ORDERED IN ED: Medications  lactated ringers infusion ( Intravenous New Bag/Given 09/23/21 2133)  vancomycin (VANCOREADY) IVPB 1750 mg/350 mL (1,750 mg Intravenous New Bag/Given 09/23/21 2237)  aspirin EC tablet 81 mg (has no administration in time range)  amLODipine (NORVASC) tablet 5 mg (has no administration in time range)  atorvastatin (LIPITOR) tablet 40 mg (has no administration in time range)  isosorbide mononitrate (IMDUR) 24 hr tablet 30 mg (has no administration in time range)  letrozole [facility]) tablet 2.5 mg (has no administration in time range)  sotalol (BETAPACE) tablet 80 mg (has no administration in time range)  sertraline (ZOLOFT) tablet 50 mg (has no administration in time range)  levothyroxine (SYNTHROID) tablet 125 mcg (has no administration in time range)  famotidine (PEPCID) tablet 40 mg (has no administration in time range)  apixaban (ELIQUIS) tablet 5 mg (has no administration in time range)  acetaminophen (TYLENOL) tablet 650 mg (has no administration in time range)    Or  acetaminophen (TYLENOL) suppository 650 mg (has no administration in time range)  ondansetron (ZOFRAN) tablet 4 mg (has no administration in time range)    Or  ondansetron (ZOFRAN) injection 4 mg (has no administration in time range)  remdesivir 200 mg in sodium chloride 0.9% 250 mL IVPB (has no administration in time range)     Followed by  remdesivir 100 mg in sodium chloride 0.9 % 100 mL IVPB (has no administration in time range)  guaiFENesin-dextromethorphan (ROBITUSSIN DM) 100-10 MG/5ML syrup 10 mL (has no administration in time range)  chlorpheniramine-HYDROcodone 10-8 MG/5ML suspension 5 mL (has no administration in time range)  magic mouthwash (has no administration in time range)  meropenem (MERREM) 1 g in sodium chloride 0.9 % 100 mL IVPB (has no administration in time range)  vancomycin (VANCOREADY) IVPB 1750 mg/350 mL (has no administration in time range)  lactated ringers bolus 1,000 mL (0 mLs Intravenous Stopped 09/23/21 2133)  acetaminophen (TYLENOL) tablet 650 mg (650 mg Oral Given 09/23/21 2030)  metroNIDAZOLE (FLAGYL) IVPB 500 mg (500 mg Intravenous New Bag/Given 09/23/21 2135)     IMPRESSION / MDM / ASSESSMENT AND PLAN / ED COURSE  I reviewed the triage vital signs and the nursing notes.                              Differential diagnosis includes, but is not limited to, sepsis, UTI, pyelonephritis, bacteremia, electrolyte abnormality, anemia, pneumonia  Patient presented to the ER febrile ill-appearing presentation concerning for sepsis in setting of patient being on chemotherapy for lymphoma.  This presenting complaint could reflect a potentially life-threatening illness therefore the patient will be placed on continuous pulse oximetry and telemetry for monitoring.  Laboratory evaluation will be sent to evaluate for the above complaints.  Will give dose of empiric antibiotics given diagnosis of UTI will working up her sepsis.  Anticipate she will require hospitalization.   Clinical Course as of 09/23/21 2248  Wed Sep 23, 2021  1916 X-ray my review and interpretation does not show any evidence pneumothorax. [PR]  2017 Urinalysis consistent with UTI. [PR]  2124 Patient significantly neutropenic we will add on Vanco and Flagyl.  Also found to be COVID-positive. [PR]  2137 Case discussed in  consultation with hospitalist who agreed admit patient [PR]    Clinical Course User Index [PR] Merlyn Lot, MD    Patient's presentation is most consistent with acute presentation with potential threat to life or bodily function.   FINAL CLINICAL IMPRESSION(S) / ED DIAGNOSES   Final diagnoses:  Sepsis, due to unspecified organism, unspecified whether acute organ dysfunction present [facility])     Rx / DC Orders   ED Discharge Orders     None        Note:  This document was prepared using Dragon voice recognition software and may include unintentional dictation errors.    Merlyn Lot, MD 09/23/21 2249

## 2021-09-23 NOTE — Assessment & Plan Note (Addendum)
Was mildly elevated on arrival, now controlled  Continue sotalol, amlodipine  Monitor for hypotension in view of sepsis diagnosis

## 2021-09-24 ENCOUNTER — Encounter: Payer: Self-pay | Admitting: Oncology

## 2021-09-24 ENCOUNTER — Other Ambulatory Visit: Payer: Self-pay

## 2021-09-24 ENCOUNTER — Telehealth: Payer: Self-pay | Admitting: *Deleted

## 2021-09-24 ENCOUNTER — Encounter: Payer: Self-pay | Admitting: Osteopathic Medicine

## 2021-09-24 DIAGNOSIS — U071 COVID-19: Secondary | ICD-10-CM | POA: Diagnosis not present

## 2021-09-24 DIAGNOSIS — D61818 Other pancytopenia: Secondary | ICD-10-CM

## 2021-09-24 DIAGNOSIS — A419 Sepsis, unspecified organism: Secondary | ICD-10-CM | POA: Diagnosis not present

## 2021-09-24 DIAGNOSIS — I2510 Atherosclerotic heart disease of native coronary artery without angina pectoris: Secondary | ICD-10-CM | POA: Diagnosis not present

## 2021-09-24 DIAGNOSIS — I10 Essential (primary) hypertension: Secondary | ICD-10-CM

## 2021-09-24 DIAGNOSIS — C884 Extranodal marginal zone B-cell lymphoma of mucosa-associated lymphoid tissue [MALT-lymphoma]: Secondary | ICD-10-CM

## 2021-09-24 DIAGNOSIS — I2583 Coronary atherosclerosis due to lipid rich plaque: Secondary | ICD-10-CM

## 2021-09-24 DIAGNOSIS — R7989 Other specified abnormal findings of blood chemistry: Secondary | ICD-10-CM

## 2021-09-24 DIAGNOSIS — E039 Hypothyroidism, unspecified: Secondary | ICD-10-CM

## 2021-09-24 DIAGNOSIS — R5081 Fever presenting with conditions classified elsewhere: Secondary | ICD-10-CM

## 2021-09-24 DIAGNOSIS — A498 Other bacterial infections of unspecified site: Secondary | ICD-10-CM | POA: Diagnosis not present

## 2021-09-24 DIAGNOSIS — N3000 Acute cystitis without hematuria: Secondary | ICD-10-CM

## 2021-09-24 DIAGNOSIS — G4733 Obstructive sleep apnea (adult) (pediatric): Secondary | ICD-10-CM

## 2021-09-24 DIAGNOSIS — Z1612 Extended spectrum beta lactamase (ESBL) resistance: Secondary | ICD-10-CM | POA: Diagnosis not present

## 2021-09-24 DIAGNOSIS — C8338 Diffuse large B-cell lymphoma, lymph nodes of multiple sites: Secondary | ICD-10-CM

## 2021-09-24 DIAGNOSIS — I480 Paroxysmal atrial fibrillation: Secondary | ICD-10-CM

## 2021-09-24 DIAGNOSIS — Z9989 Dependence on other enabling machines and devices: Secondary | ICD-10-CM

## 2021-09-24 DIAGNOSIS — R7881 Bacteremia: Secondary | ICD-10-CM | POA: Diagnosis not present

## 2021-09-24 DIAGNOSIS — D709 Neutropenia, unspecified: Secondary | ICD-10-CM

## 2021-09-24 DIAGNOSIS — Z853 Personal history of malignant neoplasm of breast: Secondary | ICD-10-CM

## 2021-09-24 LAB — BLOOD CULTURE ID PANEL (REFLEXED) - BCID2

## 2021-09-24 LAB — HEPATITIS PANEL, ACUTE
HCV Ab: NONREACTIVE
Hep A IgM: NONREACTIVE
Hep B C IgM: NONREACTIVE
Hepatitis B Surface Ag: NONREACTIVE

## 2021-09-24 LAB — COMPREHENSIVE METABOLIC PANEL
ALT: 127 U/L — ABNORMAL HIGH (ref 0–44)
AST: 112 U/L — ABNORMAL HIGH (ref 15–41)
Albumin: 2.4 g/dL — ABNORMAL LOW (ref 3.5–5.0)
Alkaline Phosphatase: 260 U/L — ABNORMAL HIGH (ref 38–126)
Anion gap: 10 (ref 5–15)
BUN: 15 mg/dL (ref 8–23)
CO2: 27 mmol/L (ref 22–32)
Calcium: 7.9 mg/dL — ABNORMAL LOW (ref 8.9–10.3)
Chloride: 101 mmol/L (ref 98–111)
Creatinine, Ser: 0.72 mg/dL (ref 0.44–1.00)
GFR, Estimated: >60 mL/min (ref >60)
Glucose, Bld: 105 mg/dL — ABNORMAL HIGH (ref 70–99)
Potassium: 2.9 mmol/L — ABNORMAL LOW (ref 3.5–5.1)
Sodium: 138 mmol/L (ref 135–145)
Total Bilirubin: 2.1 mg/dL — ABNORMAL HIGH (ref 0.3–1.2)
Total Protein: 5.3 g/dL — ABNORMAL LOW (ref 6.5–8.1)

## 2021-09-24 LAB — CBC
HCT: 31.1 % — ABNORMAL LOW (ref 36.0–46.0)
Hemoglobin: 9.7 g/dL — ABNORMAL LOW (ref 12.0–15.0)
MCH: 23.5 pg — ABNORMAL LOW (ref 26.0–34.0)
MCHC: 31.2 g/dL (ref 30.0–36.0)
MCV: 75.5 fL — ABNORMAL LOW (ref 80.0–100.0)
Platelets: 50 10*3/uL — ABNORMAL LOW (ref 150–400)
RBC: 4.12 MIL/uL (ref 3.87–5.11)
RDW: 15.9 % — ABNORMAL HIGH (ref 11.5–15.5)
WBC: <0.1 10*3/uL — CL (ref 4.0–10.5)
nRBC: 0 % (ref 0.0–0.2)

## 2021-09-24 LAB — PATHOLOGIST SMEAR REVIEW

## 2021-09-24 MED ORDER — CHLORHEXIDINE GLUCONATE CLOTH 2 % EX PADS
6.0000 | MEDICATED_PAD | Freq: Every day | CUTANEOUS | Status: DC
Start: 2021-09-24 — End: 2021-09-28
  Administered 2021-09-24 – 2021-09-28 (×5): 6 via TOPICAL

## 2021-09-24 MED ORDER — POTASSIUM CHLORIDE CRYS ER 20 MEQ PO TBCR
40.0000 meq | EXTENDED_RELEASE_TABLET | Freq: Once | ORAL | Status: AC
Start: 2021-09-24 — End: 2021-09-24
  Administered 2021-09-24: 40 meq via ORAL
  Filled 2021-09-24: qty 2

## 2021-09-24 MED ORDER — VANCOMYCIN HCL 750 MG/150ML IV SOLN: 750.0000 mg | INTRAVENOUS | Status: DC

## 2021-09-24 MED ORDER — POTASSIUM CHLORIDE 10 MEQ/100ML IV SOLN
10.0000 meq | INTRAVENOUS | Status: AC
  Administered 2021-09-24 (×4): 10 meq via INTRAVENOUS
  Filled 2021-09-24 (×4): qty 100

## 2021-09-24 NOTE — Progress Notes (Addendum)
Pharmacy Antibiotic Note  Stacey Stone is a 76 y.o. female admitted on 09/23/2021 with sepsis.  Pharmacy has been consulted for Vancomycin, meropenem dosing.  Plan: Day 2 of abx.  Continue Meropenem 1 gm IV Q8H   Pt received vancomycin loading dose of 1750 mg x 1 followed by 1750 mg q48H. Will adjust vancomycin dose to 750 mg q24H for a predicted AUC of 434. Goal AUC of 400-550. Scr 0.8, IBW, Vd 0.5. Plan to order level after 4th or 5th dose.   Currently bcx growing GNR in bcx. BCID in process. De-escalate abx as appropriate.    Height: '5\' 2"'$  (157.5 cm) Weight: 80.2 kg (176 lb 12.9 oz) IBW/kg (Calculated) : 50.1  Temp (24hrs), Avg:100.2 F (37.9 C), Min:99.1 F (37.3 C), Max:101.2 F (38.4 C)  Recent Labs  Lab 09/17/21 0847 09/23/21 1413 09/23/21 1915 09/23/21 2247 09/24/21 0554  WBC 14.3* 0.7* <0.1*  --  <0.1*  CREATININE 0.95 0.60 0.68  --  0.72  LATICACIDVEN  --   --  2.4* 1.4  --      Estimated Creatinine Clearance: 57.7 mL/min (by C-G formula based on SCr of 0.72 mg/dL).    Allergies  Allergen Reactions   Codeine Other (See Comments)    Stroke like symptoms   Tramadol Itching    Antimicrobials this admission:   >>    >>   Dose adjustments this admission:   Microbiology results: 7/5 BCx: 2 out of 4 bottles GNR in different sets.  7/5 UCx: pending  Thank you for allowing pharmacy to be a part of this patient's care.  Oswald Hillock, PharmD, BCPS 09/24/2021 8:23 AM

## 2021-09-24 NOTE — ED Notes (Signed)
Pt now awake and repositioned in bed to eat breakfast tray

## 2021-09-24 NOTE — ED Notes (Signed)
Pt given meal tray. Pt remains asleep at this time and on 2L [place]. VSS

## 2021-09-24 NOTE — Progress Notes (Signed)
PROGRESS NOTE    Stacey Stone  TIR:[id number] DOB: 1945-05-27  DOA: 09/23/2021 Date of Service: 09/24/21 PCP: Rusty Aus, MD     Brief Narrative / Hospital Course:  Stacey Stone is a 76 y.o. 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 She was sent to ED 09/23/2021 from the cancer center with generalized malaise, weakness, lethargy and a fever over the past 24 hours. She was given a dose of Rocephin for suspected UTI.  No culture results available. Mild cough.  Denies nausea, vomiting or abdominal pain.  07/05: ED course and early admission: Tmax 101.2, pulse 105, respirations 22 with BP 181/96 and O2 sat 97% on room air. Labs with WBC less than 0.1 with neutrophil percent 29%, hemoglobin 10 and platelets 90,000.  Lactic acid 2.4.  Potassium 3.2.  LFTs elevated with AST 124, ALT 140, alk phos 277 and total bili 1.5.  COVID PCR(+), UA c/w UTI. CXR no concerns. Patient treated with Rocephin, vancomycin and Flagyl as well as azithromycin, started on sepsis fluids and hospitalist consulted for admission for sepsis d/t UTI, COVID, other potential source d/t immune compromise, also neutropenic fever likely d/t above Added remdesevir.  07/06: continuing anti-infectives (meropenem, vancomycin, remdesevir), awaiting cultures, may need to involve ID. Hyponatremia resolved. Hypokalemia repleted. AST/ALT trending down but still elevated. Lactate trending down. Still meeting sepsis criteria d/w febrile early this morning (improved), tachycardia (improved), tachypnea, low WBC. ESBL preliminary culture results. Will consult ID  Consultants:  ID  Procedures: none    Subjective: Patient reports feeling tired but overall better than when she came into the ED. No CP/SOB.       ASSESSMENT & PLAN:   Principal Problem:   Sepsis ([place]) Active Problems:   Neutropenic fever ([place])   Urinary tract infection   COVID-19 virus infection   Abnormal LFTs   DLBCL (diffuse large B cell lymphoma) (HCC)   Pancytopenia, chemotherapy induced(HCC)   Coronary artery disease   Essential hypertension   Hypothyroidism   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HCC)   OSA on CPAP   Paroxysmal atrial fibrillation (HCC)   History of breast cancer   Bacteremia   Sepsis ([place]) Sepsis criteria includes tachycardia, tachypnea and fever with infection and lactic acidosis Septic sources include UTI, COVID, as well as other sources in view of immunosuppression from chemotherapy Sepsis fluid resuscitation Antibiotics/antiinfectives for UTI, COVID< ESBL Ecoli bacteremia  ID consult given bacteremia   Neutropenic fever ([place]) ANC of 29 based on WBC less than 0.1 and neutrophil percent 29% Likely source UTI and patient also COVID-positive pending further ID recommendations  Urinary tract infection Meropenem as above Follow cultures ID following given bacteremia  COVID-19 virus infection Suspecting incidental COVID as patien symptomatic only for mild cough and chest x-ray clear Airborne precautions and neutropenic precautions Remdesivir, albuterol, antitussives ID consult for additional recommendations  Pancytopenia, chemotherapy induced(HCC) Monitor cell lines If worsening may need to consider Oncology consult     DLBCL (diffuse large B cell lymphoma) ([place]) Started on first cycle chemo on 6/29  Coronary artery disease No complaints of chest pain Continue aspirin, atorvastatin, isosorbide, sotalol  nitroglycerin as needed  Essential hypertension Was mildly elevated on arrival, now controlled Continue sotalol, amlodipine Monitor for hypotension in view of sepsis diagnosis  Hypothyroidism Continue levothyroxine  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[place] [place] [address]) Diagnosed  in 2017 followed by oncology  OSA on  CPAP CPAP nightly  Paroxysmal atrial fibrillation (HCC) Continue sotalol and apixaban.   Monitor for bleeding  Abnormal LFTs LFTs elevated with AST 124, ALT 140, alk phos 277 and total bili 1.5 Patient had prior cholecystectomy Follow-up hepatitis panel  right upper quadrant ultrasound   History of breast cancer Finishing up letrozole this month.  [name]    DVT prophylaxis: Apixiban (on this at home for [facility]) Code Status: DNR Family Communication: pt declined offer to reach out to family / support person today, her daughter may be around later Disposition Plan / TOC needs: anticipate d/c back tohome environment, may need HH, possibly SNF pending PT/OT eval once more stable  Barriers to discharge / significant pending items: actively treating sepsis w/ multiple possible sources, bacteremia may require IV abx on eventual discharge, neutropenic fever, see A/P              Objective: Vitals:   09/24/21 0800 09/24/21 0900 09/24/21 1000 09/24/21 1120  BP: 129/76 121/89 140/89 136/78  Pulse: 68 64 64 62  Resp: (!) 21 (!) 25 (!) 21 (!) 24  Temp:   98.9 F (37.2 C) 98.3 F (36.8 C)  TempSrc:   Oral Oral  SpO2: 100% 100% 100% 100%  Weight:      Height:        Intake/Output Summary (Last 24 hours) at 09/24/2021 1335 Last data filed at 09/24/2021 0630 Gross per 24 hour  Intake 2026.86 ml  Output 900 ml  Net 1126.86 ml  Net IO Since Admission: 1,126.86 mL [09/24/21 1335]  Filed Weights   09/23/21 1821  Weight: 80.2 kg    Examination:  Constitutional:  VS as above General Appearance: alert, well-developed, well-nourished, NAD Ears, Nose, Mouth, Throat: Normal appearance MMM, posterior pharynx without erythema/exudate Neck: No masses, trachea midline Respiratory: Normal respiratory effort Breath sounds normal except very slight crackles at bases c/w atelectasis  Cardiovascular: S1/S2 normal, no murmur/rub/gallop auscultated No lower extremity  edema Gastrointestinal: Nontender, no masses No hernia appreciated Musculoskeletal:  No clubbing/cyanosis of digits Neurological: No cranial nerve deficit on limited exam Psychiatric: Normal judgment/insight Normal mood and affect       Scheduled Medications:   amLODipine  5 mg Oral - HELD TODAY 09/24/21 D/T SOFT BP  Daily   apixaban  5 mg Oral BID   aspirin EC  81 mg Oral Daily   atorvastatin  40 mg Oral QHS   famotidine  40 mg Oral QHS   isosorbide mononitrate  30 mg Oral Daily   letrozole  2.5 mg Oral Daily   levothyroxine  125 mcg Oral Q0600   sertraline  50 mg Oral QHS   sotalol  80 mg Oral BID    Continuous Infusions:  lactated ringers 150 mL/hr at 09/24/21 0630   meropenem (MERREM) IV Stopped (09/24/21 0630)   remdesivir 100 mg in sodium chloride 0.9 % 100 mL IVPB      PRN Medications:  acetaminophen **OR** acetaminophen, chlorpheniramine-HYDROcodone, guaiFENesin-dextromethorphan, magic mouthwash, ondansetron **OR** ondansetron (ZOFRAN) IV  Antimicrobials:  Anti-infectives (From admission, onward)    Start     Dose/Rate Route Frequency Ordered Stop   09/25/21 2300  vancomycin (VANCOREADY) IVPB 1750 mg/350 mL  Status:  Discontinued        1,750 mg 175 mL/hr over 120 Minutes Intravenous Every 48 hours 09/23/21 2241 09/24/21 0822   09/24/21 2200  vancomycin (VANCOREADY) IVPB 750 mg/150 mL  Status:  Discontinued        750 mg 150 mL/hr over 60 Minutes Intravenous Every 24 hours 09/24/21 0822 09/24/21 0935   09/24/21 1000  remdesivir 100 mg in sodium chloride 0.9 % 100 mL IVPB       See Hyperspace for full Linked Orders Report.   100 mg 200 mL/hr over 30 Minutes Intravenous Daily 09/23/21 2218 09/26/21 0959   09/23/21 2245  meropenem (MERREM) 1 g in sodium chloride 0.9 % 100 mL IVPB        1 g 200 mL/hr over 30 Minutes Intravenous Every 8 hours 09/23/21 2236     09/23/21 2230  remdesivir 200 mg in sodium chloride 0.9% 250 mL IVPB       See Hyperspace for  full Linked Orders Report.   200 mg 580 mL/hr over 30 Minutes Intravenous Once 09/23/21 2218 09/24/21 0248   09/23/21 2130  vancomycin (VANCOREADY) IVPB 1750 mg/350 mL        1,750 mg 175 mL/hr over 120 Minutes Intravenous  Once 09/23/21 2116 09/24/21 0041   09/23/21 2115  metroNIDAZOLE (FLAGYL) IVPB 500 mg        500 mg 100 mL/hr over 60 Minutes Intravenous  Once 09/23/21 2114 09/23/21 2314   09/23/21 1845  cefTRIAXone (ROCEPHIN) 2 g in sodium chloride 0.9 % 100 mL IVPB  Status:  Discontinued        2 g 200 mL/hr over 30 Minutes Intravenous Every 24 hours 09/23/21 1841 09/23/21 2237       Data Reviewed: I have personally reviewed following labs and imaging studies  CBC: Recent Labs  Lab 09/23/21 1413 09/23/21 1915 09/24/21 0554  WBC 0.7* <0.1* <0.1*  NEUTROABS 0.3* 0.0*  --   HGB 10.6* 10.9* 9.7*  HCT 33.3* 34.4* 31.1*  MCV 75.9* 75.1* 75.5*  PLT 119* 90* 50*   Basic Metabolic Panel: Recent Labs  Lab 09/23/21 1413 09/23/21 1915 09/24/21 0554  NA 133* 134* 138  K 3.9 3.2* 2.9*  CL 97* 97* 101  CO2 28 27 27   GLUCOSE 105* 100* 105*  BUN 20 17 15   CREATININE 0.60 0.68 0.72  CALCIUM 8.6* 8.7* 7.9*   GFR: Estimated Creatinine Clearance: 57.7 mL/min (by C-G formula based on SCr of 0.72 mg/dL). Liver Function Tests: Recent Labs  Lab 09/23/21 1413 09/23/21 1915 09/24/21 0554  AST 115* 124* 112*  ALT 122* 140* 127*  ALKPHOS 231* 277* 260*  BILITOT 1.0 1.5* 2.1*  PROT 6.2* 6.2* 5.3*  ALBUMIN 2.6* 2.8* 2.4*   No results for input(s): "LIPASE", "AMYLASE" in the last 168 hours. No results for input(s): "AMMONIA" in the last 168 hours. Coagulation Profile: Recent Labs  Lab 09/23/21 1915  INR 1.1   Cardiac Enzymes: No results for input(s): "CKTOTAL", "CKMB", "CKMBINDEX", "TROPONINI" in the last 168 hours. BNP (last 3 results) No results for input(s): "PROBNP" in the last 8760 hours. HbA1C: No results for input(s): "HGBA1C" in the last 72 hours. CBG: No  results for input(s): "GLUCAP" in the last 168 hours. Lipid Profile: No results for input(s): "CHOL", "HDL", "LDLCALC", "TRIG", "CHOLHDL", "LDLDIRECT" in the last 72 hours. Thyroid Function Tests: No results for input(s): "TSH", "T4TOTAL", "FREET4", "T3FREE", "THYROIDAB" in the last 72 hours. Anemia Panel: No results for input(s): "VITAMINB12", "FOLATE", "FERRITIN", "TIBC", "IRON", "RETICCTPCT" in the last 72 hours. Urine analysis:    Component Value Date/Time   COLORURINE YELLOW (A) 09/23/2021 1915   APPEARANCEUR CLOUDY (A) 09/23/2021 1915   LABSPEC 1.008 09/23/2021 1915  PHURINE 7.0 09/23/2021 1915   GLUCOSEU NEGATIVE 09/23/2021 1915   HGBUR LARGE (A) 09/23/2021 1915   BILIRUBINUR NEGATIVE 09/23/2021 [place] 09/23/2021 1915   PROTEINUR NEGATIVE 09/23/2021 1915   NITRITE NEGATIVE 09/23/2021 1915   LEUKOCYTESUR LARGE (A) 09/23/2021 1915   Sepsis Labs: @LABRCNTIP (procalcitonin:4,lacticidven:4)  Recent Results (from the past 240 hour(s))  Resp Panel by RT-PCR (Flu A&B, Covid) Anterior Nasal Swab     Status: Abnormal   Collection Time: 09/23/21  7:30 PM   Specimen: Anterior Nasal Swab  Result Value Ref Range Status   SARS Coronavirus 2 by RT PCR POSITIVE (A) NEGATIVE Final    Comment: RESULT CALLED TO, READ BACK BY AND VERIFIED WITH: ASHLEY ORSUTO AT 2057 ON 09/23/21 BY SS (NOTE) SARS-CoV-2 target nucleic acids are DETECTED.  The SARS-CoV-2 RNA is generally detectable in upper respiratory specimens during the acute phase of infection. Positive results are indicative of the presence of the identified virus, but do not rule out bacterial infection or co-infection with other pathogens not detected by the test. Clinical correlation with patient history and other diagnostic information is necessary to determine patient infection status. The expected result is Negative.  Fact Sheet for Patients: EntrepreneurPulse.com.au  Fact Sheet for  Healthcare Providers: IncredibleEmployment.be  This test is not yet approved or cleared by the [place] FDA and  has been authorized for detection and/or diagnosis of SARS-CoV-2 by FDA under an Emergency Use Authorization (EUA).  This EUA will remain in effect (meaning this test ca n be used) for the duration of  the COVID-19 declaration under Section 564(b)(1) of the Act, 21 U.S.C. section 360bbb-3(b)(1), unless the authorization is terminated or revoked sooner.     Influenza A by PCR NEGATIVE NEGATIVE Final   Influenza B by PCR NEGATIVE NEGATIVE Final    Comment: (NOTE) The Xpert Xpress SARS-CoV-2/FLU/RSV plus assay is intended as an aid in the diagnosis of influenza from Nasopharyngeal swab specimens and should not be used as a sole basis for treatment. Nasal washings and aspirates are unacceptable for Xpert Xpress SARS-CoV-2/FLU/RSV testing.  Fact Sheet for Patients: EntrepreneurPulse.com.au  Fact Sheet for Healthcare Providers: IncredibleEmployment.be  This test is not yet approved or cleared by the [place] FDA and has been authorized for detection and/or diagnosis of SARS-CoV-2 by FDA under an Emergency Use Authorization (EUA). This EUA will remain in effect (meaning this test can be used) for the duration of the COVID-19 declaration under Section 564(b)(1) of the Act, 21 U.S.C. section 360bbb-3(b)(1), unless the authorization is terminated or revoked.  Performed at [facility], [address] [place], [place] [address]   Blood Culture (routine x 2)     Status: None (Preliminary result)   Collection Time: 09/23/21  7:30 PM   Specimen: BLOOD  Result Value Ref Range Status   Specimen Description BLOOD BLOOD RIGHT HAND  Final   Special Requests   Final    BOTTLES DRAWN AEROBIC AND ANAEROBIC Blood Culture adequate volume   Culture  Setup Time   Final    GRAM NEGATIVE RODS IN BOTH AEROBIC AND  ANAEROBIC BOTTLES Organism ID to follow CRITICAL RESULT CALLED TO, READ BACK BY AND VERIFIED WITH: Ricci Barker M. PHARMD [id number] 09/24/21 HNM Performed at [facility], [address] [place], [place] [address]    Culture GRAM NEGATIVE RODS  Final   Report Status PENDING  Incomplete  Blood Culture ID Panel (Reflexed)     Status: Abnormal   Collection Time:  09/23/21  7:30 PM  Result Value Ref Range Status   Enterococcus faecalis NOT DETECTED NOT DETECTED Final   Enterococcus Faecium NOT DETECTED NOT DETECTED Final   Listeria monocytogenes NOT DETECTED NOT DETECTED Final   Staphylococcus species NOT DETECTED NOT DETECTED Final   Staphylococcus aureus (BCID) NOT DETECTED NOT DETECTED Final   Staphylococcus epidermidis NOT DETECTED NOT DETECTED Final   Staphylococcus lugdunensis NOT DETECTED NOT DETECTED Final   Streptococcus species NOT DETECTED NOT DETECTED Final   Streptococcus agalactiae NOT DETECTED NOT DETECTED Final   Streptococcus pneumoniae NOT DETECTED NOT DETECTED Final   Streptococcus pyogenes NOT DETECTED NOT DETECTED Final   A.calcoaceticus-baumannii NOT DETECTED NOT DETECTED Final   Bacteroides fragilis NOT DETECTED NOT DETECTED Final   Enterobacterales DETECTED (A) NOT DETECTED Final    Comment: Enterobacterales represent a large order of gram negative bacteria, not a single organism. CRITICAL RESULT CALLED TO, READ BACK BY AND VERIFIED WITH: ELVIRA M. PHARMD [id number] 09/24/21 HNM    Enterobacter cloacae complex NOT DETECTED NOT DETECTED Final   Escherichia coli DETECTED (A) NOT DETECTED Final    Comment: CRITICAL RESULT CALLED TO, READ BACK BY AND VERIFIED WITH: ELVIRA M. PHARMD [id number] 09/24/21 HNM    Klebsiella aerogenes NOT DETECTED NOT DETECTED Final   Klebsiella oxytoca NOT DETECTED NOT DETECTED Final   Klebsiella pneumoniae NOT DETECTED NOT DETECTED Final   Proteus species NOT DETECTED NOT DETECTED Final   Salmonella species NOT DETECTED NOT DETECTED Final   Serratia  marcescens NOT DETECTED NOT DETECTED Final   Haemophilus influenzae NOT DETECTED NOT DETECTED Final   Neisseria meningitidis NOT DETECTED NOT DETECTED Final   Pseudomonas aeruginosa NOT DETECTED NOT DETECTED Final   Stenotrophomonas maltophilia NOT DETECTED NOT DETECTED Final   Candida albicans NOT DETECTED NOT DETECTED Final   Candida auris NOT DETECTED NOT DETECTED Final   Candida glabrata NOT DETECTED NOT DETECTED Final   Candida krusei NOT DETECTED NOT DETECTED Final   Candida parapsilosis NOT DETECTED NOT DETECTED Final   Candida tropicalis NOT DETECTED NOT DETECTED Final   Cryptococcus neoformans/gattii NOT DETECTED NOT DETECTED Final   CTX-M ESBL DETECTED (A) NOT DETECTED Final    Comment: CRITICAL RESULT CALLED TO, READ BACK BY AND VERIFIED WITH: ELVIRA M. PHARMD [id number] 09/24/21 HNM (NOTE) Extended spectrum beta-lactamase detected. Recommend a carbapenem as initial therapy.      Carbapenem resistance IMP NOT DETECTED NOT DETECTED Final   Carbapenem resistance KPC NOT DETECTED NOT DETECTED Final   Carbapenem resistance NDM NOT DETECTED NOT DETECTED Final   Carbapenem resist OXA 48 LIKE NOT DETECTED NOT DETECTED Final   Carbapenem resistance VIM NOT DETECTED NOT DETECTED Final    Comment: Performed at [facility], [address] [place], [place] [address]  Blood Culture (routine x 2)     Status: None (Preliminary result)   Collection Time: 09/23/21  8:34 PM   Specimen: BLOOD  Result Value Ref Range Status   Specimen Description BLOOD BLOOD RIGHT WRIST  Final   Special Requests   Final    BOTTLES DRAWN AEROBIC AND ANAEROBIC Blood Culture results may not be optimal due to an inadequate volume of blood received in culture bottles   Culture  Setup Time   Final    GRAM NEGATIVE RODS IN BOTH AEROBIC AND ANAEROBIC BOTTLES CRITICAL VALUE NOTED.  VALUE IS CONSISTENT WITH PREVIOUSLY REPORTED AND CALLED VALUE. Performed at [facility], [address]  [place], [place] [address]    Culture  GRAM NEGATIVE RODS  Final   Report Status PENDING  Incomplete         Radiology Studies last 96 hours: US Abdomen Limited RUQ (LIVER/GB)  Result Date: 09/23/2021 CLINICAL DATA:  Elevated LFTs EXAM: ULTRASOUND ABDOMEN LIMITED RIGHT UPPER QUADRANT COMPARISON:  01/06/2021 FINDINGS: Gallbladder: Surgically removed Common bile duct: Diameter: 5.4 mm. Liver: No focal lesion identified. Within normal limits in parenchymal echogenicity. 3 mm calcification is noted in the right lobe of the liver stable from prior CT. Portal vein is patent on color Doppler imaging with normal direction of blood flow towards the liver. Other: None. IMPRESSION: Status post cholecystectomy. No acute abnormality to correspond with the given clinical history is noted. Electronically Signed   By: Inez Catalina M.D.   On: 09/23/2021 23:30   DG Chest Port 1 View  Result Date: 09/23/2021 CLINICAL DATA:  A 76 year old female presents with questionable sepsis. EXAM: PORTABLE CHEST 1 VIEW COMPARISON:  September 07, 2021. FINDINGS: EKG leads project over the chest. RIGHT-sided Port-A-Cath terminates at the caval to atrial junction. Cardiomediastinal contours and hilar structures are stable. Lungs are clear. No visible pneumothorax. On limited assessment there is no acute skeletal finding. IMPRESSION: 1. No active disease. 2. RIGHT-sided Port-A-Cath terminates at the caval to atrial junction. 3. Known adenopathy in the chest and lower neck better seen on prior PET imaging Electronically Signed   By: Zetta Bills M.D.   On: 09/23/2021 19:49            LOS: 1 day    Time spent: 50 min    Emeterio Reeve, DO [facility] 09/24/2021, 1:35 PM   Staff may message me via secure chat in [place]  but this may not receive immediate response,  please page for urgent matters!  If 7PM-7AM, please contact night-coverage [email or web address]  Dictation software was used to generate the above note. Typos  may occur and escape review, as with typed/written notes. Please contact Dr Sheppard Coil directly for clarity if needed.

## 2021-09-24 NOTE — Progress Notes (Signed)
PHARMACY - PHYSICIAN COMMUNICATION CRITICAL VALUE ALERT - BLOOD CULTURE IDENTIFICATION (BCID)  Stacey Stone is an 76 y.o. female who presented to [facility] on 09/23/2021 with a chief complaint of weakness   Assessment:  7/5 blood cx with GNR, BCID detects ESBL E coli  Name of physician (or Provider) Contacted: Dr Sheppard Coil  Current antibiotics: vanco/meropenem  Changes to prescribed antibiotics recommended:  Recommendations accepted by provider - stop vanco  Results for orders placed or performed during the hospital encounter of 09/23/21  Blood Culture ID Panel (Reflexed) (Collected: 09/23/2021  7:30 PM)  Result Value Ref Range   Enterococcus faecalis NOT DETECTED NOT DETECTED   Enterococcus Faecium NOT DETECTED NOT DETECTED   Listeria monocytogenes NOT DETECTED NOT DETECTED   Staphylococcus species NOT DETECTED NOT DETECTED   Staphylococcus aureus (BCID) NOT DETECTED NOT DETECTED   Staphylococcus epidermidis NOT DETECTED NOT DETECTED   Staphylococcus lugdunensis NOT DETECTED NOT DETECTED   Streptococcus species NOT DETECTED NOT DETECTED   Streptococcus agalactiae NOT DETECTED NOT DETECTED   Streptococcus pneumoniae NOT DETECTED NOT DETECTED   Streptococcus pyogenes NOT DETECTED NOT DETECTED   A.calcoaceticus-baumannii NOT DETECTED NOT DETECTED   Bacteroides fragilis NOT DETECTED NOT DETECTED   Enterobacterales DETECTED (A) NOT DETECTED   Enterobacter cloacae complex NOT DETECTED NOT DETECTED   Escherichia coli DETECTED (A) NOT DETECTED   Klebsiella aerogenes NOT DETECTED NOT DETECTED   Klebsiella oxytoca NOT DETECTED NOT DETECTED   Klebsiella pneumoniae NOT DETECTED NOT DETECTED   Proteus species NOT DETECTED NOT DETECTED   Salmonella species NOT DETECTED NOT DETECTED   Serratia marcescens NOT DETECTED NOT DETECTED   Haemophilus influenzae NOT DETECTED NOT DETECTED   Neisseria meningitidis NOT DETECTED NOT DETECTED   Pseudomonas aeruginosa NOT DETECTED NOT DETECTED    Stenotrophomonas maltophilia NOT DETECTED NOT DETECTED   Candida albicans NOT DETECTED NOT DETECTED   Candida auris NOT DETECTED NOT DETECTED   Candida glabrata NOT DETECTED NOT DETECTED   Candida krusei NOT DETECTED NOT DETECTED   Candida parapsilosis NOT DETECTED NOT DETECTED   Candida tropicalis NOT DETECTED NOT DETECTED   Cryptococcus neoformans/gattii NOT DETECTED NOT DETECTED   CTX-M ESBL DETECTED (A) NOT DETECTED   Carbapenem resistance IMP NOT DETECTED NOT DETECTED   Carbapenem resistance KPC NOT DETECTED NOT DETECTED   Carbapenem resistance NDM NOT DETECTED NOT DETECTED   Carbapenem resist OXA 48 LIKE NOT DETECTED NOT DETECTED   Carbapenem resistance VIM NOT DETECTED NOT DETECTED    Doreene Eland, PharmD, BCPS, BCIDP Work Cell: [phone] 09/24/2021 10:27 AM

## 2021-09-24 NOTE — Progress Notes (Signed)
Per Dr Alexander, dc tele monitoring  

## 2021-09-24 NOTE — Telephone Encounter (Signed)
830 am - Reviewed chart to call patient s/p [facility] visit on 09/23/21. Patient currently in ER - code sepsis- fever of 102; hypertensive.  Judson Roch, PA and Dr. Grayland Ormond informed of patient's ER admission

## 2021-09-24 NOTE — Consult Note (Signed)
NAME: Stacey Stone  DOB: 09/24/1945  MRN: [id number]  Date/Time: 09/24/2021 1:55 PM  REQUESTING PROVIDER: Dr.alexander Subjective:  REASON FOR CONSULT: esbl e.coli bacteremia ? Stacey Stone is a 75 y.o. 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 She had gone to her oncologist's office on 09/23/21 . Vitals normal, UA dirty started on bactrim . She came to the ED because of fever and concern for sepsis BP 181/96, temp 100.4 > 101.2, RR 22, pulse 105   Labs showed wbc < 0.1, Hb 10.9, PLT 90, NA 134, K 3.2, ast 124, ALT 140 and alk po4 277 Blood and urine culture sent- covid test was positive She is on IV antibiotics now meropenem- I am seeing her for ESBL ecoli  bacteremia I Past Medical History:  Diagnosis Date   Abdominal pain, left lower quadrant    epiploic appendagitis   Anginal pain (HCC)    Arthritis 06/06/2018   knee   Asthma    mild   Atrial fibrillation (HCC)    Breast cancer (HCC)    Coronary artery disease 06/06/2018   1 artery 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 P. 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   No pertinent past medical history    Pancytopenia (HCC)    Personal history of radiation therapy    Sleep apnea 06/06/2018   O2- 2l at bedtime and BiPap @ bedtime    Past Surgical History:  Procedure Laterality Date   BREAST BIOPSY Left 2/136/2018   INVASIVE MAMMARY CARCINOMA   BREAST BIOPSY Right 05/25/2016   FIBROCYSTIC CHANGE WITH CALCIFICATIONS    BREAST  BIOPSY Right 05/29/2020   Affirm bx-"X" clip-FIBROADENOMA WITH ASSOCIATED CALCIFICATIONS. - NEGATIVE   BREAST EXCISIONAL BIOPSY Left 06/04/2016   INVASIVE MAMMARY CARCINOMA.    BREAST LUMPECTOMY Left 06/04/2016   INVASIVE MAMMARY CARCINOMA.    CARDIAC CATHETERIZATION     CARDIAC CATHETERIZATION N/A 09/24/2015   Procedure: Left Heart Cath and Coronary Angiography;  Surgeon: Alexander Paraschos, MD;  Location: [facility];  Service: Cardiovascular;  Laterality: N/A;   CARDIAC CATHETERIZATION N/A 09/24/2015   Procedure: Coronary Stent Intervention;  Surgeon: Alexander Paraschos, MD;  Location: [facility];  Service: Cardiovascular;  Laterality: N/A;   CHOLECYSTECTOMY  11/26   08/1970   COLONOSCOPY WITH PROPOFOL N/A 07/08/2021   Procedure: COLONOSCOPY WITH PROPOFOL;  Surgeon: Toledo, Teodoro K, MD;  Location: [facility];  Service: Gastroenterology;  Laterality: N/A;   DILATATION & CURETTAGE/HYSTEROSCOPY WITH MYOSURE N/A 05/05/2015   Procedure: Hysteroscopy and endometrial curretage;  Surgeon: Thomas J Schermerhorn, MD;  Location: [facility];  Service: Gynecology;  Laterality: N/A;   DILATION AND CURETTAGE OF UTERUS     EXCISION MASS FROM NECK  Right    Dr. McQueen   EYE SURGERY  05/29/2020   bil cataract 9/08   HAMMER TOE SURGERY Right    IR BONE MARROW BIOPSY & ASPIRATION  09/09/2021   IR IMAGING GUIDED PORT INSERTION  09/09/2021     JOINT REPLACEMENT Bilateral 2008, 11/26/ 2012   Partial Knee Replacements   LEFT HEART CATH AND CORONARY ANGIOGRAPHY N/A 10/25/2017   Procedure: LEFT HEART CATH AND CORONARY ANGIOGRAPHY;  Surgeon: Teodoro Spray, MD;  Location: [facility];  Service: Cardiovascular;  Laterality: N/A;   LEFT HEART CATH AND CORONARY ANGIOGRAPHY N/A 10/26/2017   Procedure: LEFT HEART CATH AND CORONARY ANGIOGRAPHY;  Surgeon: Isaias Cowman, MD;  Location: [facility];  Service: Cardiovascular;  Laterality: N/A;   PARTIAL MASTECTOMY WITH  NEEDLE LOCALIZATION Left 06/04/2016   Procedure: PARTIAL MASTECTOMY WITH NEEDLE LOCALIZATION;  Surgeon: Leonie Green, MD;  Location: [facility];  Service: General;  Laterality: Left;   SCLERAL BUCKLE  02/16/2011   Procedure: SCLERAL BUCKLE;  Surgeon: Hayden Pedro, MD;  Location: [place];  Service: Ophthalmology;  Laterality: Left;  Scleral Buckle Left Eye with Headscope Laser   SENTINEL NODE BIOPSY Left 06/04/2016   Procedure: SENTINEL NODE BIOPSY;  Surgeon: Leonie Green, MD;  Location: [facility];  Service: General;  Laterality: Left;    Social History   Socioeconomic History   Marital status: Widowed    Spouse name: Not on file   Number of children: Not on file   Years of education: Not on file   Highest education level: Not on file  Occupational History   Not on file  Tobacco Use   Smoking status: Never   Smokeless tobacco: Never  Vaping Use   Vaping Use: Never used  Substance and Sexual Activity   Alcohol use: No   Drug use: No   Sexual activity: Not Currently  Other Topics Concern   Not on file  Social History Narrative   Not on file   Social Determinants of Health   Financial Resource Strain: Not on file  Food Insecurity: Not on file  Transportation Needs: Not on file  Physical Activity: Not on file  Stress: Not on file  Social Connections: Not on file  Intimate Partner Violence: Not on file    Family History  Problem Relation Age of Onset   Breast cancer Mother 41   Heart disease Father    Breast cancer Paternal Aunt 39   Breast cancer Cousin    Anesthesia problems Neg Hx    Hypotension Neg Hx    Malignant hyperthermia Neg Hx    Pseudochol deficiency Neg Hx    Allergies  Allergen Reactions   Codeine Other (See Comments)    Stroke like symptoms   Tramadol Itching   I? Current Facility-Administered Medications  Medication Dose Route Frequency Provider Last Rate Last Admin   acetaminophen (TYLENOL) tablet 650 mg  650 mg Oral Q6H PRN Athena Masse, MD   650 mg at 09/24/21 0762   Or   acetaminophen (TYLENOL) suppository 650 mg  650 mg Rectal Q6H PRN Athena Masse, MD       apixaban Arne Cleveland) tablet 5 mg  5 mg Oral BID Athena Masse, MD   5 mg at 09/24/21 1014   aspirin EC tablet 81 mg  81 mg Oral Daily Athena Masse, MD   81 mg at 09/24/21 1012   atorvastatin (LIPITOR) tablet 40 mg  40 mg Oral QHS Athena Masse, MD   40 mg at 09/23/21 2335   Chlorhexidine Gluconate Cloth 2 % PADS 6 each  6 each Topical Daily Emeterio Reeve, DO       chlorpheniramine-HYDROcodone 10-8 MG/5ML suspension 5 mL  5 mL Oral  Q12H PRN Duncan, Hazel V, MD       famotidine (PEPCID) tablet 40 mg  40 mg Oral QHS Duncan, Hazel V, MD   40 mg at 09/23/21 2335   guaiFENesin-dextromethorphan (ROBITUSSIN DM) 100-10 MG/5ML syrup 10 mL  10 mL Oral Q4H PRN Duncan, Hazel V, MD       isosorbide mononitrate (IMDUR) 24 hr tablet 30 mg  30 mg Oral Daily Duncan, Hazel V, MD   30 mg at 09/24/21 1011   lactated ringers infusion   Intravenous Continuous Robinson, Patrick, MD 150 mL/hr at 09/24/21 0630 New Bag at 09/24/21 0630   letrozole (FEMARA) tablet 2.5 mg  2.5 mg Oral Daily Duncan, Hazel V, MD   2.5 mg at 09/24/21 1012   levothyroxine (SYNTHROID) tablet 125 mcg  125 mcg Oral Q0600 Duncan, Hazel V, MD   125 mcg at 09/24/21 0600   magic mouthwash  5 mL Oral TID PRN Duncan, Hazel V, MD       meropenem (MERREM) 1 g in sodium chloride 0.9 % 100 mL IVPB  1 g Intravenous Q8H Duncan, Hazel V, MD   Stopped at 09/24/21 0630   ondansetron (ZOFRAN) tablet 4 mg  4 mg Oral Q6H PRN Duncan, Hazel V, MD       Or   ondansetron (ZOFRAN) injection 4 mg  4 mg Intravenous Q6H PRN Duncan, Hazel V, MD       remdesivir 100 mg in sodium chloride 0.9 % 100 mL IVPB  100 mg Intravenous Daily Duncan, Hazel V, MD       sertraline (ZOLOFT) tablet 50 mg  50 mg Oral QHS Duncan, Hazel V, MD   50 mg at 09/23/21 2335   sotalol (BETAPACE) tablet 80 mg  80 mg Oral BID Duncan, Hazel V, MD   80 mg at  09/24/21 1013     Abtx:  Anti-infectives (From admission, onward)    Start     Dose/Rate Route Frequency Ordered Stop   09/25/21 2300  vancomycin (VANCOREADY) IVPB 1750 mg/350 mL  Status:  Discontinued        1,750 mg 175 mL/hr over 120 Minutes Intravenous Every 48 hours 09/23/21 2241 09/24/21 0822   09/24/21 2200  vancomycin (VANCOREADY) IVPB 750 mg/150 mL  Status:  Discontinued        750 mg 150 mL/hr over 60 Minutes Intravenous Every 24 hours 09/24/21 0822 09/24/21 0935   09/24/21 1000  remdesivir 100 mg in sodium chloride 0.9 % 100 mL IVPB       See Hyperspace for full Linked Orders Report.   100 mg 200 mL/hr over 30 Minutes Intravenous Daily 09/23/21 2218 09/26/21 0959   09/23/21 2245  meropenem (MERREM) 1 g in sodium chloride 0.9 % 100 mL IVPB        1 g 200 mL/hr over 30 Minutes Intravenous Every 8 hours 09/23/21 2236     09/23/21 2230  remdesivir 200 mg in sodium chloride 0.9% 250 mL IVPB       See Hyperspace for full Linked Orders Report.   200 mg 580 mL/hr over 30 Minutes Intravenous Once 09/23/21 2218 09/24/21 0248   09/23/21 2130  vancomycin (VANCOREADY) IVPB 1750 mg/350 mL        1,750 mg 175 mL/hr over 120 Minutes Intravenous  Once 09/23/21 2116 09/24/21 0041   09/23/21 2115  metroNIDAZOLE (FLAGYL) IVPB 500 mg        500 mg 100 mL/hr over 60 Minutes Intravenous  Once 09/23/21 2114   09/23/21 2314   09/23/21 1845  cefTRIAXone (ROCEPHIN) 2 g in sodium chloride 0.9 % 100 mL IVPB  Status:  Discontinued        2 g 200 mL/hr over 30 Minutes Intravenous Every 24 hours 09/23/21 1841 09/23/21 2237       REVIEW OF SYSTEMS:  Const:  fever,  chills, negative weight loss Eyes: negative diplopia or visual changes, negative eye pain ENT: negative coryza, negative sore throat Resp: negative cough, hemoptysis, dyspnea Cards: negative for chest pain, palpitations, lower extremity edema GU:  frequency, dysuria  GI: Negative for abdominal pain, diarrhea, bleeding,  constipation Skin: negative for rash and pruritus Heme: negative for easy bruising and gum/nose bleeding MS: weakness Neurolor headaches, no dizziness, vertigo, memory problems  Psych: negative for feelings of anxiety, depression  Endocrine:  thyroid issues Allergy/Immunology- as above Objective:  VITALS:  BP 136/78   Pulse 62   Temp 98.3 F (36.8 C) (Oral)   Resp (!) 24   Ht 5' 2" (1.575 m)   Wt 80.2 kg   SpO2 100%   BMI 32.34 kg/m   PHYSICAL EXAM:  General: Alert, cooperative, no distress, appears stated age.  Head: Normocephalic, without obvious abnormality, atraumatic. Eyes: Conjunctivae clear, anicteric sclerae. Pupils are equal ENT Nares normal. No drainage or sinus tenderness. Lips, mucosa, and tongue normal. No Thrush Neck: , symmetrical, extensive adenopathy,  thyroid: non tender no carotid bruit and no JVD. Back: No CVA tenderness. Lungs: b/l air entry. Heart: Regular rate and rhythm, no murmur, rub or gallop. Abdomen: Soft, non-tender,not distended. Bowel sounds normal. No masses Extremities: atraumatic, no cyanosis. No edema. No clubbing Skin: No rashes or lesions. Or bruising Lymph: Cervical, supraclavicular normal. Neurologic: Grossly non-focal Pertinent Labs Lab Results CBC    Component Value Date/Time   WBC <0.1 (LL) 09/24/2021 0554   RBC 4.12 09/24/2021 0554   HGB 9.7 (L) 09/24/2021 0554   HGB 13.3 07/03/2014 1503   HCT 31.1 (L) 09/24/2021 0554   HCT 40.8 07/03/2014 1503   PLT 50 (L) 09/24/2021 0554   PLT 240 07/03/2014 1503   MCV 75.5 (L) 09/24/2021 0554   MCV 80 07/03/2014 1503   MCH 23.5 (L) 09/24/2021 0554   MCHC 31.2 09/24/2021 0554   RDW 15.9 (H) 09/24/2021 0554   RDW 15.3 (H) 07/03/2014 1503   LYMPHSABS 0.0 (L) 09/23/2021 1915   LYMPHSABS 2.8 07/03/2014 1503   MONOABS 0.0 (L) 09/23/2021 1915   MONOABS 0.5 07/03/2014 1503   EOSABS 0.0 09/23/2021 1915   EOSABS 0.3 07/03/2014 1503   BASOSABS 0.0 09/23/2021 1915   BASOSABS 0.1  07/03/2014 1503       Latest Ref Rng & Units 09/24/2021    5:54 AM 09/23/2021    7:15 PM 09/23/2021    2:13 PM  CMP  Glucose 70 - 99 mg/dL 105  100  105   BUN 8 - 23 mg/dL 15  17  20   Creatinine 0.44 - 1.00 mg/dL 0.72  0.68  0.60   Sodium 135 - 145 mmol/L 138  134  133   Potassium 3.5 - 5.1 mmol/L 2.9  3.2  3.9   Chloride 98 - 111 mmol/L 101  97  97   CO2 22 - 32 mmol/L 27  27  28   Calcium 8.9 - 10.3 mg/dL 7.9  8.7  8.6   Total Protein 6.5 - 8.1 g/dL 5.3  6.2  6.2   Total Bilirubin 0.3 - 1.2 mg/dL 2.1  1.5    1.0   Alkaline Phos 38 - 126 U/L 260  277  231   AST 15 - 41 U/L 112  124  115   ALT 0 - 44 U/L 127  140  122       Microbiology: Recent Results (from the past 240 hour(s))  Resp Panel by RT-PCR (Flu A&B, Covid) Anterior Nasal Swab     Status: Abnormal   Collection Time: 09/23/21  7:30 PM   Specimen: Anterior Nasal Swab  Result Value Ref Range Status   SARS Coronavirus 2 by RT PCR POSITIVE (A) NEGATIVE Final    Comment: RESULT CALLED TO, READ BACK BY AND VERIFIED WITH: ASHLEY ORSUTO AT 2057 ON 09/23/21 BY SS (NOTE) SARS-CoV-2 target nucleic acids are DETECTED.  The SARS-CoV-2 RNA is generally detectable in upper respiratory specimens during the acute phase of infection. Positive results are indicative of the presence of the identified virus, but do not rule out bacterial infection or co-infection with other pathogens not detected by the test. Clinical correlation with patient history and other diagnostic information is necessary to determine patient infection status. The expected result is Negative.  Fact Sheet for Patients: https://www.fda.gov/media/152166/download  Fact Sheet for Healthcare Providers: https://www.fda.gov/media/152162/download  This test is not yet approved or cleared by the [place] FDA and  has been authorized for detection and/or diagnosis of SARS-CoV-2 by FDA under an Emergency Use Authorization (EUA).  This EUA will remain in effect  (meaning this test ca n be used) for the duration of  the COVID-19 declaration under Section 564(b)(1) of the Act, 21 U.S.C. section 360bbb-3(b)(1), unless the authorization is terminated or revoked sooner.     Influenza A by PCR NEGATIVE NEGATIVE Final   Influenza B by PCR NEGATIVE NEGATIVE Final    Comment: (NOTE) The Xpert Xpress SARS-CoV-2/FLU/RSV plus assay is intended as an aid in the diagnosis of influenza from Nasopharyngeal swab specimens and should not be used as a sole basis for treatment. Nasal washings and aspirates are unacceptable for Xpert Xpress SARS-CoV-2/FLU/RSV testing.  Fact Sheet for Patients: https://www.fda.gov/media/152166/download  Fact Sheet for Healthcare Providers: https://www.fda.gov/media/152162/download  This test is not yet approved or cleared by the [place] FDA and has been authorized for detection and/or diagnosis of SARS-CoV-2 by FDA under an Emergency Use Authorization (EUA). This EUA will remain in effect (meaning this test can be used) for the duration of the COVID-19 declaration under Section 564(b)(1) of the Act, 21 U.S.C. section 360bbb-3(b)(1), unless the authorization is terminated or revoked.  Performed at [facility], [address] [place], [place] [address]   Blood Culture (routine x 2)     Status: None (Preliminary result)   Collection Time: 09/23/21  7:30 PM   Specimen: BLOOD  Result Value Ref Range Status   Specimen Description BLOOD BLOOD RIGHT HAND  Final   Special Requests   Final    BOTTLES DRAWN AEROBIC AND ANAEROBIC Blood Culture adequate volume   Culture  Setup Time   Final    GRAM NEGATIVE RODS IN BOTH AEROBIC AND ANAEROBIC BOTTLES Organism ID to follow CRITICAL RESULT CALLED TO, READ BACK BY AND VERIFIED WITH: ELVIRA M. PHARMD [id number] 09/24/21 HNM Performed at [facility], [address] [place], [place] [address]    Culture GRAM NEGATIVE RODS  Final   Report Status PENDING  Incomplete   Blood Culture ID Panel (Reflexed)     Status: Abnormal   Collection Time: 09/23/21  7:30 PM  Result Value Ref Range   Status   Enterococcus faecalis NOT DETECTED NOT DETECTED Final   Enterococcus Faecium NOT DETECTED NOT DETECTED Final   Listeria monocytogenes NOT DETECTED NOT DETECTED Final   Staphylococcus species NOT DETECTED NOT DETECTED Final   Staphylococcus aureus (BCID) NOT DETECTED NOT DETECTED Final   Staphylococcus epidermidis NOT DETECTED NOT DETECTED Final   Staphylococcus lugdunensis NOT DETECTED NOT DETECTED Final   Streptococcus species NOT DETECTED NOT DETECTED Final   Streptococcus agalactiae NOT DETECTED NOT DETECTED Final   Streptococcus pneumoniae NOT DETECTED NOT DETECTED Final   Streptococcus pyogenes NOT DETECTED NOT DETECTED Final   A.calcoaceticus-baumannii NOT DETECTED NOT DETECTED Final   Bacteroides fragilis NOT DETECTED NOT DETECTED Final   Enterobacterales DETECTED (A) NOT DETECTED Final    Comment: Enterobacterales represent a large order of gram negative bacteria, not a single organism. CRITICAL RESULT CALLED TO, READ BACK BY AND VERIFIED WITH: ELVIRA M. PHARMD [id number] 09/24/21 HNM    Enterobacter cloacae complex NOT DETECTED NOT DETECTED Final   Escherichia coli DETECTED (A) NOT DETECTED Final    Comment: CRITICAL RESULT CALLED TO, READ BACK BY AND VERIFIED WITH: ELVIRA M. PHARMD [id number] 09/24/21 HNM    Klebsiella aerogenes NOT DETECTED NOT DETECTED Final   Klebsiella oxytoca NOT DETECTED NOT DETECTED Final   Klebsiella pneumoniae NOT DETECTED NOT DETECTED Final   Proteus species NOT DETECTED NOT DETECTED Final   Salmonella species NOT DETECTED NOT DETECTED Final   Serratia marcescens NOT DETECTED NOT DETECTED Final   Haemophilus influenzae NOT DETECTED NOT DETECTED Final   Neisseria meningitidis NOT DETECTED NOT DETECTED Final   Pseudomonas aeruginosa NOT DETECTED NOT DETECTED Final   Stenotrophomonas maltophilia NOT DETECTED NOT DETECTED Final   Candida  albicans NOT DETECTED NOT DETECTED Final   Candida auris NOT DETECTED NOT DETECTED Final   Candida glabrata NOT DETECTED NOT DETECTED Final   Candida krusei NOT DETECTED NOT DETECTED Final   Candida parapsilosis NOT DETECTED NOT DETECTED Final   Candida tropicalis NOT DETECTED NOT DETECTED Final   Cryptococcus neoformans/gattii NOT DETECTED NOT DETECTED Final   CTX-M ESBL DETECTED (A) NOT DETECTED Final    Comment: CRITICAL RESULT CALLED TO, READ BACK BY AND VERIFIED WITH: ELVIRA M. PHARMD [id number] 09/24/21 HNM (NOTE) Extended spectrum beta-lactamase detected. Recommend a carbapenem as initial therapy.      Carbapenem resistance IMP NOT DETECTED NOT DETECTED Final   Carbapenem resistance KPC NOT DETECTED NOT DETECTED Final   Carbapenem resistance NDM NOT DETECTED NOT DETECTED Final   Carbapenem resist OXA 48 LIKE NOT DETECTED NOT DETECTED Final   Carbapenem resistance VIM NOT DETECTED NOT DETECTED Final    Comment: Performed at [facility], [address] [place], [place] [address]  Blood Culture (routine x 2)     Status: None (Preliminary result)   Collection Time: 09/23/21  8:34 PM   Specimen: BLOOD  Result Value Ref Range Status   Specimen Description BLOOD BLOOD RIGHT WRIST  Final   Special Requests   Final    BOTTLES DRAWN AEROBIC AND ANAEROBIC Blood Culture results may not be optimal due to an inadequate volume of blood received in culture bottles   Culture  Setup Time   Final    GRAM NEGATIVE RODS IN BOTH AEROBIC AND ANAEROBIC BOTTLES CRITICAL VALUE NOTED.  VALUE IS CONSISTENT WITH PREVIOUSLY REPORTED AND CALLED VALUE. Performed at [facility], [address], [place], [place] [address]    Culture GRAM NEGATIVE RODS  Final   Report Status PENDING  Incomplete    IMAGING RESULTS:  I have personally reviewed the films ?port [facility]t Hilar adenopathy  Impression/Recommendation ? ?Febrile neutropenia ESBL E.coli bacteremia on Meropenem UTI Need bladder  scan and renal scan  Diffuse B cell lymphoma- started [facility] on 09/17/21  Elevated LFTS could be from lymphoma, chemo  Anemia  Thrombocytopenia  COVID positive but no resp findings- CT value 22- on remdisivir-change to paxlovid or molnuprivir She is fully vaccinated ? __H/o CAD  HTN management as per primary team Afib on sotolol   _________________________________________________ Discussed with patient, requesting provider Note:  This document was prepared using Dragon voice recognition software and may include unintentional dictation errors.  

## 2021-09-24 NOTE — Progress Notes (Signed)
   09/24/21 1500  Clinical Encounter Type  Visited With Patient and family together  Visit Type Initial  Referral From Nurse  Consult/Referral To Chaplain   Chaplain responded to request to provide information for AD and MOST forms.

## 2021-09-25 ENCOUNTER — Inpatient Hospital Stay: Payer: No Typology Code available for payment source

## 2021-09-25 DIAGNOSIS — A419 Sepsis, unspecified organism: Secondary | ICD-10-CM | POA: Diagnosis not present

## 2021-09-25 DIAGNOSIS — C8338 Diffuse large B-cell lymphoma, lymph nodes of multiple sites: Secondary | ICD-10-CM | POA: Diagnosis not present

## 2021-09-25 DIAGNOSIS — I2510 Atherosclerotic heart disease of native coronary artery without angina pectoris: Secondary | ICD-10-CM | POA: Diagnosis not present

## 2021-09-25 DIAGNOSIS — R7989 Other specified abnormal findings of blood chemistry: Secondary | ICD-10-CM | POA: Diagnosis not present

## 2021-09-25 DIAGNOSIS — U071 COVID-19: Secondary | ICD-10-CM | POA: Diagnosis not present

## 2021-09-25 DIAGNOSIS — R7881 Bacteremia: Secondary | ICD-10-CM | POA: Diagnosis not present

## 2021-09-25 DIAGNOSIS — S6010XA Contusion of unspecified finger with damage to nail, initial encounter: Secondary | ICD-10-CM

## 2021-09-25 LAB — BASIC METABOLIC PANEL
Anion gap: 3 — ABNORMAL LOW (ref 5–15)
BUN: 18 mg/dL (ref 8–23)
CO2: 27 mmol/L (ref 22–32)
Calcium: 7.9 mg/dL — ABNORMAL LOW (ref 8.9–10.3)
Chloride: 107 mmol/L (ref 98–111)
Creatinine, Ser: 0.56 mg/dL (ref 0.44–1.00)
GFR, Estimated: >60 mL/min (ref >60)
Glucose, Bld: 100 mg/dL — ABNORMAL HIGH (ref 70–99)
Potassium: 4.1 mmol/L (ref 3.5–5.1)
Sodium: 137 mmol/L (ref 135–145)

## 2021-09-25 LAB — CBC
HCT: 29.3 % — ABNORMAL LOW (ref 36.0–46.0)
Hemoglobin: 9.3 g/dL — ABNORMAL LOW (ref 12.0–15.0)
MCH: 23.5 pg — ABNORMAL LOW (ref 26.0–34.0)
MCHC: 31.7 g/dL (ref 30.0–36.0)
MCV: 74 fL — ABNORMAL LOW (ref 80.0–100.0)
Platelets: 43 10*3/uL — ABNORMAL LOW (ref 150–400)
RBC: 3.96 MIL/uL (ref 3.87–5.11)
RDW: 15.7 % — ABNORMAL HIGH (ref 11.5–15.5)
WBC: 0.2 10*3/uL — CL (ref 4.0–10.5)
nRBC: 0 % (ref 0.0–0.2)

## 2021-09-25 LAB — HEPATIC FUNCTION PANEL
ALT: 86 U/L — ABNORMAL HIGH (ref 0–44)
AST: 53 U/L — ABNORMAL HIGH (ref 15–41)
Albumin: 2.1 g/dL — ABNORMAL LOW (ref 3.5–5.0)
Alkaline Phosphatase: 200 U/L — ABNORMAL HIGH (ref 38–126)
Bilirubin, Direct: 0.5 mg/dL — ABNORMAL HIGH (ref 0.0–0.2)
Indirect Bilirubin: 0.6 mg/dL (ref 0.3–0.9)
Total Bilirubin: 1.1 mg/dL (ref 0.3–1.2)
Total Protein: 5.3 g/dL — ABNORMAL LOW (ref 6.5–8.1)

## 2021-09-25 LAB — LACTATE DEHYDROGENASE: LDH: 130 U/L (ref 98–192)

## 2021-09-25 MED ORDER — SODIUM CHLORIDE 0.9 % IV SOLN
100.0000 mg | Freq: Every day | INTRAVENOUS | Status: AC
  Administered 2021-09-26 – 2021-09-27 (×2): 100 mg via INTRAVENOUS
  Filled 2021-09-25 (×2): qty 20

## 2021-09-25 MED ORDER — CALCIUM CARBONATE ANTACID 500 MG PO CHEW
400.0000 mg | CHEWABLE_TABLET | Freq: Three times a day (TID) | ORAL | Status: DC | PRN
Start: 2021-09-25 — End: 2021-09-28
  Administered 2021-09-26: 400 mg via ORAL
  Filled 2021-09-25: qty 2

## 2021-09-25 MED ORDER — SILVER NITRATE-POT NITRATE 75-25 % EX MISC
5.0000 | Freq: Once | CUTANEOUS | Status: DC
Start: 2021-09-25 — End: 2021-09-28
  Filled 2021-09-25: qty 5

## 2021-09-25 NOTE — Progress Notes (Signed)
PROGRESS NOTE    Stacey Stone  SWF:[id number] DOB: 25-Jun-1945  DOA: 09/23/2021 Date of Service: 09/25/21 PCP: Stacey Aus, MD     Brief Narrative / Hospital Course:  Stacey Stone is a 76 y.o. 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 She was sent to ED 09/23/2021 from the cancer center with generalized malaise, weakness, lethargy and a fever over the past 24 hours. She was given a dose of Rocephin for suspected UTI.  No culture results available. Mild cough.  Denies nausea, vomiting or abdominal pain.  07/05: ED course and early admission: Tmax 101.2, pulse 105, respirations 22 with BP 181/96 and O2 sat 97% on room air. Labs with WBC less than 0.1 with neutrophil percent 29%, hemoglobin 10 and platelets 90,000.  Lactic acid 2.4.  Potassium 3.2.  LFTs elevated with AST 124, ALT 140, alk phos 277 and total bili 1.5.  COVID PCR(+), UA c/w UTI. CXR no concerns. Patient treated with Rocephin, vancomycin and Flagyl as well as azithromycin, started on sepsis fluids and hospitalist consulted for admission for sepsis d/t UTI, COVID, other potential source d/t immune compromise, also neutropenic fever likely d/t above Added remdesevir.  07/06: continuing anti-infectives (meropenem, vancomycin, remdesevir), (+)blood cultures ESBL, consulted ID. Hyponatremia resolved. Hypokalemia repleted. AST/ALT trending down but still elevated. Lactate trending down. Still meeting sepsis criteria d/w febrile early this morning (improved), tachycardia (improved), tachypnea, low WBC. 07/07: VS and WBC improved, not meeting sepsis criteria though WBC still quite low. Bleeding from R middle finger nail - she bent this back w/ fall at home. Pressure bandage applied, unable to apply silver  nitrite, will check w/ ED re: what clotting bandage they used which has holding up okay. ID following, remains on abx for now. Renal US pending.   Consultants:  ID  Procedures: none    Subjective: Patient reports feeling better today, no CP/SOB. Finger is hurting, see above re: bandage applied.       ASSESSMENT & PLAN:   Principal Problem:   Sepsis ([place]) Active Problems:   Neutropenic fever ([place])   Urinary tract infection   COVID-19 virus infection   Abnormal LFTs   DLBCL (diffuse large B cell lymphoma) (HCC)   Pancytopenia, chemotherapy induced(HCC)   Coronary artery disease   Essential hypertension   Hypothyroidism   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HCC)   OSA on CPAP   Paroxysmal atrial fibrillation (HCC)   History of breast cancer   Bacteremia   Finger nail contusion, initial encounter   Sepsis ([place]) Sepsis criteria includes tachycardia, tachypnea and fever with infection and lactic acidosis Septic sources include UTI, COVID, as well as other sources in view of immunosuppression from chemotherapy Sepsis fluid resuscitation Antibiotics/antiinfectives for UTI, COVID< ESBL Ecoli bacteremia  ID consult given bacteremia   Neutropenic fever ([place]) ANC of 29 based on WBC less than 0.1 and neutrophil percent 29% Likely source UTI and patient also COVID-positive pending further ID recommendations  Urinary tract infection Meropenem as above Follow cultures ID following given bacteremia  COVID-19 virus infection Suspecting incidental COVID as patien symptomatic only for mild cough and chest x-ray clear Airborne precautions and neutropenic precautions Remdesivir, albuterol, antitussives ID consult for additional recommendations  Pancytopenia, chemotherapy induced(HCC) Monitor cell lines If worsening may need to consider Oncology consult     DLBCL (  diffuse large B cell lymphoma) ([place]) Started on first cycle chemo on 6/29  Coronary artery disease No complaints of chest  pain Continue aspirin, atorvastatin, isosorbide, sotalol  nitroglycerin as needed  Essential hypertension Was mildly elevated on arrival, now controlled Continue sotalol, amlodipine Monitor for hypotension in view of sepsis diagnosis  Hypothyroidism Continue levothyroxine  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[place]) Diagnosed in 2017 followed by oncology  OSA on CPAP CPAP nightly  Paroxysmal atrial fibrillation (HCC) Continue sotalol and apixaban.   Monitor for bleeding  Abnormal LFTs LFTs elevated with AST 124, ALT 140, alk phos 277 and total bili 1.5 Patient had prior cholecystectomy Follow-up hepatitis panel  right upper quadrant ultrasound   History of breast cancer Finishing up letrozole this month.  [name]  Finger nail contusion, initial encounter L 3rd digit, nail bent back fall at home, wont't stop oozing now that clotting bandage has worn off Pressure bandage Will try to locate finger splint / call ortho tech to place this Need clotting powder and will re-dress D/c Eliquis given this bleed and low Plt, placed SCD      DVT prophylaxis: Apixiban (on this at home for PAfib) stopped d/t bleeding, on SCD now  Code Status: DNR Family Communication: will call daughter today  Disposition Plan / TOC needs: anticipate d/c back tohome environment, may need HH, possibly SNF pending PT/OT eval once more stable  Barriers to discharge / significant pending items: actively treating sepsis w/ multiple possible sources, bacteremia may require IV abx on eventual discharge, neutropenic fever, see A/P              Objective: Vitals:   09/24/21 1600 09/24/21 1933 09/25/21 0536 09/25/21 0855  BP: 133/75 (!) 151/86 (!) 141/80 (!) 158/83  Pulse: (!) 58 71 68 69  Resp: 19 20 18 18   Temp: 97.8 F (36.6 C) 98.3 F (36.8 C) 98.2 F (36.8 C) 98.2 F (36.8 C)  TempSrc:    Oral  SpO2: 100% 100% 98% 98%  Weight:      Height:        Intake/Output Summary (Last 24 hours) at  09/25/2021 1431 Last data filed at 09/25/2021 0945 Gross per 24 hour  Intake 2200 ml  Output 200 ml  Net 2000 ml  Net IO Since Admission: 3,126.86 mL [09/25/21 1431]  Filed Weights   09/23/21 1821  Weight: 80.2 kg    Examination:  Constitutional:  VS as above General Appearance: alert, well-developed, well-nourished, NAD Ears, Nose, Mouth, Throat: Normal appearance MMM, posterior pharynx without erythema/exudate Neck: No masses, trachea midline Respiratory: Normal respiratory effort Breath sounds normal except very slight crackles at bases c/w atelectasis  Cardiovascular: S1/S2 normal, no murmur/rub/gallop auscultated No lower extremity edema Gastrointestinal: Nontender, no masses No hernia appreciated Musculoskeletal:  No clubbing/cyanosis of digits L 3rd digit bleeding under finger nail, unable to stop w/ silver nitrate, bandage applied, see above  Neurological: No cranial nerve deficit on limited exam Psychiatric: Normal judgment/insight Normal mood and affect       Scheduled Medications:   amLODipine  5 mg Oral - HELD TODAY 09/25/21 D/T SOFT BP  Daily   apixaban  5 mg Oral BID   aspirin EC  81 mg Oral Daily   atorvastatin  40 mg Oral QHS   famotidine  40 mg Oral QHS   isosorbide mononitrate  30 mg Oral Daily   letrozole  2.5 mg Oral Daily   levothyroxine  125 mcg Oral Q0600   sertraline  50 mg Oral QHS   sotalol  80 mg Oral BID    Continuous Infusions:  meropenem (MERREM) IV 1 g (09/25/21 1419)   [START ON 09/26/2021] remdesivir 100 mg in sodium chloride 0.9 % 100 mL IVPB      PRN Medications:  acetaminophen **OR** acetaminophen, chlorpheniramine-HYDROcodone, guaiFENesin-dextromethorphan, magic mouthwash, ondansetron **OR** ondansetron (ZOFRAN) IV  Antimicrobials:  Anti-infectives (From admission, onward)    Start     Dose/Rate Route Frequency Ordered Stop   09/26/21 1000  remdesivir 100 mg in sodium chloride 0.9 % 100 mL IVPB        100 mg 200  mL/hr over 30 Minutes Intravenous Daily 09/25/21 1358 09/28/21 0959   09/25/21 2300  vancomycin (VANCOREADY) IVPB 1750 mg/350 mL  Status:  Discontinued        1,750 mg 175 mL/hr over 120 Minutes Intravenous Every 48 hours 09/23/21 2241 09/24/21 0822   09/24/21 2200  vancomycin (VANCOREADY) IVPB 750 mg/150 mL  Status:  Discontinued        750 mg 150 mL/hr over 60 Minutes Intravenous Every 24 hours 09/24/21 0822 09/24/21 0935   09/24/21 1000  remdesivir 100 mg in sodium chloride 0.9 % 100 mL IVPB       See Hyperspace for full Linked Orders Report.   100 mg 200 mL/hr over 30 Minutes Intravenous Daily 09/23/21 2218 09/25/21 0939   09/23/21 2245  meropenem (MERREM) 1 g in sodium chloride 0.9 % 100 mL IVPB        1 g 200 mL/hr over 30 Minutes Intravenous Every 8 hours 09/23/21 2236     09/23/21 2230  remdesivir 200 mg in sodium chloride 0.9% 250 mL IVPB       See Hyperspace for full Linked Orders Report.   200 mg 580 mL/hr over 30 Minutes Intravenous Once 09/23/21 2218 09/24/21 0248   09/23/21 2130  vancomycin (VANCOREADY) IVPB 1750 mg/350 mL        1,750 mg 175 mL/hr over 120 Minutes Intravenous  Once 09/23/21 2116 09/24/21 0041   09/23/21 2115  metroNIDAZOLE (FLAGYL) IVPB 500 mg        500 mg 100 mL/hr over 60 Minutes Intravenous  Once 09/23/21 2114 09/23/21 2314   09/23/21 1845  cefTRIAXone (ROCEPHIN) 2 g in sodium chloride 0.9 % 100 mL IVPB  Status:  Discontinued        2 g 200 mL/hr over 30 Minutes Intravenous Every 24 hours 09/23/21 1841 09/23/21 2237       Data Reviewed: I have personally reviewed following labs and imaging studies  CBC: Recent Labs  Lab 09/23/21 1413 09/23/21 1915 09/24/21 0554 09/25/21 0404  WBC 0.7* <0.1* <0.1* 0.2*  NEUTROABS 0.3* 0.0*  --   --   HGB 10.6* 10.9* 9.7* 9.3*  HCT 33.3* 34.4* 31.1* 29.3*  MCV 75.9* 75.1* 75.5* 74.0*  PLT 119* 90* 50* 43*   Basic Metabolic Panel: Recent Labs  Lab 09/23/21 1413 09/23/21 1915 09/24/21 0554  09/25/21 0404  NA 133* 134* 138 137  K 3.9 3.2* 2.9* 4.1  CL 97* 97* 101 107  CO2 28 27 27 27   GLUCOSE 105* 100* 105* 100*  BUN 20 17 15 18   CREATININE 0.60 0.68 0.72 0.56  CALCIUM 8.6* 8.7* 7.9* 7.9*   GFR: Estimated Creatinine Clearance: 57.7 mL/min (by C-G formula based on SCr of 0.56 mg/dL). Liver Function Tests: Recent Labs  Lab 09/23/21 1413 09/23/21 1915 09/24/21 0554  AST 115* 124* 112*  ALT 122* 140*  127*  ALKPHOS 231* 277* 260*  BILITOT 1.0 1.5* 2.1*  PROT 6.2* 6.2* 5.3*  ALBUMIN 2.6* 2.8* 2.4*   No results for input(s): "LIPASE", "AMYLASE" in the last 168 hours. No results for input(s): "AMMONIA" in the last 168 hours. Coagulation Profile: Recent Labs  Lab 09/23/21 1915  INR 1.1   Cardiac Enzymes: No results for input(s): "CKTOTAL", "CKMB", "CKMBINDEX", "TROPONINI" in the last 168 hours. BNP (last 3 results) No results for input(s): "PROBNP" in the last 8760 hours. HbA1C: No results for input(s): "HGBA1C" in the last 72 hours. CBG: No results for input(s): "GLUCAP" in the last 168 hours. Lipid Profile: No results for input(s): "CHOL", "HDL", "LDLCALC", "TRIG", "CHOLHDL", "LDLDIRECT" in the last 72 hours. Thyroid Function Tests: No results for input(s): "TSH", "T4TOTAL", "FREET4", "T3FREE", "THYROIDAB" in the last 72 hours. Anemia Panel: No results for input(s): "VITAMINB12", "FOLATE", "FERRITIN", "TIBC", "IRON", "RETICCTPCT" in the last 72 hours. Urine analysis:    Component Value Date/Time   COLORURINE YELLOW (A) 09/23/2021 1915   APPEARANCEUR CLOUDY (A) 09/23/2021 1915   LABSPEC 1.008 09/23/2021 1915   PHURINE 7.0 09/23/2021 1915   GLUCOSEU NEGATIVE 09/23/2021 1915   HGBUR LARGE (A) 09/23/2021 1915   BILIRUBINUR NEGATIVE 09/23/2021 [place] 09/23/2021 1915   PROTEINUR NEGATIVE 09/23/2021 1915   NITRITE NEGATIVE 09/23/2021 1915   LEUKOCYTESUR LARGE (A) 09/23/2021 1915   Sepsis  Labs: @LABRCNTIP (procalcitonin:4,lacticidven:4)  Recent Results (from the past 240 hour(s))  Urine culture     Status: Abnormal (Preliminary result)   Collection Time: 09/23/21  2:55 PM   Specimen: Urine, Random  Result Value Ref Range Status   Specimen Description   Final    URINE, RANDOM Performed at [facility], [address] [place], [place] [address]    Special Requests   Final    NONE Performed at [facility], [address] [place], [place] [address]    Culture (A)  Final    >=100,000 COLONIES/mL ESCHERICHIA COLI SUSCEPTIBILITIES TO FOLLOW Performed at [facility], [place] [address], [place], [place] [address]    Report Status PENDING  Incomplete  Urine Culture     Status: Abnormal (Preliminary result)   Collection Time: 09/23/21  7:15 PM   Specimen: In/Out Cath Urine  Result Value Ref Range Status   Specimen Description   Final    IN/OUT CATH URINE Performed at [facility], [address], [place], [place] [address]    Special Requests   Final    NONE Performed at [facility], [address] [place], [place] [address]    Culture (A)  Final    >=100,000 COLONIES/mL ESCHERICHIA COLI SUSCEPTIBILITIES TO FOLLOW Performed at [facility], [place] [address], [place], [place] [address]    Report Status PENDING  Incomplete  Resp Panel by RT-PCR (Flu A&B, Covid) Anterior Nasal Swab     Status: Abnormal   Collection Time: 09/23/21  7:30 PM   Specimen: Anterior Nasal Swab  Result Value Ref Range Status   SARS Coronavirus 2 by RT PCR POSITIVE (A) NEGATIVE Final    Comment: RESULT CALLED TO, READ BACK BY AND VERIFIED WITH: ASHLEY ORSUTO AT 2057 ON 09/23/21 BY SS (NOTE) SARS-CoV-2 target nucleic acids are DETECTED.  The SARS-CoV-2 RNA is generally detectable in upper respiratory specimens during the acute phase of infection. Positive results are indicative of the presence of the identified virus, but do not rule out  bacterial infection or co-infection  with other pathogens not detected by the test. Clinical correlation with patient history and other diagnostic information is necessary to determine patient infection status. The expected result is Negative.  Fact Sheet for Patients: EntrepreneurPulse.com.au  Fact Sheet for Healthcare Providers: IncredibleEmployment.be  This test is not yet approved or cleared by the [place] FDA and  has been authorized for detection and/or diagnosis of SARS-CoV-2 by FDA under an Emergency Use Authorization (EUA).  This EUA will remain in effect (meaning this test ca n be used) for the duration of  the COVID-19 declaration under Section 564(b)(1) of the Act, 21 U.S.C. section 360bbb-3(b)(1), unless the authorization is terminated or revoked sooner.     Influenza A by PCR NEGATIVE NEGATIVE Final   Influenza B by PCR NEGATIVE NEGATIVE Final    Comment: (NOTE) The Xpert Xpress SARS-CoV-2/FLU/RSV plus assay is intended as an aid in the diagnosis of influenza from Nasopharyngeal swab specimens and should not be used as a sole basis for treatment. Nasal washings and aspirates are unacceptable for Xpert Xpress SARS-CoV-2/FLU/RSV testing.  Fact Sheet for Patients: EntrepreneurPulse.com.au  Fact Sheet for Healthcare Providers: IncredibleEmployment.be  This test is not yet approved or cleared by the [place] FDA and has been authorized for detection and/or diagnosis of SARS-CoV-2 by FDA under an Emergency Use Authorization (EUA). This EUA will remain in effect (meaning this test can be used) for the duration of the COVID-19 declaration under Section 564(b)(1) of the Act, 21 U.S.C. section 360bbb-3(b)(1), unless the authorization is terminated or revoked.  Performed at [facility], [address] [place], [place] [address]   Blood Culture (routine x 2)     Status: Abnormal  (Preliminary result)   Collection Time: 09/23/21  7:30 PM   Specimen: BLOOD  Result Value Ref Range Status   Specimen Description   Final    BLOOD BLOOD RIGHT HAND Performed at [facility], [address] [place], [place] [address]    Special Requests   Final    BOTTLES DRAWN AEROBIC AND ANAEROBIC Blood Culture adequate volume Performed at [facility], [address], [place], [place] [address]    Culture  Setup Time   Final    GRAM NEGATIVE RODS IN BOTH AEROBIC AND ANAEROBIC BOTTLES Organism ID to follow CRITICAL RESULT CALLED TO, READ BACK BY AND VERIFIED WITH: Ricci Barker M. PHARMD [id number] 09/24/21 HNM GRAM STAIN REVIEWED-AGREE WITH RESULT    Culture (A)  Final    ESCHERICHIA COLI SUSCEPTIBILITIES TO FOLLOW Performed at [facility], [place] [address], [place], [place] [address]    Report Status PENDING  Incomplete  Blood Culture ID Panel (Reflexed)     Status: Abnormal   Collection Time: 09/23/21  7:30 PM  Result Value Ref Range Status   Enterococcus faecalis NOT DETECTED NOT DETECTED Final   Enterococcus Faecium NOT DETECTED NOT DETECTED Final   Listeria monocytogenes NOT DETECTED NOT DETECTED Final   Staphylococcus species NOT DETECTED NOT DETECTED Final   Staphylococcus aureus (BCID) NOT DETECTED NOT DETECTED Final   Staphylococcus epidermidis NOT DETECTED NOT DETECTED Final   Staphylococcus lugdunensis NOT DETECTED NOT DETECTED Final   Streptococcus species NOT DETECTED NOT DETECTED Final   Streptococcus agalactiae NOT DETECTED NOT DETECTED Final   Streptococcus pneumoniae NOT DETECTED NOT DETECTED Final   Streptococcus pyogenes NOT DETECTED NOT DETECTED Final   A.calcoaceticus-baumannii NOT DETECTED NOT DETECTED Final   Bacteroides fragilis NOT DETECTED NOT DETECTED Final   Enterobacterales DETECTED (A) NOT  DETECTED Final    Comment: Enterobacterales represent a large order of gram negative bacteria, not a single organism. CRITICAL RESULT CALLED TO,  READ BACK BY AND VERIFIED WITH: ELVIRA M. PHARMD [id number] 09/24/21 HNM    Enterobacter cloacae complex NOT DETECTED NOT DETECTED Final   Escherichia coli DETECTED (A) NOT DETECTED Final    Comment: CRITICAL RESULT CALLED TO, READ BACK BY AND VERIFIED WITH: ELVIRA M. PHARMD [id number] 09/24/21 HNM    Klebsiella aerogenes NOT DETECTED NOT DETECTED Final   Klebsiella oxytoca NOT DETECTED NOT DETECTED Final   Klebsiella pneumoniae NOT DETECTED NOT DETECTED Final   Proteus species NOT DETECTED NOT DETECTED Final   Salmonella species NOT DETECTED NOT DETECTED Final   Serratia marcescens NOT DETECTED NOT DETECTED Final   Haemophilus influenzae NOT DETECTED NOT DETECTED Final   Neisseria meningitidis NOT DETECTED NOT DETECTED Final   Pseudomonas aeruginosa NOT DETECTED NOT DETECTED Final   Stenotrophomonas maltophilia NOT DETECTED NOT DETECTED Final   Candida albicans NOT DETECTED NOT DETECTED Final   Candida auris NOT DETECTED NOT DETECTED Final   Candida glabrata NOT DETECTED NOT DETECTED Final   Candida krusei NOT DETECTED NOT DETECTED Final   Candida parapsilosis NOT DETECTED NOT DETECTED Final   Candida tropicalis NOT DETECTED NOT DETECTED Final   Cryptococcus neoformans/gattii NOT DETECTED NOT DETECTED Final   CTX-M ESBL DETECTED (A) NOT DETECTED Final    Comment: CRITICAL RESULT CALLED TO, READ BACK BY AND VERIFIED WITH: ELVIRA M. PHARMD [id number] 09/24/21 HNM (NOTE) Extended spectrum beta-lactamase detected. Recommend a carbapenem as initial therapy.      Carbapenem resistance IMP NOT DETECTED NOT DETECTED Final   Carbapenem resistance KPC NOT DETECTED NOT DETECTED Final   Carbapenem resistance NDM NOT DETECTED NOT DETECTED Final   Carbapenem resist OXA 48 LIKE NOT DETECTED NOT DETECTED Final   Carbapenem resistance VIM NOT DETECTED NOT DETECTED Final    Comment: Performed at [facility], [address] [place], [place] [address]  Blood Culture (routine x 2)     Status: None  (Preliminary result)   Collection Time: 09/23/21  8:34 PM   Specimen: BLOOD  Result Value Ref Range Status   Specimen Description   Final    BLOOD BLOOD RIGHT WRIST Performed at [facility], [address] [place], [place] [address]    Special Requests   Final    BOTTLES DRAWN AEROBIC AND ANAEROBIC Blood Culture results may not be optimal due to an inadequate volume of blood received in culture bottles Performed at [facility], [address], [place], [place] [address]    Culture  Setup Time   Final    GRAM NEGATIVE RODS IN BOTH AEROBIC AND ANAEROBIC BOTTLES CRITICAL VALUE NOTED.  VALUE IS CONSISTENT WITH PREVIOUSLY REPORTED AND CALLED VALUE. Performed at [facility], [address] [place], [place] [address]    Culture   Final    Lonell Grandchild NEGATIVE RODS IDENTIFICATION TO FOLLOW Performed at [facility], [place] [address] [place], [place] [address]    Report Status PENDING  Incomplete         Radiology Studies last 96 hours: US Abdomen Limited RUQ (LIVER/GB)  Result Date: 09/23/2021 CLINICAL DATA:  Elevated LFTs EXAM: ULTRASOUND ABDOMEN LIMITED RIGHT UPPER QUADRANT COMPARISON:  01/06/2021 FINDINGS: Gallbladder: Surgically removed Common bile duct: Diameter: 5.4 mm. Liver: No focal lesion identified. Within normal limits in parenchymal echogenicity. 3 mm calcification is noted in the right lobe of the liver  stable from prior CT. Portal vein is patent on color Doppler imaging with normal direction of blood flow towards the liver. Other: None. IMPRESSION: Status post cholecystectomy. No acute abnormality to correspond with the given clinical history is noted. Electronically Signed   By: Inez Catalina M.D.   On: 09/23/2021 23:30   DG Chest Port 1 View  Result Date: 09/23/2021 CLINICAL DATA:  A 76 year old female presents with questionable sepsis. EXAM: PORTABLE CHEST 1 VIEW COMPARISON:  September 07, 2021. FINDINGS: EKG leads project over the chest.  RIGHT-sided Port-A-Cath terminates at the caval to atrial junction. Cardiomediastinal contours and hilar structures are stable. Lungs are clear. No visible pneumothorax. On limited assessment there is no acute skeletal finding. IMPRESSION: 1. No active disease. 2. RIGHT-sided Port-A-Cath terminates at the caval to atrial junction. 3. Known adenopathy in the chest and lower neck better seen on prior PET imaging Electronically Signed   By: Zetta Bills M.D.   On: 09/23/2021 19:49            LOS: 2 days    Time spent: 50 min    Emeterio Reeve, DO [facility] 09/25/2021, 2:31 PM   Staff may message me via secure chat in [place]  but this may not receive immediate response,  please page for urgent matters!  If 7PM-7AM, please contact night-coverage [email or web address]  Dictation software was used to generate the above note. Typos may occur and escape review, as with typed/written notes. Please contact Dr Sheppard Coil directly for clarity if needed.

## 2021-09-25 NOTE — Progress Notes (Signed)
   09/25/21 1930  Mobility  HOB Elevated/Bed Position Self regulated  Activity Ambulated independently in room  Range of Motion/Exercises Active;All extremities  Level of Assistance Independent  Assistive Device [facility]  Distance Ambulated (ft) 20 ft  Activity Response Tolerated well

## 2021-09-25 NOTE — Assessment & Plan Note (Signed)
L 3rd digit, nail bent back fall at home, wont't stop oozing now that clotting bandage has worn off  Pressure bandage  Will try to locate finger splint / call ortho tech to place this  Need clotting powder and will re-dress  D/c Eliquis given this bleed and low Plt, placed SCD

## 2021-09-25 NOTE — Progress Notes (Signed)
Date of Admission:  09/23/2021     ID: Stacey Stone is a 76 y.o. female  Principal Problem:   Sepsis ([place]) Active Problems:   Coronary artery disease   Essential hypertension   Hypothyroidism   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HCC)   OSA on CPAP   Paroxysmal atrial fibrillation (HCC)   DLBCL (diffuse large B cell lymphoma) (HCC)   Neutropenic fever (HCC)   Pancytopenia, chemotherapy induced(HCC)   Urinary tract infection   COVID-19 virus infection   Abnormal LFTs   History of breast cancer   Bacteremia    Subjective: Pt is feeling a little better Some cough and dyspnea on exertion like walking in the room which is her baseline for the past 2 months  Medications:   apixaban  5 mg Oral BID   aspirin EC  81 mg Oral Daily   atorvastatin  40 mg Oral QHS   Chlorhexidine Gluconate Cloth  6 each Topical Daily   famotidine  40 mg Oral QHS   isosorbide mononitrate  30 mg Oral Daily   letrozole  2.5 mg Oral Daily   levothyroxine  125 mcg Oral Q0600   sertraline  50 mg Oral QHS   sotalol  80 mg Oral BID    Objective: Vital signs in last 24 hours: Temp:  [97.8 F (36.6 C)-98.3 F (36.8 C)] 98.2 F (36.8 C) (07/07 0855) Pulse Rate:  [58-71] 69 (07/07 0855) Resp:  [18-20] 18 (07/07 0855) BP: (133-158)/(75-86) 158/83 (07/07 0855) SpO2:  [98 %-100 %] 98 % (07/07 0855)    PHYSICAL EXAM:  General: Alert, cooperative, no distress,  Neurologic: Grossly non-focal  Lab Results Recent Labs    09/24/21 0554 09/25/21 0404  WBC <0.1* 0.2*  HGB 9.7* 9.3*  HCT 31.1* 29.3*  NA 138 137  K 2.9* 4.1  CL 101 107  CO2 27 27  BUN 15 18  CREATININE 0.72 0.56   Liver Panel Recent Labs    09/23/21 1915 09/24/21 0554  PROT 6.2* 5.3*  ALBUMIN 2.8* 2.4*  AST 124* 112*  ALT 140* 127*  ALKPHOS 277* 260*  BILITOT 1.5* 2.1*    Microbiology: BC- esbl E.coli UC- ESBl e.coli Studies/Results: US Abdomen Limited RUQ (LIVER/GB)  Result Date: 09/23/2021 CLINICAL DATA:  Elevated LFTs  EXAM: ULTRASOUND ABDOMEN LIMITED RIGHT UPPER QUADRANT COMPARISON:  01/06/2021 FINDINGS: Gallbladder: Surgically removed Common bile duct: Diameter: 5.4 mm. Liver: No focal lesion identified. Within normal limits in parenchymal echogenicity. 3 mm calcification is noted in the right lobe of the liver stable from prior CT. Portal vein is patent on color Doppler imaging with normal direction of blood flow towards the liver. Other: None. IMPRESSION: Status post cholecystectomy. No acute abnormality to correspond with the given clinical history is noted. Electronically Signed   By: Inez Catalina M.D.   On: 09/23/2021 23:30   DG Chest Port 1 View  Result Date: 09/23/2021 CLINICAL DATA:  A 76 year old female presents with questionable sepsis. EXAM: PORTABLE CHEST 1 VIEW COMPARISON:  September 07, 2021. FINDINGS: EKG leads project over the chest. RIGHT-sided Port-A-Cath terminates at the caval to atrial junction. Cardiomediastinal contours and hilar structures are stable. Lungs are clear. No visible pneumothorax. On limited assessment there is no acute skeletal finding. IMPRESSION: 1. No active disease. 2. RIGHT-sided Port-A-Cath terminates at the caval to atrial junction. 3. Known adenopathy in the chest and lower neck better seen on prior PET imaging Electronically Signed   By: Zetta Bills M.D.   On: 09/23/2021  19:49     Assessment/Plan:  ESBl e.coli bacteremia ESBL E.coli UTI Ordered renal ultrasound to look for hydro  Bladder scan for post void measurement  COVID illness- ct value 22 Minimal symptoms on Remdisivr X 5 days Potential to get worse because of recent Chemo with R-CHOP and rituximab can make covid worse  Diffuse B cell lymphoma- received 1st dose of chemo R -CHOP on 09/17/21  Elevated LFTS - neg hep scan- could be due to lymphoma involving portahepatis nodes, could involve the liver as well  Chemo could be contributing as well  Febrikle neutropenia- fever resolved Pancytopenia due to  chemo  CAD  HTN management as per primary team  ID will follow her peripherally this weekend- call if needed

## 2021-09-26 DIAGNOSIS — A419 Sepsis, unspecified organism: Secondary | ICD-10-CM | POA: Diagnosis not present

## 2021-09-26 DIAGNOSIS — I2510 Atherosclerotic heart disease of native coronary artery without angina pectoris: Secondary | ICD-10-CM | POA: Diagnosis not present

## 2021-09-26 DIAGNOSIS — R7989 Other specified abnormal findings of blood chemistry: Secondary | ICD-10-CM | POA: Diagnosis not present

## 2021-09-26 DIAGNOSIS — R7881 Bacteremia: Secondary | ICD-10-CM | POA: Diagnosis not present

## 2021-09-26 DIAGNOSIS — Z1612 Extended spectrum beta lactamase (ESBL) resistance: Secondary | ICD-10-CM

## 2021-09-26 DIAGNOSIS — A498 Other bacterial infections of unspecified site: Secondary | ICD-10-CM

## 2021-09-26 DIAGNOSIS — S6010XA Contusion of unspecified finger with damage to nail, initial encounter: Secondary | ICD-10-CM

## 2021-09-26 LAB — BASIC METABOLIC PANEL
Anion gap: 6 (ref 5–15)
BUN: 12 mg/dL (ref 8–23)
CO2: 25 mmol/L (ref 22–32)
Calcium: 8.3 mg/dL — ABNORMAL LOW (ref 8.9–10.3)
Chloride: 102 mmol/L (ref 98–111)
Creatinine, Ser: 0.51 mg/dL (ref 0.44–1.00)
GFR, Estimated: >60 mL/min (ref >60)
Glucose, Bld: 111 mg/dL — ABNORMAL HIGH (ref 70–99)
Potassium: 3.4 mmol/L — ABNORMAL LOW (ref 3.5–5.1)
Sodium: 133 mmol/L — ABNORMAL LOW (ref 135–145)

## 2021-09-26 LAB — CULTURE, BLOOD (ROUTINE X 2): Special Requests: ADEQUATE

## 2021-09-26 LAB — CBC
HCT: 28.5 % — ABNORMAL LOW (ref 36.0–46.0)
Hemoglobin: 9 g/dL — ABNORMAL LOW (ref 12.0–15.0)
MCH: 23.5 pg — ABNORMAL LOW (ref 26.0–34.0)
MCHC: 31.6 g/dL (ref 30.0–36.0)
MCV: 74.4 fL — ABNORMAL LOW (ref 80.0–100.0)
Platelets: 43 10*3/uL — ABNORMAL LOW (ref 150–400)
RBC: 3.83 MIL/uL — ABNORMAL LOW (ref 3.87–5.11)
RDW: 16.2 % — ABNORMAL HIGH (ref 11.5–15.5)
WBC: 0.2 10*3/uL — CL (ref 4.0–10.5)
nRBC: 0 % (ref 0.0–0.2)

## 2021-09-26 LAB — URINE CULTURE
Culture: >=100000 — AB
Culture: >=100000 — AB

## 2021-09-26 LAB — MAGNESIUM: Magnesium: 2 mg/dL (ref 1.7–2.4)

## 2021-09-26 MED ORDER — POLYETHYLENE GLYCOL 3350 17 G PO PACK
17.0000 g | PACK | Freq: Two times a day (BID) | ORAL | Status: DC | PRN
Start: 2021-09-26 — End: 2021-09-28
  Administered 2021-09-26 – 2021-09-27 (×2): 17 g via ORAL
  Filled 2021-09-26 (×2): qty 1

## 2021-09-26 MED ORDER — POTASSIUM CHLORIDE CRYS ER 20 MEQ PO TBCR
40.0000 meq | EXTENDED_RELEASE_TABLET | Freq: Once | ORAL | Status: AC
  Administered 2021-09-26: 40 meq via ORAL
  Filled 2021-09-26: qty 2

## 2021-09-26 NOTE — Assessment & Plan Note (Addendum)
ESBL E.coli UTI  Renal US: No evidence of hydronephrosis or nephrolithiasis. Benign left renal cyst  Bladder scan for post void measurement  Abx as above pending repeat culture, should have results tomorrow 07/10  ID following

## 2021-09-26 NOTE — Progress Notes (Signed)
PROGRESS NOTE    SARI COGAN  LYY:[id number] DOB: Oct 17, 1945  DOA: 09/23/2021 Date of Service: 09/26/21 PCP: Rusty Aus, MD     Brief Narrative / Hospital Course:  ARIAN MCQUITTY is a 76 y.o. 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 She was sent to ED 09/23/2021 from the cancer center with generalized malaise, weakness, lethargy and a fever over the past 24 hours. She was given a dose of Rocephin for suspected UTI.  No culture results available. Mild cough.  Denies nausea, vomiting or abdominal pain.  07/05: ED course and early admission: Tmax 101.2, pulse 105, respirations 22 with BP 181/96 and O2 sat 97% on room air. Labs with WBC less than 0.1 with neutrophil percent 29%, hemoglobin 10 and platelets 90,000.  Lactic acid 2.4.  Potassium 3.2.  LFTs elevated with AST 124, ALT 140, alk phos 277 and total bili 1.5.  COVID PCR(+), UA c/w UTI. CXR no concerns. Patient treated with Rocephin, vancomycin and Flagyl as well as azithromycin, started on sepsis fluids and hospitalist consulted for admission for sepsis d/t UTI, COVID, other potential source d/t immune compromise, also neutropenic fever likely d/t above Added remdesevir.  07/06: continuing anti-infectives (meropenem, vancomycin, remdesevir), (+)blood cultures ESBL, consulted ID. Vanc d/c. Hyponatremia resolved. Hypokalemia repleted. AST/ALT trending down but still elevated. Lactate trending down. Still meeting sepsis criteria d/w febrile early this morning (improved), tachycardia (improved), tachypnea, low WBC. 07/07: VS and WBC improved, not meeting sepsis criteria though WBC still quite low. Bleeding from R middle finger nail - she bent this back w/ fall at home. Pressure bandage applied and also used  QuikClot dressing and splint. Renal US no concerns. ID following. LFT trending down.   07/08: VSS. AM labs mild hyponatremia, mild hypokalemia, WBC same at 0.2 compared to yesterday, hemoglobin stable at 9.0. Blood culture sensitivities resulted. ID following peripherally through the weekend. Repeat BCx drawn today and pending.   Consultants:  ID  Procedures: none    Subjective: Patient reports feeling better today, no CP/SOB. Finger has not been bleeding, bandage does not feel too tight, she'd like to keep it in place for now.      ASSESSMENT & PLAN:   Principal Problem:   Sepsis ([place]) Active Problems:   Neutropenic fever ([place])   Urinary tract infection   COVID-19 virus infection   Abnormal LFTs   DLBCL (diffuse large B cell lymphoma) (HCC)   Pancytopenia, chemotherapy induced(HCC)   Coronary artery disease   Essential hypertension   Hypothyroidism   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HCC)   OSA on CPAP   Paroxysmal atrial fibrillation (HCC)   History of breast cancer   Bacteremia   Finger nail contusion, initial encounter   Infection due to ESBL-producing Escherichia coli   Sepsis ([place]) Sepsis criteria includes tachycardia, tachypnea and fever with infection and lactic acidosis Septic sources include UTI, COVID, as well as other sources in view of immunosuppression from chemotherapy Sepsis fluid resuscitation Antibiotics/antiinfectives for UTI, COVID< ESBL Ecoli bacteremia  ID consult given bacteremia   Neutropenic fever ([place]) ANC of 29 based on WBC less than 0.1 and neutrophil percent 29% Likely source UTI and patient also COVID-positive pending further ID recommendations  Urinary tract infection Meropenem as above Follow cultures ID following given bacteremia  COVID-19 virus infection Suspecting incidental COVID  as patien symptomatic only for mild cough and chest x-ray clear Potential to get worse because of recent Chemo with R-CHOP and rituximab can make covid  worse Airborne precautions and neutropenic precautions Remdesivir x5 days total albuterol, antitussives prn  Pancytopenia, chemotherapy induced(HCC) Monitor cell lines If worsening may need to consider Oncology consult     DLBCL (diffuse large B cell lymphoma) ([place]) Started on first cycle chemo on 6/29  Coronary artery disease No complaints of chest pain Continue aspirin, atorvastatin, isosorbide, sotalol  nitroglycerin as needed  Essential hypertension Was mildly elevated on arrival, now controlled Continue sotalol, amlodipine Monitor for hypotension in view of sepsis diagnosis  Hypothyroidism Continue levothyroxine  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[place]) Diagnosed in 2017 followed by oncology  OSA on CPAP CPAP nightly  Paroxysmal atrial fibrillation (HCC) Continue sotalol and apixaban.   Monitor for bleeding  Abnormal LFTs LFTs elevated with AST 124, ALT 140, alk phos 277 and total bili 1.5 Patient had prior cholecystectomy Hep panel negative right upper quadrant ultrasound negative May be d/t chemo, follow periodic CMP  History of breast cancer Finishing up letrozole this month.  [name]  Finger nail contusion, initial encounter L 3rd digit, nail bent back fall at home, wont't stop oozing now that clotting bandage has worn off Pressure bandage placed w/ QuikClot dressing and splint over finger 09/25/2021 D/c Eliquis 07/07 given this bleed and low Plt, placed SCD    Infection due to ESBL-producing Escherichia coli ESBL E.coli UTI Renal US: No evidence of hydronephrosis or nephrolithiasis. Benign left renal cyst Bladder scan for post void measurement Abx as above pending culture sensitivities ID following        DVT prophylaxis: Apixiban (on this at home for PAfib) stopped d/t bleeding, on SCD now  Code Status: DNR Family Communication: Daughter at bedside today on rounds. Disposition Plan / TOC needs: anticipate d/c back tohome environment, may need HH,  possibly SNF pending PT/OT eval once more stable  Barriers to discharge / significant pending items: actively treating sepsis w/ multiple possible sources, bacteremia likely will require IV abx on eventual discharge, neutropenic fever, see A/P              Objective: Vitals:   09/26/21 1059 09/26/21 1102 09/26/21 1233 09/26/21 1350  BP: (!) 121/101  (!) 156/73   Pulse: 69  69 74  Resp: 18  17   Temp: 99.8 F (37.7 C)  98.9 F (37.2 C)   TempSrc: Oral     SpO2: 100% 98%  98%  Weight:      Height:        Intake/Output Summary (Last 24 hours) at 09/26/2021 1527 Last data filed at 09/26/2021 1057 Gross per 24 hour  Intake --  Output 1400 ml  Net -1400 ml  Net IO Since Admission: 1,726.86 mL [09/26/21 1527]  Filed Weights   09/23/21 1821  Weight: 80.2 kg    Examination:  Constitutional:  VS as above General Appearance: alert, well-developed, well-nourished, NAD Ears, Nose, Mouth, Throat: Normal appearance MMM, posterior pharynx without erythema/exudate Neck: No masses, trachea midline Respiratory: Normal respiratory effort Breath sounds normal except very slight crackles at bases c/w atelectasis  Cardiovascular: S1/S2 normal, no murmur/rub/gallop auscultated No lower extremity edema Gastrointestinal: Nontender, no masses No hernia appreciated Musculoskeletal:  No clubbing/cyanosis of digits L 3rd digit bleeding under finger nail, unable to stop w/ silver nitrate, bandage applied, see above  Neurological: No cranial nerve deficit on limited exam Psychiatric: Normal judgment/insight Normal  mood and affect       Scheduled Medications:   Current Facility-Administered Medications:    acetaminophen (TYLENOL) tablet 650 mg, 650 mg, Oral, Q6H PRN, 650 mg at 09/25/21 1416 **OR** acetaminophen (TYLENOL) suppository 650 mg, 650 mg, Rectal, Q6H PRN, Athena Masse, MD   aspirin EC tablet 81 mg, 81 mg, Oral, Daily, Athena Masse, MD, 81 mg at 09/26/21 0941    atorvastatin (LIPITOR) tablet 40 mg, 40 mg, Oral, QHS, Athena Masse, MD, 40 mg at 09/25/21 2107   calcium carbonate (TUMS - dosed in mg elemental calcium) chewable tablet 400 mg of elemental calcium, 400 mg of elemental calcium, Oral, TID WC PRN, Athena Masse, MD, 400 mg of elemental calcium at 09/26/21 0510   Chlorhexidine Gluconate Cloth 2 % PADS 6 each, 6 each, Topical, Daily, Emeterio Reeve, DO, 6 each at 09/26/21 0941   chlorpheniramine-HYDROcodone 10-8 MG/5ML suspension 5 mL, 5 mL, Oral, Q12H PRN, Athena Masse, MD   famotidine (PEPCID) tablet 40 mg, 40 mg, Oral, QHS, Athena Masse, MD, 40 mg at 09/25/21 2107   guaiFENesin-dextromethorphan (ROBITUSSIN DM) 100-10 MG/5ML syrup 10 mL, 10 mL, Oral, Q4H PRN, Athena Masse, MD   isosorbide mononitrate (IMDUR) 24 hr tablet 30 mg, 30 mg, Oral, Daily, Judd Gaudier V, MD, 30 mg at 09/26/21 0941   letrozole [facility]) tablet 2.5 mg, 2.5 mg, Oral, Daily, Judd Gaudier V, MD, 2.5 mg at 09/26/21 0941   levothyroxine (SYNTHROID) tablet 125 mcg, 125 mcg, Oral, Q0600, Athena Masse, MD, 125 mcg at 09/26/21 5790   magic mouthwash, 5 mL, Oral, TID PRN, Athena Masse, MD   meropenem (MERREM) 1 g in sodium chloride 0.9 % 100 mL IVPB, 1 g, Intravenous, Q8H, Athena Masse, MD, Last Rate: 200 mL/hr at 09/26/21 1509, 1 g at 09/26/21 1509   ondansetron (ZOFRAN) tablet 4 mg, 4 mg, Oral, Q6H PRN **OR** ondansetron (ZOFRAN) injection 4 mg, 4 mg, Intravenous, Q6H PRN, Athena Masse, MD, 4 mg at 09/26/21 0513   polyethylene glycol (MIRALAX / GLYCOLAX) packet 17 g, 17 g, Oral, BID PRN, Emeterio Reeve, DO, 17 g at 09/26/21 1050   remdesivir 100 mg in sodium chloride 0.9 % 100 mL IVPB, 100 mg, Intravenous, Daily, Tsosie Billing, MD, Stopped at 09/26/21 1126   sertraline (ZOLOFT) tablet 50 mg, 50 mg, Oral, QHS, Athena Masse, MD, 50 mg at 09/25/21 2107   silver nitrate applicators applicator 5 Stick, 5 Stick, Topical, Once, Emeterio Reeve, DO   sotalol (BETAPACE) tablet 80 mg, 80 mg, Oral, BID, Athena Masse, MD, 80 mg at 09/26/21 0941   Continuous Infusions:  meropenem (MERREM) IV 1 g (09/26/21 1509)   remdesivir 100 mg in sodium chloride 0.9 % 100 mL IVPB Stopped (09/26/21 1126)    PRN Medications:  acetaminophen **OR** acetaminophen, calcium carbonate, chlorpheniramine-HYDROcodone, guaiFENesin-dextromethorphan, magic mouthwash, ondansetron **OR** ondansetron (ZOFRAN) IV, polyethylene glycol  Antimicrobials:  Anti-infectives (From admission, onward)    Start     Dose/Rate Route Frequency Ordered Stop   09/26/21 1000  remdesivir 100 mg in sodium chloride 0.9 % 100 mL IVPB        100 mg 200 mL/hr over 30 Minutes Intravenous Daily 09/25/21 1358 09/28/21 0959   09/25/21 2300  vancomycin (VANCOREADY) IVPB 1750 mg/350 mL  Status:  Discontinued        1,750 mg 175 mL/hr over 120 Minutes Intravenous Every 48 hours 09/23/21 2241 09/24/21 0822   09/24/21 2200  vancomycin (VANCOREADY) IVPB 750 mg/150 mL  Status:  Discontinued        750 mg 150 mL/hr over 60 Minutes Intravenous Every 24 hours 09/24/21 0822 09/24/21 0935   09/24/21 1000  remdesivir 100 mg in sodium chloride 0.9 % 100 mL IVPB       See Hyperspace for full Linked Orders Report.   100 mg 200 mL/hr over 30 Minutes Intravenous Daily 09/23/21 2218 09/25/21 0939   09/23/21 2245  meropenem (MERREM) 1 g in sodium chloride 0.9 % 100 mL IVPB        1 g 200 mL/hr over 30 Minutes Intravenous Every 8 hours 09/23/21 2236     09/23/21 2230  remdesivir 200 mg in sodium chloride 0.9% 250 mL IVPB       See Hyperspace for full Linked Orders Report.   200 mg 580 mL/hr over 30 Minutes Intravenous Once 09/23/21 2218 09/24/21 0248   09/23/21 2130  vancomycin (VANCOREADY) IVPB 1750 mg/350 mL        1,750 mg 175 mL/hr over 120 Minutes Intravenous  Once 09/23/21 2116 09/24/21 0041   09/23/21 2115  metroNIDAZOLE (FLAGYL) IVPB 500 mg        500 mg 100 mL/hr over 60 Minutes  Intravenous  Once 09/23/21 2114 09/23/21 2314   09/23/21 1845  cefTRIAXone (ROCEPHIN) 2 g in sodium chloride 0.9 % 100 mL IVPB  Status:  Discontinued        2 g 200 mL/hr over 30 Minutes Intravenous Every 24 hours 09/23/21 1841 09/23/21 2237       Data Reviewed: I have personally reviewed following labs and imaging studies  CBC: Recent Labs  Lab 09/23/21 1413 09/23/21 1915 09/24/21 0554 09/25/21 0404 09/26/21 0629  WBC 0.7* <0.1* <0.1* 0.2* 0.2*  NEUTROABS 0.3* 0.0*  --   --   --   HGB 10.6* 10.9* 9.7* 9.3* 9.0*  HCT 33.3* 34.4* 31.1* 29.3* 28.5*  MCV 75.9* 75.1* 75.5* 74.0* 74.4*  PLT 119* 90* 50* 43* 43*   Basic Metabolic Panel: Recent Labs  Lab 09/23/21 1413 09/23/21 1915 09/24/21 0554 09/25/21 0404 09/26/21 0629  NA 133* 134* 138 137 133*  K 3.9 3.2* 2.9* 4.1 3.4*  CL 97* 97* 101 107 102  CO2 _0 GLUCOSE 105* 100* 105* 100* 111*  BUN _1 CREATININE 0.60 0.68 0.72 0.56 0.51  CALCIUM 8.6* 8.7* 7.9* 7.9* 8.3*  MG  --   --   --   --  2.0   GFR: Estimated Creatinine Clearance: 57.7 mL/min (by C-G formula based on SCr of 0.51 mg/dL). Liver Function Tests: Recent Labs  Lab 09/23/21 1413 09/23/21 1915 09/24/21 0554 09/25/21 0404  AST 115* 124* 112* 53*  ALT 122* 140* 127* 86*  ALKPHOS 231* 277* 260* 200*  BILITOT 1.0 1.5* 2.1* 1.1  PROT 6.2* 6.2* 5.3* 5.3*  ALBUMIN 2.6* 2.8* 2.4* 2.1*   No results for input(s): "LIPASE", "AMYLASE" in the last 168 hours. No results for input(s): "AMMONIA" in the last 168 hours. Coagulation Profile: Recent Labs  Lab 09/23/21 1915  INR 1.1   Cardiac Enzymes: No results for input(s): "CKTOTAL", "CKMB", "CKMBINDEX", "TROPONINI" in the last 168 hours. BNP (last 3 results) No results for input(s): "PROBNP" in the last 8760 hours. HbA1C: No results for input(s): "HGBA1C" in the last 72 hours. CBG: No results for input(s): "GLUCAP" in the last 168 hours. Lipid Profile: No results for input(s):  "CHOL", "HDL", "  [place]", "TRIG", "CHOLHDL", "LDLDIRECT" in the last 72 hours. Thyroid Function Tests: No results for input(s): "TSH", "T4TOTAL", "FREET4", "T3FREE", "THYROIDAB" in the last 72 hours. Anemia Panel: No results for input(s): "VITAMINB12", "FOLATE", "FERRITIN", "TIBC", "IRON", "RETICCTPCT" in the last 72 hours. Urine analysis:    Component Value Date/Time   COLORURINE YELLOW (A) 09/23/2021 1915   APPEARANCEUR CLOUDY (A) 09/23/2021 1915   LABSPEC 1.008 09/23/2021 1915   PHURINE 7.0 09/23/2021 1915   GLUCOSEU NEGATIVE 09/23/2021 1915   HGBUR LARGE (A) 09/23/2021 1915   BILIRUBINUR NEGATIVE 09/23/2021 1915   KETONESUR NEGATIVE 09/23/2021 1915   PROTEINUR NEGATIVE 09/23/2021 1915   NITRITE NEGATIVE 09/23/2021 1915   LEUKOCYTESUR LARGE (A) 09/23/2021 1915   Sepsis Labs: _0 (procalcitonin:4,lacticidven:4)  Recent Results (from the past 240 hour(s))  Urine culture     Status: Abnormal   Collection Time: 09/23/21  2:55 PM   Specimen: Urine, Random  Result Value Ref Range Status   Specimen Description   Final    URINE, RANDOM Performed at [facility], [address], [place], [place] [address]    Special Requests   Final    NONE Performed at [facility], [address] [place], [place] [address]    Culture (A)  Final    >=100,000 COLONIES/mL ESCHERICHIA COLI Confirmed Extended Spectrum Beta-Lactamase Producer (ESBL).  In bloodstream infections from ESBL organisms, carbapenems are preferred over piperacillin/tazobactam. They are shown to have a lower risk of mortality.    Report Status 09/26/2021 FINAL  Final   Organism ID, Bacteria ESCHERICHIA COLI (A)  Final      Susceptibility   Escherichia coli - MIC*    AMPICILLIN >=32 RESISTANT Resistant     CEFAZOLIN >=64 RESISTANT Resistant     CEFEPIME >=32 RESISTANT Resistant     CEFTRIAXONE >=64 RESISTANT Resistant     CIPROFLOXACIN >=4 RESISTANT Resistant     GENTAMICIN <=1 SENSITIVE Sensitive      IMIPENEM <=0.25 SENSITIVE Sensitive     NITROFURANTOIN 32 SENSITIVE Sensitive     TRIMETH/SULFA <=20 SENSITIVE Sensitive     AMPICILLIN/SULBACTAM >=32 RESISTANT Resistant     PIP/TAZO <=4 SENSITIVE Sensitive     * >=100,000 COLONIES/mL ESCHERICHIA COLI  Urine Culture     Status: Abnormal   Collection Time: 09/23/21  7:15 PM   Specimen: In/Out Cath Urine  Result Value Ref Range Status   Specimen Description   Final    IN/OUT CATH URINE Performed at [facility], [address], [place], [place] [address]    Special Requests   Final    NONE Performed at [facility], [address] [place], [place] [address]    Culture (A)  Final    >=100,000 COLONIES/mL ESCHERICHIA COLI Confirmed Extended Spectrum Beta-Lactamase Producer (ESBL).  In bloodstream infections from ESBL organisms, carbapenems are preferred over piperacillin/tazobactam. They are shown to have a lower risk of mortality.    Report Status 09/26/2021 FINAL  Final   Organism ID, Bacteria ESCHERICHIA COLI (A)  Final      Susceptibility   Escherichia coli - MIC*    AMPICILLIN >=32 RESISTANT Resistant     CEFAZOLIN >=64 RESISTANT Resistant     CEFEPIME >=32 RESISTANT Resistant     CEFTRIAXONE >=64 RESISTANT Resistant     CIPROFLOXACIN >=4 RESISTANT Resistant     GENTAMICIN <=1 SENSITIVE Sensitive     IMIPENEM <=0.25 SENSITIVE Sensitive     NITROFURANTOIN 32 SENSITIVE Sensitive     TRIMETH/SULFA <=20  SENSITIVE Sensitive     AMPICILLIN/SULBACTAM >=32 RESISTANT Resistant     PIP/TAZO <=4 SENSITIVE Sensitive     * >=100,000 COLONIES/mL ESCHERICHIA COLI  Resp Panel by RT-PCR (Flu A&B, Covid) Anterior Nasal Swab     Status: Abnormal   Collection Time: 09/23/21  7:30 PM   Specimen: Anterior Nasal Swab  Result Value Ref Range Status   SARS Coronavirus 2 by RT PCR POSITIVE (A) NEGATIVE Final    Comment: RESULT CALLED TO, READ BACK BY AND VERIFIED WITH: ASHLEY ORSUTO AT 2057 ON 09/23/21 BY  SS (NOTE) SARS-CoV-2 target nucleic acids are DETECTED.  The SARS-CoV-2 RNA is generally detectable in upper respiratory specimens during the acute phase of infection. Positive results are indicative of the presence of the identified virus, but do not rule out bacterial infection or co-infection with other pathogens not detected by the test. Clinical correlation with patient history and other diagnostic information is necessary to determine patient infection status. The expected result is Negative.  Fact Sheet for Patients: EntrepreneurPulse.com.au  Fact Sheet for Healthcare Providers: IncredibleEmployment.be  This test is not yet approved or cleared by the [place] FDA and  has been authorized for detection and/or diagnosis of SARS-CoV-2 by FDA under an Emergency Use Authorization (EUA).  This EUA will remain in effect (meaning this test ca n be used) for the duration of  the COVID-19 declaration under Section 564(b)(1) of the Act, 21 U.S.C. section 360bbb-3(b)(1), unless the authorization is terminated or revoked sooner.     Influenza A by PCR NEGATIVE NEGATIVE Final   Influenza B by PCR NEGATIVE NEGATIVE Final    Comment: (NOTE) The Xpert Xpress SARS-CoV-2/FLU/RSV plus assay is intended as an aid in the diagnosis of influenza from Nasopharyngeal swab specimens and should not be used as a sole basis for treatment. Nasal washings and aspirates are unacceptable for Xpert Xpress SARS-CoV-2/FLU/RSV testing.  Fact Sheet for Patients: EntrepreneurPulse.com.au  Fact Sheet for Healthcare Providers: IncredibleEmployment.be  This test is not yet approved or cleared by the [place] FDA and has been authorized for detection and/or diagnosis of SARS-CoV-2 by FDA under an Emergency Use Authorization (EUA). This EUA will remain in effect (meaning this test can be used) for the duration of the COVID-19  declaration under Section 564(b)(1) of the Act, 21 U.S.C. section 360bbb-3(b)(1), unless the authorization is terminated or revoked.  Performed at [facility], [address], [place], [place] [address]   Blood Culture (routine x 2)     Status: Abnormal   Collection Time: 09/23/21  7:30 PM   Specimen: BLOOD  Result Value Ref Range Status   Specimen Description   Final    BLOOD BLOOD RIGHT HAND Performed at [facility], [address], [place], [place] [address]    Special Requests   Final    BOTTLES DRAWN AEROBIC AND ANAEROBIC Blood Culture adequate volume Performed at [facility], [address] [place], [place] [address]    Culture  Setup Time   Final    GRAM NEGATIVE RODS IN BOTH AEROBIC AND ANAEROBIC BOTTLES Organism ID to follow CRITICAL RESULT CALLED TO, READ BACK BY AND VERIFIED WITH: Ricci Barker M. PHARMD [id number] 09/24/21 HNM GRAM STAIN REVIEWED-AGREE WITH RESULT Performed at [facility], [place] [address] [place], [place] [address]    Culture (A)  Final    ESCHERICHIA COLI Confirmed Extended Spectrum Beta-Lactamase Producer (ESBL).  In bloodstream infections from ESBL organisms, carbapenems are preferred over piperacillin/tazobactam. They are  shown to have a lower risk of mortality.    Report Status 09/26/2021 FINAL  Final   Organism ID, Bacteria ESCHERICHIA COLI  Final      Susceptibility   Escherichia coli - MIC*    AMPICILLIN >=32 RESISTANT Resistant     CEFAZOLIN >=64 RESISTANT Resistant     CEFEPIME 16 RESISTANT Resistant     CEFTAZIDIME RESISTANT Resistant     CEFTRIAXONE >=64 RESISTANT Resistant     CIPROFLOXACIN >=4 RESISTANT Resistant     GENTAMICIN <=1 SENSITIVE Sensitive     IMIPENEM <=0.25 SENSITIVE Sensitive     TRIMETH/SULFA <=20 SENSITIVE Sensitive     AMPICILLIN/SULBACTAM 16 INTERMEDIATE Intermediate     PIP/TAZO <=4 SENSITIVE Sensitive     * ESCHERICHIA COLI  Blood Culture ID Panel (Reflexed)     Status: Abnormal    Collection Time: 09/23/21  7:30 PM  Result Value Ref Range Status   Enterococcus faecalis NOT DETECTED NOT DETECTED Final   Enterococcus Faecium NOT DETECTED NOT DETECTED Final   Listeria monocytogenes NOT DETECTED NOT DETECTED Final   Staphylococcus species NOT DETECTED NOT DETECTED Final   Staphylococcus aureus (BCID) NOT DETECTED NOT DETECTED Final   Staphylococcus epidermidis NOT DETECTED NOT DETECTED Final   Staphylococcus lugdunensis NOT DETECTED NOT DETECTED Final   Streptococcus species NOT DETECTED NOT DETECTED Final   Streptococcus agalactiae NOT DETECTED NOT DETECTED Final   Streptococcus pneumoniae NOT DETECTED NOT DETECTED Final   Streptococcus pyogenes NOT DETECTED NOT DETECTED Final   A.calcoaceticus-baumannii NOT DETECTED NOT DETECTED Final   Bacteroides fragilis NOT DETECTED NOT DETECTED Final   Enterobacterales DETECTED (A) NOT DETECTED Final    Comment: Enterobacterales represent a large order of gram negative bacteria, not a single organism. CRITICAL RESULT CALLED TO, READ BACK BY AND VERIFIED WITH: ELVIRA M. PHARMD [id number] 09/24/21 HNM    Enterobacter cloacae complex NOT DETECTED NOT DETECTED Final   Escherichia coli DETECTED (A) NOT DETECTED Final    Comment: CRITICAL RESULT CALLED TO, READ BACK BY AND VERIFIED WITH: ELVIRA M. PHARMD [id number] 09/24/21 HNM    Klebsiella aerogenes NOT DETECTED NOT DETECTED Final   Klebsiella oxytoca NOT DETECTED NOT DETECTED Final   Klebsiella pneumoniae NOT DETECTED NOT DETECTED Final   Proteus species NOT DETECTED NOT DETECTED Final   Salmonella species NOT DETECTED NOT DETECTED Final   Serratia marcescens NOT DETECTED NOT DETECTED Final   Haemophilus influenzae NOT DETECTED NOT DETECTED Final   Neisseria meningitidis NOT DETECTED NOT DETECTED Final   Pseudomonas aeruginosa NOT DETECTED NOT DETECTED Final   Stenotrophomonas maltophilia NOT DETECTED NOT DETECTED Final   Candida albicans NOT DETECTED NOT DETECTED Final   Candida auris NOT  DETECTED NOT DETECTED Final   Candida glabrata NOT DETECTED NOT DETECTED Final   Candida krusei NOT DETECTED NOT DETECTED Final   Candida parapsilosis NOT DETECTED NOT DETECTED Final   Candida tropicalis NOT DETECTED NOT DETECTED Final   Cryptococcus neoformans/gattii NOT DETECTED NOT DETECTED Final   CTX-M ESBL DETECTED (A) NOT DETECTED Final    Comment: CRITICAL RESULT CALLED TO, READ BACK BY AND VERIFIED WITH: ELVIRA M. PHARMD [phone] 09/24/21 HNM (NOTE) Extended spectrum beta-lactamase detected. Recommend a carbapenem as initial therapy.      Carbapenem resistance IMP NOT DETECTED NOT DETECTED Final   Carbapenem resistance KPC NOT DETECTED NOT DETECTED Final   Carbapenem resistance NDM NOT DETECTED NOT DETECTED Final   Carbapenem resist OXA 48 LIKE NOT DETECTED NOT DETECTED Final   Carbapenem resistance  VIM NOT DETECTED NOT DETECTED Final    Comment: Performed at [facility], [address] [place], [place] [address]  Blood Culture (routine x 2)     Status: Abnormal   Collection Time: 09/23/21  8:34 PM   Specimen: BLOOD  Result Value Ref Range Status   Specimen Description   Final    BLOOD BLOOD RIGHT WRIST Performed at [facility] [facility], [address], [place], [place] [address]    Special Requests   Final    BOTTLES DRAWN AEROBIC AND ANAEROBIC Blood Culture results may not be optimal due to an inadequate volume of blood received in culture bottles Performed at [facility], [address] [place], [place] [address]    Culture  Setup Time   Final    GRAM NEGATIVE RODS IN BOTH AEROBIC AND ANAEROBIC BOTTLES CRITICAL VALUE NOTED.  VALUE IS CONSISTENT WITH PREVIOUSLY REPORTED AND CALLED VALUE. Performed at [facility], [address] [place], [place] [address]    Culture (A)  Final    ESCHERICHIA COLI SUSCEPTIBILITIES PERFORMED ON PREVIOUS CULTURE WITHIN THE LAST 5 DAYS. Performed at [facility], [place] [address], [place],  [place] [address]    Report Status 09/26/2021 FINAL  Final         Radiology Studies last 96 hours: US RENAL  Result Date: 09/25/2021 CLINICAL DATA:  Hydronephrosis EXAM: RENAL / URINARY TRACT ULTRASOUND COMPLETE COMPARISON:  08/19/2021 FINDINGS: Right Kidney: Renal measurements: 10.5 x 5.7 x 6.2 cm = volume: 196 mL. Echogenicity within normal limits. No mass or hydronephrosis visualized. Left Kidney: Renal measurements: 10.8 x 5.8 x 5.8 cm = volume: 190.7 mL. Echogenicity within normal limits. No hydronephrosis. Exophytic 1.9 cm cyst off the interpolar region. Bladder: Appears normal for degree of bladder distention. Other: None. IMPRESSION: 1. No evidence of hydronephrosis or nephrolithiasis. 2. Benign left renal cyst. Otherwise unremarkable appearance of the kidneys. Electronically Signed   By: Randa Ngo M.D.   On: 09/25/2021 21:56   US Abdomen Limited RUQ (LIVER/GB)  Result Date: 09/23/2021 CLINICAL DATA:  Elevated LFTs EXAM: ULTRASOUND ABDOMEN LIMITED RIGHT UPPER QUADRANT COMPARISON:  01/06/2021 FINDINGS: Gallbladder: Surgically removed Common bile duct: Diameter: 5.4 mm. Liver: No focal lesion identified. Within normal limits in parenchymal echogenicity. 3 mm calcification is noted in the right lobe of the liver stable from prior CT. Portal vein is patent on color Doppler imaging with normal direction of blood flow towards the liver. Other: None. IMPRESSION: Status post cholecystectomy. No acute abnormality to correspond with the given clinical history is noted. Electronically Signed   By: Inez Catalina M.D.   On: 09/23/2021 23:30   DG Chest Port 1 View  Result Date: 09/23/2021 CLINICAL DATA:  A 76 year old female presents with questionable sepsis. EXAM: PORTABLE CHEST 1 VIEW COMPARISON:  September 07, 2021. FINDINGS: EKG leads project over the chest. RIGHT-sided Port-A-Cath terminates at the caval to atrial junction. Cardiomediastinal contours and hilar structures are stable. Lungs are clear. No  visible pneumothorax. On limited assessment there is no acute skeletal finding. IMPRESSION: 1. No active disease. 2. RIGHT-sided Port-A-Cath terminates at the caval to atrial junction. 3. Known adenopathy in the chest and lower neck better seen on prior PET imaging Electronically Signed   By: Zetta Bills M.D.   On: 09/23/2021 19:49            LOS: 3 days    Time spent: 50 min    Emeterio Reeve, DO [facility] 09/26/2021,  3:27 PM   Staff may message me via secure chat in [place]  but this may not receive immediate response,  please page for urgent matters!  If 7PM-7AM, please contact night-coverage [email or web address]  Dictation software was used to generate the above note. Typos may occur and escape review, as with typed/written notes. Please contact Dr Sheppard Coil directly for clarity if needed.

## 2021-09-27 DIAGNOSIS — R7881 Bacteremia: Secondary | ICD-10-CM | POA: Diagnosis not present

## 2021-09-27 DIAGNOSIS — A419 Sepsis, unspecified organism: Secondary | ICD-10-CM | POA: Diagnosis not present

## 2021-09-27 DIAGNOSIS — I2510 Atherosclerotic heart disease of native coronary artery without angina pectoris: Secondary | ICD-10-CM | POA: Diagnosis not present

## 2021-09-27 DIAGNOSIS — R7989 Other specified abnormal findings of blood chemistry: Secondary | ICD-10-CM | POA: Diagnosis not present

## 2021-09-27 LAB — CBC
HCT: 28.3 % — ABNORMAL LOW (ref 36.0–46.0)
Hemoglobin: 8.9 g/dL — ABNORMAL LOW (ref 12.0–15.0)
MCH: 23.4 pg — ABNORMAL LOW (ref 26.0–34.0)
MCHC: 31.4 g/dL (ref 30.0–36.0)
MCV: 74.5 fL — ABNORMAL LOW (ref 80.0–100.0)
Platelets: 38 10*3/uL — ABNORMAL LOW (ref 150–400)
RBC: 3.8 MIL/uL — ABNORMAL LOW (ref 3.87–5.11)
RDW: 16.2 % — ABNORMAL HIGH (ref 11.5–15.5)
WBC: 0.2 10*3/uL — CL (ref 4.0–10.5)
nRBC: 0 % (ref 0.0–0.2)

## 2021-09-27 LAB — BASIC METABOLIC PANEL
Anion gap: 7 (ref 5–15)
BUN: 12 mg/dL (ref 8–23)
CO2: 27 mmol/L (ref 22–32)
Calcium: 8 mg/dL — ABNORMAL LOW (ref 8.9–10.3)
Chloride: 100 mmol/L (ref 98–111)
Creatinine, Ser: 0.49 mg/dL (ref 0.44–1.00)
GFR, Estimated: >60 mL/min (ref >60)
Glucose, Bld: 104 mg/dL — ABNORMAL HIGH (ref 70–99)
Potassium: 3.7 mmol/L (ref 3.5–5.1)
Sodium: 134 mmol/L — ABNORMAL LOW (ref 135–145)

## 2021-09-27 MED ORDER — POTASSIUM CHLORIDE CRYS ER 20 MEQ PO TBCR
40.0000 meq | EXTENDED_RELEASE_TABLET | Freq: Once | ORAL | Status: AC
  Administered 2021-09-27: 40 meq via ORAL
  Filled 2021-09-27: qty 2

## 2021-09-27 NOTE — Evaluation (Addendum)
Physical Therapy Evaluation Patient Details Name: Stacey Stone MRN: [id number] DOB: 1945/12/24 Today's Date: 09/27/2021  History of Present Illness  Pt is a 76 y/o F admitted on 09/23/21 after presenting from the [facility] with genearlized malaise, weakness, lethargy & fever x 24 hours. Pt found to be covid+. pt being treated for sepsis with sources including UTI, COVID, & other sources in view of immunosuppression from chemotherapy. 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 Since June pt has experienced a functional decline 2/2 not feeling well where she has been ambulating household distances without AD but holding to furniture PRN & 1 fall after chemo tx & vomiting episode pt believes was due to weakness. On this date, pt completes transfers with close supervision<>CGA & ambulates to bathroom/back without AD with guarded, very slow decreased gait speed. After toileting, provided pt with RW & pt ambulates in room with supervision & slightly increased gait speed. Educated pt on recommendation of RW (& walker bag to transport items) & HHPT f/u with pt agreeable. Will continue to follow pt acutely to address balance, strengthening, and gait with LRAD.  MD cleared pt for participation in setting of low WBC.    Recommendations for follow up therapy are one component of a multi-disciplinary discharge planning process, led by the attending physician.  Recommendations may be updated based on patient status, additional functional criteria and insurance authorization.  Follow Up Recommendations Home health PT      Assistance Recommended at Discharge Intermittent Supervision/Assistance   Patient can return home with the following  A little help with walking and/or transfers;A little help with bathing/dressing/bathroom;Assist for transportation;Direct supervision/assist for medications management;Assistance with cooking/housework;Help with stairs or ramp for entrance    Equipment Recommendations Rolling walker (2 wheels);BSC/3in1  Recommendations for Other Services       Functional Status Assessment Patient has had a recent decline in their functional status and demonstrates the ability to make significant improvements in function in a reasonable and predictable amount of time.     Precautions / Restrictions Precautions Precautions: Fall Restrictions Weight Bearing Restrictions: No      Mobility  Bed Mobility Overal bed mobility: Modified Independent             General bed mobility comments: supine<>sit with HOB elevated    Transfers Overall transfer level: Needs assistance Equipment used: None Transfers: Sit to/from Stand, Bed to chair/wheelchair/BSC Sit to Stand: Supervision, Min guard   Step pivot transfers: Supervision            Ambulation/Gait Ambulation/Gait assistance: Min assist, Min guard, Supervision Gait Distance (Feet):  (20 ft + 20 ft without AD with CGA<>min assist, 40 ft with RW & supervision) Assistive device: None, Rolling walker (2 wheels) Gait Pattern/deviations: Decreased step length - right, Decreased step length - left, Decreased stride length, Decreased dorsiflexion - right, Decreased dorsiflexion - left Gait velocity: significantly decreased     General Gait Details: Guarded gait, slow gait speed, occasionally reaching for objects for support, downward gaze with cuing for forward gaze  Stairs            Wheelchair Mobility    Modified Rankin (Stroke Patients Only)  Balance Overall balance assessment: Needs assistance Sitting-balance support: Feet supported, Bilateral upper extremity  supported Sitting balance-Leahy Scale: Good     Standing balance support: During functional activity, No upper extremity supported Standing balance-Leahy Scale: Poor                               Pertinent Vitals/Pain Pain Assessment Pain Assessment: No/denies pain    Home Living Family/patient expects to be discharged to:: Private residence Living Arrangements: Children Available Help at Discharge: Family;Available PRN/intermittently Type of Home:  (townhouse) Home Access: Stairs to enter Entrance Stairs-Rails: None Entrance Stairs-Number of Steps: 1 threshold step   Home Layout: Two level;Able to live on main level with bedroom/bathroom Home Equipment: Kasandra Knudsen - single point      Prior Function Prior Level of Function : Independent/Modified Independent;Working/employed;Driving             Mobility Comments: Pt & family report pt has been experiencing significant decline since beginning of June, furniture walking & not driving but prior to that pt was ambulatory without AD, driving, working in Engineer, 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 Pt endorses 1 fall after vomiting 2/2 CA tx (possibly 2/2 feeling weak afterwards)       Hand Dominance        Extremity/Trunk Assessment   Upper Extremity Assessment Upper Extremity Assessment: Overall WFL for tasks assessed    Lower Extremity Assessment Lower Extremity Assessment: Generalized weakness       Communication   Communication: No difficulties  Cognition Arousal/Alertness: Awake/alert Behavior During Therapy: WFL for tasks assessed/performed Overall Cognitive Status: Within Functional Limits for tasks assessed                                 General Comments: very sweet lady, good understanding of info        General Comments General comments (skin integrity, edema, etc.): SpO2 97% on room air    Exercises     Assessment/Plan    PT Assessment Patient needs continued PT services   PT Problem List Decreased strength;Decreased activity tolerance;Decreased balance;Decreased knowledge of use of DME;Decreased mobility       PT Treatment Interventions DME instruction;Therapeutic exercise;Gait training;Balance training;Stair training;Neuromuscular re-education;Functional mobility training;Patient/family education;Therapeutic activities    PT Goals (Current goals can be found in the Care Plan section)  Acute Rehab PT Goals Patient Stated Goal: get better PT Goal Formulation: With patient Time For Goal Achievement: 10/11/21 Potential to Achieve Goals: Good    Frequency Min 2X/week     Co-evaluation               AM-PAC PT "6 Clicks" Mobility  Outcome Measure Help needed turning from your back to your side while in a flat bed without using bedrails?: None Help needed moving from lying on your back to sitting on the side of a flat bed without using bedrails?: None Help needed moving to and from a bed to a chair (including a wheelchair)?: A Little Help needed standing up from a chair using your arms (e.g., wheelchair or bedside chair)?: A Little Help needed to walk in hospital room?: A Little Help needed climbing 3-5 steps with a railing? : A Little 6 Click Score: 20    End of Session   Activity Tolerance: Patient tolerated treatment well Patient left: in chair;with chair alarm set;with call bell/phone within reach Nurse  Communication: Mobility status PT Visit Diagnosis: Unsteadiness on feet (R26.81);Muscle weakness (generalized) (M62.81);Other abnormalities of gait and mobility (R26.89)    Time: 1583-0940 PT Time Calculation (min) (ACUTE ONLY): 28 min   Charges:   PT Evaluation $PT Eval Low Complexity: 1 Low PT Treatments $Therapeutic Activity: 8-22 mins        Lavone Nian, PT, DPT 09/27/21, 10:47 AM   Waunita Schooner 09/27/2021, 10:44 AM

## 2021-09-27 NOTE — Progress Notes (Signed)
PROGRESS NOTE    ETHYL VILA  GGE:[id number] DOB: 08/26/1945  DOA: 09/23/2021 Date of Service: 09/27/21 PCP: Rusty Aus, MD     Brief Narrative / Hospital Course:  MANALI MCELMURRY is a 76 y.o. 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 She was sent to ED 09/23/2021 from the cancer center with generalized malaise, weakness, lethargy and a fever over the past 24 hours. She was given a dose of Rocephin for suspected UTI.  No culture results available. Mild cough.  Denies nausea, vomiting or abdominal pain.  07/05: ED course and early admission: Tmax 101.2, pulse 105, respirations 22 with BP 181/96 and O2 sat 97% on room air. Labs with WBC less than 0.1 with neutrophil percent 29%, hemoglobin 10 and platelets 90,000.  Lactic acid 2.4.  Potassium 3.2.  LFTs elevated with AST 124, ALT 140, alk phos 277 and total bili 1.5.  COVID PCR(+), UA c/w UTI. CXR no concerns. Patient treated with Rocephin, vancomycin and Flagyl as well as azithromycin, started on sepsis fluids and hospitalist consulted for admission for sepsis d/t UTI, COVID, other potential source d/t immune compromise, also neutropenic fever likely d/t above Added remdesevir.  07/06: continuing anti-infectives (meropenem, vancomycin, remdesevir), (+)blood cultures ESBL, consulted ID. Vanc d/c. Hyponatremia resolved. Hypokalemia repleted. AST/ALT trending down but still elevated. Lactate trending down. Still meeting sepsis criteria d/w febrile early this morning (improved), tachycardia (improved), tachypnea, low WBC. 07/07: VS and WBC improved, not meeting sepsis criteria though WBC still quite low. Bleeding from R middle finger nail - she bent this back w/ fall at home. Pressure bandage applied and also used  QuikClot dressing and splint. Renal US no concerns. ID following. LFT trending down.   07/08: VSS. AM labs mild hyponatremia, mild hypokalemia, WBC same at 0.2 compared to yesterday, hemoglobin stable at 9.0. Blood culture sensitivities resulted. ID following peripherally through the weekend. Repeat BCx drawn today.  07/09: VSS. AM labs slight decrease Plt to 38, WBC stable 0.2, sodium improving. Repeat blood cultures NG <24h. Remains on Meropenem and Remdesevir.  Consultants:  ID  Procedures: none    Subjective: Patient reports feeling significantly better today, more energetic.  Bandage on finger has not oozed/bled.      ASSESSMENT & PLAN:   Principal Problem:   Sepsis ([place]) Active Problems:   Neutropenic fever ([place])   Urinary tract infection   COVID-19 virus infection   Abnormal LFTs   DLBCL (diffuse large B cell lymphoma) (HCC)   Pancytopenia, chemotherapy induced(HCC)   Coronary artery disease   Essential hypertension   Hypothyroidism   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HCC)   OSA on CPAP   Paroxysmal atrial fibrillation (HCC)   History of breast cancer   Bacteremia   Finger nail contusion, initial encounter   Bacteremia due to ESBL-producing Escherichia coli source UTI   Sepsis ([place]) Resolved Treating UTI, bacteremia d/t UTI, COVID   Neutropenic fever ([place]) POA, resolved ANC of 29 based on WBC less than 0.1 and neutrophil percent 29% Likely source UTI and patient also COVID-positive ID following  Urinary tract infection Follow cultures:  ESBL on initial cultures Urine/blood.  Repeat cultures drawn, pending  Continuing meropenem ID following given bacteremia  COVID-19 virus infection Suspecting incidental COVID as patien symptomatic only for mild cough and chest x-ray clear Potential to  get worse because of recent Chemo with R-CHOP and rituximab can make covid worse Airborne precautions and neutropenic precautions Remdesivir x5 days total albuterol, antitussives  prn  Pancytopenia, chemotherapy induced(HCC) Monitor cell lines: WBC Hgb stabl, Plt down a bit If worsening may need to consider Oncology consult     DLBCL (diffuse large B cell lymphoma) ([place]) Started on first cycle chemo on 6/29  Coronary artery disease No complaints of chest pain Continue aspirin, atorvastatin, isosorbide, sotalol  nitroglycerin as needed  Essential hypertension Was mildly elevated on arrival, now controlled Continue sotalol, amlodipine Monitor for hypotension in view of sepsis diagnosis  Hypothyroidism Continue levothyroxine  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[place]) Diagnosed in 2017 followed by oncology  OSA on CPAP CPAP nightly  Paroxysmal atrial fibrillation ([place]) Continue sotalol  Apixiban held d/t finger bleed  Abnormal LFTs LFTs elevated with AST 124, ALT 140, alk phos 277 and total bili 1.5 Patient had prior cholecystectomy Hep panel negative right upper quadrant ultrasound negative May be d/t chemo, follow periodic CMP  History of breast cancer Finishing up letrozole this month.  [name]  Finger nail contusion, initial encounter L 3rd digit, nail bent back fall at home, wont't stop oozing now that clotting bandage has worn off Pressure bandage placed w/ QuikClot dressing and splint over finger 09/25/2021 D/c Eliquis 07/07 given this bleed and low Plt, placed SCD    Bacteremia due to ESBL-producing Escherichia coli source UTI ESBL E.coli UTI Renal US: No evidence of hydronephrosis or nephrolithiasis. Benign left renal cyst Bladder scan for post void measurement Abx as above pending repeat culture, should have results tomorrow 07/10 ID following        DVT prophylaxis: Apixiban (on this at home for PAfib) stopped d/t bleeding, on SCD now  Code Status: DNR Family Communication: daughter at bedside on rounds.  Disposition Plan / TOC needs: anticipate d/c back to home environment, may need HH, possibly SNF pending PT/OT eval once more  stable  Barriers to discharge / significant pending items: actively treating bacteremia awaiting cultures and eventually will need ID recs re: abx on discharge             Objective: Vitals:   09/26/21 2126 09/27/21 0011 09/27/21 0525 09/27/21 0753  BP: (!) 155/86 (!) 158/78 (!) 177/97 (!) 159/90  Pulse: 83  76 75  Resp: 16 18 20 18   Temp: 98.1 F (36.7 C) 98 F (36.7 C) 98.8 F (37.1 C) 98.2 F (36.8 C)  TempSrc: Oral Oral Oral Oral  SpO2: 99%   99%  Weight:      Height:        Intake/Output Summary (Last 24 hours) at 09/27/2021 1147 Last data filed at 09/27/2021 0759 Gross per 24 hour  Intake 800.31 ml  Output 975 ml  Net -174.69 ml  Net IO Since Admission: 1,552.17 mL [09/27/21 1147]  Filed Weights   09/23/21 1821  Weight: 80.2 kg    Examination:  Constitutional:  VS as above General Appearance: alert, well-developed, well-nourished, NAD Ears, Nose, Mouth, Throat: Normal appearance Neck: No masses, trachea midline Respiratory: Normal respiratory effort Breath sounds normal  Cardiovascular: S1/S2 normal, no murmur/rub/gallop auscultated No lower extremity edema Gastrointestinal: Nontender, no masses No hernia appreciated Musculoskeletal:  No clubbing/cyanosis of digits L 3rd digit bandaged, no oozing Neurological: No cranial nerve deficit on limited exam Psychiatric: Normal judgment/insight Normal mood and affect       Scheduled Medications:   Current Facility-Administered Medications:    acetaminophen (TYLENOL)  tablet 650 mg, 650 mg, Oral, Q6H PRN, 650 mg at 09/25/21 1416 **OR** acetaminophen (TYLENOL) suppository 650 mg, 650 mg, Rectal, Q6H PRN, Athena Masse, MD   aspirin EC tablet 81 mg, 81 mg, Oral, Daily, Judd Gaudier V, MD, 81 mg at 09/27/21 0949   atorvastatin (LIPITOR) tablet 40 mg, 40 mg, Oral, QHS, Athena Masse, MD, 40 mg at 09/26/21 2121   calcium carbonate (TUMS - dosed in mg elemental calcium) chewable tablet 400 mg of  elemental calcium, 400 mg of elemental calcium, Oral, TID WC PRN, Athena Masse, MD, 400 mg of elemental calcium at 09/26/21 0510   Chlorhexidine Gluconate Cloth 2 % PADS 6 each, 6 each, Topical, Daily, Emeterio Reeve, DO, 6 each at 09/27/21 0955   chlorpheniramine-HYDROcodone 10-8 MG/5ML suspension 5 mL, 5 mL, Oral, Q12H PRN, Athena Masse, MD   famotidine (PEPCID) tablet 40 mg, 40 mg, Oral, QHS, Judd Gaudier V, MD, 40 mg at 09/26/21 2121   guaiFENesin-dextromethorphan (ROBITUSSIN DM) 100-10 MG/5ML syrup 10 mL, 10 mL, Oral, Q4H PRN, Athena Masse, MD   isosorbide mononitrate (IMDUR) 24 hr tablet 30 mg, 30 mg, Oral, Daily, Judd Gaudier V, MD, 30 mg at 09/27/21 0949   letrozole [facility]) tablet 2.5 mg, 2.5 mg, Oral, Daily, Judd Gaudier V, MD, 2.5 mg at 09/27/21 0949   levothyroxine (SYNTHROID) tablet 125 mcg, 125 mcg, Oral, Q0600, Athena Masse, MD, 125 mcg at 09/27/21 0530   magic mouthwash, 5 mL, Oral, TID PRN, Athena Masse, MD   meropenem (MERREM) 1 g in sodium chloride 0.9 % 100 mL IVPB, 1 g, Intravenous, Q8H, Judd Gaudier V, MD, Stopped at 09/27/21 0830   ondansetron (ZOFRAN) tablet 4 mg, 4 mg, Oral, Q6H PRN **OR** ondansetron (ZOFRAN) injection 4 mg, 4 mg, Intravenous, Q6H PRN, Judd Gaudier V, MD, 4 mg at 09/27/21 0004   polyethylene glycol (MIRALAX / GLYCOLAX) packet 17 g, 17 g, Oral, BID PRN, Emeterio Reeve, DO, 17 g at 09/27/21 0950   potassium chloride SA (KLOR-CON M) CR tablet 40 mEq, 40 mEq, Oral, Once, Nazari, Walid A, RPH   sertraline (ZOLOFT) tablet 50 mg, 50 mg, Oral, QHS, Judd Gaudier V, MD, 50 mg at 09/26/21 2121   silver nitrate applicators applicator 5 Stick, 5 Stick, Topical, Once, Emeterio Reeve, DO   sotalol (BETAPACE) tablet 80 mg, 80 mg, Oral, BID, Judd Gaudier V, MD, 80 mg at 09/27/21 0950   Continuous Infusions:  meropenem (MERREM) IV Stopped (09/27/21 0830)    PRN Medications:  acetaminophen **OR** acetaminophen, calcium carbonate,  chlorpheniramine-HYDROcodone, guaiFENesin-dextromethorphan, magic mouthwash, ondansetron **OR** ondansetron (ZOFRAN) IV, polyethylene glycol  Antimicrobials:  Anti-infectives (From admission, onward)    Start     Dose/Rate Route Frequency Ordered Stop   09/26/21 1000  remdesivir 100 mg in sodium chloride 0.9 % 100 mL IVPB        100 mg 200 mL/hr over 30 Minutes Intravenous Daily 09/25/21 1358 09/27/21 1025   09/25/21 2300  vancomycin (VANCOREADY) IVPB 1750 mg/350 mL  Status:  Discontinued        1,750 mg 175 mL/hr over 120 Minutes Intravenous Every 48 hours 09/23/21 2241 09/24/21 0822   09/24/21 2200  vancomycin (VANCOREADY) IVPB 750 mg/150 mL  Status:  Discontinued        750 mg 150 mL/hr over 60 Minutes Intravenous Every 24 hours 09/24/21 0822 09/24/21 0935   09/24/21 1000  remdesivir 100 mg in sodium chloride 0.9 % 100 mL IVPB  See Hyperspace for full Linked Orders Report.   100 mg 200 mL/hr over 30 Minutes Intravenous Daily 09/23/21 2218 09/25/21 0939   09/23/21 2245  meropenem (MERREM) 1 g in sodium chloride 0.9 % 100 mL IVPB        1 g 200 mL/hr over 30 Minutes Intravenous Every 8 hours 09/23/21 2236     09/23/21 2230  remdesivir 200 mg in sodium chloride 0.9% 250 mL IVPB       See Hyperspace for full Linked Orders Report.   200 mg 580 mL/hr over 30 Minutes Intravenous Once 09/23/21 2218 09/24/21 0248   09/23/21 2130  vancomycin (VANCOREADY) IVPB 1750 mg/350 mL        1,750 mg 175 mL/hr over 120 Minutes Intravenous  Once 09/23/21 2116 09/24/21 0041   09/23/21 2115  metroNIDAZOLE (FLAGYL) IVPB 500 mg        500 mg 100 mL/hr over 60 Minutes Intravenous  Once 09/23/21 2114 09/23/21 2314   09/23/21 1845  cefTRIAXone (ROCEPHIN) 2 g in sodium chloride 0.9 % 100 mL IVPB  Status:  Discontinued        2 g 200 mL/hr over 30 Minutes Intravenous Every 24 hours 09/23/21 1841 09/23/21 2237       Data Reviewed: I have personally reviewed following labs and imaging  studies  CBC: Recent Labs  Lab 09/23/21 1413 09/23/21 1915 09/24/21 0554 09/25/21 0404 09/26/21 0629 09/27/21 0532  WBC 0.7* <0.1* <0.1* 0.2* 0.2* 0.2*  NEUTROABS 0.3* 0.0*  --   --   --   --   HGB 10.6* 10.9* 9.7* 9.3* 9.0* 8.9*  HCT 33.3* 34.4* 31.1* 29.3* 28.5* 28.3*  MCV 75.9* 75.1* 75.5* 74.0* 74.4* 74.5*  PLT 119* 90* 50* 43* 43* 38*   Basic Metabolic Panel: Recent Labs  Lab 09/23/21 1915 09/24/21 0554 09/25/21 0404 09/26/21 0629 09/27/21 0532  NA 134* 138 137 133* 134*  K 3.2* 2.9* 4.1 3.4* 3.7  CL 97* 101 107 102 100  CO2 27 27 27 25 27   GLUCOSE 100* 105* 100* 111* 104*  BUN 17 15 18 12 12   CREATININE 0.68 0.72 0.56 0.51 0.49  CALCIUM 8.7* 7.9* 7.9* 8.3* 8.0*  MG  --   --   --  2.0  --    GFR: Estimated Creatinine Clearance: 57.7 mL/min (by C-G formula based on SCr of 0.49 mg/dL). Liver Function Tests: Recent Labs  Lab 09/23/21 1413 09/23/21 1915 09/24/21 0554 09/25/21 0404  AST 115* 124* 112* 53*  ALT 122* 140* 127* 86*  ALKPHOS 231* 277* 260* 200*  BILITOT 1.0 1.5* 2.1* 1.1  PROT 6.2* 6.2* 5.3* 5.3*  ALBUMIN 2.6* 2.8* 2.4* 2.1*   No results for input(s): "LIPASE", "AMYLASE" in the last 168 hours. No results for input(s): "AMMONIA" in the last 168 hours. Coagulation Profile: Recent Labs  Lab 09/23/21 1915  INR 1.1   Cardiac Enzymes: No results for input(s): "CKTOTAL", "CKMB", "CKMBINDEX", "TROPONINI" in the last 168 hours. BNP (last 3 results) No results for input(s): "PROBNP" in the last 8760 hours. HbA1C: No results for input(s): "HGBA1C" in the last 72 hours. CBG: No results for input(s): "GLUCAP" in the last 168 hours. Lipid Profile: No results for input(s): "CHOL", "HDL", "LDLCALC", "TRIG", "CHOLHDL", "LDLDIRECT" in the last 72 hours. Thyroid Function Tests: No results for input(s): "TSH", "T4TOTAL", "FREET4", "T3FREE", "THYROIDAB" in the last 72 hours. Anemia Panel: No results for input(s): "VITAMINB12", "FOLATE", "FERRITIN",  "TIBC", "IRON", "RETICCTPCT" in the last 72 hours. Urine  analysis:    Component Value Date/Time   COLORURINE YELLOW (A) 09/23/2021 1915   APPEARANCEUR CLOUDY (A) 09/23/2021 1915   LABSPEC 1.008 09/23/2021 1915   PHURINE 7.0 09/23/2021 1915   GLUCOSEU NEGATIVE 09/23/2021 1915   HGBUR LARGE (A) 09/23/2021 1915   BILIRUBINUR NEGATIVE 09/23/2021 [place] 09/23/2021 1915   PROTEINUR NEGATIVE 09/23/2021 1915   NITRITE NEGATIVE 09/23/2021 1915   LEUKOCYTESUR LARGE (A) 09/23/2021 1915   Sepsis Labs: @LABRCNTIP (procalcitonin:4,lacticidven:4)  Recent Results (from the past 240 hour(s))  Urine culture     Status: Abnormal   Collection Time: 09/23/21  2:55 PM   Specimen: Urine, Random  Result Value Ref Range Status   Specimen Description   Final    URINE, RANDOM Performed at [facility], [address], [place], [place] [address]    Special Requests   Final    NONE Performed at [facility], [address] [place], [place] [address]    Culture (A)  Final    >=100,000 COLONIES/mL ESCHERICHIA COLI Confirmed Extended Spectrum Beta-Lactamase Producer (ESBL).  In bloodstream infections from ESBL organisms, carbapenems are preferred over piperacillin/tazobactam. They are shown to have a lower risk of mortality.    Report Status 09/26/2021 FINAL  Final   Organism ID, Bacteria ESCHERICHIA COLI (A)  Final      Susceptibility   Escherichia coli - MIC*    AMPICILLIN >=32 RESISTANT Resistant     CEFAZOLIN >=64 RESISTANT Resistant     CEFEPIME >=32 RESISTANT Resistant     CEFTRIAXONE >=64 RESISTANT Resistant     CIPROFLOXACIN >=4 RESISTANT Resistant     GENTAMICIN <=1 SENSITIVE Sensitive     IMIPENEM <=0.25 SENSITIVE Sensitive     NITROFURANTOIN 32 SENSITIVE Sensitive     TRIMETH/SULFA <=20 SENSITIVE Sensitive     AMPICILLIN/SULBACTAM >=32 RESISTANT Resistant     PIP/TAZO <=4 SENSITIVE Sensitive     * >=100,000 COLONIES/mL ESCHERICHIA COLI  Urine Culture      Status: Abnormal   Collection Time: 09/23/21  7:15 PM   Specimen: In/Out Cath Urine  Result Value Ref Range Status   Specimen Description   Final    IN/OUT CATH URINE Performed at [facility], [address] [place], [place] [address]    Special Requests   Final    NONE Performed at [facility], [address] [place], [place] [address]    Culture (A)  Final    >=100,000 COLONIES/mL ESCHERICHIA COLI Confirmed Extended Spectrum Beta-Lactamase Producer (ESBL).  In bloodstream infections from ESBL organisms, carbapenems are preferred over piperacillin/tazobactam. They are shown to have a lower risk of mortality.    Report Status 09/26/2021 FINAL  Final   Organism ID, Bacteria ESCHERICHIA COLI (A)  Final      Susceptibility   Escherichia coli - MIC*    AMPICILLIN >=32 RESISTANT Resistant     CEFAZOLIN >=64 RESISTANT Resistant     CEFEPIME >=32 RESISTANT Resistant     CEFTRIAXONE >=64 RESISTANT Resistant     CIPROFLOXACIN >=4 RESISTANT Resistant     GENTAMICIN <=1 SENSITIVE Sensitive     IMIPENEM <=0.25 SENSITIVE Sensitive     NITROFURANTOIN 32 SENSITIVE Sensitive     TRIMETH/SULFA <=20 SENSITIVE Sensitive     AMPICILLIN/SULBACTAM >=32 RESISTANT Resistant     PIP/TAZO <=4 SENSITIVE Sensitive     * >=100,000 COLONIES/mL ESCHERICHIA COLI  Resp Panel by RT-PCR (Flu A&B, Covid) Anterior Nasal Swab     Status: Abnormal  Collection Time: 09/23/21  7:30 PM   Specimen: Anterior Nasal Swab  Result Value Ref Range Status   SARS Coronavirus 2 by RT PCR POSITIVE (A) NEGATIVE Final    Comment: RESULT CALLED TO, READ BACK BY AND VERIFIED WITH: ASHLEY ORSUTO AT 2057 ON 09/23/21 BY SS (NOTE) SARS-CoV-2 target nucleic acids are DETECTED.  The SARS-CoV-2 RNA is generally detectable in upper respiratory specimens during the acute phase of infection. Positive results are indicative of the presence of the identified virus, but do not rule out bacterial infection or  co-infection with other pathogens not detected by the test. Clinical correlation with patient history and other diagnostic information is necessary to determine patient infection status. The expected result is Negative.  Fact Sheet for Patients: EntrepreneurPulse.com.au  Fact Sheet for Healthcare Providers: IncredibleEmployment.be  This test is not yet approved or cleared by the [place] FDA and  has been authorized for detection and/or diagnosis of SARS-CoV-2 by FDA under an Emergency Use Authorization (EUA).  This EUA will remain in effect (meaning this test ca n be used) for the duration of  the COVID-19 declaration under Section 564(b)(1) of the Act, 21 U.S.C. section 360bbb-3(b)(1), unless the authorization is terminated or revoked sooner.     Influenza A by PCR NEGATIVE NEGATIVE Final   Influenza B by PCR NEGATIVE NEGATIVE Final    Comment: (NOTE) The Xpert Xpress SARS-CoV-2/FLU/RSV plus assay is intended as an aid in the diagnosis of influenza from Nasopharyngeal swab specimens and should not be used as a sole basis for treatment. Nasal washings and aspirates are unacceptable for Xpert Xpress SARS-CoV-2/FLU/RSV testing.  Fact Sheet for Patients: EntrepreneurPulse.com.au  Fact Sheet for Healthcare Providers: IncredibleEmployment.be  This test is not yet approved or cleared by the [place] FDA and has been authorized for detection and/or diagnosis of SARS-CoV-2 by FDA under an Emergency Use Authorization (EUA). This EUA will remain in effect (meaning this test can be used) for the duration of the COVID-19 declaration under Section 564(b)(1) of the Act, 21 U.S.C. section 360bbb-3(b)(1), unless the authorization is terminated or revoked.  Performed at [facility], [address], [place], [place] [address]   Blood Culture (routine x 2)     Status: Abnormal   Collection Time:  09/23/21  7:30 PM   Specimen: BLOOD  Result Value Ref Range Status   Specimen Description   Final    BLOOD BLOOD RIGHT HAND Performed at [facility], [address] [place], [place] [address]    Special Requests   Final    BOTTLES DRAWN AEROBIC AND ANAEROBIC Blood Culture adequate volume Performed at [facility], [address] [place], [place] [address]    Culture  Setup Time   Final    GRAM NEGATIVE RODS IN BOTH AEROBIC AND ANAEROBIC BOTTLES Organism ID to follow CRITICAL RESULT CALLED TO, READ BACK BY AND VERIFIED WITH: Ricci Barker M. PHARMD [id number] 09/24/21 HNM GRAM STAIN REVIEWED-AGREE WITH RESULT Performed at [facility], [place] [address] [place], [place] [address]    Culture (A)  Final    ESCHERICHIA COLI Confirmed Extended Spectrum Beta-Lactamase Producer (ESBL).  In bloodstream infections from ESBL organisms, carbapenems are preferred over piperacillin/tazobactam. They are shown to have a lower risk of mortality.    Report Status 09/26/2021 FINAL  Final   Organism ID, Bacteria ESCHERICHIA COLI  Final      Susceptibility   Escherichia coli - MIC*    AMPICILLIN >=32 RESISTANT Resistant  CEFAZOLIN >=64 RESISTANT Resistant     CEFEPIME 16 RESISTANT Resistant     CEFTAZIDIME RESISTANT Resistant     CEFTRIAXONE >=64 RESISTANT Resistant     CIPROFLOXACIN >=4 RESISTANT Resistant     GENTAMICIN <=1 SENSITIVE Sensitive     IMIPENEM <=0.25 SENSITIVE Sensitive     TRIMETH/SULFA <=20 SENSITIVE Sensitive     AMPICILLIN/SULBACTAM 16 INTERMEDIATE Intermediate     PIP/TAZO <=4 SENSITIVE Sensitive     * ESCHERICHIA COLI  Blood Culture ID Panel (Reflexed)     Status: Abnormal   Collection Time: 09/23/21  7:30 PM  Result Value Ref Range Status   Enterococcus faecalis NOT DETECTED NOT DETECTED Final   Enterococcus Faecium NOT DETECTED NOT DETECTED Final   Listeria monocytogenes NOT DETECTED NOT DETECTED Final   Staphylococcus species NOT DETECTED NOT DETECTED  Final   Staphylococcus aureus (BCID) NOT DETECTED NOT DETECTED Final   Staphylococcus epidermidis NOT DETECTED NOT DETECTED Final   Staphylococcus lugdunensis NOT DETECTED NOT DETECTED Final   Streptococcus species NOT DETECTED NOT DETECTED Final   Streptococcus agalactiae NOT DETECTED NOT DETECTED Final   Streptococcus pneumoniae NOT DETECTED NOT DETECTED Final   Streptococcus pyogenes NOT DETECTED NOT DETECTED Final   A.calcoaceticus-baumannii NOT DETECTED NOT DETECTED Final   Bacteroides fragilis NOT DETECTED NOT DETECTED Final   Enterobacterales DETECTED (A) NOT DETECTED Final    Comment: Enterobacterales represent a large order of gram negative bacteria, not a single organism. CRITICAL RESULT CALLED TO, READ BACK BY AND VERIFIED WITH: ELVIRA M. PHARMD [id number] 09/24/21 HNM    Enterobacter cloacae complex NOT DETECTED NOT DETECTED Final   Escherichia coli DETECTED (A) NOT DETECTED Final    Comment: CRITICAL RESULT CALLED TO, READ BACK BY AND VERIFIED WITH: ELVIRA M. PHARMD 0814 09/24/21 HNM    Klebsiella aerogenes NOT DETECTED NOT DETECTED Final   Klebsiella oxytoca NOT DETECTED NOT DETECTED Final   Klebsiella pneumoniae NOT DETECTED NOT DETECTED Final   Proteus species NOT DETECTED NOT DETECTED Final   Salmonella species NOT DETECTED NOT DETECTED Final   Serratia marcescens NOT DETECTED NOT DETECTED Final   Haemophilus influenzae NOT DETECTED NOT DETECTED Final   Neisseria meningitidis NOT DETECTED NOT DETECTED Final   Pseudomonas aeruginosa NOT DETECTED NOT DETECTED Final   Stenotrophomonas maltophilia NOT DETECTED NOT DETECTED Final   Candida albicans NOT DETECTED NOT DETECTED Final   Candida auris NOT DETECTED NOT DETECTED Final   Candida glabrata NOT DETECTED NOT DETECTED Final   Candida krusei NOT DETECTED NOT DETECTED Final   Candida parapsilosis NOT DETECTED NOT DETECTED Final   Candida tropicalis NOT DETECTED NOT DETECTED Final   Cryptococcus neoformans/gattii NOT DETECTED  NOT DETECTED Final   CTX-M ESBL DETECTED (A) NOT DETECTED Final    Comment: CRITICAL RESULT CALLED TO, READ BACK BY AND VERIFIED WITH: ELVIRA M. PHARMD [id number] 09/24/21 HNM (NOTE) Extended spectrum beta-lactamase detected. Recommend a carbapenem as initial therapy.      Carbapenem resistance IMP NOT DETECTED NOT DETECTED Final   Carbapenem resistance KPC NOT DETECTED NOT DETECTED Final   Carbapenem resistance NDM NOT DETECTED NOT DETECTED Final   Carbapenem resist OXA 48 LIKE NOT DETECTED NOT DETECTED Final   Carbapenem resistance VIM NOT DETECTED NOT DETECTED Final    Comment: Performed at [facility], [address] [place], [place] [address]  Blood Culture (routine x 2)     Status: Abnormal   Collection Time: 09/23/21  8:34 PM   Specimen: BLOOD  Result Value  Ref Range Status   Specimen Description   Final    BLOOD BLOOD RIGHT WRIST Performed at [facility], [address] [place], [place] [address]    Special Requests   Final    BOTTLES DRAWN AEROBIC AND ANAEROBIC Blood Culture results may not be optimal due to an inadequate volume of blood received in culture bottles Performed at [facility], [address] [place], [place] [address]    Culture  Setup Time   Final    GRAM NEGATIVE RODS IN BOTH AEROBIC AND ANAEROBIC BOTTLES CRITICAL VALUE NOTED.  VALUE IS CONSISTENT WITH PREVIOUSLY REPORTED AND CALLED VALUE. Performed at [facility], [address] [place], [place] [address]    Culture (A)  Final    ESCHERICHIA COLI SUSCEPTIBILITIES PERFORMED ON PREVIOUS CULTURE WITHIN THE LAST 5 DAYS. Performed at [facility], [place] [address] [place], [place] [address]    Report Status 09/26/2021 FINAL  Final  Culture, blood (Routine X 2) w Reflex to ID Panel     Status: None (Preliminary result)   Collection Time: 09/26/21  6:08 AM   Specimen: BLOOD  Result Value Ref Range Status   Specimen Description BLOOD RIGHT ANTECUBITAL  Final    Special Requests   Final    BOTTLES DRAWN AEROBIC AND ANAEROBIC Blood Culture adequate volume   Culture   Final    NO GROWTH < 24 HOURS Performed at [facility], [address], [place], [place] [address]    Report Status PENDING  Incomplete  Culture, blood (Routine X 2) w Reflex to ID Panel     Status: None (Preliminary result)   Collection Time: 09/26/21  6:29 AM   Specimen: BLOOD  Result Value Ref Range Status   Specimen Description BLOOD BLOOD RIGHT HAND  Final   Special Requests   Final    BOTTLES DRAWN AEROBIC AND ANAEROBIC Blood Culture adequate volume   Culture   Final    NO GROWTH < 24 HOURS Performed at [facility], [address] [place], [place] [address]    Report Status PENDING  Incomplete         Radiology Studies last 96 hours: US RENAL  Result Date: 09/25/2021 CLINICAL DATA:  Hydronephrosis EXAM: RENAL / URINARY TRACT ULTRASOUND COMPLETE COMPARISON:  08/19/2021 FINDINGS: Right Kidney: Renal measurements: 10.5 x 5.7 x 6.2 cm = volume: 196 mL. Echogenicity within normal limits. No mass or hydronephrosis visualized. Left Kidney: Renal measurements: 10.8 x 5.8 x 5.8 cm = volume: 190.7 mL. Echogenicity within normal limits. No hydronephrosis. Exophytic 1.9 cm cyst off the interpolar region. Bladder: Appears normal for degree of bladder distention. Other: None. IMPRESSION: 1. No evidence of hydronephrosis or nephrolithiasis. 2. Benign left renal cyst. Otherwise unremarkable appearance of the kidneys. Electronically Signed   By: Randa Ngo M.D.   On: 09/25/2021 21:56   US Abdomen Limited RUQ (LIVER/GB)  Result Date: 09/23/2021 CLINICAL DATA:  Elevated LFTs EXAM: ULTRASOUND ABDOMEN LIMITED RIGHT UPPER QUADRANT COMPARISON:  01/06/2021 FINDINGS: Gallbladder: Surgically removed Common bile duct: Diameter: 5.4 mm. Liver: No focal lesion identified. Within normal limits in parenchymal echogenicity. 3 mm calcification is noted in the right lobe of the liver  stable from prior CT. Portal vein is patent on color Doppler imaging with normal direction of blood flow towards the liver. Other: None. IMPRESSION: Status post cholecystectomy. No acute abnormality to correspond with the given clinical history is noted. Electronically Signed   By: Elta Guadeloupe  Lukens M.D.   On: 09/23/2021 23:30   DG Chest Port 1 View  Result Date: 09/23/2021 CLINICAL DATA:  A 76 year old female presents with questionable sepsis. EXAM: PORTABLE CHEST 1 VIEW COMPARISON:  September 07, 2021. FINDINGS: EKG leads project over the chest. RIGHT-sided Port-A-Cath terminates at the caval to atrial junction. Cardiomediastinal contours and hilar structures are stable. Lungs are clear. No visible pneumothorax. On limited assessment there is no acute skeletal finding. IMPRESSION: 1. No active disease. 2. RIGHT-sided Port-A-Cath terminates at the caval to atrial junction. 3. Known adenopathy in the chest and lower neck better seen on prior PET imaging Electronically Signed   By: Zetta Bills M.D.   On: 09/23/2021 19:49            LOS: 4 days     Emeterio Reeve, DO [facility] 09/27/2021, 11:47 AM   Staff may message me via secure chat in [place]  but this may not receive immediate response,  please page for urgent matters!  If 7PM-7AM, please contact night-coverage [email or web address]  Dictation software was used to generate the above note. Typos may occur and escape review, as with typed/written notes. Please contact Dr Sheppard Coil directly for clarity if needed.

## 2021-09-27 NOTE — Evaluation (Signed)
Occupational Therapy Evaluation Patient Details Name: Stacey Stone Stone MRN: [id number] DOB: [birth year] Today's Date: 09/27/2021   History of Present Illness Pt is a 76 y/o F admitted on 09/23/21 after presenting from the [facility] with genearlized malaise, weakness, lethargy & fever x 24 hours. Pt found to be covid+. pt being treated for sepsis with sources including UTI, COVID, & other sources in view of immunosuppression from chemotherapy. 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 Until last month, pt was IND in fxl mobility, driving, working full time. In June, she experienced weakness, fatigue, and functional decline, was diagnosed with diffuse large B-cell lymphoma, and began chemotherapy. She reports 1 recent fall, immediately after chemotherapy, but has no prior falls history. She also states that she has increasingly begun holding on to furniture or walls when walking. During today's evaluation, pt trialed use of a RW, with significantly improved balance using this AD. She was able to transfer sit<>stand, ambulate to bathroom, perform toileting, grooming in standing, LB dressing, all with SUPV for safety. Pt displayed no LOB, demonstrated good safety awareness. Recommend DC home with a RW, but no additional OT services needed at this time. Pt in agreement.    Recommendations for follow up therapy are one component of a multi-disciplinary discharge planning process, led by the attending physician.  Recommendations may be updated based on patient status, additional functional criteria and insurance authorization.   Follow Up Recommendations  No OT follow up    Assistance Recommended at Discharge PRN  Patient can return home with the  following Assist for transportation    Functional Status Assessment  Patient has had a recent decline in their functional status and demonstrates the ability to make significant improvements in function in a reasonable and predictable amount of time.  Equipment Recommendations  Other (comment) (RW)    Recommendations for Other Services       Precautions / Restrictions Precautions Precautions: Fall Precaution Comments: Mod fall risk Restrictions Weight Bearing Restrictions: No      Mobility Bed Mobility               General bed mobility comments: pt received, left in recliner    Transfers Overall transfer level: Needs assistance   Transfers: Sit to/from Stand, Bed to chair/wheelchair/BSC Sit to Stand: Modified independent (Device/Increase time)     Step pivot transfers: Supervision            Balance Overall balance assessment: Needs assistance Sitting-balance support: Feet supported, Bilateral upper extremity supported Sitting balance-Leahy Scale: Good     Standing balance support: During functional activity, No upper extremity supported Standing balance-Leahy Scale: Fair                             ADL either performed or assessed with clinical judgement   ADL Overall ADL's : Needs assistance/impaired     Grooming: Wash/dry hands;Wash/dry face;Standing;Oral care;Supervision/safety               Lower Body Dressing: Independent   Toilet Transfer: Supervision/safety   Toileting- Water quality scientist and Hygiene: Supervision/safety       Functional mobility during ADLs: Supervision/safety General ADL Comments: SUPV for safety with RW  Vision         Perception     Praxis      Pertinent Vitals/Pain Pain Assessment Pain Assessment: No/denies pain     Hand Dominance     Extremity/Trunk Assessment Upper Extremity Assessment Upper Extremity Assessment: Overall WFL for tasks assessed   Lower Extremity  Assessment Lower Extremity Assessment: Overall WFL for tasks assessed       Communication Communication Communication: No difficulties   Cognition Arousal/Alertness: Awake/alert Behavior During Therapy: WFL for tasks assessed/performed Overall Cognitive Status: Within Functional Limits for tasks assessed                                 General Comments: very pleasant, good safety awareness     General Comments  SpO2 97% on room air    Exercises Other Exercises Other Exercises: Educ re: falls prevention, safe use of RW   Shoulder Instructions      Home Living Family/patient expects to be discharged to:: Private residence Living Arrangements: Children Available Help at Discharge: Family;Available PRN/intermittently Type of Home:  (townhouse) Home Access: Stairs to enter [employer] of Steps: 1 threshold step Entrance Stairs-Rails: None Home Layout: Two level;Able to live on main level with bedroom/bathroom     Bathroom Shower/Tub: Occupational psychologist: Standard     Home Equipment: Cane - single point;Shower seat - built in          Prior Functioning/Environment Prior Level of Function : Independent/Modified Independent;Working/employed;Driving             Mobility Comments: Pt & family report pt has been experiencing significant decline since beginning of June, furniture walking & not driving but prior to that pt was ambulatory without AD, driving, working in Engineer, 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 Pt endorses 1 fall after vomiting 2/2 CA tx (possibly 2/2 feeling weak afterwards)          OT Problem List: Decreased strength;Decreased activity tolerance;Impaired balance (sitting and/or standing)      OT Treatment/Interventions:      OT Goals(Current goals can be found in the care plan section) Acute Rehab OT Goals Patient Stated Goal: to attend granddaughter's wedding in Dec OT Goal Formulation: With  patient Time For Goal Achievement: 10/11/21 Potential to Achieve Goals: Good  OT Frequency:      Co-evaluation              AM-PAC OT "6 Clicks" Daily Activity     Outcome Measure Help from another person eating meals?: None Help from another person taking care of personal grooming?: None Help from another person toileting, which includes using toliet, bedpan, or urinal?: None Help from another person bathing (including washing, rinsing, drying)?: None Help from another person to put on and taking off regular upper body clothing?: None Help from another person to put on and taking off regular lower body clothing?: None 6 Click Score: 24   End of Session Equipment Utilized During Treatment: Rolling walker (2 wheels)  Activity Tolerance: Patient tolerated treatment well Patient left: in chair;with call bell/phone within reach  OT Visit Diagnosis: Muscle weakness (generalized) (M62.81)                Time: 1037-1100 OT Time Calculation (min): 23 min Charges:  OT General Charges $OT Visit: 1 Visit OT Evaluation $OT Eval Low Complexity: 1 Low OT Treatments $Self Care/Home Management : 23-37 mins Josiah Lobo,  PhD, MS, OTR/L 09/27/21, 11:16 AM

## 2021-09-28 DIAGNOSIS — A498 Other bacterial infections of unspecified site: Secondary | ICD-10-CM | POA: Diagnosis not present

## 2021-09-28 DIAGNOSIS — R7881 Bacteremia: Secondary | ICD-10-CM | POA: Diagnosis not present

## 2021-09-28 DIAGNOSIS — A419 Sepsis, unspecified organism: Secondary | ICD-10-CM | POA: Diagnosis not present

## 2021-09-28 DIAGNOSIS — Z1612 Extended spectrum beta lactamase (ESBL) resistance: Secondary | ICD-10-CM | POA: Diagnosis not present

## 2021-09-28 DIAGNOSIS — I2510 Atherosclerotic heart disease of native coronary artery without angina pectoris: Secondary | ICD-10-CM | POA: Diagnosis not present

## 2021-09-28 DIAGNOSIS — C8330 Diffuse large B-cell lymphoma, unspecified site: Secondary | ICD-10-CM | POA: Diagnosis not present

## 2021-09-28 DIAGNOSIS — R7989 Other specified abnormal findings of blood chemistry: Secondary | ICD-10-CM | POA: Diagnosis not present

## 2021-09-28 DIAGNOSIS — U071 COVID-19: Secondary | ICD-10-CM | POA: Diagnosis not present

## 2021-09-28 DIAGNOSIS — B9620 Unspecified Escherichia coli [E. coli] as the cause of diseases classified elsewhere: Secondary | ICD-10-CM

## 2021-09-28 LAB — CBC
HCT: 30.4 % — ABNORMAL LOW (ref 36.0–46.0)
Hemoglobin: 9.5 g/dL — ABNORMAL LOW (ref 12.0–15.0)
MCH: 23.2 pg — ABNORMAL LOW (ref 26.0–34.0)
MCHC: 31.3 g/dL (ref 30.0–36.0)
MCV: 74.1 fL — ABNORMAL LOW (ref 80.0–100.0)
Platelets: 62 10*3/uL — ABNORMAL LOW (ref 150–400)
RBC: 4.1 MIL/uL (ref 3.87–5.11)
RDW: 16 % — ABNORMAL HIGH (ref 11.5–15.5)
WBC: 0.4 10*3/uL — CL (ref 4.0–10.5)
nRBC: 0 % (ref 0.0–0.2)

## 2021-09-28 LAB — BASIC METABOLIC PANEL
Anion gap: 6 (ref 5–15)
BUN: 11 mg/dL (ref 8–23)
CO2: 26 mmol/L (ref 22–32)
Calcium: 8 mg/dL — ABNORMAL LOW (ref 8.9–10.3)
Chloride: 101 mmol/L (ref 98–111)
Creatinine, Ser: 0.52 mg/dL (ref 0.44–1.00)
GFR, Estimated: >60 mL/min (ref >60)
Glucose, Bld: 99 mg/dL (ref 70–99)
Potassium: 3.9 mmol/L (ref 3.5–5.1)
Sodium: 133 mmol/L — ABNORMAL LOW (ref 135–145)

## 2021-09-28 LAB — MAGNESIUM: Magnesium: 2.1 mg/dL (ref 1.7–2.4)

## 2021-09-28 MED ORDER — ERTAPENEM SODIUM 1 G IJ SOLR
1.0000 g | INTRAMUSCULAR | Status: DC
  Administered 2021-09-28: 1000 mg via INTRAVENOUS
  Filled 2021-09-28: qty 1

## 2021-09-28 MED ORDER — POTASSIUM CHLORIDE CRYS ER 20 MEQ PO TBCR
40.0000 meq | EXTENDED_RELEASE_TABLET | Freq: Once | ORAL | Status: AC
  Administered 2021-09-28: 40 meq via ORAL
  Filled 2021-09-28: qty 2

## 2021-09-28 MED ORDER — ERTAPENEM IV (FOR PTA / DISCHARGE USE ONLY): 1.0000 g | INTRAVENOUS | 0 refills | Status: AC

## 2021-09-28 MED ORDER — PHENOL 1.4 % MT LIQD
1.0000 | OROMUCOSAL | Status: DC | PRN
Start: 2021-09-28 — End: 2021-09-28
  Filled 2021-09-28: qty 177

## 2021-09-28 MED ORDER — AMLODIPINE BESYLATE 5 MG PO TABS
5.0000 mg | ORAL_TABLET | Freq: Every day | ORAL | Status: DC
  Administered 2021-09-28: 5 mg via ORAL
  Filled 2021-09-28: qty 1

## 2021-09-28 MED ORDER — SODIUM CHLORIDE 0.9 % IV SOLN: 1.0000 g | INTRAVENOUS | Status: DC

## 2021-09-28 MED ORDER — AMLODIPINE BESYLATE 10 MG PO TABS: 10.0000 mg | ORAL_TABLET | Freq: Every day | ORAL | 0 refills | Status: DC

## 2021-09-28 NOTE — Plan of Care (Signed)
  Problem: Education: Goal: Knowledge of risk factors and measures for prevention of condition will improve Outcome: Progressing   Problem: Coping: Goal: Psychosocial and spiritual needs will be supported Outcome: Progressing   Problem: Respiratory: Goal: Will maintain a patent airway Outcome: Progressing Goal: Complications related to the disease process, condition or treatment will be avoided or minimized Outcome: Progressing   Problem: Fluid Volume: Goal: Hemodynamic stability will improve Outcome: Progressing   Problem: Clinical Measurements: Goal: Diagnostic test results will improve Outcome: Progressing Goal: Signs and symptoms of infection will decrease Outcome: Progressing   Problem: Respiratory: Goal: Ability to maintain adequate ventilation will improve Outcome: Progressing   Problem: Education: Goal: Knowledge of General Education information will improve Description: Including pain rating scale, medication(s)/side effects and non-pharmacologic comfort measures Outcome: Progressing   Problem: Health Behavior/Discharge Planning: Goal: Ability to manage health-related needs will improve Outcome: Progressing   Problem: Clinical Measurements: Goal: Ability to maintain clinical measurements within normal limits will improve Outcome: Progressing Goal: Will remain free from infection Outcome: Progressing Goal: Diagnostic test results will improve Outcome: Progressing Goal: Respiratory complications will improve Outcome: Progressing Goal: Cardiovascular complication will be avoided Outcome: Progressing   Problem: Activity: Goal: Risk for activity intolerance will decrease Outcome: Progressing   Problem: Nutrition: Goal: Adequate nutrition will be maintained Outcome: Progressing   Problem: Coping: Goal: Level of anxiety will decrease Outcome: Progressing   Problem: Elimination: Goal: Will not experience complications related to bowel motility Outcome:  Progressing Goal: Will not experience complications related to urinary retention Outcome: Progressing   Problem: Pain Managment: Goal: General experience of comfort will improve Outcome: Progressing   Problem: Safety: Goal: Ability to remain free from injury will improve Outcome: Progressing   Problem: Skin Integrity: Goal: Risk for impaired skin integrity will decrease Outcome: Progressing   Problem: Urinary Elimination: Goal: Signs and symptoms of infection will decrease Outcome: Progressing

## 2021-09-28 NOTE — Discharge Summary (Signed)
Physician Discharge Summary   Patient: Stacey Stone MRN: [id number]  DOB: 1945-04-22   Admit:     Date of Admission: 09/23/2021 Admitted from: home   Discharge: Date of discharge: 09/28/21 Disposition: Home Condition at discharge: fair  CODE STATUS: FULL     Discharge Physician: Emeterio Reeve, DO [facility]     PCP: Rusty Aus, MD  Recommendations for Outpatient Follow-up:  Follow up with PCP Rusty Aus, MD in 1-2 weeks Please obtain labs/tests: CBC, BMP in 1-2 weeks Please follow up on the following pending results: finalized blood cultures (preliminary cultures are negative)  Follow as directed w/ hematology / oncology.    Discharge Instructions     [facility] pharmacist to adjust dose for Vancomycin, Aminoglycosides and other anti-infective therapies as requested by physician.   Complete by: As directed    [facility] to provide Cath Flo 2m   Complete by: As directed    Administer for PICC line occlusion and as ordered by physician for other access device issues.   Anaphylaxis Kit: Provided to treat any anaphylactic reaction to the medication being provided to the patient if First Dose or when requested by physician   Complete by: As directed    Epinephrine 19mml vial / amp: Administer 0.62m4m0.62ml35mubcutaneously once for moderate to severe anaphylaxis, nurse to call physician and pharmacy when reaction occurs and call 911 if needed for immediate care   Diphenhydramine 50mg25mIV vial: Administer 25-50mg 68mM PRN for first dose reaction, rash, itching, mild reaction, nurse to call physician and pharmacy when reaction occurs   Sodium Chloride 0.9% NS 500ml I2mdminister if needed for hypovolemic blood pressure drop or as ordered by physician after call to physician with anaphylactic reaction   Change dressing on IV access line weekly and PRN   Complete by: As directed    Diet - low sodium heart healthy    Complete by: As directed    Discharge instructions   Complete by: As directed    Restart eliquis in 2-3 days if no additional bleeding.  Stop aspirin until or unless told to start this again.  All other medications the same with exception of new IV antibiotics.   Please follow up with your PCP within 1-2 weeks.  Please follow up with cancer center as directed.   Discharge wound care:   Complete by: As directed    Can use the splint and gauze to protect the nail/finger. This nail will likely fall off at some point.   Face-to-face encounter (required for Medicare/Medicaid patients)   Complete by: As directed    I NatalieEmeterio Reevey that this patient is under my care and that I, or a nurse practitioner or physician's assistant working with me, had a face-to-face encounter that meets the physician face-to-face encounter requirements with this patient on 09/28/2021. The encounter with the patient was in whole, or in part for the following medical condition(s) which is the primary reason for home health care (List medical condition): cancer, neutropenic fever, sepsis, bacteremia   The encounter with the patient was in whole, or in part, for the following medical condition, which is the primary reason for home health care: cancer, neutropenic fever, sepsis, bacteremia   I certify that, based on my findings, the following services are medically necessary home health services: Physical therapy   Reason for Medically Necessary Home Health Services: Therapy- Therapeutic Exercises to Increase Strength and Endurance   My  clinical findings support the need for the above services: Unable to leave home safely without assistance and/or assistive device   Further, I certify that my clinical findings support that this patient is homebound due to: Unable to leave home safely without assistance   Flush IV access with Sodium Chloride 0.9% and Heparin 10 units/ml or 100 units/ml   Complete by: As directed     Home Health   Complete by: As directed    To provide the following care/treatments:  PT RN     Home infusion instructions - [facility]   Complete by: As directed    Instructions: Flush IV access with Sodium Chloride 0.9% and Heparin 10units/ml or 100units/ml   Change dressing on IV access line: Weekly and PRN   Instructions Cath Flo 64m: Administer for PICC Line occlusion and as ordered by physician for other access device   [facility] pharmacist to adjust dose for: Vancomycin, Aminoglycosides and other anti-infective therapies as requested by physician   Increase activity slowly   Complete by: As directed    Method of administration may be changed at the discretion of home infusion pharmacist based upon assessment of the patient and/or caregiver's ability to self-administer the medication ordered   Complete by: As directed         Hospital Course:  SJANNELY HENTHORNis a 76y.o. 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 She was sent to ED 09/23/2021 from the cancer center with generalized malaise, weakness, lethargy and a fever over the past 24 hours. She was given a dose of Rocephin for suspected UTI.  No culture results available. Mild cough.  Denies nausea, vomiting or abdominal pain.  07/05: ED course and early admission: Tmax 101.2, pulse 105, respirations 22 with BP 181/96 and O2 sat 97% on room air. Labs with WBC less than 0.1 with neutrophil percent 29%, hemoglobin 10 and platelets 90,000.  Lactic acid 2.4.  Potassium 3.2.  LFTs elevated with AST 124, ALT 140, alk phos 277 and total bili 1.5.  COVID PCR(+), UA c/w UTI. CXR no concerns. Patient treated with Rocephin, vancomycin and Flagyl as well as azithromycin, started on sepsis fluids and  hospitalist consulted for admission for sepsis d/t UTI, COVID, other potential source d/t immune compromise, also neutropenic fever likely d/t above Added remdesevir.  07/06: continuing anti-infectives (meropenem, vancomycin, remdesevir), (+)blood cultures ESBL, consulted ID. Vanc d/c. Hyponatremia resolved. Hypokalemia repleted. AST/ALT trending down but still elevated. Lactate trending down. Still meeting sepsis criteria d/w febrile early this morning (improved), tachycardia (improved), tachypnea, low WBC. 07/07: VS and WBC improved, not meeting sepsis criteria though WBC still quite low. Bleeding from R middle finger nail - she bent this back w/ fall at home. Pressure bandage applied and also used QuikClot dressing and splint. Renal [place] concerns. ID following. LFT trending down.   07/08: VSS. AM labs mild hyponatremia, mild hypokalemia, WBC same at 0.2 compared to yesterday, hemoglobin stable at 9.0. Blood culture sensitivities resulted. ID following peripherally through the weekend. Repeat BCx drawn today.  07/09: VSS. AM labs slight decrease Plt to 38, WBC stable 0.2, sodium improving. Repeat blood cultures NG <24h. Remains on Meropenem and Remdesevir. 07/10: repeat BCx no growth x2 days. Today is Day 5 remdesevir. Plan for discharge home on IV abx  per ID as above. Plt improving but ASA still held on discharge until follow w/ PCP/HemOnc    Consultants:  ID   Procedures: none       Subjective: Patient reports feeling significantly better today, more energetic.  Bandage on finger has not oozed/bled.           Discharge Diagnoses: Principal Problem:   Sepsis ([place]) Active Problems:   Neutropenic fever ([place])   Urinary tract infection   COVID-19 virus infection   Abnormal LFTs   DLBCL (diffuse large B cell lymphoma) (HCC)   Pancytopenia, chemotherapy induced(HCC)   Coronary artery disease   Essential hypertension   Hypothyroidism   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HCC)   OSA on CPAP    Paroxysmal atrial fibrillation (HCC)   History of breast cancer   Bacteremia   Finger nail contusion, initial encounter   Bacteremia due to ESBL-producing Escherichia coli source UTI    Assessment & Plan:  Sepsis ([place]) Resolved Treating UTI, bacteremia d/t UTI, COVID   Neutropenic fever ([place]) POA, resolved ANC of 29 based on WBC less than 0.1 and neutrophil percent 29% Likely source UTI and patient also COVID-positive ID following  Urinary tract infection cultures:  ESBL on initial cultures Urine/blood.  Repeat cultures drawn, negative thus far (2 days)  Continuing meropenem at home ID has followed closely   COVID-19 virus infection Suspecting incidental COVID as patien symptomatic only for mild cough and chest x-ray clear Potential to get worse because of recent Chemo with R-[facility]OP and rituximab can make covid worse Airborne precautions and neutropenic precautions Remdesivir x5 days total - last day is today 09/28/21  albuterol, antitussives prn but has been asymptomatic   Pancytopenia, chemotherapy induced(HCC) Monitor cell lines: WBC Hgb stabl, Plt down a bit Levels improving Follow w/ HemOnc   DLBCL (diffuse large B cell lymphoma) ([place]) Started on first cycle chemo on 6/29  Coronary artery disease No complaints of chest pain Continue atorvastatin, isosorbide, sotalol  nitroglycerin as needed  Essential hypertension Was mildly elevated on arrival, now controlled Continue sotalol, amlodipine Monitor for hypotension in view of sepsis diagnosis  Hypothyroidism Continue levothyroxine  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[place]) Diagnosed in 2017 followed by oncology  OSA on CPAP CPAP nightly  Paroxysmal atrial fibrillation (HCC) Continue sotalol  Apixiban held d/t finger bleed  Abnormal LFTs LFTs elevated with AST 124, ALT 140, alk phos 277 and total bili 1.5 Patient had prior cholecystectomy Hep panel negative right upper quadrant ultrasound negative May be d/t chemo,  follow periodic CMP  History of breast cancer Finishing up letrozole this month.  [name]  Finger nail contusion, initial encounter L 3rd digit, nail bent back fall at home, wont't stop oozing now that clotting bandage has worn off Pressure bandage placed w/ QuikClot dressing and splint over finger 09/25/2021 D/c Eliquis 07/07 given this bleed and low Plt, placed SCD    Bacteremia due to ESBL-producing Escherichia coli source UTI ESBL E.coli UTI Renal US: No evidence of hydronephrosis or nephrolithiasis. Benign left renal cyst Bladder scan for post void measurement Abx as above pending repeat culture, should have results tomorrow 07/10 ID following        Discharge Instructions  Allergies as of 09/28/2021       Reactions   Codeine Other (See Comments)   Stroke like symptoms   Tramadol Itching        Medication List     STOP taking these medications    acidophilus Caps capsule  aspirin EC 81 MG tablet       TAKE these medications    acetaminophen 500 MG tablet Commonly known as: TYLENOL Take 500 mg by mouth every 4 (four) hours as needed.   allopurinol 300 MG tablet Commonly known as: ZYLOPRIM Take 1 tablet (300 mg total) by mouth daily.   amLODipine 10 MG tablet Commonly known as: NORVASC Take 1 tablet (10 mg total) by mouth daily. What changed:  medication strength how much to take   apixaban 5 MG Tabs tablet Commonly known as: ELIQUIS Take 5 mg by mouth 2 (two) times daily.   atorvastatin 40 MG tablet Commonly known as: LIPITOR Take 1 tablet (40 mg total) by mouth at bedtime.   diltiazem 30 MG tablet Commonly known as: CARDIZEM Take 30 mg by mouth 2 (two) times daily as needed (for breakthrough afib).   ertapenem  IVPB Commonly known as: INVANZ Inject 1 g into the vein daily for 4 days. Indication:  ESBL E coli bacteremia due to UTI First Dose: Yes Last Day of Therapy:  10/03/2021 Labs - Once weekly:  CBC/D and CMP Fax  weekly lab results  promptly to [phone] Method of administration: Mini-Bag Plus / Gravity Method of administration may be changed at the discretion of home infusion pharmacist based upon assessment of the patient and/or caregiver's ability to self-administer the medication ordered. Start taking on: September 29, 2021   famotidine 40 MG tablet Commonly known as: PEPCID Take 40 mg by mouth at bedtime.   ferrous sulfate 325 (65 FE) MG tablet Take 325 mg by mouth once a week.   Fluticasone-Salmeterol 100-50 MCG/DOSE Aepb Commonly known as: ADVAIR Inhale 1 puff into the lungs 2 (two) times daily as needed (for asthma.).   isosorbide mononitrate 30 MG 24 hr tablet Commonly known as: IMDUR Take 30 mg by mouth daily.   letrozole 2.5 MG tablet Commonly known as: FEMARA TAKE 1 TABLET BY MOUTH ONCE DAILY   levothyroxine 125 MCG tablet Commonly known as: SYNTHROID Take 125 mcg by mouth daily.   lidocaine-prilocaine cream Commonly known as: EMLA Apply to affected area once   magic mouthwash (nystatin, hydrocortisone, diphenhydrAMINE) suspension Swish and spit 5 mLs 3 (three) times daily as needed for mouth pain.   nitroGLYCERIN 0.4 MG SL tablet Commonly known as: NITROSTAT Place 0.4 mg under the tongue every 5 (five) minutes as needed for chest pain. Maximum 3 doses, If no relief call md or 911.   ondansetron 8 MG tablet Commonly known as: Zofran Take 1 tablet (8 mg total) by mouth 2 (two) times daily as needed for refractory nausea / vomiting. Start on day 3 after cyclophosphamide chemotherapy.   potassium chloride 10 MEQ tablet Commonly known as: KLOR-CON Take 1 tablet (10 mEq total) by mouth 2 (two) times daily.   predniSONE 20 MG tablet Commonly known as: DELTASONE Take 5 tablets (100 mg total) by mouth daily. Take with food on days 1-5 of chemotherapy.   prochlorperazine 10 MG tablet Commonly known as: COMPAZINE Take 1 tablet (10 mg total) by mouth every 6 (six) hours  as needed (Nausea or vomiting).   RABEprazole 20 MG tablet Commonly known as: ACIPHEX Take 20 mg by mouth daily.   sertraline 50 MG tablet Commonly known as: ZOLOFT Take 50 mg by mouth at bedtime.   sotalol 80 MG tablet Commonly known as: BETAPACE Take 80 mg by mouth 2 (two) times daily.   sulfamethoxazole-trimethoprim 800-160 MG tablet Commonly known as: BACTRIM DS  Take 1 tablet by mouth 2 (two) times daily for 5 days.   torsemide 20 MG tablet Commonly known as: DEMADEX Take 10 mg by mouth as directed. Take on Tuesday and Friday               Discharge Care Instructions  (From admission, onward)           Start     Ordered   09/28/21 0000  Discharge wound care:       Comments: Can use the splint and gauze to protect the nail/finger. This nail will likely fall off at some point.   09/28/21 1315   09/28/21 0000  Change dressing on IV access line weekly and PRN  (Home infusion instructions - [facility] )        09/28/21 1311             Follow-up Information     Rusty Aus, MD. Schedule an appointment as soon as possible for a visit in 1 week(s).   Specialty: Internal Medicine Contact information: [place] [address] [phone]                 Allergies  Allergen Reactions   Codeine Other (See Comments)    Stroke like symptoms   Tramadol Itching       Discharge Exam: Vitals:   09/28/21 0042 09/28/21 0646  BP: (!) 189/95 (!) 170/85  Pulse: 86 72  Resp: 18 18  Temp: 99.4 F (37.4 C) 98.1 F (36.7 C)  SpO2: 97% 96%   Vitals:   09/27/21 1509 09/27/21 2121 09/28/21 0042 09/28/21 0646  BP: (!) 151/84 (!) 161/88 (!) 189/95 (!) 170/85  Pulse: 69 80 86 72  Resp: _0 Temp: 98.5 F (36.9 C) 98.8 F (37.1 C) 99.4 F (37.4 C) 98.1 F (36.7 C)  TempSrc: Oral Oral Oral Oral  SpO2: 100% 96% 97% 96%  Weight:      Height:         General: Pt is  alert, awake, not in acute distress Cardiovascular: RRR, S1/S2 +, no rubs, no gallops Respiratory: CTA bilaterally, no wheezing, no rhonchi Abdominal: Soft, NT, ND, bowel sounds + Extremities: no edema, no cyanosis     The results of significant diagnostics from this hospitalization (including imaging, microbiology, ancillary and laboratory) are listed below for reference.     Microbiology: Recent Results (from the past 240 hour(s))  Urine culture     Status: Abnormal   Collection Time: 09/23/21  2:55 PM   Specimen: Urine, Random  Result Value Ref Range Status   Specimen Description   Final    URINE, RANDOM Performed at [facility], [address], [place], [place] [address]    Special Requests   Final    NONE Performed at [facility], [address] [place], [place] [address]    Culture (A)  Final    >=100,000 COLONIES/mL ESCHERICHIA COLI Confirmed Extended Spectrum Beta-Lactamase Producer (ESBL).  In bloodstream infections from ESBL organisms, carbapenems are preferred over piperacillin/tazobactam. They are shown to have a lower risk of mortality.    Report Status 09/26/2021 FINAL  Final   Organism ID, Bacteria ESCHERICHIA COLI (A)  Final      Susceptibility   Escherichia coli - MIC*    AMPICILLIN >=32 RESISTANT Resistant     CEFAZOLIN >=64 RESISTANT Resistant     CEFEPIME >=32 RESISTANT Resistant  CEFTRIAXONE >=64 RESISTANT Resistant     CIPROFLOXACIN >=4 RESISTANT Resistant     GENTAMICIN <=1 SENSITIVE Sensitive     IMIPENEM <=0.25 SENSITIVE Sensitive     NITROFURANTOIN 32 SENSITIVE Sensitive     TRIMETH/SULFA <=20 SENSITIVE Sensitive     AMPICILLIN/SULBACTAM >=32 RESISTANT Resistant     PIP/TAZO <=4 SENSITIVE Sensitive     * >=100,000 COLONIES/mL ESCHERICHIA COLI  Urine Culture     Status: Abnormal   Collection Time: 09/23/21  7:15 PM   Specimen: In/Out Cath Urine  Result Value Ref Range Status   Specimen Description   Final    IN/OUT CATH  URINE Performed at [facility], [address] [place], [place] [address]    Special Requests   Final    NONE Performed at [facility], [address] [place], [place] [address]    Culture (A)  Final    >=100,000 COLONIES/mL ESCHERICHIA COLI Confirmed Extended Spectrum Beta-Lactamase Producer (ESBL).  In bloodstream infections from ESBL organisms, carbapenems are preferred over piperacillin/tazobactam. They are shown to have a lower risk of mortality.    Report Status 09/26/2021 FINAL  Final   Organism ID, Bacteria ESCHERICHIA COLI (A)  Final      Susceptibility   Escherichia coli - MIC*    AMPICILLIN >=32 RESISTANT Resistant     CEFAZOLIN >=64 RESISTANT Resistant     CEFEPIME >=32 RESISTANT Resistant     CEFTRIAXONE >=64 RESISTANT Resistant     CIPROFLOXACIN >=4 RESISTANT Resistant     GENTAMICIN <=1 SENSITIVE Sensitive     IMIPENEM <=0.25 SENSITIVE Sensitive     NITROFURANTOIN 32 SENSITIVE Sensitive     TRIMETH/SULFA <=20 SENSITIVE Sensitive     AMPICILLIN/SULBACTAM >=32 RESISTANT Resistant     PIP/TAZO <=4 SENSITIVE Sensitive     * >=100,000 COLONIES/mL ESCHERICHIA COLI  Resp Panel by RT-PCR (Flu A&B, Covid) Anterior Nasal Swab     Status: Abnormal   Collection Time: 09/23/21  7:30 PM   Specimen: Anterior Nasal Swab  Result Value Ref Range Status   SARS Coronavirus 2 by RT PCR POSITIVE (A) NEGATIVE Final    Comment: RESULT CALLED TO, READ BACK BY AND VERIFIED WITH: ASHLEY ORSUTO AT 2057 ON 09/23/21 BY SS (NOTE) SARS-CoV-2 target nucleic acids are DETECTED.  The SARS-CoV-2 RNA is generally detectable in upper respiratory specimens during the acute phase of infection. Positive results are indicative of the presence of the identified virus, but do not rule out bacterial infection or co-infection with other pathogens not detected by the test. Clinical correlation with patient history and other diagnostic information is necessary to determine  patient infection status. The expected result is Negative.  Fact Sheet for Patients: EntrepreneurPulse.com.au  Fact Sheet for Healthcare Providers: IncredibleEmployment.be  This test is not yet approved or cleared by the [place] FDA and  has been authorized for detection and/or diagnosis of SARS-CoV-2 by FDA under an Emergency Use Authorization (EUA).  This EUA will remain in effect (meaning this test ca n be used) for the duration of  the COVID-19 declaration under Section 564(b)(1) of the Act, 21 U.S.C. section 360bbb-3(b)(1), unless the authorization is terminated or revoked sooner.     Influenza A by PCR NEGATIVE NEGATIVE Final   Influenza B by PCR NEGATIVE NEGATIVE Final    Comment: (NOTE) The Xpert Xpress SARS-CoV-2/FLU/RSV plus assay is intended as an aid in the diagnosis of influenza from Nasopharyngeal swab specimens and should not be used as a sole  basis for treatment. Nasal washings and aspirates are unacceptable for Xpert Xpress SARS-CoV-2/FLU/RSV testing.  Fact Sheet for Patients: EntrepreneurPulse.com.au  Fact Sheet for Healthcare Providers: IncredibleEmployment.be  This test is not yet approved or cleared by the [place] FDA and has been authorized for detection and/or diagnosis of SARS-CoV-2 by FDA under an Emergency Use Authorization (EUA). This EUA will remain in effect (meaning this test can be used) for the duration of the COVID-19 declaration under Section 564(b)(1) of the Act, 21 U.S.C. section 360bbb-3(b)(1), unless the authorization is terminated or revoked.  Performed at [facility], [address] [place], [place] [address]   Blood Culture (routine x 2)     Status: Abnormal   Collection Time: 09/23/21  7:30 PM   Specimen: BLOOD  Result Value Ref Range Status   Specimen Description   Final    BLOOD BLOOD RIGHT HAND Performed at [facility],  [address] [place], [place] [address]    Special Requests   Final    BOTTLES DRAWN AEROBIC AND ANAEROBIC Blood Culture adequate volume Performed at [facility], [address] [place], [place] [address]    Culture  Setup Time   Final    GRAM NEGATIVE RODS IN BOTH AEROBIC AND ANAEROBIC BOTTLES Organism ID to follow CRITICAL RESULT CALLED TO, READ BACK BY AND VERIFIED WITH: Ricci Barker M. PHARMD [id number] 09/24/21 HNM GRAM STAIN REVIEWED-AGREE WITH RESULT Performed at [facility], [place] [address], [place], [place] [address]    Culture (A)  Final    ESCHERICHIA COLI Confirmed Extended Spectrum Beta-Lactamase Producer (ESBL).  In bloodstream infections from ESBL organisms, carbapenems are preferred over piperacillin/tazobactam. They are shown to have a lower risk of mortality.    Report Status 09/26/2021 FINAL  Final   Organism ID, Bacteria ESCHERICHIA COLI  Final      Susceptibility   Escherichia coli - MIC*    AMPICILLIN >=32 RESISTANT Resistant     CEFAZOLIN >=64 RESISTANT Resistant     CEFEPIME 16 RESISTANT Resistant     CEFTAZIDIME RESISTANT Resistant     CEFTRIAXONE >=64 RESISTANT Resistant     CIPROFLOXACIN >=4 RESISTANT Resistant     GENTAMICIN <=1 SENSITIVE Sensitive     IMIPENEM <=0.25 SENSITIVE Sensitive     TRIMETH/SULFA <=20 SENSITIVE Sensitive     AMPICILLIN/SULBACTAM 16 INTERMEDIATE Intermediate     PIP/TAZO <=4 SENSITIVE Sensitive     * ESCHERICHIA COLI  Blood Culture ID Panel (Reflexed)     Status: Abnormal   Collection Time: 09/23/21  7:30 PM  Result Value Ref Range Status   Enterococcus faecalis NOT DETECTED NOT DETECTED Final   Enterococcus Faecium NOT DETECTED NOT DETECTED Final   Listeria monocytogenes NOT DETECTED NOT DETECTED Final   Staphylococcus species NOT DETECTED NOT DETECTED Final   Staphylococcus aureus (BCID) NOT DETECTED NOT DETECTED Final   Staphylococcus epidermidis NOT DETECTED NOT DETECTED Final   Staphylococcus lugdunensis NOT  DETECTED NOT DETECTED Final   Streptococcus species NOT DETECTED NOT DETECTED Final   Streptococcus agalactiae NOT DETECTED NOT DETECTED Final   Streptococcus pneumoniae NOT DETECTED NOT DETECTED Final   Streptococcus pyogenes NOT DETECTED NOT DETECTED Final   A.calcoaceticus-baumannii NOT DETECTED NOT DETECTED Final   Bacteroides fragilis NOT DETECTED NOT DETECTED Final   Enterobacterales DETECTED (A) NOT DETECTED Final    Comment: Enterobacterales represent a large order of gram negative bacteria, not a single organism. CRITICAL RESULT CALLED TO, READ BACK BY AND VERIFIED WITH:  ELVIRA M. PHARMD [id number] 09/24/21 HNM    Enterobacter cloacae complex NOT DETECTED NOT DETECTED Final   Escherichia coli DETECTED (A) NOT DETECTED Final    Comment: CRITICAL RESULT CALLED TO, READ BACK BY AND VERIFIED WITH: ELVIRA M. PHARMD [id number] 09/24/21 HNM    Klebsiella aerogenes NOT DETECTED NOT DETECTED Final   Klebsiella oxytoca NOT DETECTED NOT DETECTED Final   Klebsiella pneumoniae NOT DETECTED NOT DETECTED Final   Proteus species NOT DETECTED NOT DETECTED Final   Salmonella species NOT DETECTED NOT DETECTED Final   Serratia marcescens NOT DETECTED NOT DETECTED Final   Haemophilus influenzae NOT DETECTED NOT DETECTED Final   Neisseria meningitidis NOT DETECTED NOT DETECTED Final   Pseudomonas aeruginosa NOT DETECTED NOT DETECTED Final   Stenotrophomonas maltophilia NOT DETECTED NOT DETECTED Final   Candida albicans NOT DETECTED NOT DETECTED Final   Candida auris NOT DETECTED NOT DETECTED Final   Candida glabrata NOT DETECTED NOT DETECTED Final   Candida krusei NOT DETECTED NOT DETECTED Final   Candida parapsilosis NOT DETECTED NOT DETECTED Final   Candida tropicalis NOT DETECTED NOT DETECTED Final   Cryptococcus neoformans/gattii NOT DETECTED NOT DETECTED Final   CTX-M ESBL DETECTED (A) NOT DETECTED Final    Comment: CRITICAL RESULT CALLED TO, READ BACK BY AND VERIFIED WITH: ELVIRA M. PHARMD 1275 09/24/21  HNM (NOTE) Extended spectrum beta-lactamase detected. Recommend a carbapenem as initial therapy.      Carbapenem resistance IMP NOT DETECTED NOT DETECTED Final   Carbapenem resistance KPC NOT DETECTED NOT DETECTED Final   Carbapenem resistance NDM NOT DETECTED NOT DETECTED Final   Carbapenem resist OXA 48 LIKE NOT DETECTED NOT DETECTED Final   Carbapenem resistance VIM NOT DETECTED NOT DETECTED Final    Comment: Performed at [facility], [address] [place], [place] [address]  Blood Culture (routine x 2)     Status: Abnormal   Collection Time: 09/23/21  8:34 PM   Specimen: BLOOD  Result Value Ref Range Status   Specimen Description   Final    BLOOD BLOOD RIGHT WRIST Performed at [facility], [address], [place], [place] [address]    Special Requests   Final    BOTTLES DRAWN AEROBIC AND ANAEROBIC Blood Culture results may not be optimal due to an inadequate volume of blood received in culture bottles Performed at [facility], [address], [place], [place] [address]    Culture  Setup Time   Final    GRAM NEGATIVE RODS IN BOTH AEROBIC AND ANAEROBIC BOTTLES CRITICAL VALUE NOTED.  VALUE IS CONSISTENT WITH PREVIOUSLY REPORTED AND CALLED VALUE. Performed at [facility], [address] [place], [place] [address]    Culture (A)  Final    ESCHERICHIA COLI SUSCEPTIBILITIES PERFORMED ON PREVIOUS CULTURE WITHIN THE LAST 5 DAYS. Performed at [facility], [place] [address], [place], [place] [address]    Report Status 09/26/2021 FINAL  Final  Culture, blood (Routine X 2) w Reflex to ID Panel     Status: None (Preliminary result)   Collection Time: 09/26/21  6:08 AM   Specimen: BLOOD  Result Value Ref Range Status   Specimen Description BLOOD RIGHT ANTECUBITAL  Final   Special Requests   Final    BOTTLES DRAWN AEROBIC AND ANAEROBIC Blood Culture adequate volume   Culture   Final    NO GROWTH 2 DAYS Performed at [facility], [address] [place], [place] [address]    Report  Status PENDING  Incomplete  Culture, blood (Routine X 2) w Reflex to ID Panel     Status: None (Preliminary result)   Collection Time: 09/26/21  6:29 AM   Specimen: BLOOD  Result Value Ref Range Status   Specimen Description BLOOD BLOOD RIGHT HAND  Final   Special Requests   Final    BOTTLES DRAWN AEROBIC AND ANAEROBIC Blood Culture adequate volume   Culture   Final    NO GROWTH 2 DAYS Performed at [facility], [address], [place], [place] [address]    Report Status PENDING  Incomplete     Labs: BNP (last 3 results) No results for input(s): "BNP" in the last 8760 hours. Basic Metabolic Panel: Recent Labs  Lab 09/24/21 0554 09/25/21 0404 09/26/21 0629 09/27/21 0532 09/28/21 0557  NA 138 137 133* 134* 133*  K 2.9* 4.1 3.4* 3.7 3.9  CL 101 107 102 100 101  CO2 _0 GLUCOSE 105* 100* 111* 104* 99  BUN _1 CREATININE 0.72 0.56 0.51 0.49 0.52  CALCIUM 7.9* 7.9* 8.3* 8.0* 8.0*  MG  --   --  2.0  --  2.1   Liver Function Tests: Recent Labs  Lab 09/23/21 1413 09/23/21 1915 09/24/21 0554 09/25/21 0404  AST 115* 124* 112* 53*  ALT 122* 140* 127* 86*  ALKPHOS 231* 277* 260* 200*  BILITOT 1.0 1.5* 2.1* 1.1  PROT 6.2* 6.2* 5.3* 5.3*  ALBUMIN 2.6* 2.8* 2.4* 2.1*   No results for input(s): "LIPASE", "AMYLASE" in the last 168 hours. No results for input(s): "AMMONIA" in the last 168 hours. CBC: Recent Labs  Lab 09/23/21 1413 09/23/21 1915 09/24/21 0554 09/25/21 0404 09/26/21 0629 09/27/21 0532 09/28/21 0557  WBC 0.7* <0.1* <0.1* 0.2* 0.2* 0.2* 0.4*  NEUTROABS 0.3* 0.0*  --   --   --   --   --   HGB 10.6* 10.9* 9.7* 9.3* 9.0* 8.9* 9.5*  HCT 33.3* 34.4* 31.1* 29.3* 28.5* 28.3* 30.4*  MCV 75.9* 75.1* 75.5* 74.0* 74.4* 74.5* 74.1*  PLT 119* 90* 50* 43* 43* 38* 62*   Cardiac Enzymes: No results for input(s): "CKTOTAL", "CKMB", "CKMBINDEX", "TROPONINI" in the last 168  hours. BNP: Invalid input(s): "POCBNP" CBG: No results for input(s): "GLUCAP" in the last 168 hours. D-Dimer No results for input(s): "DDIMER" in the last 72 hours. Hgb A1c No results for input(s): "HGBA1C" in the last 72 hours. Lipid Profile No results for input(s): "CHOL", "HDL", "LDLCALC", "TRIG", "CHOLHDL", "LDLDIRECT" in the last 72 hours. Thyroid function studies No results for input(s): "TSH", "T4TOTAL", "T3FREE", "THYROIDAB" in the last 72 hours.  Invalid input(s): "FREET3" Anemia work up No results for input(s): "VITAMINB12", "FOLATE", "FERRITIN", "TIBC", "IRON", "RETICCTPCT" in the last 72 hours. Urinalysis    Component Value Date/Time   COLORURINE YELLOW (A) 09/23/2021 1915   APPEARANCEUR CLOUDY (A) 09/23/2021 1915   LABSPEC 1.008 09/23/2021 1915   PHURINE 7.0 09/23/2021 1915   GLUCOSEU NEGATIVE 09/23/2021 1915   HGBUR LARGE (A) 09/23/2021 1915   BILIRUBINUR NEGATIVE 09/23/2021 [place] 09/23/2021 1915   PROTEINUR NEGATIVE 09/23/2021 1915   NITRITE NEGATIVE 09/23/2021 1915   LEUKOCYTESUR LARGE (A) 09/23/2021 1915   Sepsis Labs Recent Labs  Lab 09/25/21 0404 09/26/21 0629 09/27/21 0532 09/28/21 0557  WBC 0.2* 0.2* 0.2* 0.4*   Microbiology Recent Results (from the past 240 hour(s))  Urine culture     Status: Abnormal   Collection Time:  09/23/21  2:55 PM   Specimen: Urine, Random  Result Value Ref Range Status   Specimen Description   Final    URINE, RANDOM Performed at [facility], [address] [place], [place] [address]    Special Requests   Final    NONE Performed at [facility], [address] [place], [place] [address]    Culture (A)  Final    >=100,000 COLONIES/mL ESCHERICHIA COLI Confirmed Extended Spectrum Beta-Lactamase Producer (ESBL).  In bloodstream infections from ESBL organisms, carbapenems are preferred over piperacillin/tazobactam. They are shown to have a lower risk of mortality.    Report Status  09/26/2021 FINAL  Final   Organism ID, Bacteria ESCHERICHIA COLI (A)  Final      Susceptibility   Escherichia coli - MIC*    AMPICILLIN >=32 RESISTANT Resistant     CEFAZOLIN >=64 RESISTANT Resistant     CEFEPIME >=32 RESISTANT Resistant     CEFTRIAXONE >=64 RESISTANT Resistant     CIPROFLOXACIN >=4 RESISTANT Resistant     GENTAMICIN <=1 SENSITIVE Sensitive     IMIPENEM <=0.25 SENSITIVE Sensitive     NITROFURANTOIN 32 SENSITIVE Sensitive     TRIMETH/SULFA <=20 SENSITIVE Sensitive     AMPICILLIN/SULBACTAM >=32 RESISTANT Resistant     PIP/TAZO <=4 SENSITIVE Sensitive     * >=100,000 COLONIES/mL ESCHERICHIA COLI  Urine Culture     Status: Abnormal   Collection Time: 09/23/21  7:15 PM   Specimen: In/Out Cath Urine  Result Value Ref Range Status   Specimen Description   Final    IN/OUT CATH URINE Performed at [facility], [address] [place], [place] [address]    Special Requests   Final    NONE Performed at [facility], [address] [place], [place] [address]    Culture (A)  Final    >=100,000 COLONIES/mL ESCHERICHIA COLI Confirmed Extended Spectrum Beta-Lactamase Producer (ESBL).  In bloodstream infections from ESBL organisms, carbapenems are preferred over piperacillin/tazobactam. They are shown to have a lower risk of mortality.    Report Status 09/26/2021 FINAL  Final   Organism ID, Bacteria ESCHERICHIA COLI (A)  Final      Susceptibility   Escherichia coli - MIC*    AMPICILLIN >=32 RESISTANT Resistant     CEFAZOLIN >=64 RESISTANT Resistant     CEFEPIME >=32 RESISTANT Resistant     CEFTRIAXONE >=64 RESISTANT Resistant     CIPROFLOXACIN >=4 RESISTANT Resistant     GENTAMICIN <=1 SENSITIVE Sensitive     IMIPENEM <=0.25 SENSITIVE Sensitive     NITROFURANTOIN 32 SENSITIVE Sensitive     TRIMETH/SULFA <=20 SENSITIVE Sensitive     AMPICILLIN/SULBACTAM >=32 RESISTANT Resistant     PIP/TAZO <=4 SENSITIVE Sensitive     * >=100,000 COLONIES/mL  ESCHERICHIA COLI  Resp Panel by RT-PCR (Flu A&B, Covid) Anterior Nasal Swab     Status: Abnormal   Collection Time: 09/23/21  7:30 PM   Specimen: Anterior Nasal Swab  Result Value Ref Range Status   SARS Coronavirus 2 by RT PCR POSITIVE (A) NEGATIVE Final    Comment: RESULT CALLED TO, READ BACK BY AND VERIFIED WITH: ASHLEY ORSUTO AT 2057 ON 09/23/21 BY SS (NOTE) SARS-CoV-2 target nucleic acids are DETECTED.  The SARS-CoV-2 RNA is generally detectable in upper respiratory specimens during the acute phase of infection. Positive results are indicative of the presence of the identified virus, but do not rule out bacterial infection or co-infection with other pathogens not detected  by the test. Clinical correlation with patient history and other diagnostic information is necessary to determine patient infection status. The expected result is Negative.  Fact Sheet for Patients: EntrepreneurPulse.com.au  Fact Sheet for Healthcare Providers: IncredibleEmployment.be  This test is not yet approved or cleared by the [place] FDA and  has been authorized for detection and/or diagnosis of SARS-CoV-2 by FDA under an Emergency Use Authorization (EUA).  This EUA will remain in effect (meaning this test ca n be used) for the duration of  the COVID-19 declaration under Section 564(b)(1) of the Act, 21 U.S.C. section 360bbb-3(b)(1), unless the authorization is terminated or revoked sooner.     Influenza A by PCR NEGATIVE NEGATIVE Final   Influenza B by PCR NEGATIVE NEGATIVE Final    Comment: (NOTE) The Xpert Xpress SARS-CoV-2/FLU/RSV plus assay is intended as an aid in the diagnosis of influenza from Nasopharyngeal swab specimens and should not be used as a sole basis for treatment. Nasal washings and aspirates are unacceptable for Xpert Xpress SARS-CoV-2/FLU/RSV testing.  Fact Sheet for Patients: EntrepreneurPulse.com.au  Fact Sheet  for Healthcare Providers: IncredibleEmployment.be  This test is not yet approved or cleared by the [place] FDA and has been authorized for detection and/or diagnosis of SARS-CoV-2 by FDA under an Emergency Use Authorization (EUA). This EUA will remain in effect (meaning this test can be used) for the duration of the COVID-19 declaration under Section 564(b)(1) of the Act, 21 U.S.C. section 360bbb-3(b)(1), unless the authorization is terminated or revoked.  Performed at [facility], [address], [place], [place] [address]   Blood Culture (routine x 2)     Status: Abnormal   Collection Time: 09/23/21  7:30 PM   Specimen: BLOOD  Result Value Ref Range Status   Specimen Description   Final    BLOOD BLOOD RIGHT HAND Performed at [facility], [address], [place], [place] [address]    Special Requests   Final    BOTTLES DRAWN AEROBIC AND ANAEROBIC Blood Culture adequate volume Performed at [facility], [address] [place], [place] [address]    Culture  Setup Time   Final    GRAM NEGATIVE RODS IN BOTH AEROBIC AND ANAEROBIC BOTTLES Organism ID to follow CRITICAL RESULT CALLED TO, READ BACK BY AND VERIFIED WITH: Ricci Barker M. PHARMD [id number] 09/24/21 HNM GRAM STAIN REVIEWED-AGREE WITH RESULT Performed at [facility], [place] [address] [place], [place] [address]    Culture (A)  Final    ESCHERICHIA COLI Confirmed Extended Spectrum Beta-Lactamase Producer (ESBL).  In bloodstream infections from ESBL organisms, carbapenems are preferred over piperacillin/tazobactam. They are shown to have a lower risk of mortality.    Report Status 09/26/2021 FINAL  Final   Organism ID, Bacteria ESCHERICHIA COLI  Final      Susceptibility   Escherichia coli - MIC*    AMPICILLIN >=32 RESISTANT Resistant     CEFAZOLIN >=64 RESISTANT Resistant     CEFEPIME 16 RESISTANT Resistant     CEFTAZIDIME RESISTANT Resistant     CEFTRIAXONE >=64  RESISTANT Resistant     CIPROFLOXACIN >=4 RESISTANT Resistant     GENTAMICIN <=1 SENSITIVE Sensitive     IMIPENEM <=0.25 SENSITIVE Sensitive     TRIMETH/SULFA <=20 SENSITIVE Sensitive     AMPICILLIN/SULBACTAM 16 INTERMEDIATE Intermediate     PIP/TAZO <=4 SENSITIVE Sensitive     * ESCHERICHIA COLI  Blood Culture ID Panel (Reflexed)     Status: Abnormal   Collection  Time: 09/23/21  7:30 PM  Result Value Ref Range Status   Enterococcus faecalis NOT DETECTED NOT DETECTED Final   Enterococcus Faecium NOT DETECTED NOT DETECTED Final   Listeria monocytogenes NOT DETECTED NOT DETECTED Final   Staphylococcus species NOT DETECTED NOT DETECTED Final   Staphylococcus aureus (BCID) NOT DETECTED NOT DETECTED Final   Staphylococcus epidermidis NOT DETECTED NOT DETECTED Final   Staphylococcus lugdunensis NOT DETECTED NOT DETECTED Final   Streptococcus species NOT DETECTED NOT DETECTED Final   Streptococcus agalactiae NOT DETECTED NOT DETECTED Final   Streptococcus pneumoniae NOT DETECTED NOT DETECTED Final   Streptococcus pyogenes NOT DETECTED NOT DETECTED Final   A.calcoaceticus-baumannii NOT DETECTED NOT DETECTED Final   Bacteroides fragilis NOT DETECTED NOT DETECTED Final   Enterobacterales DETECTED (A) NOT DETECTED Final    Comment: Enterobacterales represent a large order of gram negative bacteria, not a single organism. CRITICAL RESULT CALLED TO, READ BACK BY AND VERIFIED WITH: ELVIRA M. PHARMD 2951 09/24/21 HNM    Enterobacter cloacae complex NOT DETECTED NOT DETECTED Final   Escherichia coli DETECTED (A) NOT DETECTED Final    Comment: CRITICAL RESULT CALLED TO, READ BACK BY AND VERIFIED WITH: ELVIRA M. PHARMD [id number] 09/24/21 HNM    Klebsiella aerogenes NOT DETECTED NOT DETECTED Final   Klebsiella oxytoca NOT DETECTED NOT DETECTED Final   Klebsiella pneumoniae NOT DETECTED NOT DETECTED Final   Proteus species NOT DETECTED NOT DETECTED Final   Salmonella species NOT DETECTED NOT DETECTED Final    Serratia marcescens NOT DETECTED NOT DETECTED Final   Haemophilus influenzae NOT DETECTED NOT DETECTED Final   Neisseria meningitidis NOT DETECTED NOT DETECTED Final   Pseudomonas aeruginosa NOT DETECTED NOT DETECTED Final   Stenotrophomonas maltophilia NOT DETECTED NOT DETECTED Final   Candida albicans NOT DETECTED NOT DETECTED Final   Candida auris NOT DETECTED NOT DETECTED Final   Candida glabrata NOT DETECTED NOT DETECTED Final   Candida krusei NOT DETECTED NOT DETECTED Final   Candida parapsilosis NOT DETECTED NOT DETECTED Final   Candida tropicalis NOT DETECTED NOT DETECTED Final   Cryptococcus neoformans/gattii NOT DETECTED NOT DETECTED Final   CTX-M ESBL DETECTED (A) NOT DETECTED Final    Comment: CRITICAL RESULT CALLED TO, READ BACK BY AND VERIFIED WITH: ELVIRA M. PHARMD [id number] 09/24/21 HNM (NOTE) Extended spectrum beta-lactamase detected. Recommend a carbapenem as initial therapy.      Carbapenem resistance IMP NOT DETECTED NOT DETECTED Final   Carbapenem resistance KPC NOT DETECTED NOT DETECTED Final   Carbapenem resistance NDM NOT DETECTED NOT DETECTED Final   Carbapenem resist OXA 48 LIKE NOT DETECTED NOT DETECTED Final   Carbapenem resistance VIM NOT DETECTED NOT DETECTED Final    Comment: Performed at [facility], [address], [place], [place] [address]  Blood Culture (routine x 2)     Status: Abnormal   Collection Time: 09/23/21  8:34 PM   Specimen: BLOOD  Result Value Ref Range Status   Specimen Description   Final    BLOOD BLOOD RIGHT WRIST Performed at [facility], [address], [place], [place] [address]    Special Requests   Final    BOTTLES DRAWN AEROBIC AND ANAEROBIC Blood Culture results may not be optimal due to an inadequate volume of blood received in culture bottles Performed at [facility], [address] [place], [place] [address]    Culture  Setup Time   Final    GRAM NEGATIVE RODS IN BOTH AEROBIC AND  ANAEROBIC BOTTLES  CRITICAL VALUE NOTED.  VALUE IS CONSISTENT WITH PREVIOUSLY REPORTED AND CALLED VALUE. Performed at [facility], [address] [place], [place] [address]    Culture (A)  Final    ESCHERICHIA COLI SUSCEPTIBILITIES PERFORMED ON PREVIOUS CULTURE WITHIN THE LAST 5 DAYS. Performed at [facility], [place] [address], [place], [place] [address]    Report Status 09/26/2021 FINAL  Final  Culture, blood (Routine X 2) w Reflex to ID Panel     Status: None (Preliminary result)   Collection Time: 09/26/21  6:08 AM   Specimen: BLOOD  Result Value Ref Range Status   Specimen Description BLOOD RIGHT ANTECUBITAL  Final   Special Requests   Final    BOTTLES DRAWN AEROBIC AND ANAEROBIC Blood Culture adequate volume   Culture   Final    NO GROWTH 2 DAYS Performed at [facility], [address], [place], [place] [address]    Report Status PENDING  Incomplete  Culture, blood (Routine X 2) w Reflex to ID Panel     Status: None (Preliminary result)   Collection Time: 09/26/21  6:29 AM   Specimen: BLOOD  Result Value Ref Range Status   Specimen Description BLOOD BLOOD RIGHT HAND  Final   Special Requests   Final    BOTTLES DRAWN AEROBIC AND ANAEROBIC Blood Culture adequate volume   Culture   Final    NO GROWTH 2 DAYS Performed at [facility], [address] [place], [place] [address]    Report Status PENDING  Incomplete   Imaging US Abdomen Limited RUQ (LIVER/GB)  Result Date: 09/23/2021 CLINICAL DATA:  Elevated LFTs EXAM: ULTRASOUND ABDOMEN LIMITED RIGHT UPPER QUADRANT COMPARISON:  01/06/2021 FINDINGS: Gallbladder: Surgically removed Common bile duct: Diameter: 5.4 mm. Liver: No focal lesion identified. Within normal limits in parenchymal echogenicity. 3 mm calcification is noted in the right lobe of the liver stable from prior CT. Portal vein is patent on color Doppler imaging with normal direction of blood flow towards the liver. Other: None.  IMPRESSION: Status post cholecystectomy. No acute abnormality to correspond with the given clinical history is noted. Electronically Signed   By: Inez Catalina M.D.   On: 09/23/2021 23:30   DG Chest Port 1 View  Result Date: 09/23/2021 CLINICAL DATA:  A 76 year old female presents with questionable sepsis. EXAM: PORTABLE CHEST 1 VIEW COMPARISON:  September 07, 2021. FINDINGS: EKG leads project over the chest. RIGHT-sided Port-A-Cath terminates at the caval to atrial junction. Cardiomediastinal contours and hilar structures are stable. Lungs are clear. No visible pneumothorax. On limited assessment there is no acute skeletal finding. IMPRESSION: 1. No active disease. 2. RIGHT-sided Port-A-Cath terminates at the caval to atrial junction. 3. Known adenopathy in the chest and lower neck better seen on prior PET imaging Electronically Signed   By: Zetta Bills M.D.   On: 09/23/2021 19:49    Time coordinating discharge: Over 30 minutes  SIGNED:  Emeterio Reeve DO [facility]

## 2021-09-28 NOTE — Progress Notes (Signed)
PHARMACY CONSULT NOTE FOR:  OUTPATIENT  PARENTERAL ANTIBIOTIC THERAPY (OPAT)  Indication: ESBL E Coli bacteremia Regimen: Ertapenem 1gm IV q24h End date: 10/03/2021  Labs - Once weekly:  CBC/D and CMP Fax weekly lab results  promptly to [phone]  IV antibiotic discharge orders are pended. To discharging provider:  please sign these orders via discharge navigator,  Select New Orders & click on the button choice - Manage This Unsigned Work.     Thank you for allowing pharmacy to be a part of this patient's care.  Doreene Eland, PharmD, BCPS, BCIDP Work Cell: [phone] 09/28/2021 12:39 PM

## 2021-09-28 NOTE — Progress Notes (Signed)
   Date of Admission:  09/23/2021      ID: Stacey Stone is a 76 y.o. female Principal Problem:   Sepsis ([place]) Active Problems:   Coronary artery disease   Essential hypertension   Hypothyroidism   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HCC)   OSA on CPAP   Paroxysmal atrial fibrillation (HCC)   DLBCL (diffuse large B cell lymphoma) (HCC)   Neutropenic fever (HCC)   Pancytopenia, chemotherapy induced(HCC)   Urinary tract infection   COVID-19 virus infection   Abnormal LFTs   History of breast cancer   Bacteremia   Finger nail contusion, initial encounter   Bacteremia due to ESBL-producing Escherichia coli source UTI  Family at bed side  Subjective: Doing much better No urgency or frequency No fever  Medications:   amLODipine  5 mg Oral Daily   aspirin EC  81 mg Oral Daily   atorvastatin  40 mg Oral QHS   Chlorhexidine Gluconate Cloth  6 each Topical Daily   famotidine  40 mg Oral QHS   isosorbide mononitrate  30 mg Oral Daily   letrozole  2.5 mg Oral Daily   levothyroxine  125 mcg Oral Q0600   sertraline  50 mg Oral QHS   silver nitrate applicators  5 Stick Topical Once   sotalol  80 mg Oral BID    Objective: Vital signs in last 24 hours: Temp:  [98.1 F (36.7 C)-99.4 F (37.4 C)] 98.1 F (36.7 C) (07/10 0646) Pulse Rate:  [69-86] 72 (07/10 0646) Resp:  [18] 18 (07/10 0646) BP: (151-189)/(75-95) 170/85 (07/10 0646) SpO2:  [96 %-100 %] 96 % (07/10 0646)   PHYSICAL EXAM:  General: Alert, cooperative, no distress, appears stated age.   PORT in place Neurologic: Grossly non-focal  Lab Results Recent Labs    09/27/21 0532 09/28/21 0557  WBC 0.2* 0.4*  HGB 8.9* 9.5*  HCT 28.3* 30.4*  NA 134* 133*  K 3.7 3.9  CL 100 101  CO2 27 26  BUN 12 11  CREATININE 0.49 0.52  Microbiology: 09/23/21 ESBL e.coli 09/26/21 - BC NG UC- ESBL e.coli   Assessment/Plan:  ESBL e.coli bacteremia and ESBL e.coli UTI Renal scan no hydronephrosis Post void scan no residue On IV  Meropenem since 09/24/21- will be changed to ertapenem for a total of 10 days- last dose would be 10/03/21  COVID illness- ct vaue 22 Got 5 days of Remdisivir Pt had received covid vaccine firt series and then a booster  Diffuse B cell lymphoma on RCHOP Risk for infection because of that  Febrile neutropenia due to chemo Fever resolved  Pancytopenia due to chemo  CAD  HTN - management as per primary team  Discussed the management with patient,her daughter and her siblings at bedside Follow up with oncologist

## 2021-09-28 NOTE — TOC Initial Note (Addendum)
Transition of Care [facility]) - Initial/Assessment Note    Patient Details  Name: Stacey Stone MRN: [id number] Date of Birth: 25-Jun-1945  Transition of Care [facility]) CM/SW Contact:    Alberteen Sam, LCSW Phone Number: 09/28/2021, 1:46 PM  Clinical Narrative:                  CSW has coordinated with Pam with home infusions who erports she will do teaching for home IV antibiotcs between 1:30-2:00 pm today, has patient set up with [facility] for RN services for IV home antibiotics.   Patient reports being agreeable with [facility] PT through [facility], has a walker at home.   Expected Discharge Plan: [place] Barriers to Discharge: Continued Medical Work up   Patient Goals and CMS Choice Patient states their goals for this hospitalization and ongoing recovery are:: to go home CMS Medicare.gov Compare Post Acute Care list provided to:: Patient Choice offered to / list presented to : Patient  Expected Discharge Plan and Services Expected Discharge Plan: [place]                                              Prior Living Arrangements/Services                       Activities of Daily Living Home Assistive Devices/Equipment: None ADL Screening (condition at time of admission) Patient's cognitive ability adequate to safely complete daily activities?: Yes Is the patient deaf or have difficulty hearing?: No Does the patient have difficulty seeing, even when wearing glasses/contacts?: No Does the patient have difficulty concentrating, remembering, or making decisions?: No Patient able to express need for assistance with ADLs?: Yes Does the patient have difficulty dressing or bathing?: No Independently performs ADLs?: Yes (appropriate for developmental age) Does the patient have difficulty walking or climbing stairs?: No Weakness of Legs: None Weakness of Arms/Hands: None  Permission Sought/Granted                  Emotional  Assessment              Admission diagnosis:  Bacteremia [R78.81] Neutropenic fever ([employer]) [D70.9, R50.81] Sepsis, due to unspecified organism, unspecified whether acute organ dysfunction present [facility]) [A41.9] Patient Active Problem List   Diagnosis Date Noted   Bacteremia due to ESBL-producing Escherichia coli source UTI 09/26/2021   Finger nail contusion, initial encounter 09/25/2021   Bacteremia 09/24/2021   Dehydration 09/23/2021   Neutropenic fever ([place]) 09/23/2021   Diabetes mellitus without complication ([place])    Pancytopenia, chemotherapy induced(HCC)    Urinary tract infection    COVID-19 virus infection    DLBCL (diffuse large B cell lymphoma) ([place]) 09/10/2021   Peripheral edema 07/13/2019   Sepsis ([place]) 05/24/2019   Cellulitis of right lower extremity 05/24/2019   Bilateral edema of lower extremity 05/24/2019   Abnormal LFTs 06/06/2018   History of breast cancer 06/06/2018   Status post ablation of atrial fibrillation 02/08/2018   PMB (postmenopausal bleeding) 06/17/2016   Preoperative cardiovascular examination 05/13/2016   Primary cancer of upper outer quadrant of left female breast ([place]) 05/09/2016   Unstable angina ([place]) 09/24/2015   S/P cardiac catheterization 09/24/2015   History of total right knee replacement 06/25/2015   B12 deficiency 01/15/2015   Vitamin D deficiency 01/15/2015   Paroxysmal  atrial fibrillation ([place]) 12/15/2014   Aortic valve disorder 11/06/2014   Hypersomnia with sleep apnea 11/06/2014   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

## 2021-09-28 NOTE — Treatment Plan (Signed)
Diagnosis: ESBL e,.coli bacteremia and UTI Baseline Creatinine <1    Allergies  Allergen Reactions   Codeine Other (See Comments)    Stroke like symptoms   Tramadol Itching    OPAT Orders Discharge antibiotics: Ertapenem 1 gram IV every 24 hours until 10/03/21  PORT care   Labs weekly while on IV antibiotics: _X_ CBC with differential  _X_ CMP    Fax weekly lab results  promptly to [phone]  Clinic Follow Up Appt:follow up with ID not needed Follow up with oncology   Call [phone] with any questions or concerns

## 2021-09-28 NOTE — Progress Notes (Signed)
Pt discharged home with family. Discharge packet reviewed with patient and family, verbalize understanding. Port dressing changed by IV team. Patient belongings returned.

## 2021-09-29 ENCOUNTER — Inpatient Hospital Stay (HOSPITAL_BASED_OUTPATIENT_CLINIC_OR_DEPARTMENT_OTHER): Payer: No Typology Code available for payment source | Admitting: Oncology

## 2021-09-29 ENCOUNTER — Inpatient Hospital Stay: Payer: No Typology Code available for payment source | Admitting: Oncology

## 2021-09-29 ENCOUNTER — Inpatient Hospital Stay: Payer: No Typology Code available for payment source

## 2021-09-29 DIAGNOSIS — C8338 Diffuse large B-cell lymphoma, lymph nodes of multiple sites: Secondary | ICD-10-CM

## 2021-09-29 NOTE — Progress Notes (Signed)
[place]  [phone]  [phone]     Stacey Stone DOB: 10/09/1945  MR#: [id number]  VXB#:[id number]  Patient Care Team: Rusty Aus, MD as PCP - General (Internal Medicine) Isaias Cowman, MD as Consulting Physician (Cardiology) Lloyd Huger, MD as Consulting Physician (Oncology)  I connected with Stacey Stone on 09/29/21 at 10:00 AM EDT by video enabled telemedicine visit and verified that I am speaking with the correct person using two identifiers.   I discussed the limitations, risks, security and privacy concerns of performing an evaluation and management service by telemedicine and the availability of in-person appointments. I also discussed with the patient that there may be a patient responsible charge related to this service. The patient expressed understanding and agreed to proceed.   Other persons participating in the visit and their role in the encounter: Patient, MD.  Patient's location: Home. Provider's location: Clinic.  CHIEF COMPLAINT: Stage III diffuse large B-cell lymphoma.  INTERVAL HISTORY: Patient agreed to video assisted telemedicine visit for postdischarge evaluation and to assess her toleration of cycle 1 of R-CHOP.  She was recently discharged from the hospital after a 5-day admission where she was noted to have sepsis as well as COVID.  She has persistent weakness and fatigue, but otherwise feels well.  She does not complain of any further fevers.  She denies any night sweats or weight loss.  She has no neurologic complaints.  She denies any chest pain, shortness of breath, or hemoptysis.  She denies any nausea, vomiting, constipation, or diarrhea. She has no urinary complaints.  Patient offers no further specific complaints today.  REVIEW OF SYSTEMS:   Review of Systems  Constitutional:  Positive for malaise/fatigue. Negative for chills, diaphoresis, fever and weight loss.  Respiratory:   Positive for cough. Negative for shortness of breath.   Cardiovascular: Negative.  Negative for chest pain and leg swelling.  Gastrointestinal: Negative.  Negative for abdominal pain.  Genitourinary: Negative.  Negative for dysuria.  Musculoskeletal: Negative.  Negative for back pain.  Skin: Negative.  Negative for rash.  Neurological:  Positive for weakness. Negative for sensory change, focal weakness and headaches.  Psychiatric/Behavioral: Negative.  The patient is not nervous/anxious.     As per HPI. Otherwise, a complete review of systems is negative.  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 MammaPrint was reported as low risk, therefore she did not require adjuvant chemotherapy. She completed adjuvant XRT. Will complete 5 year of letrozole in July 2023.   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  Patient's most recent CT scan on April 28, 2018 revealed no evidence of progressive or recurrent disease.    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[place])  07/05/2014 Initial Diagnosis   MALT (mucosa associated lymphoid tissue)   Primary cancer of upper outer quadrant of left female breast ([place])  05/09/2016 Initial Diagnosis   Primary cancer of upper outer quadrant of left female breast ([place])   DLBCL (diffuse large B cell lymphoma) ([place])  09/10/2021 Initial Diagnosis   DLBCL (diffuse large B cell lymphoma) ([place])   09/17/2021 -  Chemotherapy   Patient is on Treatment Plan : NON-HODGKINS LYMPHOMA R-CHOP q21d     09/29/2021 Cancer Staging   Staging form: Hodgkin and Non-Hodgkin  Lymphoma, AJCC 8th Edition - Clinical stage from 09/29/2021: Stage III (Diffuse large B-cell lymphoma) - Signed by Lloyd Huger, MD on 09/29/2021 Stage prefix:  Initial diagnosis     PAST MEDICAL HISTORY: Past Medical History:  Diagnosis Date   Abdominal pain, left lower quadrant    epiploic appendagitis   Anginal pain ([place])    Arthritis 06/06/2018   knee   Asthma    mild   Atrial fibrillation ([place])    Breast cancer ([place])    Coronary artery disease 06/06/2018   1 artery 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 Mamie Nick. at [facility] [place]   Depression    Diabetes mellitus without complication ([place])    Dyspnea    Dysrhythmia    GERD (gastroesophageal reflux disease)    Hypertension    Hypothyroidism    MALT (mucosa associated lymphoid tissue) ([place]) 07/05/2014   Right neck mass resected 01/2014.   No pertinent past medical history    Pancytopenia ([place])    Personal history of radiation therapy    Sleep apnea 06/06/2018   O2- 2l at bedtime and BiPap @ bedtime    PAST SURGICAL HISTORY: Past Surgical History:  Procedure Laterality Date   BREAST BIOPSY Left 2/136/2018   INVASIVE MAMMARY CARCINOMA   BREAST BIOPSY Right 05/25/2016   FIBROCYSTIC CHANGE WITH CALCIFICATIONS    BREAST BIOPSY Right 05/29/2020   Affirm bx-"X" clip-FIBROADENOMA WITH ASSOCIATED CALCIFICATIONS. - NEGATIVE   BREAST EXCISIONAL BIOPSY Left 06/04/2016   INVASIVE MAMMARY CARCINOMA.    BREAST LUMPECTOMY Left 06/04/2016   INVASIVE MAMMARY CARCINOMA.    CARDIAC CATHETERIZATION     CARDIAC CATHETERIZATION N/A 09/24/2015   Procedure: Left Heart Cath and Coronary Angiography;  Surgeon: Isaias Cowman, MD;  Location: [facility];  Service: Cardiovascular;  Laterality: N/A;   CARDIAC CATHETERIZATION N/A 09/24/2015   Procedure: Coronary Stent Intervention;  Surgeon: Isaias Cowman, MD;  Location: [facility];  Service: Cardiovascular;  Laterality: N/A;   CHOLECYSTECTOMY  11/26   08/1970   COLONOSCOPY WITH PROPOFOL N/A 07/08/2021   Procedure: COLONOSCOPY WITH PROPOFOL;  Surgeon: [place], Benay Pike, MD;  Location: [facility];  Service:  Gastroenterology;  Laterality: N/A;   DILATATION & CURETTAGE/HYSTEROSCOPY WITH MYOSURE N/A 05/05/2015   Procedure: Hysteroscopy and endometrial curretage;  Surgeon: Boykin Nearing, MD;  Location: [facility] ORS;  Service: Gynecology;  Laterality: N/A;   DILATION AND CURETTAGE OF UTERUS     EXCISION MASS FROM NECK  Right    Dr. Tami Ribas   EYE SURGERY  05/29/2020   bil cataract 9/08   HAMMER TOE SURGERY Right    IR BONE MARROW BIOPSY & ASPIRATION  09/09/2021   IR IMAGING GUIDED PORT INSERTION  09/09/2021   JOINT REPLACEMENT Bilateral 2008, 11/26/ 2012   Partial Knee Replacements   LEFT HEART CATH AND CORONARY ANGIOGRAPHY N/A 10/25/2017   Procedure: LEFT HEART CATH AND CORONARY ANGIOGRAPHY;  Surgeon: Teodoro Spray, MD;  Location: [facility];  Service: Cardiovascular;  Laterality: N/A;   LEFT HEART CATH AND CORONARY ANGIOGRAPHY N/A 10/26/2017   Procedure: LEFT HEART CATH AND CORONARY ANGIOGRAPHY;  Surgeon: Isaias Cowman, MD;  Location: [facility];  Service: Cardiovascular;  Laterality: N/A;   PARTIAL MASTECTOMY WITH NEEDLE LOCALIZATION Left 06/04/2016   Procedure: PARTIAL MASTECTOMY WITH NEEDLE LOCALIZATION;  Surgeon: Leonie Green, MD;  Location: [facility];  Service: General;  Laterality: Left;   SCLERAL BUCKLE  02/16/2011   Procedure: SCLERAL BUCKLE;  Surgeon: Jenny Reichmann  Eber Jones, MD;  Location: [facility];  Service: Ophthalmology;  Laterality: Left;  Scleral Buckle Left Eye with Headscope Laser   SENTINEL NODE BIOPSY Left 06/04/2016   Procedure: SENTINEL NODE BIOPSY;  Surgeon: Leonie Green, MD;  Location: [facility];  Service: General;  Laterality: Left;    FAMILY HISTORY Family History  Problem Relation Age of Onset   Breast cancer Mother 57   Heart disease Father    Breast cancer Paternal Aunt 51   Breast cancer Cousin    Anesthesia problems Neg Hx    Hypotension Neg Hx    Malignant hyperthermia Neg Hx    Pseudochol deficiency Neg Hx     GYNECOLOGIC  HISTORY:  No LMP recorded. Patient is postmenopausal.     ADVANCED DIRECTIVES:    HEALTH MAINTENANCE: Social History   Tobacco Use   Smoking status: Never   Smokeless tobacco: Never  Vaping Use   Vaping Use: Never used  Substance Use Topics   Alcohol use: No   Drug use: No     Colonoscopy:  PAP:  Bone density:  Mammogram: November 2016  Allergies  Allergen Reactions   Codeine Other (See Comments)    Stroke like symptoms   Tramadol Itching    Current Outpatient Medications  Medication Sig Dispense Refill   acetaminophen (TYLENOL) 500 MG tablet Take 500 mg by mouth every 4 (four) hours as needed.     allopurinol (ZYLOPRIM) 300 MG tablet Take 1 tablet (300 mg total) by mouth daily. 30 tablet 3   amLODipine (NORVASC) 10 MG tablet Take 1 tablet (10 mg total) by mouth daily. 30 tablet 0   apixaban (ELIQUIS) 5 MG TABS tablet Take 5 mg by mouth 2 (two) times daily.     atorvastatin (LIPITOR) 40 MG tablet Take 1 tablet (40 mg total) by mouth at bedtime.     diltiazem (CARDIZEM) 30 MG tablet Take 30 mg by mouth 2 (two) times daily as needed (for breakthrough afib).  (Patient not taking: Reported on 07/08/2021)     ertapenem (INVANZ) IVPB Inject 1 g into the vein daily for 4 days. Indication:  ESBL E coli bacteremia due to UTI First Dose: Yes Last Day of Therapy:  10/03/2021 Labs - Once weekly:  CBC/D and CMP Fax weekly lab results  promptly to [phone] Method of administration: Mini-Bag Plus / Gravity Method of administration may be changed at the discretion of home infusion pharmacist based upon assessment of the patient and/or caregiver's ability to self-administer the medication ordered. 5 Units 0   famotidine (PEPCID) 40 MG tablet Take 40 mg by mouth at bedtime.     ferrous sulfate 325 (65 FE) MG tablet Take 325 mg by mouth once a week. (Patient not taking: Reported on 09/23/2021)     Fluticasone-Salmeterol (ADVAIR) 100-50 MCG/DOSE AEPB Inhale 1 puff into the lungs 2 (two)  times daily as needed (for asthma.).     isosorbide mononitrate (IMDUR) 30 MG 24 hr tablet Take 30 mg by mouth daily.     letrozole (FEMARA) 2.5 MG tablet TAKE 1 TABLET BY MOUTH ONCE DAILY 90 tablet 3   levothyroxine (SYNTHROID) 125 MCG tablet Take 125 mcg by mouth daily.     lidocaine-prilocaine (EMLA) cream Apply to affected area once 30 g 3   magic mouthwash (nystatin, hydrocortisone, diphenhydrAMINE) suspension Swish and spit 5 mLs 3 (three) times daily as needed for mouth pain. 540 mL 0   nitroGLYCERIN (NITROSTAT) 0.4 MG SL tablet Place 0.4  mg under the tongue every 5 (five) minutes as needed for chest pain. Maximum 3 doses, If no relief call md or 911. (Patient not taking: Reported on 09/23/2021)     ondansetron (ZOFRAN) 8 MG tablet Take 1 tablet (8 mg total) by mouth 2 (two) times daily as needed for refractory nausea / vomiting. Start on day 3 after cyclophosphamide chemotherapy. 60 tablet 1   potassium chloride (KLOR-CON) 10 MEQ tablet Take 1 tablet (10 mEq total) by mouth 2 (two) times daily.     predniSONE (DELTASONE) 20 MG tablet Take 5 tablets (100 mg total) by mouth daily. Take with food on days 1-5 of chemotherapy. (Patient not taking: Reported on 09/23/2021) 25 tablet 5   prochlorperazine (COMPAZINE) 10 MG tablet Take 1 tablet (10 mg total) by mouth every 6 (six) hours as needed (Nausea or vomiting). 30 tablet 6   RABEprazole (ACIPHEX) 20 MG tablet Take 20 mg by mouth daily.     sertraline (ZOLOFT) 50 MG tablet Take 50 mg by mouth at bedtime.     sotalol (BETAPACE) 80 MG tablet Take 80 mg by mouth 2 (two) times daily.     torsemide (DEMADEX) 20 MG tablet Take 10 mg by mouth as directed. Take on Tuesday and Friday (Patient not taking: Reported on 09/23/2021)     No current facility-administered medications for this visit.    OBJECTIVE: There were no vitals taken for this visit.   There is no height or weight on file to calculate BMI.    ECOG FS:1 - Symptomatic but completely  ambulatory  General: Well-developed, well-nourished, no acute distress. HEENT: Normocephalic. Neuro: Alert, answering all questions appropriately. Cranial nerves grossly intact. Psych: Normal affect.  LAB RESULTS:   STUDIES: US RENAL  Result Date: 09/25/2021 CLINICAL DATA:  Hydronephrosis EXAM: RENAL / URINARY TRACT ULTRASOUND COMPLETE COMPARISON:  08/19/2021 FINDINGS: Right Kidney: Renal measurements: 10.5 x 5.7 x 6.2 cm = volume: 196 mL. Echogenicity within normal limits. No mass or hydronephrosis visualized. Left Kidney: Renal measurements: 10.8 x 5.8 x 5.8 cm = volume: 190.7 mL. Echogenicity within normal limits. No hydronephrosis. Exophytic 1.9 cm cyst off the interpolar region. Bladder: Appears normal for degree of bladder distention. Other: None. IMPRESSION: 1. No evidence of hydronephrosis or nephrolithiasis. 2. Benign left renal cyst. Otherwise unremarkable appearance of the kidneys. Electronically Signed   By: Randa Ngo M.D.   On: 09/25/2021 21:56    ASSESSMENT: Stage III diffuse large B-cell lymphoma.  PLAN:    Stage III diffuse large B-cell lymphoma: PET scan and biopsy results reviewed independently.  Case discussed with pathology confirming this is not a recurrence of patient's MALT lymphoma.  Bone marrow biopsy did not reveal any evidence of lymphoma.  MUGA scan completed on September 15, 2021 revealed an EF of 75%.  Repeat in September 2023 if necessary.  Patient will benefit from 6 cycles of R-CHOP every 21 days with Udenyca support.  Will reimage with PET scan after cycle 4.  Despite hospital admission, patient tolerated cycle 1 of treatment relatively well.  Return to clinic as previously scheduled on October 08, 2021 for consideration of cycle 2.  If patient has not fully recovered from her admission or laboratory work remains decreased, this may be delayed 1 week.   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 MammaPrint was reported as low  risk, therefore she did not require adjuvant chemotherapy. She completed adjuvant XRT.  Patient completed 5 years of letrozole in June 2023.  Her most recent mammogram on May 20, 2021 was reported as BI-RADS 1.  Repeat in March 2024.   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  Case discussed with pathology.  This is a different primary.   Left renal lesion: CT scan results from December 15, 2020 with only mild increase of benign renal lesion to 13 mm.  No intervention is needed at this time. Bone mineral density: Patient's most recent bone mineral density on November 13, 2020 revealed a T score of -2.0 which is slightly worse than 1 year prior where her T score was reported -1.7.  Continue calcium and vitamin D supplementation.  Consider repeat in August 2023.   Anemia: Hemoglobin has declined to 9.5, monitor. Neutropenia: Secondary to chemotherapy.  Udenyca as above. Thrombocytopenia: Secondary to chemotherapy.  Improving. COVID: Continue remdesivir as prescribed. UTI/sepsis: Patient has IV antibiotics scheduled through Saturday of this week.  I provided 30 minutes of face-to-face video visit time during this encounter which included chart review, counseling, and coordination of care as documented above.   Patient expressed understanding and was in agreement with this plan. She also understands that She can call clinic at any time with any questions, concerns, or complaints.     Lloyd Huger, MD   09/29/2021 2:55 PM

## 2021-10-01 LAB — CULTURE, BLOOD (ROUTINE X 2)
Culture: NO GROWTH
Culture: NO GROWTH
Special Requests: ADEQUATE
Special Requests: ADEQUATE

## 2021-10-05 ENCOUNTER — Encounter: Payer: Self-pay | Admitting: Oncology

## 2021-10-07 ENCOUNTER — Encounter (INDEPENDENT_AMBULATORY_CARE_PROVIDER_SITE_OTHER): Payer: 59 | Admitting: Ophthalmology

## 2021-10-07 MED FILL — Dexamethasone Sodium Phosphate Inj 100 MG/10ML: INTRAMUSCULAR | Qty: 1 | Status: AC

## 2021-10-08 ENCOUNTER — Inpatient Hospital Stay: Payer: No Typology Code available for payment source

## 2021-10-08 ENCOUNTER — Encounter: Payer: Self-pay | Admitting: Oncology

## 2021-10-08 ENCOUNTER — Inpatient Hospital Stay (HOSPITAL_BASED_OUTPATIENT_CLINIC_OR_DEPARTMENT_OTHER): Payer: No Typology Code available for payment source | Admitting: Oncology

## 2021-10-08 VITALS — BP 94/62 | HR 72

## 2021-10-08 VITALS — BP 96/68 | HR 98 | Temp 97.4°F | Resp 16 | Ht 62.0 in | Wt 166.4 lb

## 2021-10-08 DIAGNOSIS — C8338 Diffuse large B-cell lymphoma, lymph nodes of multiple sites: Secondary | ICD-10-CM

## 2021-10-08 DIAGNOSIS — Z5111 Encounter for antineoplastic chemotherapy: Secondary | ICD-10-CM | POA: Diagnosis not present

## 2021-10-08 LAB — CBC WITH DIFFERENTIAL/PLATELET
Abs Immature Granulocytes: 1.68 10*3/uL — ABNORMAL HIGH (ref 0.00–0.07)
Basophils Absolute: 0.3 10*3/uL — ABNORMAL HIGH (ref 0.0–0.1)
Basophils Relative: 2 %
Eosinophils Absolute: 0 10*3/uL (ref 0.0–0.5)
Eosinophils Relative: 0 %
HCT: 34 % — ABNORMAL LOW (ref 36.0–46.0)
Hemoglobin: 10.7 g/dL — ABNORMAL LOW (ref 12.0–15.0)
Immature Granulocytes: 12 %
Lymphocytes Relative: 10 %
Lymphs Abs: 1.4 10*3/uL (ref 0.7–4.0)
MCH: 24.8 pg — ABNORMAL LOW (ref 26.0–34.0)
MCHC: 31.5 g/dL (ref 30.0–36.0)
MCV: 78.9 fL — ABNORMAL LOW (ref 80.0–100.0)
Monocytes Absolute: 1.2 10*3/uL — ABNORMAL HIGH (ref 0.1–1.0)
Monocytes Relative: 8 %
Neutro Abs: 9.7 10*3/uL — ABNORMAL HIGH (ref 1.7–7.7)
Neutrophils Relative %: 68 %
Platelets: 387 10*3/uL (ref 150–400)
RBC: 4.31 MIL/uL (ref 3.87–5.11)
RDW: 20 % — ABNORMAL HIGH (ref 11.5–15.5)
WBC: 14.3 10*3/uL — ABNORMAL HIGH (ref 4.0–10.5)
nRBC: 1 % — ABNORMAL HIGH (ref 0.0–0.2)

## 2021-10-08 LAB — COMPREHENSIVE METABOLIC PANEL
ALT: 21 U/L (ref 0–44)
AST: 34 U/L (ref 15–41)
Albumin: 3 g/dL — ABNORMAL LOW (ref 3.5–5.0)
Alkaline Phosphatase: 130 U/L — ABNORMAL HIGH (ref 38–126)
Anion gap: 8 (ref 5–15)
BUN: 9 mg/dL (ref 8–23)
CO2: 25 mmol/L (ref 22–32)
Calcium: 9 mg/dL (ref 8.9–10.3)
Chloride: 104 mmol/L (ref 98–111)
Creatinine, Ser: 0.68 mg/dL (ref 0.44–1.00)
GFR, Estimated: >60 mL/min (ref >60)
Glucose, Bld: 134 mg/dL — ABNORMAL HIGH (ref 70–99)
Potassium: 3.8 mmol/L (ref 3.5–5.1)
Sodium: 137 mmol/L (ref 135–145)
Total Bilirubin: 0.5 mg/dL (ref 0.3–1.2)
Total Protein: 6.5 g/dL (ref 6.5–8.1)

## 2021-10-08 MED ORDER — SODIUM CHLORIDE 0.9 % IV SOLN
375.0000 mg/m2 | Freq: Once | INTRAVENOUS | Status: AC
  Administered 2021-10-08: 700 mg via INTRAVENOUS
  Filled 2021-10-08: qty 70

## 2021-10-08 MED ORDER — SODIUM CHLORIDE 0.9 % IV SOLN
750.0000 mg/m2 | Freq: Once | INTRAVENOUS | Status: AC
  Administered 2021-10-08: 1420 mg via INTRAVENOUS
  Filled 2021-10-08: qty 50

## 2021-10-08 MED ORDER — HEPARIN SOD (PORK) LOCK FLUSH 100 UNIT/ML IV SOLN
INTRAVENOUS | Status: AC
  Administered 2021-10-08: 500 [IU]
  Filled 2021-10-08: qty 5

## 2021-10-08 MED ORDER — SODIUM CHLORIDE 0.9 % IV SOLN
Freq: Once | INTRAVENOUS | Status: AC
  Filled 2021-10-08: qty 250

## 2021-10-08 MED ORDER — PALONOSETRON HCL INJECTION 0.25 MG/5ML
0.2500 mg | Freq: Once | INTRAVENOUS | Status: AC
  Administered 2021-10-08: 0.25 mg via INTRAVENOUS
  Filled 2021-10-08: qty 5

## 2021-10-08 MED ORDER — DIPHENHYDRAMINE HCL 25 MG PO CAPS
25.0000 mg | ORAL_CAPSULE | Freq: Once | ORAL | Status: AC
  Administered 2021-10-08: 25 mg via ORAL
  Filled 2021-10-08: qty 1

## 2021-10-08 MED ORDER — SODIUM CHLORIDE 0.9 % IV SOLN: 375.0000 mg/m2 | Freq: Once | INTRAVENOUS | Status: DC

## 2021-10-08 MED ORDER — SODIUM CHLORIDE 0.9 % IV SOLN
10.0000 mg | Freq: Once | INTRAVENOUS | Status: AC
  Administered 2021-10-08: 10 mg via INTRAVENOUS
  Filled 2021-10-08: qty 10

## 2021-10-08 MED ORDER — HEPARIN SOD (PORK) LOCK FLUSH 100 UNIT/ML IV SOLN
500.0000 [IU] | Freq: Once | INTRAVENOUS | Status: AC | PRN
  Filled 2021-10-08: qty 5

## 2021-10-08 MED ORDER — VINCRISTINE SULFATE CHEMO INJECTION 1 MG/ML
2.0000 mg | Freq: Once | INTRAVENOUS | Status: AC
  Administered 2021-10-08: 2 mg via INTRAVENOUS
  Filled 2021-10-08: qty 2

## 2021-10-08 MED ORDER — DOXORUBICIN HCL CHEMO IV INJECTION 2 MG/ML
50.0000 mg/m2 | Freq: Once | INTRAVENOUS | Status: AC
  Administered 2021-10-08: 94 mg via INTRAVENOUS
  Filled 2021-10-08: qty 47

## 2021-10-08 MED ORDER — ACETAMINOPHEN 325 MG PO TABS
650.0000 mg | ORAL_TABLET | Freq: Once | ORAL | Status: AC
  Administered 2021-10-08: 650 mg via ORAL
  Filled 2021-10-08: qty 2

## 2021-10-08 MED ORDER — SODIUM CHLORIDE 0.9 % IV SOLN
150.0000 mg | Freq: Once | INTRAVENOUS | Status: AC
  Administered 2021-10-08: 150 mg via INTRAVENOUS
  Filled 2021-10-08: qty 5

## 2021-10-08 NOTE — Progress Notes (Signed)
Rapid Infusion Rituximab Pharmacist Evaluation  Stacey Stone is a 76 y.o. female being treated with rituximab for nonhodkin lymphoma. This patient may be considered for RIR.   A pharmacist has verified the patient tolerated rituximab infusions per the [facility] standard infusion protocol without grade 3-4 infusion reactions. The treatment plan will be updated to reflect RIR if the patient qualifies per the checklist below:   Age > 28 years old Yes   Clinically significant cardiovascular disease Yes   Circulating lymphocyte count < 5000/uL prior to cycle two Yes  Lab Results  Component Value Date   LYMPHSABS 1.4 10/08/2021    Prior documented grade 3-4 infusion reaction to rituximab No   Prior documented grade 1-2 infusion reaction to rituximab (If YES, Pharmacist will confirm with Physician if patient is still a candidate for RIR) No   Previous rituximab infusion within the past 6 months Yes   Treatment Plan updated orders to reflect RIR Yes    [name] does meet the criteria for Rapid Infusion Rituximab. This patient is going to be switched to rapid infusion rituximab.   [name] 10/08/21 9:24 AM

## 2021-10-08 NOTE — Patient Instructions (Signed)
[facility] CANCER CTR AT [place]  Discharge Instructions: Thank you for choosing [facility] to provide your oncology and hematology care.  If you have a lab appointment with the [facility], please go directly to the [facility] and check in at the registration area.  Wear comfortable clothing and clothing appropriate for easy access to any Portacath or PICC line.   We strive to give you quality time with your provider. You may need to reschedule your appointment if you arrive late (15 or more minutes).  Arriving late affects you and other patients whose appointments are after yours.  Also, if you miss three or more appointments without notifying the office, you may be dismissed from the clinic at the provider's discretion.      For prescription refill requests, have your pharmacy contact our office and allow 72 hours for refills to be completed.    Today you received the following chemotherapy and/or immunotherapy agents Adriamycin, Oncovin, Cytoxan & Truxima      To help prevent nausea and vomiting after your treatment, we encourage you to take your nausea medication as directed.  BELOW ARE SYMPTOMS THAT SHOULD BE REPORTED IMMEDIATELY: *FEVER GREATER THAN 100.4 F (38 C) OR HIGHER *CHILLS OR SWEATING *NAUSEA AND VOMITING THAT IS NOT CONTROLLED WITH YOUR NAUSEA MEDICATION *UNUSUAL SHORTNESS OF BREATH *UNUSUAL BRUISING OR BLEEDING *URINARY PROBLEMS (pain or burning when urinating, or frequent urination) *BOWEL PROBLEMS (unusual diarrhea, constipation, pain near the anus) TENDERNESS IN MOUTH AND THROAT WITH OR WITHOUT PRESENCE OF ULCERS (sore throat, sores in mouth, or a toothache) UNUSUAL RASH, SWELLING OR PAIN  UNUSUAL VAGINAL DISCHARGE OR ITCHING   Items with * indicate a potential emergency and should be followed up as soon as possible or go to the Emergency Department if any problems should occur.  Please show the CHEMOTHERAPY ALERT CARD or  IMMUNOTHERAPY ALERT CARD at check-in to the Emergency Department and triage nurse.  Should you have questions after your visit or need to cancel or reschedule your appointment, please contact [facility] CANCER [place] AT [place]  [phone] and follow the prompts.  Office hours are 8:00 a.m. to 4:30 p.m. Monday - Friday. Please note that voicemails left after 4:00 p.m. may not be returned until the following business day.  We are closed weekends and major holidays. You have access to a nurse at all times for urgent questions. Please call the main number to the clinic [phone] and follow the prompts.  For any non-urgent questions, you may also contact your provider using MyChart. We now offer e-Visits for anyone 11 and older to request care online for non-urgent symptoms. For details visit [email or web address].   Also download the MyChart app! Go to the app store, search "MyChart", open the app, select [facility], and log in with your MyChart username and password.  Masks are optional in the cancer centers. If you would like for your care team to wear a mask while they are taking care of you, please let them know. For doctor visits, patients may have with them one support person who is at least 78 years old. At this time, visitors are not allowed in the infusion area.

## 2021-10-08 NOTE — Progress Notes (Signed)
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  INTERVAL HISTORY: Here for cycle 2 of R-CHOP.  Reports feeling well for the first 4 days post cycle 1.  Developed a UTI and incidentally noted to have COVID and was hospitalized for several days. Received IV antibiotics and remdesivir for COVID-19.  Feeling much better today.  Still having stable baseline fatigue.  Eating and drinking well.  Daughter states, she was doing really well following chemo but had no warning signs prior to her hospitalization for her UTI.  Has concerns of this happening again post this next cycle if she is not evaluated sooner.  Patient states she feels back to baseline with no new or concerning symptoms.  REVIEW OF SYSTEMS:   Review of Systems  Constitutional:  Positive for malaise/fatigue (Baseline fatigue). Negative for chills, fever and weight loss.  HENT:  Negative for congestion, ear pain and tinnitus.   Eyes: Negative.  Negative for blurred vision and double vision.  Respiratory: Negative.  Negative for cough, sputum production and shortness of breath.   Cardiovascular: Negative.  Negative for chest pain, palpitations and leg swelling.  Gastrointestinal: Negative.  Negative for abdominal pain, constipation, diarrhea, nausea and vomiting.  Genitourinary:  Negative for dysuria, frequency and urgency.  Musculoskeletal:  Negative for back pain and falls.  Skin: Negative.  Negative for rash.  Neurological:  Positive for dizziness and weakness. Negative for headaches.  Endo/Heme/Allergies: Negative.  Does not bruise/bleed easily.  Psychiatric/Behavioral: Negative.   Negative for depression. The patient is not nervous/anxious and does not have insomnia.     As per HPI. Otherwise, a complete review of systems is negative.  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 MammaPrint was reported as low risk, therefore she did not require adjuvant chemotherapy. She completed adjuvant XRT. Will complete 5 year of letrozole in July 2023.   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  Patient's most recent CT scan on April 28, 2018 revealed no evidence of progressive or recurrent disease.    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[place])  07/05/2014 Initial Diagnosis   MALT (mucosa associated lymphoid tissue)   Primary cancer of upper outer quadrant of left female breast ([place])  05/09/2016 Initial Diagnosis   Primary cancer of upper outer quadrant of left female breast ([place])   DLBCL (diffuse large B cell lymphoma) ([place])  09/10/2021 Initial Diagnosis   DLBCL (diffuse large B cell lymphoma) ([place])   09/17/2021 -  Chemotherapy   Patient is on Treatment Plan : NON-HODGKINS LYMPHOMA R-CHOP q21d     09/29/2021 Cancer Staging   Staging form: Hodgkin and Non-Hodgkin Lymphoma, AJCC 8th Edition - Clinical stage from 09/29/2021: Stage III (Diffuse large B-cell lymphoma) - Signed by Lloyd Huger, MD on 09/29/2021 Stage prefix: Initial diagnosis     PAST MEDICAL HISTORY: Past Medical History:  Diagnosis Date   Abdominal pain, left lower quadrant    epiploic appendagitis   Anginal pain ([place])  Arthritis 06/06/2018   knee   Asthma    mild   Atrial fibrillation (HCC)    Breast cancer (HCC)    Coronary artery disease 06/06/2018   1 artery 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 Mamie Nick. at [facility] in [place]   Depression     Diabetes mellitus without complication ([place])    Dyspnea    Dysrhythmia    GERD (gastroesophageal reflux disease)    Hypertension    Hypothyroidism    MALT (mucosa associated lymphoid tissue) ([place]) 07/05/2014   Right neck mass resected 01/2014.   No pertinent past medical history    Pancytopenia ([place])    Personal history of radiation therapy    Sleep apnea 06/06/2018   O2- 2l at bedtime and BiPap @ bedtime    PAST SURGICAL HISTORY: Past Surgical History:  Procedure Laterality Date   BREAST BIOPSY Left 2/136/2018   INVASIVE MAMMARY CARCINOMA   BREAST BIOPSY Right 05/25/2016   FIBROCYSTIC CHANGE WITH CALCIFICATIONS    BREAST BIOPSY Right 05/29/2020   Affirm bx-"X" clip-FIBROADENOMA WITH ASSOCIATED CALCIFICATIONS. - NEGATIVE   BREAST EXCISIONAL BIOPSY Left 06/04/2016   INVASIVE MAMMARY CARCINOMA.    BREAST LUMPECTOMY Left 06/04/2016   INVASIVE MAMMARY CARCINOMA.    CARDIAC CATHETERIZATION     CARDIAC CATHETERIZATION N/A 09/24/2015   Procedure: Left Heart Cath and Coronary Angiography;  Surgeon: Isaias Cowman, MD;  Location: [facility];  Service: Cardiovascular;  Laterality: N/A;   CARDIAC CATHETERIZATION N/A 09/24/2015   Procedure: Coronary Stent Intervention;  Surgeon: Isaias Cowman, MD;  Location: [facility];  Service: Cardiovascular;  Laterality: N/A;   CHOLECYSTECTOMY  11/26   08/1970   COLONOSCOPY WITH PROPOFOL N/A 07/08/2021   Procedure: COLONOSCOPY WITH PROPOFOL;  Surgeon: Toledo, Benay Pike, MD;  Location: [facility] ENDOSCOPY;  Service: Gastroenterology;  Laterality: N/A;   DILATATION & CURETTAGE/HYSTEROSCOPY WITH MYOSURE N/A 05/05/2015   Procedure: Hysteroscopy and endometrial curretage;  Surgeon: Boykin Nearing, MD;  Location: [facility] ORS;  Service: Gynecology;  Laterality: N/A;   DILATION AND CURETTAGE OF UTERUS     EXCISION MASS FROM NECK  Right    Dr. Tami Ribas   EYE SURGERY  05/29/2020   bil cataract 9/08   HAMMER TOE SURGERY Right     IR BONE MARROW BIOPSY & ASPIRATION  09/09/2021   IR IMAGING GUIDED PORT INSERTION  09/09/2021   JOINT REPLACEMENT Bilateral 2008, 11/26/ 2012   Partial Knee Replacements   LEFT HEART CATH AND CORONARY ANGIOGRAPHY N/A 10/25/2017   Procedure: LEFT HEART CATH AND CORONARY ANGIOGRAPHY;  Surgeon: Teodoro Spray, MD;  Location: [facility];  Service: Cardiovascular;  Laterality: N/A;   LEFT HEART CATH AND CORONARY ANGIOGRAPHY N/A 10/26/2017   Procedure: LEFT HEART CATH AND CORONARY ANGIOGRAPHY;  Surgeon: Isaias Cowman, MD;  Location: [facility];  Service: Cardiovascular;  Laterality: N/A;   PARTIAL MASTECTOMY WITH NEEDLE LOCALIZATION Left 06/04/2016   Procedure: PARTIAL MASTECTOMY WITH NEEDLE LOCALIZATION;  Surgeon: Leonie Green, MD;  Location: [facility] ORS;  Service: General;  Laterality: Left;   SCLERAL BUCKLE  02/16/2011   Procedure: SCLERAL BUCKLE;  Surgeon: Hayden Pedro, MD;  Location: [facility];  Service: Ophthalmology;  Laterality: Left;  Scleral Buckle Left Eye with Headscope Laser   SENTINEL NODE BIOPSY Left 06/04/2016   Procedure: SENTINEL NODE BIOPSY;  Surgeon: Leonie Green, MD;  Location: [facility] ORS;  Service: General;  Laterality: Left;    FAMILY HISTORY Family History  Problem Relation Age  of Onset   Breast cancer Mother 66   Heart disease Father    Breast cancer Paternal Aunt 24   Breast cancer Cousin    Anesthesia problems Neg Hx    Hypotension Neg Hx    Malignant hyperthermia Neg Hx    Pseudochol deficiency Neg Hx     GYNECOLOGIC HISTORY:  No LMP recorded. Patient is postmenopausal.     ADVANCED DIRECTIVES:    HEALTH MAINTENANCE: Social History   Tobacco Use   Smoking status: Never   Smokeless tobacco: Never  Vaping Use   Vaping Use: Never used  Substance Use Topics   Alcohol use: No   Drug use: No     Colonoscopy:  PAP:  Bone density:  Mammogram: November 2016  Allergies  Allergen Reactions   Codeine Other (See  Comments)    Stroke like symptoms   Tramadol Itching    Current Outpatient Medications  Medication Sig Dispense Refill   acetaminophen (TYLENOL) 500 MG tablet Take 500 mg by mouth every 4 (four) hours as needed.     allopurinol (ZYLOPRIM) 300 MG tablet Take 1 tablet (300 mg total) by mouth daily. 30 tablet 3   amLODipine (NORVASC) 10 MG tablet Take 1 tablet (10 mg total) by mouth daily. 30 tablet 0   apixaban (ELIQUIS) 5 MG TABS tablet Take 5 mg by mouth 2 (two) times daily.     atorvastatin (LIPITOR) 40 MG tablet Take 1 tablet (40 mg total) by mouth at bedtime.     famotidine (PEPCID) 40 MG tablet Take 40 mg by mouth at bedtime.     Fluticasone-Salmeterol (ADVAIR) 100-50 MCG/DOSE AEPB Inhale 1 puff into the lungs 2 (two) times daily as needed (for asthma.).     isosorbide mononitrate (IMDUR) 30 MG 24 hr tablet Take 30 mg by mouth daily.     letrozole (FEMARA) 2.5 MG tablet TAKE 1 TABLET BY MOUTH ONCE DAILY 90 tablet 3   levothyroxine (SYNTHROID) 125 MCG tablet Take 125 mcg by mouth daily.     lidocaine-prilocaine (EMLA) cream Apply to affected area once 30 g 3   magic mouthwash (nystatin, hydrocortisone, diphenhydrAMINE) suspension Swish and spit 5 mLs 3 (three) times daily as needed for mouth pain. 540 mL 0   ondansetron (ZOFRAN) 8 MG tablet Take 1 tablet (8 mg total) by mouth 2 (two) times daily as needed for refractory nausea / vomiting. Start on day 3 after cyclophosphamide chemotherapy. 60 tablet 1   potassium chloride (KLOR-CON) 10 MEQ tablet Take 1 tablet (10 mEq total) by mouth 2 (two) times daily.     prochlorperazine (COMPAZINE) 10 MG tablet Take 1 tablet (10 mg total) by mouth every 6 (six) hours as needed (Nausea or vomiting). 30 tablet 6   RABEprazole (ACIPHEX) 20 MG tablet Take 20 mg by mouth daily.     sertraline (ZOLOFT) 50 MG tablet Take 50 mg by mouth at bedtime.     sotalol (BETAPACE) 80 MG tablet Take 80 mg by mouth 2 (two) times daily.     diltiazem (CARDIZEM) 30 MG  tablet Take 30 mg by mouth 2 (two) times daily as needed (for breakthrough afib).  (Patient not taking: Reported on 07/08/2021)     ferrous sulfate 325 (65 FE) MG tablet Take 325 mg by mouth once a week. (Patient not taking: Reported on 09/23/2021)     nitroGLYCERIN (NITROSTAT) 0.4 MG SL tablet Place 0.4 mg under the tongue every 5 (five) minutes as needed for chest pain. Maximum  3 doses, If no relief call md or 911. (Patient not taking: Reported on 09/23/2021)     predniSONE (DELTASONE) 20 MG tablet Take 5 tablets (100 mg total) by mouth daily. Take with food on days 1-5 of chemotherapy. (Patient not taking: Reported on 09/23/2021) 25 tablet 5   torsemide (DEMADEX) 20 MG tablet Take 10 mg by mouth as directed. Take on Tuesday and Friday (Patient not taking: Reported on 09/23/2021)     No current facility-administered medications for this visit.    OBJECTIVE: Resp 16   Ht 5' 2"  (1.575 m)   Wt 166 lb 6.4 oz (75.5 kg)   BMI 30.43 kg/m    Body mass index is 30.43 kg/m.    ECOG FS:1 - Symptomatic but completely ambulatory  Physical Exam Constitutional:      Appearance: Normal appearance.  Cardiovascular:     Rate and Rhythm: Normal rate and regular rhythm.  Pulmonary:     Effort: Pulmonary effort is normal.     Breath sounds: Normal breath sounds.  Abdominal:     General: Bowel sounds are normal.     Palpations: Abdomen is soft.  Musculoskeletal:        General: No swelling. Normal range of motion.  Neurological:     Mental Status: She is alert and oriented to person, place, and time. Mental status is at baseline.    LAB RESULTS:   STUDIES: No results found.  ASSESSMENT: Stage III diffuse large B-cell lymphoma.  PLAN:    Stage III diffuse large B-cell lymphoma:  New primary.  Bone marrow biopsy did not reveal any evidence of lymphoma.  MUGA scan from 09/15/2021 revealed an EF of 75%.  Repeat in September 2023.  Patient will benefit from 6 cycles of R-CHOP every 21 days with Udenyca  support.  We will reimage with PET scan after cycle 4.  Despite hospitalization for UTI and COVID she tolerated treatment fairly well.  Reviewed labs with patient and daughter today.  Proceed with next cycle of treatment.  Return to clinic in 3 weeks for follow-up Dr. Grayland Ormond, labs and cycle 3 R-CHOP.  Stage Ia breast cancer- Left breast.  Underwent lumpectomy on 06/04/2016.  MammaPrint was low risk therefore did not require chemo.  Had radiation and completed 5 years of letrozole in June 2023.  Most recent mammogram from 05/20/2021 was reported as BI-RADS 1.  Repeat in March 2024.  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  Did not require chemotherapy.   Osteopenia- Most recent bone density from 11/13/2020 showed a T score of -2.0 which is slightly worse from previous which was -1.7.  Currently taking calcium and vitamin D supplements.  We will repeat next month-scheduled on 11/16/2021.  Anemia- Hemoglobin has improved to 10.7.  Proceed with treatment as above.  Leukocytosis- Multifactorial.  Likely secondary to steroids, recent UTI and COVID-19 infection and Udenyca.  No signs of active infection.  Continue to monitor.  Thrombocytopenia- Resolved.  Platelet count 387,000.   UTI- Has fully recovered and feels back to normal.  Discussed staying hydrated and coming in for IV fluids on her weeks off from treatment.  Plan on bringing her back next Tuesday or Wednesday for lab work and IV fluids.  We will schedule out additional appointments as needed.  Disposition-return to clinic in on Tuesday for lab work and IV fluids.  Keep all other scheduled appointments.  I spent 25 minutes dedicated to the care of this  patient (face-to-face and non-face-to-face) on the date of the encounter to include what is described in the assessment and plan.  Patient expressed understanding and was in agreement with this plan. She also understands that She can call  clinic at any time with any questions, concerns, or complaints.   Jacquelin Hawking, NP   10/08/2021 8:42 AM

## 2021-10-08 NOTE — Progress Notes (Signed)
Nutrition Assessment   Reason for Assessment:  Weight loss, on chemotherapy   ASSESSMENT:   Add on to schedule today  76 year old female with large B-cell lymphoma.  Past medical history of DM, HTN, CAD, afib, GERD, breast cancer, 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2016).  Noted recent hospitalization for UTI and COVID.  Patient receiving R-CHOP.    Met with patient during infusion.  Patient reports that her appetite is better.  Is trying to eat more.  Drinks glucerna shakes.  Said that during admission only able to really eat sherbet and pudding.  Typically ordered a meal but by the time it go to her she did not want it.     Medications: compazine, pepcid, ferrus sulfate, zofran, predinsone, KCL   Labs: glucose 134   Anthropometrics:   Height: 62 inches Weight: 166 lb 6.4 oz 176 lb on 6/29 185 lb on 4/17 BMI: 30  10% weight loss in the last 3 months, significant   Estimated Energy Needs  Kcals: 4720-7218 Protein: 94-112 g Fluid: 1875-2250 ml/d   NUTRITION DIAGNOSIS: Inadequate oral intake related to cancer, recent UTI, COVID as evidenced by 10% weight loss in the last 3 months and decreased intake   INTERVENTION:  Discussed importance of good nutrition during treatment and maintaining weight Continue glucerna shakes, coupons given. Encouraged good sources of protein at every meal/snack Contact information provided   MONITORING, EVALUATION, GOAL: weight trends, intake   Next Visit: Thursday, August 10 during infusion  Stacey Stone B. Zenia Resides, [place], [place] Registered Dietitian [phone]

## 2021-10-09 ENCOUNTER — Inpatient Hospital Stay: Payer: No Typology Code available for payment source

## 2021-10-09 DIAGNOSIS — Z5111 Encounter for antineoplastic chemotherapy: Secondary | ICD-10-CM | POA: Diagnosis not present

## 2021-10-09 DIAGNOSIS — C8338 Diffuse large B-cell lymphoma, lymph nodes of multiple sites: Secondary | ICD-10-CM

## 2021-10-09 MED ORDER — PEGFILGRASTIM INJECTION 6 MG/0.6ML ~~LOC~~
6.0000 mg | PREFILLED_SYRINGE | Freq: Once | SUBCUTANEOUS | Status: AC
  Administered 2021-10-09: 6 mg via SUBCUTANEOUS
  Filled 2021-10-09: qty 0.6

## 2021-10-12 ENCOUNTER — Other Ambulatory Visit: Payer: Self-pay

## 2021-10-13 ENCOUNTER — Inpatient Hospital Stay: Payer: No Typology Code available for payment source

## 2021-10-13 ENCOUNTER — Encounter: Payer: Self-pay | Admitting: Oncology

## 2021-10-13 DIAGNOSIS — C8338 Diffuse large B-cell lymphoma, lymph nodes of multiple sites: Secondary | ICD-10-CM

## 2021-10-13 DIAGNOSIS — E860 Dehydration: Secondary | ICD-10-CM

## 2021-10-13 DIAGNOSIS — Z5111 Encounter for antineoplastic chemotherapy: Secondary | ICD-10-CM | POA: Diagnosis not present

## 2021-10-13 LAB — CBC WITH DIFFERENTIAL/PLATELET
Abs Immature Granulocytes: 2.67 10*3/uL — ABNORMAL HIGH (ref 0.00–0.07)
Basophils Absolute: 0 10*3/uL (ref 0.0–0.1)
Basophils Relative: 0 %
Eosinophils Absolute: 0 10*3/uL (ref 0.0–0.5)
Eosinophils Relative: 0 %
HCT: 28 % — ABNORMAL LOW (ref 36.0–46.0)
Hemoglobin: 8.9 g/dL — ABNORMAL LOW (ref 12.0–15.0)
Immature Granulocytes: 12 %
Lymphocytes Relative: 2 %
Lymphs Abs: 0.4 10*3/uL — ABNORMAL LOW (ref 0.7–4.0)
MCH: 25 pg — ABNORMAL LOW (ref 26.0–34.0)
MCHC: 31.8 g/dL (ref 30.0–36.0)
MCV: 78.7 fL — ABNORMAL LOW (ref 80.0–100.0)
Monocytes Absolute: 0 10*3/uL — ABNORMAL LOW (ref 0.1–1.0)
Monocytes Relative: 0 %
Neutro Abs: 18.5 10*3/uL — ABNORMAL HIGH (ref 1.7–7.7)
Neutrophils Relative %: 86 %
Platelets: 189 10*3/uL (ref 150–400)
RBC: 3.56 MIL/uL — ABNORMAL LOW (ref 3.87–5.11)
RDW: 20.3 % — ABNORMAL HIGH (ref 11.5–15.5)
Smear Review: NORMAL
WBC: 21.6 10*3/uL — ABNORMAL HIGH (ref 4.0–10.5)
nRBC: 0 % (ref 0.0–0.2)

## 2021-10-13 LAB — COMPREHENSIVE METABOLIC PANEL
ALT: 40 U/L (ref 0–44)
AST: 50 U/L — ABNORMAL HIGH (ref 15–41)
Albumin: 2.7 g/dL — ABNORMAL LOW (ref 3.5–5.0)
Alkaline Phosphatase: 135 U/L — ABNORMAL HIGH (ref 38–126)
Anion gap: 7 (ref 5–15)
BUN: 22 mg/dL (ref 8–23)
CO2: 25 mmol/L (ref 22–32)
Calcium: 8.5 mg/dL — ABNORMAL LOW (ref 8.9–10.3)
Chloride: 105 mmol/L (ref 98–111)
Creatinine, Ser: 0.58 mg/dL (ref 0.44–1.00)
GFR, Estimated: >60 mL/min (ref >60)
Glucose, Bld: 106 mg/dL — ABNORMAL HIGH (ref 70–99)
Potassium: 3.8 mmol/L (ref 3.5–5.1)
Sodium: 137 mmol/L (ref 135–145)
Total Bilirubin: 0.8 mg/dL (ref 0.3–1.2)
Total Protein: 5.7 g/dL — ABNORMAL LOW (ref 6.5–8.1)

## 2021-10-13 MED ORDER — HEPARIN SOD (PORK) LOCK FLUSH 100 UNIT/ML IV SOLN
500.0000 [IU] | Freq: Once | INTRAVENOUS | Status: AC | PRN
  Administered 2021-10-13: 500 [IU]
  Filled 2021-10-13: qty 5

## 2021-10-13 MED ORDER — SODIUM CHLORIDE 0.9 % IV SOLN
Freq: Once | INTRAVENOUS | Status: AC
  Filled 2021-10-13: qty 250

## 2021-10-13 MED ORDER — SODIUM CHLORIDE 0.9% FLUSH
10.0000 mL | Freq: Once | INTRAVENOUS | Status: AC | PRN
  Administered 2021-10-13: 10 mL
  Filled 2021-10-13: qty 10

## 2021-10-13 NOTE — Progress Notes (Signed)
Feeling well. Eating good. Trying to drink more liquids.Has constipation that is relieved by miralax. VSS are good today. Discharged to home at completion of IV hydration.

## 2021-10-14 ENCOUNTER — Inpatient Hospital Stay: Payer: No Typology Code available for payment source

## 2021-10-14 ENCOUNTER — Telehealth: Payer: Self-pay | Admitting: *Deleted

## 2021-10-14 ENCOUNTER — Inpatient Hospital Stay (HOSPITAL_BASED_OUTPATIENT_CLINIC_OR_DEPARTMENT_OTHER): Payer: No Typology Code available for payment source | Admitting: Hospice and Palliative Medicine

## 2021-10-14 ENCOUNTER — Other Ambulatory Visit: Payer: Self-pay | Admitting: *Deleted

## 2021-10-14 ENCOUNTER — Encounter: Payer: Self-pay | Admitting: Hospice and Palliative Medicine

## 2021-10-14 ENCOUNTER — Other Ambulatory Visit: Payer: Self-pay

## 2021-10-14 ENCOUNTER — Other Ambulatory Visit: Payer: 59

## 2021-10-14 VITALS — BP 93/69 | HR 80 | Temp 98.3°F | Resp 16 | Ht 62.0 in | Wt 166.0 lb

## 2021-10-14 VITALS — BP 118/75 | HR 75 | Temp 97.6°F | Resp 16

## 2021-10-14 DIAGNOSIS — E860 Dehydration: Secondary | ICD-10-CM

## 2021-10-14 DIAGNOSIS — C8338 Diffuse large B-cell lymphoma, lymph nodes of multiple sites: Secondary | ICD-10-CM

## 2021-10-14 DIAGNOSIS — R0602 Shortness of breath: Secondary | ICD-10-CM

## 2021-10-14 DIAGNOSIS — R350 Frequency of micturition: Secondary | ICD-10-CM

## 2021-10-14 DIAGNOSIS — Z95828 Presence of other vascular implants and grafts: Secondary | ICD-10-CM

## 2021-10-14 DIAGNOSIS — N390 Urinary tract infection, site not specified: Secondary | ICD-10-CM

## 2021-10-14 DIAGNOSIS — Z5111 Encounter for antineoplastic chemotherapy: Secondary | ICD-10-CM | POA: Diagnosis not present

## 2021-10-14 DIAGNOSIS — I951 Orthostatic hypotension: Secondary | ICD-10-CM

## 2021-10-14 DIAGNOSIS — R300 Dysuria: Secondary | ICD-10-CM

## 2021-10-14 LAB — CBC WITH DIFFERENTIAL/PLATELET
Abs Immature Granulocytes: 0 10*3/uL (ref 0.00–0.07)
Band Neutrophils: 1 %
Basophils Absolute: 0 10*3/uL (ref 0.0–0.1)
Basophils Relative: 0 %
Eosinophils Absolute: 0 10*3/uL (ref 0.0–0.5)
Eosinophils Relative: 0 %
HCT: 30.6 % — ABNORMAL LOW (ref 36.0–46.0)
Hemoglobin: 9.7 g/dL — ABNORMAL LOW (ref 12.0–15.0)
Lymphocytes Relative: 9 %
Lymphs Abs: 0.2 10*3/uL — ABNORMAL LOW (ref 0.7–4.0)
MCH: 24.8 pg — ABNORMAL LOW (ref 26.0–34.0)
MCHC: 31.7 g/dL (ref 30.0–36.0)
MCV: 78.3 fL — ABNORMAL LOW (ref 80.0–100.0)
Monocytes Absolute: 0 10*3/uL — ABNORMAL LOW (ref 0.1–1.0)
Monocytes Relative: 2 %
Neutro Abs: 2 10*3/uL (ref 1.7–7.7)
Neutrophils Relative %: 88 %
Platelets: 137 10*3/uL — ABNORMAL LOW (ref 150–400)
RBC: 3.91 MIL/uL (ref 3.87–5.11)
RDW: 20.5 % — ABNORMAL HIGH (ref 11.5–15.5)
Smear Review: NORMAL
WBC: 2.2 10*3/uL — ABNORMAL LOW (ref 4.0–10.5)
nRBC: 0 % (ref 0.0–0.2)

## 2021-10-14 LAB — URINALYSIS, COMPLETE (UACMP) WITH MICROSCOPIC
Bilirubin Urine: NEGATIVE
Glucose, UA: NEGATIVE mg/dL
Ketones, ur: NEGATIVE mg/dL
Nitrite: NEGATIVE
Protein, ur: NEGATIVE mg/dL
Specific Gravity, Urine: 1.009 (ref 1.005–1.030)
WBC, UA: >50 WBC/hpf — ABNORMAL HIGH (ref 0–5)
pH: 7 (ref 5.0–8.0)

## 2021-10-14 LAB — SAMPLE TO BLOOD BANK

## 2021-10-14 MED ORDER — SODIUM CHLORIDE 0.9 % IV SOLN
INTRAVENOUS | Status: DC
  Filled 2021-10-14 (×2): qty 250

## 2021-10-14 MED ORDER — HEPARIN SOD (PORK) LOCK FLUSH 100 UNIT/ML IV SOLN
500.0000 [IU] | Freq: Once | INTRAVENOUS | Status: AC
  Administered 2021-10-14: 500 [IU]
  Filled 2021-10-14: qty 5

## 2021-10-14 MED ORDER — SODIUM CHLORIDE 0.9% FLUSH
10.0000 mL | Freq: Once | INTRAVENOUS | Status: AC
  Administered 2021-10-14: 10 mL via INTRAVENOUS
  Filled 2021-10-14: qty 10

## 2021-10-14 MED ORDER — SULFAMETHOXAZOLE-TRIMETHOPRIM 800-160 MG PO TABS: 1.0000 | ORAL_TABLET | Freq: Two times a day (BID) | ORAL | 0 refills | Status: DC

## 2021-10-14 NOTE — Progress Notes (Signed)
12:00 PM Pt states her stomach is starting to hurt, described as "sharp". Her brother mentioned all of her symptoms are exactly the same as the previous weds after her chemotherapy. On that Weds abdominal pain became so severe she had to go to ED. Pt has completed IV fluids, running at [facility]. 12:36 Abdominal pain is better, has not had a BM or flatus since pain began.

## 2021-10-14 NOTE — Telephone Encounter (Signed)
Spoke with patient. Patient feels more short of breath in comparison to yesterday. Hgb yesterday at 8.9.  will bring pt to [facility] to check a u/a and c&S and blood counts.

## 2021-10-14 NOTE — Progress Notes (Signed)
Symptom Management and [place] at [facility] Telephone:([phone]) [phone] Fax:([phone]) [phone]  Patient Care Team: Rusty Aus, MD as PCP - General (Internal Medicine) Isaias Cowman, MD as Consulting Physician (Cardiology) Lloyd Huger, MD as Consulting Physician (Oncology)   NAME OF PATIENT: Stacey Stone  [id number]  [birth year]   DATE OF VISIT: 10/14/21  REASON FOR CONSULT: MAELANI YARBRO is a 76 y.o. 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  Patient was started on systemic chemotherapy.   Patient was hospitalized 09/23/2021-09/28/2021 with UTI and COVID.  Patient received IV antibiotics and remdesivir.  INTERVAL HISTORY: Patient was seen by Rulon Abide, NP on 10/08/2021 for cycle 2 of R-CHOP.  Patient was seen in fluid clinic yesterday at that time was feeling good with stable vitals.  She presents to clinic today with concerns of dysuria, mild shortness of breath, and sore throat.  Patient reports that this is how she felt prior to most recent hospitalization.  Daughter is concerned that patient rapidly declined previously leading to her admission with sepsis.  Today, patient says that she started having dysuria and urinary frequency after IV fluids yesterday.  She denies fever or chills.  This morning she noticed a sore throat.  She worked with PT and felt a little fatigued and winded with exertion.  She had a chronic cough but that is unchanged in characteristic or severity.  Denies any neurologic complaints. Denies any easy bleeding or bruising. Reports fair appetite and denies weight loss. Denies chest pain. Denies any nausea, vomiting, constipation, or diarrhea.  Patient offers no  further specific complaints today.  SOCIAL HISTORY:     reports that she has never smoked. She has never used smokeless tobacco. She reports that she does not drink alcohol and does not use drugs.  Patient lives at home alone.  She has a daughter and brother who are involved in her care.  ADVANCE DIRECTIVES:    CODE STATUS:    PAST MEDICAL HISTORY: Past Medical History:  Diagnosis Date   Abdominal pain, left lower quadrant    epiploic appendagitis   Anginal pain ([place])    Arthritis 06/06/2018   knee   Asthma    mild   Atrial fibrillation (HCC)    Breast cancer ([place])    Coronary artery disease 06/06/2018   1 artery 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 Mamie Nick. 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   No pertinent past medical history    Pancytopenia ([place])    Personal history of radiation therapy    Sleep apnea 06/06/2018   O2- 2l at bedtime and BiPap @ bedtime    PAST SURGICAL HISTORY:  Past Surgical History:  Procedure Laterality Date   BREAST BIOPSY Left 2/136/2018   INVASIVE MAMMARY CARCINOMA   BREAST BIOPSY Right 05/25/2016   FIBROCYSTIC CHANGE WITH CALCIFICATIONS    BREAST BIOPSY Right 05/29/2020   Affirm bx-"X" clip-FIBROADENOMA WITH ASSOCIATED CALCIFICATIONS. - NEGATIVE   BREAST EXCISIONAL BIOPSY Left 06/04/2016   INVASIVE MAMMARY  CARCINOMA.    BREAST LUMPECTOMY Left 06/04/2016   INVASIVE MAMMARY CARCINOMA.    CARDIAC CATHETERIZATION     CARDIAC CATHETERIZATION N/A 09/24/2015   Procedure: Left Heart Cath and Coronary Angiography;  Surgeon: Isaias Cowman, MD;  Location: [facility];  Service: Cardiovascular;  Laterality: N/A;   CARDIAC CATHETERIZATION N/A 09/24/2015   Procedure: Coronary Stent  Intervention;  Surgeon: Isaias Cowman, MD;  Location: [facility];  Service: Cardiovascular;  Laterality: N/A;   CHOLECYSTECTOMY  11/26   08/1970   COLONOSCOPY WITH PROPOFOL N/A 07/08/2021   Procedure: COLONOSCOPY WITH PROPOFOL;  Surgeon: Toledo, Benay Pike, MD;  Location: [facility];  Service: Gastroenterology;  Laterality: N/A;   DILATATION & CURETTAGE/HYSTEROSCOPY WITH MYOSURE N/A 05/05/2015   Procedure: Hysteroscopy and endometrial curretage;  Surgeon: Boykin Nearing, MD;  Location: [facility];  Service: Gynecology;  Laterality: N/A;   DILATION AND CURETTAGE OF UTERUS     EXCISION MASS FROM NECK  Right    Dr. Tami Ribas   EYE SURGERY  05/29/2020   bil cataract 9/08   HAMMER TOE SURGERY Right    IR BONE MARROW BIOPSY & ASPIRATION  09/09/2021   IR IMAGING GUIDED PORT INSERTION  09/09/2021   JOINT REPLACEMENT Bilateral 2008, 11/26/ 2012   Partial Knee Replacements   LEFT HEART CATH AND CORONARY ANGIOGRAPHY N/A 10/25/2017   Procedure: LEFT HEART CATH AND CORONARY ANGIOGRAPHY;  Surgeon: Teodoro Spray, MD;  Location: [facility];  Service: Cardiovascular;  Laterality: N/A;   LEFT HEART CATH AND CORONARY ANGIOGRAPHY N/A 10/26/2017   Procedure: LEFT HEART CATH AND CORONARY ANGIOGRAPHY;  Surgeon: Isaias Cowman, MD;  Location: [facility];  Service: Cardiovascular;  Laterality: N/A;   PARTIAL MASTECTOMY WITH NEEDLE LOCALIZATION Left 06/04/2016   Procedure: PARTIAL MASTECTOMY WITH NEEDLE LOCALIZATION;  Surgeon: Leonie Green, MD;  Location: [facility];  Service: General;  Laterality: Left;   SCLERAL BUCKLE  02/16/2011   Procedure: SCLERAL BUCKLE;  Surgeon: Hayden Pedro, MD;  Location: [facility];  Service: Ophthalmology;  Laterality: Left;  Scleral Buckle Left Eye with Headscope Laser   SENTINEL NODE BIOPSY Left 06/04/2016   Procedure: SENTINEL NODE BIOPSY;  Surgeon: Leonie Green, MD;  Location: [facility];  Service: General;  Laterality: Left;     HEMATOLOGY/ONCOLOGY HISTORY:  Oncology History Overview Note  Pathologic stage Ia ER positive, 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 MammaPrint was reported as low risk, therefore she did not require adjuvant chemotherapy. She completed adjuvant XRT. Will complete 5 year of letrozole in July 2023.   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  Patient's most recent CT scan on April 28, 2018 revealed no evidence of progressive or recurrent disease.    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/05/2014 Initial Diagnosis   MALT (mucosa associated lymphoid tissue)   Primary cancer of upper outer quadrant of left female breast ([place])  05/09/2016 Initial Diagnosis   Primary cancer of upper outer quadrant of left female breast ([place])   DLBCL (diffuse large B cell lymphoma) ([place])  09/10/2021 Initial Diagnosis   DLBCL (diffuse large B cell lymphoma) ([place])   09/17/2021 -  Chemotherapy   Patient is on Treatment Plan : NON-HODGKINS LYMPHOMA R-CHOP q21d     09/29/2021 Cancer Staging   Staging form: Hodgkin and Non-Hodgkin  Lymphoma, AJCC 8th Edition - Clinical stage from 09/29/2021: Stage III (Diffuse large B-cell lymphoma) - Signed by Lloyd Huger, MD on 09/29/2021 Stage prefix: Initial diagnosis     ALLERGIES:  is allergic to codeine and tramadol.  MEDICATIONS:  Current Outpatient Medications  Medication Sig Dispense Refill   acetaminophen (TYLENOL) 500 MG tablet Take 500 mg by mouth every 4 (four) hours as needed.     allopurinol (ZYLOPRIM) 300 MG tablet Take 1 tablet (300 mg total) by mouth daily. 30 tablet 3   amLODipine (NORVASC) 10 MG tablet Take 1 tablet (10 mg total) by mouth daily. 30 tablet 0   apixaban (ELIQUIS) 5 MG TABS tablet Take 5 mg by mouth 2 (two) times daily.     atorvastatin (LIPITOR) 40 MG  tablet Take 1 tablet (40 mg total) by mouth at bedtime.     famotidine (PEPCID) 40 MG tablet Take 40 mg by mouth at bedtime.     Fluticasone-Salmeterol (ADVAIR) 100-50 MCG/DOSE AEPB Inhale 1 puff into the lungs 2 (two) times daily as needed (for asthma.).     isosorbide mononitrate (IMDUR) 30 MG 24 hr tablet Take 30 mg by mouth daily.     letrozole (FEMARA) 2.5 MG tablet TAKE 1 TABLET BY MOUTH ONCE DAILY 90 tablet 3   levothyroxine (SYNTHROID) 125 MCG tablet Take 125 mcg by mouth daily.     lidocaine-prilocaine (EMLA) cream Apply to affected area once 30 g 3   magic mouthwash (nystatin, hydrocortisone, diphenhydrAMINE) suspension Swish and spit 5 mLs 3 (three) times daily as needed for mouth pain. 540 mL 0   ondansetron (ZOFRAN) 8 MG tablet Take 1 tablet (8 mg total) by mouth 2 (two) times daily as needed for refractory nausea / vomiting. Start on day 3 after cyclophosphamide chemotherapy. 60 tablet 1   potassium chloride (KLOR-CON) 10 MEQ tablet Take 1 tablet (10 mEq total) by mouth 2 (two) times daily.     prochlorperazine (COMPAZINE) 10 MG tablet Take 1 tablet (10 mg total) by mouth every 6 (six) hours as needed (Nausea or vomiting). 30 tablet 6   RABEprazole (ACIPHEX) 20 MG tablet Take 20 mg by mouth daily.     sertraline (ZOLOFT) 50 MG tablet Take 50 mg by mouth at bedtime.     sotalol (BETAPACE) 80 MG tablet Take 80 mg by mouth 2 (two) times daily.     diltiazem (CARDIZEM) 30 MG tablet Take 30 mg by mouth 2 (two) times daily as needed (for breakthrough afib).  (Patient not taking: Reported on 07/08/2021)     ferrous sulfate 325 (65 FE) MG tablet Take 325 mg by mouth once a week. (Patient not taking: Reported on 09/23/2021)     nitroGLYCERIN (NITROSTAT) 0.4 MG SL tablet Place 0.4 mg under the tongue every 5 (five) minutes as needed for chest pain. Maximum 3 doses, If no relief call md or 911. (Patient not taking: Reported on 09/23/2021)     predniSONE (DELTASONE) 20 MG tablet Take 5 tablets (100 mg  total) by mouth daily. Take with food on days 1-5 of chemotherapy. (Patient not taking: Reported on 09/23/2021) 25 tablet 5   torsemide (DEMADEX) 20 MG tablet Take 10 mg by mouth as directed. Take on Tuesday and Friday (Patient not taking: Reported on 09/23/2021)     No current facility-administered medications for this visit.   Facility-Administered Medications Ordered in Other Visits  Medication Dose Route Frequency Provider Last Rate Last Admin   0.9 %  sodium chloride  infusion   Intravenous Continuous Antonious Omahoney, Kirt Boys, NP 999 mL/hr at 10/14/21 1121 New Bag at 10/14/21 1121    VITAL SIGNS: BP 93/69 (BP Location: Right Arm, Patient Position: Sitting)   Pulse 80   Temp 98.3 F (36.8 C) (Oral)   Resp 16   Ht 5' 2"  (1.575 m)   Wt 166 lb (75.3 kg)   SpO2 99% Comment: ROOM AIR  BMI 30.36 kg/m  Filed Weights   10/14/21 1045  Weight: 166 lb (75.3 kg)    Estimated body mass index is 30.36 kg/m as calculated from the following:   Height as of this encounter: 5' 2"  (1.575 m).   Weight as of this encounter: 166 lb (75.3 kg).  LABS: CBC:    Component Value Date/Time   WBC 2.2 (L) 10/14/2021 1015   HGB 9.7 (L) 10/14/2021 1015   HGB 13.3 07/03/2014 1503   HCT 30.6 (L) 10/14/2021 1015   HCT 40.8 07/03/2014 1503   PLT 137 (L) 10/14/2021 1015   PLT 240 07/03/2014 1503   MCV 78.3 (L) 10/14/2021 1015   MCV 80 07/03/2014 1503   NEUTROABS 2.0 10/14/2021 1015   NEUTROABS 4.4 07/03/2014 1503   LYMPHSABS 0.2 (L) 10/14/2021 1015   LYMPHSABS 2.8 07/03/2014 1503   MONOABS 0.0 (L) 10/14/2021 1015   MONOABS 0.5 07/03/2014 1503   EOSABS 0.0 10/14/2021 1015   EOSABS 0.3 07/03/2014 1503   BASOSABS 0.0 10/14/2021 1015   BASOSABS 0.1 07/03/2014 1503   Comprehensive Metabolic Panel:    Component Value Date/Time   NA 137 10/13/2021 0838   NA 138 07/03/2014 1503   K 3.8 10/13/2021 0838   K 3.9 07/03/2014 1503   CL 105 10/13/2021 0838   CL 103 07/03/2014 1503   CO2 25 10/13/2021 0838   CO2  28 07/03/2014 1503   BUN 22 10/13/2021 0838   BUN 16 07/03/2014 1503   CREATININE 0.58 10/13/2021 0838   CREATININE 1.00 07/03/2014 1503   GLUCOSE 106 (H) 10/13/2021 0838   GLUCOSE 101 (H) 07/03/2014 1503   CALCIUM 8.5 (L) 10/13/2021 0838   CALCIUM 9.0 07/03/2014 1503   AST 50 (H) 10/13/2021 0838   AST 29 07/03/2014 1503   ALT 40 10/13/2021 0838   ALT 34 07/03/2014 1503   ALKPHOS 135 (H) 10/13/2021 0838   ALKPHOS 174 (H) 07/03/2014 1503   BILITOT 0.8 10/13/2021 0838   BILITOT 0.6 07/03/2014 1503   PROT 5.7 (L) 10/13/2021 0838   PROT 7.4 07/03/2014 1503   ALBUMIN 2.7 (L) 10/13/2021 0838   ALBUMIN 4.0 07/03/2014 1503    RADIOGRAPHIC STUDIES: US RENAL  Result Date: 09/25/2021 CLINICAL DATA:  Hydronephrosis EXAM: RENAL / URINARY TRACT ULTRASOUND COMPLETE COMPARISON:  08/19/2021 FINDINGS: Right Kidney: Renal measurements: 10.5 x 5.7 x 6.2 cm = volume: 196 mL. Echogenicity within normal limits. No mass or hydronephrosis visualized. Left Kidney: Renal measurements: 10.8 x 5.8 x 5.8 cm = volume: 190.7 mL. Echogenicity within normal limits. No hydronephrosis. Exophytic 1.9 cm cyst off the interpolar region. Bladder: Appears normal for degree of bladder distention. Other: None. IMPRESSION: 1. No evidence of hydronephrosis or nephrolithiasis. 2. Benign left renal cyst. Otherwise unremarkable appearance of the kidneys. Electronically Signed   By: Randa Ngo M.D.   On: 09/25/2021 21:56   US Abdomen Limited RUQ (LIVER/GB)  Result Date: 09/23/2021 CLINICAL DATA:  Elevated LFTs EXAM: ULTRASOUND ABDOMEN LIMITED RIGHT UPPER QUADRANT COMPARISON:  01/06/2021 FINDINGS: Gallbladder: Surgically removed Common bile duct: Diameter: 5.4 mm. Liver: No  focal lesion identified. Within normal limits in parenchymal echogenicity. 3 mm calcification is noted in the right lobe of the liver stable from prior CT. Portal vein is patent on color Doppler imaging with normal direction of blood flow towards the liver. Other:  None. IMPRESSION: Status post cholecystectomy. No acute abnormality to correspond with the given clinical history is noted. Electronically Signed   By: Inez Catalina M.D.   On: 09/23/2021 23:30   DG Chest Port 1 View  Result Date: 09/23/2021 CLINICAL DATA:  A 76 year old female presents with questionable sepsis. EXAM: PORTABLE CHEST 1 VIEW COMPARISON:  September 07, 2021. FINDINGS: EKG leads project over the chest. RIGHT-sided Port-A-Cath terminates at the caval to atrial junction. Cardiomediastinal contours and hilar structures are stable. Lungs are clear. No visible pneumothorax. On limited assessment there is no acute skeletal finding. IMPRESSION: 1. No active disease. 2. RIGHT-sided Port-A-Cath terminates at the caval to atrial junction. 3. Known adenopathy in the chest and lower neck better seen on prior PET imaging Electronically Signed   By: Zetta Bills M.D.   On: 09/23/2021 19:49   NM Cardiac Muga Rest  Result Date: 09/15/2021 CLINICAL DATA:  Diffuse large B-cell lymphoma, cardiotoxic chemotherapy planned, history of coronary stenting, atrial fibrillation, COPD, prior chemotherapy for breast cancer in 2018 EXAM: NUCLEAR MEDICINE CARDIAC BLOOD POOL IMAGING (MUGA) TECHNIQUE: Cardiac multi-gated acquisition was performed at rest following intravenous injection of Tc-34mlabeled red blood cells. RADIOPHARMACEUTICALS:  22.76 mCi Tc-954mertechnetate in-vitro labeled red blood cells IV COMPARISON:  None Available. FINDINGS: Calculated LEFT ventricular ejection fraction is 75%, upper normal. Study was obtained at a cardiac rate of 67 bpm. Patient was rhythmic during imaging. Cine analysis of the LEFT ventricle in 3 projections demonstrates normal LV wall motion. IMPRESSION: Upper normal LEFT ventricular ejection fraction of 75% with normal LV wall motion. Electronically Signed   By: MaLavonia Dana.D.   On: 09/15/2021 15:34    PERFORMANCE STATUS (ECOG) : 1 - Symptomatic but completely ambulatory  Review of  Systems Unless otherwise noted, a complete review of systems is negative.  Physical Exam General: NAD Cardiovascular: regular rate and rhythm Pulmonary: clear ant fields Abdomen: soft, nontender, + bowel sounds GU: no suprapubic tenderness, no CVA tenderness Extremities: no edema, no joint deformities Skin: no rashes, sacral ulcer Neurological: Weakness but otherwise nonfocal  Exam chaperoned by HeNira ConnRN.  IMPRESSION: Patient was initially noted to be orthostatic.  She received a liter of normal saline with normalization of blood pressure.    UA appears consistent with recurrent UTI.  Patient is nontoxic-appearing in clinic but daughter voiced concerns that patient previously rapidly deteriorated leading to her recent hospitalization.  Daughter says that symptoms were similar as to today.  Discussed option of sending patient back to the ED given family concerns for further evaluation and management versus proceeding with outpatient antibiotics.  Ultimately, patient and family were in agreement with outpatient management.  I would recommend patient have respiratory panel PCR given mild shortness of breath/sore throat.  PLAN: -IV fluids today -Start Bactrim twice daily x10 days -Urine culture and sensitivities pending -Recommend respiratory/COVID PCR -Push oral fluids -Zinc oxide cream to sacral ulcer -RTC tomorrow for labs/fluids  Case and plan discussed with Dr. FiGrayland Ormond Patient expressed understanding and was in agreement with this plan. She also understands that She can call clinic at any time with any questions, concerns, or complaints.   Thank you for allowing me to participate in the care of this  very pleasant patient.   Time Total: 25 minutes  Visit consisted of counseling and education dealing with the complex and emotionally intense issues of symptom management in the setting of serious illness.Greater than 50%  of this time was spent counseling and coordinating  care related to the above assessment and plan.  Signed by: Altha Harm, PhD, NP-C

## 2021-10-14 NOTE — Telephone Encounter (Signed)
Patient called stating that she is not felling well this morning. She reports that she did not sleep well last night and that she started having burning with urination like a UTI an hour ago. Urine is clear and has no odor. No fever. Shortness of breath started when she got up this morning. She is wanting to come in to be seen and states she has transportation now if she can come in.

## 2021-10-14 NOTE — Progress Notes (Signed)
STAGE 1 PRESSURE ULCER noted when toileting the patient. Redness present in sacral area on right in buttock. Sacral silcone foam dressing applied to area. Pt instructed to get barrier cream to prevent further skin break down. Pt educated on wound care/prevention.

## 2021-10-15 ENCOUNTER — Other Ambulatory Visit: Payer: Self-pay

## 2021-10-15 ENCOUNTER — Inpatient Hospital Stay (HOSPITAL_BASED_OUTPATIENT_CLINIC_OR_DEPARTMENT_OTHER): Payer: No Typology Code available for payment source | Admitting: Hospice and Palliative Medicine

## 2021-10-15 ENCOUNTER — Inpatient Hospital Stay: Payer: No Typology Code available for payment source

## 2021-10-15 ENCOUNTER — Other Ambulatory Visit: Payer: Self-pay | Admitting: *Deleted

## 2021-10-15 VITALS — BP 114/69 | HR 87 | Temp 98.9°F | Resp 18

## 2021-10-15 DIAGNOSIS — E860 Dehydration: Secondary | ICD-10-CM | POA: Diagnosis not present

## 2021-10-15 DIAGNOSIS — N390 Urinary tract infection, site not specified: Secondary | ICD-10-CM

## 2021-10-15 DIAGNOSIS — R0602 Shortness of breath: Secondary | ICD-10-CM

## 2021-10-15 DIAGNOSIS — Z5111 Encounter for antineoplastic chemotherapy: Secondary | ICD-10-CM | POA: Diagnosis not present

## 2021-10-15 DIAGNOSIS — C8338 Diffuse large B-cell lymphoma, lymph nodes of multiple sites: Secondary | ICD-10-CM | POA: Diagnosis not present

## 2021-10-15 DIAGNOSIS — I951 Orthostatic hypotension: Secondary | ICD-10-CM

## 2021-10-15 LAB — COMPREHENSIVE METABOLIC PANEL
ALT: 33 U/L (ref 0–44)
AST: 27 U/L (ref 15–41)
Albumin: 2.7 g/dL — ABNORMAL LOW (ref 3.5–5.0)
Alkaline Phosphatase: 121 U/L (ref 38–126)
Anion gap: 6 (ref 5–15)
BUN: 11 mg/dL (ref 8–23)
CO2: 25 mmol/L (ref 22–32)
Calcium: 8.6 mg/dL — ABNORMAL LOW (ref 8.9–10.3)
Chloride: 103 mmol/L (ref 98–111)
Creatinine, Ser: 0.59 mg/dL (ref 0.44–1.00)
GFR, Estimated: >60 mL/min (ref >60)
Glucose, Bld: 137 mg/dL — ABNORMAL HIGH (ref 70–99)
Potassium: 3.7 mmol/L (ref 3.5–5.1)
Sodium: 134 mmol/L — ABNORMAL LOW (ref 135–145)
Total Bilirubin: 0.9 mg/dL (ref 0.3–1.2)
Total Protein: 5.5 g/dL — ABNORMAL LOW (ref 6.5–8.1)

## 2021-10-15 LAB — CBC WITH DIFFERENTIAL/PLATELET
Abs Immature Granulocytes: 0.01 10*3/uL (ref 0.00–0.07)
Basophils Absolute: 0 10*3/uL (ref 0.0–0.1)
Basophils Relative: 5 %
Eosinophils Absolute: 0 10*3/uL (ref 0.0–0.5)
Eosinophils Relative: 5 %
HCT: 26.4 % — ABNORMAL LOW (ref 36.0–46.0)
Hemoglobin: 8.5 g/dL — ABNORMAL LOW (ref 12.0–15.0)
Immature Granulocytes: 5 %
Lymphocytes Relative: 62 %
Lymphs Abs: 0.1 10*3/uL — ABNORMAL LOW (ref 0.7–4.0)
MCH: 25 pg — ABNORMAL LOW (ref 26.0–34.0)
MCHC: 32.2 g/dL (ref 30.0–36.0)
MCV: 77.6 fL — ABNORMAL LOW (ref 80.0–100.0)
Monocytes Absolute: 0 10*3/uL — ABNORMAL LOW (ref 0.1–1.0)
Monocytes Relative: 5 %
Neutro Abs: 0 10*3/uL — CL (ref 1.7–7.7)
Neutrophils Relative %: 18 %
Platelets: 94 10*3/uL — ABNORMAL LOW (ref 150–400)
RBC: 3.4 MIL/uL — ABNORMAL LOW (ref 3.87–5.11)
RDW: 20.2 % — ABNORMAL HIGH (ref 11.5–15.5)
Smear Review: NORMAL
WBC: 0.2 10*3/uL — CL (ref 4.0–10.5)
nRBC: 0 % (ref 0.0–0.2)

## 2021-10-15 MED ORDER — SODIUM CHLORIDE 0.9 % IV SOLN
INTRAVENOUS | Status: DC
  Filled 2021-10-15 (×2): qty 250

## 2021-10-15 MED ORDER — HEPARIN SOD (PORK) LOCK FLUSH 100 UNIT/ML IV SOLN
500.0000 [IU] | Freq: Once | INTRAVENOUS | Status: AC
  Administered 2021-10-15: 500 [IU]
  Filled 2021-10-15: qty 5

## 2021-10-15 MED ORDER — HEPARIN SOD (PORK) LOCK FLUSH 100 UNIT/ML IV SOLN
INTRAVENOUS | Status: AC
  Filled 2021-10-15: qty 5

## 2021-10-15 MED ORDER — SODIUM CHLORIDE 0.9% FLUSH
10.0000 mL | Freq: Once | INTRAVENOUS | Status: AC
  Administered 2021-10-15: 10 mL via INTRAVENOUS
  Filled 2021-10-15: qty 10

## 2021-10-15 NOTE — Patient Instructions (Signed)
Your blood counts demonstrated a low white blood count. You are at high risk for infection. Please make sure others wear mask in front of you  to avoid infection and require hand washing.

## 2021-10-15 NOTE — Progress Notes (Signed)
 Symptom Management and Supportive Care  [facility] [phone] [phone]  Patient Care Team: Miller, Mark F, MD as PCP - General (Internal Medicine) Paraschos, Alexander, MD as Consulting Physician (Cardiology) Finnegan, Timothy J, MD as Consulting Physician (Oncology)   NAME OF PATIENT: Stacey Stone  [id number]  08/10/1945   DATE OF VISIT: 10/15/21  REASON FOR CONSULT: Stacey Stone is a 75 y.o. 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  Patient was started on systemic chemotherapy.   Patient was hospitalized 09/23/2021-09/28/2021 with UTI and COVID.  Patient received IV antibiotics and remdesivir.  INTERVAL HISTORY: Patient was seen yesterday for UTI and started on Bactrim.  Today, patient reports that she is feeling much improved.  She denies any symptomatic complaints at present.  No fever or chills.  Denies any neurologic complaints. Denies any easy bleeding or bruising. Reports fair appetite and denies weight loss. Denies chest pain. Denies any nausea, vomiting, constipation, or diarrhea.  Patient offers no further specific complaints today.  SOCIAL HISTORY:     reports that she has never smoked. She has never used smokeless tobacco. She reports that she does not drink alcohol and does not use drugs.  Patient lives at home alone.  She has a daughter and brother who are involved in her care.  ADVANCE DIRECTIVES:    CODE STATUS:    PAST MEDICAL HISTORY: Past Medical History:  Diagnosis Date   Abdominal pain, left lower quadrant    epiploic appendagitis   Anginal pain (HCC)    Arthritis 06/06/2018   knee   Asthma    mild   Atrial fibrillation (HCC)    Breast  cancer (HCC)    Coronary artery disease 06/06/2018   1 artery 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 P. 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   No pertinent past medical history    Pancytopenia (HCC)    Personal history of radiation therapy    Sleep apnea 06/06/2018   O2- 2l at bedtime and BiPap @ bedtime    PAST SURGICAL HISTORY:  Past Surgical History:  Procedure Laterality Date   BREAST BIOPSY Left 2/136/2018   INVASIVE MAMMARY CARCINOMA   BREAST BIOPSY Right 05/25/2016   FIBROCYSTIC CHANGE WITH CALCIFICATIONS    BREAST BIOPSY Right 05/29/2020   Affirm bx-"X" clip-FIBROADENOMA WITH ASSOCIATED CALCIFICATIONS. - NEGATIVE   BREAST EXCISIONAL BIOPSY Left 06/04/2016   INVASIVE MAMMARY CARCINOMA.    BREAST LUMPECTOMY Left 06/04/2016   INVASIVE MAMMARY CARCINOMA.    CARDIAC CATHETERIZATION     CARDIAC CATHETERIZATION N/A 09/24/2015   Procedure: Left Heart Cath and Coronary Angiography;  Surgeon: Alexander Paraschos, MD;  Location: [facility];  Service: Cardiovascular;  Laterality: N/A;   CARDIAC CATHETERIZATION N/A 09/24/2015   Procedure: Coronary Stent Intervention;  Surgeon: Alexander Paraschos, MD;  Location: [facility];  Service: Cardiovascular;  Laterality: N/A;   CHOLECYSTECTOMY  11/26   08/1970   COLONOSCOPY WITH PROPOFOL N/A 07/08/2021   Procedure: COLONOSCOPY WITH PROPOFOL;    Surgeon: Toledo, Teodoro K, MD;  Location: [facility];  Service: Gastroenterology;  Laterality: N/A;   DILATATION & CURETTAGE/HYSTEROSCOPY WITH MYOSURE N/A 05/05/2015   Procedure: Hysteroscopy and endometrial curretage;  Surgeon: Thomas J Schermerhorn, MD;  Location: [facility];  Service: Gynecology;  Laterality: N/A;   DILATION AND  CURETTAGE OF UTERUS     EXCISION MASS FROM NECK  Right    Dr. McQueen   EYE SURGERY  05/29/2020   bil cataract 9/08   HAMMER TOE SURGERY Right    IR BONE MARROW BIOPSY & ASPIRATION  09/09/2021   IR IMAGING GUIDED PORT INSERTION  09/09/2021   JOINT REPLACEMENT Bilateral 2008, 11/26/ 2012   Partial Knee Replacements   LEFT HEART CATH AND CORONARY ANGIOGRAPHY N/A 10/25/2017   Procedure: LEFT HEART CATH AND CORONARY ANGIOGRAPHY;  Surgeon: Fath, Kenneth A, MD;  Location: [facility];  Service: Cardiovascular;  Laterality: N/A;   LEFT HEART CATH AND CORONARY ANGIOGRAPHY N/A 10/26/2017   Procedure: LEFT HEART CATH AND CORONARY ANGIOGRAPHY;  Surgeon: Paraschos, Alexander, MD;  Location: [facility];  Service: Cardiovascular;  Laterality: N/A;   PARTIAL MASTECTOMY WITH NEEDLE LOCALIZATION Left 06/04/2016   Procedure: PARTIAL MASTECTOMY WITH NEEDLE LOCALIZATION;  Surgeon: Jarvis Wilton Smith, MD;  Location: [facility];  Service: General;  Laterality: Left;   SCLERAL BUCKLE  02/16/2011   Procedure: SCLERAL BUCKLE;  Surgeon: John D Matthews, MD;  Location: [facility];  Service: Ophthalmology;  Laterality: Left;  Scleral Buckle Left Eye with Headscope Laser   SENTINEL NODE BIOPSY Left 06/04/2016   Procedure: SENTINEL NODE BIOPSY;  Surgeon: Jarvis Wilton Smith, MD;  Location: [facility];  Service: General;  Laterality: Left;    HEMATOLOGY/ONCOLOGY HISTORY:  Oncology History Overview Note  Pathologic stage Ia ER positive, 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 MammaPrint was reported as low risk, therefore she did not require adjuvant chemotherapy. She completed adjuvant XRT. Will complete 5 year of letrozole in July 2023.   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  Patient's most recent CT scan on  April 28, 2018 revealed no evidence of progressive or recurrent disease.    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 Diagnosis   MALT (mucosa associated lymphoid tissue)   Primary cancer of upper outer quadrant of left female breast (HCC)  05/09/2016 Initial Diagnosis   Primary cancer of upper outer quadrant of left female breast (HCC)   DLBCL (diffuse large B cell lymphoma) (HCC)  09/10/2021 Initial Diagnosis   DLBCL (diffuse large B cell lymphoma) (HCC)   09/17/2021 -  Chemotherapy   Patient is on Treatment Plan : NON-HODGKINS LYMPHOMA R-CHOP q21d     09/29/2021 Cancer Staging   Staging form: Hodgkin and Non-Hodgkin Lymphoma, AJCC 8th Edition - Clinical stage from 09/29/2021: Stage III (Diffuse large B-cell lymphoma) - Signed by Finnegan, Timothy J, MD on 09/29/2021 Stage prefix: Initial diagnosis     ALLERGIES:  is allergic to codeine and tramadol.  MEDICATIONS:  Current Outpatient Medications  Medication Sig Dispense Refill   acetaminophen (TYLENOL) 500 MG tablet Take 500 mg by mouth every 4 (four) hours as needed.     allopurinol (ZYLOPRIM) 300 MG tablet Take 1 tablet (300 mg total) by mouth daily. 30 tablet 3   amLODipine (NORVASC) 10 MG tablet Take 1 tablet (10 mg total)   by mouth daily. 30 tablet 0   apixaban (ELIQUIS) 5 MG TABS tablet Take 5 mg by mouth 2 (two) times daily.     atorvastatin (LIPITOR) 40 MG tablet Take 1 tablet (40 mg total) by mouth at bedtime.     diltiazem (CARDIZEM) 30 MG tablet Take 30 mg by mouth 2 (two) times daily as needed (for breakthrough afib).  (Patient not taking: Reported on 07/08/2021)     famotidine (PEPCID) 40 MG tablet Take 40 mg by mouth at bedtime.     ferrous sulfate 325 (65 FE) MG tablet Take 325 mg by mouth once a week. (Patient not taking: Reported on 09/23/2021)     Fluticasone-Salmeterol (ADVAIR) 100-50 MCG/DOSE AEPB Inhale 1 puff into the lungs 2 (two) times daily as needed (for asthma.).     isosorbide mononitrate (IMDUR) 30 MG 24 hr  tablet Take 30 mg by mouth daily.     letrozole (FEMARA) 2.5 MG tablet TAKE 1 TABLET BY MOUTH ONCE DAILY 90 tablet 3   levothyroxine (SYNTHROID) 125 MCG tablet Take 125 mcg by mouth daily.     lidocaine-prilocaine (EMLA) cream Apply to affected area once 30 g 3   magic mouthwash (nystatin, hydrocortisone, diphenhydrAMINE) suspension Swish and spit 5 mLs 3 (three) times daily as needed for mouth pain. 540 mL 0   nitroGLYCERIN (NITROSTAT) 0.4 MG SL tablet Place 0.4 mg under the tongue every 5 (five) minutes as needed for chest pain. Maximum 3 doses, If no relief call md or 911. (Patient not taking: Reported on 09/23/2021)     ondansetron (ZOFRAN) 8 MG tablet Take 1 tablet (8 mg total) by mouth 2 (two) times daily as needed for refractory nausea / vomiting. Start on day 3 after cyclophosphamide chemotherapy. 60 tablet 1   potassium chloride (KLOR-CON) 10 MEQ tablet Take 1 tablet (10 mEq total) by mouth 2 (two) times daily.     predniSONE (DELTASONE) 20 MG tablet Take 5 tablets (100 mg total) by mouth daily. Take with food on days 1-5 of chemotherapy. (Patient not taking: Reported on 09/23/2021) 25 tablet 5   prochlorperazine (COMPAZINE) 10 MG tablet Take 1 tablet (10 mg total) by mouth every 6 (six) hours as needed (Nausea or vomiting). 30 tablet 6   RABEprazole (ACIPHEX) 20 MG tablet Take 20 mg by mouth daily.     sertraline (ZOLOFT) 50 MG tablet Take 50 mg by mouth at bedtime.     sotalol (BETAPACE) 80 MG tablet Take 80 mg by mouth 2 (two) times daily.     sulfamethoxazole-trimethoprim (BACTRIM DS) 800-160 MG tablet Take 1 tablet by mouth 2 (two) times daily. 20 tablet 0   torsemide (DEMADEX) 20 MG tablet Take 10 mg by mouth as directed. Take on Tuesday and Friday (Patient not taking: Reported on 09/23/2021)     No current facility-administered medications for this visit.   Facility-Administered Medications Ordered in Other Visits  Medication Dose Route Frequency Provider Last Rate Last Admin   0.9 %   sodium chloride infusion   Intravenous Continuous Nikeria Kalman, Kirt Boys, NP 999 mL/hr at 10/15/21 0930 New Bag at 10/15/21 0930   heparin lock flush 100 UNIT/ML injection             VITAL SIGNS: There were no vitals taken for this visit. There were no vitals filed for this visit.   Estimated body mass index is 30.36 kg/m as calculated from the following:   Height as of 10/14/21: 5' 2" (1.575 m).  Weight as of 10/14/21: 166 lb (75.3 kg).  LABS: CBC:    Component Value Date/Time   WBC 0.2 (LL) 10/15/2021 0908   HGB 8.5 (L) 10/15/2021 0908   HGB 13.3 07/03/2014 1503   HCT 26.4 (L) 10/15/2021 0908   HCT 40.8 07/03/2014 1503   PLT 94 (L) 10/15/2021 0908   PLT 240 07/03/2014 1503   MCV 77.6 (L) 10/15/2021 0908   MCV 80 07/03/2014 1503   NEUTROABS 0.0 (LL) 10/15/2021 0908   NEUTROABS 4.4 07/03/2014 1503   LYMPHSABS 0.1 (L) 10/15/2021 0908   LYMPHSABS 2.8 07/03/2014 1503   MONOABS 0.0 (L) 10/15/2021 0908   MONOABS 0.5 07/03/2014 1503   EOSABS 0.0 10/15/2021 0908   EOSABS 0.3 07/03/2014 1503   BASOSABS 0.0 10/15/2021 0908   BASOSABS 0.1 07/03/2014 1503   Comprehensive Metabolic Panel:    Component Value Date/Time   NA 134 (L) 10/15/2021 0908   NA 138 07/03/2014 1503   K 3.7 10/15/2021 0908   K 3.9 07/03/2014 1503   CL 103 10/15/2021 0908   CL 103 07/03/2014 1503   CO2 25 10/15/2021 0908   CO2 28 07/03/2014 1503   BUN 11 10/15/2021 0908   BUN 16 07/03/2014 1503   CREATININE 0.59 10/15/2021 0908   CREATININE 1.00 07/03/2014 1503   GLUCOSE 137 (H) 10/15/2021 0908   GLUCOSE 101 (H) 07/03/2014 1503   CALCIUM 8.6 (L) 10/15/2021 0908   CALCIUM 9.0 07/03/2014 1503   AST 27 10/15/2021 0908   AST 29 07/03/2014 1503   ALT 33 10/15/2021 0908   ALT 34 07/03/2014 1503   ALKPHOS 121 10/15/2021 0908   ALKPHOS 174 (H) 07/03/2014 1503   BILITOT 0.9 10/15/2021 0908   BILITOT 0.6 07/03/2014 1503   PROT 5.5 (L) 10/15/2021 0908   PROT 7.4 07/03/2014 1503   ALBUMIN 2.7 (L) 10/15/2021  0908   ALBUMIN 4.0 07/03/2014 1503    RADIOGRAPHIC STUDIES: US RENAL  Result Date: 09/25/2021 CLINICAL DATA:  Hydronephrosis EXAM: RENAL / URINARY TRACT ULTRASOUND COMPLETE COMPARISON:  08/19/2021 FINDINGS: Right Kidney: Renal measurements: 10.5 x 5.7 x 6.2 cm = volume: 196 mL. Echogenicity within normal limits. No mass or hydronephrosis visualized. Left Kidney: Renal measurements: 10.8 x 5.8 x 5.8 cm = volume: 190.7 mL. Echogenicity within normal limits. No hydronephrosis. Exophytic 1.9 cm cyst off the interpolar region. Bladder: Appears normal for degree of bladder distention. Other: None. IMPRESSION: 1. No evidence of hydronephrosis or nephrolithiasis. 2. Benign left renal cyst. Otherwise unremarkable appearance of the kidneys. Electronically Signed   By: Michael  Brown M.D.   On: 09/25/2021 21:56   US Abdomen Limited RUQ (LIVER/GB)  Result Date: 09/23/2021 CLINICAL DATA:  Elevated LFTs EXAM: ULTRASOUND ABDOMEN LIMITED RIGHT UPPER QUADRANT COMPARISON:  01/06/2021 FINDINGS: Gallbladder: Surgically removed Common bile duct: Diameter: 5.4 mm. Liver: No focal lesion identified. Within normal limits in parenchymal echogenicity. 3 mm calcification is noted in the right lobe of the liver stable from prior CT. Portal vein is patent on color Doppler imaging with normal direction of blood flow towards the liver. Other: None. IMPRESSION: Status post cholecystectomy. No acute abnormality to correspond with the given clinical history is noted. Electronically Signed   By: Mark  Lukens M.D.   On: 09/23/2021 23:30   DG Chest Port 1 View  Result Date: 09/23/2021 CLINICAL DATA:  A 75-year-old female presents with questionable sepsis. EXAM: PORTABLE CHEST 1 VIEW COMPARISON:  September 07, 2021. FINDINGS: EKG leads project over the chest. RIGHT-sided Port-A-Cath terminates at   the caval to atrial junction. Cardiomediastinal contours and hilar structures are stable. Lungs are clear. No visible pneumothorax. On limited  assessment there is no acute skeletal finding. IMPRESSION: 1. No active disease. 2. RIGHT-sided Port-A-Cath terminates at the caval to atrial junction. 3. Known adenopathy in the chest and lower neck better seen on prior PET imaging Electronically Signed   By: Geoffrey  Wile M.D.   On: 09/23/2021 19:49   NM Cardiac Muga Rest  Result Date: 09/15/2021 CLINICAL DATA:  Diffuse large B-cell lymphoma, cardiotoxic chemotherapy planned, history of coronary stenting, atrial fibrillation, COPD, prior chemotherapy for breast cancer in 2018 EXAM: NUCLEAR MEDICINE CARDIAC BLOOD POOL IMAGING (MUGA) TECHNIQUE: Cardiac multi-gated acquisition was performed at rest following intravenous injection of Tc-99m labeled red blood cells. RADIOPHARMACEUTICALS:  22.76 mCi Tc-99m pertechnetate in-vitro labeled red blood cells IV COMPARISON:  None Available. FINDINGS: Calculated LEFT ventricular ejection fraction is 75%, upper normal. Study was obtained at a cardiac rate of 67 bpm. Patient was rhythmic during imaging. Cine analysis of the LEFT ventricle in 3 projections demonstrates normal LV wall motion. IMPRESSION: Upper normal LEFT ventricular ejection fraction of 75% with normal LV wall motion. Electronically Signed   By: Mark  Boles M.D.   On: 09/15/2021 15:34    PERFORMANCE STATUS (ECOG) : 1 - Symptomatic but completely ambulatory  Review of Systems Unless otherwise noted, a complete review of systems is negative.  Physical Exam General: NAD Pulmonary: unlabored Extremities: no edema, no joint deformities Skin: no rashes, sacral ulcer Neurological: Weakness but otherwise nonfocal  IMPRESSION: Patient seen for follow-up while she was receiving IV fluids.  Patient accompanied by her brother.  Patient says that she is asymptomatic today.  She feels much improved.  She has taken 2 doses of Bactrim.  Labs reviewed with patient and family.  ANC is 0 and neutropenic precautions reviewed in detail.  Patient likely at  nadir.  Discussed with Dr. Finnegan who advised continuation of Bactrim.  ED precautions reviewed with patient and family in detail.  Would recommend ED immediately in the event of any clinical decline or development of fever.  Patient verbalized agreement with plan.  We will closely monitor in clinic and bring patient back on Monday for repeat check of labs/possible fluids  PLAN: -IV fluids today -Complete course of Bactrim -Urine culture and sensitivities pending -Push oral fluids -Neutropenic precautions -ED utilization for any clinical decline/fever -RTC on Monday for labs/fluids  Case and plan discussed with Dr. Finnegan   Patient expressed understanding and was in agreement with this plan. She also understands that She can call clinic at any time with any questions, concerns, or complaints.   Thank you for allowing me to participate in the care of this very pleasant patient.   Time Total: 15 minutes  Visit consisted of counseling and education dealing with the complex and emotionally intense issues of symptom management in the setting of serious illness.Greater than 50%  of this time was spent counseling and coordinating care related to the above assessment and plan.  Signed by: Joshua Borders, PhD, NP-C     

## 2021-10-16 ENCOUNTER — Telehealth: Payer: Self-pay

## 2021-10-16 ENCOUNTER — Telehealth: Payer: Self-pay | Admitting: Oncology

## 2021-10-16 LAB — URINE CULTURE: Culture: >=100000 — AB

## 2021-10-16 NOTE — Telephone Encounter (Signed)
Called pt to follow-up on symptoms. She reports that she is feeling better and remains afebrile. She was encouraged to utilize ED if any decline or development of fever. She verbalized understanding and thanked me for calling to check on her.

## 2021-10-16 NOTE — Telephone Encounter (Signed)
Patient called to report temperature of 100.3. She is neutropenic, s/p [facility] for lymphoma treatment.  Currently on antibiotics for UTI Recommend patient to go to ER for evaluation

## 2021-10-19 ENCOUNTER — Inpatient Hospital Stay (HOSPITAL_BASED_OUTPATIENT_CLINIC_OR_DEPARTMENT_OTHER): Payer: No Typology Code available for payment source | Admitting: Hospice and Palliative Medicine

## 2021-10-19 ENCOUNTER — Other Ambulatory Visit: Payer: Self-pay

## 2021-10-19 ENCOUNTER — Inpatient Hospital Stay: Payer: No Typology Code available for payment source

## 2021-10-19 ENCOUNTER — Other Ambulatory Visit (HOSPITAL_BASED_OUTPATIENT_CLINIC_OR_DEPARTMENT_OTHER): Payer: Self-pay

## 2021-10-19 VITALS — BP 91/62 | HR 78 | Temp 98.4°F | Resp 16 | Wt 168.0 lb

## 2021-10-19 DIAGNOSIS — Z95828 Presence of other vascular implants and grafts: Secondary | ICD-10-CM

## 2021-10-19 DIAGNOSIS — C8338 Diffuse large B-cell lymphoma, lymph nodes of multiple sites: Secondary | ICD-10-CM | POA: Diagnosis not present

## 2021-10-19 DIAGNOSIS — Z5111 Encounter for antineoplastic chemotherapy: Secondary | ICD-10-CM | POA: Diagnosis not present

## 2021-10-19 DIAGNOSIS — N390 Urinary tract infection, site not specified: Secondary | ICD-10-CM

## 2021-10-19 DIAGNOSIS — E860 Dehydration: Secondary | ICD-10-CM | POA: Diagnosis not present

## 2021-10-19 LAB — COMPREHENSIVE METABOLIC PANEL
ALT: 36 U/L (ref 0–44)
AST: 33 U/L (ref 15–41)
Albumin: 3 g/dL — ABNORMAL LOW (ref 3.5–5.0)
Alkaline Phosphatase: 161 U/L — ABNORMAL HIGH (ref 38–126)
Anion gap: 8 (ref 5–15)
BUN: 10 mg/dL (ref 8–23)
CO2: 23 mmol/L (ref 22–32)
Calcium: 8.8 mg/dL — ABNORMAL LOW (ref 8.9–10.3)
Chloride: 103 mmol/L (ref 98–111)
Creatinine, Ser: 0.88 mg/dL (ref 0.44–1.00)
GFR, Estimated: >60 mL/min (ref >60)
Glucose, Bld: 117 mg/dL — ABNORMAL HIGH (ref 70–99)
Potassium: 4.1 mmol/L (ref 3.5–5.1)
Sodium: 134 mmol/L — ABNORMAL LOW (ref 135–145)
Total Bilirubin: 0.5 mg/dL (ref 0.3–1.2)
Total Protein: 6.1 g/dL — ABNORMAL LOW (ref 6.5–8.1)

## 2021-10-19 LAB — CBC WITH DIFFERENTIAL/PLATELET
Abs Immature Granulocytes: 0 10*3/uL (ref 0.00–0.07)
Basophils Absolute: 0 10*3/uL (ref 0.0–0.1)
Basophils Relative: 3 %
Eosinophils Absolute: 0 10*3/uL (ref 0.0–0.5)
Eosinophils Relative: 3 %
HCT: 23.4 % — ABNORMAL LOW (ref 36.0–46.0)
Hemoglobin: 7.5 g/dL — ABNORMAL LOW (ref 12.0–15.0)
Immature Granulocytes: 0 %
Lymphocytes Relative: 33 %
Lymphs Abs: 0.3 10*3/uL — ABNORMAL LOW (ref 0.7–4.0)
MCH: 24.8 pg — ABNORMAL LOW (ref 26.0–34.0)
MCHC: 32.1 g/dL (ref 30.0–36.0)
MCV: 77.2 fL — ABNORMAL LOW (ref 80.0–100.0)
Monocytes Absolute: 0.1 10*3/uL (ref 0.1–1.0)
Monocytes Relative: 11 %
Neutro Abs: 0.4 10*3/uL — CL (ref 1.7–7.7)
Neutrophils Relative %: 50 %
Platelets: 47 10*3/uL — ABNORMAL LOW (ref 150–400)
RBC: 3.03 MIL/uL — ABNORMAL LOW (ref 3.87–5.11)
RDW: 19.9 % — ABNORMAL HIGH (ref 11.5–15.5)
Smear Review: NORMAL
WBC: 0.8 10*3/uL — CL (ref 4.0–10.5)
nRBC: 0 % (ref 0.0–0.2)

## 2021-10-19 MED ORDER — HEPARIN SOD (PORK) LOCK FLUSH 100 UNIT/ML IV SOLN
500.0000 [IU] | Freq: Once | INTRAVENOUS | Status: AC
  Administered 2021-10-19: 500 [IU] via INTRAVENOUS
  Filled 2021-10-19: qty 5

## 2021-10-19 MED ORDER — SODIUM CHLORIDE 0.9% FLUSH
10.0000 mL | Freq: Once | INTRAVENOUS | Status: AC
  Administered 2021-10-19: 10 mL via INTRAVENOUS
  Filled 2021-10-19: qty 10

## 2021-10-19 MED ORDER — SODIUM CHLORIDE 0.9 % IV SOLN
INTRAVENOUS | Status: AC
  Filled 2021-10-19: qty 250

## 2021-10-19 NOTE — Progress Notes (Signed)
Hg 7.5 reported to [employer]. Per Merrily Pew, we will bring pt back on 8/3 for repeat labs/IV fluids, and possible blood transfusion on 8/4.

## 2021-10-19 NOTE — Progress Notes (Signed)
Symptom Management and [place] at [facility] Telephone:([phone]) [phone] [phone]  Patient Care Team: Rusty Aus, MD as PCP - General (Internal Medicine) Isaias Cowman, MD as Consulting Physician (Cardiology) Lloyd Huger, MD as Consulting Physician (Oncology)   NAME OF PATIENT: Stacey Stone  [id number]  [birth year]   DATE OF VISIT: 10/19/21  REASON FOR CONSULT: Stacey Stone is a 76 y.o. 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  Patient was started on systemic chemotherapy.   Patient was hospitalized 09/23/2021-09/28/2021 with UTI and COVID.  Patient received IV antibiotics and remdesivir.  Patient was seen in [facility] on 10/14/2021 for UTI and started on Bactrim.  INTERVAL HISTORY: Patient seen in [facility] today for follow-up.  She reports having a low-grade fever on Friday and spoke with on-call physician, Dr. Tasia Catchings.  Patient was advised to present to the ER for further evaluation.  However, patient states that she did not want to go to the ER and so instead opted to stay home and monitor her temperature hourly.  She does not report any further episodes of fever.  She did not require any antipyretics.  Patient says that yesterday she felt "fantastic" but today has some fatigue.  She denies other symptomatic complaints.  She is trying to drink more fluids.  Denies any neurologic complaints. Denies any easy bleeding or bruising. Reports fair appetite and denies weight loss. Denies chest pain. Denies any nausea, vomiting, constipation, or diarrhea.  Patient offers no further specific complaints today.  SOCIAL HISTORY:     reports that she has never smoked. She has never used smokeless  tobacco. She reports that she does not drink alcohol and does not use drugs.  Patient lives at home alone.  She has a daughter and brother who are involved in her care.  ADVANCE DIRECTIVES:    CODE STATUS:    PAST MEDICAL HISTORY: Past Medical History:  Diagnosis Date   Abdominal pain, left lower quadrant    epiploic appendagitis   Anginal pain ([place])    Arthritis 06/06/2018   knee   Asthma    mild   Atrial fibrillation (HCC)    Breast cancer ([place])    Coronary artery disease 06/06/2018   1 artery 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 Mamie Nick. 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   No pertinent past medical history    Pancytopenia ([place])    Personal history of radiation therapy    Sleep apnea 06/06/2018   O2- 2l at bedtime and BiPap @ bedtime    PAST SURGICAL HISTORY:  Past Surgical History:  Procedure Laterality Date   BREAST BIOPSY Left 2/136/2018   INVASIVE MAMMARY CARCINOMA   BREAST BIOPSY Right 05/25/2016   FIBROCYSTIC CHANGE WITH CALCIFICATIONS    BREAST BIOPSY Right 05/29/2020   Affirm bx-"X" clip-FIBROADENOMA WITH ASSOCIATED CALCIFICATIONS. - NEGATIVE   BREAST EXCISIONAL BIOPSY Left 06/04/2016   INVASIVE MAMMARY CARCINOMA.    BREAST LUMPECTOMY Left 06/04/2016   INVASIVE MAMMARY CARCINOMA.  CARDIAC CATHETERIZATION     CARDIAC CATHETERIZATION N/A 09/24/2015   Procedure: Left Heart Cath and Coronary Angiography;  Surgeon: Isaias Cowman, MD;  Location: [place] [facility];  Service: Cardiovascular;  Laterality: N/A;   CARDIAC CATHETERIZATION N/A 09/24/2015   Procedure: Coronary Stent Intervention;  Surgeon: Isaias Cowman, MD;  Location: [place] [facility];  Service: Cardiovascular;  Laterality: N/A;    CHOLECYSTECTOMY  11/26   08/1970   COLONOSCOPY WITH PROPOFOL N/A 07/08/2021   Procedure: COLONOSCOPY WITH PROPOFOL;  Surgeon: Toledo, Benay Pike, MD;  Location: [facility];  Service: Gastroenterology;  Laterality: N/A;   DILATATION & CURETTAGE/HYSTEROSCOPY WITH MYOSURE N/A 05/05/2015   Procedure: Hysteroscopy and endometrial curretage;  Surgeon: Boykin Nearing, MD;  Location: [facility];  Service: Gynecology;  Laterality: N/A;   DILATION AND CURETTAGE OF UTERUS     EXCISION MASS FROM NECK  Right    Dr. Tami Ribas   EYE SURGERY  05/29/2020   bil cataract 9/08   HAMMER TOE SURGERY Right    IR BONE MARROW BIOPSY & ASPIRATION  09/09/2021   IR IMAGING GUIDED PORT INSERTION  09/09/2021   JOINT REPLACEMENT Bilateral 2008, 11/26/ 2012   Partial Knee Replacements   LEFT HEART CATH AND CORONARY ANGIOGRAPHY N/A 10/25/2017   Procedure: LEFT HEART CATH AND CORONARY ANGIOGRAPHY;  Surgeon: Teodoro Spray, MD;  Location: [place] [facility];  Service: Cardiovascular;  Laterality: N/A;   LEFT HEART CATH AND CORONARY ANGIOGRAPHY N/A 10/26/2017   Procedure: LEFT HEART CATH AND CORONARY ANGIOGRAPHY;  Surgeon: Isaias Cowman, MD;  Location: [place] [facility];  Service: Cardiovascular;  Laterality: N/A;   PARTIAL MASTECTOMY WITH NEEDLE LOCALIZATION Left 06/04/2016   Procedure: PARTIAL MASTECTOMY WITH NEEDLE LOCALIZATION;  Surgeon: Leonie Green, MD;  Location: [facility];  Service: General;  Laterality: Left;   SCLERAL BUCKLE  02/16/2011   Procedure: SCLERAL BUCKLE;  Surgeon: Hayden Pedro, MD;  Location: [place];  Service: Ophthalmology;  Laterality: Left;  Scleral Buckle Left Eye with Headscope Laser   SENTINEL NODE BIOPSY Left 06/04/2016   Procedure: SENTINEL NODE BIOPSY;  Surgeon: Leonie Green, MD;  Location: [facility];  Service: General;  Laterality: Left;    HEMATOLOGY/ONCOLOGY HISTORY:  Oncology History Overview Note  Pathologic stage Ia ER positive, 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 MammaPrint was reported as low risk, therefore she did not require adjuvant chemotherapy. She completed adjuvant XRT. Will complete 5 year of letrozole in July 2023.   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  Patient's most recent CT scan on April 28, 2018 revealed no evidence of progressive or recurrent disease.    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/2014 Initial Diagnosis   MALT (mucosa associated lymphoid tissue)   Primary cancer of upper outer quadrant of left female breast ([place])  05/09/2016 Initial Diagnosis   Primary cancer of upper outer quadrant of left female breast ([place])   DLBCL (diffuse large B cell lymphoma) ([place])  09/10/2021 Initial Diagnosis   DLBCL (diffuse large B cell lymphoma) ([place])   09/17/2021 -  Chemotherapy   Patient is on Treatment Plan : NON-HODGKINS LYMPHOMA R-CHOP q21d     09/29/2021 Cancer Staging   Staging form: Hodgkin and Non-Hodgkin Lymphoma, AJCC 8th Edition - Clinical stage from 09/29/2021: Stage III (Diffuse large B-cell lymphoma) -  Signed by Lloyd Huger, MD on 09/29/2021 Stage prefix: Initial diagnosis     ALLERGIES:  is allergic to codeine and tramadol.  MEDICATIONS:  Current Outpatient Medications  Medication Sig Dispense Refill   acetaminophen (TYLENOL) 500 MG tablet Take 500 mg by mouth every 4 (four) hours as needed.     allopurinol (ZYLOPRIM) 300 MG tablet Take 1 tablet (300 mg total) by mouth daily. 30 tablet 3   amLODipine (NORVASC) 10 MG tablet Take 1 tablet (10 mg total) by mouth daily. 30 tablet 0   apixaban (ELIQUIS) 5 MG TABS tablet Take 5 mg by mouth 2 (two) times daily.     atorvastatin (LIPITOR) 40 MG tablet Take 1 tablet (40 mg total) by mouth at bedtime.     diltiazem (CARDIZEM) 30 MG tablet Take 30 mg by mouth 2 (two) times  daily as needed (for breakthrough afib).  (Patient not taking: Reported on 07/08/2021)     famotidine (PEPCID) 40 MG tablet Take 40 mg by mouth at bedtime.     ferrous sulfate 325 (65 FE) MG tablet Take 325 mg by mouth once a week. (Patient not taking: Reported on 09/23/2021)     Fluticasone-Salmeterol (ADVAIR) 100-50 MCG/DOSE AEPB Inhale 1 puff into the lungs 2 (two) times daily as needed (for asthma.).     isosorbide mononitrate (IMDUR) 30 MG 24 hr tablet Take 30 mg by mouth daily.     letrozole (FEMARA) 2.5 MG tablet TAKE 1 TABLET BY MOUTH ONCE DAILY 90 tablet 3   levothyroxine (SYNTHROID) 125 MCG tablet Take 125 mcg by mouth daily.     lidocaine-prilocaine (EMLA) cream Apply to affected area once 30 g 3   magic mouthwash (nystatin, hydrocortisone, diphenhydrAMINE) suspension Swish and spit 5 mLs 3 (three) times daily as needed for mouth pain. 540 mL 0   nitroGLYCERIN (NITROSTAT) 0.4 MG SL tablet Place 0.4 mg under the tongue every 5 (five) minutes as needed for chest pain. Maximum 3 doses, If no relief call md or 911. (Patient not taking: Reported on 09/23/2021)     ondansetron (ZOFRAN) 8 MG tablet Take 1 tablet (8 mg total) by mouth 2 (two) times daily as needed for refractory nausea / vomiting. Start on day 3 after cyclophosphamide chemotherapy. 60 tablet 1   potassium chloride (KLOR-CON) 10 MEQ tablet Take 1 tablet (10 mEq total) by mouth 2 (two) times daily.     predniSONE (DELTASONE) 20 MG tablet Take 5 tablets (100 mg total) by mouth daily. Take with food on days 1-5 of chemotherapy. (Patient not taking: Reported on 09/23/2021) 25 tablet 5   prochlorperazine (COMPAZINE) 10 MG tablet Take 1 tablet (10 mg total) by mouth every 6 (six) hours as needed (Nausea or vomiting). 30 tablet 6   RABEprazole (ACIPHEX) 20 MG tablet Take 20 mg by mouth daily.     sertraline (ZOLOFT) 50 MG tablet Take 50 mg by mouth at bedtime.     sotalol (BETAPACE) 80 MG tablet Take 80 mg by mouth 2 (two) times daily.      sulfamethoxazole-trimethoprim (BACTRIM DS) 800-160 MG tablet Take 1 tablet by mouth 2 (two) times daily. 20 tablet 0   torsemide (DEMADEX) 20 MG tablet Take 10 mg by mouth as directed. Take on Tuesday and Friday (Patient not taking: Reported on 09/23/2021)     No current facility-administered medications for this visit.   Facility-Administered Medications Ordered in Other Visits  Medication Dose Route Frequency Provider Last Rate Last Admin   heparin  lock flush 100 UNIT/ML injection             VITAL SIGNS: There were no vitals taken for this visit. There were no vitals filed for this visit.   Estimated body mass index is 30.36 kg/m as calculated from the following:   Height as of 10/14/21: _0  (1.575 m).   Weight as of 10/14/21: 166 lb (75.3 kg).  LABS: CBC:    Component Value Date/Time   WBC 0.2 (LL) 10/15/2021 0908   HGB 8.5 (L) 10/15/2021 0908   HGB 13.3 07/03/2014 1503   HCT 26.4 (L) 10/15/2021 0908   HCT 40.8 07/03/2014 1503   PLT 94 (L) 10/15/2021 0908   PLT 240 07/03/2014 1503   MCV 77.6 (L) 10/15/2021 0908   MCV 80 07/03/2014 1503   NEUTROABS 0.0 (LL) 10/15/2021 0908   NEUTROABS 4.4 07/03/2014 1503   LYMPHSABS 0.1 (L) 10/15/2021 0908   LYMPHSABS 2.8 07/03/2014 1503   MONOABS 0.0 (L) 10/15/2021 0908   MONOABS 0.5 07/03/2014 1503   EOSABS 0.0 10/15/2021 0908   EOSABS 0.3 07/03/2014 1503   BASOSABS 0.0 10/15/2021 0908   BASOSABS 0.1 07/03/2014 1503   Comprehensive Metabolic Panel:    Component Value Date/Time   NA 134 (L) 10/15/2021 0908   NA 138 07/03/2014 1503   K 3.7 10/15/2021 0908   K 3.9 07/03/2014 1503   CL 103 10/15/2021 0908   CL 103 07/03/2014 1503   CO2 25 10/15/2021 0908   CO2 28 07/03/2014 1503   BUN 11 10/15/2021 0908   BUN 16 07/03/2014 1503   CREATININE 0.59 10/15/2021 0908   CREATININE 1.00 07/03/2014 1503   GLUCOSE 137 (H) 10/15/2021 0908   GLUCOSE 101 (H) 07/03/2014 1503   CALCIUM 8.6 (L) 10/15/2021 0908   CALCIUM 9.0 07/03/2014 1503    AST 27 10/15/2021 0908   AST 29 07/03/2014 1503   ALT 33 10/15/2021 0908   ALT 34 07/03/2014 1503   ALKPHOS 121 10/15/2021 0908   ALKPHOS 174 (H) 07/03/2014 1503   BILITOT 0.9 10/15/2021 0908   BILITOT 0.6 07/03/2014 1503   PROT 5.5 (L) 10/15/2021 0908   PROT 7.4 07/03/2014 1503   ALBUMIN 2.7 (L) 10/15/2021 0908   ALBUMIN 4.0 07/03/2014 1503    RADIOGRAPHIC STUDIES: US RENAL  Result Date: 09/25/2021 CLINICAL DATA:  Hydronephrosis EXAM: RENAL / URINARY TRACT ULTRASOUND COMPLETE COMPARISON:  08/19/2021 FINDINGS: Right Kidney: Renal measurements: 10.5 x 5.7 x 6.2 cm = volume: 196 mL. Echogenicity within normal limits. No mass or hydronephrosis visualized. Left Kidney: Renal measurements: 10.8 x 5.8 x 5.8 cm = volume: 190.7 mL. Echogenicity within normal limits. No hydronephrosis. Exophytic 1.9 cm cyst off the interpolar region. Bladder: Appears normal for degree of bladder distention. Other: None. IMPRESSION: 1. No evidence of hydronephrosis or nephrolithiasis. 2. Benign left renal cyst. Otherwise unremarkable appearance of the kidneys. Electronically Signed   By: Randa Ngo M.D.   On: 09/25/2021 21:56   US Abdomen Limited RUQ (LIVER/GB)  Result Date: 09/23/2021 CLINICAL DATA:  Elevated LFTs EXAM: ULTRASOUND ABDOMEN LIMITED RIGHT UPPER QUADRANT COMPARISON:  01/06/2021 FINDINGS: Gallbladder: Surgically removed Common bile duct: Diameter: 5.4 mm. Liver: No focal lesion identified. Within normal limits in parenchymal echogenicity. 3 mm calcification is noted in the right lobe of the liver stable from prior CT. Portal vein is patent on color Doppler imaging with normal direction of blood flow towards the liver. Other: None. IMPRESSION: Status post cholecystectomy. No acute abnormality to correspond with the  given clinical history is noted. Electronically Signed   By: Inez Catalina M.D.   On: 09/23/2021 23:30   DG Chest Port 1 View  Result Date: 09/23/2021 CLINICAL DATA:  A 77 year old female  presents with questionable sepsis. EXAM: PORTABLE CHEST 1 VIEW COMPARISON:  September 07, 2021. FINDINGS: EKG leads project over the chest. RIGHT-sided Port-A-Cath terminates at the caval to atrial junction. Cardiomediastinal contours and hilar structures are stable. Lungs are clear. No visible pneumothorax. On limited assessment there is no acute skeletal finding. IMPRESSION: 1. No active disease. 2. RIGHT-sided Port-A-Cath terminates at the caval to atrial junction. 3. Known adenopathy in the chest and lower neck better seen on prior PET imaging Electronically Signed   By: Zetta Bills M.D.   On: 09/23/2021 19:49    PERFORMANCE STATUS (ECOG) : 1 - Symptomatic but completely ambulatory  Review of Systems Unless otherwise noted, a complete review of systems is negative.  Physical Exam General: NAD Pulmonary: unlabored Extremities: no edema, no joint deformities Skin: no rashes, sacral ulcer Neurological: Weakness but otherwise nonfocal  IMPRESSION: Patient seen for follow-up while she was receiving IV fluids.  Patient accompanied by her daughter.  Labs show pancytopenia, most likely secondary to chemotherapy.  Neutropenia slightly improved from nadir.  Suspect that counts will continue improving but will monitor labs closely.  No signs of bleeding.  IV fluids and supportive care today.  We will plan to bring patient back later this week for recheck of labs and possible fluids  PLAN: -IV fluids today -Complete course of Bactrim (currently on day 6 of 10) -Push oral fluids -Neutropenic precautions -ED utilization for any clinical decline/fever -labs/possible fluids end of this week   Patient expressed understanding and was in agreement with this plan. She also understands that She can call clinic at any time with any questions, concerns, or complaints.   Thank you for allowing me to participate in the care of this very pleasant patient.   Time Total: 15 minutes  Visit consisted of  counseling and education dealing with the complex and emotionally intense issues of symptom management in the setting of serious illness.Greater than 50%  of this time was spent counseling and coordinating care related to the above assessment and plan.  Signed by: Altha Harm, PhD, NP-C

## 2021-10-20 ENCOUNTER — Telehealth: Payer: Self-pay | Admitting: *Deleted

## 2021-10-20 ENCOUNTER — Other Ambulatory Visit: Payer: Self-pay

## 2021-10-20 NOTE — Telephone Encounter (Signed)
Spoke with patient who states her temperature is within normal limits.  Pt states she is tired but otherwise feeling well.  Pt verbalized understanding of continuing to check her temperature and that she has an appointment in clinic on Thursday, October 22, 2021.

## 2021-10-22 ENCOUNTER — Other Ambulatory Visit: Payer: Self-pay | Admitting: Oncology

## 2021-10-22 ENCOUNTER — Inpatient Hospital Stay: Payer: No Typology Code available for payment source | Attending: Nurse Practitioner

## 2021-10-22 ENCOUNTER — Other Ambulatory Visit: Payer: Self-pay | Admitting: *Deleted

## 2021-10-22 ENCOUNTER — Inpatient Hospital Stay: Payer: No Typology Code available for payment source

## 2021-10-22 VITALS — BP 135/74 | HR 76 | Temp 97.6°F

## 2021-10-22 DIAGNOSIS — C8338 Diffuse large B-cell lymphoma, lymph nodes of multiple sites: Secondary | ICD-10-CM | POA: Insufficient documentation

## 2021-10-22 DIAGNOSIS — Z5189 Encounter for other specified aftercare: Secondary | ICD-10-CM | POA: Diagnosis not present

## 2021-10-22 DIAGNOSIS — R531 Weakness: Secondary | ICD-10-CM | POA: Insufficient documentation

## 2021-10-22 DIAGNOSIS — Z5112 Encounter for antineoplastic immunotherapy: Secondary | ICD-10-CM | POA: Insufficient documentation

## 2021-10-22 DIAGNOSIS — Z5111 Encounter for antineoplastic chemotherapy: Secondary | ICD-10-CM | POA: Diagnosis not present

## 2021-10-22 DIAGNOSIS — R5383 Other fatigue: Secondary | ICD-10-CM | POA: Diagnosis not present

## 2021-10-22 DIAGNOSIS — D649 Anemia, unspecified: Secondary | ICD-10-CM | POA: Insufficient documentation

## 2021-10-22 DIAGNOSIS — Z79899 Other long term (current) drug therapy: Secondary | ICD-10-CM | POA: Diagnosis not present

## 2021-10-22 DIAGNOSIS — E860 Dehydration: Secondary | ICD-10-CM

## 2021-10-22 LAB — COMPREHENSIVE METABOLIC PANEL
ALT: 26 U/L (ref 0–44)
AST: 29 U/L (ref 15–41)
Albumin: 3.1 g/dL — ABNORMAL LOW (ref 3.5–5.0)
Alkaline Phosphatase: 143 U/L — ABNORMAL HIGH (ref 38–126)
Anion gap: 8 (ref 5–15)
BUN: 7 mg/dL — ABNORMAL LOW (ref 8–23)
CO2: 22 mmol/L (ref 22–32)
Calcium: 9 mg/dL (ref 8.9–10.3)
Chloride: 104 mmol/L (ref 98–111)
Creatinine, Ser: 1.01 mg/dL — ABNORMAL HIGH (ref 0.44–1.00)
GFR, Estimated: 57 mL/min — ABNORMAL LOW (ref >60)
Glucose, Bld: 124 mg/dL — ABNORMAL HIGH (ref 70–99)
Potassium: 4.1 mmol/L (ref 3.5–5.1)
Sodium: 134 mmol/L — ABNORMAL LOW (ref 135–145)
Total Bilirubin: 0.3 mg/dL (ref 0.3–1.2)
Total Protein: 6.4 g/dL — ABNORMAL LOW (ref 6.5–8.1)

## 2021-10-22 LAB — CBC WITH DIFFERENTIAL/PLATELET
Abs Immature Granulocytes: 0.4 10*3/uL — ABNORMAL HIGH (ref 0.00–0.07)
Band Neutrophils: 2 %
Basophils Absolute: 0 10*3/uL (ref 0.0–0.1)
Basophils Relative: 1 %
Eosinophils Absolute: 0 10*3/uL (ref 0.0–0.5)
Eosinophils Relative: 1 %
HCT: 26.1 % — ABNORMAL LOW (ref 36.0–46.0)
Hemoglobin: 8.2 g/dL — ABNORMAL LOW (ref 12.0–15.0)
Lymphocytes Relative: 20 %
Lymphs Abs: 0.7 10*3/uL (ref 0.7–4.0)
MCH: 25.2 pg — ABNORMAL LOW (ref 26.0–34.0)
MCHC: 31.4 g/dL (ref 30.0–36.0)
MCV: 80.1 fL (ref 80.0–100.0)
Metamyelocytes Relative: 2 %
Monocytes Absolute: 0.4 10*3/uL (ref 0.1–1.0)
Monocytes Relative: 13 %
Myelocytes: 8 %
Neutro Abs: 1.8 10*3/uL (ref 1.7–7.7)
Neutrophils Relative %: 52 %
Platelets: 153 10*3/uL (ref 150–400)
Promyelocytes Relative: 1 %
RBC: 3.26 MIL/uL — ABNORMAL LOW (ref 3.87–5.11)
RDW: 22.8 % — ABNORMAL HIGH (ref 11.5–15.5)
Smear Review: NORMAL
WBC: 3.4 10*3/uL — ABNORMAL LOW (ref 4.0–10.5)
nRBC: 2.9 % — ABNORMAL HIGH (ref 0.0–0.2)

## 2021-10-22 LAB — IRON AND TIBC
Iron: 94 ug/dL (ref 28–170)
Saturation Ratios: 33 % — ABNORMAL HIGH (ref 10.4–31.8)
TIBC: 287 ug/dL (ref 250–450)
UIBC: 193 ug/dL

## 2021-10-22 LAB — ABO/RH: ABO/RH(D): O NEG

## 2021-10-22 LAB — PREPARE RBC (CROSSMATCH)

## 2021-10-22 LAB — FERRITIN: Ferritin: 481 ng/mL — ABNORMAL HIGH (ref 11–307)

## 2021-10-22 MED ORDER — SODIUM CHLORIDE 0.9% FLUSH
10.0000 mL | Freq: Once | INTRAVENOUS | Status: AC | PRN
  Administered 2021-10-22: 10 mL
  Filled 2021-10-22: qty 10

## 2021-10-22 MED ORDER — SODIUM CHLORIDE 0.9 % IV SOLN
Freq: Once | INTRAVENOUS | Status: AC
  Filled 2021-10-22: qty 250

## 2021-10-22 MED ORDER — HEPARIN SOD (PORK) LOCK FLUSH 100 UNIT/ML IV SOLN
500.0000 [IU] | Freq: Once | INTRAVENOUS | Status: AC | PRN
  Administered 2021-10-22: 500 [IU]
  Filled 2021-10-22: qty 5

## 2021-10-22 NOTE — Progress Notes (Signed)
[facility]  [phone]  Fax:([phone]) [place] DOB: Jun 16, 1945  MR#: [id number]  EHU#:[id number]  Patient Care Team: Rusty Aus, MD as PCP - General (Internal Medicine) Isaias Cowman, MD as Consulting Physician (Cardiology) Lloyd Huger, MD as Consulting Physician (Oncology)   CHIEF COMPLAINT: Stage III diffuse large B-cell lymphoma.  INTERVAL HISTORY: Patient returns to clinic today for further evaluation and consideration of cycle 3 of R-CHOP.  She has been receiving fluids 2-3 times a week since her last treatment and feels significantly improved.  She does not have symptoms of UTI today.  She has chronic weakness and fatigue.  She does not complain of any further fevers.  She denies any night sweats or weight loss.  She has no neurologic complaints.  She denies any chest pain, shortness of breath, or hemoptysis.  She denies any nausea, vomiting, constipation, or diarrhea. She has no urinary complaints.  Patient offers no further specific complaints today.    REVIEW OF SYSTEMS:   Review of Systems  Constitutional:  Positive for malaise/fatigue. Negative for chills, diaphoresis, fever and weight loss.  Respiratory: Negative.  Negative for cough and shortness of breath.   Cardiovascular: Negative.  Negative for chest pain and leg swelling.  Gastrointestinal: Negative.  Negative for abdominal pain.  Genitourinary: Negative.  Negative for dysuria.  Musculoskeletal: Negative.  Negative for back pain.  Skin: Negative.  Negative for rash.  Neurological:  Positive for weakness. Negative for sensory change, focal weakness and headaches.  Psychiatric/Behavioral: Negative.  The patient is not nervous/anxious.     As per HPI. Otherwise, a complete review of systems is negative.  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 MammaPrint was reported as low risk, therefore she did not require adjuvant chemotherapy. She completed adjuvant XRT. Will complete 5 year of letrozole in July 2023.   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  Patient's most recent CT scan on April 28, 2018 revealed no evidence of progressive or recurrent disease.    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[place])  07/05/2014 Initial Diagnosis   MALT (mucosa associated lymphoid tissue)   Primary cancer of upper outer quadrant of left female breast ([place])  05/09/2016 Initial Diagnosis   Primary cancer of upper outer quadrant of left female breast ([place])   DLBCL (diffuse large B cell lymphoma) ([place])  09/10/2021 Initial Diagnosis   DLBCL (diffuse large B cell lymphoma) ([place])   09/17/2021 -  Chemotherapy   Patient is on Treatment Plan : NON-HODGKINS LYMPHOMA R-CHOP q21d     09/29/2021 Cancer Staging   Staging form: Hodgkin and Non-Hodgkin Lymphoma, AJCC 8th Edition - Clinical stage from 09/29/2021: Stage III (Diffuse large B-cell lymphoma) - Signed by Lloyd Huger, MD on 09/29/2021 Stage prefix: Initial diagnosis     PAST MEDICAL HISTORY: Past Medical History:  Diagnosis Date   Abdominal pain, left lower quadrant    epiploic appendagitis   Anginal pain ([place])    Arthritis 06/06/2018   knee   Asthma    mild   Atrial fibrillation (HCC)    Breast cancer (HCC)    Coronary artery disease 06/06/2018   1 artery 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 Mamie Nick. at [facility] in [place]   Depression  Diabetes mellitus without complication (HCC)    Dyspnea    Dysrhythmia    GERD (gastroesophageal reflux disease)    Hypertension    Hypothyroidism    MALT (mucosa associated lymphoid tissue) ([place]) 07/05/2014   Right neck mass resected 01/2014.   No pertinent past medical history    Pancytopenia  ([place])    Personal history of radiation therapy    Sleep apnea 06/06/2018   O2- 2l at bedtime and BiPap @ bedtime    PAST SURGICAL HISTORY: Past Surgical History:  Procedure Laterality Date   BREAST BIOPSY Left 2/136/2018   INVASIVE MAMMARY CARCINOMA   BREAST BIOPSY Right 05/25/2016   FIBROCYSTIC CHANGE WITH CALCIFICATIONS    BREAST BIOPSY Right 05/29/2020   Affirm bx-"X" clip-FIBROADENOMA WITH ASSOCIATED CALCIFICATIONS. - NEGATIVE   BREAST EXCISIONAL BIOPSY Left 06/04/2016   INVASIVE MAMMARY CARCINOMA.    BREAST LUMPECTOMY Left 06/04/2016   INVASIVE MAMMARY CARCINOMA.    CARDIAC CATHETERIZATION     CARDIAC CATHETERIZATION N/A 09/24/2015   Procedure: Left Heart Cath and Coronary Angiography;  Surgeon: Isaias Cowman, MD;  Location: [facility];  Service: Cardiovascular;  Laterality: N/A;   CARDIAC CATHETERIZATION N/A 09/24/2015   Procedure: Coronary Stent Intervention;  Surgeon: Isaias Cowman, MD;  Location: [facility];  Service: Cardiovascular;  Laterality: N/A;   CHOLECYSTECTOMY  11/26   08/1970   COLONOSCOPY WITH PROPOFOL N/A 07/08/2021   Procedure: COLONOSCOPY WITH PROPOFOL;  Surgeon: Toledo, Benay Pike, MD;  Location: [facility];  Service: Gastroenterology;  Laterality: N/A;   DILATATION & CURETTAGE/HYSTEROSCOPY WITH MYOSURE N/A 05/05/2015   Procedure: Hysteroscopy and endometrial curretage;  Surgeon: Boykin Nearing, MD;  Location: [facility];  Service: Gynecology;  Laterality: N/A;   DILATION AND CURETTAGE OF UTERUS     EXCISION MASS FROM NECK  Right    Dr. Tami Ribas   EYE SURGERY  05/29/2020   bil cataract 9/08   HAMMER TOE SURGERY Right    IR BONE MARROW BIOPSY & ASPIRATION  09/09/2021   IR IMAGING GUIDED PORT INSERTION  09/09/2021   JOINT REPLACEMENT Bilateral 2008, 11/26/ 2012   Partial Knee Replacements   LEFT HEART CATH AND CORONARY ANGIOGRAPHY N/A 10/25/2017   Procedure: LEFT HEART CATH AND CORONARY ANGIOGRAPHY;  Surgeon: Teodoro Spray, MD;  Location: [facility];  Service: Cardiovascular;  Laterality: N/A;   LEFT HEART CATH AND CORONARY ANGIOGRAPHY N/A 10/26/2017   Procedure: LEFT HEART CATH AND CORONARY ANGIOGRAPHY;  Surgeon: Isaias Cowman, MD;  Location: [facility];  Service: Cardiovascular;  Laterality: N/A;   PARTIAL MASTECTOMY WITH NEEDLE LOCALIZATION Left 06/04/2016   Procedure: PARTIAL MASTECTOMY WITH NEEDLE LOCALIZATION;  Surgeon: Leonie Green, MD;  Location: [facility];  Service: General;  Laterality: Left;   SCLERAL BUCKLE  02/16/2011   Procedure: SCLERAL BUCKLE;  Surgeon: Hayden Pedro, MD;  Location: [facility];  Service: Ophthalmology;  Laterality: Left;  Scleral Buckle Left Eye with Headscope Laser   SENTINEL NODE BIOPSY Left 06/04/2016   Procedure: SENTINEL NODE BIOPSY;  Surgeon: Leonie Green, MD;  Location: [facility];  Service: General;  Laterality: Left;    FAMILY HISTORY Family History  Problem Relation Age of Onset   Breast cancer Mother 84   Heart disease Father    Breast cancer Paternal Aunt 6   Breast cancer Cousin    Anesthesia problems Neg Hx    Hypotension Neg Hx    Malignant hyperthermia Neg Hx    Pseudochol  deficiency Neg Hx     GYNECOLOGIC HISTORY:  No LMP recorded. Patient is postmenopausal.     ADVANCED DIRECTIVES:    HEALTH MAINTENANCE: Social History   Tobacco Use   Smoking status: Never   Smokeless tobacco: Never  Vaping Use   Vaping Use: Never used  Substance Use Topics   Alcohol use: No   Drug use: No     Colonoscopy:  PAP:  Bone density:  Mammogram: November 2016  Allergies  Allergen Reactions   Codeine Other (See Comments)    Stroke like symptoms   Tramadol Itching    Current Outpatient Medications  Medication Sig Dispense Refill   acetaminophen (TYLENOL) 500 MG tablet Take 500 mg by mouth every 4 (four) hours as needed.     allopurinol (ZYLOPRIM) 300 MG tablet Take 1 tablet (300 mg total) by mouth daily. 30  tablet 3   amLODipine (NORVASC) 10 MG tablet Take 1 tablet (10 mg total) by mouth daily. 30 tablet 0   apixaban (ELIQUIS) 5 MG TABS tablet Take 5 mg by mouth 2 (two) times daily.     atorvastatin (LIPITOR) 40 MG tablet Take 1 tablet (40 mg total) by mouth at bedtime.     famotidine (PEPCID) 40 MG tablet Take 40 mg by mouth at bedtime.     Fluticasone-Salmeterol (ADVAIR) 100-50 MCG/DOSE AEPB Inhale 1 puff into the lungs 2 (two) times daily as needed (for asthma.).     isosorbide mononitrate (IMDUR) 30 MG 24 hr tablet Take 30 mg by mouth daily.     letrozole (FEMARA) 2.5 MG tablet TAKE 1 TABLET BY MOUTH ONCE DAILY 90 tablet 3   levothyroxine (SYNTHROID) 125 MCG tablet Take 125 mcg by mouth daily.     lidocaine-prilocaine (EMLA) cream Apply to affected area once 30 g 3   magic mouthwash (nystatin, hydrocortisone, diphenhydrAMINE) suspension Swish and spit 5 mLs 3 (three) times daily as needed for mouth pain. 540 mL 0   ondansetron (ZOFRAN) 8 MG tablet Take 1 tablet (8 mg total) by mouth 2 (two) times daily as needed for refractory nausea / vomiting. Start on day 3 after cyclophosphamide chemotherapy. 60 tablet 1   potassium chloride (KLOR-CON) 10 MEQ tablet Take 1 tablet (10 mEq total) by mouth 2 (two) times daily.     predniSONE (DELTASONE) 20 MG tablet Take 5 tablets (100 mg total) by mouth daily. Take with food on days 1-5 of chemotherapy. 25 tablet 5   prochlorperazine (COMPAZINE) 10 MG tablet Take 1 tablet (10 mg total) by mouth every 6 (six) hours as needed (Nausea or vomiting). 30 tablet 6   RABEprazole (ACIPHEX) 20 MG tablet Take 20 mg by mouth daily.     sertraline (ZOLOFT) 50 MG tablet Take 50 mg by mouth at bedtime.     sotalol (BETAPACE) 80 MG tablet Take 80 mg by mouth 2 (two) times daily.     diltiazem (CARDIZEM) 30 MG tablet Take 30 mg by mouth 2 (two) times daily as needed (for breakthrough afib).  (Patient not taking: Reported on 07/08/2021)     ferrous sulfate 325 (65 FE) MG tablet  Take 325 mg by mouth once a week. (Patient not taking: Reported on 09/23/2021)     nitroGLYCERIN (NITROSTAT) 0.4 MG SL tablet Place 0.4 mg under the tongue every 5 (five) minutes as needed for chest pain. Maximum 3 doses, If no relief call md or 911. (Patient not taking: Reported on 09/23/2021)     sulfamethoxazole-trimethoprim (BACTRIM DS) 800-160  MG tablet Take 1 tablet by mouth 2 (two) times daily. 20 tablet 0   torsemide (DEMADEX) 20 MG tablet Take 10 mg by mouth as directed. Take on Tuesday and Friday (Patient not taking: Reported on 09/23/2021)     No current facility-administered medications for this visit.   Facility-Administered Medications Ordered in Other Visits  Medication Dose Route Frequency Provider Last Rate Last Admin   acetaminophen (TYLENOL) 325 MG tablet            cyclophosphamide (CYTOXAN) 1,420 mg in sodium chloride 0.9 % 250 mL chemo infusion  750 mg/m2 (Treatment Plan Recorded) Intravenous Once Lloyd Huger, MD       dexamethasone (DECADRON) 10 mg in sodium chloride 0.9 % 50 mL IVPB  10 mg Intravenous Once Lloyd Huger, MD       diphenhydrAMINE (BENADRYL) 25 mg capsule            DOXOrubicin (ADRIAMYCIN) chemo injection 94 mg  50 mg/m2 (Treatment Plan Recorded) Intravenous Once Lloyd Huger, MD       fosaprepitant (EMEND) 150 mg in sodium chloride 0.9 % 145 mL IVPB  150 mg Intravenous Once Lloyd Huger, MD 450 mL/hr at 10/29/21 0949 150 mg at 10/29/21 0949   heparin lock flush 100 UNIT/ML injection            heparin lock flush 100 UNIT/ML injection            heparin lock flush 100 unit/mL  500 Units Intracatheter Once PRN Lloyd Huger, MD       palonosetron (ALOXI) 0.25 MG/5ML injection            riTUXimab-abbs (TRUXIMA) 700 mg in sodium chloride 0.9 % 180 mL infusion  375 mg/m2 (Treatment Plan Recorded) Intravenous Once Lloyd Huger, MD       vinCRIStine (ONCOVIN) 2 mg in sodium chloride 0.9 % 50 mL chemo infusion  2 mg Intravenous  Once Lloyd Huger, MD        OBJECTIVE: BP 112/71   Pulse 94   Temp 98.7 F (37.1 C)   Resp 20   Wt 161 lb 14.4 oz (73.4 kg)   SpO2 99%   BMI 29.61 kg/m    Body mass index is 29.61 kg/m.    ECOG FS:1 - Symptomatic but completely ambulatory  General: Well-developed, well-nourished, no acute distress. Eyes: Pink conjunctiva, anicteric sclera. HEENT: Normocephalic, moist mucous membranes. Lungs: No audible wheezing or coughing. Heart: Regular rate and rhythm. Abdomen: Soft, nontender, no obvious distention. Musculoskeletal: No edema, cyanosis, or clubbing. Neuro: Alert, answering all questions appropriately. Cranial nerves grossly intact. Skin: No rashes or petechiae noted. Psych: Normal affect.  LAB RESULTS:   STUDIES: No results found.  ASSESSMENT: Stage III diffuse large B-cell lymphoma.  PLAN:    Stage III diffuse large B-cell lymphoma: PET scan and biopsy results reviewed independently.  Case discussed with pathology confirming this is not a recurrence of patient's MALT lymphoma.  Bone marrow biopsy did not reveal any evidence of lymphoma.  MUGA scan completed on September 15, 2021 revealed an EF of 75%.  Repeat in September 2023 if necessary.  Patient will benefit from 6 cycles of R-CHOP every 21 days with Udenyca support.  Will reimage with PET scan after cycle 4.  Proceed with cycle 3 of treatment today.  Return to clinic tomorrow for [facility].  Patient will then return to clinic 2 times weekly for supportive IV fluids and then in 3  weeks for further evaluation and consideration of cycle 4.   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 MammaPrint was reported as low risk, therefore she did not require adjuvant chemotherapy. She completed adjuvant XRT.  Patient completed 5 years of letrozole in June 2023.  Her  most recent mammogram on May 20, 2021 was reported as BI-RADS 1.  Repeat in March 2024.   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  Case discussed with pathology.  This is a different primary.   Left renal lesion: CT scan results from December 15, 2020 with only mild increase of benign renal lesion to 13 mm.  No intervention is needed at this time. Bone mineral density: Patient's most recent bone mineral density on November 13, 2020 revealed a T score of -2.0 which is slightly worse than 1 year prior where her T score was reported -1.7.  Continue calcium and vitamin D supplementation.  Consider repeat in August 2023.   Anemia: Hemoglobin improved to 11.1.  Monitor. Neutropenia: Resolved.  Udenyca as above. Thrombocytopenia: Resolved.   COVID: Resolved. Recurrent UTI: Patient was given a prescription for Bactrim today and instructed only to initiate treatment if she is symptomatic.       Patient expressed understanding and was in agreement with this plan. She also understands that She can call clinic at any time with any questions, concerns, or complaints.     Lloyd Huger, MD   10/29/2021 9:51 AM

## 2021-10-22 NOTE — Progress Notes (Unsigned)
Very fatigued. Dyspnea with walking. Pale. Eating small amounts. Denies N/V . Received 1 L NS. Spoke with Dr Grayland Ormond as pt scheduled to receive a blood transfusion tomm, he would like pt to receive the transfusion , hgb 8.2 symptomatic and will need chemotherapy next week. Pt aware to return for appt at 9 am tomm. Port needle left accessed.

## 2021-10-23 ENCOUNTER — Other Ambulatory Visit: Payer: 59

## 2021-10-23 ENCOUNTER — Other Ambulatory Visit: Payer: Self-pay | Admitting: Oncology

## 2021-10-23 ENCOUNTER — Ambulatory Visit: Payer: 59

## 2021-10-23 ENCOUNTER — Inpatient Hospital Stay: Payer: No Typology Code available for payment source

## 2021-10-23 DIAGNOSIS — Z5111 Encounter for antineoplastic chemotherapy: Secondary | ICD-10-CM | POA: Diagnosis not present

## 2021-10-23 DIAGNOSIS — C8338 Diffuse large B-cell lymphoma, lymph nodes of multiple sites: Secondary | ICD-10-CM

## 2021-10-23 LAB — SAMPLE TO BLOOD BANK

## 2021-10-23 MED ORDER — SODIUM CHLORIDE 0.9% IV SOLUTION
250.0000 mL | Freq: Once | INTRAVENOUS | Status: AC
  Administered 2021-10-23: 250 mL via INTRAVENOUS
  Filled 2021-10-23: qty 250

## 2021-10-23 MED ORDER — DIPHENHYDRAMINE HCL 50 MG/ML IJ SOLN
25.0000 mg | Freq: Once | INTRAMUSCULAR | Status: AC
  Administered 2021-10-23: 25 mg via INTRAVENOUS
  Filled 2021-10-23: qty 1

## 2021-10-23 MED ORDER — HEPARIN SOD (PORK) LOCK FLUSH 100 UNIT/ML IV SOLN
INTRAVENOUS | Status: AC
  Administered 2021-10-23: 500 [IU]
  Filled 2021-10-23: qty 5

## 2021-10-23 MED ORDER — ACETAMINOPHEN 325 MG PO TABS
650.0000 mg | ORAL_TABLET | Freq: Once | ORAL | Status: AC
  Administered 2021-10-23: 650 mg via ORAL
  Filled 2021-10-23: qty 2

## 2021-10-24 LAB — BPAM RBC
Blood Product Expiration Date: 202308172359
ISSUE DATE / TIME: 202308040917
Unit Type and Rh: 9500

## 2021-10-24 LAB — TYPE AND SCREEN
ABO/RH(D): O NEG
Antibody Screen: NEGATIVE
Unit division: 0

## 2021-10-26 ENCOUNTER — Encounter (INDEPENDENT_AMBULATORY_CARE_PROVIDER_SITE_OTHER): Payer: 59 | Admitting: Ophthalmology

## 2021-10-26 DIAGNOSIS — H35033 Hypertensive retinopathy, bilateral: Secondary | ICD-10-CM

## 2021-10-26 DIAGNOSIS — H348312 Tributary (branch) retinal vein occlusion, right eye, stable: Secondary | ICD-10-CM | POA: Diagnosis not present

## 2021-10-26 DIAGNOSIS — H338 Other retinal detachments: Secondary | ICD-10-CM

## 2021-10-26 DIAGNOSIS — H34812 Central retinal vein occlusion, left eye, with macular edema: Secondary | ICD-10-CM

## 2021-10-26 DIAGNOSIS — I10 Essential (primary) hypertension: Secondary | ICD-10-CM | POA: Diagnosis not present

## 2021-10-26 DIAGNOSIS — H43813 Vitreous degeneration, bilateral: Secondary | ICD-10-CM | POA: Diagnosis not present

## 2021-10-27 ENCOUNTER — Other Ambulatory Visit (HOSPITAL_BASED_OUTPATIENT_CLINIC_OR_DEPARTMENT_OTHER): Payer: Self-pay | Admitting: Osteopathic Medicine

## 2021-10-28 MED FILL — Fosaprepitant Dimeglumine For IV Infusion 150 MG (Base Eq): INTRAVENOUS | Qty: 5 | Status: AC

## 2021-10-29 ENCOUNTER — Inpatient Hospital Stay: Payer: No Typology Code available for payment source

## 2021-10-29 ENCOUNTER — Encounter: Payer: Self-pay | Admitting: Oncology

## 2021-10-29 ENCOUNTER — Inpatient Hospital Stay (HOSPITAL_BASED_OUTPATIENT_CLINIC_OR_DEPARTMENT_OTHER): Payer: No Typology Code available for payment source | Admitting: Oncology

## 2021-10-29 VITALS — BP 112/71 | HR 94 | Temp 98.7°F | Resp 20 | Wt 161.9 lb

## 2021-10-29 DIAGNOSIS — C8338 Diffuse large B-cell lymphoma, lymph nodes of multiple sites: Secondary | ICD-10-CM

## 2021-10-29 DIAGNOSIS — Z5111 Encounter for antineoplastic chemotherapy: Secondary | ICD-10-CM | POA: Diagnosis not present

## 2021-10-29 LAB — COMPREHENSIVE METABOLIC PANEL
ALT: 16 U/L (ref 0–44)
AST: 29 U/L (ref 15–41)
Albumin: 3.4 g/dL — ABNORMAL LOW (ref 3.5–5.0)
Alkaline Phosphatase: 124 U/L (ref 38–126)
Anion gap: 8 (ref 5–15)
BUN: 12 mg/dL (ref 8–23)
CO2: 24 mmol/L (ref 22–32)
Calcium: 9.4 mg/dL (ref 8.9–10.3)
Chloride: 107 mmol/L (ref 98–111)
Creatinine, Ser: 0.86 mg/dL (ref 0.44–1.00)
GFR, Estimated: >60 mL/min (ref >60)
Glucose, Bld: 155 mg/dL — ABNORMAL HIGH (ref 70–99)
Potassium: 4.2 mmol/L (ref 3.5–5.1)
Sodium: 139 mmol/L (ref 135–145)
Total Bilirubin: 0.5 mg/dL (ref 0.3–1.2)
Total Protein: 6.7 g/dL (ref 6.5–8.1)

## 2021-10-29 LAB — CBC WITH DIFFERENTIAL/PLATELET
Abs Immature Granulocytes: 0.47 10*3/uL — ABNORMAL HIGH (ref 0.00–0.07)
Basophils Absolute: 0.2 10*3/uL — ABNORMAL HIGH (ref 0.0–0.1)
Basophils Relative: 2 %
Eosinophils Absolute: 0 10*3/uL (ref 0.0–0.5)
Eosinophils Relative: 0 %
HCT: 34.9 % — ABNORMAL LOW (ref 36.0–46.0)
Hemoglobin: 11.1 g/dL — ABNORMAL LOW (ref 12.0–15.0)
Immature Granulocytes: 6 %
Lymphocytes Relative: 12 %
Lymphs Abs: 1 10*3/uL (ref 0.7–4.0)
MCH: 26.8 pg (ref 26.0–34.0)
MCHC: 31.8 g/dL (ref 30.0–36.0)
MCV: 84.3 fL (ref 80.0–100.0)
Monocytes Absolute: 0.7 10*3/uL (ref 0.1–1.0)
Monocytes Relative: 8 %
Neutro Abs: 6.1 10*3/uL (ref 1.7–7.7)
Neutrophils Relative %: 72 %
Platelets: 356 10*3/uL (ref 150–400)
RBC: 4.14 MIL/uL (ref 3.87–5.11)
RDW: 25.7 % — ABNORMAL HIGH (ref 11.5–15.5)
WBC: 8.5 10*3/uL (ref 4.0–10.5)
nRBC: 0.8 % — ABNORMAL HIGH (ref 0.0–0.2)

## 2021-10-29 MED ORDER — DIPHENHYDRAMINE HCL 25 MG PO CAPS
ORAL_CAPSULE | ORAL | Status: AC
  Filled 2021-10-29: qty 1

## 2021-10-29 MED ORDER — ACETAMINOPHEN 325 MG PO TABS
ORAL_TABLET | ORAL | Status: AC
  Filled 2021-10-29: qty 2

## 2021-10-29 MED ORDER — PALONOSETRON HCL INJECTION 0.25 MG/5ML
0.2500 mg | Freq: Once | INTRAVENOUS | Status: AC
  Administered 2021-10-29: 0.25 mg via INTRAVENOUS

## 2021-10-29 MED ORDER — HEPARIN SOD (PORK) LOCK FLUSH 100 UNIT/ML IV SOLN
INTRAVENOUS | Status: AC
  Filled 2021-10-29: qty 5

## 2021-10-29 MED ORDER — SODIUM CHLORIDE 0.9 % IV SOLN: 375.0000 mg/m2 | Freq: Once | INTRAVENOUS | Status: DC

## 2021-10-29 MED ORDER — SODIUM CHLORIDE 0.9 % IV SOLN
Freq: Once | INTRAVENOUS | Status: AC
  Filled 2021-10-29: qty 250

## 2021-10-29 MED ORDER — DOXORUBICIN HCL CHEMO IV INJECTION 2 MG/ML
50.0000 mg/m2 | Freq: Once | INTRAVENOUS | Status: AC
  Administered 2021-10-29: 94 mg via INTRAVENOUS
  Filled 2021-10-29: qty 47

## 2021-10-29 MED ORDER — SODIUM CHLORIDE 0.9 % IV SOLN
750.0000 mg/m2 | Freq: Once | INTRAVENOUS | Status: AC
  Administered 2021-10-29: 1420 mg via INTRAVENOUS
  Filled 2021-10-29: qty 71

## 2021-10-29 MED ORDER — SODIUM CHLORIDE 0.9 % IV SOLN
150.0000 mg | Freq: Once | INTRAVENOUS | Status: AC
  Administered 2021-10-29: 150 mg via INTRAVENOUS
  Filled 2021-10-29: qty 150

## 2021-10-29 MED ORDER — HEPARIN SOD (PORK) LOCK FLUSH 100 UNIT/ML IV SOLN
500.0000 [IU] | Freq: Once | INTRAVENOUS | Status: AC | PRN
  Administered 2021-10-29: 500 [IU]
  Filled 2021-10-29: qty 5

## 2021-10-29 MED ORDER — SULFAMETHOXAZOLE-TRIMETHOPRIM 800-160 MG PO TABS
1.0000 | ORAL_TABLET | Freq: Two times a day (BID) | ORAL | 0 refills | Status: DC
Start: 2021-10-29 — End: 2021-11-10

## 2021-10-29 MED ORDER — VINCRISTINE SULFATE CHEMO INJECTION 1 MG/ML
2.0000 mg | Freq: Once | INTRAVENOUS | Status: AC
  Administered 2021-10-29: 2 mg via INTRAVENOUS
  Filled 2021-10-29: qty 2

## 2021-10-29 MED ORDER — PALONOSETRON HCL INJECTION 0.25 MG/5ML
INTRAVENOUS | Status: AC
  Filled 2021-10-29: qty 5

## 2021-10-29 MED ORDER — SODIUM CHLORIDE 0.9 % IV SOLN
10.0000 mg | Freq: Once | INTRAVENOUS | Status: AC
  Administered 2021-10-29: 10 mg via INTRAVENOUS
  Filled 2021-10-29: qty 1

## 2021-10-29 MED ORDER — DIPHENHYDRAMINE HCL 25 MG PO CAPS
25.0000 mg | ORAL_CAPSULE | Freq: Once | ORAL | Status: AC
  Administered 2021-10-29: 25 mg via ORAL

## 2021-10-29 MED ORDER — SODIUM CHLORIDE 0.9 % IV SOLN
375.0000 mg/m2 | Freq: Once | INTRAVENOUS | Status: AC
  Administered 2021-10-29: 700 mg via INTRAVENOUS
  Filled 2021-10-29: qty 50

## 2021-10-29 MED ORDER — ACETAMINOPHEN 325 MG PO TABS
650.0000 mg | ORAL_TABLET | Freq: Once | ORAL | Status: AC
  Administered 2021-10-29: 650 mg via ORAL

## 2021-10-29 NOTE — Patient Instructions (Signed)
[facility] AT [place]  Discharge Instructions: Thank you for choosing [facility] to provide your oncology and hematology care.  If you have a lab appointment with the [facility], please go directly to the [facility] and check in at the registration area.  Wear comfortable clothing and clothing appropriate for easy access to any Portacath or PICC line.   We strive to give you quality time with your provider. You may need to reschedule your appointment if you arrive late (15 or more minutes).  Arriving late affects you and other patients whose appointments are after yours.  Also, if you miss three or more appointments without notifying the office, you may be dismissed from the clinic at the provider's discretion.      For prescription refill requests, have your pharmacy contact our office and allow 72 hours for refills to be completed.    Today you received the following chemotherapy and/or immunotherapy agents Adriamycin, Oncovin, Cytoxan & Truxima      To help prevent nausea and vomiting after your treatment, we encourage you to take your nausea medication as directed.  BELOW ARE SYMPTOMS THAT SHOULD BE REPORTED IMMEDIATELY: *FEVER GREATER THAN 100.4 F (38 C) OR HIGHER *CHILLS OR SWEATING *NAUSEA AND VOMITING THAT IS NOT CONTROLLED WITH YOUR NAUSEA MEDICATION *UNUSUAL SHORTNESS OF BREATH *UNUSUAL BRUISING OR BLEEDING *URINARY PROBLEMS (pain or burning when urinating, or frequent urination) *BOWEL PROBLEMS (unusual diarrhea, constipation, pain near the anus) TENDERNESS IN MOUTH AND THROAT WITH OR WITHOUT PRESENCE OF ULCERS (sore throat, sores in mouth, or a toothache) UNUSUAL RASH, SWELLING OR PAIN  UNUSUAL VAGINAL DISCHARGE OR ITCHING   Items with * indicate a potential emergency and should be followed up as soon as possible or go to the Emergency Department if any problems should occur.  Please show the CHEMOTHERAPY ALERT CARD or  IMMUNOTHERAPY ALERT CARD at check-in to the Emergency Department and triage nurse.  Should you have questions after your visit or need to cancel or reschedule your appointment, please contact [facility] [place] AT [place]  [phone] and follow the prompts.  Office hours are 8:00 a.m. to 4:30 p.m. Monday - Friday. Please note that voicemails left after 4:00 p.m. may not be returned until the following business day.  We are closed weekends and major holidays. You have access to a nurse at all times for urgent questions. Please call the main number to the clinic [phone] and follow the prompts.  For any non-urgent questions, you may also contact your provider using MyChart. We now offer e-Visits for anyone 65 and older to request care online for non-urgent symptoms. For details visit [email or web address].   Also download the MyChart app! Go to the app store, search "MyChart", open the app, select , and log in with your MyChart username and password.  Masks are optional in the cancer centers. If you would like for your care team to wear a mask while they are taking care of you, please let them know. For doctor visits, patients may have with them one support person who is at least 78 years old. At this time, visitors are not allowed in the infusion area.

## 2021-10-29 NOTE — Progress Notes (Signed)
Patient daughter states Dr.finnegan stated if patient gets a 2nd UTI, He would put her on a antibiotics for the rest of treatment and she was wondering if that's the next step.

## 2021-10-29 NOTE — Progress Notes (Signed)
Nutrition Follow-up:  Patient with large B-Cell lymphoma.  Patient receiving R-CHOP.  Has been treated for another UTI.  Met with patient during infusion.  Patient reports that appetite is a little bit better this week than last.  Receiving fluids last week helped her.  She admits to not eating and drinking enough.  " I am just not hungry and if I eat too much I get sick."  Has been trying to snack on glucerna (planning to add some ice cream), yogurt, wheat thins.  Mostly eats cereal with banana or frosted flakes or muffin for breakfast.  Usually eats 1/2 of regular portion.      Medications: reviewed  Labs: reviewed  Anthropometrics:   Weight 161 lb 14.4 oz on 8/10 166 lb 6.4 oz on 7/20 176 lb on 6/29 185 lb on 4/17   NUTRITION DIAGNOSIS: Inadequate oral intake continues   INTERVENTION:  Continue glucerna shakes Discussed adding pimento cheese with wheat thins for added calories and protein Encouraged writing schedule to nibble/snack as reminder to eat and put in visible location.     MONITORING, EVALUATION, GOAL: weight trends, intake   NEXT VISIT: Thursday, August 31 during infusion  Tyrian Peart B. Zenia Resides, [place], [place] Registered Dietitian [phone]

## 2021-10-30 ENCOUNTER — Inpatient Hospital Stay: Payer: No Typology Code available for payment source

## 2021-10-30 DIAGNOSIS — C8338 Diffuse large B-cell lymphoma, lymph nodes of multiple sites: Secondary | ICD-10-CM

## 2021-10-30 DIAGNOSIS — Z5111 Encounter for antineoplastic chemotherapy: Secondary | ICD-10-CM | POA: Diagnosis not present

## 2021-10-30 MED ORDER — PEGFILGRASTIM INJECTION 6 MG/0.6ML ~~LOC~~
6.0000 mg | PREFILLED_SYRINGE | Freq: Once | SUBCUTANEOUS | Status: AC
  Administered 2021-10-30: 6 mg via SUBCUTANEOUS
  Filled 2021-10-30: qty 0.6

## 2021-11-03 ENCOUNTER — Inpatient Hospital Stay: Payer: No Typology Code available for payment source

## 2021-11-03 VITALS — BP 106/70 | HR 70 | Temp 98.9°F | Resp 18

## 2021-11-03 DIAGNOSIS — Z5111 Encounter for antineoplastic chemotherapy: Secondary | ICD-10-CM | POA: Diagnosis not present

## 2021-11-03 DIAGNOSIS — E860 Dehydration: Secondary | ICD-10-CM

## 2021-11-03 MED ORDER — SODIUM CHLORIDE 0.9 % IV SOLN
Freq: Once | INTRAVENOUS | Status: AC
  Filled 2021-11-03: qty 250

## 2021-11-03 MED ORDER — HEPARIN SOD (PORK) LOCK FLUSH 100 UNIT/ML IV SOLN
500.0000 [IU] | Freq: Once | INTRAVENOUS | Status: AC | PRN
  Administered 2021-11-03: 500 [IU]
  Filled 2021-11-03: qty 5

## 2021-11-03 MED ORDER — SODIUM CHLORIDE 0.9% FLUSH
10.0000 mL | Freq: Once | INTRAVENOUS | Status: AC | PRN
  Administered 2021-11-03: 10 mL
  Filled 2021-11-03: qty 10

## 2021-11-03 NOTE — Progress Notes (Signed)
Feeling well today. Has had some aching in her arms since treatment. Denies fever. Eating multiple small meals per day. Averaging about 50 oz of water per day. Received 1 L NS over 1 hour.

## 2021-11-05 ENCOUNTER — Inpatient Hospital Stay: Payer: No Typology Code available for payment source

## 2021-11-05 VITALS — BP 95/61 | HR 83 | Temp 98.8°F

## 2021-11-05 DIAGNOSIS — E860 Dehydration: Secondary | ICD-10-CM

## 2021-11-05 DIAGNOSIS — Z5111 Encounter for antineoplastic chemotherapy: Secondary | ICD-10-CM | POA: Diagnosis not present

## 2021-11-05 MED ORDER — SODIUM CHLORIDE 0.9% FLUSH
10.0000 mL | Freq: Once | INTRAVENOUS | Status: AC | PRN
  Administered 2021-11-05: 10 mL
  Filled 2021-11-05: qty 10

## 2021-11-05 MED ORDER — HEPARIN SOD (PORK) LOCK FLUSH 100 UNIT/ML IV SOLN
500.0000 [IU] | Freq: Once | INTRAVENOUS | Status: AC | PRN
  Administered 2021-11-05: 500 [IU]
  Filled 2021-11-05: qty 5

## 2021-11-05 MED ORDER — SODIUM CHLORIDE 0.9 % IV SOLN
Freq: Once | INTRAVENOUS | Status: AC
  Filled 2021-11-05: qty 250

## 2021-11-05 NOTE — Progress Notes (Signed)
Pt has some discomfort on tongue and has a sensation of something caught in esophagus. Pt using warm water rinses, instructed her to go back to magic mouthwash. No thrush noted on tongue. No fevers. No symptoms of UTI. Appetite is low. Pt received 1 L NS today. Discharged to home.

## 2021-11-06 ENCOUNTER — Inpatient Hospital Stay
Admission: EM | Admit: 2021-11-06 | Discharge: 2021-11-10 | DRG: 809 | Disposition: A | Discharge status: Home / Self Care | Payer: No Typology Code available for payment source | Attending: Internal Medicine | Admitting: Internal Medicine

## 2021-11-06 ENCOUNTER — Emergency Department: Payer: No Typology Code available for payment source

## 2021-11-06 ENCOUNTER — Other Ambulatory Visit: Payer: Self-pay | Admitting: Oncology

## 2021-11-06 ENCOUNTER — Other Ambulatory Visit: Payer: Self-pay

## 2021-11-06 DIAGNOSIS — E876 Hypokalemia: Secondary | ICD-10-CM | POA: Diagnosis present

## 2021-11-06 DIAGNOSIS — R5081 Fever presenting with conditions classified elsewhere: Secondary | ICD-10-CM | POA: Diagnosis present

## 2021-11-06 DIAGNOSIS — L98419 Non-pressure chronic ulcer of buttock with unspecified severity: Secondary | ICD-10-CM | POA: Diagnosis present

## 2021-11-06 DIAGNOSIS — F32A Depression, unspecified: Secondary | ICD-10-CM | POA: Diagnosis present

## 2021-11-06 DIAGNOSIS — E669 Obesity, unspecified: Secondary | ICD-10-CM | POA: Diagnosis present

## 2021-11-06 DIAGNOSIS — Z9012 Acquired absence of left breast and nipple: Secondary | ICD-10-CM

## 2021-11-06 DIAGNOSIS — E039 Hypothyroidism, unspecified: Secondary | ICD-10-CM | POA: Diagnosis present

## 2021-11-06 DIAGNOSIS — C8330 Diffuse large B-cell lymphoma, unspecified site: Secondary | ICD-10-CM | POA: Diagnosis present

## 2021-11-06 DIAGNOSIS — U099 Post covid-19 condition, unspecified: Secondary | ICD-10-CM | POA: Diagnosis present

## 2021-11-06 DIAGNOSIS — T451X5A Adverse effect of antineoplastic and immunosuppressive drugs, initial encounter: Secondary | ICD-10-CM | POA: Diagnosis present

## 2021-11-06 DIAGNOSIS — R7989 Other specified abnormal findings of blood chemistry: Secondary | ICD-10-CM | POA: Diagnosis present

## 2021-11-06 DIAGNOSIS — D61810 Antineoplastic chemotherapy induced pancytopenia: Secondary | ICD-10-CM | POA: Diagnosis present

## 2021-11-06 DIAGNOSIS — D849 Immunodeficiency, unspecified: Secondary | ICD-10-CM | POA: Diagnosis present

## 2021-11-06 DIAGNOSIS — Z6831 Body mass index (BMI) 31.0-31.9, adult: Secondary | ICD-10-CM

## 2021-11-06 DIAGNOSIS — E119 Type 2 diabetes mellitus without complications: Secondary | ICD-10-CM

## 2021-11-06 DIAGNOSIS — Z8249 Family history of ischemic heart disease and other diseases of the circulatory system: Secondary | ICD-10-CM

## 2021-11-06 DIAGNOSIS — C50412 Malignant neoplasm of upper-outer quadrant of left female breast: Secondary | ICD-10-CM | POA: Diagnosis present

## 2021-11-06 DIAGNOSIS — Z79890 Hormone replacement therapy (postmenopausal): Secondary | ICD-10-CM

## 2021-11-06 DIAGNOSIS — Z853 Personal history of malignant neoplasm of breast: Secondary | ICD-10-CM

## 2021-11-06 DIAGNOSIS — K219 Gastro-esophageal reflux disease without esophagitis: Secondary | ICD-10-CM | POA: Diagnosis present

## 2021-11-06 DIAGNOSIS — D701 Agranulocytosis secondary to cancer chemotherapy: Secondary | ICD-10-CM | POA: Diagnosis present

## 2021-11-06 DIAGNOSIS — I480 Paroxysmal atrial fibrillation: Secondary | ICD-10-CM | POA: Diagnosis present

## 2021-11-06 DIAGNOSIS — Z7901 Long term (current) use of anticoagulants: Secondary | ICD-10-CM

## 2021-11-06 DIAGNOSIS — Z20822 Contact with and (suspected) exposure to covid-19: Secondary | ICD-10-CM | POA: Diagnosis present

## 2021-11-06 DIAGNOSIS — U071 COVID-19: Secondary | ICD-10-CM | POA: Diagnosis present

## 2021-11-06 DIAGNOSIS — Z7951 Long term (current) use of inhaled steroids: Secondary | ICD-10-CM

## 2021-11-06 DIAGNOSIS — R531 Weakness: Secondary | ICD-10-CM | POA: Diagnosis present

## 2021-11-06 DIAGNOSIS — E038 Other specified hypothyroidism: Secondary | ICD-10-CM | POA: Diagnosis not present

## 2021-11-06 DIAGNOSIS — I2510 Atherosclerotic heart disease of native coronary artery without angina pectoris: Secondary | ICD-10-CM | POA: Diagnosis present

## 2021-11-06 DIAGNOSIS — L89322 Pressure ulcer of left buttock, stage 2: Secondary | ICD-10-CM | POA: Diagnosis not present

## 2021-11-06 DIAGNOSIS — C8338 Diffuse large B-cell lymphoma, lymph nodes of multiple sites: Secondary | ICD-10-CM

## 2021-11-06 DIAGNOSIS — Z803 Family history of malignant neoplasm of breast: Secondary | ICD-10-CM

## 2021-11-06 DIAGNOSIS — D709 Neutropenia, unspecified: Secondary | ICD-10-CM | POA: Diagnosis present

## 2021-11-06 DIAGNOSIS — D6959 Other secondary thrombocytopenia: Secondary | ICD-10-CM | POA: Diagnosis present

## 2021-11-06 DIAGNOSIS — J45909 Unspecified asthma, uncomplicated: Secondary | ICD-10-CM | POA: Diagnosis present

## 2021-11-06 DIAGNOSIS — D61818 Other pancytopenia: Secondary | ICD-10-CM | POA: Diagnosis present

## 2021-11-06 DIAGNOSIS — G4733 Obstructive sleep apnea (adult) (pediatric): Secondary | ICD-10-CM | POA: Diagnosis present

## 2021-11-06 DIAGNOSIS — A419 Sepsis, unspecified organism: Principal | ICD-10-CM | POA: Diagnosis present

## 2021-11-06 DIAGNOSIS — I10 Essential (primary) hypertension: Secondary | ICD-10-CM | POA: Diagnosis present

## 2021-11-06 DIAGNOSIS — E871 Hypo-osmolality and hyponatremia: Secondary | ICD-10-CM | POA: Diagnosis present

## 2021-11-06 DIAGNOSIS — R0981 Nasal congestion: Secondary | ICD-10-CM | POA: Diagnosis present

## 2021-11-06 DIAGNOSIS — Z79899 Other long term (current) drug therapy: Secondary | ICD-10-CM

## 2021-11-06 DIAGNOSIS — Z885 Allergy status to narcotic agent status: Secondary | ICD-10-CM

## 2021-11-06 DIAGNOSIS — A4189 Other specified sepsis: Secondary | ICD-10-CM | POA: Diagnosis present

## 2021-11-06 DIAGNOSIS — Z923 Personal history of irradiation: Secondary | ICD-10-CM

## 2021-11-06 LAB — LACTIC ACID, PLASMA: Lactic Acid, Venous: 0.9 mmol/L (ref 0.5–1.9)

## 2021-11-06 LAB — COMPREHENSIVE METABOLIC PANEL
ALT: 40 U/L (ref 0–44)
AST: 23 U/L (ref 15–41)
Albumin: 3 g/dL — ABNORMAL LOW (ref 3.5–5.0)
Alkaline Phosphatase: 112 U/L (ref 38–126)
Anion gap: 9 (ref 5–15)
BUN: 9 mg/dL (ref 8–23)
CO2: 23 mmol/L (ref 22–32)
Calcium: 8.7 mg/dL — ABNORMAL LOW (ref 8.9–10.3)
Chloride: 102 mmol/L (ref 98–111)
Creatinine, Ser: 0.52 mg/dL (ref 0.44–1.00)
GFR, Estimated: >60 mL/min (ref >60)
Glucose, Bld: 118 mg/dL — ABNORMAL HIGH (ref 70–99)
Potassium: 3.4 mmol/L — ABNORMAL LOW (ref 3.5–5.1)
Sodium: 134 mmol/L — ABNORMAL LOW (ref 135–145)
Total Bilirubin: 1.2 mg/dL (ref 0.3–1.2)
Total Protein: 5.8 g/dL — ABNORMAL LOW (ref 6.5–8.1)

## 2021-11-06 LAB — CBC WITH DIFFERENTIAL/PLATELET
Abs Immature Granulocytes: 0 10*3/uL (ref 0.00–0.07)
Basophils Absolute: 0 10*3/uL (ref 0.0–0.1)
Basophils Relative: 7 %
Eosinophils Absolute: 0 10*3/uL (ref 0.0–0.5)
Eosinophils Relative: 7 %
HCT: 23.9 % — ABNORMAL LOW (ref 36.0–46.0)
Hemoglobin: 7.6 g/dL — ABNORMAL LOW (ref 12.0–15.0)
Immature Granulocytes: 0 %
Lymphocytes Relative: 79 %
Lymphs Abs: 0.1 10*3/uL — ABNORMAL LOW (ref 0.7–4.0)
MCH: 26 pg (ref 26.0–34.0)
MCHC: 31.8 g/dL (ref 30.0–36.0)
MCV: 81.8 fL (ref 80.0–100.0)
Monocytes Absolute: 0 10*3/uL — ABNORMAL LOW (ref 0.1–1.0)
Monocytes Relative: 7 %
Neutro Abs: 0 10*3/uL — CL (ref 1.7–7.7)
Neutrophils Relative %: 0 %
Platelets: 18 10*3/uL — CL (ref 150–400)
RBC: 2.92 MIL/uL — ABNORMAL LOW (ref 3.87–5.11)
RDW: 23.9 % — ABNORMAL HIGH (ref 11.5–15.5)
Smear Review: NORMAL
WBC: 0.1 10*3/uL — CL (ref 4.0–10.5)
nRBC: 0 % (ref 0.0–0.2)

## 2021-11-06 LAB — APTT: aPTT: 43 seconds — ABNORMAL HIGH (ref 24–36)

## 2021-11-06 LAB — URINALYSIS, ROUTINE W REFLEX MICROSCOPIC
Bilirubin Urine: NEGATIVE
Glucose, UA: NEGATIVE mg/dL
Hgb urine dipstick: NEGATIVE
Ketones, ur: NEGATIVE mg/dL
Leukocytes,Ua: NEGATIVE
Nitrite: NEGATIVE
Protein, ur: NEGATIVE mg/dL
Specific Gravity, Urine: 1.012 (ref 1.005–1.030)
pH: 8 (ref 5.0–8.0)

## 2021-11-06 LAB — PROTIME-INR
INR: 1.2 (ref 0.8–1.2)
Prothrombin Time: 14.8 seconds (ref 11.4–15.2)

## 2021-11-06 LAB — RESP PANEL BY RT-PCR (FLU A&B, COVID) ARPGX2
Influenza A by PCR: NEGATIVE
Influenza B by PCR: NEGATIVE
SARS Coronavirus 2 by RT PCR: POSITIVE — AB

## 2021-11-06 MED ORDER — METRONIDAZOLE 500 MG/100ML IV SOLN
500.0000 mg | Freq: Once | INTRAVENOUS | Status: AC
  Administered 2021-11-06: 500 mg via INTRAVENOUS
  Filled 2021-11-06: qty 100

## 2021-11-06 MED ORDER — VANCOMYCIN HCL 1500 MG/300ML IV SOLN
1500.0000 mg | Freq: Once | INTRAVENOUS | Status: AC
  Administered 2021-11-06: 1500 mg via INTRAVENOUS
  Filled 2021-11-06: qty 300

## 2021-11-06 MED ORDER — LACTATED RINGERS IV SOLN: INTRAVENOUS | Status: AC

## 2021-11-06 MED ORDER — SODIUM CHLORIDE 0.9 % IV SOLN
2.0000 g | Freq: Two times a day (BID) | INTRAVENOUS | Status: DC
  Administered 2021-11-07 – 2021-11-10 (×7): 2 g via INTRAVENOUS
  Filled 2021-11-06 (×7): qty 12.5
  Filled 2021-11-06: qty 2

## 2021-11-06 MED ORDER — VANCOMYCIN HCL IN DEXTROSE 1-5 GM/200ML-% IV SOLN: 1000.0000 mg | Freq: Once | INTRAVENOUS | Status: DC

## 2021-11-06 MED ORDER — SODIUM CHLORIDE 0.9 % IV BOLUS (SEPSIS)
1000.0000 mL | Freq: Once | INTRAVENOUS | Status: AC
  Administered 2021-11-06: 1000 mL via INTRAVENOUS

## 2021-11-06 MED ORDER — CEFEPIME HCL 2 G IV SOLR
2.0000 g | Freq: Once | INTRAVENOUS | Status: AC
  Administered 2021-11-06: 2 g via INTRAVENOUS
  Filled 2021-11-06: qty 12.5

## 2021-11-06 MED ORDER — VANCOMYCIN HCL 1250 MG/250ML IV SOLN
1250.0000 mg | INTRAVENOUS | Status: DC
  Administered 2021-11-07 – 2021-11-08 (×2): 1250 mg via INTRAVENOUS
  Filled 2021-11-06 (×3): qty 250

## 2021-11-06 NOTE — ED Triage Notes (Signed)
Pt is receiving chemo, recent admission for sepsis, who presents tonight with fever. Pt has had no antipyretic at home. Pt denies vomiting, cough.

## 2021-11-06 NOTE — H&P (Signed)
History and Physical    Patient: Stacey Stone OAC:[id number] DOB: 1945/10/30 DOA: 11/06/2021 DOS: the patient was seen and examined on 11/06/2021 PCP: Rusty Aus, MD  Patient coming from: {Point_of_Origin:[address]}  Chief Complaint:  Chief Complaint  Patient presents with   Code Sepsis   HPI: Stacey Stone is a 76 y.o. female with medical history significant of ***  Review of Systems: {ROS_Text:26778} Past Medical History:  Diagnosis Date   Abdominal pain, left lower quadrant    epiploic appendagitis   Anginal pain ([place])    Arthritis 06/06/2018   knee   Asthma    mild   Atrial fibrillation ([place])    Breast cancer ([place])    Coronary artery disease 06/06/2018   1 artery 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 Mamie Nick. 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   No pertinent past medical history    Pancytopenia ([place])    Personal history of radiation therapy    Sleep apnea 06/06/2018   O2- 2l at bedtime and BiPap @ bedtime   Past Surgical History:  Procedure Laterality Date   BREAST BIOPSY Left 2/136/2018   INVASIVE MAMMARY CARCINOMA   BREAST BIOPSY Right 05/25/2016   FIBROCYSTIC CHANGE WITH CALCIFICATIONS    BREAST BIOPSY Right 05/29/2020   Affirm bx-"X" clip-FIBROADENOMA WITH ASSOCIATED CALCIFICATIONS. - NEGATIVE   BREAST EXCISIONAL BIOPSY Left 06/04/2016   INVASIVE MAMMARY CARCINOMA.    BREAST LUMPECTOMY Left 06/04/2016   INVASIVE MAMMARY CARCINOMA.    CARDIAC CATHETERIZATION     CARDIAC CATHETERIZATION N/A 09/24/2015   Procedure: Left Heart Cath and Coronary Angiography;  Surgeon: Isaias Cowman, MD;  Location: [place] [facility] [facility];  Service: Cardiovascular;  Laterality: N/A;   CARDIAC CATHETERIZATION N/A  09/24/2015   Procedure: Coronary Stent Intervention;  Surgeon: Isaias Cowman, MD;  Location: [place] [facility];  Service: Cardiovascular;  Laterality: N/A;   CHOLECYSTECTOMY  11/26   08/1970   COLONOSCOPY WITH PROPOFOL N/A 07/08/2021   Procedure: COLONOSCOPY WITH PROPOFOL;  Surgeon: Toledo, Benay Pike, MD;  Location: [facility];  Service: Gastroenterology;  Laterality: N/A;   DILATATION & CURETTAGE/HYSTEROSCOPY WITH MYOSURE N/A 05/05/2015   Procedure: Hysteroscopy and endometrial curretage;  Surgeon: Boykin Nearing, MD;  Location: [facility];  Service: Gynecology;  Laterality: N/A;   DILATION AND CURETTAGE OF UTERUS     EXCISION MASS FROM NECK  Right    Dr. Tami Ribas   EYE SURGERY  05/29/2020   bil cataract 9/08   HAMMER TOE SURGERY Right    IR BONE MARROW BIOPSY & ASPIRATION  09/09/2021   IR IMAGING GUIDED PORT INSERTION  09/09/2021   JOINT REPLACEMENT Bilateral 2008, 11/26/ 2012   Partial Knee Replacements   LEFT HEART CATH AND CORONARY ANGIOGRAPHY N/A 10/25/2017   Procedure: LEFT HEART CATH AND CORONARY ANGIOGRAPHY;  Surgeon: Teodoro Spray, MD;  Location: [place] [facility];  Service: Cardiovascular;  Laterality: N/A;   LEFT HEART CATH AND CORONARY ANGIOGRAPHY N/A 10/26/2017   Procedure: LEFT HEART CATH AND CORONARY ANGIOGRAPHY;  Surgeon: Isaias Cowman, MD;  Location: [place] [facility];  Service: Cardiovascular;  Laterality: N/A;   PARTIAL MASTECTOMY WITH NEEDLE LOCALIZATION Left 06/04/2016   Procedure: PARTIAL MASTECTOMY WITH NEEDLE LOCALIZATION;  Surgeon: Leonie Green, MD;  Location: [facility];  Service: General;  Laterality: Left;   SCLERAL BUCKLE  02/16/2011   Procedure: SCLERAL BUCKLE;  Surgeon: Hayden Pedro, MD;  Location: [facility];  Service: Ophthalmology;  Laterality: Left;  Scleral Buckle Left Eye with Headscope Laser   SENTINEL NODE BIOPSY Left 06/04/2016   Procedure: SENTINEL NODE BIOPSY;  Surgeon: Leonie Green, MD;  Location: [facility];   Service: General;  Laterality: Left;   Social History:  reports that she has never smoked. She has never used smokeless tobacco. She reports that she does not drink alcohol and does not use drugs.  Allergies  Allergen Reactions   Codeine Other (See Comments)    Stroke like symptoms   Tramadol Itching    Family History  Problem Relation Age of Onset   Breast cancer Mother 54   Heart disease Father    Breast cancer Paternal Aunt 49   Breast cancer Cousin    Anesthesia problems Neg Hx    Hypotension Neg Hx    Malignant hyperthermia Neg Hx    Pseudochol deficiency Neg Hx     Prior to Admission medications   Medication Sig Start Date End Date Taking? Authorizing Provider  acetaminophen (TYLENOL) 500 MG tablet Take 500 mg by mouth every 4 (four) hours as needed.    [provider]  amLODipine (NORVASC) 10 MG tablet Take 1 tablet (10 mg total) by mouth daily. 09/28/21   Emeterio Reeve, DO  apixaban (ELIQUIS) 5 MG TABS tablet Take 5 mg by mouth 2 (two) times daily.    [provider]  atorvastatin (LIPITOR) 40 MG tablet Take 1 tablet (40 mg total) by mouth at bedtime. 05/29/19   Enzo Bi, MD  diltiazem (CARDIZEM) 30 MG tablet Take 30 mg by mouth 2 (two) times daily as needed (for breakthrough afib).  Patient not taking: Reported on 07/08/2021    [provider]  famotidine (PEPCID) 40 MG tablet Take 40 mg by mouth at bedtime.    [provider]  ferrous sulfate 325 (65 FE) MG tablet Take 325 mg by mouth once a week. Patient not taking: Reported on 09/23/2021    [provider]  Fluticasone-Salmeterol (ADVAIR) 100-50 MCG/DOSE AEPB Inhale 1 puff into the lungs 2 (two) times daily as needed (for asthma.).    [provider]  isosorbide mononitrate (IMDUR) 30 MG 24 hr tablet Take 30 mg by mouth daily.    [provider]  letrozole (FEMARA) 2.5 MG tablet TAKE 1 TABLET BY MOUTH ONCE DAILY 08/11/20   Lloyd Huger, MD   levothyroxine (SYNTHROID) 125 MCG tablet Take 125 mcg by mouth daily.    [provider]  magic mouthwash (nystatin, hydrocortisone, diphenhydrAMINE) suspension Swish and spit 5 mLs 3 (three) times daily as needed for mouth pain. 09/23/21   Hughie Closs, PA-C  nitroGLYCERIN (NITROSTAT) 0.4 MG SL tablet Place 0.4 mg under the tongue every 5 (five) minutes as needed for chest pain. Maximum 3 doses, If no relief call md or 911. Patient not taking: Reported on 09/23/2021    [provider]  potassium chloride (KLOR-CON) 10 MEQ tablet Take 1 tablet (10 mEq total) by mouth 2 (two) times daily. 05/29/19   Enzo Bi, MD  RABEprazole (ACIPHEX) 20 MG tablet Take 20 mg by mouth daily.    [provider]  sertraline (ZOLOFT) 50 MG tablet Take 50 mg by mouth at bedtime.    [provider]  sotalol (BETAPACE) 80 MG tablet Take 80 mg by mouth 2 (two) times daily. 09/12/19   [provider]  sulfamethoxazole-trimethoprim (BACTRIM DS) 800-160 MG tablet Take 1 tablet by mouth 2 (two) times daily. 10/29/21   Lloyd Huger, MD  torsemide (DEMADEX) 20 MG tablet Take 10 mg by mouth as directed. Take on Tuesday and Friday Patient not taking: Reported on 09/23/2021    [provider]  allopurinol (ZYLOPRIM) 300 MG tablet Take 1 tablet (300 mg total) by mouth daily. 09/11/21 11/06/21  Lloyd Huger, MD  prochlorperazine (COMPAZINE) 10 MG tablet Take 1 tablet (10 mg total) by mouth every 6 (six) hours as needed (Nausea or vomiting). 09/11/21 11/06/21  Lloyd Huger, MD    Physical Exam: Vitals:   11/06/21 2030 11/06/21 2100 11/06/21 2130 11/06/21 2153  BP: (!) 142/76 136/79 137/65   Pulse: 86 92 88   Resp:  (!) 23 18   Temp:    (!) 101.1 F (38.4 C)  TempSrc:    Oral  SpO2: 97% 97% 95%   Weight:      Height:       *** Data Reviewed: {Tip this will not be part of the note when signed- Document your independent interpretation of telemetry tracing,  EKG, lab, Radiology test or any other diagnostic tests. Add any new diagnostic test ordered today. (Optional):26781} {Results:26384}  Assessment and Plan: No notes have been filed under this hospital service. Service: Hospitalist     Advance Care Planning:   Code Status: Prior ***  Consults: ***  Family Communication: ***  Severity of Illness: {Observation/Inpatient:21159}  AuthorBarbette Merino, MD 11/06/2021 10:01 PM  For on call review [email or web address].

## 2021-11-06 NOTE — Progress Notes (Signed)
The following biosimilar Truxima (rituximab-abbs) has been selected for use in this patient.

## 2021-11-06 NOTE — Progress Notes (Signed)
Pt being followed by [facility] for Sepsis protocol. 

## 2021-11-06 NOTE — ED Provider Notes (Signed)
[facility] Provider Note    Event Date/Time   First MD Initiated Contact with Patient 11/06/21 2009     (approximate)  History   Chief Complaint: Code Sepsis  HPI  Stacey Stone is a 76 y.o. female with a past medical history of asthma, atrial fibrillation, CAD, diabetes, hypertension, lymphoma on chemotherapy presents to the emergency department for a fever.  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  Patient states today she has been feeling very weak mild nasal congestion.  Patient found to have a fever to 102.2 in the emergency department.  Denies any cough vomiting diarrhea chest pain or abdominal pain.  Patient states last month at the beginning of July after her first chemo dose she also had a fever and ended up testing positive for COVID at that time, as well as urinary tract infection/sepsis.  Physical Exam   Triage Vital Signs: ED Triage Vitals  Enc Vitals Group     BP 11/06/21 1955 119/82     Pulse Rate 11/06/21 1955 95     Resp 11/06/21 1955 18     Temp 11/06/21 1955 (!) 102.2 F (39 C)     Temp Source 11/06/21 1955 Oral     SpO2 11/06/21 1955 96 %     Weight 11/06/21 1956 166 lb (75.3 kg)     Height 11/06/21 1956 '5\' 2"'$  (1.575 m)     Head Circumference --      Peak Flow --      Pain Score 11/06/21 1956 0     Pain Loc --      Pain Edu? --      Excl. in [place]? --     Most recent vital signs: Vitals:   11/06/21 1955  BP: 119/82  Pulse: 95  Resp: 18  Temp: (!) 102.2 F (39 C)  SpO2: 96%    General: Awake, no distress.  CV:  Good peripheral perfusion.  Regular rate and rhythm around 100 bpm. Resp:  Normal effort.  Equal breath sounds bilaterally.  No obvious wheeze rales or rhonchi. Abd:  No distention.  Soft, nontender.  No rebound or guarding.   ED Results / Procedures / Treatments   EKG  EKG viewed and interpreted by myself shows a normal sinus  rhythm at 88 bpm with a narrow QRS, normal axis, normal intervals, nonspecific ST changes.  RADIOLOGY  I reviewed and interpreted the x-ray images I do not see any obvious consolidation on my evaluation. Radiology is read the chest x-ray is negative.   MEDICATIONS ORDERED IN ED: Medications  lactated ringers infusion (has no administration in time range)  sodium chloride 0.9 % bolus 1,000 mL (has no administration in time range)  ceFEPIme (MAXIPIME) 2 g in sodium chloride 0.9 % 100 mL IVPB (has no administration in time range)  metroNIDAZOLE (FLAGYL) IVPB 500 mg (has no administration in time range)  vancomycin (VANCOCIN) IVPB 1000 mg/200 mL premix (has no administration in time range)     IMPRESSION / MDM / ASSESSMENT AND PLAN / ED COURSE  I reviewed the triage vital signs and the nursing notes.  Patient's presentation is most consistent with acute presentation with potential threat to life or bodily function.  Patient presents emergency department for weakness.  Patient is currently on chemotherapy last dose was 10/29/2021.  Patient found to be febrile 102.2 with a heart rate around 95 meeting sepsis criteria.  We will check labs, cultures, urine, COVID, chest x-ray.  We will start the patient on broad-spectrum antibiotics and dose IV fluids.  We will dose Tylenol as an antipyretic and continue to closely monitor in the emergency department.  Differential is quite broad but would include bacteremia, pneumonia, UTI, COVID and other viral illness among other infectious etiologies.  Patient's labs have resulted showing neutropenia with a white blood cell count of 0.1 and absolute neutrophil count of 0.0.  Patient is being covered broad-spectrum antibiotics, blood cultures have been sent.  Chemistry does show no concerning findings, urinalysis appears normal.  Patient is testing positive for COVID once again however she states she tested + July 4.  Is not clear if this is a new infection or an  ongoing positive test however as the patient is febrile to 101 is neutropenic and COVID-positive we will admit to the hospitalist service for further work-up and treatment.  We will still cover with IV antibiotics until the patient's cultures grew out.  Patient agreeable to plan.  CRITICAL CARE Performed by: Harvest Dark   Total critical care time: 30 minutes  Critical care time was exclusive of separately billable procedures and treating other patients.  Critical care was necessary to treat or prevent imminent or life-threatening deterioration.  Critical care was time spent personally by me on the following activities: development of treatment plan with patient and/or surrogate as well as nursing, discussions with consultants, evaluation of patient's response to treatment, examination of patient, obtaining history from patient or surrogate, ordering and performing treatments and interventions, ordering and review of laboratory studies, ordering and review of radiographic studies, pulse oximetry and re-evaluation of patient's condition.   FINAL CLINICAL IMPRESSION(S) / ED DIAGNOSES   Sepsis COVID-19 Neutropenic fever  Note:  This document was prepared using Dragon voice recognition software and may include unintentional dictation errors.   Harvest Dark, MD 11/06/21 2239

## 2021-11-06 NOTE — ED Notes (Signed)
PT brought to ED rm 34 at this time, this RN now assuming care.

## 2021-11-06 NOTE — Progress Notes (Signed)
ON PATHWAY REGIMEN - Lymphoma and CLL  No Change  Continue With Treatment as Ordered.  Original Decision Date/Time: 09/10/2021 14:15     A cycle is every 21 days:     Prednisone      Rituximab-xxxx      Cyclophosphamide      Doxorubicin      Vincristine   **Always confirm dose/schedule in your pharmacy ordering system**  Patient Characteristics: Diffuse Large B-Cell Lymphoma or Follicular Lymphoma, Grade 3B, First Line, Stage III and IV Disease Type: Not Applicable Disease Type: Diffuse Large B-Cell Lymphoma Disease Type: Not Applicable Line of therapy: First Line Intent of Therapy: Curative Intent, Discussed with Patient

## 2021-11-06 NOTE — Code Documentation (Signed)
CODE SEPSIS - PHARMACY COMMUNICATION  **Broad Spectrum Antibiotics should be administered within 1 hour of Sepsis diagnosis**  Time Code Sepsis Called/Page Received: 8  Antibiotics Ordered: Flagyl + Cefepime + Vancomycin  Time of 1st antibiotic administration: Flagyl @ 2106  Additional action taken by pharmacy: Contacted RN  If necessary, Name of Provider/Nurse Contacted: Charmian Muff, RN    Darrick Penna ,PharmD Clinical Pharmacist  11/06/2021  8:21 PM

## 2021-11-06 NOTE — Consult Note (Signed)
Pharmacy Antibiotic Note  Stacey Stone is a 76 y.o. female admitted on 11/06/2021 with  febrile neutropenia .  Pharmacy has been consulted for cefepime and vancomycin dosing.  8/18 Vancomycin '1500mg'$  IV x1 I ED  Plan: Give Cefepime 2g IV every 12 hours Vancomycin '1250mg'$  IV every 12 hours Goal AUC 400-550  Est AUC: 535.4 Est Cmax: 35.8 Est Cmin: 13.6 Calculated with SCr 0.8   Height: '5\' 2"'$  (157.5 cm) Weight: 75.3 kg (166 lb) IBW/kg (Calculated) : 50.1  Temp (24hrs), Avg:101.7 F (38.7 C), Min:101.1 F (38.4 C), Max:102.2 F (39 C)  Recent Labs  Lab 11/06/21 2033  WBC 0.1*  CREATININE 0.52  LATICACIDVEN 0.9    Estimated Creatinine Clearance: 56 mL/min (by C-G formula based on SCr of 0.52 mg/dL).    Allergies  Allergen Reactions   Codeine Other (See Comments)    Stroke like symptoms   Tramadol Itching    Antimicrobials this admission: 8/18 Cefepime >>  8/18 Vancomycin >>    Microbiology results: 8/18 BCx: sent 8/18 MRSA PCR: sent  Thank you for allowing pharmacy to be a part of this patient's care.  Darrick Penna 11/06/2021 10:04 PM

## 2021-11-06 NOTE — Consult Note (Signed)
PHARMACY -  BRIEF ANTIBIOTIC NOTE   Pharmacy has received consult(s) for cefepime and vancomycin from an ED provider.  The patient's profile has been reviewed for ht/wt/allergies/indication/available labs.    One time order(s) placed for Give Cefepime 2g IV x1 and Vancomycin '1500mg'$  IV x1  Further antibiotics/pharmacy consults should be ordered by admitting physician if indicated.                       Thank you, Darrick Penna 11/06/2021  8:20 PM

## 2021-11-07 ENCOUNTER — Encounter: Payer: Self-pay | Admitting: Internal Medicine

## 2021-11-07 ENCOUNTER — Other Ambulatory Visit: Payer: Self-pay

## 2021-11-07 DIAGNOSIS — G4733 Obstructive sleep apnea (adult) (pediatric): Secondary | ICD-10-CM

## 2021-11-07 DIAGNOSIS — A4189 Other specified sepsis: Secondary | ICD-10-CM

## 2021-11-07 DIAGNOSIS — C8330 Diffuse large B-cell lymphoma, unspecified site: Secondary | ICD-10-CM

## 2021-11-07 DIAGNOSIS — D6959 Other secondary thrombocytopenia: Secondary | ICD-10-CM

## 2021-11-07 DIAGNOSIS — E119 Type 2 diabetes mellitus without complications: Secondary | ICD-10-CM | POA: Diagnosis not present

## 2021-11-07 DIAGNOSIS — C50412 Malignant neoplasm of upper-outer quadrant of left female breast: Secondary | ICD-10-CM

## 2021-11-07 DIAGNOSIS — A419 Sepsis, unspecified organism: Secondary | ICD-10-CM

## 2021-11-07 DIAGNOSIS — E038 Other specified hypothyroidism: Secondary | ICD-10-CM

## 2021-11-07 DIAGNOSIS — I2510 Atherosclerotic heart disease of native coronary artery without angina pectoris: Secondary | ICD-10-CM

## 2021-11-07 DIAGNOSIS — E876 Hypokalemia: Secondary | ICD-10-CM

## 2021-11-07 DIAGNOSIS — I480 Paroxysmal atrial fibrillation: Secondary | ICD-10-CM

## 2021-11-07 DIAGNOSIS — D61810 Antineoplastic chemotherapy induced pancytopenia: Secondary | ICD-10-CM

## 2021-11-07 DIAGNOSIS — U071 COVID-19: Secondary | ICD-10-CM

## 2021-11-07 DIAGNOSIS — T50905A Adverse effect of unspecified drugs, medicaments and biological substances, initial encounter: Secondary | ICD-10-CM

## 2021-11-07 DIAGNOSIS — I10 Essential (primary) hypertension: Secondary | ICD-10-CM

## 2021-11-07 DIAGNOSIS — Z9989 Dependence on other enabling machines and devices: Secondary | ICD-10-CM

## 2021-11-07 DIAGNOSIS — D709 Neutropenia, unspecified: Secondary | ICD-10-CM | POA: Diagnosis not present

## 2021-11-07 LAB — COMPREHENSIVE METABOLIC PANEL
ALT: 36 U/L (ref 0–44)
ALT: 35 U/L (ref 0–44)
AST: 25 U/L (ref 15–41)
AST: 23 U/L (ref 15–41)
Albumin: 2.6 g/dL — ABNORMAL LOW (ref 3.5–5.0)
Albumin: 2.5 g/dL — ABNORMAL LOW (ref 3.5–5.0)
Alkaline Phosphatase: 99 U/L (ref 38–126)
Alkaline Phosphatase: 95 U/L (ref 38–126)
Anion gap: 7 (ref 5–15)
Anion gap: 6 (ref 5–15)
BUN: 8 mg/dL (ref 8–23)
BUN: 7 mg/dL — ABNORMAL LOW (ref 8–23)
CO2: 21 mmol/L — ABNORMAL LOW (ref 22–32)
CO2: 23 mmol/L (ref 22–32)
Calcium: 8.1 mg/dL — ABNORMAL LOW (ref 8.9–10.3)
Calcium: 8.1 mg/dL — ABNORMAL LOW (ref 8.9–10.3)
Chloride: 105 mmol/L (ref 98–111)
Chloride: 103 mmol/L (ref 98–111)
Creatinine, Ser: 0.38 mg/dL — ABNORMAL LOW (ref 0.44–1.00)
Creatinine, Ser: 0.34 mg/dL — ABNORMAL LOW (ref 0.44–1.00)
GFR, Estimated: >60 mL/min (ref >60)
GFR, Estimated: >60 mL/min (ref >60)
Glucose, Bld: 91 mg/dL (ref 70–99)
Glucose, Bld: 87 mg/dL (ref 70–99)
Potassium: 3.1 mmol/L — ABNORMAL LOW (ref 3.5–5.1)
Potassium: 2.9 mmol/L — ABNORMAL LOW (ref 3.5–5.1)
Sodium: 133 mmol/L — ABNORMAL LOW (ref 135–145)
Sodium: 132 mmol/L — ABNORMAL LOW (ref 135–145)
Total Bilirubin: 1.1 mg/dL (ref 0.3–1.2)
Total Bilirubin: 1.1 mg/dL (ref 0.3–1.2)
Total Protein: 5.2 g/dL — ABNORMAL LOW (ref 6.5–8.1)
Total Protein: 5.2 g/dL — ABNORMAL LOW (ref 6.5–8.1)

## 2021-11-07 LAB — PROCALCITONIN
Procalcitonin: <0.1 ng/mL
Procalcitonin: <0.1 ng/mL

## 2021-11-07 LAB — CBC WITH DIFFERENTIAL/PLATELET
Abs Immature Granulocytes: 0 10*3/uL (ref 0.00–0.07)
Basophils Absolute: 0 10*3/uL (ref 0.0–0.1)
Basophils Relative: 0 %
Eosinophils Absolute: 0 10*3/uL (ref 0.0–0.5)
Eosinophils Relative: 0 %
HCT: 19.5 % — ABNORMAL LOW (ref 36.0–46.0)
Hemoglobin: 6.5 g/dL — ABNORMAL LOW (ref 12.0–15.0)
Immature Granulocytes: 0 %
Lymphocytes Relative: 75 %
Lymphs Abs: 0.1 10*3/uL — ABNORMAL LOW (ref 0.7–4.0)
MCH: 26.6 pg (ref 26.0–34.0)
MCHC: 33.3 g/dL (ref 30.0–36.0)
MCV: 79.9 fL — ABNORMAL LOW (ref 80.0–100.0)
Monocytes Absolute: 0 10*3/uL — ABNORMAL LOW (ref 0.1–1.0)
Monocytes Relative: 8 %
Neutro Abs: 0 10*3/uL — CL (ref 1.7–7.7)
Neutrophils Relative %: 17 %
Platelets: 12 10*3/uL — CL (ref 150–400)
RBC: 2.44 MIL/uL — ABNORMAL LOW (ref 3.87–5.11)
RDW: 23.6 % — ABNORMAL HIGH (ref 11.5–15.5)
Smear Review: DECREASED
WBC: 0.1 10*3/uL — CL (ref 4.0–10.5)
nRBC: 0 % (ref 0.0–0.2)

## 2021-11-07 LAB — MRSA NEXT GEN BY PCR, NASAL: MRSA by PCR Next Gen: NOT DETECTED

## 2021-11-07 LAB — MAGNESIUM: Magnesium: 1.9 mg/dL (ref 1.7–2.4)

## 2021-11-07 LAB — LACTATE DEHYDROGENASE: LDH: 90 U/L — ABNORMAL LOW (ref 98–192)

## 2021-11-07 LAB — CBC
HCT: 21.1 % — ABNORMAL LOW (ref 36.0–46.0)
Hemoglobin: 6.7 g/dL — ABNORMAL LOW (ref 12.0–15.0)
MCH: 26 pg (ref 26.0–34.0)
MCHC: 31.8 g/dL (ref 30.0–36.0)
MCV: 81.8 fL (ref 80.0–100.0)
Platelets: 14 10*3/uL — CL (ref 150–400)
RBC: 2.58 MIL/uL — ABNORMAL LOW (ref 3.87–5.11)
RDW: 23.7 % — ABNORMAL HIGH (ref 11.5–15.5)
WBC: 0.1 10*3/uL — CL (ref 4.0–10.5)
nRBC: 0 % (ref 0.0–0.2)

## 2021-11-07 LAB — PROTIME-INR
INR: 1.1 (ref 0.8–1.2)
Prothrombin Time: 14.2 seconds (ref 11.4–15.2)

## 2021-11-07 LAB — CORTISOL-AM, BLOOD: Cortisol - AM: 13.6 ug/dL (ref 6.7–22.6)

## 2021-11-07 LAB — PHOSPHORUS: Phosphorus: 2.6 mg/dL (ref 2.5–4.6)

## 2021-11-07 LAB — C-REACTIVE PROTEIN: CRP: 15.1 mg/dL — ABNORMAL HIGH (ref <1.0)

## 2021-11-07 LAB — PREPARE RBC (CROSSMATCH)

## 2021-11-07 LAB — D-DIMER, QUANTITATIVE: D-Dimer, Quant: 0.82 ug/mL-FEU — ABNORMAL HIGH (ref 0.00–0.50)

## 2021-11-07 LAB — FERRITIN: Ferritin: 664 ng/mL — ABNORMAL HIGH (ref 11–307)

## 2021-11-07 MED ORDER — SOTALOL HCL 80 MG PO TABS
80.0000 mg | ORAL_TABLET | ORAL | Status: DC
  Administered 2021-11-07 – 2021-11-08 (×2): 80 mg via ORAL
  Filled 2021-11-07 (×2): qty 1

## 2021-11-07 MED ORDER — SODIUM CHLORIDE 0.9 % IV SOLN: 2.0000 g | Freq: Once | INTRAVENOUS | Status: DC

## 2021-11-07 MED ORDER — MAGNESIUM SULFATE 2 GM/50ML IV SOLN
2.0000 g | Freq: Once | INTRAVENOUS | Status: AC
  Administered 2021-11-07: 2 g via INTRAVENOUS
  Filled 2021-11-07: qty 50

## 2021-11-07 MED ORDER — SOTALOL HCL 80 MG PO TABS
80.0000 mg | ORAL_TABLET | Freq: Once | ORAL | Status: AC
Start: 2021-11-07 — End: 2021-11-07
  Administered 2021-11-07: 80 mg via ORAL
  Filled 2021-11-07: qty 1

## 2021-11-07 MED ORDER — POTASSIUM CHLORIDE CRYS ER 20 MEQ PO TBCR
40.0000 meq | EXTENDED_RELEASE_TABLET | ORAL | Status: AC
  Administered 2021-11-07 (×2): 40 meq via ORAL
  Filled 2021-11-07 (×2): qty 2

## 2021-11-07 MED ORDER — IPRATROPIUM-ALBUTEROL 20-100 MCG/ACT IN AERS
1.0000 | INHALATION_SPRAY | Freq: Four times a day (QID) | RESPIRATORY_TRACT | Status: DC
  Administered 2021-11-08: 1 via RESPIRATORY_TRACT
  Filled 2021-11-07: qty 4

## 2021-11-07 MED ORDER — ONDANSETRON HCL 4 MG PO TABS: 4.0000 mg | ORAL_TABLET | Freq: Four times a day (QID) | ORAL | Status: DC | PRN

## 2021-11-07 MED ORDER — ACETAMINOPHEN 650 MG RE SUPP: 650.0000 mg | Freq: Four times a day (QID) | RECTAL | Status: DC | PRN

## 2021-11-07 MED ORDER — TBO-FILGRASTIM 300 MCG/0.5ML ~~LOC~~ SOSY: 300.0000 ug | PREFILLED_SYRINGE | Freq: Once | SUBCUTANEOUS | Status: DC

## 2021-11-07 MED ORDER — SERTRALINE HCL 50 MG PO TABS
50.0000 mg | ORAL_TABLET | Freq: Every day | ORAL | Status: DC
  Administered 2021-11-07 – 2021-11-09 (×3): 50 mg via ORAL
  Filled 2021-11-07 (×3): qty 1

## 2021-11-07 MED ORDER — ACETAMINOPHEN 325 MG PO TABS
650.0000 mg | ORAL_TABLET | Freq: Four times a day (QID) | ORAL | Status: DC | PRN
  Administered 2021-11-08 – 2021-11-10 (×2): 650 mg via ORAL
  Filled 2021-11-07 (×3): qty 2

## 2021-11-07 MED ORDER — LACTATED RINGERS IV SOLN: INTRAVENOUS | Status: DC

## 2021-11-07 MED ORDER — CHLORHEXIDINE GLUCONATE CLOTH 2 % EX PADS
6.0000 | MEDICATED_PAD | Freq: Every day | CUTANEOUS | Status: DC
  Administered 2021-11-08 – 2021-11-10 (×3): 6 via TOPICAL

## 2021-11-07 MED ORDER — NITROGLYCERIN 0.4 MG SL SUBL
0.4000 mg | SUBLINGUAL_TABLET | SUBLINGUAL | Status: DC | PRN
Start: 2021-11-07 — End: 2021-11-10

## 2021-11-07 MED ORDER — IPRATROPIUM-ALBUTEROL 0.5-2.5 (3) MG/3ML IN SOLN: 3.0000 mL | Freq: Four times a day (QID) | RESPIRATORY_TRACT | Status: DC

## 2021-11-07 MED ORDER — SODIUM CHLORIDE 0.9 % IV SOLN: INTRAVENOUS | Status: DC

## 2021-11-07 MED ORDER — METRONIDAZOLE 500 MG/100ML IV SOLN
500.0000 mg | Freq: Two times a day (BID) | INTRAVENOUS | Status: DC
  Administered 2021-11-07 – 2021-11-09 (×5): 500 mg via INTRAVENOUS
  Filled 2021-11-07 (×7): qty 100

## 2021-11-07 MED ORDER — SOTALOL HCL 80 MG PO TABS
80.0000 mg | ORAL_TABLET | Freq: Two times a day (BID) | ORAL | Status: DC
  Filled 2021-11-07: qty 1

## 2021-11-07 MED ORDER — VANCOMYCIN HCL IN DEXTROSE 1-5 GM/200ML-% IV SOLN: 1000.0000 mg | Freq: Once | INTRAVENOUS | Status: DC

## 2021-11-07 MED ORDER — ONDANSETRON HCL 4 MG/2ML IJ SOLN
4.0000 mg | Freq: Four times a day (QID) | INTRAMUSCULAR | Status: DC | PRN
  Administered 2021-11-08: 4 mg via INTRAVENOUS
  Filled 2021-11-07: qty 2

## 2021-11-07 MED ORDER — ATORVASTATIN CALCIUM 20 MG PO TABS
40.0000 mg | ORAL_TABLET | Freq: Every day | ORAL | Status: DC
  Administered 2021-11-07 – 2021-11-09 (×3): 40 mg via ORAL
  Filled 2021-11-07 (×3): qty 2

## 2021-11-07 MED ORDER — SODIUM CHLORIDE 0.9% IV SOLUTION: Freq: Once | INTRAVENOUS | Status: DC

## 2021-11-07 MED ORDER — FAMOTIDINE 20 MG PO TABS
20.0000 mg | ORAL_TABLET | Freq: Every day | ORAL | Status: DC
  Administered 2021-11-07 – 2021-11-09 (×3): 20 mg via ORAL
  Filled 2021-11-07 (×3): qty 1

## 2021-11-07 MED ORDER — SODIUM CHLORIDE 0.9 % IV SOLN: INTRAVENOUS | Status: DC | PRN

## 2021-11-07 NOTE — Progress Notes (Signed)
CCMD notified this RN of intermittent burst of AF RVR, rate up to 181. Patient alert, oriented, BP stable, o2 stable. She complains of "palpitations".  MD Sherral Hammers notified of these findings, new orders to restart home Sotalol.

## 2021-11-07 NOTE — Consult Note (Signed)
Pharmacy Antibiotic Note  Stacey Stone is Stone 76 y.o. female admitted on 11/06/2021 with  febrile neutropenia .  Pharmacy has been consulted for cefepime and vancomycin dosing.  8/18 Vancomycin '1500mg'$  IV x1 I ED  Plan: Give Cefepime 2g IV every 12 hours Vancomycin '1250mg'$  IV every 24 hours Goal AUC 400-550  Est AUC: 519.6 Est Cmax: 35.8 Est Cmin: 13.2 Calculated with SCr 0.8   Height: '5\' 2"'$  (157.5 cm) Weight: 77.6 kg (171 lb 1.2 oz) IBW/kg (Calculated) : 50.1  Temp (24hrs), Avg:100.2 F (37.9 C), Min:98 F (36.7 C), Max:102.2 F (39 C)  Recent Labs  Lab 11/06/21 2033 11/07/21 0405 11/07/21 0930  WBC 0.1* 0.1* 0.1*  CREATININE 0.52 0.38* 0.34*  LATICACIDVEN 0.9  --   --      Estimated Creatinine Clearance: 56.8 mL/min (Stone) (by C-G formula based on SCr of 0.34 mg/dL (L)).    Allergies  Allergen Reactions   Codeine Other (See Comments)    Stroke like symptoms   Tramadol Itching    Antimicrobials this admission: 8/18 Cefepime >>  8/18 Vancomycin >>    Microbiology results: 8/18 BCx: sent 8/18 MRSA PCR: sent  Thank you for allowing pharmacy to be Stone part of this patient's care.  Stacey Stone Stacey Stone 11/07/2021 10:30 AM

## 2021-11-07 NOTE — Progress Notes (Signed)
PROGRESS NOTE    Stacey Stone  NLZ:[id number] DOB: Feb 21, 1946 DOA: 11/06/2021 PCP: Rusty Aus, MD   Brief Narrative:  76 y.o. 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  Patient was found to have a fever 102.2 in the ER.  No shortness of breath, no cough no other respiratory symptoms.  No other source of fever found.  Patient was found to be pancytopenic.  She is neutropenic with platelets of 18 also.  At this point she is being admitted with what appears to be febrile neutropenia and COVID-19 infection.   Subjective: 8/19 Tmax overnight 39 C.  This is patient's third round of chemotherapy and per family and patient after each treatment patient becomes ill the next week and is in the hospital.   Assessment & Plan:  Covid vaccination;  Principal Problem:   Febrile neutropenia ([place]) Active Problems:   Sepsis ([place])   COVID-19 virus infection   DLBCL (diffuse large B cell lymphoma) ([place])   Pancytopenia, chemotherapy induced([facility])   Coronary artery disease   Essential hypertension   Sepsis due to COVID-19 [facility])   Hypokalemia  B-cell lymphoma:  -August 10 was patient's third round of chemotherapy - 8/19 contact Dr. [facility] in the a.m. and inform him that his patient has been hospitalized.  Febrile Neutropenia:  -initiated on IV broad-spectrum antibiotics including vancomycin, cefepime and Flagyl x7 days.  -Monitor cultures closely -Obtain lactic acid/procalcitonin to adjust medication -8/19 Granix 300 mcg x 1   Sepsis - Multifactorial to include COVID 19 infection, neutropenic  fever - Treat underlying causes  -   Sepsis due to COVID-19 infection -Long-haul infection vs reinfection.  Understands not a candidate for any COVID antiviral treatment given her ongoing chemotherapy. -Positive feeling of of SOB however patient also severely anemic. - Continuous pulse ox -Incentive spirometry - Flutter valve - DuoNeb QID - 8/19 PCXR pending for a.m. decreased breath sounds left lung fields - Monitor inflammatory markers daily - Consult infectious disease in A.m. given her  immune compromise should we count the 21 days from this positive COVID test on 8/18 or her positive COVID test from 7/5?     COVID-19 Labs  Recent Labs    11/07/21 0930  DDIMER 0.82*  FERRITIN 664*  LDH 90*    Lab Results  Component Value Date   SARSCOV2NAA POSITIVE (A) 11/06/2021   SARSCOV2NAA POSITIVE (A) 09/23/2021   [place] NEGATIVE 05/24/2019      Pancytopenia:  -Secondary to chemotherapy.   Anemia of chemotherapy - 8/19 transfuse for hemoglobin<8 - 8/19 transfuse 2 units PRBC -8/19 occult blood pending  Thrombocytopenia of chemotherapy - 8/19 transfuse platelets for platelets<25 or active bleeding - 8/19 transfuse 1 unit platelets   A-fib with RVR - 8/19 sotalol initially held secondary to patient's borderline BP - 8/19 patient now having runs of A-fib with RVR into the 170s restart sotalol 80 mg daily (renally dosed per pharmacy).   -If still uncontrolled start Cardizem drip (patient on Cardizem 30 mg BID at home)  -Eliquis held secondary to patient's high risk for bleeding secondary to severe thrombocytopenia  Essential HTN -  BP currently controlled - See A-fib RVR  CAD - Lipitor 40 mg daily - Lipid panel pending  DM type II without complication - Sensitive SSI  Hypokalemia -Potassium goal> 4 -8/19 K-Dur 40 mEq x 2 doses  Hypomagnesmia - Magnesium goal> 2 - 8/19 Magnesium IV 2 g  Hyponatremia - Most likely secondary to her above illness, monitor  closely. - Normal saline 31m/hr - Strict in and out - Daily weight  OSA on CPAP - CPAP per respiratory   Obesity (BMI 31.29 kg/m.) -   Mobility Assessment (last 72 hours)     Mobility Assessment     Row Name 11/07/21 0300           Does patient have an order for bedrest or is patient medically unstable No - Continue assessment       What is the highest level of mobility based on the progressive mobility assessment? Level 5 (Walks with assist in room/hall) - Balance while stepping forward/back and can walk in room with assist - Complete                DVT prophylaxis: Held secondary to severe thrombocytopenia Code Status: Full Family Communication: 8/19 daughter and brother at bedside for discussion of plan of care all questions answered.  NOTE daughter will be POC for family. Status is: Inpatient    Dispo: The patient is from: Home              Anticipated d/c is to: Home              Anticipated d/c date is: > 3 days              Patient currently is not medically stable to d/c.      Consultants:    Procedures/Significant Events:     I have personally reviewed and interpreted all radiology studies and my findings are as above.  VENTILATOR SETTINGS:    Cultures 8/18 blood NGTD 8/18 positive SARS coronavirus 8/18 influenza A/B negative 8/19 respiratory virus panel pending 8/19 MRSA by PCR negative    Antimicrobials: Anti-infectives (From admission, onward)    Start     Dose/Rate Route Frequency Ordered Stop   11/07/21 2100  vancomycin (VANCOREADY) IVPB 1250 mg/250 mL        1,250 mg 166.7 mL/hr over 90 Minutes Intravenous Every 24 hours 11/06/21 2207     11/07/21 0900  metroNIDAZOLE (FLAGYL) IVPB 500 mg        500 mg 100 mL/hr over 60 Minutes Intravenous Every 12 hours 11/07/21 0140 11/14/21 0859   11/07/21 0900  ceFEPIme (MAXIPIME) 2 g in sodium chloride 0.9 % 100 mL IVPB        2 g 200 mL/hr over 30 Minutes Intravenous Every 12 hours  11/06/21 2207     11/07/21 0145  ceFEPIme (MAXIPIME) 2 g in sodium chloride 0.9 % 100 mL IVPB  Status:  Discontinued        2 g 200 mL/hr over 30 Minutes Intravenous  Once 11/07/21 0140 11/07/21 0144   11/07/21 0145  vancomycin (VANCOCIN) IVPB 1000 mg/200 mL premix  Status:  Discontinued        1,000 mg 200 mL/hr over 60 Minutes Intravenous  Once 11/07/21 0140 11/07/21 0146   11/06/21 2030  vancomycin (VANCOREADY) IVPB 1500 mg/300 mL        1,500 mg 150 mL/hr over 120 Minutes Intravenous  Once 11/06/21 2020 11/07/21 0000   11/06/21 2015  ceFEPIme (  MAXIPIME) 2 g in sodium chloride 0.9 % 100 mL IVPB        2 g 200 mL/hr over 30 Minutes Intravenous  Once 11/06/21 2010 11/06/21 2151   11/06/21 2015  metroNIDAZOLE (FLAGYL) IVPB 500 mg        500 mg 100 mL/hr over 60 Minutes Intravenous  Once 11/06/21 2010 11/06/21 2206   11/06/21 2015  vancomycin (VANCOCIN) IVPB 1000 mg/200 mL premix  Status:  Discontinued        1,000 mg 200 mL/hr over 60 Minutes Intravenous  Once 11/06/21 2010 11/06/21 2020         Devices    LINES / TUBES:      Continuous Infusions:  ceFEPime (MAXIPIME) IV     lactated ringers 150 mL/hr at 11/06/21 2046   lactated ringers 100 mL/hr at 11/07/21 0258   metronidazole     vancomycin       Objective: Vitals:   11/07/21 0100 11/07/21 0200 11/07/21 0300 11/07/21 0700  BP: 130/72 135/70  125/68  Pulse: 94 95  73  Resp: (!) 22 (!) 22    Temp:      TempSrc:      SpO2: 95% 95%  96%  Weight:   77.6 kg   Height:   '5\' 2"'$  (1.575 m)     Intake/Output Summary (Last 24 hours) at 11/07/2021 0744 Last data filed at 11/07/2021 0300 Gross per 24 hour  Intake 1575.75 ml  Output --  Net 1575.75 ml   Filed Weights   11/06/21 1956 11/07/21 0300  Weight: 75.3 kg 77.6 kg    Examination:  General: A/O x4, No acute respiratory distress Eyes: negative scleral hemorrhage, negative anisocoria, negative icterus ENT: Negative Runny nose, negative gingival  bleeding, Neck:  Negative scars, masses, torticollis, lymphadenopathy, JVD Lungs: Clear to auscultation right lung fields, decreased breath sounds all left lung fields, without wheezes or crackles Cardiovascular: Irregularly irregular rhythm and rate, without murmur gallop or rub normal S1 and S2 Abdomen: negative abdominal pain, nondistended, positive soft, bowel sounds, no rebound, no ascites, no appreciable mass Extremities: No significant cyanosis, clubbing, or edema bilateral lower extremities Skin: Negative rashes, lesions, ulcers Psychiatric:  Negative depression, negative anxiety, negative fatigue, negative mania  Central nervous system:  Cranial nerves II through XII intact, tongue/uvula midline, all extremities muscle strength 5/5, sensation intact throughout, negative dysarthria, negative expressive aphasia, negative receptive aphasia.  .     Data Reviewed: Care during the described time interval was provided by me .  I have reviewed this patient's available data, including medical history, events of note, physical examination, and all test results as part of my evaluation.   CBC: Recent Labs  Lab 11/06/21 2033 11/07/21 0405  WBC 0.1* 0.1*  NEUTROABS 0.0*  --   HGB 7.6* 6.7*  HCT 23.9* 21.1*  MCV 81.8 81.8  PLT 18* 14*   Basic Metabolic Panel: Recent Labs  Lab 11/06/21 2033 11/07/21 0405  NA 134* 133*  K 3.4* 3.1*  CL 102 105  CO2 23 21*  GLUCOSE 118* 91  BUN 9 8  CREATININE 0.52 0.38*  CALCIUM 8.7* 8.1*   GFR: Estimated Creatinine Clearance: 56.8 mL/min (A) (by C-G formula based on SCr of 0.38 mg/dL (L)). Liver Function Tests: Recent Labs  Lab 11/06/21 2033 11/07/21 0405  AST 23 25  ALT 40 36  ALKPHOS 112 99  BILITOT 1.2 1.1  PROT 5.8* 5.2*  ALBUMIN 3.0* 2.6*   No results for input(s): "LIPASE", "  AMYLASE" in the last 168 hours. No results for input(s): "AMMONIA" in the last 168 hours. Coagulation Profile: Recent Labs  Lab 11/06/21 2033  11/07/21 0405  INR 1.2 1.1   Cardiac Enzymes: No results for input(s): "CKTOTAL", "CKMB", "CKMBINDEX", "TROPONINI" in the last 168 hours. BNP (last 3 results) No results for input(s): "PROBNP" in the last 8760 hours. HbA1C: No results for input(s): "HGBA1C" in the last 72 hours. CBG: No results for input(s): "GLUCAP" in the last 168 hours. Lipid Profile: No results for input(s): "CHOL", "HDL", "LDLCALC", "TRIG", "CHOLHDL", "LDLDIRECT" in the last 72 hours. Thyroid Function Tests: No results for input(s): "TSH", "T4TOTAL", "FREET4", "T3FREE", "THYROIDAB" in the last 72 hours. Anemia Panel: No results for input(s): "VITAMINB12", "FOLATE", "FERRITIN", "TIBC", "IRON", "RETICCTPCT" in the last 72 hours. Urine analysis:    Component Value Date/Time   COLORURINE YELLOW (A) 11/06/2021 2101   APPEARANCEUR HAZY (A) 11/06/2021 2101   LABSPEC 1.012 11/06/2021 2101   PHURINE 8.0 11/06/2021 2101   GLUCOSEU NEGATIVE 11/06/2021 2101   HGBUR NEGATIVE 11/06/2021 2101   BILIRUBINUR NEGATIVE 11/06/2021 2101   KETONESUR NEGATIVE 11/06/2021 2101   PROTEINUR NEGATIVE 11/06/2021 2101   NITRITE NEGATIVE 11/06/2021 2101   LEUKOCYTESUR NEGATIVE 11/06/2021 2101   Sepsis Labs: '@LABRCNTIP'$ (procalcitonin:4,lacticidven:4)  ) Recent Results (from the past 240 hour(s))  Culture, blood (Routine x 2)     Status: None (Preliminary result)   Collection Time: 11/06/21  8:33 PM   Specimen: BLOOD  Result Value Ref Range Status   Specimen Description BLOOD RIGHT ANTECUBITAL  Final   Special Requests   Final    BOTTLES DRAWN AEROBIC AND ANAEROBIC Blood Culture adequate volume   Culture   Final    NO GROWTH < 12 HOURS Performed at [facility], [address] [place], [place] [address]    Report Status PENDING  Incomplete  Resp Panel by RT-PCR (Flu A&B, Covid) Anterior Nasal Swab     Status: Abnormal   Collection Time: 11/06/21  8:48 PM   Specimen: Anterior Nasal Swab  Result Value Ref Range  Status   SARS Coronavirus 2 by RT PCR POSITIVE (A) NEGATIVE Final    Comment: (NOTE) SARS-CoV-2 target nucleic acids are DETECTED.  The SARS-CoV-2 RNA is generally detectable in upper respiratory specimens during the acute phase of infection. Positive results are indicative of the presence of the identified virus, but do not rule out bacterial infection or co-infection with other pathogens not detected by the test. Clinical correlation with patient history and other diagnostic information is necessary to determine patient infection status. The expected result is Negative.  Fact Sheet for Patients: EntrepreneurPulse.com.au  Fact Sheet for Healthcare Providers: IncredibleEmployment.be  This test is not yet approved or cleared by the [place] FDA and  has been authorized for detection and/or diagnosis of SARS-CoV-2 by FDA under an Emergency Use Authorization (EUA).  This EUA will remain in effect (meaning this test can be used) for the duration of  the COVID-19 declaration under Section 564(b)(1) of the A ct, 21 U.S.C. section 360bbb-3(b)(1), unless the authorization is terminated or revoked sooner.     Influenza A by PCR NEGATIVE NEGATIVE Final   Influenza B by PCR NEGATIVE NEGATIVE Final    Comment: (NOTE) The Xpert Xpress SARS-CoV-2/FLU/RSV plus assay is intended as an aid in the diagnosis of influenza from Nasopharyngeal swab specimens and should not be used as a sole basis for treatment. Nasal washings and aspirates are unacceptable for Xpert Xpress SARS-CoV-2/FLU/RSV testing.  Fact Sheet for Patients: EntrepreneurPulse.com.au  Fact Sheet for Healthcare Providers: IncredibleEmployment.be  This test is not yet approved or cleared by the [place] FDA and has been authorized for detection and/or diagnosis of SARS-CoV-2 by FDA under an Emergency Use Authorization (EUA). This EUA will remain in  effect (meaning this test can be used) for the duration of the COVID-19 declaration under Section 564(b)(1) of the Act, 21 U.S.C. section 360bbb-3(b)(1), unless the authorization is terminated or revoked.  Performed at [facility], [address] [place], [place] [address]   MRSA Next Gen by PCR, Nasal     Status: None   Collection Time: 11/07/21  3:25 AM   Specimen: Nasal Mucosa; Nasal Swab  Result Value Ref Range Status   MRSA by PCR Next Gen NOT DETECTED NOT DETECTED Final    Comment: (NOTE) The GeneXpert MRSA Assay (FDA approved for NASAL specimens only), is one component of a comprehensive MRSA colonization surveillance program. It is not intended to diagnose MRSA infection nor to guide or monitor treatment for MRSA infections. Test performance is not FDA approved in patients less than 5 years old. Performed at [facility], [address] [place], [place] [address]   Culture, blood (Routine X 2) w Reflex to ID Panel     Status: None (Preliminary result)   Collection Time: 11/07/21  4:05 AM   Specimen: BLOOD  Result Value Ref Range Status   Specimen Description BLOOD RIGHT HAND  Final   Special Requests   Final    BOTTLES DRAWN AEROBIC AND ANAEROBIC Blood Culture adequate volume   Culture   Final    NO GROWTH <12 HOURS Performed at [facility], [address] [place], [place] [address]    Report Status PENDING  Incomplete         Radiology Studies: DG Chest Port 1 View  Result Date: 11/06/2021 CLINICAL DATA:  Questionable sepsis - evaluate for abnormality EXAM: PORTABLE CHEST 1 VIEW COMPARISON:  Chest x-ray 09/23/2021, CT chest 11/16/2016 FINDINGS: Accessed right Port-A-Cath with tip overlying the distal superior vena cava. The heart and mediastinal contours are unchanged. Atherosclerotic plaque. No focal consolidation. No pulmonary edema. No pleural effusion. No pneumothorax. No acute osseous abnormality. IMPRESSION: 1. No active  disease. 2. Aortic Atherosclerosis (ICD10-I70.0). Electronically Signed   By: Iven Finn M.D.   On: 11/06/2021 20:59        Scheduled Meds:  potassium chloride  40 mEq Oral Q4H   Continuous Infusions:  ceFEPime (MAXIPIME) IV     lactated ringers 150 mL/hr at 11/06/21 2046   lactated ringers 100 mL/hr at 11/07/21 0258   metronidazole     vancomycin       LOS: 1 day   The patient is critically ill with multiple organ systems failure and requires high complexity decision making for assessment and support, frequent evaluation and titration of therapies, application of advanced monitoring technologies and extensive interpretation of multiple databases. Critical Care Time devoted to patient care services described in this note  Time spent: 40 minutes     Shatoria Stooksbury, Geraldo Docker, MD [facility]   If 7PM-7AM, please contact night-coverage 11/07/2021, 7:44 AM

## 2021-11-08 ENCOUNTER — Inpatient Hospital Stay: Payer: No Typology Code available for payment source

## 2021-11-08 DIAGNOSIS — U071 COVID-19: Secondary | ICD-10-CM | POA: Diagnosis not present

## 2021-11-08 DIAGNOSIS — I2510 Atherosclerotic heart disease of native coronary artery without angina pectoris: Secondary | ICD-10-CM | POA: Diagnosis not present

## 2021-11-08 DIAGNOSIS — D709 Neutropenia, unspecified: Secondary | ICD-10-CM | POA: Diagnosis not present

## 2021-11-08 DIAGNOSIS — L98419 Non-pressure chronic ulcer of buttock with unspecified severity: Secondary | ICD-10-CM | POA: Diagnosis present

## 2021-11-08 DIAGNOSIS — R7989 Other specified abnormal findings of blood chemistry: Secondary | ICD-10-CM | POA: Diagnosis present

## 2021-11-08 DIAGNOSIS — E119 Type 2 diabetes mellitus without complications: Secondary | ICD-10-CM | POA: Diagnosis not present

## 2021-11-08 LAB — CBC WITH DIFFERENTIAL/PLATELET
Abs Immature Granulocytes: 0 10*3/uL (ref 0.00–0.07)
Basophils Absolute: 0 10*3/uL (ref 0.0–0.1)
Basophils Relative: 7 %
Eosinophils Absolute: 0 10*3/uL (ref 0.0–0.5)
Eosinophils Relative: 0 %
HCT: 26.8 % — ABNORMAL LOW (ref 36.0–46.0)
Hemoglobin: 9 g/dL — ABNORMAL LOW (ref 12.0–15.0)
Immature Granulocytes: 0 %
Lymphocytes Relative: 64 %
Lymphs Abs: 0.1 10*3/uL — ABNORMAL LOW (ref 0.7–4.0)
MCH: 27.2 pg (ref 26.0–34.0)
MCHC: 33.6 g/dL (ref 30.0–36.0)
MCV: 81 fL (ref 80.0–100.0)
Monocytes Absolute: 0 10*3/uL — ABNORMAL LOW (ref 0.1–1.0)
Monocytes Relative: 7 %
Neutro Abs: 0 10*3/uL — CL (ref 1.7–7.7)
Neutrophils Relative %: 22 %
Platelets: 8 10*3/uL — CL (ref 150–400)
RBC: 3.31 MIL/uL — ABNORMAL LOW (ref 3.87–5.11)
RDW: 20.2 % — ABNORMAL HIGH (ref 11.5–15.5)
Smear Review: DECREASED
WBC: 0.1 10*3/uL — CL (ref 4.0–10.5)
nRBC: 0 % (ref 0.0–0.2)

## 2021-11-08 LAB — COMPREHENSIVE METABOLIC PANEL
ALT: 39 U/L (ref 0–44)
AST: 28 U/L (ref 15–41)
Albumin: 2.5 g/dL — ABNORMAL LOW (ref 3.5–5.0)
Alkaline Phosphatase: 108 U/L (ref 38–126)
Anion gap: 4 — ABNORMAL LOW (ref 5–15)
BUN: 7 mg/dL — ABNORMAL LOW (ref 8–23)
CO2: 23 mmol/L (ref 22–32)
Calcium: 7.9 mg/dL — ABNORMAL LOW (ref 8.9–10.3)
Chloride: 110 mmol/L (ref 98–111)
Creatinine, Ser: 0.44 mg/dL (ref 0.44–1.00)
GFR, Estimated: >60 mL/min (ref >60)
Glucose, Bld: 98 mg/dL (ref 70–99)
Potassium: 3.2 mmol/L — ABNORMAL LOW (ref 3.5–5.1)
Sodium: 137 mmol/L (ref 135–145)
Total Bilirubin: 1.3 mg/dL — ABNORMAL HIGH (ref 0.3–1.2)
Total Protein: 5.2 g/dL — ABNORMAL LOW (ref 6.5–8.1)

## 2021-11-08 LAB — RESPIRATORY PANEL BY PCR

## 2021-11-08 LAB — LIPID PANEL
Cholesterol: 111 mg/dL (ref 0–200)
HDL: 38 mg/dL — ABNORMAL LOW (ref >40)
LDL Cholesterol: 60 mg/dL (ref 0–99)
Total CHOL/HDL Ratio: 2.9 RATIO
Triglycerides: 65 mg/dL (ref <150)
VLDL: 13 mg/dL (ref 0–40)

## 2021-11-08 LAB — HEMOGLOBIN A1C
Hgb A1c MFr Bld: 5.6 % (ref 4.8–5.6)
Mean Plasma Glucose: 114.02 mg/dL

## 2021-11-08 LAB — FERRITIN: Ferritin: 592 ng/mL — ABNORMAL HIGH (ref 11–307)

## 2021-11-08 LAB — C-REACTIVE PROTEIN: CRP: 16.7 mg/dL — ABNORMAL HIGH (ref <1.0)

## 2021-11-08 LAB — LACTATE DEHYDROGENASE: LDH: 116 U/L (ref 98–192)

## 2021-11-08 LAB — PHOSPHORUS: Phosphorus: 2.7 mg/dL (ref 2.5–4.6)

## 2021-11-08 LAB — TSH: TSH: 8.043 u[IU]/mL — ABNORMAL HIGH (ref 0.350–4.500)

## 2021-11-08 LAB — D-DIMER, QUANTITATIVE: D-Dimer, Quant: 1.43 ug/mL-FEU — ABNORMAL HIGH (ref 0.00–0.50)

## 2021-11-08 LAB — MAGNESIUM: Magnesium: 2.2 mg/dL (ref 1.7–2.4)

## 2021-11-08 LAB — PROCALCITONIN: Procalcitonin: <0.1 ng/mL

## 2021-11-08 MED ORDER — TBO-FILGRASTIM 480 MCG/0.8ML ~~LOC~~ SOSY: 480.0000 ug | PREFILLED_SYRINGE | Freq: Once | SUBCUTANEOUS | Status: DC

## 2021-11-08 MED ORDER — LEVOTHYROXINE SODIUM 25 MCG PO TABS
125.0000 ug | ORAL_TABLET | Freq: Every day | ORAL | Status: DC
  Administered 2021-11-09 – 2021-11-10 (×2): 125 ug via ORAL
  Filled 2021-11-08 (×2): qty 1

## 2021-11-08 MED ORDER — SODIUM CHLORIDE 0.9% IV SOLUTION: Freq: Once | INTRAVENOUS | Status: AC

## 2021-11-08 MED ORDER — POTASSIUM CHLORIDE CRYS ER 20 MEQ PO TBCR
40.0000 meq | EXTENDED_RELEASE_TABLET | Freq: Two times a day (BID) | ORAL | Status: DC
  Administered 2021-11-08 (×2): 40 meq via ORAL
  Filled 2021-11-08 (×2): qty 2

## 2021-11-08 NOTE — Plan of Care (Signed)
  Problem: Clinical Measurements: Goal: Diagnostic test results will improve Outcome: Progressing   Problem: Clinical Measurements: Goal: Signs and symptoms of infection will decrease Outcome: Progressing   Problem: Respiratory: Goal: Ability to maintain adequate ventilation will improve Outcome: Progressing   

## 2021-11-08 NOTE — Progress Notes (Signed)
PROGRESS NOTE    Stacey Stone  YIR:[id number] DOB: 1946/03/22 DOA: 11/06/2021 PCP: Rusty Aus, MD   Brief Narrative:  76 y.o. 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  Patient was found to have a fever 102.2 in the ER.  No shortness of breath, no cough no other respiratory symptoms.  No other source of fever found.  Patient was found to be pancytopenic.  She is neutropenic with platelets of 18 also.  At this point she is being admitted with what appears to be febrile neutropenia and COVID-19 infection.   Subjective: 8/20 afebrile overnight A/O x4, feels much better.   Assessment & Plan:  Covid vaccination;  Principal Problem:   Febrile neutropenia ([place]) Active Problems:   Sepsis ([place])   COVID-19 virus infection   DLBCL (diffuse large B cell lymphoma) ([place])   Pancytopenia, chemotherapy induced(HCC)   Coronary artery disease   Essential hypertension   Hypothyroidism   OSA on CPAP   Paroxysmal atrial fibrillation (HCC)   Primary cancer of upper outer quadrant of left female breast ([place])   Diabetes mellitus without complication ([place])   Sepsis due to COVID-19 [facility])   Hypokalemia   Thrombocytopenia due to drugs   Pancytopenia due to chemotherapy (HCC)   Elevated d-dimer   Ulcer of RIGHT buttock ([place])  B-cell lymphoma:  -August 10 was patient's third round of chemotherapy - 8/19 contact Dr. Delight [facility] in the a.m. and inform him that his patient has been hospitalized.  Febrile Neutropenia:  -initiated on IV broad-spectrum antibiotics including vancomycin, cefepime and Flagyl  x7 days.  -Monitor cultures closely -Obtain lactic acid/procalcitonin to adjust medication -8/19 Granix 300 mcg x 1 -8/20 discussed case with NP Beckey [facility] who stated patient had received dose of Neupogen with her last chemo dose.  Hold on additional Granix dose at this time. -8/20 NP Beckey [facility] concurred with all other treatment regimens.   Sepsis - Multifactorial to include COVID 19 infection, neutropenic fever - Treat underlying causes    Sepsis due to COVID-19 infection -Long-haul infection vs reinfection.  Understands not a candidate for any COVID antiviral treatment given her ongoing chemotherapy. -Positive feeling of of SOB however patient also severely anemic. - Continuous pulse ox -Incentive spirometry - Flutter valve - DuoNeb QID - 8/19 PCXR pending for a.m. decreased breath sounds left lung fields - Monitor inflammatory markers daily - 8/20 discussed case with Dr. Jule Ser, ID given her  immune compromise, she will be considered contagious for 21 days starting from 8/18.  Therefore patient would be considered contagious until 9/8   COVID-19 Labs  Recent Labs    11/07/21 0930 11/08/21 0605  DDIMER 0.82* 1.43*  FERRITIN 664* 592*  LDH 90* 116  CRP 15.1* 16.7*    Lab Results  Component Value Date   SARSCOV2NAA POSITIVE (A) 11/06/2021   SARSCOV2NAA POSITIVE (A) 09/23/2021   [place] NEGATIVE 05/24/2019   Elevated D-dimer - Could be concerning for DVT or PE but currently asymptomatic especially given patient's A-fib..  Monitor daily   Pancytopenia:  -Secondary to chemotherapy.   Anemia of chemotherapy -  8/19 transfuse for hemoglobin<7 - 8/19 transfuse 2 units PRBC -8/19 occult blood pending Lab Results  Component Value Date   HGB 9.0 (L) 11/08/2021   HGB 6.5 (L) 11/07/2021   HGB 6.7 (L) 11/07/2021   HGB 7.6 (L) 11/06/2021   HGB 11.1 (L) 10/29/2021  -8/20 at goal  Thrombocytopenia of chemotherapy - 8/19 transfuse  platelets for platelets<20 or active bleeding - 8/19 transfuse 1 unit platelets -8/20 transfuse 2 units platelets  A-fib with RVR - 8/19 sotalol initially held secondary to patient's borderline BP - 8/19 patient now having runs of A-fib with RVR into the 170s restart sotalol 80 mg daily (renally dosed per pharmacy).   -If still uncontrolled start Cardizem drip (patient on Cardizem 30 mg BID at home)  -Eliquis held secondary to patient's high risk for bleeding secondary to severe thrombocytopenia  Essential HTN - BP currently controlled - See A-fib RVR  CAD - Lipitor 40 mg daily - Lipid panel pending  DM type II without complication - Sensitive SSI  Hypothyroidism - 8/20 Synthroid 125 mcg daily  Hypokalemia -Potassium goal> 4 - 8/20  K-Dur 40 mEq x 2 doses  Hypomagnesmia - Magnesium goal> 2 - 8/19 Magnesium IV 2 g  Hyponatremia - Most likely secondary to her above illness, monitor closely. - Normal saline 4m/hr - Strict in and out - Daily weight  OSA on CPAP - CPAP per respiratory   Obesity (BMI 31.29 kg/m.) - Right buttocks Ulcer/Abscess? - Discussed ulcer with RN currently covered with foam padding monitor closely. - 8/20 given patient's immunocompromise state and pancytopenia extremely reluctant to perform any invasive procedure.  If worsens would obtain CT soft tissue to actually ensure it has become abscess.          Mobility Assessment (last 72 hours)     Mobility Assessment     Row Name 11/07/21 0300           Does patient have an order for bedrest or is patient medically unstable No - Continue assessment       What is the highest level of mobility based on the progressive mobility assessment? Level 5 (Walks with assist in room/hall) - Balance while stepping forward/back and can walk in room with assist - Complete                DVT prophylaxis: Held secondary to severe thrombocytopenia Code Status: Full Family Communication: 8/19  daughter and brother at bedside for discussion of plan of care all questions answered.  NOTE daughter will be POC for family. Status is: Inpatient    Dispo: The patient is from: Home              Anticipated d/c is to: Home              Anticipated d/c date is: > 3 days              Patient currently is not medically stable to d/c.      Consultants:  Dr. AJule Ser ID   Procedures/Significant Events:     I have personally reviewed and interpreted all radiology studies and my findings are as above.  VENTILATOR SETTINGS:    Cultures 8/18 blood NGTD 8/18 positive SARS coronavirus 8/18 influenza A/B negative 8/19 respiratory virus panel pending 8/19 MRSA by PCR negative    Antimicrobials: Anti-infectives (From admission, onward)    Start     Dose/Rate Route Frequency Ordered Stop   11/07/21 2100  vancomycin (VANCOREADY)  IVPB 1250 mg/250 mL        1,250 mg 166.7 mL/hr over 90 Minutes Intravenous Every 24 hours 11/06/21 2207     11/07/21 0900  metroNIDAZOLE (FLAGYL) IVPB 500 mg        500 mg 100 mL/hr over 60 Minutes Intravenous Every 12 hours 11/07/21 0140 11/14/21 0859   11/07/21 0900  ceFEPIme (MAXIPIME) 2 g in sodium chloride 0.9 % 100 mL IVPB        2 g 200 mL/hr over 30 Minutes Intravenous Every 12 hours 11/06/21 2207     11/07/21 0145  ceFEPIme (MAXIPIME) 2 g in sodium chloride 0.9 % 100 mL IVPB  Status:  Discontinued        2 g 200 mL/hr over 30 Minutes Intravenous  Once 11/07/21 0140 11/07/21 0144   11/07/21 0145  vancomycin (VANCOCIN) IVPB 1000 mg/200 mL premix  Status:  Discontinued        1,000 mg 200 mL/hr over 60 Minutes Intravenous  Once 11/07/21 0140 11/07/21 0146   11/06/21 2030  vancomycin (VANCOREADY) IVPB 1500 mg/300 mL        1,500 mg 150 mL/hr over 120 Minutes Intravenous  Once 11/06/21 2020 11/07/21 0000   11/06/21 2015  ceFEPIme (MAXIPIME) 2 g in sodium chloride 0.9 % 100 mL IVPB        2 g 200 mL/hr over 30 Minutes Intravenous  Once  11/06/21 2010 11/06/21 2151   11/06/21 2015  metroNIDAZOLE (FLAGYL) IVPB 500 mg        500 mg 100 mL/hr over 60 Minutes Intravenous  Once 11/06/21 2010 11/06/21 2206   11/06/21 2015  vancomycin (VANCOCIN) IVPB 1000 mg/200 mL premix  Status:  Discontinued        1,000 mg 200 mL/hr over 60 Minutes Intravenous  Once 11/06/21 2010 11/06/21 2020         Devices    LINES / TUBES:      Continuous Infusions:  sodium chloride 10 mL/hr at 11/07/21 0912   sodium chloride 75 mL/hr at 11/08/21 1902   ceFEPime (MAXIPIME) IV Stopped (11/08/21 0914)   metronidazole 100 mL/hr at 11/08/21 1902   vancomycin 1,250 mg (11/07/21 2131)     Objective: Vitals:   11/08/21 1500 11/08/21 1540 11/08/21 1600 11/08/21 1740  BP: 139/77 126/73 135/72 105/77  Pulse: 81 76 74 73  Resp: 17 20 (!) 21 19  Temp: 98.8 F (37.1 C) 98 F (36.7 C) 99.2 F (37.3 C) 98 F (36.7 C)  TempSrc: Oral Oral  Oral  SpO2: 99% 99% 100% 100%  Weight:      Height:        Intake/Output Summary (Last 24 hours) at 11/08/2021 1904 Last data filed at 11/08/2021 1902 Gross per 24 hour  Intake 3794.83 ml  Output 2300 ml  Net 1494.83 ml   Filed Weights   11/06/21 1956 11/07/21 0300  Weight: 75.3 kg 77.6 kg    Examination:  General: A/O x4, No acute respiratory distress Eyes: negative scleral hemorrhage, negative anisocoria, negative icterus ENT: Negative Runny nose, negative gingival bleeding, Neck:  Negative scars, masses, torticollis, lymphadenopathy, JVD Lungs: Clear to auscultation right lung fields, decreased breath sounds all left lung fields, without wheezes or crackles Cardiovascular: Irregularly irregular rhythm and rate, without murmur gallop or rub normal S1 and S2 Abdomen: negative abdominal pain, nondistended, positive soft, bowel sounds, no rebound, no ascites, no appreciable mass Extremities: No significant cyanosis, clubbing, or edema bilateral lower extremities Skin: Negative rashes,  lesions,  ulcers Psychiatric:  Negative depression, negative anxiety, negative fatigue, negative mania  Central nervous system:  Cranial nerves II through XII intact, tongue/uvula midline, all extremities muscle strength 5/5, sensation intact throughout, negative dysarthria, negative expressive aphasia, negative receptive aphasia.  .     Data Reviewed: Care during the described time interval was provided by me .  I have reviewed this patient's available data, including medical history, events of note, physical examination, and all test results as part of my evaluation.   CBC: Recent Labs  Lab 11/06/21 2033 11/07/21 0405 11/07/21 0930 11/08/21 0605  WBC 0.1* 0.1* 0.1* 0.1*  NEUTROABS 0.0*  --  0.0* 0.0*  HGB 7.6* 6.7* 6.5* 9.0*  HCT 23.9* 21.1* 19.5* 26.8*  MCV 81.8 81.8 79.9* 81.0  PLT 18* 14* 12* 8*   Basic Metabolic Panel: Recent Labs  Lab 11/06/21 2033 11/07/21 0405 11/07/21 0930 11/08/21 0605  NA 134* 133* 132* 137  K 3.4* 3.1* 2.9* 3.2*  CL 102 105 103 110  CO2 23 21* 23 23  GLUCOSE 118* 91 87 98  BUN 9 8 7* 7*  CREATININE 0.52 0.38* 0.34* 0.44  CALCIUM 8.7* 8.1* 8.1* 7.9*  MG  --   --  1.9 2.2  PHOS  --   --  2.6 2.7   GFR: Estimated Creatinine Clearance: 56.8 mL/min (by C-G formula based on SCr of 0.44 mg/dL). Liver Function Tests: Recent Labs  Lab 11/06/21 2033 11/07/21 0405 11/07/21 0930 11/08/21 0605  AST '23 25 23 28  '$ ALT 40 36 35 39  ALKPHOS 112 99 95 108  BILITOT 1.2 1.1 1.1 1.3*  PROT 5.8* 5.2* 5.2* 5.2*  ALBUMIN 3.0* 2.6* 2.5* 2.5*   No results for input(s): "LIPASE", "AMYLASE" in the last 168 hours. No results for input(s): "AMMONIA" in the last 168 hours. Coagulation Profile: Recent Labs  Lab 11/06/21 2033 11/07/21 0405  INR 1.2 1.1   Cardiac Enzymes: No results for input(s): "CKTOTAL", "CKMB", "CKMBINDEX", "TROPONINI" in the last 168 hours. BNP (last 3 results) No results for input(s): "PROBNP" in the last 8760 hours. HbA1C: Recent Labs     11/08/21 0605  HGBA1C 5.6   CBG: No results for input(s): "GLUCAP" in the last 168 hours. Lipid Profile: Recent Labs    11/08/21 0605  CHOL 111  HDL 38*  LDLCALC 60  TRIG 65  CHOLHDL 2.9   Thyroid Function Tests: Recent Labs    11/08/21 0605  TSH 8.043*   Anemia Panel: Recent Labs    11/07/21 0930 11/08/21 0605  FERRITIN 664* 592*   Urine analysis:    Component Value Date/Time   COLORURINE YELLOW (A) 11/06/2021 2101   APPEARANCEUR HAZY (A) 11/06/2021 2101   LABSPEC 1.012 11/06/2021 2101   PHURINE 8.0 11/06/2021 2101   GLUCOSEU NEGATIVE 11/06/2021 2101   HGBUR NEGATIVE 11/06/2021 2101   BILIRUBINUR NEGATIVE 11/06/2021 2101   KETONESUR NEGATIVE 11/06/2021 2101   PROTEINUR NEGATIVE 11/06/2021 2101   NITRITE NEGATIVE 11/06/2021 2101   LEUKOCYTESUR NEGATIVE 11/06/2021 2101   Sepsis Labs: '@LABRCNTIP'$ (procalcitonin:4,lacticidven:4)  ) Recent Results (from the past 240 hour(s))  Culture, blood (Routine x 2)     Status: None (Preliminary result)   Collection Time: 11/06/21  8:33 PM   Specimen: BLOOD  Result Value Ref Range Status   Specimen Description BLOOD RIGHT ANTECUBITAL  Final   Special Requests   Final    BOTTLES DRAWN AEROBIC AND ANAEROBIC Blood Culture adequate volume   Culture   Final  NO GROWTH 2 DAYS Performed at [facility], [address] [place], [place] [address]    Report Status PENDING  Incomplete  Resp Panel by RT-PCR (Flu A&B, Covid) Anterior Nasal Swab     Status: Abnormal   Collection Time: 11/06/21  8:48 PM   Specimen: Anterior Nasal Swab  Result Value Ref Range Status   SARS Coronavirus 2 by RT PCR POSITIVE (A) NEGATIVE Final    Comment: (NOTE) SARS-CoV-2 target nucleic acids are DETECTED.  The SARS-CoV-2 RNA is generally detectable in upper respiratory specimens during the acute phase of infection. Positive results are indicative of the presence of the identified virus, but do not rule out bacterial infection or  co-infection with other pathogens not detected by the test. Clinical correlation with patient history and other diagnostic information is necessary to determine patient infection status. The expected result is Negative.  Fact Sheet for Patients: EntrepreneurPulse.com.au  Fact Sheet for Healthcare Providers: IncredibleEmployment.be  This test is not yet approved or cleared by the [place] FDA and  has been authorized for detection and/or diagnosis of SARS-CoV-2 by FDA under an Emergency Use Authorization (EUA).  This EUA will remain in effect (meaning this test can be used) for the duration of  the COVID-19 declaration under Section 564(b)(1) of the A ct, 21 U.S.C. section 360bbb-3(b)(1), unless the authorization is terminated or revoked sooner.     Influenza A by PCR NEGATIVE NEGATIVE Final   Influenza B by PCR NEGATIVE NEGATIVE Final    Comment: (NOTE) The Xpert Xpress SARS-CoV-2/FLU/RSV plus assay is intended as an aid in the diagnosis of influenza from Nasopharyngeal swab specimens and should not be used as a sole basis for treatment. Nasal washings and aspirates are unacceptable for Xpert Xpress SARS-CoV-2/FLU/RSV testing.  Fact Sheet for Patients: EntrepreneurPulse.com.au  Fact Sheet for Healthcare Providers: IncredibleEmployment.be  This test is not yet approved or cleared by the [place] FDA and has been authorized for detection and/or diagnosis of SARS-CoV-2 by FDA under an Emergency Use Authorization (EUA). This EUA will remain in effect (meaning this test can be used) for the duration of the COVID-19 declaration under Section 564(b)(1) of the Act, 21 U.S.C. section 360bbb-3(b)(1), unless the authorization is terminated or revoked.  Performed at [facility], [address] [place], [place] [address]   MRSA Next Gen by PCR, Nasal     Status: None   Collection Time:  11/07/21  3:25 AM   Specimen: Nasal Mucosa; Nasal Swab  Result Value Ref Range Status   MRSA by PCR Next Gen NOT DETECTED NOT DETECTED Final    Comment: (NOTE) The GeneXpert MRSA Assay (FDA approved for NASAL specimens only), is one component of a comprehensive MRSA colonization surveillance program. It is not intended to diagnose MRSA infection nor to guide or monitor treatment for MRSA infections. Test performance is not FDA approved in patients less than 21 years old. Performed at [facility], [address] [place], [place] [address]   Culture, blood (Routine X 2) w Reflex to ID Panel     Status: None (Preliminary result)   Collection Time: 11/07/21  4:05 AM   Specimen: BLOOD  Result Value Ref Range Status   Specimen Description BLOOD RIGHT HAND  Final   Special Requests   Final    BOTTLES DRAWN AEROBIC AND ANAEROBIC Blood Culture adequate volume   Culture   Final    NO GROWTH < 24 HOURS Performed at [facility], [address] [place], [place] [address]    Report Status PENDING  Incomplete  Respiratory (~20 pathogens) panel by PCR     Status: None   Collection Time: 11/07/21  6:55 PM   Specimen: Nasopharyngeal Swab; Respiratory  Result Value Ref Range Status   Adenovirus NOT DETECTED NOT DETECTED Final   Coronavirus 229E NOT DETECTED NOT DETECTED Final    Comment: (NOTE) The Coronavirus on the Respiratory Panel, DOES NOT test for the novel  Coronavirus (2019 nCoV)    Coronavirus HKU1 NOT DETECTED NOT DETECTED Final   Coronavirus NL63 NOT DETECTED NOT DETECTED Final   Coronavirus OC43 NOT DETECTED NOT DETECTED Final   Metapneumovirus NOT DETECTED NOT DETECTED Final   Rhinovirus / Enterovirus NOT DETECTED NOT DETECTED Final   Influenza A NOT DETECTED NOT DETECTED Final   Influenza B NOT DETECTED NOT DETECTED Final   Parainfluenza Virus 1 NOT DETECTED NOT DETECTED Final   Parainfluenza Virus 2 NOT DETECTED NOT DETECTED Final   Parainfluenza Virus  3 NOT DETECTED NOT DETECTED Final   Parainfluenza Virus 4 NOT DETECTED NOT DETECTED Final   Respiratory Syncytial Virus NOT DETECTED NOT DETECTED Final   Bordetella pertussis NOT DETECTED NOT DETECTED Final   Bordetella Parapertussis NOT DETECTED NOT DETECTED Final   Chlamydophila pneumoniae NOT DETECTED NOT DETECTED Final   Mycoplasma pneumoniae NOT DETECTED NOT DETECTED Final    Comment: Performed at [facility], [place]. [address] [place], [place] [address]         Radiology Studies: DG Chest Port 1 View  Result Date: 11/08/2021 CLINICAL DATA:  Follow-up pneumonia. EXAM: PORTABLE CHEST 1 VIEW COMPARISON:  11/06/2021 FINDINGS: There is a right chest wall port a catheter with tip at the cavoatrial junction. Heart size appears normal scratch set stable cardiomediastinal contours. No pleural effusion or edema. Streaky peribronchial opacities within the left base identified which appears stable to slightly increased from previous exam. IMPRESSION: Streaky peribronchial opacities within the left base, stable to slightly increased from previous exam. Electronically Signed   By: Kerby Moors M.D.   On: 11/08/2021 07:09   DG Chest Port 1 View  Result Date: 11/06/2021 CLINICAL DATA:  Questionable sepsis - evaluate for abnormality EXAM: PORTABLE CHEST 1 VIEW COMPARISON:  Chest x-ray 09/23/2021, CT chest 11/16/2016 FINDINGS: Accessed right Port-A-Cath with tip overlying the distal superior vena cava. The heart and mediastinal contours are unchanged. Atherosclerotic plaque. No focal consolidation. No pulmonary edema. No pleural effusion. No pneumothorax. No acute osseous abnormality. IMPRESSION: 1. No active disease. 2. Aortic Atherosclerosis (ICD10-I70.0). Electronically Signed   By: Iven Finn M.D.   On: 11/06/2021 20:59        Scheduled Meds:  sodium chloride   Intravenous Once   sodium chloride   Intravenous Once   atorvastatin  40 mg Oral QHS   Chlorhexidine Gluconate Cloth  6  each Topical Daily   famotidine  20 mg Oral QHS   Ipratropium-Albuterol  1 puff Inhalation QID   [START ON 11/09/2021] levothyroxine  125 mcg Oral Daily   potassium chloride  40 mEq Oral BID   sertraline  50 mg Oral QHS   sotalol  80 mg Oral Q24H   Continuous Infusions:  sodium chloride 10 mL/hr at 11/07/21 0912   sodium chloride 75 mL/hr at 11/08/21 1902   ceFEPime (MAXIPIME) IV Stopped (11/08/21 0914)   metronidazole 100 mL/hr at 11/08/21 1902   vancomycin 1,250 mg (11/07/21 2131)     LOS: 2 days  The patient is critically ill with multiple organ systems failure and requires high complexity decision making for assessment and support, frequent evaluation and titration of therapies, application of advanced monitoring technologies and extensive interpretation of multiple databases. Critical Care Time devoted to patient care services described in this note  Time spent: 40 minutes     Nazarene Bunning, Geraldo Docker, MD [facility]   If 7PM-7AM, please contact night-coverage 11/08/2021, 7:04 PM

## 2021-11-09 ENCOUNTER — Telehealth: Payer: Self-pay | Admitting: Oncology

## 2021-11-09 ENCOUNTER — Ambulatory Visit: Payer: 59 | Admitting: Radiation Oncology

## 2021-11-09 DIAGNOSIS — R5081 Fever presenting with conditions classified elsewhere: Secondary | ICD-10-CM | POA: Diagnosis not present

## 2021-11-09 DIAGNOSIS — D709 Neutropenia, unspecified: Secondary | ICD-10-CM | POA: Diagnosis not present

## 2021-11-09 LAB — BPAM PLATELET PHERESIS
Blood Product Expiration Date: 202308232359
Blood Product Expiration Date: 202308222359
Blood Product Expiration Date: 202308222359
ISSUE DATE / TIME: 202308200321
ISSUE DATE / TIME: 202308201050
ISSUE DATE / TIME: 202308201512
Unit Type and Rh: 6200
Unit Type and Rh: 5100
Unit Type and Rh: 5100

## 2021-11-09 LAB — COMPREHENSIVE METABOLIC PANEL
ALT: 36 U/L (ref 0–44)
AST: 25 U/L (ref 15–41)
Albumin: 2.5 g/dL — ABNORMAL LOW (ref 3.5–5.0)
Alkaline Phosphatase: 120 U/L (ref 38–126)
Anion gap: 4 — ABNORMAL LOW (ref 5–15)
BUN: 5 mg/dL — ABNORMAL LOW (ref 8–23)
CO2: 24 mmol/L (ref 22–32)
Calcium: 8.4 mg/dL — ABNORMAL LOW (ref 8.9–10.3)
Chloride: 106 mmol/L (ref 98–111)
Creatinine, Ser: 0.33 mg/dL — ABNORMAL LOW (ref 0.44–1.00)
GFR, Estimated: >60 mL/min (ref >60)
Glucose, Bld: 94 mg/dL (ref 70–99)
Potassium: 3.7 mmol/L (ref 3.5–5.1)
Sodium: 134 mmol/L — ABNORMAL LOW (ref 135–145)
Total Bilirubin: 1 mg/dL (ref 0.3–1.2)
Total Protein: 5.2 g/dL — ABNORMAL LOW (ref 6.5–8.1)

## 2021-11-09 LAB — PREPARE PLATELET PHERESIS
Unit division: 0
Unit division: 0
Unit division: 0

## 2021-11-09 LAB — BPAM RBC
Blood Product Expiration Date: 202308292359
Blood Product Expiration Date: 202309102359
ISSUE DATE / TIME: 202308192147
ISSUE DATE / TIME: 202308200025
Unit Type and Rh: 5100
Unit Type and Rh: 9500

## 2021-11-09 LAB — TYPE AND SCREEN
ABO/RH(D): O NEG
Antibody Screen: NEGATIVE
Unit division: 0
Unit division: 0

## 2021-11-09 LAB — PROCALCITONIN: Procalcitonin: <0.1 ng/mL

## 2021-11-09 LAB — FERRITIN: Ferritin: 687 ng/mL — ABNORMAL HIGH (ref 11–307)

## 2021-11-09 LAB — C-REACTIVE PROTEIN: CRP: 13.6 mg/dL — ABNORMAL HIGH (ref <1.0)

## 2021-11-09 LAB — MAGNESIUM: Magnesium: 2 mg/dL (ref 1.7–2.4)

## 2021-11-09 LAB — D-DIMER, QUANTITATIVE: D-Dimer, Quant: 1.44 ug/mL-FEU — ABNORMAL HIGH (ref 0.00–0.50)

## 2021-11-09 LAB — LACTATE DEHYDROGENASE: LDH: 110 U/L (ref 98–192)

## 2021-11-09 LAB — PHOSPHORUS: Phosphorus: 2.2 mg/dL — ABNORMAL LOW (ref 2.5–4.6)

## 2021-11-09 MED ORDER — K PHOS MONO-SOD PHOS DI & MONO 155-852-130 MG PO TABS
500.0000 mg | ORAL_TABLET | ORAL | Status: AC
  Administered 2021-11-09 (×3): 500 mg via ORAL
  Filled 2021-11-09 (×3): qty 2

## 2021-11-09 MED ORDER — POTASSIUM CHLORIDE CRYS ER 20 MEQ PO TBCR
20.0000 meq | EXTENDED_RELEASE_TABLET | Freq: Two times a day (BID) | ORAL | Status: DC
  Administered 2021-11-09 – 2021-11-10 (×3): 20 meq via ORAL
  Filled 2021-11-09 (×3): qty 1

## 2021-11-09 MED ORDER — SOTALOL HCL 80 MG PO TABS
80.0000 mg | ORAL_TABLET | Freq: Two times a day (BID) | ORAL | Status: DC
  Administered 2021-11-09 – 2021-11-10 (×2): 80 mg via ORAL
  Filled 2021-11-09 (×2): qty 1

## 2021-11-09 MED ORDER — IPRATROPIUM-ALBUTEROL 20-100 MCG/ACT IN AERS: 1.0000 | INHALATION_SPRAY | Freq: Four times a day (QID) | RESPIRATORY_TRACT | Status: DC | PRN

## 2021-11-09 NOTE — TOC Initial Note (Signed)
Transition of Care [facility]) - Initial/Assessment Note    Patient Details  Name: Stacey Stone MRN: [id number] Date of Birth: 07/06/1945  Transition of Care [facility]) CM/SW Contact:    Candie Chroman, LCSW Phone Number: 11/09/2021, 1:19 PM  Clinical Narrative:    Readmission prevention screen complete. CSW called patient in the room, introduced role, and explained that discharge planning would be discussed. PCP is Emily Filbert, MD. Patient was previously driving herself to appointments but since starting chemo, her daughter or brother transports her. Pharmacy is [facility]. No issues obtaining medications. No home health therapy prior to admission. She does have personal care services through [facility]. On chemo weeks, they come 3 days a week, 5 hours a day. On non-chemo weeks they come 2 days a week, 2 hours a day. Daughter is working on increasing her hours. Patient has three walkers and two canes at home. Her shower has a built-in seat. No further concerns. CSW encouraged patient to contact CSW as needed. CSW will continue to follow patient for support and facilitate return home once stable. Daughter or brother will transport her home at discharge depending on the day.             Expected Discharge Plan: Home/Self Care (with PCS) Barriers to Discharge: Continued Medical Work up   Patient Goals and CMS Choice        Expected Discharge Plan and Services Expected Discharge Plan: Home/Self Care (with PCS)     Post Acute Care Choice: Resumption of Svcs/PTA Provider Living arrangements for the past 2 months: Single Family Home                                      Prior Living Arrangements/Services Living arrangements for the past 2 months: Single Family Home   Patient language and need for interpreter reviewed:: Yes Do you feel safe going back to the place where you live?: Yes      Need for Family Participation in Patient Care: Yes (Comment) Care giver support  system in place?: Yes (comment) Current home services: DME, Other (comment) ([place]) Criminal Activity/Legal Involvement Pertinent to Current Situation/Hospitalization: No - Comment as needed  Activities of Daily Living Home Assistive Devices/Equipment: None ADL Screening (condition at time of admission) Patient's cognitive ability adequate to safely complete daily activities?: Yes Is the patient deaf or have difficulty hearing?: No Does the patient have difficulty seeing, even when wearing glasses/contacts?: No Does the patient have difficulty concentrating, remembering, or making decisions?: No Patient able to express need for assistance with ADLs?: Yes Does the patient have difficulty dressing or bathing?: No Independently performs ADLs?: Yes (appropriate for developmental age) Does the patient have difficulty walking or climbing stairs?: No Weakness of Legs: None Weakness of Arms/Hands: None  Permission Sought/Granted                  Emotional Assessment Appearance:: Appears stated age Attitude/Demeanor/Rapport: Engaged, Gracious Affect (typically observed): Accepting, Appropriate, Calm, Pleasant Orientation: : Oriented to Self, Oriented to Place, Oriented to  Time, Oriented to Situation Alcohol / Substance Use: Not Applicable Psych Involvement: No (comment)  Admission diagnosis:  Neutropenic fever ([place]) [D70.9, R50.81] Sepsis, due to unspecified organism, unspecified whether acute organ dysfunction present ([place]) [A41.9] Sepsis due to COVID-19 ([place]) [U07.1, A41.89] Patient Active Problem List   Diagnosis Date Noted   Elevated d-dimer 11/08/2021  Ulcer of RIGHT buttock ([place]) 11/08/2021   Thrombocytopenia due to drugs 11/07/2021   Pancytopenia due to chemotherapy ([place]) 11/07/2021   Sepsis due to COVID-19 [facility]) 11/06/2021   Hypokalemia 11/06/2021   Bacteremia due to ESBL-producing Escherichia coli source UTI 09/26/2021   Finger nail contusion, initial  encounter 09/25/2021   Bacteremia 09/24/2021   Dehydration 09/23/2021   Febrile neutropenia ([place]) 09/23/2021   Diabetes mellitus without complication ([place])    Pancytopenia, chemotherapy induced(HCC)    Urinary tract infection    COVID-19 virus infection    DLBCL (diffuse large B cell lymphoma) ([place]) 09/10/2021   Peripheral edema 07/13/2019   Sepsis ([place]) 05/24/2019   Cellulitis of right lower extremity 05/24/2019   Bilateral edema of lower extremity 05/24/2019   Abnormal LFTs 06/06/2018   History of breast cancer 06/06/2018   Status post ablation of atrial fibrillation 02/08/2018   PMB (postmenopausal bleeding) 06/17/2016   Preoperative cardiovascular examination 05/13/2016   Primary cancer of upper outer quadrant of left female breast ([place]) 05/09/2016   Unstable angina (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   OSA on CPAP 01/02/2014   Angina at rest [facility]) 06/16/2013   Essential hypertension 06/16/2013   Hypercholesterolemia 06/16/2013   Hypothyroidism 06/16/2013   Rhegmatogenous retinal detachment of left eye 02/16/2011   Coronary artery disease 01/08/2006   PCP:  Rusty Aus, MD Pharmacy:   [place], [place] [address] Phone: [phone] Fax: [phone]     Social Determinants of Health (SDOH) Interventions    Readmission Risk Interventions    11/09/2021    1:17 PM  Readmission Risk Prevention Plan  Transportation Screening Complete  PCP or Specialist Appt within 3-5 Days Complete  HRI or [place] Complete  Social Work Consult for [place] Planning/Counseling Complete  Palliative Care Screening Not  Applicable  Medication Review Press photographer) Complete

## 2021-11-09 NOTE — Consult Note (Signed)
PHARMACY CONSULT NOTE - FOLLOW UP  Pharmacy Consult for Electrolyte Monitoring and Replacement   Recent Labs: Potassium (mmol/L)  Date Value  11/09/2021 3.7  07/03/2014 3.9   Magnesium (mg/dL)  Date Value  11/09/2021 2.0   Calcium (mg/dL)  Date Value  11/09/2021 8.4 (L)   Calcium, Total (mg/dL)  Date Value  07/03/2014 9.0   Albumin (g/dL)  Date Value  11/09/2021 2.5 (L)  07/03/2014 4.0   Phosphorus (mg/dL)  Date Value  11/09/2021 2.2 (L)   Sodium (mmol/L)  Date Value  11/09/2021 134 (L)  07/03/2014 138    Assessment: Patient admitted with neutropenic fever, anemia, and electrolytes disturbances. PMH includes B-cell lymphoma (currently on chemotherapy), recent COVID-19 infection, and A.fib (on sotalol). Pharmacy was consulted to manage electrolytes.  Corrected calcium 9.8 - no replacement needed K 3.7 - improved from 2.9 noted baseline , patient on Kcl 29mq po BID (856m given in last 24 hrs). Phos 2.2 - needs replacement Mag 2.0 - no replacement needed  Goal of Therapy:  K =/> 4, Mg =/>2, all other electrolytes WNL  Plan:  Decrease Kcl po from 4029mto 61m61mID Add Kphos neutral '500mg'$  q 4hrs x 3 doses Repeat BMET and Phos level with AM labs  Stacey Stone PharmD, BCPS 11/09/2021 7:33 AM

## 2021-11-09 NOTE — Progress Notes (Addendum)
[place] [facility] [place] NAME: Stacey Stone    MR#:  [id number]  DATE OF BIRTH:  06/21/1945  SUBJECTIVE:  patient feels a lot better. She is eating well. No family at bedside.    VITALS:  Blood pressure (!) 122/91, pulse 79, temperature 97.6 F (36.4 C), temperature source Axillary, resp. rate 18, height '5\' 2"'$  (1.575 m), weight 77.7 kg, SpO2 100 %.  PHYSICAL EXAMINATION:   GENERAL:  76 y.o.-year-old patient lying in the bed with no acute distress.  LUNGS: Normal breath sounds bilaterally, no wheezing, rales, rhonchi.  CARDIOVASCULAR: S1, S2 normal. No murmurs, rubs, or gallops.  ABDOMEN: Soft, nontender, nondistended. Bowel sounds present.  EXTREMITIES: No  edema b/l.    NEUROLOGIC: nonfocal  patient is alert and awake SKIN: No obvious rash, lesion, or ulcer.   LABORATORY PANEL:  CBC Recent Labs  Lab 11/09/21 0549  WBC 0.2*  HGB 8.9*  HCT 26.5*  PLT 21*    Chemistries  Recent Labs  Lab 11/09/21 0549  NA 134*  K 3.7  CL 106  CO2 24  GLUCOSE 94  BUN 5*  CREATININE 0.33*  CALCIUM 8.4*  MG 2.0  AST 25  ALT 36  ALKPHOS 120  BILITOT 1.0   Cardiac Enzymes No results for input(s): "TROPONINI" in the last 168 hours. RADIOLOGY:  DG Chest Port 1 View  Result Date: 11/08/2021 CLINICAL DATA:  Follow-up pneumonia. EXAM: PORTABLE CHEST 1 VIEW COMPARISON:  11/06/2021 FINDINGS: There is a right chest wall port a catheter with tip at the cavoatrial junction. Heart size appears normal scratch set stable cardiomediastinal contours. No pleural effusion or edema. Streaky peribronchial opacities within the left base identified which appears stable to slightly increased from previous exam. IMPRESSION: Streaky peribronchial opacities within the left base, stable to slightly increased from previous exam. Electronically Signed   By: Kerby Moors M.D.   On: 11/08/2021 07:09    Assessment and Plan 76 y.o. 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  Patient was found to have a fever 102.2 in the ER.  Sepsis present on admission febrile neutropenia with recent chemotherapy August 10 history of COVID infection -- patient presented with fever -- placed on broad-spectrum antibiotic with vancomycin, Flagyl,cefepime -- no source of infection identified appears febrile neutropenia -- no fever for more than 36 hour. Will de-escalate antibiotic to cefepime. 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 due to chemotherapy acute on chronic -- hemoglobin stable after two unit blood transfusion  A fib with RVR -- resume sotalol -- eliquis held given low platelet  Hypertension blood pressure stable  Type II diabetes without complication -- continue SSI  Hypothyroidism --  continue Synthroid  Obesity BMI--31.33 -- diet and exercise discussed  Procedures:none Family communication :dter Consults :oncology CODE STATUS: full DVT Prophylaxis :SCD Level of care: Med-Surg Status is: Inpatient Remains inpatient appropriate because: awaiting for WBC to improve and monitor  fever curve    TOTAL TIME TAKING CARE OF THIS PATIENT: 35 minutes.  >50% time spent on counselling and coordination of care  Note: This dictation was prepared with Dragon dictation along with smaller phrase technology. Any transcriptional errors that result from this process are unintentional.  Fritzi Mandes M.D    [facility]   CC: Primary care physician; Rusty Aus, MD

## 2021-11-09 NOTE — Telephone Encounter (Signed)
Patient called to advise she is in the hospital and will not be out for her fluid appointments this week, she will call back when she is home.

## 2021-11-09 NOTE — Telephone Encounter (Signed)
Appts cancelled. Dr. Grayland Ormond, I need new appointment requests if those appointments for 9/21 need to be moved up.

## 2021-11-09 NOTE — TOC CM/SW Note (Addendum)
Patient on airborne/contact precautions. Called her in the room to complete readmission prevention screen but RN was about to take her to the bathroom. Will call back later.  Dayton Scrape, CSW [phone]  10:50 am: Tried calling in the room. No answer. Will try again later.  Dayton Scrape, [place]

## 2021-11-10 ENCOUNTER — Inpatient Hospital Stay: Payer: No Typology Code available for payment source

## 2021-11-10 DIAGNOSIS — D709 Neutropenia, unspecified: Secondary | ICD-10-CM | POA: Diagnosis not present

## 2021-11-10 DIAGNOSIS — R5081 Fever presenting with conditions classified elsewhere: Secondary | ICD-10-CM | POA: Diagnosis not present

## 2021-11-10 LAB — CBC WITH DIFFERENTIAL/PLATELET
Abs Immature Granulocytes: 0 10*3/uL (ref 0.00–0.07)
Abs Immature Granulocytes: 0 10*3/uL (ref 0.00–0.07)
Basophils Absolute: 0 10*3/uL (ref 0.0–0.1)
Basophils Absolute: 0 10*3/uL (ref 0.0–0.1)
Basophils Relative: 5 %
Basophils Relative: 4 %
Eosinophils Absolute: 0 10*3/uL (ref 0.0–0.5)
Eosinophils Absolute: 0 10*3/uL (ref 0.0–0.5)
Eosinophils Relative: 5 %
Eosinophils Relative: 4 %
HCT: 26.5 % — ABNORMAL LOW (ref 36.0–46.0)
HCT: 27.3 % — ABNORMAL LOW (ref 36.0–46.0)
Hemoglobin: 8.9 g/dL — ABNORMAL LOW (ref 12.0–15.0)
Hemoglobin: 9 g/dL — ABNORMAL LOW (ref 12.0–15.0)
Immature Granulocytes: 0 %
Immature Granulocytes: 0 %
Lymphocytes Relative: 64 %
Lymphocytes Relative: 55 %
Lymphs Abs: 0.1 10*3/uL — ABNORMAL LOW (ref 0.7–4.0)
Lymphs Abs: 0.2 10*3/uL — ABNORMAL LOW (ref 0.7–4.0)
MCH: 27.2 pg (ref 26.0–34.0)
MCH: 26.9 pg (ref 26.0–34.0)
MCHC: 33.6 g/dL (ref 30.0–36.0)
MCHC: 33 g/dL (ref 30.0–36.0)
MCV: 81 fL (ref 80.0–100.0)
MCV: 81.5 fL (ref 80.0–100.0)
Monocytes Absolute: 0 10*3/uL — ABNORMAL LOW (ref 0.1–1.0)
Monocytes Absolute: 0 10*3/uL — ABNORMAL LOW (ref 0.1–1.0)
Monocytes Relative: 5 %
Monocytes Relative: 11 %
Neutro Abs: 0 10*3/uL — CL (ref 1.7–7.7)
Neutro Abs: 0.1 10*3/uL — CL (ref 1.7–7.7)
Neutrophils Relative %: 21 %
Neutrophils Relative %: 26 %
Platelets: 21 10*3/uL — CL (ref 150–400)
Platelets: 15 10*3/uL — CL (ref 150–400)
RBC: 3.27 MIL/uL — ABNORMAL LOW (ref 3.87–5.11)
RBC: 3.35 MIL/uL — ABNORMAL LOW (ref 3.87–5.11)
RDW: 20.6 % — ABNORMAL HIGH (ref 11.5–15.5)
RDW: 20.8 % — ABNORMAL HIGH (ref 11.5–15.5)
Smear Review: DECREASED
Smear Review: DECREASED
WBC: 0.2 10*3/uL — CL (ref 4.0–10.5)
WBC: 0.3 10*3/uL — CL (ref 4.0–10.5)
nRBC: 0 % (ref 0.0–0.2)
nRBC: 0 % (ref 0.0–0.2)

## 2021-11-10 MED ORDER — AMOXICILLIN-POT CLAVULANATE 875-125 MG PO TABS
1.0000 | ORAL_TABLET | Freq: Two times a day (BID) | ORAL | Status: DC
  Administered 2021-11-10: 1 via ORAL
  Filled 2021-11-10: qty 1

## 2021-11-10 MED ORDER — IPRATROPIUM-ALBUTEROL 20-100 MCG/ACT IN AERS: 1.0000 | INHALATION_SPRAY | Freq: Four times a day (QID) | RESPIRATORY_TRACT | 1 refills | Status: DC | PRN

## 2021-11-10 MED ORDER — LEVOFLOXACIN 500 MG PO TABS: 500.0000 mg | ORAL_TABLET | Freq: Every day | ORAL | 0 refills | Status: DC

## 2021-11-10 MED ORDER — AMOXICILLIN-POT CLAVULANATE 875-125 MG PO TABS: 1.0000 | ORAL_TABLET | Freq: Two times a day (BID) | ORAL | 0 refills | Status: AC

## 2021-11-10 MED ORDER — APIXABAN 5 MG PO TABS: 5.0000 mg | ORAL_TABLET | Freq: Two times a day (BID) | ORAL | 1 refills | Status: DC

## 2021-11-10 MED ORDER — LEVOFLOXACIN 500 MG PO TABS: 500.0000 mg | ORAL_TABLET | Freq: Every day | ORAL | Status: DC

## 2021-11-10 NOTE — Discharge Instructions (Signed)
Resume Eliquis from 11/18/3021

## 2021-11-10 NOTE — TOC Transition Note (Signed)
Transition of Care [facility]) - CM/SW Discharge Note   Patient Details  Name: TAJANAE GUILBAULT MRN: [id number] Date of Birth: April 12, 1945  Transition of Care [facility]) CM/SW Contact:  Candie Chroman, LCSW Phone Number: 11/10/2021, 9:06 AM   Clinical Narrative:  Patient has orders to discharge home today. No further concerns. CSW signing off.  Final next level of care: Home/Self Care Barriers to Discharge: Barriers Resolved   Patient Goals and CMS Choice        Discharge Placement                Patient to be transferred to facility by: Brother   Patient and family notified of of transfer: 11/10/21  Discharge Plan and Services     Post Acute Care Choice: Resumption of Svcs/PTA Provider                               Social Determinants of Health (SDOH) Interventions     Readmission Risk Interventions    11/09/2021    1:17 PM  Readmission Risk Prevention Plan  Transportation Screening Complete  PCP or Specialist Appt within 3-5 Days Complete  HRI or [place] Complete  Social Work Consult for [place] Planning/Counseling Complete  Palliative Care Screening Not Applicable  Medication Review Press photographer) Complete

## 2021-11-10 NOTE — Discharge Summary (Addendum)
Physician Discharge Summary   Patient: Stacey Stone MRN: [id number] DOB: July 16, 1945  Admit date:     11/06/2021  Discharge date: 11/10/21  Discharge Physician: Fritzi Mandes   PCP: Rusty Aus, MD   Recommendations at discharge:    F/u PCP in 1-2 weeks F/u Dr Grayland Ormond on your upcoming appt  Discharge Diagnoses: Febrile Neutropenia/Pancytopenia s/p Chemotherapy H/o COVID infection  Hospital Course:  76 y.o. 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  Patient was found to have a fever 102.2 in the ER.   Sepsis present on admission febrile neutropenia with recent chemotherapy August 10 history of COVID infection -- patient presented with fever -- placed on broad-spectrum antibiotic with vancomycin, Flagyl,cefepime -- no source of infection identified appears febrile neutropenia -- no fever for more than 36 hour. Will de-escalate antibiotic to po augmentin for 7 days -- Discussed with infectious disease ID   History of COVID-19 infection -- appears long-haul COVID from previous infection -- no respiratory symptoms -- oxygen sats stable -- can continue isolation per infectious disease Dr Juleen China until 11/27/21 and immunosuppression due to chemo   B-cell lymphoma Pancytopenia suspected Chemotherapy induced -- seen by Dr. 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 acute on  chronic -- hemoglobin stable after two unit blood transfusion   A fib with RVR -- resume sotalol -- eliquis held given low platelet--resume 11/18/21 per Oncology rec   Hypertension blood pressure stable   Type II diabetes without complication -- continue SSI   Hypothyroidism -- continue Synthroid   Obesity BMI--31.33 -- diet and exercise discussed  D/c plan discussed with pt and in agreement   Procedures:none Family communication :dter Lenna Sciara Consults :oncology CODE STATUS: full DVT Prophylaxis :SCD Level of care: Med-Surg      Consultants: Oncology Disposition: Home Diet recommendation:  Discharge Diet Orders (From admission, onward)     Start     Ordered   11/10/21 0000  Diet - low sodium heart healthy        11/10/21 0857           Cardiac and Carb modified diet DISCHARGE MEDICATION: Allergies as of 11/10/2021       Reactions   Codeine Other (See Comments)   Stroke like symptoms   Tramadol Itching        Medication List     STOP taking these medications    diltiazem 30 MG tablet Commonly known as: CARDIZEM   ferrous sulfate 325 (65 FE) MG tablet   sulfamethoxazole-trimethoprim 800-160 MG tablet Commonly known as: BACTRIM DS       TAKE these medications    acetaminophen 500 MG tablet Commonly known as: TYLENOL Take 500 mg by mouth every 4 (four) hours as needed.   amLODipine 10 MG tablet Commonly known as: NORVASC Take 1 tablet (10 mg total) by mouth daily.   amoxicillin-clavulanate 875-125 MG tablet Commonly known as: AUGMENTIN  Take 1 tablet by mouth every 12 (twelve) hours for 7 days. May take with food if causes nausea   apixaban 5 MG Tabs tablet Commonly known as: ELIQUIS Take 1 tablet (5 mg total) by mouth 2 (two) times daily. START taking it form 11/18/2021 Start taking on: November 18, 2021 What changed:  additional instructions These instructions start on November 18, 2021. If you are unsure what to do until then, ask  your doctor or other care provider.   atorvastatin 40 MG tablet Commonly known as: LIPITOR Take 1 tablet (40 mg total) by mouth at bedtime.   famotidine 40 MG tablet Commonly known as: PEPCID Take 40 mg by mouth at bedtime.   Fluticasone-Salmeterol 100-50 MCG/DOSE Aepb Commonly known as: ADVAIR Inhale 1 puff into the lungs 2 (two) times daily as needed (for asthma.).   Ipratropium-Albuterol 20-100 MCG/ACT Aers respimat Commonly known as: COMBIVENT Inhale 1 puff into the lungs 4 (four) times daily as needed for wheezing.   isosorbide mononitrate 30 MG 24 hr tablet Commonly known as: IMDUR Take 30 mg by mouth daily.   letrozole 2.5 MG tablet Commonly known as: FEMARA TAKE 1 TABLET BY MOUTH ONCE DAILY   levothyroxine 125 MCG tablet Commonly known as: SYNTHROID Take 125 mcg by mouth daily.   magic mouthwash (nystatin, hydrocortisone, diphenhydrAMINE) suspension Swish and spit 5 mLs 3 (three) times daily as needed for mouth pain.   nitroGLYCERIN 0.4 MG SL tablet Commonly known as: NITROSTAT Place 0.4 mg under the tongue every 5 (five) minutes as needed for chest pain. Maximum 3 doses, If no relief call md or 911.   potassium chloride 10 MEQ tablet Commonly known as: KLOR-CON Take 1 tablet (10 mEq total) by mouth 2 (two) times daily.   predniSONE 20 MG tablet Commonly known as: DELTASONE Take 100 mg by mouth daily. Take with food on days 1-5 of chemotherapy.   RABEprazole 20 MG tablet Commonly known as: ACIPHEX Take 20 mg by mouth daily.   sertraline 50 MG tablet Commonly known as: ZOLOFT Take 50 mg by mouth at bedtime.   sotalol 80 MG tablet Commonly known as: BETAPACE Take 80 mg by mouth 2 (two) times daily.   torsemide 20 MG tablet Commonly known as: DEMADEX Take 10 mg by mouth as directed. Take on Tuesday and Friday               Discharge Care Instructions  (From admission, onward)           Start     Ordered   11/10/21 0000  Discharge wound  care:       Comments: Pressure Injury 11/08/21 Ischial tuberosity Left Stage 2 -  Partial thickness loss of dermis presenting as a shallow open injury with a red, pink wound bed without slough. s2 right thigh  Foam pad as needed   11/10/21 6270            Follow-up Information     Rusty Aus, MD. Go in 1 week(s).   Specialty: Internal Medicine Why: Appointment on Tuesday, 11/17/2021 at 10:15am. Contact information: [place] [address] [phone]         Lloyd Huger, MD. Go to.   Specialty: Oncology Why: your upcoming scheduled appt Contact information: [place] [address] [phone]                Discharge Exam: Filed Weights   11/07/21 0300  11/09/21 0555 11/10/21 0500  Weight: 77.6 kg 77.7 kg 76.4 kg     Condition at discharge: fair  The results of significant diagnostics from this hospitalization (including imaging, microbiology, ancillary and laboratory) are listed below for reference.   Imaging Studies: DG Chest Port 1 View  Result Date: 11/08/2021 CLINICAL DATA:  Follow-up pneumonia. EXAM: PORTABLE CHEST 1 VIEW COMPARISON:  11/06/2021 FINDINGS: There is a right chest wall port a catheter with tip at the cavoatrial junction. Heart size appears normal scratch set stable cardiomediastinal contours. No pleural effusion or edema. Streaky peribronchial opacities within the left base identified which appears stable to slightly increased from previous exam. IMPRESSION: Streaky peribronchial opacities within the left base, stable to slightly increased from previous exam. Electronically Signed   By: Kerby Moors M.D.   On: 11/08/2021 07:09   DG Chest Port 1 View  Result Date: 11/06/2021 CLINICAL DATA:  Questionable sepsis - evaluate for abnormality EXAM: PORTABLE CHEST 1 VIEW COMPARISON:  Chest x-ray 09/23/2021, CT chest 11/16/2016 FINDINGS: Accessed right  Port-A-Cath with tip overlying the distal superior vena cava. The heart and mediastinal contours are unchanged. Atherosclerotic plaque. No focal consolidation. No pulmonary edema. No pleural effusion. No pneumothorax. No acute osseous abnormality. IMPRESSION: 1. No active disease. 2. Aortic Atherosclerosis (ICD10-I70.0). Electronically Signed   By: Iven Finn M.D.   On: 11/06/2021 20:59    Microbiology: Results for orders placed or performed during the hospital encounter of 11/06/21  Culture, blood (Routine x 2)     Status: None (Preliminary result)   Collection Time: 11/06/21  8:33 PM   Specimen: BLOOD  Result Value Ref Range Status   Specimen Description BLOOD RIGHT ANTECUBITAL  Final   Special Requests   Final    BOTTLES DRAWN AEROBIC AND ANAEROBIC Blood Culture adequate volume   Culture   Final    NO GROWTH 3 DAYS Performed at [facility], [address], [place], [place] [address]    Report Status PENDING  Incomplete  Resp Panel by RT-PCR (Flu A&B, Covid) Anterior Nasal Swab     Status: Abnormal   Collection Time: 11/06/21  8:48 PM   Specimen: Anterior Nasal Swab  Result Value Ref Range Status   SARS Coronavirus 2 by RT PCR POSITIVE (A) NEGATIVE Final    Comment: (NOTE) SARS-CoV-2 target nucleic acids are DETECTED.  The SARS-CoV-2 RNA is generally detectable in upper respiratory specimens during the acute phase of infection. Positive results are indicative of the presence of the identified virus, but do not rule out bacterial infection or co-infection with other pathogens not detected by the test. Clinical correlation with patient history and other diagnostic information is necessary to determine patient infection status. The expected result is Negative.  Fact Sheet for Patients: EntrepreneurPulse.com.au  Fact Sheet for Healthcare Providers: IncredibleEmployment.be  This test is not yet approved or cleared by the [place] FDA and  has been authorized for detection and/or diagnosis of SARS-CoV-2 by FDA under an Emergency Use Authorization (EUA).  This EUA will remain in effect (meaning this test can be used) for the duration of  the COVID-19 declaration under Section 564(b)(1) of the A ct, 21 U.S.C. section 360bbb-3(b)(1), unless the authorization is terminated or revoked sooner.     Influenza A by PCR NEGATIVE NEGATIVE Final   Influenza B by PCR NEGATIVE NEGATIVE Final    Comment: (NOTE) The Xpert Xpress SARS-CoV-2/FLU/RSV plus assay is intended as an aid in the diagnosis of influenza from Nasopharyngeal  swab specimens and should not be used as a sole basis for treatment. Nasal washings and aspirates are unacceptable for Xpert Xpress SARS-CoV-2/FLU/RSV testing.  Fact Sheet for Patients: EntrepreneurPulse.com.au  Fact Sheet for Healthcare Providers: IncredibleEmployment.be  This test is not yet approved or cleared by the [place] FDA and has been authorized for detection and/or diagnosis of SARS-CoV-2 by FDA under an Emergency Use Authorization (EUA). This EUA will remain in effect (meaning this test can be used) for the duration of the COVID-19 declaration under Section 564(b)(1) of the Act, 21 U.S.C. section 360bbb-3(b)(1), unless the authorization is terminated or revoked.  Performed at [facility], [address] [place], [place] [address]   MRSA Next Gen by PCR, Nasal     Status: None   Collection Time: 11/07/21  3:25 AM   Specimen: Nasal Mucosa; Nasal Swab  Result Value Ref Range Status   MRSA by PCR Next Gen NOT DETECTED NOT DETECTED Final    Comment: (NOTE) The GeneXpert MRSA Assay (FDA approved for NASAL specimens only), is one component of a comprehensive MRSA colonization surveillance program. It is not intended to diagnose MRSA infection nor to guide or monitor treatment for MRSA infections. Test performance is not FDA  approved in patients less than 87 years old. Performed at [facility], [address] [place], [place] [address]   Culture, blood (Routine X 2) w Reflex to ID Panel     Status: None (Preliminary result)   Collection Time: 11/07/21  4:05 AM   Specimen: BLOOD  Result Value Ref Range Status   Specimen Description BLOOD RIGHT HAND  Final   Special Requests   Final    BOTTLES DRAWN AEROBIC AND ANAEROBIC Blood Culture adequate volume   Culture   Final    NO GROWTH 2 DAYS Performed at [facility], [address] [place], [place] [address]    Report Status PENDING  Incomplete  Respiratory (~20 pathogens) panel by PCR     Status: None   Collection Time: 11/07/21  6:55 PM   Specimen: Nasopharyngeal Swab; Respiratory  Result Value Ref Range Status   Adenovirus NOT DETECTED NOT DETECTED Final   Coronavirus 229E NOT DETECTED NOT DETECTED Final    Comment: (NOTE) The Coronavirus on the Respiratory Panel, DOES NOT test for the novel  Coronavirus (2019 nCoV)    Coronavirus HKU1 NOT DETECTED NOT DETECTED Final   Coronavirus NL63 NOT DETECTED NOT DETECTED Final   Coronavirus OC43 NOT DETECTED NOT DETECTED Final   Metapneumovirus NOT DETECTED NOT DETECTED Final   Rhinovirus / Enterovirus NOT DETECTED NOT DETECTED Final   Influenza A NOT DETECTED NOT DETECTED Final   Influenza B NOT DETECTED NOT DETECTED Final   Parainfluenza Virus 1 NOT DETECTED NOT DETECTED Final   Parainfluenza Virus 2 NOT DETECTED NOT DETECTED Final   Parainfluenza Virus 3 NOT DETECTED NOT DETECTED Final   Parainfluenza Virus 4 NOT DETECTED NOT DETECTED Final   Respiratory Syncytial Virus NOT DETECTED NOT DETECTED Final   Bordetella pertussis NOT DETECTED NOT DETECTED Final   Bordetella Parapertussis NOT DETECTED NOT DETECTED Final   Chlamydophila pneumoniae NOT DETECTED NOT DETECTED Final   Mycoplasma pneumoniae NOT DETECTED NOT DETECTED Final    Comment: Performed at [facility], [place]. [address], [place], [place] [address]    Labs: CBC: Recent Labs  Lab 11/06/21 2033 11/07/21 0405 11/07/21 0930 11/08/21 0605 11/09/21 0549 11/10/21 0503  WBC 0.1* 0.1* 0.1* 0.1* 0.2* 0.3*  NEUTROABS 0.0*  --  0.0* 0.0* 0.0* 0.1*  HGB 7.6* 6.7* 6.5* 9.0* 8.9* 9.0*  HCT 23.9* 21.1* 19.5* 26.8* 26.5* 27.3*  MCV 81.8 81.8 79.9* 81.0 81.0 81.5  PLT 18* 14* 12* 8* 21* 15*   Basic Metabolic Panel: Recent Labs  Lab 11/06/21 2033 11/07/21 0405 11/07/21 0930 11/08/21 0605 11/09/21 0549  NA 134* 133* 132* 137 134*  K 3.4* 3.1* 2.9* 3.2* 3.7  CL 102 105 103 110 106  CO2 23 21* '23 23 24  '$ GLUCOSE 118* 91 87 98 94  BUN 9 8 7* 7* 5*  CREATININE 0.52 0.38* 0.34* 0.44 0.33*  CALCIUM 8.7* 8.1* 8.1* 7.9* 8.4*  MG  --   --  1.9 2.2 2.0  PHOS  --   --  2.6 2.7 2.2*   Liver Function Tests: Recent Labs  Lab 11/06/21 2033 11/07/21 0405 11/07/21 0930 11/08/21 0605 11/09/21 0549  AST '23 25 23 28 25  '$ ALT 40 36 35 39 36  ALKPHOS 112 99 95 108 120  BILITOT 1.2 1.1 1.1 1.3* 1.0  PROT 5.8* 5.2* 5.2* 5.2* 5.2*  ALBUMIN 3.0* 2.6* 2.5* 2.5* 2.5*   CBG: No results for input(s): "GLUCAP" in the last 168 hours.  Discharge time spent: greater than 30 minutes.  Signed: Fritzi Mandes, MD [facility] 11/10/2021

## 2021-11-11 LAB — CULTURE, BLOOD (ROUTINE X 2)
Culture: NO GROWTH
Special Requests: ADEQUATE

## 2021-11-12 ENCOUNTER — Inpatient Hospital Stay: Payer: No Typology Code available for payment source

## 2021-11-12 LAB — CULTURE, BLOOD (ROUTINE X 2)
Culture: NO GROWTH
Special Requests: ADEQUATE

## 2021-11-16 ENCOUNTER — Other Ambulatory Visit: Payer: Self-pay | Admitting: Oncology

## 2021-11-16 ENCOUNTER — Other Ambulatory Visit: Payer: BC Managed Care – PPO

## 2021-11-17 ENCOUNTER — Other Ambulatory Visit: Payer: Self-pay

## 2021-11-17 ENCOUNTER — Encounter: Payer: Self-pay | Admitting: Oncology

## 2021-11-18 NOTE — Progress Notes (Signed)
[place]  [phone]  Fax:([phone]) [place] DOB: Aug 15, 1945  MR#: [id number]  FTD#:[id number]  Patient Care Team: Rusty Aus, MD as PCP - General (Internal Medicine) Isaias Cowman, MD as Consulting Physician (Cardiology) Lloyd Huger, MD as Consulting Physician (Oncology)   CHIEF COMPLAINT: Stage III diffuse large B-cell lymphoma.  INTERVAL HISTORY: Patient returns to clinic today for further evaluation and consideration of cycle 4 of R-CHOP.  She once again was admitted to the hospital with neutropenic fever, but no source was identified.  She currently feels well and is back to her baseline.  She continues to have chronic weakness and fatigue.  She does not complain of any further fevers.  She denies any night sweats or weight loss.  She has no neurologic complaints.  She denies any chest pain, shortness of breath, or hemoptysis.  She denies any nausea, vomiting, constipation, or diarrhea. She has no urinary complaints.  Patient offers no further specific complaints today.  REVIEW OF SYSTEMS:   Review of Systems  Constitutional:  Positive for malaise/fatigue. Negative for chills, diaphoresis, fever and weight loss.  Respiratory: Negative.  Negative for cough and shortness of breath.   Cardiovascular: Negative.  Negative for chest pain and leg swelling.  Gastrointestinal: Negative.  Negative for abdominal pain.  Genitourinary: Negative.  Negative for dysuria.  Musculoskeletal: Negative.  Negative for back pain.  Skin: Negative.  Negative for rash.  Neurological:  Positive for weakness. Negative for sensory change, focal weakness and headaches.  Psychiatric/Behavioral: Negative.  The patient is not nervous/anxious.     As per HPI. Otherwise, a complete review of systems is negative.  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 MammaPrint was reported as low risk, therefore she did not require adjuvant chemotherapy. She completed adjuvant XRT. Will complete 5 year of letrozole in July 2023.   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  Patient's most recent CT scan on April 28, 2018 revealed no evidence of progressive or recurrent disease.    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[place])  07/05/2014 Initial Diagnosis   MALT (mucosa associated lymphoid tissue)   Primary cancer of upper outer quadrant of left female breast ([place])  05/09/2016 Initial Diagnosis   Primary cancer of upper outer quadrant of left female breast ([place])   DLBCL (diffuse large B cell lymphoma) ([place])  09/10/2021 Initial Diagnosis   DLBCL (diffuse large B cell lymphoma) ([place])   09/17/2021 - 10/30/2021 Chemotherapy   Patient is on Treatment Plan : NON-HODGKINS LYMPHOMA R-CHOP q21d     09/17/2021 -  Chemotherapy   Patient is on Treatment Plan : NON-HODGKINS LYMPHOMA R-CHOP q21d     09/29/2021 Cancer Staging   Staging form: Hodgkin and Non-Hodgkin Lymphoma, AJCC 8th Edition - Clinical stage from 09/29/2021: Stage III (Diffuse large B-cell lymphoma) - Signed by Lloyd Huger, MD on 09/29/2021 Stage prefix: Initial diagnosis     PAST MEDICAL HISTORY: Past Medical History:  Diagnosis Date   Abdominal pain, left lower quadrant    epiploic appendagitis   Anginal pain ([place])    Arthritis 06/06/2018   knee   Asthma    mild   Atrial fibrillation ([place])    Breast cancer ([place])  Coronary artery disease 06/06/2018   1 artery 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 Mamie Nick. at [facility] in [place]   Depression    Diabetes mellitus without complication ([place])    Dyspnea    Dysrhythmia    GERD (gastroesophageal reflux disease)    Hypertension    Hypothyroidism    MALT (mucosa associated  lymphoid tissue) ([place]) 07/05/2014   Right neck mass resected 01/2014.   No pertinent past medical history    Pancytopenia ([place])    Personal history of radiation therapy    Sleep apnea 06/06/2018   O2- 2l at bedtime and BiPap @ bedtime    PAST SURGICAL HISTORY: Past Surgical History:  Procedure Laterality Date   BREAST BIOPSY Left 2/136/2018   INVASIVE MAMMARY CARCINOMA   BREAST BIOPSY Right 05/25/2016   FIBROCYSTIC CHANGE WITH CALCIFICATIONS    BREAST BIOPSY Right 05/29/2020   Affirm bx-"X" clip-FIBROADENOMA WITH ASSOCIATED CALCIFICATIONS. - NEGATIVE   BREAST EXCISIONAL BIOPSY Left 06/04/2016   INVASIVE MAMMARY CARCINOMA.    BREAST LUMPECTOMY Left 06/04/2016   INVASIVE MAMMARY CARCINOMA.    CARDIAC CATHETERIZATION     CARDIAC CATHETERIZATION N/A 09/24/2015   Procedure: Left Heart Cath and Coronary Angiography;  Surgeon: Isaias Cowman, MD;  Location: [facility];  Service: Cardiovascular;  Laterality: N/A;   CARDIAC CATHETERIZATION N/A 09/24/2015   Procedure: Coronary Stent Intervention;  Surgeon: Isaias Cowman, MD;  Location: [facility];  Service: Cardiovascular;  Laterality: N/A;   CHOLECYSTECTOMY  11/26   08/1970   COLONOSCOPY WITH PROPOFOL N/A 07/08/2021   Procedure: COLONOSCOPY WITH PROPOFOL;  Surgeon: Toledo, Benay Pike, MD;  Location: [facility];  Service: Gastroenterology;  Laterality: N/A;   DILATATION & CURETTAGE/HYSTEROSCOPY WITH MYOSURE N/A 05/05/2015   Procedure: Hysteroscopy and endometrial curretage;  Surgeon: Boykin Nearing, MD;  Location: [facility];  Service: Gynecology;  Laterality: N/A;   DILATION AND CURETTAGE OF UTERUS     EXCISION MASS FROM NECK  Right    Dr. Tami Ribas   EYE SURGERY  05/29/2020   bil cataract 9/08   HAMMER TOE SURGERY Right    IR BONE MARROW BIOPSY & ASPIRATION  09/09/2021   IR IMAGING GUIDED PORT INSERTION  09/09/2021   JOINT REPLACEMENT Bilateral 2008, 11/26/ 2012   Partial Knee Replacements   LEFT  HEART CATH AND CORONARY ANGIOGRAPHY N/A 10/25/2017   Procedure: LEFT HEART CATH AND CORONARY ANGIOGRAPHY;  Surgeon: Teodoro Spray, MD;  Location: [facility];  Service: Cardiovascular;  Laterality: N/A;   LEFT HEART CATH AND CORONARY ANGIOGRAPHY N/A 10/26/2017   Procedure: LEFT HEART CATH AND CORONARY ANGIOGRAPHY;  Surgeon: Isaias Cowman, MD;  Location: [facility];  Service: Cardiovascular;  Laterality: N/A;   PARTIAL MASTECTOMY WITH NEEDLE LOCALIZATION Left 06/04/2016   Procedure: PARTIAL MASTECTOMY WITH NEEDLE LOCALIZATION;  Surgeon: Leonie Green, MD;  Location: [facility];  Service: General;  Laterality: Left;   SCLERAL BUCKLE  02/16/2011   Procedure: SCLERAL BUCKLE;  Surgeon: Hayden Pedro, MD;  Location: [facility];  Service: Ophthalmology;  Laterality: Left;  Scleral Buckle Left Eye with Headscope Laser   SENTINEL NODE BIOPSY Left 06/04/2016   Procedure: SENTINEL NODE BIOPSY;  Surgeon: Leonie Green, MD;  Location: [facility];  Service: General;  Laterality: Left;    FAMILY HISTORY Family History  Problem Relation Age of Onset   Breast cancer Mother 41   Heart disease Father    Breast cancer Paternal Aunt 70   Breast cancer Cousin  Anesthesia problems Neg Hx    Hypotension Neg Hx    Malignant hyperthermia Neg Hx    Pseudochol deficiency Neg Hx     GYNECOLOGIC HISTORY:  No LMP recorded. Patient is postmenopausal.     ADVANCED DIRECTIVES:    HEALTH MAINTENANCE: Social History   Tobacco Use   Smoking status: Never   Smokeless tobacco: Never  Vaping Use   Vaping Use: Never used  Substance Use Topics   Alcohol use: No   Drug use: No     Colonoscopy:  PAP:  Bone density:  Mammogram: November 2016  Allergies  Allergen Reactions   Codeine Other (See Comments)    Stroke like symptoms   Tramadol Itching    Current Outpatient Medications  Medication Sig Dispense Refill   acetaminophen (TYLENOL) 500 MG tablet Take 500 mg by mouth  every 4 (four) hours as needed.     amLODipine (NORVASC) 10 MG tablet Take 1 tablet (10 mg total) by mouth daily. 30 tablet 0   apixaban (ELIQUIS) 5 MG TABS tablet Take 1 tablet (5 mg total) by mouth 2 (two) times daily. START taking it form 11/18/2021 60 tablet 1   atorvastatin (LIPITOR) 40 MG tablet Take 1 tablet (40 mg total) by mouth at bedtime.     famotidine (PEPCID) 40 MG tablet Take 40 mg by mouth at bedtime.     Fluticasone-Salmeterol (ADVAIR) 100-50 MCG/DOSE AEPB Inhale 1 puff into the lungs 2 (two) times daily as needed (for asthma.).     Ipratropium-Albuterol (COMBIVENT) 20-100 MCG/ACT AERS respimat Inhale 1 puff into the lungs 4 (four) times daily as needed for wheezing. 1 each 1   isosorbide mononitrate (IMDUR) 30 MG 24 hr tablet Take 30 mg by mouth daily.     letrozole (FEMARA) 2.5 MG tablet TAKE 1 TABLET BY MOUTH ONCE DAILY 90 tablet 3   levofloxacin (LEVAQUIN) 500 MG tablet Take 1 tablet (500 mg total) by mouth daily for 14 days. 14 tablet 0   levothyroxine (SYNTHROID) 125 MCG tablet Take 125 mcg by mouth daily.     magic mouthwash (nystatin, hydrocortisone, diphenhydrAMINE) suspension Swish and spit 5 mLs 3 (three) times daily as needed for mouth pain. 540 mL 0   nitroGLYCERIN (NITROSTAT) 0.4 MG SL tablet Place 0.4 mg under the tongue every 5 (five) minutes as needed for chest pain. Maximum 3 doses, If no relief call md or 911.     potassium chloride (KLOR-CON) 10 MEQ tablet Take 1 tablet (10 mEq total) by mouth 2 (two) times daily.     predniSONE (DELTASONE) 20 MG tablet Take 100 mg by mouth daily. Take with food on days 1-5 of chemotherapy.     RABEprazole (ACIPHEX) 20 MG tablet Take 20 mg by mouth daily.     sertraline (ZOLOFT) 50 MG tablet Take 50 mg by mouth at bedtime.     sotalol (BETAPACE) 80 MG tablet Take 80 mg by mouth 2 (two) times daily.     torsemide (DEMADEX) 20 MG tablet Take 10 mg by mouth as directed. Take on Tuesday and Friday (Patient not taking: Reported on  09/23/2021)     No current facility-administered medications for this visit.   Facility-Administered Medications Ordered in Other Visits  Medication Dose Route Frequency Provider Last Rate Last Admin   acetaminophen (TYLENOL) 325 MG tablet            diphenhydrAMINE (BENADRYL) 25 mg capsule            heparin  lock flush 100 UNIT/ML injection            heparin lock flush 100 UNIT/ML injection            palonosetron (ALOXI) 0.25 MG/5ML injection             OBJECTIVE: BP 105/73 (BP Location: Right Arm, Patient Position: Sitting)   Pulse 82   Temp (!) 97.3 F (36.3 C) (Tympanic)   Wt 163 lb (73.9 kg)   BMI 29.81 kg/m    Body mass index is 29.81 kg/m.    ECOG FS:1 - Symptomatic but completely ambulatory  General: Well-developed, well-nourished, no acute distress. Eyes: Pink conjunctiva, anicteric sclera. HEENT: Normocephalic, moist mucous membranes. Lungs: No audible wheezing or coughing. Heart: Regular rate and rhythm. Abdomen: Soft, nontender, no obvious distention. Musculoskeletal: No edema, cyanosis, or clubbing. Neuro: Alert, answering all questions appropriately. Cranial nerves grossly intact. Skin: No rashes or petechiae noted. Psych: Normal affect. Lymphatics: No palpable lymphadenopathy.  LAB RESULTS:   STUDIES: No results found.  ASSESSMENT: Stage III diffuse large B-cell lymphoma.  PLAN:    Stage III diffuse large B-cell lymphoma: PET scan and biopsy results reviewed independently.  Case discussed with pathology confirming this is not a recurrence of patient's MALT lymphoma.  Bone marrow biopsy did not reveal any evidence of lymphoma.  MUGA scan completed on September 15, 2021 revealed an EF of 75%.  Repeat in September 2023 if necessary.  Patient will benefit from 6 cycles of R-CHOP every 21 days with Udenyca support.  Proceed with cycle 4 of treatment today.  Return to clinic tomorrow for [facility].  Patient also return to clinic twice next week for IV fluids.  Patient  will then return to clinic in 3 weeks for further evaluation and consideration of cycle 5.  Will repeat PET scan and MUGA scan prior to next treatment.   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 MammaPrint was reported as low risk, therefore she did not require adjuvant chemotherapy. She completed adjuvant XRT.  Patient completed 5 years of letrozole in June 2023.  Her most recent mammogram on May 20, 2021 was reported as BI-RADS 1.  Repeat in March 2024.   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  Case discussed with pathology.  This is a different primary.   Left renal lesion: CT scan results from December 15, 2020 with only mild increase of benign renal lesion to 13 mm.  No intervention is needed at this time. Bone mineral density: Patient's most recent bone mineral density on November 13, 2020 revealed a T score of -2.0 which is slightly worse than 1 year prior where her T score was reported -1.7.  Continue calcium and vitamin D supplementation.  Repeat bone mineral density at the completion of chemotherapy.   Anemia: Hemoglobin is trended down to 10.0, monitor. Neutropenia: Resolved.  Udenyca as above. Thrombocytopenia: Resolved.   COVID: Symptoms have resolved, although patient remains COVID-positive on lab work. Recurrent UTI: Patient was given a prescription for 500 mg Levaquin daily prophylactic.    Patient expressed understanding and was in agreement with this plan. She also understands that She can call clinic at any time with any questions, concerns, or complaints.     Lloyd Huger, MD   11/21/2021 7:00  AM

## 2021-11-19 ENCOUNTER — Ambulatory Visit: Payer: 59 | Admitting: Oncology

## 2021-11-19 ENCOUNTER — Encounter: Payer: Self-pay | Admitting: Oncology

## 2021-11-19 ENCOUNTER — Inpatient Hospital Stay (HOSPITAL_BASED_OUTPATIENT_CLINIC_OR_DEPARTMENT_OTHER): Payer: No Typology Code available for payment source | Admitting: Oncology

## 2021-11-19 ENCOUNTER — Inpatient Hospital Stay: Payer: No Typology Code available for payment source

## 2021-11-19 ENCOUNTER — Other Ambulatory Visit: Payer: 59

## 2021-11-19 ENCOUNTER — Ambulatory Visit: Payer: 59

## 2021-11-19 VITALS — BP 105/73 | HR 82 | Temp 97.3°F | Wt 163.0 lb

## 2021-11-19 DIAGNOSIS — C8338 Diffuse large B-cell lymphoma, lymph nodes of multiple sites: Secondary | ICD-10-CM

## 2021-11-19 DIAGNOSIS — Z5111 Encounter for antineoplastic chemotherapy: Secondary | ICD-10-CM | POA: Diagnosis not present

## 2021-11-19 LAB — COMPREHENSIVE METABOLIC PANEL
ALT: 16 U/L (ref 0–44)
AST: 25 U/L (ref 15–41)
Albumin: 3.4 g/dL — ABNORMAL LOW (ref 3.5–5.0)
Alkaline Phosphatase: 102 U/L (ref 38–126)
Anion gap: 8 (ref 5–15)
BUN: 8 mg/dL (ref 8–23)
CO2: 27 mmol/L (ref 22–32)
Calcium: 9 mg/dL (ref 8.9–10.3)
Chloride: 101 mmol/L (ref 98–111)
Creatinine, Ser: 0.58 mg/dL (ref 0.44–1.00)
GFR, Estimated: >60 mL/min (ref >60)
Glucose, Bld: 123 mg/dL — ABNORMAL HIGH (ref 70–99)
Potassium: 3.5 mmol/L (ref 3.5–5.1)
Sodium: 136 mmol/L (ref 135–145)
Total Bilirubin: 0.6 mg/dL (ref 0.3–1.2)
Total Protein: 6.6 g/dL (ref 6.5–8.1)

## 2021-11-19 LAB — CBC WITH DIFFERENTIAL/PLATELET
Abs Immature Granulocytes: 0.17 10*3/uL — ABNORMAL HIGH (ref 0.00–0.07)
Basophils Absolute: 0.1 10*3/uL (ref 0.0–0.1)
Basophils Relative: 2 %
Eosinophils Absolute: 0 10*3/uL (ref 0.0–0.5)
Eosinophils Relative: 0 %
HCT: 32.6 % — ABNORMAL LOW (ref 36.0–46.0)
Hemoglobin: 10.6 g/dL — ABNORMAL LOW (ref 12.0–15.0)
Immature Granulocytes: 4 %
Lymphocytes Relative: 14 %
Lymphs Abs: 0.6 10*3/uL — ABNORMAL LOW (ref 0.7–4.0)
MCH: 28.5 pg (ref 26.0–34.0)
MCHC: 32.5 g/dL (ref 30.0–36.0)
MCV: 87.6 fL (ref 80.0–100.0)
Monocytes Absolute: 0.8 10*3/uL (ref 0.1–1.0)
Monocytes Relative: 18 %
Neutro Abs: 2.8 10*3/uL (ref 1.7–7.7)
Neutrophils Relative %: 62 %
Platelets: 238 10*3/uL (ref 150–400)
RBC: 3.72 MIL/uL — ABNORMAL LOW (ref 3.87–5.11)
RDW: 24.6 % — ABNORMAL HIGH (ref 11.5–15.5)
WBC: 4.5 10*3/uL (ref 4.0–10.5)
nRBC: 0.4 % — ABNORMAL HIGH (ref 0.0–0.2)

## 2021-11-19 MED ORDER — DIPHENHYDRAMINE HCL 25 MG PO CAPS
25.0000 mg | ORAL_CAPSULE | Freq: Once | ORAL | Status: AC
  Administered 2021-11-19: 25 mg via ORAL
  Filled 2021-11-19: qty 1

## 2021-11-19 MED ORDER — SODIUM CHLORIDE 0.9 % IV SOLN
Freq: Once | INTRAVENOUS | Status: AC
  Filled 2021-11-19: qty 250

## 2021-11-19 MED ORDER — LEVOFLOXACIN 500 MG PO TABS: 500.0000 mg | ORAL_TABLET | Freq: Every day | ORAL | 0 refills | Status: DC

## 2021-11-19 MED ORDER — HEPARIN SOD (PORK) LOCK FLUSH 100 UNIT/ML IV SOLN
500.0000 [IU] | Freq: Once | INTRAVENOUS | Status: AC | PRN
  Administered 2021-11-19: 500 [IU]
  Filled 2021-11-19: qty 5

## 2021-11-19 MED ORDER — DOXORUBICIN HCL CHEMO IV INJECTION 2 MG/ML
50.0000 mg/m2 | Freq: Once | INTRAVENOUS | Status: AC
  Administered 2021-11-19: 90 mg via INTRAVENOUS
  Filled 2021-11-19: qty 45

## 2021-11-19 MED ORDER — SODIUM CHLORIDE 0.9 % IV SOLN
10.0000 mg | Freq: Once | INTRAVENOUS | Status: AC
  Administered 2021-11-19: 10 mg via INTRAVENOUS
  Filled 2021-11-19: qty 10

## 2021-11-19 MED ORDER — SODIUM CHLORIDE 0.9 % IV SOLN
1420.0000 mg | Freq: Once | INTRAVENOUS | Status: AC
  Administered 2021-11-19: 1420 mg via INTRAVENOUS
  Filled 2021-11-19: qty 71

## 2021-11-19 MED ORDER — SODIUM CHLORIDE 0.9 % IV SOLN: 375.0000 mg/m2 | Freq: Once | INTRAVENOUS | Status: DC

## 2021-11-19 MED ORDER — VINCRISTINE SULFATE CHEMO INJECTION 1 MG/ML
2.0000 mg | Freq: Once | INTRAVENOUS | Status: AC
  Administered 2021-11-19: 2 mg via INTRAVENOUS
  Filled 2021-11-19: qty 2

## 2021-11-19 MED ORDER — SODIUM CHLORIDE 0.9 % IV SOLN
375.0000 mg/m2 | Freq: Once | INTRAVENOUS | Status: AC
  Administered 2021-11-19: 700 mg via INTRAVENOUS
  Filled 2021-11-19: qty 50

## 2021-11-19 MED ORDER — SODIUM CHLORIDE 0.9 % IV SOLN
150.0000 mg | Freq: Once | INTRAVENOUS | Status: AC
  Administered 2021-11-19: 150 mg via INTRAVENOUS
  Filled 2021-11-19: qty 150

## 2021-11-19 MED ORDER — ACETAMINOPHEN 325 MG PO TABS
650.0000 mg | ORAL_TABLET | Freq: Once | ORAL | Status: AC
  Administered 2021-11-19: 650 mg via ORAL
  Filled 2021-11-19: qty 2

## 2021-11-19 MED ORDER — SODIUM CHLORIDE 0.9% FLUSH
10.0000 mL | INTRAVENOUS | Status: DC | PRN
  Administered 2021-11-19: 10 mL
  Filled 2021-11-19: qty 10

## 2021-11-19 MED ORDER — PALONOSETRON HCL INJECTION 0.25 MG/5ML
0.2500 mg | Freq: Once | INTRAVENOUS | Status: AC
  Administered 2021-11-19: 0.25 mg via INTRAVENOUS
  Filled 2021-11-19: qty 5

## 2021-11-19 NOTE — Patient Instructions (Addendum)
[facility] CTR AT [place]  Discharge Instructions: Thank you for choosing [facility] to provide your oncology and hematology care.  If you have a lab appointment with the [facility], please go directly to the [facility] and check in at the registration area.  Wear comfortable clothing and clothing appropriate for easy access to any Portacath or PICC line.   We strive to give you quality time with your provider. You may need to reschedule your appointment if you arrive late (15 or more minutes).  Arriving late affects you and other patients whose appointments are after yours.  Also, if you miss three or more appointments without notifying the office, you may be dismissed from the clinic at the provider's discretion.      For prescription refill requests, have your pharmacy contact our office and allow 72 hours for refills to be completed.    Today you received the following chemotherapy and/or immunotherapy agents: Adriamycin, Vincristine, Cytoxan, Rituxan      To help prevent nausea and vomiting after your treatment, we encourage you to take your nausea medication as directed.  BELOW ARE SYMPTOMS THAT SHOULD BE REPORTED IMMEDIATELY: *FEVER GREATER THAN 100.4 F (38 C) OR HIGHER *CHILLS OR SWEATING *NAUSEA AND VOMITING THAT IS NOT CONTROLLED WITH YOUR NAUSEA MEDICATION *UNUSUAL SHORTNESS OF BREATH *UNUSUAL BRUISING OR BLEEDING *URINARY PROBLEMS (pain or burning when urinating, or frequent urination) *BOWEL PROBLEMS (unusual diarrhea, constipation, pain near the anus) TENDERNESS IN MOUTH AND THROAT WITH OR WITHOUT PRESENCE OF ULCERS (sore throat, sores in mouth, or a toothache) UNUSUAL RASH, SWELLING OR PAIN  UNUSUAL VAGINAL DISCHARGE OR ITCHING   Items with * indicate a potential emergency and should be followed up as soon as possible or go to the Emergency Department if any problems should occur.  Please show the CHEMOTHERAPY ALERT CARD or  IMMUNOTHERAPY ALERT CARD at check-in to the Emergency Department and triage nurse.  Should you have questions after your visit or need to cancel or reschedule your appointment, please contact [facility] [place] AT [place]  [phone] and follow the prompts.  Office hours are 8:00 a.m. to 4:30 p.m. Monday - Friday. Please note that voicemails left after 4:00 p.m. may not be returned until the following business day.  We are closed weekends and major holidays. You have access to a nurse at all times for urgent questions. Please call the main number to the clinic [phone] and follow the prompts.  For any non-urgent questions, you may also contact your provider using MyChart. We now offer e-Visits for anyone 32 and older to request care online for non-urgent symptoms. For details visit [email or web address].   Also download the MyChart app! Go to the app store, search "MyChart", open the app, select [facility], and log in with your MyChart username and password.  Masks are optional in the cancer centers. If you would like for your care team to wear a mask while they are taking care of you, please let them know. For doctor visits, patients may have with them one support person who is at least 78 years old. At this time, visitors are not allowed in the infusion area.

## 2021-11-20 ENCOUNTER — Telehealth: Payer: Self-pay | Admitting: *Deleted

## 2021-11-20 ENCOUNTER — Emergency Department
Admission: EM | Admit: 2021-11-20 | Discharge: 2021-11-20 | Disposition: A | Discharge status: Home / Self Care | Payer: 59 | Attending: Emergency Medicine | Admitting: Emergency Medicine

## 2021-11-20 ENCOUNTER — Encounter: Payer: Self-pay | Admitting: Emergency Medicine

## 2021-11-20 ENCOUNTER — Other Ambulatory Visit: Payer: Self-pay

## 2021-11-20 ENCOUNTER — Inpatient Hospital Stay: Payer: No Typology Code available for payment source | Attending: Nurse Practitioner

## 2021-11-20 ENCOUNTER — Ambulatory Visit: Payer: 59

## 2021-11-20 DIAGNOSIS — R339 Retention of urine, unspecified: Secondary | ICD-10-CM | POA: Diagnosis not present

## 2021-11-20 DIAGNOSIS — I2510 Atherosclerotic heart disease of native coronary artery without angina pectoris: Secondary | ICD-10-CM | POA: Insufficient documentation

## 2021-11-20 DIAGNOSIS — C8338 Diffuse large B-cell lymphoma, lymph nodes of multiple sites: Secondary | ICD-10-CM | POA: Diagnosis present

## 2021-11-20 DIAGNOSIS — R5383 Other fatigue: Secondary | ICD-10-CM | POA: Diagnosis not present

## 2021-11-20 DIAGNOSIS — I10 Essential (primary) hypertension: Secondary | ICD-10-CM | POA: Insufficient documentation

## 2021-11-20 DIAGNOSIS — D709 Neutropenia, unspecified: Secondary | ICD-10-CM | POA: Diagnosis present

## 2021-11-20 DIAGNOSIS — R531 Weakness: Secondary | ICD-10-CM | POA: Insufficient documentation

## 2021-11-20 LAB — URINALYSIS, ROUTINE W REFLEX MICROSCOPIC
Bilirubin Urine: NEGATIVE
Glucose, UA: NEGATIVE mg/dL
Hgb urine dipstick: NEGATIVE
Ketones, ur: NEGATIVE mg/dL
Leukocytes,Ua: NEGATIVE
Nitrite: NEGATIVE
Protein, ur: NEGATIVE mg/dL
Specific Gravity, Urine: 1.018 (ref 1.005–1.030)
pH: 6 (ref 5.0–8.0)

## 2021-11-20 LAB — COMPREHENSIVE METABOLIC PANEL
ALT: 35 U/L (ref 0–44)
AST: 64 U/L — ABNORMAL HIGH (ref 15–41)
Albumin: 3.6 g/dL (ref 3.5–5.0)
Alkaline Phosphatase: 120 U/L (ref 38–126)
Anion gap: 10 (ref 5–15)
BUN: 15 mg/dL (ref 8–23)
CO2: 23 mmol/L (ref 22–32)
Calcium: 9.5 mg/dL (ref 8.9–10.3)
Chloride: 107 mmol/L (ref 98–111)
Creatinine, Ser: 0.75 mg/dL (ref 0.44–1.00)
GFR, Estimated: >60 mL/min (ref >60)
Glucose, Bld: 178 mg/dL — ABNORMAL HIGH (ref 70–99)
Potassium: 4.4 mmol/L (ref 3.5–5.1)
Sodium: 140 mmol/L (ref 135–145)
Total Bilirubin: 0.8 mg/dL (ref 0.3–1.2)
Total Protein: 6.6 g/dL (ref 6.5–8.1)

## 2021-11-20 LAB — CBC WITH DIFFERENTIAL/PLATELET
Abs Immature Granulocytes: 0.1 10*3/uL — ABNORMAL HIGH (ref 0.00–0.07)
Basophils Absolute: 0 10*3/uL (ref 0.0–0.1)
Basophils Relative: 0 %
Eosinophils Absolute: 0 10*3/uL (ref 0.0–0.5)
Eosinophils Relative: 0 %
HCT: 30.7 % — ABNORMAL LOW (ref 36.0–46.0)
Hemoglobin: 10 g/dL — ABNORMAL LOW (ref 12.0–15.0)
Immature Granulocytes: 1 %
Lymphocytes Relative: 2 %
Lymphs Abs: 0.2 10*3/uL — ABNORMAL LOW (ref 0.7–4.0)
MCH: 28.7 pg (ref 26.0–34.0)
MCHC: 32.6 g/dL (ref 30.0–36.0)
MCV: 88.2 fL (ref 80.0–100.0)
Monocytes Absolute: 0.4 10*3/uL (ref 0.1–1.0)
Monocytes Relative: 6 %
Neutro Abs: 6.9 10*3/uL (ref 1.7–7.7)
Neutrophils Relative %: 91 %
Platelets: 181 10*3/uL (ref 150–400)
RBC: 3.48 MIL/uL — ABNORMAL LOW (ref 3.87–5.11)
RDW: 24 % — ABNORMAL HIGH (ref 11.5–15.5)
Smear Review: NORMAL
WBC: 7.6 10*3/uL (ref 4.0–10.5)
nRBC: 0 % (ref 0.0–0.2)

## 2021-11-20 MED ORDER — SOTALOL HCL 80 MG PO TABS
80.0000 mg | ORAL_TABLET | Freq: Two times a day (BID) | ORAL | Status: DC
  Administered 2021-11-20: 80 mg via ORAL
  Filled 2021-11-20: qty 1

## 2021-11-20 MED ORDER — PEGFILGRASTIM INJECTION 6 MG/0.6ML ~~LOC~~
6.0000 mg | PREFILLED_SYRINGE | Freq: Once | SUBCUTANEOUS | Status: AC
  Administered 2021-11-20: 6 mg via SUBCUTANEOUS
  Filled 2021-11-20: qty 0.6

## 2021-11-20 NOTE — ED Triage Notes (Signed)
Patient to ED for difficulty urinating. Patient received chemo (B cell lymphoma) treatment yesterday. Patient states she hasn't urinated normally since yesterday- only dribbles. Patient states she has gained 6 lbs since yesterday. Denies pain at this time.

## 2021-11-20 NOTE — Telephone Encounter (Signed)
I spoke with Merrily Pew, NP. He is concerned that pt urinary retention may be an oncologist emergency. Daughter called back and advised to take patient to ER per direction of Josh, NP. Patient is unable to void-Patient has not urinated much (only dribbles) in the last 24 hours despite increase oral intake/ iv fluids yesterday. Discussed that patient needs a bladder scanner to evaluate urinary retention and possibly foley placement if meets requirements and further emergency workup.  Daughter agreed to take patient to ER.

## 2021-11-20 NOTE — ED Notes (Signed)
Bladder scan was 48m average post-void

## 2021-11-20 NOTE — ED Provider Notes (Signed)
[facility] Provider Note    Event Date/Time   First MD Initiated Contact with Patient 11/20/21 2121     (approximate)  History   Chief Complaint: Urinary Retention  HPI  Stacey Stone is a 76 y.o. female with a past medical history of paroxysmal atrial fibrillation, gastric reflux, hypertension, CAD, B-cell lymphoma on chemotherapy who presents to the emergency department for urinary retention.  According to the patient since receiving her fourth round of chemotherapy yesterday she states she has not been able to urinate.  States when she urinates she only dribbles a small amount of urine and then has to urinate shortly afterwards.  She did state once arriving to the emergency department she did urinate a decent amount for the first time since yesterday.  Patient denies any dysuria.  No fever.  Physical Exam   Triage Vital Signs: ED Triage Vitals  Enc Vitals Group     BP 11/20/21 1646 (!) 142/107     Pulse Rate 11/20/21 1646 85     Resp 11/20/21 1646 18     Temp 11/20/21 1646 98.2 F (36.8 C)     Temp Source 11/20/21 1646 Oral     SpO2 11/20/21 1646 97 %     Weight 11/20/21 1647 166 lb (75.3 kg)     Height 11/20/21 1647 '5\' 2"'$  (1.575 m)     Head Circumference --      Peak Flow --      Pain Score 11/20/21 1645 0     Pain Loc --      Pain Edu? --      Excl. in [place]? --     Most recent vital signs: Vitals:   11/20/21 1646  BP: (!) 142/107  Pulse: 85  Resp: 18  Temp: 98.2 F (36.8 C)  SpO2: 97%    General: Awake, no distress.  CV:  Good peripheral perfusion.  Regular rate and rhythm  Resp:  Normal effort.  Equal breath sounds bilaterally.  Abd:  Soft, mild distention to the lower abdomen although nontender to palpation.  No rebound or guarding.  Abdomen otherwise benign.    ED Results / Procedures / Treatments    MEDICATIONS ORDERED IN ED: Medications  sotalol (BETAPACE) tablet 80 mg (has no administration in time range)      IMPRESSION / MDM / ASSESSMENT AND PLAN / ED COURSE  I reviewed the triage vital signs and the nursing notes.  Patient's presentation is most consistent with acute presentation with potential threat to life or bodily function.  Patient presents emergency department for urinary retention since her B-cell lymphoma chemotherapy yesterday.  Currently the patient appears well does have mild fullness of the lower abdomen.  Patient's blood work shows a normal CBC and reassuringly normal chemistry with normal renal function.  We will obtain a urine sample and a postvoid bladder scan.  Patient states she is 30 minutes overdue for her sotalol we will dose her sotalol in the emergency department.  Patient agreeable to plan of care.  Lab work is reassuring with a normal CBC, normal chemistry including normal renal function.  Urinalysis has resulted normal as well, postvoid bladder scan of 35 mL.  Patient appears to be urinating well and states she felt like she urinated fully this time.  It is unclear the cause of the patient's urinary retention earlier but the patient appears to be urinating adequately and appropriately currently.  We will have the patient follow-up with her  doctor.  Patient agreeable to plan of care.  FINAL CLINICAL IMPRESSION(S) / ED DIAGNOSES   Urinary retention  Note:  This document was prepared using Dragon voice recognition software and may include unintentional dictation errors.   Harvest Dark, MD 11/20/21 2249

## 2021-11-20 NOTE — Discharge Instructions (Signed)
You have been seen in the emergency department for difficulty urinating.  Your work-up has shown normal results including normal kidney function and a normal urinalysis with no sign of infection.  Please continue to stay well-hydrated at home.  Return to the emergency department if you are unable to urinate or develop abdominal pain/fullness or for any other symptom personally concerning to yourself.

## 2021-11-20 NOTE — Telephone Encounter (Signed)
Daughter called reporting that patient had chemotherapy yesterday (RCHOP) and she who usually is dribbling urine every time she stands up has only used the bathroom 3 times and states that she only dribbled. She has gained 6 pounds since yesterday as well. She has drank 3 -4 20 oz tumblers of water today and had 4 last evening after she got home from [facility]. She has had UTI's after her other chemotherapy treatments, but is asymptomatic at this time. Please advise

## 2021-11-20 NOTE — ED Provider Triage Note (Signed)
  Emergency Medicine Provider Triage Evaluation Note  Stacey Stone , a 76 y.o.female,  was evaluated in triage.  Pt complains of urinary retention.  Patient states that she has been unable to urinate since yesterday.  She has had multiple glass of water and a milkshake, though still continues only feel small amounts of urine coming out.  She states that she feels some pressure along her bladder, no significant pain.  No other symptoms at this time.   Review of Systems  Positive: Bladder fullness, urinary retention Negative: Denies fever, chest pain, vomiting  Physical Exam   Vitals:   11/20/21 1646  BP: (!) 142/107  Pulse: 85  Resp: 18  Temp: 98.2 F (36.8 C)  SpO2: 97%   Gen:   Awake, no distress   Resp:  Normal effort  MSK:   Moves extremities without difficulty  Other:    Medical Decision Making  Given the patient's initial medical screening exam, the following diagnostic evaluation has been ordered. The patient will be placed in the appropriate treatment space, once one is available, to complete the evaluation and treatment. I have discussed the plan of care with the patient and I have advised the patient that an ED physician or mid-level practitioner will reevaluate their condition after the test results have been received, as the results may give them additional insight into the type of treatment they may need.    Diagnostics: Labs, UA, bladder scan  Treatments: none immediately   Teodoro Spray, [place] 11/20/21 1648

## 2021-11-20 NOTE — ED Notes (Signed)
Bladder scan 17m,  3 attempts.  Pt felt like she urinated a bit before scan

## 2021-11-21 ENCOUNTER — Encounter: Payer: Self-pay | Admitting: Oncology

## 2021-11-22 ENCOUNTER — Encounter: Payer: Self-pay | Admitting: Oncology

## 2021-11-22 LAB — URINE CULTURE: Culture: <10000 — AB

## 2021-11-22 NOTE — Telephone Encounter (Signed)
Patient called and reports bright red blood when she wiped after urination. She was in the emergency room on 11/20/2021 due to urinary retention.  UA was checked which is negative for hematuria, nitrates, leukocytes.  Urine culture showed less than 10,000 colonies of insignificant growth.  Patient reports having no difficulty passing urine.  Recent hemoglobin was 10.  She is unclear where the blood is coming from. No dizziness, syncope, fever or chills.  She has constipation and just took MiraLAX an hour ago.  She has a history of hemorrhoids.  Recommend patient to watch her symptoms.  If symptom worsens, recommend ER evaluation.  Otherwise observation.  Recommend stool softener for constipation.  CC Dr. Cecille Aver to follow-up next week.

## 2021-11-24 ENCOUNTER — Inpatient Hospital Stay: Payer: No Typology Code available for payment source

## 2021-11-24 VITALS — BP 113/75 | HR 66 | Temp 97.9°F | Resp 17

## 2021-11-24 DIAGNOSIS — E860 Dehydration: Secondary | ICD-10-CM

## 2021-11-24 DIAGNOSIS — D709 Neutropenia, unspecified: Secondary | ICD-10-CM | POA: Diagnosis not present

## 2021-11-24 MED ORDER — SODIUM CHLORIDE 0.9 % IV SOLN
Freq: Once | INTRAVENOUS | Status: AC
  Filled 2021-11-24: qty 250

## 2021-11-24 MED ORDER — HEPARIN SOD (PORK) LOCK FLUSH 100 UNIT/ML IV SOLN
500.0000 [IU] | Freq: Once | INTRAVENOUS | Status: AC | PRN
  Administered 2021-11-24: 500 [IU]
  Filled 2021-11-24: qty 5

## 2021-11-24 MED ORDER — SODIUM CHLORIDE 0.9% FLUSH
10.0000 mL | Freq: Once | INTRAVENOUS | Status: AC | PRN
  Administered 2021-11-24: 10 mL
  Filled 2021-11-24: qty 10

## 2021-11-25 ENCOUNTER — Telehealth: Payer: Self-pay

## 2021-11-25 ENCOUNTER — Other Ambulatory Visit: Payer: Self-pay

## 2021-11-25 ENCOUNTER — Emergency Department: Payer: No Typology Code available for payment source

## 2021-11-25 ENCOUNTER — Inpatient Hospital Stay
Admission: EM | Admit: 2021-11-25 | Discharge: 2021-11-29 | DRG: 808 | Disposition: A | Discharge status: Other Acute Inpatient Hospital | Payer: No Typology Code available for payment source | Attending: Internal Medicine | Admitting: Internal Medicine

## 2021-11-25 DIAGNOSIS — Z853 Personal history of malignant neoplasm of breast: Secondary | ICD-10-CM

## 2021-11-25 DIAGNOSIS — D6959 Other secondary thrombocytopenia: Secondary | ICD-10-CM | POA: Diagnosis present

## 2021-11-25 DIAGNOSIS — D61810 Antineoplastic chemotherapy induced pancytopenia: Principal | ICD-10-CM | POA: Diagnosis present

## 2021-11-25 DIAGNOSIS — Z8249 Family history of ischemic heart disease and other diseases of the circulatory system: Secondary | ICD-10-CM

## 2021-11-25 DIAGNOSIS — E039 Hypothyroidism, unspecified: Secondary | ICD-10-CM | POA: Diagnosis present

## 2021-11-25 DIAGNOSIS — D701 Agranulocytosis secondary to cancer chemotherapy: Secondary | ICD-10-CM

## 2021-11-25 DIAGNOSIS — Z79899 Other long term (current) drug therapy: Secondary | ICD-10-CM

## 2021-11-25 DIAGNOSIS — Z7951 Long term (current) use of inhaled steroids: Secondary | ICD-10-CM

## 2021-11-25 DIAGNOSIS — R5383 Other fatigue: Principal | ICD-10-CM

## 2021-11-25 DIAGNOSIS — U099 Post covid-19 condition, unspecified: Secondary | ICD-10-CM | POA: Diagnosis present

## 2021-11-25 DIAGNOSIS — Z7901 Long term (current) use of anticoagulants: Secondary | ICD-10-CM

## 2021-11-25 DIAGNOSIS — Z955 Presence of coronary angioplasty implant and graft: Secondary | ICD-10-CM

## 2021-11-25 DIAGNOSIS — U071 COVID-19: Secondary | ICD-10-CM | POA: Diagnosis present

## 2021-11-25 DIAGNOSIS — C8330 Diffuse large B-cell lymphoma, unspecified site: Secondary | ICD-10-CM | POA: Diagnosis present

## 2021-11-25 DIAGNOSIS — I959 Hypotension, unspecified: Secondary | ICD-10-CM | POA: Diagnosis present

## 2021-11-25 DIAGNOSIS — T451X5A Adverse effect of antineoplastic and immunosuppressive drugs, initial encounter: Secondary | ICD-10-CM | POA: Diagnosis present

## 2021-11-25 DIAGNOSIS — Z79890 Hormone replacement therapy (postmenopausal): Secondary | ICD-10-CM

## 2021-11-25 DIAGNOSIS — D61818 Other pancytopenia: Secondary | ICD-10-CM | POA: Diagnosis present

## 2021-11-25 DIAGNOSIS — R531 Weakness: Secondary | ICD-10-CM | POA: Diagnosis not present

## 2021-11-25 DIAGNOSIS — Z9049 Acquired absence of other specified parts of digestive tract: Secondary | ICD-10-CM

## 2021-11-25 DIAGNOSIS — E119 Type 2 diabetes mellitus without complications: Secondary | ICD-10-CM | POA: Diagnosis present

## 2021-11-25 DIAGNOSIS — Z7952 Long term (current) use of systemic steroids: Secondary | ICD-10-CM

## 2021-11-25 DIAGNOSIS — K219 Gastro-esophageal reflux disease without esophagitis: Secondary | ICD-10-CM | POA: Diagnosis present

## 2021-11-25 DIAGNOSIS — Z885 Allergy status to narcotic agent status: Secondary | ICD-10-CM

## 2021-11-25 DIAGNOSIS — G4733 Obstructive sleep apnea (adult) (pediatric): Secondary | ICD-10-CM | POA: Diagnosis present

## 2021-11-25 DIAGNOSIS — I2510 Atherosclerotic heart disease of native coronary artery without angina pectoris: Secondary | ICD-10-CM | POA: Diagnosis present

## 2021-11-25 DIAGNOSIS — J45909 Unspecified asthma, uncomplicated: Secondary | ICD-10-CM | POA: Diagnosis present

## 2021-11-25 DIAGNOSIS — R627 Adult failure to thrive: Secondary | ICD-10-CM | POA: Diagnosis present

## 2021-11-25 DIAGNOSIS — E785 Hyperlipidemia, unspecified: Secondary | ICD-10-CM | POA: Diagnosis present

## 2021-11-25 DIAGNOSIS — Z923 Personal history of irradiation: Secondary | ICD-10-CM

## 2021-11-25 DIAGNOSIS — I10 Essential (primary) hypertension: Secondary | ICD-10-CM | POA: Diagnosis present

## 2021-11-25 DIAGNOSIS — I482 Chronic atrial fibrillation, unspecified: Secondary | ICD-10-CM | POA: Diagnosis present

## 2021-11-25 DIAGNOSIS — F32A Depression, unspecified: Secondary | ICD-10-CM | POA: Diagnosis present

## 2021-11-25 DIAGNOSIS — Z803 Family history of malignant neoplasm of breast: Secondary | ICD-10-CM

## 2021-11-25 LAB — LIPASE, BLOOD: Lipase: 19 U/L (ref 11–51)

## 2021-11-25 LAB — COMPREHENSIVE METABOLIC PANEL
ALT: 40 U/L (ref 0–44)
AST: 31 U/L (ref 15–41)
Albumin: 3.1 g/dL — ABNORMAL LOW (ref 3.5–5.0)
Alkaline Phosphatase: 94 U/L (ref 38–126)
Anion gap: 10 (ref 5–15)
BUN: 15 mg/dL (ref 8–23)
CO2: 27 mmol/L (ref 22–32)
Calcium: 9.1 mg/dL (ref 8.9–10.3)
Chloride: 101 mmol/L (ref 98–111)
Creatinine, Ser: 0.46 mg/dL (ref 0.44–1.00)
GFR, Estimated: >60 mL/min (ref >60)
Glucose, Bld: 121 mg/dL — ABNORMAL HIGH (ref 70–99)
Potassium: 3.4 mmol/L — ABNORMAL LOW (ref 3.5–5.1)
Sodium: 138 mmol/L (ref 135–145)
Total Bilirubin: 1.2 mg/dL (ref 0.3–1.2)
Total Protein: 5.7 g/dL — ABNORMAL LOW (ref 6.5–8.1)

## 2021-11-25 LAB — TROPONIN I (HIGH SENSITIVITY): Troponin I (High Sensitivity): 10 ng/L (ref <18)

## 2021-11-25 LAB — LACTIC ACID, PLASMA: Lactic Acid, Venous: 1.6 mmol/L (ref 0.5–1.9)

## 2021-11-25 MED ORDER — LACTATED RINGERS IV BOLUS
1000.0000 mL | Freq: Once | INTRAVENOUS | Status: AC
  Administered 2021-11-25: 1000 mL via INTRAVENOUS

## 2021-11-25 NOTE — ED Notes (Signed)
PAC to right chest accessed, blood return noted

## 2021-11-25 NOTE — ED Provider Notes (Signed)
[facility] Provider Note    Event Date/Time   First MD Initiated Contact with Patient 11/25/21 2256     (approximate)   History   Fatigue   HPI  Efrat A Pennings is a 76 y.o. female who presents to the ED for evaluation of Fatigue   I reviewed oncology clinic visit from 8/31.  History of diffuse large B-cell lymphoma.  Anticoagulated on Eliquis, HTN, HLD.  Undergoing R-CHOP chemotherapy, looks like he was provided on 8/31.  History of recurrent UTIs on prophylactic Levaquin.  Seen in the ED on 9/6 for urinary retention, no indications for Foley catheters and urine was not infectious at that time.  Discharged home.  Patient presents to the ED for generalized weakness, malaise and fatigue.  She reports this typically happens about a week after her infusions.  She finished a 5-day burst of prednisone yesterday.  Reports that she is taking Levaquin for antibiotic prophylaxis, but has not taken any today, last took on 9/5 in the evening.  Reports that she got IV fluid infusion at the cancer center yesterday and was up all night voiding that she reports is often typical with her IV fluids, denies any dysuria.  Does report some right-sided lower abdominal discomfort and suprapubic discomfort.  No fevers.  Physical Exam   Triage Vital Signs: ED Triage Vitals  Enc Vitals Group     BP 11/25/21 2240 (!) 62/45     Pulse Rate 11/25/21 2240 88     Resp 11/25/21 2240 20     Temp 11/25/21 2240 99.4 F (37.4 C)     Temp Source 11/25/21 2240 Oral     SpO2 11/25/21 2240 97 %     Weight 11/25/21 2244 163 lb (73.9 kg)     Height 11/25/21 2244 '5\' 2"'$  (1.575 m)     Head Circumference --      Peak Flow --      Pain Score 11/25/21 2244 1     Pain Loc --      Pain Edu? --      Excl. in [place]? --     Most recent vital signs: Vitals:   11/26/21 0328 11/26/21 0440  BP: 135/85   Pulse: 83   Resp: 16   Temp: 99.2 F (37.3 C) 98.8 F (37.1 C)  SpO2: 99%      General: Awake, no distress.  CV:  Good peripheral perfusion.  Resp:  Normal effort.  Abd:  No distention.  Mild tenderness to the suprapubic and RLQ abdomen. MSK:  No deformity noted.  Neuro:  No focal deficits appreciated. Other:     ED Results / Procedures / Treatments   Labs (all labs ordered are listed, but only abnormal results are displayed) Labs Reviewed  RESP PANEL BY RT-PCR (FLU A&B, COVID) ARPGX2 - Abnormal; Notable for the following components:      Result Value   SARS Coronavirus 2 by RT PCR POSITIVE (*)    All other components within normal limits  CBC WITH DIFFERENTIAL/PLATELET - Abnormal; Notable for the following components:   WBC 0.2 (*)    RBC 3.23 (*)    Hemoglobin 9.2 (*)    HCT 28.5 (*)    RDW 23.1 (*)    Platelets 24 (*)    Neutro Abs 0.0 (*)    Lymphs Abs 0.2 (*)    Monocytes Absolute 0.0 (*)    All other components within normal limits  URINALYSIS, ROUTINE W REFLEX  MICROSCOPIC - Abnormal; Notable for the following components:   Color, Urine YELLOW (*)    APPearance CLEAR (*)    Specific Gravity, Urine 1.004 (*)    pH 9.0 (*)    All other components within normal limits  COMPREHENSIVE METABOLIC PANEL - Abnormal; Notable for the following components:   Potassium 3.4 (*)    Glucose, Bld 121 (*)    Total Protein 5.7 (*)    Albumin 3.1 (*)    All other components within normal limits  CULTURE, BLOOD (ROUTINE X 2)  CULTURE, BLOOD (ROUTINE X 2)  LIPASE, BLOOD  LACTIC ACID, PLASMA  PROCALCITONIN  LACTIC ACID, PLASMA  CBC  TROPONIN I (HIGH SENSITIVITY)  TROPONIN I (HIGH SENSITIVITY)    EKG Sinus rhythm with a rate of 75 bpm.  Low voltage.  Normal axis and intervals.  No clear signs of acute ischemia.  RADIOLOGY CXR interpreted by me without evidence of acute cardiopulmonary pathology.  Official radiology report(s): DG Chest 1 View  Result Date: 11/25/2021 CLINICAL DATA:  Weakness, fatigue EXAM: CHEST  1 VIEW COMPARISON:  None  Available. FINDINGS: Lungs are clear. No pneumothorax or pleural effusion. Cardiac size within normal limits. Right internal jugular chest port tip seen within the superior cavoatrial junction. Pulmonary vascularity is normal. No acute bone abnormality. IMPRESSION: No radiographic evidence of acute cardiopulmonary disease. Electronically Signed   By: Fidela Salisbury M.D.   On: 11/25/2021 23:03    PROCEDURES and INTERVENTIONS:  Procedures  Medications  levofloxacin (LEVAQUIN) tablet 500 mg (has no administration in time range)  letrozole [facility]) tablet 2.5 mg (has no administration in time range)  amLODipine (NORVASC) tablet 10 mg (has no administration in time range)  atorvastatin (LIPITOR) tablet 40 mg (has no administration in time range)  isosorbide mononitrate (IMDUR) 24 hr tablet 30 mg (has no administration in time range)  nitroGLYCERIN (NITROSTAT) SL tablet 0.4 mg (has no administration in time range)  sotalol (BETAPACE) tablet 80 mg (has no administration in time range)  torsemide (DEMADEX) tablet 10 mg (has no administration in time range)  sertraline (ZOLOFT) tablet 50 mg (has no administration in time range)  levothyroxine (SYNTHROID) tablet 125 mcg (has no administration in time range)  famotidine (PEPCID) tablet 40 mg (has no administration in time range)  pantoprazole (PROTONIX) EC tablet 40 mg (has no administration in time range)  potassium chloride (KLOR-CON M) CR tablet 10 mEq (10 mEq Oral Given 11/26/21 0325)  Ipratropium-Albuterol (COMBIVENT) respimat 1 puff (has no administration in time range)  0.9 % NaCl with KCl 20 mEq/ L  infusion ( Intravenous New Bag/Given 11/26/21 0324)  acetaminophen (TYLENOL) tablet 650 mg (has no administration in time range)    Or  acetaminophen (TYLENOL) suppository 650 mg (has no administration in time range)  traZODone (DESYREL) tablet 25 mg (has no administration in time range)  magnesium hydroxide (MILK OF MAGNESIA) suspension 30 mL (has no  administration in time range)  ondansetron (ZOFRAN) tablet 4 mg (has no administration in time range)    Or  ondansetron (ZOFRAN) injection 4 mg (has no administration in time range)  ascorbic acid (VITAMIN C) tablet 500 mg (has no administration in time range)  zinc sulfate capsule 220 mg (has no administration in time range)  cholecalciferol (VITAMIN D3) 25 MCG (1000 UNIT) tablet 1,000 Units (has no administration in time range)  lactated ringers bolus 1,000 mL (0 mLs Intravenous Stopped 11/26/21 0204)  potassium chloride (KLOR-CON) packet 40 mEq (40 mEq Oral Given  11/26/21 0324)     IMPRESSION / MDM / ASSESSMENT AND PLAN / ED COURSE  I reviewed the triage vital signs and the nursing notes.  Differential diagnosis includes, but is not limited to, sepsis, dehydration, viral syndrome, neutropenic fever  {Patient presents with symptoms of an acute illness or injury that is potentially life-threatening.  76 year old female presents to the ED with generalized weakness after chemotherapy, with evidence of continued COVID-19 positivity, neutropenia, but no fever.  Ultimately requiring medical admission due to symptomatic nature.  She is noted to be hypotensive in triage, resolving with IV fluids without recurrence.  No fever on multiple rechecks.  Blood work with neutropenia with an B[place] of 0.  Urine without infectious features, negative troponins and negative procalcitonin which is suggestive of no bacterial etiology of her symptoms.  She still testing positive for COVID, which could be contributing to her symptoms.  CXR is clear.  We will consult with medicine for observation.  Clinical Course as of 11/26/21 0454  Thu Nov 26, 2021  0117 Reassessed and updated patient of work-up so far. [DS]  0157 Reassessed.  Patient reports feeling "okay".  We discussed leukopenia, continued COVID positivity but no other signs of infection or fever.  After discussing risks and benefits, she would like to stay for  observation in the hospital, which I think is very reasonable.  We will consult with medicine. [DS]    Clinical Course User Index [DS] Vladimir Crofts, MD     FINAL CLINICAL IMPRESSION(S) / ED DIAGNOSES   Final diagnoses:  None     Rx / DC Orders   ED Discharge Orders     None        Note:  This document was prepared using Dragon voice recognition software and may include unintentional dictation errors.   Vladimir Crofts, MD 11/26/21 [phone]

## 2021-11-25 NOTE — ED Triage Notes (Signed)
Pt presents to ER with c/o weakness and fatigue since receiving chemo on 8/31.  Pt endorses some increased n/v since that time as well.  Pt denies pain at this time.  Also states she has been running low grade fever for last hour.  Pt is A&O x4 at this time in NAD.

## 2021-11-25 NOTE — Telephone Encounter (Signed)
FMLA paperwork completed. Patient made aware. She has an appointment on 11/26/21 @ 10:30 and will pick up then. A copy made and put in scan.

## 2021-11-25 NOTE — ED Provider Triage Note (Signed)
Emergency Medicine Provider Triage Evaluation Note  Stacey Stone , a 76 y.o. female  was evaluated in triage. Has large B cell lymphoma, undergoing chemo. Pt complains of nausea since her treatment yesterday. Has right sided flank pain rated 1/10. No vomiting. Reports no appetite today. Had fever 1.5 hours ago, 99.41F. Hypotensive in triage  Review of Systems  Positive: Fever, fatigue, nausea Negative: Abd ain, chest pain  Physical Exam  There were no vitals taken for this visit. Gen:   Awake, no distress   Resp:  Normal effort  MSK:   Moves extremities without difficulty  Other:    Medical Decision Making  Medically screening exam initiated at 10:40 PM.  Appropriate orders placed.  Stacey Stone was informed that the remainder of the evaluation will be completed by another provider, this initial triage assessment does not replace that evaluation, and the importance of remaining in the ED until their evaluation is complete.     Marquette Old, PA-C 11/25/21 2246

## 2021-11-26 ENCOUNTER — Inpatient Hospital Stay: Payer: No Typology Code available for payment source

## 2021-11-26 ENCOUNTER — Observation Stay: Payer: No Typology Code available for payment source

## 2021-11-26 DIAGNOSIS — U071 COVID-19: Secondary | ICD-10-CM

## 2021-11-26 DIAGNOSIS — C8330 Diffuse large B-cell lymphoma, unspecified site: Secondary | ICD-10-CM

## 2021-11-26 DIAGNOSIS — I10 Essential (primary) hypertension: Secondary | ICD-10-CM | POA: Diagnosis present

## 2021-11-26 DIAGNOSIS — F32A Depression, unspecified: Secondary | ICD-10-CM | POA: Diagnosis present

## 2021-11-26 DIAGNOSIS — Z885 Allergy status to narcotic agent status: Secondary | ICD-10-CM | POA: Diagnosis not present

## 2021-11-26 DIAGNOSIS — Z803 Family history of malignant neoplasm of breast: Secondary | ICD-10-CM | POA: Diagnosis not present

## 2021-11-26 DIAGNOSIS — Z853 Personal history of malignant neoplasm of breast: Secondary | ICD-10-CM | POA: Diagnosis not present

## 2021-11-26 DIAGNOSIS — E039 Hypothyroidism, unspecified: Secondary | ICD-10-CM | POA: Diagnosis present

## 2021-11-26 DIAGNOSIS — I482 Chronic atrial fibrillation, unspecified: Secondary | ICD-10-CM | POA: Diagnosis present

## 2021-11-26 DIAGNOSIS — D61810 Antineoplastic chemotherapy induced pancytopenia: Secondary | ICD-10-CM | POA: Diagnosis present

## 2021-11-26 DIAGNOSIS — K219 Gastro-esophageal reflux disease without esophagitis: Secondary | ICD-10-CM | POA: Diagnosis present

## 2021-11-26 DIAGNOSIS — Z955 Presence of coronary angioplasty implant and graft: Secondary | ICD-10-CM | POA: Diagnosis not present

## 2021-11-26 DIAGNOSIS — D61818 Other pancytopenia: Secondary | ICD-10-CM

## 2021-11-26 DIAGNOSIS — R531 Weakness: Secondary | ICD-10-CM

## 2021-11-26 DIAGNOSIS — R627 Adult failure to thrive: Secondary | ICD-10-CM | POA: Diagnosis present

## 2021-11-26 DIAGNOSIS — J45909 Unspecified asthma, uncomplicated: Secondary | ICD-10-CM | POA: Diagnosis present

## 2021-11-26 DIAGNOSIS — Z923 Personal history of irradiation: Secondary | ICD-10-CM | POA: Diagnosis not present

## 2021-11-26 DIAGNOSIS — I959 Hypotension, unspecified: Secondary | ICD-10-CM | POA: Diagnosis present

## 2021-11-26 DIAGNOSIS — U099 Post covid-19 condition, unspecified: Secondary | ICD-10-CM | POA: Diagnosis present

## 2021-11-26 DIAGNOSIS — D6959 Other secondary thrombocytopenia: Secondary | ICD-10-CM | POA: Diagnosis present

## 2021-11-26 DIAGNOSIS — I2510 Atherosclerotic heart disease of native coronary artery without angina pectoris: Secondary | ICD-10-CM | POA: Diagnosis present

## 2021-11-26 DIAGNOSIS — Z9049 Acquired absence of other specified parts of digestive tract: Secondary | ICD-10-CM | POA: Diagnosis not present

## 2021-11-26 DIAGNOSIS — T451X5A Adverse effect of antineoplastic and immunosuppressive drugs, initial encounter: Secondary | ICD-10-CM | POA: Diagnosis present

## 2021-11-26 DIAGNOSIS — D701 Agranulocytosis secondary to cancer chemotherapy: Secondary | ICD-10-CM | POA: Diagnosis present

## 2021-11-26 DIAGNOSIS — E119 Type 2 diabetes mellitus without complications: Secondary | ICD-10-CM | POA: Diagnosis present

## 2021-11-26 DIAGNOSIS — E785 Hyperlipidemia, unspecified: Secondary | ICD-10-CM | POA: Diagnosis present

## 2021-11-26 LAB — CBC WITH DIFFERENTIAL/PLATELET
Abs Immature Granulocytes: 0 10*3/uL (ref 0.00–0.07)
Band Neutrophils: 0 %
Basophils Absolute: 0 10*3/uL (ref 0.0–0.1)
Basophils Relative: 9 %
Blasts: 0 %
Eosinophils Absolute: 0 10*3/uL (ref 0.0–0.5)
Eosinophils Relative: 4 %
HCT: 28.5 % — ABNORMAL LOW (ref 36.0–46.0)
Hemoglobin: 9.2 g/dL — ABNORMAL LOW (ref 12.0–15.0)
Immature Granulocytes: 0 %
Lymphocytes Relative: 83 %
Lymphs Abs: 0.2 10*3/uL — ABNORMAL LOW (ref 0.7–4.0)
MCH: 28.5 pg (ref 26.0–34.0)
MCHC: 32.3 g/dL (ref 30.0–36.0)
MCV: 88.2 fL (ref 80.0–100.0)
Metamyelocytes Relative: 0 %
Monocytes Absolute: 0 10*3/uL — ABNORMAL LOW (ref 0.1–1.0)
Monocytes Relative: 4 %
Myelocytes: 0 %
Neutro Abs: 0 10*3/uL — CL (ref 1.7–7.7)
Neutrophils Relative %: 0 %
Platelets: 24 10*3/uL — CL (ref 150–400)
Promyelocytes Relative: 0 %
RBC: 3.23 MIL/uL — ABNORMAL LOW (ref 3.87–5.11)
RDW: 23.1 % — ABNORMAL HIGH (ref 11.5–15.5)
Smear Review: NORMAL
WBC: 0.2 10*3/uL — CL (ref 4.0–10.5)
nRBC: 0 % (ref 0.0–0.2)
nRBC: 0 /100 WBC

## 2021-11-26 LAB — URINALYSIS, ROUTINE W REFLEX MICROSCOPIC
Bilirubin Urine: NEGATIVE
Glucose, UA: NEGATIVE mg/dL
Hgb urine dipstick: NEGATIVE
Ketones, ur: NEGATIVE mg/dL
Leukocytes,Ua: NEGATIVE
Nitrite: NEGATIVE
Protein, ur: NEGATIVE mg/dL
Specific Gravity, Urine: 1.004 — ABNORMAL LOW (ref 1.005–1.030)
pH: 9 — ABNORMAL HIGH (ref 5.0–8.0)

## 2021-11-26 LAB — CBC
HCT: 27.5 % — ABNORMAL LOW (ref 36.0–46.0)
Hemoglobin: 8.9 g/dL — ABNORMAL LOW (ref 12.0–15.0)
MCH: 28.6 pg (ref 26.0–34.0)
MCHC: 32.4 g/dL (ref 30.0–36.0)
MCV: 88.4 fL (ref 80.0–100.0)
Platelets: 21 10*3/uL — CL (ref 150–400)
RBC: 3.11 MIL/uL — ABNORMAL LOW (ref 3.87–5.11)
RDW: 23.2 % — ABNORMAL HIGH (ref 11.5–15.5)
WBC: 0.2 10*3/uL — CL (ref 4.0–10.5)
nRBC: 0 % (ref 0.0–0.2)

## 2021-11-26 LAB — PROCALCITONIN: Procalcitonin: <0.1 ng/mL

## 2021-11-26 LAB — RESP PANEL BY RT-PCR (FLU A&B, COVID) ARPGX2
Influenza A by PCR: NEGATIVE
Influenza B by PCR: NEGATIVE
SARS Coronavirus 2 by RT PCR: POSITIVE — AB

## 2021-11-26 LAB — TROPONIN I (HIGH SENSITIVITY): Troponin I (High Sensitivity): 9 ng/L (ref <18)

## 2021-11-26 LAB — LACTIC ACID, PLASMA: Lactic Acid, Venous: 1 mmol/L (ref 0.5–1.9)

## 2021-11-26 MED ORDER — VITAMIN D 25 MCG (1000 UNIT) PO TABS
1000.0000 [IU] | ORAL_TABLET | Freq: Every day | ORAL | Status: DC
  Administered 2021-11-26 – 2021-11-29 (×4): 1000 [IU] via ORAL
  Filled 2021-11-26 (×4): qty 1

## 2021-11-26 MED ORDER — PANTOPRAZOLE SODIUM 40 MG PO TBEC
40.0000 mg | DELAYED_RELEASE_TABLET | Freq: Every day | ORAL | Status: DC
  Administered 2021-11-26 – 2021-11-29 (×4): 40 mg via ORAL
  Filled 2021-11-26 (×4): qty 1

## 2021-11-26 MED ORDER — LETROZOLE 2.5 MG PO TABS
2.5000 mg | ORAL_TABLET | Freq: Every day | ORAL | Status: DC
  Administered 2021-11-26 – 2021-11-29 (×4): 2.5 mg via ORAL
  Filled 2021-11-26 (×4): qty 1

## 2021-11-26 MED ORDER — LEVOTHYROXINE SODIUM 50 MCG PO TABS
125.0000 ug | ORAL_TABLET | Freq: Every day | ORAL | Status: DC
  Administered 2021-11-26 – 2021-11-29 (×4): 125 ug via ORAL
  Filled 2021-11-26: qty 3
  Filled 2021-11-26 (×3): qty 1

## 2021-11-26 MED ORDER — ZINC SULFATE 220 (50 ZN) MG PO CAPS
220.0000 mg | ORAL_CAPSULE | Freq: Every day | ORAL | Status: DC
  Administered 2021-11-26 – 2021-11-29 (×4): 220 mg via ORAL
  Filled 2021-11-26 (×4): qty 1

## 2021-11-26 MED ORDER — ACETAMINOPHEN 325 MG PO TABS: 650.0000 mg | ORAL_TABLET | Freq: Four times a day (QID) | ORAL | Status: DC | PRN

## 2021-11-26 MED ORDER — ONDANSETRON HCL 4 MG/2ML IJ SOLN
4.0000 mg | Freq: Four times a day (QID) | INTRAMUSCULAR | Status: DC | PRN
  Administered 2021-11-27: 4 mg via INTRAVENOUS
  Filled 2021-11-26: qty 2

## 2021-11-26 MED ORDER — POTASSIUM CHLORIDE 20 MEQ PO PACK
40.0000 meq | PACK | Freq: Once | ORAL | Status: AC
  Administered 2021-11-26: 40 meq via ORAL
  Filled 2021-11-26: qty 2

## 2021-11-26 MED ORDER — TORSEMIDE 20 MG PO TABS: 10.0000 mg | ORAL_TABLET | ORAL | Status: DC

## 2021-11-26 MED ORDER — TRAZODONE HCL 50 MG PO TABS: 25.0000 mg | ORAL_TABLET | Freq: Every evening | ORAL | Status: DC | PRN

## 2021-11-26 MED ORDER — ACETAMINOPHEN 650 MG RE SUPP: 650.0000 mg | Freq: Four times a day (QID) | RECTAL | Status: DC | PRN

## 2021-11-26 MED ORDER — MAGNESIUM HYDROXIDE 400 MG/5ML PO SUSP
30.0000 mL | Freq: Every day | ORAL | Status: DC | PRN
Start: 2021-11-26 — End: 2021-11-29

## 2021-11-26 MED ORDER — AMLODIPINE BESYLATE 5 MG PO TABS
10.0000 mg | ORAL_TABLET | Freq: Every day | ORAL | Status: DC
  Administered 2021-11-26: 10 mg via ORAL
  Filled 2021-11-26: qty 2

## 2021-11-26 MED ORDER — SOTALOL HCL 80 MG PO TABS
80.0000 mg | ORAL_TABLET | Freq: Two times a day (BID) | ORAL | Status: DC
  Administered 2021-11-26 – 2021-11-29 (×7): 80 mg via ORAL
  Filled 2021-11-26 (×7): qty 1

## 2021-11-26 MED ORDER — ISOSORBIDE MONONITRATE ER 60 MG PO TB24
30.0000 mg | ORAL_TABLET | Freq: Every day | ORAL | Status: DC
  Administered 2021-11-26: 30 mg via ORAL
  Filled 2021-11-26: qty 1

## 2021-11-26 MED ORDER — POTASSIUM CHLORIDE IN NACL 20-0.9 MEQ/L-% IV SOLN
INTRAVENOUS | Status: DC
  Filled 2021-11-26 (×6): qty 1000

## 2021-11-26 MED ORDER — LEVOFLOXACIN 500 MG PO TABS
500.0000 mg | ORAL_TABLET | Freq: Every day | ORAL | Status: DC
  Administered 2021-11-26 – 2021-11-28 (×3): 500 mg via ORAL
  Filled 2021-11-26 (×3): qty 1

## 2021-11-26 MED ORDER — FAMOTIDINE 20 MG PO TABS
40.0000 mg | ORAL_TABLET | Freq: Every day | ORAL | Status: DC
  Administered 2021-11-26 – 2021-11-28 (×3): 40 mg via ORAL
  Filled 2021-11-26 (×3): qty 2

## 2021-11-26 MED ORDER — SERTRALINE HCL 50 MG PO TABS
50.0000 mg | ORAL_TABLET | Freq: Every day | ORAL | Status: DC
  Administered 2021-11-26 – 2021-11-28 (×3): 50 mg via ORAL
  Filled 2021-11-26 (×3): qty 1

## 2021-11-26 MED ORDER — ONDANSETRON HCL 4 MG PO TABS
4.0000 mg | ORAL_TABLET | Freq: Four times a day (QID) | ORAL | Status: DC | PRN
  Administered 2021-11-26 – 2021-11-29 (×2): 4 mg via ORAL
  Filled 2021-11-26 (×2): qty 1

## 2021-11-26 MED ORDER — POTASSIUM CHLORIDE CRYS ER 10 MEQ PO TBCR
10.0000 meq | EXTENDED_RELEASE_TABLET | Freq: Two times a day (BID) | ORAL | Status: DC
  Administered 2021-11-26 – 2021-11-29 (×8): 10 meq via ORAL
  Filled 2021-11-26 (×8): qty 1

## 2021-11-26 MED ORDER — ATORVASTATIN CALCIUM 20 MG PO TABS
40.0000 mg | ORAL_TABLET | Freq: Every day | ORAL | Status: DC
  Administered 2021-11-26 – 2021-11-28 (×3): 40 mg via ORAL
  Filled 2021-11-26 (×3): qty 2

## 2021-11-26 MED ORDER — NITROGLYCERIN 0.4 MG SL SUBL: 0.4000 mg | SUBLINGUAL_TABLET | SUBLINGUAL | Status: DC | PRN

## 2021-11-26 MED ORDER — IPRATROPIUM-ALBUTEROL 20-100 MCG/ACT IN AERS
1.0000 | INHALATION_SPRAY | Freq: Four times a day (QID) | RESPIRATORY_TRACT | Status: DC | PRN
Start: 2021-11-26 — End: 2021-11-29

## 2021-11-26 MED ORDER — VITAMIN C 500 MG PO TABS
500.0000 mg | ORAL_TABLET | Freq: Every day | ORAL | Status: DC
  Administered 2021-11-26 – 2021-11-29 (×4): 500 mg via ORAL
  Filled 2021-11-26 (×4): qty 1

## 2021-11-26 NOTE — Assessment & Plan Note (Signed)
-   The patient has been getting R-CHOP regimen and her last session was 6 days ago.

## 2021-11-26 NOTE — ED Notes (Signed)
Pt feeling nauseated and dry heaving, given blue bag and zofran

## 2021-11-26 NOTE — Plan of Care (Addendum)
76 year old female with history of diffuse large B-cell lymphoma status post chemo 6 days prior to admission admitted with generalized weakness and pancytopenia with severe neutropenia and ANC of 0.  In addition to this patient has extensive past medical history significant for prolonged COVID, chronic atrial fibrillation, CAD, type 2 diabetes, hypertension, hypothyroidism, obstructive sleep apnea, depression, asthma, osteoarthritis.  At baseline she is able to take care of herself and do all her ADLs and lives at home.  She is so weak that she was unable to do anything for herself or ambulate or take care of ADLs at home. She has been placed on IV fluids and Levaquin prophylactically. Oncology consulted. Potassium 3.4 repleted Mag pending Lactic acid 1.6 Procalcitonin less than 0.10 Patient was seen in the ER resting/sleep being.  I was able to wake her up and she was able to have a little conversation due to generalized weakness and fatigue. Holding torsemide due to soft BP very poor p.o. intake with nausea.  Continue IV fluids. Holding Norvasc and Imdur

## 2021-11-26 NOTE — Consult Note (Signed)
[place]  [phone] [phone]) [phone]  ID: Stacey Stone OB: 10/09/1945  MR#: [id number]  CSN#:[id number]  Patient Care Team: Rusty Aus, MD as PCP - General (Internal Medicine) Isaias Cowman, MD as Consulting Physician (Cardiology) Lloyd Huger, MD as Consulting Physician (Oncology)  CHIEF COMPLAINT: Pancytopenia secondary to chemotherapy.  Fatigue.  INTERVAL HISTORY: Patient is a 76 year old female who is actively receiving chemotherapy for underlying diffuse large B-cell lymphoma.  Her last treatment was approximately 1 week ago.  She presented to the emergency room with increasing weakness and fatigue and a poor appetite.  She denies any fevers.  She has no neurologic complaints.  She does not report any weight loss.  She has no chest pain, shortness of breath, cough, or hemoptysis.  She denies any nausea, vomiting, constipation, or diarrhea.  She has no urinary complaints.  Patient feels generally terrible, but offers no further specific complaints today.  REVIEW OF SYSTEMS:   Review of Systems  Constitutional:  Positive for malaise/fatigue. Negative for fever and weight loss.  Respiratory: Negative.  Negative for cough, hemoptysis and shortness of breath.   Cardiovascular: Negative.  Negative for chest pain and leg swelling.  Gastrointestinal: Negative.  Negative for abdominal pain.  Genitourinary: Negative.  Negative for dysuria.  Musculoskeletal: Negative.  Negative for back pain.  Skin: Negative.  Negative for rash.  Neurological:  Positive for weakness. Negative for dizziness, focal weakness and headaches.  Psychiatric/Behavioral: Negative.  The patient is not nervous/anxious.     As per HPI. Otherwise, a complete review of systems is negative.  PAST MEDICAL HISTORY: Past Medical History:  Diagnosis Date   Abdominal pain, left lower quadrant    epiploic appendagitis   Anginal pain ([place])    Arthritis  06/06/2018   knee   Asthma    mild   Atrial fibrillation (HCC)    Breast cancer ([place])    Coronary artery disease 06/06/2018   1 artery 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 Mamie Nick. 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   No pertinent past medical history    Pancytopenia ([place])    Personal history of radiation therapy    Sleep apnea 06/06/2018   O2- 2l at bedtime and BiPap @ bedtime    PAST SURGICAL HISTORY: Past Surgical History:  Procedure Laterality Date   BREAST BIOPSY Left 2/136/2018   INVASIVE MAMMARY CARCINOMA   BREAST BIOPSY Right 05/25/2016   FIBROCYSTIC CHANGE WITH CALCIFICATIONS    BREAST BIOPSY Right 05/29/2020   Affirm bx-"X" clip-FIBROADENOMA WITH ASSOCIATED CALCIFICATIONS. - NEGATIVE   BREAST EXCISIONAL BIOPSY Left 06/04/2016   INVASIVE MAMMARY CARCINOMA.    BREAST LUMPECTOMY Left 06/04/2016   INVASIVE MAMMARY CARCINOMA.    CARDIAC CATHETERIZATION     CARDIAC CATHETERIZATION N/A 09/24/2015   Procedure: Left Heart Cath and Coronary Angiography;  Surgeon: Isaias Cowman, MD;  Location: [facility];  Service: Cardiovascular;  Laterality: N/A;   CARDIAC CATHETERIZATION N/A 09/24/2015   Procedure: Coronary Stent Intervention;  Surgeon: Isaias Cowman, MD;  Location: [facility];  Service: Cardiovascular;  Laterality: N/A;   CHOLECYSTECTOMY  11/26   08/1970   COLONOSCOPY WITH PROPOFOL N/A 07/08/2021   Procedure: COLONOSCOPY WITH  PROPOFOL;  Surgeon: Toledo, Benay Pike, MD;  Location: [facility];  Service: Gastroenterology;  Laterality: N/A;   DILATATION & CURETTAGE/HYSTEROSCOPY WITH MYOSURE N/A 05/05/2015   Procedure: Hysteroscopy and endometrial curretage;  Surgeon: Boykin Nearing, MD;   Location: [facility];  Service: Gynecology;  Laterality: N/A;   DILATION AND CURETTAGE OF UTERUS     EXCISION MASS FROM NECK  Right    Dr. Tami Ribas   EYE SURGERY  05/29/2020   bil cataract 9/08   HAMMER TOE SURGERY Right    IR BONE MARROW BIOPSY & ASPIRATION  09/09/2021   IR IMAGING GUIDED PORT INSERTION  09/09/2021   JOINT REPLACEMENT Bilateral 2008, 11/26/ 2012   Partial Knee Replacements   LEFT HEART CATH AND CORONARY ANGIOGRAPHY N/A 10/25/2017   Procedure: LEFT HEART CATH AND CORONARY ANGIOGRAPHY;  Surgeon: Teodoro Spray, MD;  Location: [facility];  Service: Cardiovascular;  Laterality: N/A;   LEFT HEART CATH AND CORONARY ANGIOGRAPHY N/A 10/26/2017   Procedure: LEFT HEART CATH AND CORONARY ANGIOGRAPHY;  Surgeon: Isaias Cowman, MD;  Location: [facility];  Service: Cardiovascular;  Laterality: N/A;   PARTIAL MASTECTOMY WITH NEEDLE LOCALIZATION Left 06/04/2016   Procedure: PARTIAL MASTECTOMY WITH NEEDLE LOCALIZATION;  Surgeon: Leonie Green, MD;  Location: [facility];  Service: General;  Laterality: Left;   SCLERAL BUCKLE  02/16/2011   Procedure: SCLERAL BUCKLE;  Surgeon: Hayden Pedro, MD;  Location: [facility];  Service: Ophthalmology;  Laterality: Left;  Scleral Buckle Left Eye with Headscope Laser   SENTINEL NODE BIOPSY Left 06/04/2016   Procedure: SENTINEL NODE BIOPSY;  Surgeon: Leonie Green, MD;  Location: [facility];  Service: General;  Laterality: Left;    FAMILY HISTORY: Family History  Problem Relation Age of Onset   Breast cancer Mother 44   Heart disease Father    Breast cancer Paternal Aunt 55   Breast cancer Cousin    Anesthesia problems Neg Hx    Hypotension Neg Hx    Malignant hyperthermia Neg Hx    Pseudochol deficiency Neg Hx     ADVANCED DIRECTIVES (Y/N):  _0 @  HEALTH MAINTENANCE: Social History   Tobacco Use   Smoking status: Never   Smokeless tobacco: Never  Vaping Use   Vaping Use: Never used  Substance Use Topics    Alcohol use: No   Drug use: No     Colonoscopy:  PAP:  Bone density:  Lipid panel:  Allergies  Allergen Reactions   Codeine Other (See Comments)    Stroke like symptoms   Tramadol Itching    Current Facility-Administered Medications  Medication Dose Route Frequency Provider Last Rate Last Admin   0.9 % NaCl with KCl 20 mEq/ L  infusion   Intravenous Continuous Mansy, Jan A, MD 100 mL/hr at 11/26/21 1435 New Bag at 11/26/21 1435   acetaminophen (TYLENOL) tablet 650 mg  650 mg Oral Q6H PRN Mansy, Jan A, MD       Or   acetaminophen (TYLENOL) suppository 650 mg  650 mg Rectal Q6H PRN Mansy, Jan A, MD       ascorbic acid (VITAMIN C) tablet 500 mg  500 mg Oral Daily Mansy, Jan A, MD   500 mg at 11/26/21 0914   atorvastatin (LIPITOR) tablet 40 mg  40 mg Oral QHS Mansy, Jan A, MD       cholecalciferol (VITAMIN D3) 25 MCG (1000 UNIT) tablet 1,000 Units  1,000 Units Oral Daily Mansy, Arvella Merles, MD  1,000 Units at 11/26/21 0915   famotidine (PEPCID) tablet 40 mg  40 mg Oral QHS Mansy, Jan A, MD       Ipratropium-Albuterol (COMBIVENT) respimat 1 puff  1 puff Inhalation QID PRN Mansy, Jan A, MD       letrozole [facility]) tablet 2.5 mg  2.5 mg Oral Daily Mansy, Jan A, MD   2.5 mg at 11/26/21 0917   levofloxacin (LEVAQUIN) tablet 500 mg  500 mg Oral Q breakfast Mansy, Jan A, MD   500 mg at 11/26/21 0759   levothyroxine (SYNTHROID) tablet 125 mcg  125 mcg Oral Q0600 Mansy, Jan A, MD   125 mcg at 11/26/21 0759   magnesium hydroxide (MILK OF MAGNESIA) suspension 30 mL  30 mL Oral Daily PRN Mansy, Jan A, MD       nitroGLYCERIN (NITROSTAT) SL tablet 0.4 mg  0.4 mg Sublingual Q5 min PRN Mansy, Jan A, MD       ondansetron [facility]) tablet 4 mg  4 mg Oral Q6H PRN Mansy, Jan A, MD   4 mg at 11/26/21 1014   Or   ondansetron (ZOFRAN) injection 4 mg  4 mg Intravenous Q6H PRN Mansy, Jan A, MD       pantoprazole (PROTONIX) EC tablet 40 mg  40 mg Oral Daily Mansy, Jan A, MD   40 mg at 11/26/21 0914   potassium  chloride (KLOR-CON M) CR tablet 10 mEq  10 mEq Oral BID Mansy, Jan A, MD   10 mEq at 11/26/21 0916   sertraline (ZOLOFT) tablet 50 mg  50 mg Oral QHS Mansy, Jan A, MD       sotalol (BETAPACE) tablet 80 mg  80 mg Oral BID Mansy, Jan A, MD   80 mg at 11/26/21 0800   traZODone (DESYREL) tablet 25 mg  25 mg Oral QHS PRN Mansy, Jan A, MD       zinc sulfate capsule 220 mg  220 mg Oral Daily Mansy, Jan A, MD   220 mg at 11/26/21 0915   Current Outpatient Medications  Medication Sig Dispense Refill   amLODipine (NORVASC) 10 MG tablet Take 1 tablet (10 mg total) by mouth daily. 30 tablet 0   apixaban (ELIQUIS) 5 MG TABS tablet Take 1 tablet (5 mg total) by mouth 2 (two) times daily. START taking it form 11/18/2021 60 tablet 1   Fluticasone-Salmeterol (ADVAIR) 100-50 MCG/DOSE AEPB Inhale 1 puff into the lungs 2 (two) times daily as needed (for asthma.).     Ipratropium-Albuterol (COMBIVENT) 20-100 MCG/ACT AERS respimat Inhale 1 puff into the lungs 4 (four) times daily as needed for wheezing. 1 each 1   isosorbide mononitrate (IMDUR) 30 MG 24 hr tablet Take 30 mg by mouth daily.     levofloxacin (LEVAQUIN) 500 MG tablet Take 1 tablet (500 mg total) by mouth daily for 14 days. 14 tablet 0   levothyroxine (SYNTHROID) 125 MCG tablet Take 125 mcg by mouth daily.     magic mouthwash (nystatin, hydrocortisone, diphenhydrAMINE) suspension Swish and spit 5 mLs 3 (three) times daily as needed for mouth pain. 540 mL 0   nitroGLYCERIN (NITROSTAT) 0.4 MG SL tablet Place 0.4 mg under the tongue every 5 (five) minutes as needed for chest pain. Maximum 3 doses, If no relief call md or 911.     potassium chloride (KLOR-CON) 10 MEQ tablet Take 1 tablet (10 mEq total) by mouth 2 (two) times daily.     RABEprazole (ACIPHEX) 20 MG tablet Take 20  mg by mouth daily.     sotalol (BETAPACE) 80 MG tablet Take 80 mg by mouth 2 (two) times daily.     torsemide (DEMADEX) 20 MG tablet Take 10 mg by mouth as directed. Take on Tuesday and  Friday     acetaminophen (TYLENOL) 500 MG tablet Take 500 mg by mouth every 4 (four) hours as needed.     atorvastatin (LIPITOR) 40 MG tablet Take 1 tablet (40 mg total) by mouth at bedtime.     famotidine (PEPCID) 40 MG tablet Take 40 mg by mouth at bedtime.     letrozole (FEMARA) 2.5 MG tablet TAKE 1 TABLET BY MOUTH ONCE DAILY (Patient not taking: Reported on 11/26/2021) 90 tablet 3   predniSONE (DELTASONE) 20 MG tablet Take 100 mg by mouth daily. Take with food on days 1-5 of chemotherapy. (Patient not taking: Reported on 11/26/2021)     sertraline (ZOLOFT) 50 MG tablet Take 50 mg by mouth at bedtime.     Facility-Administered Medications Ordered in Other Encounters  Medication Dose Route Frequency Provider Last Rate Last Admin   acetaminophen (TYLENOL) 325 MG tablet            diphenhydrAMINE (BENADRYL) 25 mg capsule            heparin lock flush 100 UNIT/ML injection            heparin lock flush 100 UNIT/ML injection            palonosetron (ALOXI) 0.25 MG/5ML injection             OBJECTIVE: Vitals:   11/26/21 1400 11/26/21 1430  BP: 121/78 106/73  Pulse:    Resp: 16 18  Temp:    SpO2:       Body mass index is 29.81 kg/m.    ECOG FS:3 - Symptomatic, >50% confined to bed  General: Well-developed, well-nourished, no acute distress. Eyes: Pink conjunctiva, anicteric sclera. HEENT: Normocephalic, moist mucous membranes. Lungs: No audible wheezing or coughing. Heart: Regular rate and rhythm. Abdomen: Soft, nontender, no obvious distention. Musculoskeletal: No edema, cyanosis, or clubbing. Neuro: Alert, answering all questions appropriately. Cranial nerves grossly intact. Skin: No rashes or petechiae noted. Psych: Normal affect. Lymphatics: No cervical, calvicular, axillary or inguinal LAD.   LAB RESULTS:  Lab Results  Component Value Date   NA 138 11/25/2021   K 3.4 (L) 11/25/2021   CL 101 11/25/2021   CO2 27 11/25/2021   GLUCOSE 121 (H) 11/25/2021   BUN 15 11/25/2021    CREATININE 0.46 11/25/2021   CALCIUM 9.1 11/25/2021   PROT 5.7 (L) 11/25/2021   ALBUMIN 3.1 (L) 11/25/2021   AST 31 11/25/2021   ALT 40 11/25/2021   ALKPHOS 94 11/25/2021   BILITOT 1.2 11/25/2021   GFRNONAA >60 11/25/2021   GFRAA 59 (L) 06/14/2019    Lab Results  Component Value Date   WBC 0.2 (LL) 11/26/2021   NEUTROABS 0.0 (LL) 11/25/2021   HGB 8.9 (L) 11/26/2021   HCT 27.5 (L) 11/26/2021   MCV 88.4 11/26/2021   PLT 21 (LL) 11/26/2021     STUDIES: DG Abd 1 View  Result Date: 11/26/2021 CLINICAL DATA:  Nausea and vomiting today. EXAM: ABDOMEN - 1 VIEW COMPARISON:  None Available. FINDINGS: Bowel gas pattern is nonobstructive. Average stool burden. No organomegaly or abnormal calcifications. IMPRESSION: No evidence for acute  abnormality. Electronically Signed   By: Nolon Nations M.D.   On: 11/26/2021 13:30   DG Chest 1 View  Result Date: 11/25/2021 CLINICAL DATA:  Weakness, fatigue EXAM: CHEST  1 VIEW COMPARISON:  None Available. FINDINGS: Lungs are clear. No pneumothorax or pleural effusion. Cardiac size within normal limits. Right internal jugular chest port tip seen within the superior cavoatrial junction. Pulmonary vascularity is normal. No acute bone abnormality. IMPRESSION: No radiographic evidence of acute cardiopulmonary disease. Electronically Signed   By: Fidela Salisbury M.D.   On: 11/25/2021 23:03   DG Chest Port 1 View  Result Date: 11/08/2021 CLINICAL DATA:  Follow-up pneumonia. EXAM: PORTABLE CHEST 1 VIEW COMPARISON:  11/06/2021 FINDINGS: There is a right chest wall port a catheter with tip at the cavoatrial junction. Heart size appears normal scratch set stable cardiomediastinal contours. No pleural effusion or edema. Streaky peribronchial opacities within the left base identified which appears stable to slightly increased from previous exam. IMPRESSION: Streaky peribronchial opacities within the left base, stable to slightly increased from previous exam.  Electronically Signed   By: Kerby Moors M.D.   On: 11/08/2021 07:09   DG Chest Port 1 View  Result Date: 11/06/2021 CLINICAL DATA:  Questionable sepsis - evaluate for abnormality EXAM: PORTABLE CHEST 1 VIEW COMPARISON:  Chest x-ray 09/23/2021, CT chest 11/16/2016 FINDINGS: Accessed right Port-A-Cath with tip overlying the distal superior vena cava. The heart and mediastinal contours are unchanged. Atherosclerotic plaque. No focal consolidation. No pulmonary edema. No pleural effusion. No pneumothorax. No acute osseous abnormality. IMPRESSION: 1. No active disease. 2. Aortic Atherosclerosis (ICD10-I70.0). Electronically Signed   By: Iven Finn M.D.   On: 11/06/2021 20:59    ASSESSMENT: Pancytopenia secondary to chemotherapy.  Fatigue.  PLAN:    Diffuse large B-cell lymphoma: Patient last received chemotherapy with R-CHOP approximately 1 week ago.  She also received Udenyca with her treatments.  Patient has a PET scan at the end of the month followed by cycle 5 of chemotherapy and she has been instructed to keep these appointments as previously scheduled. Neutropenia: Secondary to chemotherapy.  Patient is currently afebrile.  No intervention needed.  Patient received Udenyca last week as above.  Expect her white blood cell count to begin to trend up in the next several days. Thrombocytopenia: Secondary to chemotherapy.  Monitor.  Patient's platelets have trended down overnight recommend 1 unit platelets today.   Anemia: Secondary to chemotherapy.  Patient's hemoglobin trended down to 7.7 overnight.  Okay to give 1 unit packed red blood cells today. Weakness, fatigue, failure to thrive: Also due to chemotherapy.  Continue to monitor. Disposition: Okay to discharge when patient's performance status improves.  Unless she has a fever, do not need to delay discharge based on blood counts.  Appreciate consult, will follow.    Lloyd Huger, MD   11/26/2021 3:41 PM

## 2021-11-26 NOTE — ED Notes (Signed)
Pt stating not hungry for lunch at this time, told her to let me know if she changes her mind.

## 2021-11-26 NOTE — Assessment & Plan Note (Signed)
-   We will continue antihypertensives. 

## 2021-11-26 NOTE — Assessment & Plan Note (Signed)
-   The patient will be admitted to a medical telemetry bed. - This is likely secondary to her pancytopenia due to recent chemotherapy. - We will obtain an oncology consult. - I notified Dr. Janese Banks about the patient.

## 2021-11-26 NOTE — Assessment & Plan Note (Signed)
-   We will continue Synthroid. 

## 2021-11-26 NOTE — Assessment & Plan Note (Addendum)
-   This has been positive for a while, including 9/6, 8/18 and 7/5, likely secondary to her severe neutropenia. - We will defer any management to the oncologist's discretion at this time.

## 2021-11-26 NOTE — Assessment & Plan Note (Signed)
-   We will continue prophylactic Levaquin for now. -Oncology consult will be obtained as mentioned above.

## 2021-11-26 NOTE — Plan of Care (Signed)
  Problem: Pain Managment: Goal: General experience of comfort will improve Outcome: Progressing   Problem: Safety: Goal: Ability to remain free from injury will improve Outcome: Progressing   Problem: Skin Integrity: Goal: Risk for impaired skin integrity will decrease Outcome: Progressing   

## 2021-11-26 NOTE — Progress Notes (Signed)
Pt arrived to room 214 via stretcher from the ED. Received report from Meta, Therapist, sports. See assessment. Will continue to monitor.

## 2021-11-26 NOTE — H&P (Addendum)
[place]   PATIENT NAME: Stacey Stone    MR#:  [id number]  DATE OF BIRTH:  01/31/1946  DATE OF ADMISSION:  11/25/2021  PRIMARY CARE PHYSICIAN: Rusty Aus, MD   Patient is coming from: Home  REQUESTING/REFERRING PHYSICIAN: Vladimir Crofts, MD  CHIEF COMPLAINT:   Chief Complaint  Patient presents with  . Fatigue    HISTORY OF PRESENT ILLNESS:  Stacey Stone is a 76 y.o. Caucasian female with medical history significant for osteoarthritis, asthma, atrial fibrillation, coronary artery disease, depression, type 2 diabetes mellitus, GERD, hypertension, hypothyroidism and obstructive sleep apnea, who presented to the emergency room with acute onset of generalized weakness, malaise and fatigue.  No fever or chills.  No nausea or vomiting or diarrhea or abdominal pain.  No dysuria, oliguria, hematuria urgency or frequency or flank pain.  No headache or dizziness or blurred vision.  She has had positive COVID test for a while now.  No loss of taste or smell.  No chest pain or palpitations.  No cough or wheezing.  She has been having mild dyspnea on exertion.  Her last chemotherapy session was 6 days ago and that was her fourth session of RCHOP regimen.  ED Course: Upon presentation to the emergency room, vital signs were within normal.  Labs revealed potassium of 3.4 and given 3.1 with total protein of 5.7 with otherwise unremarkable CMP.  High-sensitivity troponin was 10 and later 9.  Lactic acid was 1.6 and procalcitonin was less than 0.1.  CBC showed leuk pia 0.2 and ANC level 0.  Hemoglobin was 9.2 and hematocrit 28.5 slightly lower than previous levels and platelets 24 down from 181 on 9/1 EKG as reviewed by me : EKG showed normal sinus rhythm with rate of 75 with low voltage QRS and T wave inversion anteroseptally. Imaging: Portable chest x-ray showed no acute cardiopulmonary disease.  The patient was given 1 L bolus of IV lactated Ringer.  She will be admitted to a  medical telemetry bed for further evaluation and management. PAST MEDICAL HISTORY:   Past Medical History:  Diagnosis Date  . Abdominal pain, left lower quadrant    epiploic appendagitis  . Anginal pain ([place])   . Arthritis 06/06/2018   knee  . Asthma    mild  . Atrial fibrillation ([place])   . Breast cancer ([place])   . Coronary artery disease 06/06/2018   1 artery 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 Mamie Nick. at [facility] in [place]  . Depression   . Diabetes mellitus without complication ([place])   . Dyspnea   . Dysrhythmia   . GERD (gastroesophageal reflux disease)   . Hypertension   . Hypothyroidism   . 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  Marland Kitchen No pertinent past medical history   . Pancytopenia ([place])   . Personal history of radiation therapy   . Sleep apnea 06/06/2018   O2- 2l at bedtime and BiPap @ bedtime    PAST SURGICAL HISTORY:   Past Surgical History:  Procedure Laterality Date  . BREAST BIOPSY Left 2/136/2018   INVASIVE MAMMARY CARCINOMA  . BREAST BIOPSY Right 05/25/2016   FIBROCYSTIC CHANGE WITH CALCIFICATIONS   . BREAST BIOPSY Right 05/29/2020   Affirm bx-"X" clip-FIBROADENOMA WITH ASSOCIATED CALCIFICATIONS. - NEGATIVE  . BREAST EXCISIONAL BIOPSY Left 06/04/2016   INVASIVE MAMMARY CARCINOMA.   Marland Kitchen BREAST LUMPECTOMY Left 06/04/2016   INVASIVE MAMMARY CARCINOMA.   Marland Kitchen CARDIAC CATHETERIZATION    .  CARDIAC CATHETERIZATION N/A 09/24/2015   Procedure: Left Heart Cath and Coronary Angiography;  Surgeon: Isaias Cowman, MD;  Location: [facility];  Service: Cardiovascular;  Laterality: N/A;  . CARDIAC CATHETERIZATION N/A 09/24/2015   Procedure: Coronary Stent Intervention;  Surgeon: Isaias Cowman, MD;  Location: [facility];  Service: Cardiovascular;  Laterality: N/A;  . CHOLECYSTECTOMY  11/26   08/1970  . COLONOSCOPY WITH PROPOFOL N/A 07/08/2021   Procedure: COLONOSCOPY WITH PROPOFOL;   Surgeon: Toledo, Benay Pike, MD;  Location: [facility];  Service: Gastroenterology;  Laterality: N/A;  . DILATATION & CURETTAGE/HYSTEROSCOPY WITH MYOSURE N/A 05/05/2015   Procedure: Hysteroscopy and endometrial curretage;  Surgeon: Boykin Nearing, MD;  Location: [facility];  Service: Gynecology;  Laterality: N/A;  . DILATION AND CURETTAGE OF UTERUS    . EXCISION MASS FROM NECK  Right    Dr. Tami Ribas  . EYE SURGERY  05/29/2020   bil cataract 9/08  . HAMMER TOE SURGERY Right   . IR BONE MARROW BIOPSY & ASPIRATION  09/09/2021  . IR IMAGING GUIDED PORT INSERTION  09/09/2021  . JOINT REPLACEMENT Bilateral 2008, 11/26/ 2012   Partial Knee Replacements  . LEFT HEART CATH AND CORONARY ANGIOGRAPHY N/A 10/25/2017   Procedure: LEFT HEART CATH AND CORONARY ANGIOGRAPHY;  Surgeon: Teodoro Spray, MD;  Location: [facility];  Service: Cardiovascular;  Laterality: N/A;  . LEFT HEART CATH AND CORONARY ANGIOGRAPHY N/A 10/26/2017   Procedure: LEFT HEART CATH AND CORONARY ANGIOGRAPHY;  Surgeon: Isaias Cowman, MD;  Location: [facility];  Service: Cardiovascular;  Laterality: N/A;  . PARTIAL MASTECTOMY WITH NEEDLE LOCALIZATION Left 06/04/2016   Procedure: PARTIAL MASTECTOMY WITH NEEDLE LOCALIZATION;  Surgeon: Leonie Green, MD;  Location: [facility];  Service: General;  Laterality: Left;  . SCLERAL BUCKLE  02/16/2011   Procedure: SCLERAL BUCKLE;  Surgeon: Hayden Pedro, MD;  Location: [facility];  Service: Ophthalmology;  Laterality: Left;  Scleral Buckle Left Eye with Headscope Laser  . SENTINEL NODE BIOPSY Left 06/04/2016   Procedure: SENTINEL NODE BIOPSY;  Surgeon: Leonie Green, MD;  Location: [facility];  Service: General;  Laterality: Left;    SOCIAL HISTORY:   Social History   Tobacco Use  . Smoking status: Never  . Smokeless tobacco: Never  Substance Use Topics  . Alcohol use: No    FAMILY HISTORY:   Family History  Problem Relation Age of Onset  .  Breast cancer Mother 57  . Heart disease Father   . Breast cancer Paternal Aunt 73  . Breast cancer Cousin   . Anesthesia problems Neg Hx   . Hypotension Neg Hx   . Malignant hyperthermia Neg Hx   . Pseudochol deficiency Neg Hx     DRUG ALLERGIES:   Allergies  Allergen Reactions  . Codeine Other (See Comments)    Stroke like symptoms  . Tramadol Itching    REVIEW OF SYSTEMS:   ROS As per history of present illness. All pertinent systems were reviewed above. Constitutional, HEENT, cardiovascular, respiratory, GI, GU, musculoskeletal, neuro, psychiatric, endocrine, integumentary and hematologic systems were reviewed and are otherwise negative/unremarkable except for positive findings mentioned above in the HPI.   MEDICATIONS AT HOME:   Prior to Admission medications   Medication Sig Start Date End Date Taking? Authorizing Provider  acetaminophen (TYLENOL) 500 MG tablet Take 500 mg by mouth every 4 (four) hours as needed.    [provider]  amLODipine (NORVASC) 10 MG tablet Take  1 tablet (10 mg total) by mouth daily. 09/28/21   Emeterio Reeve, DO  apixaban (ELIQUIS) 5 MG TABS tablet Take 1 tablet (5 mg total) by mouth 2 (two) times daily. START taking it form 11/18/2021 11/18/21   Fritzi Mandes, MD  atorvastatin (LIPITOR) 40 MG tablet Take 1 tablet (40 mg total) by mouth at bedtime. 05/29/19   Enzo Bi, MD  famotidine (PEPCID) 40 MG tablet Take 40 mg by mouth at bedtime.    [provider]  Fluticasone-Salmeterol (ADVAIR) 100-50 MCG/DOSE AEPB Inhale 1 puff into the lungs 2 (two) times daily as needed (for asthma.).    [provider]  Ipratropium-Albuterol (COMBIVENT) 20-100 MCG/ACT AERS respimat Inhale 1 puff into the lungs 4 (four) times daily as needed for wheezing. 11/10/21   Fritzi Mandes, MD  isosorbide mononitrate (IMDUR) 30 MG 24 hr tablet Take 30 mg by mouth daily.    [provider]  letrozole (FEMARA) 2.5 MG tablet TAKE 1 TABLET BY MOUTH  ONCE DAILY 08/11/20   Lloyd Huger, MD  levofloxacin (LEVAQUIN) 500 MG tablet Take 1 tablet (500 mg total) by mouth daily for 14 days. 11/19/21 12/03/21  Lloyd Huger, MD  levothyroxine (SYNTHROID) 125 MCG tablet Take 125 mcg by mouth daily.    [provider]  magic mouthwash (nystatin, hydrocortisone, diphenhydrAMINE) suspension Swish and spit 5 mLs 3 (three) times daily as needed for mouth pain. 09/23/21   Hughie Closs, PA-C  nitroGLYCERIN (NITROSTAT) 0.4 MG SL tablet Place 0.4 mg under the tongue every 5 (five) minutes as needed for chest pain. Maximum 3 doses, If no relief call md or 911.    [provider]  potassium chloride (KLOR-CON) 10 MEQ tablet Take 1 tablet (10 mEq total) by mouth 2 (two) times daily. 05/29/19   Enzo Bi, MD  predniSONE (DELTASONE) 20 MG tablet Take 100 mg by mouth daily. Take with food on days 1-5 of chemotherapy.    Lloyd Huger, MD  RABEprazole (ACIPHEX) 20 MG tablet Take 20 mg by mouth daily.    [provider]  sertraline (ZOLOFT) 50 MG tablet Take 50 mg by mouth at bedtime.    [provider]  sotalol (BETAPACE) 80 MG tablet Take 80 mg by mouth 2 (two) times daily. 09/12/19   [provider]  torsemide (DEMADEX) 20 MG tablet Take 10 mg by mouth as directed. Take on Tuesday and Friday Patient not taking: Reported on 09/23/2021    [provider]  allopurinol (ZYLOPRIM) 300 MG tablet Take 1 tablet (300 mg total) by mouth daily. 09/11/21 11/06/21  Lloyd Huger, MD  prochlorperazine (COMPAZINE) 10 MG tablet Take 1 tablet (10 mg total) by mouth every 6 (six) hours as needed (Nausea or vomiting). 09/11/21 11/06/21  Lloyd Huger, MD      VITAL SIGNS:  Blood pressure 135/85, pulse 83, temperature 98.8 F (37.1 C), temperature source Oral, resp. rate 16, height $RemoveBe'5\' 2"'BGJsdLBFV$  (1.575 m), weight 73.9 kg, SpO2 99 %.  PHYSICAL EXAMINATION:  Physical Exam  GENERAL:  76 y.o.-year-old Caucasian female  patient lying in the bed with no acute distress.  EYES: Pupils equal, round, reactive to light and accommodation. No scleral icterus. Extraocular muscles intact.  HEENT: Head atraumatic, normocephalic. Oropharynx and nasopharynx clear.  NECK:  Supple, no jugular venous distention. No thyroid enlargement, no tenderness.  LUNGS: Normal breath sounds bilaterally, no wheezing, rales,rhonchi or crepitation. No use of accessory muscles of respiration.  CARDIOVASCULAR: Regular rate and  rhythm, S1, S2 normal. No murmurs, rubs, or gallops.  ABDOMEN: Soft, nondistended, nontender. Bowel sounds present. No organomegaly or mass.  EXTREMITIES: No pedal edema, cyanosis, or clubbing.  NEUROLOGIC: Cranial nerves II through XII are intact. Muscle strength 5/5 in all extremities. Sensation intact. Gait not checked.  PSYCHIATRIC: The patient is alert and oriented x 3.  Normal affect and good eye contact. SKIN: No obvious rash, lesion, or ulcer.   LABORATORY PANEL:   CBC Recent Labs  Lab 11/25/21 2320  WBC 0.2*  HGB 9.2*  HCT 28.5*  PLT 24*   ------------------------------------------------------------------------------------------------------------------  Chemistries  Recent Labs  Lab 11/25/21 2320  NA 138  K 3.4*  CL 101  CO2 27  GLUCOSE 121*  BUN 15  CREATININE 0.46  CALCIUM 9.1  AST 31  ALT 40  ALKPHOS 94  BILITOT 1.2   ------------------------------------------------------------------------------------------------------------------  Cardiac Enzymes No results for input(s): "TROPONINI" in the last 168 hours. ------------------------------------------------------------------------------------------------------------------  RADIOLOGY:  DG Chest 1 View  Result Date: 11/25/2021 CLINICAL DATA:  Weakness, fatigue EXAM: CHEST  1 VIEW COMPARISON:  None Available. FINDINGS: Lungs are clear. No pneumothorax or pleural effusion. Cardiac size within normal limits. Right internal jugular chest  port tip seen within the superior cavoatrial junction. Pulmonary vascularity is normal. No acute bone abnormality. IMPRESSION: No radiographic evidence of acute cardiopulmonary disease. Electronically Signed   By: Fidela Salisbury M.D.   On: 11/25/2021 23:03      IMPRESSION AND PLAN:  Assessment and Plan: * Weakness - The patient will be admitted to a medical telemetry bed. - This is likely secondary to her pancytopenia due to recent chemotherapy. - We will obtain an oncology consult. - I notified Dr. Janese Banks about the patient.   DLBCL (diffuse large B cell lymphoma) ([place]) - The patient has been getting R-CHOP regimen and her last session was 6 days ago.  Pancytopenia, chemotherapy induced([facility]) - We will continue prophylactic Levaquin for now. -Oncology consult will be obtained as mentioned above.  Essential hypertension - We will continue antihypertensives.  COVID-19 virus RNA test result positive at limit of detection - This has been positive for a while, including 9/6, 8/18 and 7/5, likely secondary to her severe neutropenia. - We will defer any management to the oncologist's discretion at this time.  Hypothyroidism - We will continue Synthroid.   DVT prophylaxis: SCDs.  Medical prophylaxis currently contraindicated due to thrombocytopenia. Advanced Care Planning:  Code Status: full code. Family Communication:  The plan of care was discussed in details with the patient (and family). I answered all questions. The patient agreed to proceed with the above mentioned plan. Further management will depend upon hospital course. Disposition Plan: Back to previous home environment Consults called: none.  All the records are reviewed and case discussed with ED provider.  Status is: Inpatient   At the time of the admission, it appears that the appropriate admission status for this patient is inpatient.  This is judged to be reasonable and necessary in order to provide the required  intensity of service to ensure the patient's safety given the presenting symptoms, physical exam findings and initial radiographic and laboratory data in the context of comorbid conditions.  The patient requires inpatient status due to high intensity of service, high risk of further deterioration and high frequency of surveillance required.  I certify that at the time of admission, it is my clinical judgment that the patient will require inpatient hospital care extending more than 2 midnights.  Dispo: The patient is from: Home              Anticipated d/c is to: Home              Patient currently is not medically stable to d/c.              Difficult to place patient: No  Christel Mormon M.D on 11/26/2021 at 4:53 AM  [facility]   From 7 PM-7 AM, contact night-coverage [email or web address]  CC: Primary care physician; Rusty Aus, MD

## 2021-11-27 ENCOUNTER — Other Ambulatory Visit: Payer: Self-pay | Admitting: *Deleted

## 2021-11-27 DIAGNOSIS — R531 Weakness: Secondary | ICD-10-CM

## 2021-11-27 DIAGNOSIS — R0602 Shortness of breath: Secondary | ICD-10-CM

## 2021-11-27 DIAGNOSIS — C8338 Diffuse large B-cell lymphoma, lymph nodes of multiple sites: Secondary | ICD-10-CM

## 2021-11-27 DIAGNOSIS — Z09 Encounter for follow-up examination after completed treatment for conditions other than malignant neoplasm: Secondary | ICD-10-CM

## 2021-11-27 LAB — COMPREHENSIVE METABOLIC PANEL
ALT: 36 U/L (ref 0–44)
AST: 25 U/L (ref 15–41)
Albumin: 2.6 g/dL — ABNORMAL LOW (ref 3.5–5.0)
Alkaline Phosphatase: 90 U/L (ref 38–126)
Anion gap: 4 — ABNORMAL LOW (ref 5–15)
BUN: 10 mg/dL (ref 8–23)
CO2: 24 mmol/L (ref 22–32)
Calcium: 8.3 mg/dL — ABNORMAL LOW (ref 8.9–10.3)
Chloride: 109 mmol/L (ref 98–111)
Creatinine, Ser: 0.39 mg/dL — ABNORMAL LOW (ref 0.44–1.00)
GFR, Estimated: >60 mL/min (ref >60)
Glucose, Bld: 97 mg/dL (ref 70–99)
Potassium: 3.7 mmol/L (ref 3.5–5.1)
Sodium: 137 mmol/L (ref 135–145)
Total Bilirubin: 1 mg/dL (ref 0.3–1.2)
Total Protein: 5.1 g/dL — ABNORMAL LOW (ref 6.5–8.1)

## 2021-11-27 LAB — CBC
HCT: 26.4 % — ABNORMAL LOW (ref 36.0–46.0)
Hemoglobin: 9.1 g/dL — ABNORMAL LOW (ref 12.0–15.0)
MCH: 29.1 pg (ref 26.0–34.0)
MCHC: 34.5 g/dL (ref 30.0–36.0)
MCV: 84.3 fL (ref 80.0–100.0)
Platelets: 15 10*3/uL — CL (ref 150–400)
RBC: 3.13 MIL/uL — ABNORMAL LOW (ref 3.87–5.11)
RDW: 19.9 % — ABNORMAL HIGH (ref 11.5–15.5)
WBC: 0.2 10*3/uL — CL (ref 4.0–10.5)
nRBC: 0 % (ref 0.0–0.2)

## 2021-11-27 LAB — C-REACTIVE PROTEIN: CRP: 9.9 mg/dL — ABNORMAL HIGH (ref <1.0)

## 2021-11-27 LAB — PREPARE RBC (CROSSMATCH)

## 2021-11-27 LAB — PROCALCITONIN: Procalcitonin: <0.1 ng/mL

## 2021-11-27 MED ORDER — CHLORHEXIDINE GLUCONATE CLOTH 2 % EX PADS
6.0000 | MEDICATED_PAD | Freq: Every day | CUTANEOUS | Status: DC
  Administered 2021-11-28 – 2021-11-29 (×2): 6 via TOPICAL

## 2021-11-27 MED ORDER — SODIUM CHLORIDE 0.9% IV SOLUTION: Freq: Once | INTRAVENOUS | Status: AC

## 2021-11-27 MED ORDER — CHLORHEXIDINE GLUCONATE CLOTH 2 % EX PADS: 6.0000 | MEDICATED_PAD | Freq: Every day | CUTANEOUS | Status: DC

## 2021-11-27 MED ORDER — CHLORHEXIDINE GLUCONATE CLOTH 2 % EX PADS
6.0000 | MEDICATED_PAD | Freq: Every day | CUTANEOUS | Status: DC
Start: 2021-11-27 — End: 2021-11-27

## 2021-11-27 NOTE — Progress Notes (Signed)
PROGRESS NOTE    Stacey Stone  ZOX:[id number] DOB: 1945/09/08 DOA: 11/25/2021 PCP: Rusty Aus, MD   Brief Narrative:  76 year old female with history of diffuse large B-cell lymphoma undergoing chemotherapy admitted with pancytopenia and generalized weakness.  This was her fourth chemo treatment. She reports she has 2 more chemotherapy sessions coming up and she wants to finish all the treatments as much as possible.  She was admitted last month with febrile neutropenia and pancytopenia after chemotherapy.  She has history of prolonged COVID.  Per infectious disease plan was to continue isolation till 11/27/2021.  She is COVID-positive this admission.  She has no respiratory symptoms.  She has a history significant for CAD, depression, chronic atrial fibrillation, type 2 diabetes, hypertension, history of breast cancer, hypothyroidism and obstructive sleep apnea.  Assessment & Plan:   Principal Problem:   Weakness Active Problems:   DLBCL (diffuse large B cell lymphoma) (HCC)   Pancytopenia, chemotherapy induced(HCC)   Essential hypertension   Hypothyroidism   COVID-19 virus RNA test result positive at limit of detection     Pressure Injury 11/08/21 Ischial tuberosity Left Stage 2 -  Partial thickness loss of dermis presenting as a shallow open injury with a red, pink wound bed without slough. s2 right thigh (Active)  11/08/21 1115  Location: Ischial tuberosity  Location Orientation: Left  Staging: Stage 2 -  Partial thickness loss of dermis presenting as a shallow open injury with a red, pink wound bed without slough.  Wound Description (Comments): s2 right thigh  Present on Admission: No    #1 pancytopenia with severe thrombocytopenia and neutropenia with ANC of 0.  This is related to recent chemotherapy if she received a week ago for large B-cell lymphoma.  She was admitted to the hospital last month after chemotherapy with neutropenic fever and was treated with  broad-spectrum antibiotics no source of infection was found. We will transfuse 1 unit of packed RBC and platelets today. She received udenyca with last treatment.  Follow-up labs later today and in AM. PT eval pending. On empiric Levaquin  #2 prolonged long-haul COVID with no symptoms patient to be on isolation till 11/27/2021.  #3 hypothyroidism on Synthroid  #4 history of essential hypertension blood pressure 118/66 Holding Demadex continue sotalol  #5 chronic A-fib was on Eliquis at home which is on hold due to anemia and thrombocytopenia.    Estimated body mass index is 29.52 kg/m as calculated from the following:   Height as of this encounter: '5\' 2"'$  (1.575 m).   Weight as of this encounter: 73.2 kg.  DVT prophylaxis: None due to severe thrombocytopenia anemia blood transfusion  code Status: Full Family Communication: None at bedside  disposition Plan:  Status is: Inpatient Remains inpatient appropriate because: Pancytopenia requiring blood transfusion   Consultants:  Oncology  Procedures: None Antimicrobials: Levaquin  Subjective: She feels a little better than yesterday however still weak too weak to walk with therapy no cough diarrhea nausea vomiting  Objective: Vitals:   11/27/21 1059 11/27/21 1325 11/27/21 1349 11/27/21 1404  BP: 116/68 118/67 118/67 118/66  Pulse: 83 78 78 76  Resp: 19 (!) 21  (!) 22  Temp: 99.1 F (37.3 C) 98.2 F (36.8 C) 98.2 F (36.8 C) 98.7 F (37.1 C)  TempSrc: Oral Oral Oral Oral  SpO2: 99% 100% 100% 98%  Weight:      Height:        Intake/Output Summary (Last 24 hours) at 11/27/2021 1407 Last data  filed at 11/27/2021 1349 Gross per 24 hour  Intake 3333.78 ml  Output 1400 ml  Net 1933.78 ml   Filed Weights   11/25/21 2244 11/26/21 1628  Weight: 73.9 kg 73.2 kg    Examination:  General exam: Appears weak respiratory system: Clear to auscultation. Respiratory effort normal. Cardiovascular system: S1 & S2 heard, RRR. No  JVD, murmurs, rubs, gallops or clicks. No pedal edema. Gastrointestinal system: Abdomen is nondistended, soft and nontender. No organomegaly or masses felt. Normal bowel sounds heard. Central nervous system: Alert and oriented. No focal neurological deficits. Extremities: Symmetric 5 x 5 power. Skin: No rashes, lesions or ulcers Psychiatry: Judgement and insight appear normal. Mood & affect appropriate.     Data Reviewed: I have personally reviewed following labs and imaging studies  CBC: Recent Labs  Lab 11/20/21 1647 11/25/21 2320 11/26/21 0446 11/27/21 0450  WBC 7.6 0.2* 0.2* 0.1*  NEUTROABS 6.9 0.0*  --  0.0*  HGB 10.0* 9.2* 8.9* 7.7*  HCT 30.7* 28.5* 27.5* 23.0*  MCV 88.2 88.2 88.4 87.1  PLT 181 24* 21* 14*   Basic Metabolic Panel: Recent Labs  Lab 11/20/21 1647 11/25/21 2320 11/27/21 0450  NA 140 138 137  K 4.4 3.4* 3.7  CL 107 101 109  CO2 '23 27 24  '$ GLUCOSE 178* 121* 97  BUN '15 15 10  '$ CREATININE 0.75 0.46 0.39*  CALCIUM 9.5 9.1 8.3*   GFR: Estimated Creatinine Clearance: 55.1 mL/min (A) (by C-G formula based on SCr of 0.39 mg/dL (L)). Liver Function Tests: Recent Labs  Lab 11/20/21 1647 11/25/21 2320 11/27/21 0450  AST 64* 31 25  ALT 35 40 36  ALKPHOS 120 94 90  BILITOT 0.8 1.2 1.0  PROT 6.6 5.7* 5.1*  ALBUMIN 3.6 3.1* 2.6*   Recent Labs  Lab 11/25/21 2320  LIPASE 19   No results for input(s): "AMMONIA" in the last 168 hours. Coagulation Profile: No results for input(s): "INR", "PROTIME" in the last 168 hours. Cardiac Enzymes: No results for input(s): "CKTOTAL", "CKMB", "CKMBINDEX", "TROPONINI" in the last 168 hours. BNP (last 3 results) No results for input(s): "PROBNP" in the last 8760 hours. HbA1C: No results for input(s): "HGBA1C" in the last 72 hours. CBG: No results for input(s): "GLUCAP" in the last 168 hours. Lipid Profile: No results for input(s): "CHOL", "HDL", "LDLCALC", "TRIG", "CHOLHDL", "LDLDIRECT" in the last 72  hours. Thyroid Function Tests: No results for input(s): "TSH", "T4TOTAL", "FREET4", "T3FREE", "THYROIDAB" in the last 72 hours. Anemia Panel: No results for input(s): "VITAMINB12", "FOLATE", "FERRITIN", "TIBC", "IRON", "RETICCTPCT" in the last 72 hours. Sepsis Labs: Recent Labs  Lab 11/25/21 2320 11/26/21 1629 11/27/21 0450  PROCALCITON <0.10  --  <0.10  LATICACIDVEN 1.6 1.0  --     Recent Results (from the past 240 hour(s))  Urine Culture     Status: Abnormal   Collection Time: 11/20/21  9:59 PM   Specimen: Urine, Random  Result Value Ref Range Status   Specimen Description   Final    URINE, RANDOM Performed at [facility], [address], [place], [place] [address]    Special Requests   Final    NONE Performed at [facility], [address] [place], [place] [address]    Culture (A)  Final    <10,000 COLONIES/mL INSIGNIFICANT GROWTH Performed at [facility], [place] [address] [place], [place] [address]    Report Status 11/22/2021 FINAL  Final  Resp Panel by RT-PCR (Flu A&B, Covid) Anterior  Nasal Swab     Status: Abnormal   Collection Time: 11/25/21 11:20 PM   Specimen: Anterior Nasal Swab  Result Value Ref Range Status   SARS Coronavirus 2 by RT PCR POSITIVE (A) NEGATIVE Final    Comment: (NOTE) SARS-CoV-2 target nucleic acids are DETECTED.  The SARS-CoV-2 RNA is generally detectable in upper respiratory specimens during the acute phase of infection. Positive results are indicative of the presence of the identified virus, but do not rule out bacterial infection or co-infection with other pathogens not detected by the test. Clinical correlation with patient history and other diagnostic information is necessary to determine patient infection status. The expected result is Negative.  Fact Sheet for Patients: EntrepreneurPulse.com.au  Fact Sheet for Healthcare Providers: IncredibleEmployment.be  This  test is not yet approved or cleared by the [place] FDA and  has been authorized for detection and/or diagnosis of SARS-CoV-2 by FDA under an Emergency Use Authorization (EUA).  This EUA will remain in effect (meaning this test can be used) for the duration of  the COVID-19 declaration under Section 564(b)(1) of the A ct, 21 U.S.C. section 360bbb-3(b)(1), unless the authorization is terminated or revoked sooner.     Influenza A by PCR NEGATIVE NEGATIVE Final   Influenza B by PCR NEGATIVE NEGATIVE Final    Comment: (NOTE) The Xpert Xpress SARS-CoV-2/FLU/RSV plus assay is intended as an aid in the diagnosis of influenza from Nasopharyngeal swab specimens and should not be used as a sole basis for treatment. Nasal washings and aspirates are unacceptable for Xpert Xpress SARS-CoV-2/FLU/RSV testing.  Fact Sheet for Patients: EntrepreneurPulse.com.au  Fact Sheet for Healthcare Providers: IncredibleEmployment.be  This test is not yet approved or cleared by the [place] FDA and has been authorized for detection and/or diagnosis of SARS-CoV-2 by FDA under an Emergency Use Authorization (EUA). This EUA will remain in effect (meaning this test can be used) for the duration of the COVID-19 declaration under Section 564(b)(1) of the Act, 21 U.S.C. section 360bbb-3(b)(1), unless the authorization is terminated or revoked.  Performed at [facility], [address] [place], [place] [address]   Culture, blood (routine x 2)     Status: None (Preliminary result)   Collection Time: 11/25/21 11:20 PM   Specimen: BLOOD  Result Value Ref Range Status   Specimen Description BLOOD PORTA CATH  Final   Special Requests   Final    BOTTLES DRAWN AEROBIC AND ANAEROBIC Blood Culture adequate volume   Culture   Final    NO GROWTH 2 DAYS Performed at [facility], [address], [place], [place] [address]    Report Status PENDING   Incomplete  Culture, blood (routine x 2)     Status: None (Preliminary result)   Collection Time: 11/25/21 11:20 PM   Specimen: BLOOD  Result Value Ref Range Status   Specimen Description BLOOD RIGHT WRIST  Final   Special Requests   Final    BOTTLES DRAWN AEROBIC AND ANAEROBIC Blood Culture adequate volume   Culture   Final    NO GROWTH 1 DAY Performed at [facility], [address] [place], [place] [address]    Report Status PENDING  Incomplete         Radiology Studies: DG Abd 1 View  Result Date: 11/26/2021 CLINICAL DATA:  Nausea and vomiting today. EXAM: ABDOMEN - 1 VIEW COMPARISON:  None Available. FINDINGS: Bowel gas pattern is nonobstructive. Average stool burden. No organomegaly or abnormal calcifications. IMPRESSION: No  evidence for acute  abnormality. Electronically Signed   By: Nolon Nations M.D.   On: 11/26/2021 13:30   DG Chest 1 View  Result Date: 11/25/2021 CLINICAL DATA:  Weakness, fatigue EXAM: CHEST  1 VIEW COMPARISON:  None Available. FINDINGS: Lungs are clear. No pneumothorax or pleural effusion. Cardiac size within normal limits. Right internal jugular chest port tip seen within the superior cavoatrial junction. Pulmonary vascularity is normal. No acute bone abnormality. IMPRESSION: No radiographic evidence of acute cardiopulmonary disease. Electronically Signed   By: Fidela Salisbury M.D.   On: 11/25/2021 23:03        Scheduled Meds:  vitamin C  500 mg Oral Daily   atorvastatin  40 mg Oral QHS   cholecalciferol  1,000 Units Oral Daily   famotidine  40 mg Oral QHS   letrozole  2.5 mg Oral Daily   levofloxacin  500 mg Oral Q breakfast   levothyroxine  125 mcg Oral Q0600   pantoprazole  40 mg Oral Daily   potassium chloride  10 mEq Oral BID   sertraline  50 mg Oral QHS   sotalol  80 mg Oral BID   zinc sulfate  220 mg Oral Daily   Continuous Infusions:  0.9 % NaCl with KCl 20 mEq / L 75 mL/hr at 11/27/21 0729     LOS: 1 day    Time  spent: 32 min  Georgette Shell, MD  11/27/2021, 2:07 PM

## 2021-11-27 NOTE — Plan of Care (Signed)
  Problem: Clinical Measurements: Goal: Respiratory complications will improve Outcome: Progressing   Problem: Elimination: Goal: Will not experience complications related to urinary retention Outcome: Progressing   Problem: Pain Managment: Goal: General experience of comfort will improve Outcome: Progressing   Problem: Safety: Goal: Ability to remain free from injury will improve Outcome: Progressing   Problem: Skin Integrity: Goal: Risk for impaired skin integrity will decrease Outcome: Progressing

## 2021-11-27 NOTE — Progress Notes (Signed)
PT Cancellation Note  Patient Details Name: CHARMA MOCARSKI MRN: [id number] DOB: 04-Sep-1945   Cancelled Treatment:    Reason Eval/Treat Not Completed: Other (comment). Orders received and chart reviewed. Per RN at bedside pt receiving platelets and blood transfusion this a.m. asking PT to return after treatment. PT to follow up as able and medically appropriate.    Salem Caster. Fairly IV, PT, DPT Physical Therapist- [facility]  11/27/2021, 11:26 AM

## 2021-11-27 NOTE — TOC Initial Note (Signed)
Transition of Care [facility]) - Initial/Assessment Note    Patient Details  Name: Stacey Stone MRN: [id number] Date of Birth: 1946-03-12  Transition of Care [facility]) CM/SW Contact:    Beverly Sessions, RN Phone Number: 11/27/2021, 2:30 PM  Clinical Narrative:                  Patient with high risk for readmission score.  Patient was assessed by TOC previous admission 8/21.  See note from that admission below "Readmission prevention screen complete. CSW called patient in the room, introduced role, and explained that discharge planning would be discussed. PCP is Emily Filbert, MD. Patient was previously driving herself to appointments but since starting chemo, her daughter or brother transports her. Pharmacy is [facility]. No issues obtaining medications. No home health therapy prior to admission. She does have personal care services through [facility]. On chemo weeks, they come 3 days a week, 5 hours a day. On non-chemo weeks they come 2 days a week, 2 hours a day. Daughter is working on increasing her hours. Patient has three walkers and two canes at home. Her shower has a built-in seat. No further concerns. CSW encouraged patient to contact CSW as needed. CSW will continue to follow patient for support and facilitate return home once stable. Daughter or brother will transport her home at discharge depending on the day.     "  PT eval pending.  Patient has [facility] home health in the past        Patient Goals and CMS Choice        Expected Discharge Plan and Services                                                Prior Living Arrangements/Services                       Activities of Daily Living Home Assistive Devices/Equipment: None ADL Screening (condition at time of admission) Patient's cognitive ability adequate to safely complete daily activities?: Yes Is the patient deaf or have difficulty hearing?: No Does the patient have difficulty seeing,  even when wearing glasses/contacts?: No Does the patient have difficulty concentrating, remembering, or making decisions?: No Patient able to express need for assistance with ADLs?: Yes Does the patient have difficulty dressing or bathing?: No Independently performs ADLs?: No Communication: Independent Dressing (OT): Independent Grooming: Independent Feeding: Independent Bathing: Independent Toileting: Needs assistance Is this a change from baseline?: Change from baseline, expected to last <3 days In/Out Bed: Needs assistance Is this a change from baseline?: Change from baseline, expected to last <3 days Walks in Home: Needs assistance Is this a change from baseline?: Change from baseline, expected to last <3 days Does the patient have difficulty walking or climbing stairs?: Yes Weakness of Legs: Both Weakness of Arms/Hands: None  Permission Sought/Granted                  Emotional Assessment              Admission diagnosis:  Weakness [R53.1] Chemotherapy-induced neutropenia ([place]) [D70.1, T45.1X5A] Other fatigue [R53.83] Patient Active Problem List   Diagnosis Date Noted   Weakness 11/26/2021   COVID-19 virus RNA test result positive at limit of detection 11/26/2021   Elevated d-dimer 11/08/2021   Ulcer of RIGHT buttock (  [place]) 11/08/2021   Thrombocytopenia due to drugs 11/07/2021   Pancytopenia due to chemotherapy ([place]) 11/07/2021   Sepsis due to COVID-19 [facility]) 11/06/2021   Hypokalemia 11/06/2021   Bacteremia due to ESBL-producing Escherichia coli source UTI 09/26/2021   Finger nail contusion, initial encounter 09/25/2021   Bacteremia 09/24/2021   Dehydration 09/23/2021   Febrile neutropenia ([place]) 09/23/2021   Diabetes mellitus without complication ([place])    Pancytopenia, chemotherapy induced(HCC)    Urinary tract infection    COVID-19 virus infection    DLBCL (diffuse large B cell lymphoma) ([place]) 09/10/2021   Peripheral edema 07/13/2019   Sepsis ([place])  05/24/2019   Cellulitis of right lower extremity 05/24/2019   Bilateral edema of lower extremity 05/24/2019   Abnormal LFTs 06/06/2018   History of breast cancer 06/06/2018   Status post ablation of atrial fibrillation 02/08/2018   PMB (postmenopausal bleeding) 06/17/2016   Preoperative cardiovascular examination 05/13/2016   Primary cancer of upper outer quadrant of left female breast ([place]) 05/09/2016   Unstable angina (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/2011   Coronary artery disease 01/08/2006   PCP:  Rusty Aus, MD Pharmacy:   [facility], [place] [address] Phone: [phone] Fax: [phone]     Social Determinants of Health (SDOH) Interventions    Readmission Risk Interventions    11/27/2021    2:30 PM 11/09/2021    1:17 PM  Readmission Risk Prevention Plan  Transportation Screening Complete Complete  PCP or Specialist Appt within 3-5 Days  Complete  HRI or Home Care Consult  Complete  Social Work Consult for [place] Planning/Counseling  Complete  Palliative Care Screening Not Applicable Not Applicable  Medication Review [employer] photographer) Complete Complete

## 2021-11-28 DIAGNOSIS — R531 Weakness: Secondary | ICD-10-CM | POA: Diagnosis not present

## 2021-11-28 LAB — PREPARE PLATELET PHERESIS: Unit division: 0

## 2021-11-28 LAB — CBC WITH DIFFERENTIAL/PLATELET
Abs Immature Granulocytes: 0 10*3/uL (ref 0.00–0.07)
Basophils Absolute: 0 10*3/uL (ref 0.0–0.1)
Basophils Relative: 5 %
Eosinophils Absolute: 0 10*3/uL (ref 0.0–0.5)
Eosinophils Relative: 6 %
HCT: 25.3 % — ABNORMAL LOW (ref 36.0–46.0)
Hemoglobin: 8.5 g/dL — ABNORMAL LOW (ref 12.0–15.0)
Immature Granulocytes: 0 %
Lymphocytes Relative: 78 %
Lymphs Abs: 0.1 10*3/uL — ABNORMAL LOW (ref 0.7–4.0)
MCH: 28.3 pg (ref 26.0–34.0)
MCHC: 33.6 g/dL (ref 30.0–36.0)
MCV: 84.3 fL (ref 80.0–100.0)
Monocytes Absolute: 0 10*3/uL — ABNORMAL LOW (ref 0.1–1.0)
Monocytes Relative: 6 %
Neutro Abs: 0 10*3/uL — CL (ref 1.7–7.7)
Neutrophils Relative %: 5 %
Platelets: 12 10*3/uL — CL (ref 150–400)
RBC: 3 MIL/uL — ABNORMAL LOW (ref 3.87–5.11)
RDW: 20.3 % — ABNORMAL HIGH (ref 11.5–15.5)
Smear Review: DECREASED
WBC: 0.2 10*3/uL — CL (ref 4.0–10.5)
nRBC: 0 % (ref 0.0–0.2)

## 2021-11-28 LAB — BPAM RBC
Blood Product Expiration Date: 202309152359
ISSUE DATE / TIME: 202309081337
Unit Type and Rh: 9500

## 2021-11-28 LAB — COMPREHENSIVE METABOLIC PANEL
ALT: 31 U/L (ref 0–44)
AST: 25 U/L (ref 15–41)
Albumin: 2.6 g/dL — ABNORMAL LOW (ref 3.5–5.0)
Alkaline Phosphatase: 99 U/L (ref 38–126)
Anion gap: 7 (ref 5–15)
BUN: 8 mg/dL (ref 8–23)
CO2: 23 mmol/L (ref 22–32)
Calcium: 8.4 mg/dL — ABNORMAL LOW (ref 8.9–10.3)
Chloride: 106 mmol/L (ref 98–111)
Creatinine, Ser: 0.4 mg/dL — ABNORMAL LOW (ref 0.44–1.00)
GFR, Estimated: >60 mL/min (ref >60)
Glucose, Bld: 99 mg/dL (ref 70–99)
Potassium: 3.6 mmol/L (ref 3.5–5.1)
Sodium: 136 mmol/L (ref 135–145)
Total Bilirubin: 1.1 mg/dL (ref 0.3–1.2)
Total Protein: 5.2 g/dL — ABNORMAL LOW (ref 6.5–8.1)

## 2021-11-28 LAB — BPAM PLATELET PHERESIS
Blood Product Expiration Date: 202309102359
ISSUE DATE / TIME: 202309081029
Unit Type and Rh: 7300

## 2021-11-28 LAB — TYPE AND SCREEN
ABO/RH(D): O NEG
Antibody Screen: NEGATIVE
Unit division: 0

## 2021-11-28 MED ORDER — SODIUM CHLORIDE 0.9% IV SOLUTION: Freq: Once | INTRAVENOUS | Status: AC

## 2021-11-28 NOTE — Progress Notes (Signed)
PROGRESS NOTE    Stacey Stone  RCV:[id number] DOB: 09/21/1945 DOA: 11/25/2021 PCP: Rusty Aus, MD   Brief Narrative:  76 year old female with history of diffuse large B-cell lymphoma undergoing chemotherapy admitted with pancytopenia and generalized weakness.  This was her fourth chemo treatment. She reports she has 2 more chemotherapy sessions coming up and she wants to finish all the treatments as much as possible.  She was admitted last month with febrile neutropenia and pancytopenia after chemotherapy.  She has history of prolonged COVID.  Per infectious disease plan was to continue isolation till 11/27/2021.  She is COVID-positive this admission.  She has no respiratory symptoms.  She has a history significant for CAD, depression, chronic atrial fibrillation, type 2 diabetes, hypertension, history of breast cancer, hypothyroidism and obstructive sleep apnea.  Assessment & Plan:   Principal Problem:   Weakness Active Problems:   DLBCL (diffuse large B cell lymphoma) (HCC)   Pancytopenia, chemotherapy induced(HCC)   Essential hypertension   Hypothyroidism   COVID-19 virus RNA test result positive at limit of detection     Pressure Injury 11/08/21 Ischial tuberosity Left Stage 2 -  Partial thickness loss of dermis presenting as a shallow open injury with a red, pink wound bed without slough. s2 right thigh (Active)  11/08/21 1115  Location: Ischial tuberosity  Location Orientation: Left  Staging: Stage 2 -  Partial thickness loss of dermis presenting as a shallow open injury with a red, pink wound bed without slough.  Wound Description (Comments): s2 right thigh  Present on Admission: No    #1 pancytopenia with severe thrombocytopenia and neutropenia with ANC of 0.  This is related to recent chemotherapy she received a week ago for large B-cell lymphoma.  She was admitted to the hospital last month after chemotherapy with neutropenic fever and was treated with  broad-spectrum antibiotics no source of infection was found. He was transfused 1 unit of packed RBC and 1 unit of platelets 190 2023.  Platelets still low at 14.  We will give her another unit of platelets today. Patient is feeling better and she is anxious to go home. She received udenyca with last treatment.  PT eval pending. On empiric Levaquin will DC since no evidence of infection found  #2 prolonged long-haul COVID with no symptoms patient to be on isolation till 11/27/2021.  #3 hypothyroidism on Synthroid  #4 history of essential hypertension blood pressure 118/66 Holding Demadex continue sotalol  #5 chronic A-fib was on Eliquis at home which is on hold due to anemia and thrombocytopenia.    Estimated body mass index is 29.52 kg/m as calculated from the following:   Height as of this encounter: '5\' 2"'$  (1.575 m).   Weight as of this encounter: 73.2 kg.  DVT prophylaxis: None due to severe thrombocytopenia anemia blood transfusion  code Status: Full Family Communication: None at bedside  disposition Plan:  Status is: Inpatient Remains inpatient appropriate because: Pancytopenia requiring blood transfusion   Consultants:  Oncology  Procedures: None Antimicrobials: Levaquin  Subjective:  Platelets and white count still low ANC 6 0 hemoglobin improved Patient is still weak but better than yesterday Anxious to walk with therapy Objective: Vitals:   11/27/21 1630 11/27/21 1929 11/28/21 0455 11/28/21 0754  BP: 135/77 132/78 123/71 133/84  Pulse: 76 87 74 74  Resp: (!) '23 16 16 18  '$ Temp: 98.4 F (36.9 C) 98.4 F (36.9 C) 98.6 F (37 C) 98.9 F (37.2 C)  TempSrc: Oral Oral  Oral Oral  SpO2: 99% 98% 98% 98%  Weight:      Height:        Intake/Output Summary (Last 24 hours) at 11/28/2021 1116 Last data filed at 11/28/2021 0800 Gross per 24 hour  Intake 1923.65 ml  Output 2200 ml  Net -276.35 ml    Filed Weights   11/25/21 2244 11/26/21 1628  Weight: 73.9 kg 73.2  kg    Examination:  General exam: Appears weak respiratory system: Clear to auscultation. Respiratory effort normal. Cardiovascular system: S1 & S2 heard, RRR. No JVD, murmurs, rubs, gallops or clicks. No pedal edema. Gastrointestinal system: Abdomen is nondistended, soft and nontender. No organomegaly or masses felt. Normal bowel sounds heard. Central nervous system: Alert and oriented. No focal neurological deficits. Extremities: Symmetric 5 x 5 power. Skin: No rashes, lesions or ulcers Psychiatry: Judgement and insight appear normal. Mood & affect appropriate.     Data Reviewed: I have personally reviewed following labs and imaging studies  CBC: Recent Labs  Lab 11/25/21 2320 11/26/21 0446 11/27/21 0450 11/27/21 1830 11/28/21 0512  WBC 0.2* 0.2* 0.1* 0.2* 0.2*  NEUTROABS 0.0*  --  0.0*  --  0.0*  HGB 9.2* 8.9* 7.7* 9.1* 8.5*  HCT 28.5* 27.5* 23.0* 26.4* 25.3*  MCV 88.2 88.4 87.1 84.3 84.3  PLT 24* 21* 14* 15* 12*    Basic Metabolic Panel: Recent Labs  Lab 11/25/21 2320 11/27/21 0450 11/28/21 0512  NA 138 137 136  K 3.4* 3.7 3.6  CL 101 109 106  CO2 '27 24 23  '$ GLUCOSE 121* 97 99  BUN '15 10 8  '$ CREATININE 0.46 0.39* 0.40*  CALCIUM 9.1 8.3* 8.4*    GFR: Estimated Creatinine Clearance: 55.1 mL/min (A) (by C-G formula based on SCr of 0.4 mg/dL (L)). Liver Function Tests: Recent Labs  Lab 11/25/21 2320 11/27/21 0450 11/28/21 0512  AST '31 25 25  '$ ALT 40 36 31  ALKPHOS 94 90 99  BILITOT 1.2 1.0 1.1  PROT 5.7* 5.1* 5.2*  ALBUMIN 3.1* 2.6* 2.6*    Recent Labs  Lab 11/25/21 2320  LIPASE 19    No results for input(s): "AMMONIA" in the last 168 hours. Coagulation Profile: No results for input(s): "INR", "PROTIME" in the last 168 hours. Cardiac Enzymes: No results for input(s): "CKTOTAL", "CKMB", "CKMBINDEX", "TROPONINI" in the last 168 hours. BNP (last 3 results) No results for input(s): "PROBNP" in the last 8760 hours. HbA1C: No results for  input(s): "HGBA1C" in the last 72 hours. CBG: No results for input(s): "GLUCAP" in the last 168 hours. Lipid Profile: No results for input(s): "CHOL", "HDL", "LDLCALC", "TRIG", "CHOLHDL", "LDLDIRECT" in the last 72 hours. Thyroid Function Tests: No results for input(s): "TSH", "T4TOTAL", "FREET4", "T3FREE", "THYROIDAB" in the last 72 hours. Anemia Panel: No results for input(s): "VITAMINB12", "FOLATE", "FERRITIN", "TIBC", "IRON", "RETICCTPCT" in the last 72 hours. Sepsis Labs: Recent Labs  Lab 11/25/21 2320 11/26/21 1629 11/27/21 0450  PROCALCITON <0.10  --  <0.10  LATICACIDVEN 1.6 1.0  --      Recent Results (from the past 240 hour(s))  Urine Culture     Status: Abnormal   Collection Time: 11/20/21  9:59 PM   Specimen: Urine, Random  Result Value Ref Range Status   Specimen Description   Final    URINE, RANDOM Performed at [facility], [address], [place], [place] [address]    Special Requests   Final    NONE Performed at [facility], [address] [place], [place] [address]    Culture (A)  Final    <10,000 COLONIES/mL INSIGNIFICANT GROWTH Performed at [facility] [address], [place], [place] [address]    Report Status 11/22/2021 FINAL  Final  Resp Panel by RT-PCR (Flu A&B, Covid) Anterior Nasal Swab     Status: Abnormal   Collection Time: 11/25/21 11:20 PM   Specimen: Anterior Nasal Swab  Result Value Ref Range Status   SARS Coronavirus 2 by RT PCR POSITIVE (A) NEGATIVE Final    Comment: (NOTE) SARS-CoV-2 target nucleic acids are DETECTED.  The SARS-CoV-2 RNA is generally detectable in upper respiratory specimens during the acute phase of infection. Positive results are indicative of the presence of the identified virus, but do not rule out bacterial infection or co-infection with other pathogens not detected by the test. Clinical correlation with patient history and other diagnostic information is necessary to determine  patient infection status. The expected result is Negative.  Fact Sheet for Patients: EntrepreneurPulse.com.au  Fact Sheet for Healthcare Providers: IncredibleEmployment.be  This test is not yet approved or cleared by the [place] FDA and  has been authorized for detection and/or diagnosis of SARS-CoV-2 by FDA under an Emergency Use Authorization (EUA).  This EUA will remain in effect (meaning this test can be used) for the duration of  the COVID-19 declaration under Section 564(b)(1) of the A ct, 21 U.S.C. section 360bbb-3(b)(1), unless the authorization is terminated or revoked sooner.     Influenza A by PCR NEGATIVE NEGATIVE Final   Influenza B by PCR NEGATIVE NEGATIVE Final    Comment: (NOTE) The Xpert Xpress SARS-CoV-2/FLU/RSV plus assay is intended as an aid in the diagnosis of influenza from Nasopharyngeal swab specimens and should not be used as a sole basis for treatment. Nasal washings and aspirates are unacceptable for Xpert Xpress SARS-CoV-2/FLU/RSV testing.  Fact Sheet for Patients: EntrepreneurPulse.com.au  Fact Sheet for Healthcare Providers: IncredibleEmployment.be  This test is not yet approved or cleared by the [place] FDA and has been authorized for detection and/or diagnosis of SARS-CoV-2 by FDA under an Emergency Use Authorization (EUA). This EUA will remain in effect (meaning this test can be used) for the duration of the COVID-19 declaration under Section 564(b)(1) of the Act, 21 U.S.C. section 360bbb-3(b)(1), unless the authorization is terminated or revoked.  Performed at [facility], [address] [place], [place] [address]   Culture, blood (routine x 2)     Status: None (Preliminary result)   Collection Time: 11/25/21 11:20 PM   Specimen: BLOOD  Result Value Ref Range Status   Specimen Description BLOOD PORTA CATH  Final   Special Requests   Final     BOTTLES DRAWN AEROBIC AND ANAEROBIC Blood Culture adequate volume   Culture   Final    NO GROWTH 3 DAYS Performed at [facility], [address], [place], [place] [address]    Report Status PENDING  Incomplete  Culture, blood (routine x 2)     Status: None (Preliminary result)   Collection Time: 11/25/21 11:20 PM   Specimen: BLOOD  Result Value Ref Range Status   Specimen Description BLOOD RIGHT WRIST  Final   Special Requests   Final    BOTTLES DRAWN AEROBIC AND ANAEROBIC Blood Culture adequate volume   Culture   Final    NO GROWTH 2 DAYS Performed at [facility], [address] [place] [address] [place], [place] [address]    Report Status PENDING  Incomplete  Radiology Studies: DG Abd 1 View  Result Date: 11/26/2021 CLINICAL DATA:  Nausea and vomiting today. EXAM: ABDOMEN - 1 VIEW COMPARISON:  None Available. FINDINGS: Bowel gas pattern is nonobstructive. Average stool burden. No organomegaly or abnormal calcifications. IMPRESSION: No evidence for acute  abnormality. Electronically Signed   By: Nolon Nations M.D.   On: 11/26/2021 13:30        Scheduled Meds:  sodium chloride   Intravenous Once   vitamin C  500 mg Oral Daily   atorvastatin  40 mg Oral QHS   Chlorhexidine Gluconate Cloth  6 each Topical Q0600   cholecalciferol  1,000 Units Oral Daily   famotidine  40 mg Oral QHS   letrozole  2.5 mg Oral Daily   levofloxacin  500 mg Oral Q breakfast   levothyroxine  125 mcg Oral Q0600   pantoprazole  40 mg Oral Daily   potassium chloride  10 mEq Oral BID   sertraline  50 mg Oral QHS   sotalol  80 mg Oral BID   zinc sulfate  220 mg Oral Daily   Continuous Infusions:  0.9 % NaCl with KCl 20 mEq / L 75 mL/hr at 11/28/21 0857     LOS: 2 days    Time spent: 38 min  Georgette Shell, MD  11/28/2021, 11:16 AM

## 2021-11-28 NOTE — Evaluation (Signed)
Physical Therapy Evaluation Patient Details Name: Stacey Stone MRN: [id number] DOB: 11-07-1945 Today's Date: 11/28/2021  History of Present Illness  Pt is a 76 y/o F admitted on 11/25/21 with pancytopenia & generalized weakness. pt with hx of of prolonged covid & tested positive this admission. PMH: diffuse large B-cell lymphoma undergoing chemo, CAD, depression, chronic a-fib, DM2, HTN, breast CA, hypothyroidism, OSA  Clinical Impression  MD cleared pt for participation in session in setting of low WBCs & platelets. Pt received in bed & agreeable to tx. Pt reports she lives in the same town home with her son & family assisting, as well as a home health aide 5 hours/day 4-5 days/week. Pt reports she's ambulatory with a SPC & only the 1 previous fall in the past 6 months. On this date, pt is able to complete bed mobility with mod I, STS with supervision & ambulates to door & back with IV pole & supervision. Pt demonstrates slow, guarded gait. Discussed using SPC vs RW & pt would benefit from trial of SPC during next session as IV pole does provide more balance than SPC would; but pt is agreeable to using a RW if necessary. Will continue to follow pt acutely to address balance, endurance, and gait with LRAD.      Recommendations for follow up therapy are one component of a multi-disciplinary discharge planning process, led by the attending physician.  Recommendations may be updated based on patient status, additional functional criteria and insurance authorization.  Follow Up Recommendations Home health PT      Assistance Recommended at Discharge Intermittent Supervision/Assistance  Patient can return home with the following  A little help with walking and/or transfers;A little help with bathing/dressing/bathroom;Assistance with cooking/housework;Assist for transportation;Help with stairs or ramp for entrance    Equipment Recommendations None recommended by PT  Recommendations for Other  Services       Functional Status Assessment Patient has had a recent decline in their functional status and demonstrates the ability to make significant improvements in function in a reasonable and predictable amount of time.     Precautions / Restrictions Precautions Precautions: Fall Restrictions Weight Bearing Restrictions: No      Mobility  Bed Mobility Overal bed mobility: Modified Independent Bed Mobility: Supine to Sit     Supine to sit: Modified independent (Device/Increase time), HOB elevated          Transfers Overall transfer level: Needs assistance Equipment used: None Transfers: Sit to/from Stand Sit to Stand: Supervision           General transfer comment: multiple STS from EOB    Ambulation/Gait Ambulation/Gait assistance: Supervision Gait Distance (Feet): 50 Feet (2 laps to door & back, pt preferred to stay in the room) Assistive device: IV Pole Gait Pattern/deviations: Decreased step length - right, Decreased step length - left, Decreased stride length Gait velocity: decreased     General Gait Details: cautious  Stairs            Wheelchair Mobility    Modified Rankin (Stroke Patients Only)       Balance Overall balance assessment: Needs assistance Sitting-balance support: Feet supported, Bilateral upper extremity supported Sitting balance-Leahy Scale: Good     Standing balance support: During functional activity, Single extremity supported Standing balance-Leahy Scale: Fair                               Pertinent Vitals/Pain Pain Assessment Pain  Assessment: No/denies pain    Home Living Family/patient expects to be discharged to:: Private residence Living Arrangements:  (son who has Down syndrome) Available Help at Discharge: Family;Available PRN/intermittently;Friend(s) Type of Home:  (townhouse) Home Access: Stairs to enter Entrance Stairs-Rails: None Entrance Stairs-Number of Steps: 1 threshold step    Home Layout: Two level;Able to live on main level with bedroom/bathroom Home Equipment: Kasandra Knudsen - single point;Shower seat - built Medical sales representative (2 wheels)      Prior Function Prior Level of Function : Independent/Modified Independent;Working/employed;Driving             Mobility Comments: Pt was working back in June but is currently on [employer]. Pt lives with son with Down Syndrome who is physically able to care for himself. Pt has an aide 5 hours/day 4-5 days/week & son & neighbor assist PRN. Pt reports only 1 fall in the past 6 months.       Hand Dominance        Extremity/Trunk Assessment   Upper Extremity Assessment Upper Extremity Assessment: Overall WFL for tasks assessed    Lower Extremity Assessment Lower Extremity Assessment: Generalized weakness       Communication   Communication: No difficulties  Cognition Arousal/Alertness: Awake/alert Behavior During Therapy: WFL for tasks assessed/performed Overall Cognitive Status: Within Functional Limits for tasks assessed                                 General Comments: very pleasant lady        General Comments General comments (skin integrity, edema, etc.): PT educates pt on importance of OOB mobility to prevent deconditioning as well as need for assistance to prevent falls as pt has low WBCs & platelets.    Exercises     Assessment/Plan    PT Assessment Patient needs continued PT services  PT Problem List Decreased strength;Decreased range of motion;Decreased activity tolerance;Decreased balance;Decreased mobility;Decreased knowledge of use of DME       PT Treatment Interventions DME instruction;Therapeutic exercise;Gait training;Balance training;Stair training;Neuromuscular re-education;Functional mobility training;Therapeutic activities;Patient/family education    PT Goals (Current goals can be found in the Care Plan section)  Acute Rehab PT Goals Patient Stated Goal: get better, quit  coming into the hospital PT Goal Formulation: With patient Time For Goal Achievement: 12/12/21 Potential to Achieve Goals: Good    Frequency Min 2X/week     Co-evaluation               AM-PAC PT "6 Clicks" Mobility  Outcome Measure Help needed turning from your back to your side while in a flat bed without using bedrails?: None Help needed moving from lying on your back to sitting on the side of a flat bed without using bedrails?: None Help needed moving to and from a bed to a chair (including a wheelchair)?: A Little Help needed standing up from a chair using your arms (e.g., wheelchair or bedside chair)?: None Help needed to walk in hospital room?: A Little Help needed climbing 3-5 steps with a railing? : A Little 6 Click Score: 21    End of Session   Activity Tolerance: Patient tolerated treatment well Patient left: in chair;with chair alarm set;with call bell/phone within reach Nurse Communication: Mobility status PT Visit Diagnosis: Muscle weakness (generalized) (M62.81);Unsteadiness on feet (R26.81)    Time: 9326-7124 PT Time Calculation (min) (ACUTE ONLY): 14 min   Charges:   PT Evaluation $PT Eval Moderate  Complexity: [place], DPT 11/28/21, 2:49 PM   Waunita Schooner 11/28/2021, 2:47 PM

## 2021-11-29 DIAGNOSIS — R531 Weakness: Secondary | ICD-10-CM | POA: Diagnosis not present

## 2021-11-29 LAB — COMPREHENSIVE METABOLIC PANEL
ALT: 35 U/L (ref 0–44)
AST: 29 U/L (ref 15–41)
Albumin: 2.7 g/dL — ABNORMAL LOW (ref 3.5–5.0)
Alkaline Phosphatase: 108 U/L (ref 38–126)
Anion gap: 5 (ref 5–15)
BUN: 8 mg/dL (ref 8–23)
CO2: 26 mmol/L (ref 22–32)
Calcium: 8.5 mg/dL — ABNORMAL LOW (ref 8.9–10.3)
Chloride: 104 mmol/L (ref 98–111)
Creatinine, Ser: 0.47 mg/dL (ref 0.44–1.00)
GFR, Estimated: >60 mL/min (ref >60)
Glucose, Bld: 94 mg/dL (ref 70–99)
Potassium: 3.3 mmol/L — ABNORMAL LOW (ref 3.5–5.1)
Sodium: 135 mmol/L (ref 135–145)
Total Bilirubin: 1.3 mg/dL — ABNORMAL HIGH (ref 0.3–1.2)
Total Protein: 5.3 g/dL — ABNORMAL LOW (ref 6.5–8.1)

## 2021-11-29 LAB — CBC WITH DIFFERENTIAL/PLATELET
Abs Immature Granulocytes: 0 10*3/uL (ref 0.00–0.07)
Abs Immature Granulocytes: 0 10*3/uL (ref 0.00–0.07)
Basophils Absolute: 0 10*3/uL (ref 0.0–0.1)
Basophils Absolute: 0 10*3/uL (ref 0.0–0.1)
Basophils Relative: 0 %
Basophils Relative: 6 %
Eosinophils Absolute: 0 10*3/uL (ref 0.0–0.5)
Eosinophils Absolute: 0 10*3/uL (ref 0.0–0.5)
Eosinophils Relative: 9 %
Eosinophils Relative: 0 %
HCT: 23 % — ABNORMAL LOW (ref 36.0–46.0)
HCT: 24.8 % — ABNORMAL LOW (ref 36.0–46.0)
Hemoglobin: 7.7 g/dL — ABNORMAL LOW (ref 12.0–15.0)
Hemoglobin: 8.6 g/dL — ABNORMAL LOW (ref 12.0–15.0)
Immature Granulocytes: 0 %
Immature Granulocytes: 0 %
Lymphocytes Relative: 75 %
Lymphocytes Relative: 70 %
Lymphs Abs: 0.1 10*3/uL — ABNORMAL LOW (ref 0.7–4.0)
Lymphs Abs: 0.1 10*3/uL — ABNORMAL LOW (ref 0.7–4.0)
MCH: 29.2 pg (ref 26.0–34.0)
MCH: 29.2 pg (ref 26.0–34.0)
MCHC: 33.5 g/dL (ref 30.0–36.0)
MCHC: 34.7 g/dL (ref 30.0–36.0)
MCV: 87.1 fL (ref 80.0–100.0)
MCV: 84.1 fL (ref 80.0–100.0)
Monocytes Absolute: 0 10*3/uL — ABNORMAL LOW (ref 0.1–1.0)
Monocytes Absolute: 0 10*3/uL — ABNORMAL LOW (ref 0.1–1.0)
Monocytes Relative: 8 %
Monocytes Relative: 12 %
Neutro Abs: 0 10*3/uL — CL (ref 1.7–7.7)
Neutro Abs: 0 10*3/uL — CL (ref 1.7–7.7)
Neutrophils Relative %: 8 %
Neutrophils Relative %: 12 %
Platelets: 14 10*3/uL — CL (ref 150–400)
Platelets: 15 10*3/uL — CL (ref 150–400)
RBC: 2.64 MIL/uL — ABNORMAL LOW (ref 3.87–5.11)
RBC: 2.95 MIL/uL — ABNORMAL LOW (ref 3.87–5.11)
RDW: 22.3 % — ABNORMAL HIGH (ref 11.5–15.5)
RDW: 19.7 % — ABNORMAL HIGH (ref 11.5–15.5)
Smear Review: DECREASED
WBC: 0.1 10*3/uL — CL (ref 4.0–10.5)
WBC: 0.2 10*3/uL — CL (ref 4.0–10.5)
nRBC: 0 % (ref 0.0–0.2)
nRBC: 0 % (ref 0.0–0.2)

## 2021-11-29 LAB — PREPARE PLATELET PHERESIS: Unit division: 0

## 2021-11-29 LAB — BPAM PLATELET PHERESIS
Blood Product Expiration Date: 202309102359
ISSUE DATE / TIME: 202309091240
Unit Type and Rh: 7300

## 2021-11-29 MED ORDER — HEPARIN SOD (PORK) LOCK FLUSH 100 UNIT/ML IV SOLN
500.0000 [IU] | INTRAVENOUS | Status: AC | PRN
  Administered 2021-11-29: 500 [IU]

## 2021-11-29 MED ORDER — APIXABAN 5 MG PO TABS: 5.0000 mg | ORAL_TABLET | Freq: Two times a day (BID) | ORAL | 1 refills | Status: DC

## 2021-11-29 NOTE — Plan of Care (Signed)
  Problem: Respiratory: Goal: Ability to maintain adequate ventilation will improve Outcome:on RA   Problem: Clinical Measurements: Goal: Signs and symptoms of infection will decrease Outcome: Adequate for Discharge

## 2021-11-29 NOTE — TOC Progression Note (Addendum)
Transition of Care (TOC) - Progression Note    Patient Details  Name: Stacey Stone MRN: [id number] Date of Birth: 02/10/1946  Transition of Care (TOC) CM/SW Contact  Sarah C Boswell, LCSW Phone Number: 11/29/2021, 11:37 AM  Clinical Narrative:  Met with patient to discuss PT recommendations. She is agreeable to home health and prefers [facility] since she worked with them earlier this summer. Left message for [facility] liaison to see if they can accept.   12:21 pm: [facility] has accepted referral for home health PT. Patient is aware.  Expected Discharge Plan: Home w Home Health Services Barriers to Discharge: Continued Medical Work up  Expected Discharge Plan and Services Expected Discharge Plan: Home w Home Health Services                                               Social Determinants of Health (SDOH) Interventions    Readmission Risk Interventions    11/27/2021    2:30 PM 11/09/2021    1:17 PM  Readmission Risk Prevention Plan  Transportation Screening Complete Complete  PCP or Specialist Appt within 3-5 Days  Complete  HRI or Home Care Consult  Complete  Social Work Consult for Recovery Care Planning/Counseling  Complete  Palliative Care Screening Not Applicable Not Applicable  Medication Review (RN Care Manager) Complete Complete    

## 2021-11-29 NOTE — TOC Transition Note (Signed)
Transition of Care [facility]) - CM/SW Discharge Note   Patient Details  Name: Stacey Stone MRN: [id number] Date of Birth: 09-Oct-1945  Transition of Care [facility]) CM/SW Contact:  Candie Chroman, LCSW Phone Number: 11/29/2021, 1:50 PM   Clinical Narrative:  Patient has orders to discharge home today. Left message for [facility] representative to notify. No further concerns. CSW signing off.   Final next level of care: Home w Home Health Services Barriers to Discharge: Barriers Resolved   Patient Goals and CMS Choice     Choice offered to / list presented to : Patient  Discharge Placement                Patient to be transferred to facility by: Daughter   Patient and family notified of of transfer: 11/29/21  Discharge Plan and Services                          HH Arranged: PT [facility] Agency: [place] Date [place]: 11/29/21   Representative spoke with at [place]: Bobbe Medico  Social Determinants of Health ([place]) Interventions     Readmission Risk Interventions    11/27/2021    2:30 PM 11/09/2021    1:17 PM  Readmission Risk Prevention Plan  Transportation Screening Complete Complete  PCP or Specialist Appt within 3-5 Days  Complete  HRI or [place]  Complete  Social Work Consult for [place] Planning/Counseling  Complete  Palliative Care Screening Not Applicable Not Applicable  Medication Review Press photographer) Complete Complete

## 2021-11-29 NOTE — Discharge Instructions (Signed)
Patient to hold her eliquis till she gets her blood counts down on [facility]. She will discuss with Dr Grayland Ormond and wait for further recommendations.

## 2021-11-29 NOTE — Discharge Summary (Signed)
Physician Discharge Summary   Patient: Stacey Stone MRN: [id number] DOB: [birth year]  Admit date:     11/25/2021  Discharge date: 11/29/21  Discharge Physician: Fritzi Mandes   PCP: Rusty Aus, MD   Recommendations at discharge:   patient will follow-up with Dr. Grayland Ormond on 21st September. She is recommended to hold off on her eliquis CBC is done and platelet counts. Follow-up Dr. Emily Filbert in 1 to 2  Discharge Diagnoses: Principal Problem:   Weakness Active Problems:   DLBCL (diffuse large B cell lymphoma) (HCC)   Pancytopenia, chemotherapy induced(HCC)   Essential hypertension   Hypothyroidism   COVID-19 virus RNA test result positive at limit of detection   Hospital Course: 76 year old female with history of diffuse large B-cell lymphoma undergoing chemotherapy admitted with pancytopenia and generalized weakness.  This was her fourth chemo treatment. She reports she has 2 more chemotherapy sessions coming up and she wants to finish all the treatments as much as possible.  She was admitted last month with febrile neutropenia and pancytopenia after chemotherapy.  She has history of prolonged COVID.  Per infectious disease plan was to continue isolation till 11/27/2021.  She is COVID-positive this admission.  She has no respiratory symptoms.  Pancytopenia chemotherapy induced generalized weakness diffuse large B-cell lymphoma- -- received R-CHOP about 1 week ago -- patient received couple units of platelets and one unit blood transfusion. Labs are stable -- afebrile -- discontinue Levaquin -- source of infection identified -- patient overall feeling well. Discussed with Dr. Alyssa Grove and will discharge patient for outpatient follow-up  Thrombocytopenia secondary to chemotherapy-- received platelet transfusion -- like last admission will hold off patient's eliquis till she gets CBC drawn as outpatient and resumption orders will be given by Dr. Grayland Ormond depending on  platelet count. Patient voice understanding. 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 and patient was instructed, not to drive, operate heavy machinery, perform activities at heights, swimming or participation in water activities or provide baby-sitting services while on Pain, Sleep and Anxiety Medications; until their outpatient Physician has advised to do so again. Also recommended to not to take more than prescribed Pain, Sleep and Anxiety Medications.  Consultants: oncology Dr. Grayland Ormond Procedures performed: none Disposition: Home health Diet recommendation:  Discharge Diet Orders (From admission, onward)     Start     Ordered   11/29/21 0000  Diet - low sodium heart healthy        11/29/21 1331           Cardiac diet DISCHARGE MEDICATION: Allergies as of 11/29/2021       Reactions   Codeine Other (See Comments)   Stroke like symptoms   Tramadol Itching        Medication List     STOP taking these medications    letrozole 2.5 MG tablet Commonly known as: FEMARA   levofloxacin 500 MG tablet Commonly known as: Levaquin   predniSONE 20 MG tablet Commonly known as: DELTASONE       TAKE these medications    acetaminophen 500 MG tablet Commonly known as: TYLENOL Take 500 mg by mouth every 4 (four) hours as needed.   amLODipine 10 MG tablet Commonly known as: NORVASC Take 1 tablet (10 mg total) by  mouth daily.   apixaban 5 MG Tabs tablet Commonly known as: ELIQUIS Take 1 tablet (5 mg total) by mouth 2 (two) times daily. HOLD till you get the Blood work done on 12/10/21 and d/w Dr Grayland Ormond to see if you can restart it Start taking on: December 11, 2021 What changed:  additional instructions These instructions start on December 11, 2021. If you are unsure what to do until then, ask your doctor or other care provider.   atorvastatin 40 MG tablet Commonly known as: LIPITOR Take 1 tablet (40 mg total) by mouth at bedtime.   famotidine 40 MG tablet Commonly known as: PEPCID Take 40 mg by mouth at bedtime.   Fluticasone-Salmeterol 100-50 MCG/DOSE Aepb Commonly known as: ADVAIR Inhale 1 puff into the lungs 2 (two) times daily as needed (for asthma.).   Ipratropium-Albuterol 20-100 MCG/ACT Aers respimat Commonly known as: COMBIVENT Inhale 1 puff into the lungs 4 (four) times daily as needed for wheezing.   isosorbide mononitrate 30 MG 24 hr tablet Commonly known as: IMDUR Take 30 mg by mouth daily.   levothyroxine 125 MCG tablet Commonly known as: SYNTHROID Take 125 mcg by mouth daily.   magic mouthwash (nystatin, hydrocortisone, diphenhydrAMINE) suspension Swish and spit 5 mLs 3 (three) times daily as needed for mouth pain.   nitroGLYCERIN 0.4 MG SL tablet Commonly known as: NITROSTAT Place 0.4 mg under the tongue every 5 (five) minutes as needed for chest pain. Maximum 3 doses, If no relief call md or 911.   potassium chloride 10 MEQ tablet Commonly known as: KLOR-CON Take 1 tablet (10 mEq total) by mouth 2 (two) times daily.   RABEprazole 20 MG tablet Commonly known as: ACIPHEX Take 20 mg by mouth daily.   sertraline 50 MG tablet Commonly known as: ZOLOFT Take 50 mg by mouth at bedtime.   sotalol 80 MG tablet Commonly known as: BETAPACE Take 80 mg by mouth 2 (two) times daily.   torsemide 20 MG tablet Commonly known as: DEMADEX Take 10 mg by mouth as directed. Take on Tuesday and Friday        Follow-up [facility].   Why: They will follow up with you for your home health needs.        Rusty Aus, MD. Schedule an appointment as soon as possible for a visit in 1 week(s).   Specialty: Internal Medicine Contact information: [address] [place] [address] [phone]         Lloyd Huger, MD. Go to.   Specialty: Oncology Why: On your scheduled appointment. Contact information: [place] [address] [phone]                Discharge Exam: Danley Danker Weights   11/25/21 2244 11/26/21 1628  Weight: 73.9 kg 73.2 kg     Condition at discharge: fair  The results of significant diagnostics from this hospitalization (including imaging, microbiology, ancillary and laboratory) are listed below for reference.   Imaging Studies: DG Abd 1 View  Result Date: 11/26/2021 CLINICAL DATA:  Nausea and vomiting today. EXAM: ABDOMEN - 1 VIEW COMPARISON:  None Available. FINDINGS: Bowel gas pattern is nonobstructive. Average stool burden. No organomegaly or abnormal calcifications. IMPRESSION: No evidence for acute  abnormality. Electronically Signed   By: Nolon Nations M.D.   On: 11/26/2021 13:30   DG Chest 1 View  Result Date: 11/25/2021 CLINICAL DATA:  Weakness, fatigue EXAM: CHEST  1 VIEW COMPARISON:  None Available. FINDINGS: Lungs are clear. No pneumothorax or pleural effusion. Cardiac size within normal limits. Right internal jugular chest port tip seen within the superior cavoatrial junction. Pulmonary vascularity is normal. No acute bone abnormality. IMPRESSION: No radiographic evidence of acute cardiopulmonary disease. Electronically Signed   By: Fidela Salisbury M.D.   On: 11/25/2021 23:03   DG Chest Port 1 View  Result Date: 11/08/2021 CLINICAL DATA:  Follow-up pneumonia. EXAM: PORTABLE CHEST 1 VIEW COMPARISON:  11/06/2021 FINDINGS: There is a right chest wall port a catheter with tip at the cavoatrial junction. Heart size appears normal scratch set stable cardiomediastinal contours. No pleural effusion or edema. Streaky peribronchial opacities within the left base identified which appears stable to slightly increased from previous exam. IMPRESSION:  Streaky peribronchial opacities within the left base, stable to slightly increased from previous exam. Electronically Signed   By: Kerby Moors M.D.   On: 11/08/2021 07:09   DG Chest Port 1 View  Result Date: 11/06/2021 CLINICAL DATA:  Questionable sepsis - evaluate for abnormality EXAM: PORTABLE CHEST 1 VIEW COMPARISON:  Chest x-ray 09/23/2021, CT chest 11/16/2016 FINDINGS: Accessed right Port-A-Cath with tip overlying the distal superior vena cava. The heart and mediastinal contours are unchanged. Atherosclerotic plaque. No focal consolidation. No pulmonary edema. No pleural effusion. No pneumothorax. No acute osseous abnormality. IMPRESSION: 1. No active disease. 2. Aortic Atherosclerosis (ICD10-I70.0). Electronically Signed   By: Iven Finn M.D.   On: 11/06/2021 20:59    Microbiology: Results for orders placed or performed during the hospital encounter of 11/25/21  Resp Panel by RT-PCR (Flu A&B, Covid) Anterior Nasal Swab     Status: Abnormal   Collection Time: 11/25/21 11:20 PM   Specimen: Anterior Nasal Swab  Result Value Ref Range Status   SARS Coronavirus 2 by RT PCR POSITIVE (A) NEGATIVE Final    Comment: (NOTE) SARS-CoV-2 target nucleic acids are DETECTED.  The SARS-CoV-2 RNA is generally detectable in upper respiratory specimens during the acute phase of infection. Positive results are indicative of the presence of the identified virus, but do not rule out bacterial infection or co-infection with other pathogens not detected by the test. Clinical correlation with patient history and other diagnostic information is necessary to determine patient infection status. The expected result is Negative.  Fact Sheet for Patients: EntrepreneurPulse.com.au  Fact Sheet for Healthcare Providers: IncredibleEmployment.be  This test is not yet approved or cleared by the [place] FDA and  has been authorized for detection and/or diagnosis of  SARS-CoV-2 by FDA under an Emergency Use Authorization (EUA).  This EUA will remain in effect (meaning this test can be used) for the duration of  the COVID-19 declaration under Section 564(b)(1) of the A ct, 21 U.S.C. section 360bbb-3(b)(1), unless the authorization is terminated or revoked sooner.     Influenza A by PCR NEGATIVE NEGATIVE Final   Influenza B by PCR NEGATIVE NEGATIVE Final    Comment: (NOTE) The Xpert Xpress SARS-CoV-2/FLU/RSV plus assay is intended as an aid in the diagnosis of influenza from Nasopharyngeal swab specimens and should not be used as a sole basis for treatment. Nasal washings and aspirates are unacceptable for Xpert Xpress SARS-CoV-2/FLU/RSV testing.  Fact Sheet for Patients: EntrepreneurPulse.com.au  Fact Sheet for Healthcare Providers: IncredibleEmployment.be  This test is not yet approved or cleared by the [place] FDA and has been authorized for detection and/or diagnosis of SARS-CoV-2 by FDA under an Emergency Use Authorization (EUA).  This EUA will remain in effect (meaning this test can be used) for the duration of the COVID-19 declaration under Section 564(b)(1) of the Act, 21 U.S.C. section 360bbb-3(b)(1), unless the authorization is terminated or revoked.  Performed at [facility], [address] [place], [place] [address]   Culture, blood (routine x 2)     Status: None (Preliminary result)   Collection Time: 11/25/21 11:20 PM   Specimen: BLOOD  Result Value Ref Range Status   Specimen Description BLOOD PORTA CATH  Final   Special Requests   Final    BOTTLES DRAWN AEROBIC AND ANAEROBIC Blood Culture adequate volume   Culture   Final    NO GROWTH 4 DAYS Performed at [facility], [address] [place], [place] [address]    Report Status PENDING  Incomplete  Culture, blood (routine x 2)     Status: None (Preliminary result)   Collection Time: 11/25/21 11:20 PM    Specimen: BLOOD  Result Value Ref Range Status   Specimen Description BLOOD RIGHT WRIST  Final   Special Requests   Final    BOTTLES DRAWN AEROBIC AND ANAEROBIC Blood Culture adequate volume   Culture   Final    NO GROWTH 3 DAYS Performed at [facility], [address] [place], [place] [address]    Report Status PENDING  Incomplete    Labs: CBC: Recent Labs  Lab 11/25/21 2320 11/26/21 0446 11/27/21 0450 11/27/21 1830 11/28/21 0512 11/29/21 0457  WBC 0.2* 0.2* 0.1* 0.2* 0.2* 0.2*  NEUTROABS 0.0*  --  0.0*  --  0.0* 0.0*  HGB 9.2* 8.9* 7.7* 9.1* 8.5* 8.6*  HCT 28.5* 27.5* 23.0* 26.4* 25.3* 24.8*  MCV 88.2 88.4 87.1 84.3 84.3 84.1  PLT 24* 21* 14* 15* 12* 15*   Basic Metabolic Panel: Recent Labs  Lab 11/25/21 2320 11/27/21 0450 11/28/21 0512 11/29/21 0457  NA 138 137 136 135  K 3.4* 3.7 3.6 3.3*  CL 101 109 106 104  CO2 '27 24 23 26  '$ GLUCOSE 121* 97 99 94  BUN '15 10 8 8  '$ CREATININE 0.46 0.39* 0.40* 0.47  CALCIUM 9.1 8.3* 8.4* 8.5*   Liver Function Tests: Recent Labs  Lab 11/25/21 2320 11/27/21 0450 11/28/21 0512 11/29/21 0457  AST '31 25 25 29  '$ ALT 40 36 31 35  ALKPHOS 94 90 99 108  BILITOT 1.2 1.0 1.1 1.3*  PROT 5.7* 5.1* 5.2* 5.3*  ALBUMIN 3.1* 2.6* 2.6* 2.7*   CBG: No results for input(s): "GLUCAP" in the last 168 hours.  Discharge time spent: greater than 30 minutes.  Signed: Fritzi Mandes, MD [facility] 11/29/2021

## 2021-11-30 LAB — CULTURE, BLOOD (ROUTINE X 2)
Culture: NO GROWTH
Special Requests: ADEQUATE

## 2021-12-01 ENCOUNTER — Other Ambulatory Visit: Payer: 59

## 2021-12-01 ENCOUNTER — Ambulatory Visit
Admit: 2021-12-01 | Discharge: 2021-12-01 | Disposition: A | Discharge status: Home / Self Care | Payer: No Typology Code available for payment source | Attending: Oncology | Admitting: Oncology

## 2021-12-01 DIAGNOSIS — R0602 Shortness of breath: Secondary | ICD-10-CM

## 2021-12-01 LAB — CULTURE, BLOOD (ROUTINE X 2)
Culture: NO GROWTH
Special Requests: ADEQUATE

## 2021-12-01 LAB — ECHOCARDIOGRAM COMPLETE
AR max vel: 2.24 cm2
AV Area VTI: 2.7 cm2
AV Area mean vel: 2.37 cm2
AV Mean grad: 4 mmHg
AV Peak grad: 7 mmHg
Ao pk vel: 1.32 m/s
Area-P 1/2: 3.66 cm2

## 2021-12-01 NOTE — Progress Notes (Signed)
*  PRELIMINARY RESULTS* Echocardiogram 2D Echocardiogram has been performed.  Sherrie Sport 12/01/2021, 9:59 AM

## 2021-12-01 NOTE — Progress Notes (Signed)
*  PRELIMINARY RESULTS* Echocardiogram 2D Echocardiogram has been performed.  Sherrie Sport 12/01/2021, 9:55 AM

## 2021-12-03 ENCOUNTER — Other Ambulatory Visit: Payer: 59

## 2021-12-03 ENCOUNTER — Ambulatory Visit: Payer: 59

## 2021-12-03 ENCOUNTER — Ambulatory Visit: Payer: 59 | Admitting: Oncology

## 2021-12-07 ENCOUNTER — Encounter (INDEPENDENT_AMBULATORY_CARE_PROVIDER_SITE_OTHER): Payer: 59 | Admitting: Ophthalmology

## 2021-12-08 ENCOUNTER — Ambulatory Visit
Admission: RE | Admit: 2021-12-08 | Discharge: 2021-12-08 | Disposition: A | Discharge status: Home / Self Care | Payer: No Typology Code available for payment source | Source: Ambulatory Visit | Attending: Oncology | Admitting: Oncology

## 2021-12-08 VITALS — Ht 62.0 in | Wt 156.0 lb

## 2021-12-08 DIAGNOSIS — R911 Solitary pulmonary nodule: Secondary | ICD-10-CM | POA: Insufficient documentation

## 2021-12-08 DIAGNOSIS — C8338 Diffuse large B-cell lymphoma, lymph nodes of multiple sites: Secondary | ICD-10-CM | POA: Insufficient documentation

## 2021-12-08 LAB — GLUCOSE, CAPILLARY: Glucose-Capillary: 110 mg/dL — ABNORMAL HIGH (ref 70–99)

## 2021-12-08 MED ORDER — FLUDEOXYGLUCOSE F - 18 (FDG) INJECTION
8.3000 | Freq: Once | INTRAVENOUS | Status: AC | PRN
  Administered 2021-12-08: 8.82 via INTRAVENOUS

## 2021-12-09 ENCOUNTER — Other Ambulatory Visit: Payer: Self-pay

## 2021-12-09 MED FILL — Dexamethasone Sodium Phosphate Inj 100 MG/10ML: INTRAMUSCULAR | Qty: 1 | Status: AC

## 2021-12-09 MED FILL — Fosaprepitant Dimeglumine For IV Infusion 150 MG (Base Eq): INTRAVENOUS | Qty: 5 | Status: AC

## 2021-12-10 ENCOUNTER — Telehealth: Payer: Self-pay

## 2021-12-10 ENCOUNTER — Inpatient Hospital Stay: Payer: No Typology Code available for payment source

## 2021-12-10 ENCOUNTER — Encounter: Payer: Self-pay | Admitting: Oncology

## 2021-12-10 ENCOUNTER — Inpatient Hospital Stay (HOSPITAL_BASED_OUTPATIENT_CLINIC_OR_DEPARTMENT_OTHER): Payer: No Typology Code available for payment source | Admitting: Oncology

## 2021-12-10 ENCOUNTER — Ambulatory Visit: Payer: 59 | Admitting: Oncology

## 2021-12-10 ENCOUNTER — Ambulatory Visit: Payer: 59

## 2021-12-10 ENCOUNTER — Other Ambulatory Visit: Payer: 59

## 2021-12-10 VITALS — BP 73/61 | HR 80 | Temp 98.1°F | Ht 62.0 in | Wt 158.4 lb

## 2021-12-10 VITALS — BP 103/63 | HR 67 | Resp 18

## 2021-12-10 DIAGNOSIS — C8338 Diffuse large B-cell lymphoma, lymph nodes of multiple sites: Secondary | ICD-10-CM

## 2021-12-10 DIAGNOSIS — E860 Dehydration: Secondary | ICD-10-CM

## 2021-12-10 DIAGNOSIS — D709 Neutropenia, unspecified: Secondary | ICD-10-CM | POA: Diagnosis not present

## 2021-12-10 DIAGNOSIS — J3489 Other specified disorders of nose and nasal sinuses: Secondary | ICD-10-CM

## 2021-12-10 LAB — CBC WITH DIFFERENTIAL/PLATELET
Abs Immature Granulocytes: 0.39 10*3/uL — ABNORMAL HIGH (ref 0.00–0.07)
Basophils Absolute: 0.1 10*3/uL (ref 0.0–0.1)
Basophils Relative: 1 %
Eosinophils Absolute: 0 10*3/uL (ref 0.0–0.5)
Eosinophils Relative: 0 %
HCT: 28.7 % — ABNORMAL LOW (ref 36.0–46.0)
Hemoglobin: 9.5 g/dL — ABNORMAL LOW (ref 12.0–15.0)
Immature Granulocytes: 6 %
Lymphocytes Relative: 8 %
Lymphs Abs: 0.6 10*3/uL — ABNORMAL LOW (ref 0.7–4.0)
MCH: 30.2 pg (ref 26.0–34.0)
MCHC: 33.1 g/dL (ref 30.0–36.0)
MCV: 91.1 fL (ref 80.0–100.0)
Monocytes Absolute: 0.8 10*3/uL (ref 0.1–1.0)
Monocytes Relative: 11 %
Neutro Abs: 5.1 10*3/uL (ref 1.7–7.7)
Neutrophils Relative %: 74 %
Platelets: 342 10*3/uL (ref 150–400)
RBC: 3.15 MIL/uL — ABNORMAL LOW (ref 3.87–5.11)
RDW: 22.5 % — ABNORMAL HIGH (ref 11.5–15.5)
WBC: 7 10*3/uL (ref 4.0–10.5)
nRBC: 0.7 % — ABNORMAL HIGH (ref 0.0–0.2)

## 2021-12-10 LAB — COMPREHENSIVE METABOLIC PANEL
ALT: 20 U/L (ref 0–44)
AST: 31 U/L (ref 15–41)
Albumin: 3.2 g/dL — ABNORMAL LOW (ref 3.5–5.0)
Alkaline Phosphatase: 123 U/L (ref 38–126)
Anion gap: 8 (ref 5–15)
BUN: 10 mg/dL (ref 8–23)
CO2: 24 mmol/L (ref 22–32)
Calcium: 9.3 mg/dL (ref 8.9–10.3)
Chloride: 105 mmol/L (ref 98–111)
Creatinine, Ser: 0.71 mg/dL (ref 0.44–1.00)
GFR, Estimated: >60 mL/min (ref >60)
Glucose, Bld: 151 mg/dL — ABNORMAL HIGH (ref 70–99)
Potassium: 3.5 mmol/L (ref 3.5–5.1)
Sodium: 137 mmol/L (ref 135–145)
Total Bilirubin: 0.5 mg/dL (ref 0.3–1.2)
Total Protein: 6.7 g/dL (ref 6.5–8.1)

## 2021-12-10 MED ORDER — HEPARIN SOD (PORK) LOCK FLUSH 100 UNIT/ML IV SOLN
INTRAVENOUS | Status: AC
  Filled 2021-12-10: qty 5

## 2021-12-10 MED ORDER — SODIUM CHLORIDE 0.9 % IV SOLN
INTRAVENOUS | Status: DC
  Filled 2021-12-10 (×2): qty 250

## 2021-12-10 MED ORDER — SODIUM CHLORIDE 0.9 % IV SOLN
Freq: Once | INTRAVENOUS | Status: AC
  Filled 2021-12-10: qty 250

## 2021-12-10 MED ORDER — HEPARIN SOD (PORK) LOCK FLUSH 100 UNIT/ML IV SOLN
500.0000 [IU] | Freq: Once | INTRAVENOUS | Status: DC | PRN
  Filled 2021-12-10: qty 5

## 2021-12-10 NOTE — Telephone Encounter (Signed)
Referral faxed over to [facility] for sinus mass seen on recent CT scan.

## 2021-12-10 NOTE — Progress Notes (Signed)
No treatment today per Dr. Grayland Ormond.  Pt given 2 liters IVF today per Dr. Grayland Ormond.

## 2021-12-10 NOTE — Progress Notes (Signed)
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  INTERVAL HISTORY: Patient returns to clinic today for further evaluation and consideration of cycle 5 of R-CHOP.  She continues to have chronic weakness and fatigue.  She also complains of intermittent dizziness today.  She has no further fevers.  She denies any night sweats or weight loss.  She has other no neurologic complaints.  She denies any chest pain, cough, shortness of breath, or hemoptysis.  She denies any nausea, vomiting, constipation, or diarrhea. She has no urinary complaints.  Patient has no further specific complaints today.  REVIEW OF SYSTEMS:   Review of Systems  Constitutional:  Positive for malaise/fatigue. Negative for chills, diaphoresis, fever and weight loss.  Respiratory: Negative.  Negative for cough and shortness of breath.   Cardiovascular: Negative.  Negative for chest pain and leg swelling.  Gastrointestinal: Negative.  Negative for abdominal pain.  Genitourinary: Negative.  Negative for dysuria.  Musculoskeletal: Negative.  Negative for back pain.  Skin: Negative.  Negative for rash.  Neurological:  Positive for dizziness and weakness. Negative for sensory change, focal weakness and headaches.  Psychiatric/Behavioral: Negative.  The patient is not nervous/anxious.     As per HPI. Otherwise, a complete review of systems is negative.  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 MammaPrint was reported as low risk, therefore she did not require adjuvant chemotherapy. She completed adjuvant XRT. Will complete 5 year of letrozole in July 2023.   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  Patient's most recent CT scan on April 28, 2018 revealed no evidence of progressive or recurrent disease.    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)  09/10/2021 Initial Diagnosis   DLBCL (diffuse large B cell lymphoma) ([place])   09/17/2021 - 10/30/2021 Chemotherapy   Patient is on Treatment Plan : NON-HODGKINS LYMPHOMA R-CHOP q21d     09/17/2021 -  Chemotherapy   Patient is on Treatment Plan : NON-HODGKINS LYMPHOMA R-CHOP q21d     09/29/2021 Cancer Staging   Staging form: Hodgkin and Non-Hodgkin Lymphoma, AJCC 8th Edition - Clinical stage from 09/29/2021: Stage III (Diffuse large B-cell lymphoma) - Signed by Lloyd Huger, MD on 09/29/2021 Stage prefix: Initial diagnosis     PAST MEDICAL HISTORY: Past Medical History:  Diagnosis Date   Abdominal pain, left lower quadrant    epiploic appendagitis   Anginal pain ([place])    Arthritis 06/06/2018   knee   Asthma    mild   Atrial fibrillation (HCC)    Breast cancer (HCC)    Coronary artery disease 06/06/2018   1 artery 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 Mamie Nick. at [facility] in [place]  Depression    Diabetes mellitus without complication (HCC)    Dyspnea    Dysrhythmia    GERD (gastroesophageal reflux disease)    Hypertension    Hypothyroidism    MALT (mucosa associated lymphoid tissue) (HCC) 07/05/2014   Right neck mass resected 01/2014.   No pertinent past  medical history    Pancytopenia (HCC)    Personal history of radiation therapy    Sleep apnea 06/06/2018   O2- 2l at bedtime and BiPap @ bedtime    PAST SURGICAL HISTORY: Past Surgical History:  Procedure Laterality Date   BREAST BIOPSY Left 2/136/2018   INVASIVE MAMMARY CARCINOMA   BREAST BIOPSY Right 05/25/2016   FIBROCYSTIC CHANGE WITH CALCIFICATIONS    BREAST BIOPSY Right 05/29/2020   Affirm bx-"X" clip-FIBROADENOMA WITH ASSOCIATED CALCIFICATIONS. - NEGATIVE   BREAST EXCISIONAL BIOPSY Left 06/04/2016   INVASIVE MAMMARY CARCINOMA.    BREAST LUMPECTOMY Left 06/04/2016   INVASIVE MAMMARY CARCINOMA.    CARDIAC CATHETERIZATION     CARDIAC CATHETERIZATION N/A 09/24/2015   Procedure: Left Heart Cath and Coronary Angiography;  Surgeon: Alexander Paraschos, MD;  Location: [facility] INVASIVE CV LAB;  Service: Cardiovascular;  Laterality: N/A;   CARDIAC CATHETERIZATION N/A 09/24/2015   Procedure: Coronary Stent Intervention;  Surgeon: Alexander Paraschos, MD;  Location: [facility] INVASIVE CV LAB;  Service: Cardiovascular;  Laterality: N/A;   CHOLECYSTECTOMY  11/26   08/1970   COLONOSCOPY WITH PROPOFOL N/A 07/08/2021   Procedure: COLONOSCOPY WITH PROPOFOL;  Surgeon: Toledo, Teodoro K, MD;  Location: [facility] ENDOSCOPY;  Service: Gastroenterology;  Laterality: N/A;   DILATATION & CURETTAGE/HYSTEROSCOPY WITH MYOSURE N/A 05/05/2015   Procedure: Hysteroscopy and endometrial curretage;  Surgeon: Thomas J Schermerhorn, MD;  Location: [facility] ORS;  Service: Gynecology;  Laterality: N/A;   DILATION AND CURETTAGE OF UTERUS     EXCISION MASS FROM NECK  Right    Dr. McQueen   EYE SURGERY  05/29/2020   bil cataract 9/08   HAMMER TOE SURGERY Right    IR BONE MARROW BIOPSY & ASPIRATION  09/09/2021   IR IMAGING GUIDED PORT INSERTION  09/09/2021   JOINT REPLACEMENT Bilateral 2008, 11/26/ 2012   Partial Knee Replacements   LEFT HEART CATH AND CORONARY ANGIOGRAPHY N/A 10/25/2017   Procedure: LEFT HEART CATH AND CORONARY  ANGIOGRAPHY;  Surgeon: Fath, Kenneth A, MD;  Location: [facility] INV[facility]IVE CV LAB;  Service: Cardiovascular;  Laterality: N/A;   LEFT HEART CATH AND CORONARY ANGIOGRAPHY N/A 10/26/2017   Procedure: LEFT HEART CATH AND CORONARY ANGIOGRAPHY;  Surgeon: Paraschos, Alexander, MD;  Location: [facility] CV [facility];  Service: Cardiovascular;  Laterality: N/A;   PARTIAL MASTECTOMY WITH NEEDLE LOCALIZATION Left 06/04/2016   Procedure: PARTIAL MASTECTOMY WITH NEEDLE LOCALIZATION;  Surgeon: Jarvis Wilton Smith, MD;  Location: [facility] OR[facility];  Service: General;  Laterality: Left;   SCLERAL BUCKLE  02/16/2011   Procedure: SCLERAL BUCKLE;  Surgeon: John D Matthews, MD;  Location: [facility];  Service: Ophthalmology;  Laterality: Left;  Scleral Buckle Left Eye with Headscope Laser   SENTINEL NODE BIOPSY Left 06/04/2016   Procedure: SENTINEL NODE BIOPSY;  Surgeon: Jarvis Wilton Smith, MD;  Location: [facility] OR[facility];  Service: General;  Laterality: Left;    FAMILY HISTORY Family History  Problem Relation Age of Onset   Breast cancer Mother 42   Heart disease Father    Breast cancer Paternal Aunt 73   Breast cancer Cousin    Anesthesia problems Neg Hx    Hypotension Neg Hx    Malignant hyperthermia Neg Hx      Pseudochol deficiency Neg Hx     GYNECOLOGIC HISTORY:  No LMP recorded. Patient is postmenopausal.     ADVANCED DIRECTIVES:    HEALTH MAINTENANCE: Social History   Tobacco Use   Smoking status: Never   Smokeless tobacco: Never  Vaping Use   Vaping Use: Never used  Substance Use Topics   Alcohol use: No   Drug use: No     Colonoscopy:  PAP:  Bone density:  Mammogram: November 2016  Allergies  Allergen Reactions   Codeine Other (See Comments)    Stroke like symptoms   Tramadol Itching    Current Outpatient Medications  Medication Sig Dispense Refill   acetaminophen (TYLENOL) 500 MG tablet Take 500 mg by mouth every 4 (four) hours as needed.     amLODipine (NORVASC) 10 MG tablet Take 1 tablet (10 mg  total) by mouth daily. 30 tablet 0   [START ON 12/11/2021] apixaban (ELIQUIS) 5 MG TABS tablet Take 1 tablet (5 mg total) by mouth 2 (two) times daily. HOLD till you get the Blood work done on 12/10/21 and d/w Dr Grayland Ormond to see if you can restart it 60 tablet 1   atorvastatin (LIPITOR) 40 MG tablet Take 1 tablet (40 mg total) by mouth at bedtime.     famotidine (PEPCID) 40 MG tablet Take 40 mg by mouth at bedtime.     Fluticasone-Salmeterol (ADVAIR) 100-50 MCG/DOSE AEPB Inhale 1 puff into the lungs 2 (two) times daily as needed (for asthma.).     isosorbide mononitrate (IMDUR) 30 MG 24 hr tablet Take 30 mg by mouth daily.     levothyroxine (SYNTHROID) 125 MCG tablet Take 125 mcg by mouth daily.     loratadine (CLARITIN) 10 MG tablet Take 10 mg by mouth daily.     magic mouthwash (nystatin, hydrocortisone, diphenhydrAMINE) suspension Swish and spit 5 mLs 3 (three) times daily as needed for mouth pain. 540 mL 0   nitroGLYCERIN (NITROSTAT) 0.4 MG SL tablet Place 0.4 mg under the tongue every 5 (five) minutes as needed for chest pain. Maximum 3 doses, If no relief call md or 911.     potassium chloride (KLOR-CON) 10 MEQ tablet Take 1 tablet (10 mEq total) by mouth 2 (two) times daily.     predniSONE (DELTASONE) 20 MG tablet Take by mouth.     RABEprazole (ACIPHEX) 20 MG tablet Take 20 mg by mouth daily.     sertraline (ZOLOFT) 50 MG tablet Take 50 mg by mouth at bedtime.     sotalol (BETAPACE) 80 MG tablet Take 80 mg by mouth 2 (two) times daily.     Ipratropium-Albuterol (COMBIVENT) 20-100 MCG/ACT AERS respimat Inhale 1 puff into the lungs 4 (four) times daily as needed for wheezing. (Patient not taking: Reported on 12/10/2021) 1 each 1   torsemide (DEMADEX) 20 MG tablet Take 10 mg by mouth as directed. Take on Tuesday and Friday (Patient not taking: Reported on 12/10/2021)     No current facility-administered medications for this visit.   Facility-Administered Medications Ordered in Other Visits   Medication Dose Route Frequency Provider Last Rate Last Admin   0.9 %  sodium chloride infusion   Intravenous Continuous Lloyd Huger, MD   Stopped at 12/10/21 1253   acetaminophen (TYLENOL) 325 MG tablet            diphenhydrAMINE (BENADRYL) 25 mg capsule            heparin lock flush 100 UNIT/ML injection  heparin lock flush 100 UNIT/ML injection            heparin lock flush 100 unit/mL  500 Units Intracatheter Once PRN Covington, Sarah M, PA-C       palonosetron (ALOXI) 0.25 MG/5ML injection             OBJECTIVE: BP (!) 73/61 (BP Location: Right Arm, Patient Position: Sitting, Cuff Size: Normal)   Pulse 80   Temp 98.1 F (36.7 C) (Tympanic)   Ht 5' 2" (1.575 m)   Wt 158 lb 6.4 oz (71.8 kg)   SpO2 100%   BMI 28.97 kg/m    Body mass index is 28.97 kg/m.    ECOG FS:1 - Symptomatic but completely ambulatory  General: Well-developed, well-nourished, no acute distress. Eyes: Pink conjunctiva, anicteric sclera. HEENT: Normocephalic, moist mucous membranes. Lungs: No audible wheezing or coughing. Heart: Regular rate and rhythm. Abdomen: Soft, nontender, no obvious distention. Musculoskeletal: No edema, cyanosis, or clubbing. Neuro: Alert, answering all questions appropriately. Cranial nerves grossly intact. Skin: No rashes or petechiae noted. Psych: Normal affect.  LAB RESULTS:  CBC    Component Value Date/Time   WBC 7.0 12/10/2021 0917   RBC 3.15 (L) 12/10/2021 0917   HGB 9.5 (L) 12/10/2021 0917   HGB 13.3 07/03/2014 1503   HCT 28.7 (L) 12/10/2021 0917   HCT 40.8 07/03/2014 1503   PLT 342 12/10/2021 0917   PLT 240 07/03/2014 1503   MCV 91.1 12/10/2021 0917   MCV 80 07/03/2014 1503   MCH 30.2 12/10/2021 0917   MCHC 33.1 12/10/2021 0917   RDW 22.5 (H) 12/10/2021 0917   RDW 15.3 (H) 07/03/2014 1503   LYMPHSABS 0.6 (L) 12/10/2021 0917   LYMPHSABS 2.8 07/03/2014 1503   MONOABS 0.8 12/10/2021 0917   MONOABS 0.5 07/03/2014 1503   EOSABS 0.0 12/10/2021  0917   EOSABS 0.3 07/03/2014 1503   BASOSABS 0.1 12/10/2021 0917   BASOSABS 0.1 07/03/2014 1503     STUDIES: NM PET Image Restag (PS) Skull Base To Thigh  Result Date: 12/09/2021 CLINICAL DATA:  Subsequent treatment strategy for diffuse large B-cell lymphoma. EXAM: NUCLEAR MEDICINE PET SKULL BASE TO THIGH TECHNIQUE: 8.8 mCi F-18 FDG was injected intravenously. Full-ring PET imaging was performed from the skull base to thigh after the radiotracer. CT data was obtained and used for attenuation correction and anatomic localization. Fasting blood glucose: 110 mg/dl COMPARISON:  PET-CT 08/18/2021 FINDINGS: Mediastinal blood pool activity: SUV max 1.6 Liver activity: SUV max 3.2 NECK: Previously enlarged and hypermetabolic RIGHT cervical and supraclavicular nodes are no longer measurable. No residual metabolic activity. There is intense residual metabolic activity associated mucosal thickening in the anterior RIGHT maxillary sinus with SUV max equal 16 compared SUV max equal 23. Lesion measures 13 x 6 mm (image 18/2) compared to 17 x 9 mm. Incidental CT findings: None. CHEST: Interval resolution LEFT size and metabolic activity of mediastinal lymph nodes. No residual enlarged or metabolically active mediastinal lymph nodes. Previously seen RIGHT lower lobe peribronchial nodule measures 19 mm (84/series 2) compared to 27 mm. This nodule has low metabolic activity with SUV max equal 1.9 decreased from SUV max equal 15.8. Incidental CT findings: Port in the anterior chest wall with tip in distal SVC. ABDOMEN/PELVIS: Interval resolution of the previously enlarged and hypermetabolic periaortic lymph nodes. No residual adenopathy. For example 5 mm LEFT periaortic node decreased from 18 mm with SUV max equal 0.8 decreased from SUV max equal 14.8. Normal spleen with normal radiotracer activity. Resolution   of focal activity. Incidental CT findings: Atherosclerotic calcification of the aorta. Uterus and adnexa  unremarkable. SKELETON: Uniform mild increase in marrow activity without focality. Incidental CT findings: None. IMPRESSION: 1. Marked reduction in size and metabolic activity of previous seen cervical, supraclavicular, mediastinal improved retroperitoneal nodes. No residual adenopathy or metabolic activity. [place] 1 2. RIGHT lobe pulmonary nodule with very low metabolic activity and decreased in size. [place] 2 3. Mild uniform increase in marrow activity is consistent with a GCSF type marrow response. 4. Intense uptake within the polypoid lesion in the anterior RIGHT maxillary sinus. Lesion decreased slightly in size and metabolic activity. Favor lesion unrelated to lymphoma as such a dramatic response at other sites of disease as well as atypical location. Probable inflammatory or neoplastic polypoid lesion. Recommend MRI of the sinuses with contrast for further evaluation. These results will be called to the ordering clinician or representative by the Radiologist Assistant, and communication documented in the PACS or [facility] Dashboard. Electronically Signed   By: Stewart  Edmunds M.D.   On: 12/09/2021 11:53    ASSESSMENT: Stage III diffuse large B-cell lymphoma.  PLAN:    Stage III diffuse large B-cell lymphoma: PET scan and biopsy results reviewed independently.  Case discussed with pathology confirming this is not a recurrence of patient's MALT lymphoma.  Bone marrow biopsy did not reveal any evidence of lymphoma.  MUGA scan completed on September 15, 2021 revealed an EF of 75%.  Echocardiogram on December 01, 2021 revealed an EF of 60 to 65%. Repeat PET scan results from December 09, 2021 reviewed independently and reported as above with complete metabolic response of patient's lymphoma.  Agree that sinus lesion is unrelated and a referral has been sent back to ENT.  Given patient's persistent weakness and fatigue, have recommended completing the final 2 cycles of chemotherapy, but dropping Adriamycin  and using only R-CVP.  No treatment today secondary to hypotension.  Return to clinic in 1 week for further evaluation and reconsideration of cycle 5.   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 MammaPrint was reported as low risk, therefore she did not require adjuvant chemotherapy. She completed adjuvant XRT.  Patient completed 5 years of letrozole in June 2023.  Her most recent mammogram on May 20, 2021 was reported as BI-RADS 1.  Repeat in March 2024.   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  Case discussed with pathology.  This is a different primary.   Left renal lesion: CT scan results from December 15, 2020 with only mild increase of benign renal lesion to 13 mm.  No intervention is needed at this time. Bone mineral density: Patient's most recent bone mineral density on November 13, 2020 revealed a T score of -2.0 which is slightly worse than 1 year prior where her T score was reported -1.7.  Continue calcium and vitamin D supplementation.  Repeat bone mineral density at the completion of chemotherapy.   Anemia: Chronic and unchanged.  Patient's hemoglobin is 9.5 today.   Neutropenia: Resolved. Udenyca as above. Thrombocytopenia: Resolved. COVID: Symptoms have resolved, although patient remains COVID-positive on lab work. Recurrent UTI: Patient was given a prescription for 500 mg Levaquin daily prophylactic. Hypotension: Patient received 2 L IV fluids today in clinic.    Patient expressed understanding and was   in agreement with this plan. She also understands that She can call clinic at any time with any questions, concerns, or complaints.     Lloyd Huger, MD   12/10/2021 1:09 PM

## 2021-12-11 ENCOUNTER — Ambulatory Visit: Payer: 59

## 2021-12-11 ENCOUNTER — Inpatient Hospital Stay: Payer: No Typology Code available for payment source

## 2021-12-11 ENCOUNTER — Other Ambulatory Visit: Payer: Self-pay

## 2021-12-11 NOTE — Progress Notes (Signed)
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  INTERVAL HISTORY: Patient returns to clinic today for further evaluation and reconsideration of cycle 5 of treatment which will consist of R-CVP.  She continues to have chronic weakness and fatigue, but states this has improved.  She does not complain of dizziness today.  She has no further fevers.  She denies any night sweats or weight loss.  She has no neurologic complaints.  She denies any chest pain, cough, shortness of breath, or hemoptysis.  She denies any nausea, vomiting, constipation, or diarrhea. She has no urinary complaints.  Patient offers no further specific complaints today.  REVIEW OF SYSTEMS:   Review of Systems  Constitutional:  Positive for malaise/fatigue. Negative for chills, diaphoresis, fever and weight loss.  Respiratory: Negative.  Negative for cough and shortness of breath.   Cardiovascular: Negative.  Negative for chest pain and leg swelling.  Gastrointestinal: Negative.  Negative for abdominal pain.  Genitourinary: Negative.  Negative for dysuria.  Musculoskeletal: Negative.  Negative for back pain.  Skin: Negative.  Negative for rash.  Neurological:  Positive for weakness. Negative for dizziness, sensory change, focal weakness and headaches.  Psychiatric/Behavioral: Negative.  The patient is not nervous/anxious.     As per HPI. Otherwise, a complete review of systems is negative.  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 MammaPrint was reported as low risk, therefore she did not require adjuvant chemotherapy. She completed adjuvant XRT. Will complete 5 year of letrozole in July 2023.   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  Patient's most recent CT scan on April 28, 2018 revealed no evidence of progressive or recurrent disease.    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)   09/17/2021 - 10/30/2021 Chemotherapy   Patient is on Treatment Plan : NON-HODGKINS LYMPHOMA R-CHOP q21d     09/17/2021 -  Chemotherapy   Patient is on Treatment Plan : NON-HODGKINS LYMPHOMA R-CHOP q21d     09/29/2021 Cancer Staging   Staging form: Hodgkin and Non-Hodgkin Lymphoma, AJCC 8th Edition - Clinical stage from 09/29/2021: Stage III (Diffuse large B-cell lymphoma) - Signed by Lloyd Huger, MD on 09/29/2021 Stage prefix: Initial diagnosis     PAST MEDICAL HISTORY: Past Medical History:  Diagnosis Date   Abdominal pain, left lower quadrant    epiploic appendagitis   Anginal pain ([place])    Arthritis 06/06/2018   knee   Asthma    mild   Atrial fibrillation (HCC)    Breast cancer ([place])    Coronary artery disease 06/06/2018   1 artery 100% blocked- cardiologist  Dr. Mamie Nick. at [facility] in [place]   Depression    Diabetes mellitus without complication [facility])    Dyspnea    Dysrhythmia    GERD (gastroesophageal reflux disease)    Hypertension    Hypothyroidism    MALT (mucosa associated lymphoid tissue) ([place]) 07/05/2014   Right neck mass  resected 01/2014.   No pertinent past medical history    Pancytopenia ([place])    Personal history of radiation therapy    Sleep apnea 06/06/2018   O2- 2l at bedtime and BiPap @ bedtime    PAST SURGICAL HISTORY: Past Surgical History:  Procedure Laterality Date   BREAST BIOPSY Left 2/136/2018   INVASIVE MAMMARY CARCINOMA   BREAST BIOPSY Right 05/25/2016   FIBROCYSTIC CHANGE WITH CALCIFICATIONS    BREAST BIOPSY Right 05/29/2020   Affirm bx-"X" clip-FIBROADENOMA WITH ASSOCIATED CALCIFICATIONS. - NEGATIVE   BREAST EXCISIONAL BIOPSY Left 06/04/2016   INVASIVE MAMMARY CARCINOMA.    BREAST LUMPECTOMY Left 06/04/2016   INVASIVE MAMMARY CARCINOMA.    CARDIAC CATHETERIZATION     CARDIAC CATHETERIZATION N/A 09/24/2015   Procedure: Left Heart Cath and Coronary Angiography;  Surgeon: Isaias Cowman, MD;  Location: [facility];  Service: Cardiovascular;  Laterality: N/A;   CARDIAC CATHETERIZATION N/A 09/24/2015   Procedure: Coronary Stent Intervention;  Surgeon: Isaias Cowman, MD;  Location: [place] [facility];  Service: Cardiovascular;  Laterality: N/A;   CHOLECYSTECTOMY  11/26   08/1970   COLONOSCOPY WITH PROPOFOL N/A 07/08/2021   Procedure: COLONOSCOPY WITH PROPOFOL;  Surgeon: Tol[place], Benay Pike, MD;  Location: [facility];  Service: Gastroenterology;  Laterality: N/A;   DILATATION & CURETTAGE/HYSTEROSCOPY WITH MYOSURE N/A 05/05/2015   Procedure: Hysteroscopy and endometrial curretage;  Surgeon: Boykin Nearing, MD;  Location: [facility] OR[facility];  Service: Gynecology;  Laterality: N/A;   DILATION AND CURETTAGE OF UTERUS     EXCISION MASS FROM NECK  Right    Dr. Tami Ribas   EYE SURGERY  05/29/2020   bil cataract 9/08   HAMMER TOE SURGERY Right    IR BONE MARROW BIOPSY & ASPIRATION  09/09/2021   IR IMAGING GUIDED PORT INSERTION  09/09/2021   JOINT REPLACEMENT Bilateral 2008, 11/26/ 2012   Partial Knee Replacements   LEFT HEART CATH AND CORONARY ANGIOGRAPHY N/A 10/25/2017    Procedure: LEFT HEART CATH AND CORONARY ANGIOGRAPHY;  Surgeon: Teodoro Spray, MD;  Location: [facility];  Service: Cardiovascular;  Laterality: N/A;   LEFT HEART CATH AND CORONARY ANGIOGRAPHY N/A 10/26/2017   Procedure: LEFT HEART CATH AND CORONARY ANGIOGRAPHY;  Surgeon: Isaias Cowman, MD;  Location: [place] [facility];  Service: Cardiovascular;  Laterality: N/A;   PARTIAL MASTECTOMY WITH NEEDLE LOCALIZATION Left 06/04/2016   Procedure: PARTIAL MASTECTOMY WITH NEEDLE LOCALIZATION;  Surgeon: Leonie Green, MD;  Location: [facility];  Service: General;  Laterality: Left;   SCLERAL BUCKLE  02/16/2011   Procedure: SCLERAL BUCKLE;  Surgeon: Hayden Pedro, MD;  Location: [facility];  Service: Ophthalmology;  Laterality: Left;  Scleral Buckle Left Eye with Headscope Laser   SENTINEL NODE BIOPSY Left 06/04/2016   Procedure: SENTINEL NODE BIOPSY;  Surgeon: Leonie Green, MD;  Location: [facility];  Service: General;  Laterality: Left;    FAMILY HISTORY Family History  Problem Relation Age of Onset   Breast cancer Mother 97   Heart disease Father    Breast cancer Paternal Aunt 48   Breast cancer Cousin    Anesthesia problems Neg Hx    Hypotension  Neg Hx    Malignant hyperthermia Neg Hx    Pseudochol deficiency Neg Hx     GYNECOLOGIC HISTORY:  No LMP recorded. Patient is postmenopausal.     ADVANCED DIRECTIVES:    HEALTH MAINTENANCE: Social History   Tobacco Use   Smoking status: Never   Smokeless tobacco: Never  Vaping Use   Vaping Use: Never used  Substance Use Topics   Alcohol use: No   Drug use: No     Colonoscopy:  PAP:  Bone density:  Mammogram: November 2016  Allergies  Allergen Reactions   Codeine Other (See Comments)    Stroke like symptoms   Tramadol Itching    Current Outpatient Medications  Medication Sig Dispense Refill   acetaminophen (TYLENOL) 500 MG tablet Take 500 mg by mouth every 4 (four) hours as needed.     amLODipine  (NORVASC) 10 MG tablet Take 1 tablet (10 mg total) by mouth daily. 30 tablet 0   apixaban (ELIQUIS) 5 MG TABS tablet Take 1 tablet (5 mg total) by mouth 2 (two) times daily. HOLD till you get the Blood work done on 12/10/21 and d/w Dr Grayland Ormond to see if you can restart it 60 tablet 1   atorvastatin (LIPITOR) 40 MG tablet Take 1 tablet (40 mg total) by mouth at bedtime.     famotidine (PEPCID) 40 MG tablet Take 40 mg by mouth at bedtime.     Fluticasone-Salmeterol (ADVAIR) 100-50 MCG/DOSE AEPB Inhale 1 puff into the lungs 2 (two) times daily as needed (for asthma.).     isosorbide mononitrate (IMDUR) 30 MG 24 hr tablet Take 30 mg by mouth daily.     levothyroxine (SYNTHROID) 125 MCG tablet Take 125 mcg by mouth daily.     loratadine (CLARITIN) 10 MG tablet Take 10 mg by mouth daily.     magic mouthwash (nystatin, hydrocortisone, diphenhydrAMINE) suspension Swish and spit 5 mLs 3 (three) times daily as needed for mouth pain. 540 mL 0   nitroGLYCERIN (NITROSTAT) 0.4 MG SL tablet Place 0.4 mg under the tongue every 5 (five) minutes as needed for chest pain. Maximum 3 doses, If no relief call md or 911.     potassium chloride (KLOR-CON) 10 MEQ tablet Take 1 tablet (10 mEq total) by mouth 2 (two) times daily.     predniSONE (DELTASONE) 20 MG tablet Take by mouth.     RABEprazole (ACIPHEX) 20 MG tablet Take 20 mg by mouth daily.     sertraline (ZOLOFT) 50 MG tablet Take 50 mg by mouth at bedtime.     sotalol (BETAPACE) 80 MG tablet Take 80 mg by mouth 2 (two) times daily.     Ipratropium-Albuterol (COMBIVENT) 20-100 MCG/ACT AERS respimat Inhale 1 puff into the lungs 4 (four) times daily as needed for wheezing. (Patient not taking: Reported on 12/10/2021) 1 each 1   torsemide (DEMADEX) 20 MG tablet Take 10 mg by mouth as directed. Take on Tuesday and Friday (Patient not taking: Reported on 12/10/2021)     No current facility-administered medications for this visit.   Facility-Administered Medications  Ordered in Other Visits  Medication Dose Route Frequency Provider Last Rate Last Admin   acetaminophen (TYLENOL) 325 MG tablet            diphenhydrAMINE (BENADRYL) 25 mg capsule            heparin lock flush 100 UNIT/ML injection            heparin lock flush 100 UNIT/ML  injection            palonosetron (ALOXI) 0.25 MG/5ML injection             OBJECTIVE: BP 100/76 (BP Location: Right Arm, Patient Position: Sitting)   Pulse 96   Temp 99.4 F (37.4 C) (Tympanic)   Wt 156 lb (70.8 kg)   BMI 28.53 kg/m    Body mass index is 28.53 kg/m.    ECOG FS:1 - Symptomatic but completely ambulatory  General: Well-developed, well-nourished, no acute distress. Eyes: Pink conjunctiva, anicteric sclera. HEENT: Normocephalic, moist mucous membranes. Lungs: No audible wheezing or coughing. Heart: Regular rate and rhythm. Abdomen: Soft, nontender, no obvious distention. Musculoskeletal: No edema, cyanosis, or clubbing. Neuro: Alert, answering all questions appropriately. Cranial nerves grossly intact. Skin: No rashes or petechiae noted. Psych: Normal affect.  LAB RESULTS:  CBC    Component Value Date/Time   WBC 11.6 (H) 12/17/2021 0838   RBC 3.16 (L) 12/17/2021 0838   HGB 9.9 (L) 12/17/2021 0838   HGB 13.3 07/03/2014 1503   HCT 29.7 (L) 12/17/2021 0838   HCT 40.8 07/03/2014 1503   PLT 171 12/17/2021 0838   PLT 240 07/03/2014 1503   MCV 94.0 12/17/2021 0838   MCV 80 07/03/2014 1503   MCH 31.3 12/17/2021 0838   MCHC 33.3 12/17/2021 0838   RDW 23.8 (H) 12/17/2021 0838   RDW 15.3 (H) 07/03/2014 1503   LYMPHSABS 0.5 (L) 12/17/2021 0838   LYMPHSABS 2.8 07/03/2014 1503   MONOABS 1.1 (H) 12/17/2021 0838   MONOABS 0.5 07/03/2014 1503   EOSABS 0.0 12/17/2021 0838   EOSABS 0.3 07/03/2014 1503   BASOSABS 0.1 12/17/2021 0838   BASOSABS 0.1 07/03/2014 1503     STUDIES: No results found.  ASSESSMENT: Stage III diffuse large B-cell lymphoma.  PLAN:    Stage III diffuse large B-cell  lymphoma: PET scan and biopsy results reviewed independently.  Case discussed with pathology confirming this is not a recurrence of patient's MALT lymphoma.  Bone marrow biopsy did not reveal any evidence of lymphoma.  MUGA scan completed on September 15, 2021 revealed an EF of 75%.  Echocardiogram on December 01, 2021 revealed an EF of 60 to 65%. Repeat PET scan results from December 09, 2021 reviewed independently and reported as above with complete metabolic response of patient's lymphoma.  Agree that sinus lesion is unrelated and a referral has been sent back to ENT.  Given patient's persistent weakness and fatigue, have recommended completing the final 2 cycles of chemotherapy, but dropping Adriamycin and using only R-CVP.  Proceed with cycle 5 of R-CVP today.  Return to clinic next week for IV fluids and then in 3 weeks for further evaluation and consideration of her sixth and final treatment.   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 MammaPrint was reported as low risk, therefore she did not require adjuvant chemotherapy. She completed adjuvant XRT.  Patient completed 5 years of letrozole in June 2023.  Her most recent mammogram on May 20, 2021 was reported as BI-RADS 1.  Repeat in March 2024.   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  Case discussed with pathology.  This is a different primary.   Left renal lesion: CT scan results from December 15, 2020 with only mild increase of  benign renal lesion to 13 mm.  No intervention is needed at this time. Bone mineral density: Patient's most recent bone mineral density on November 13, 2020 revealed a T score of -2.0 which is slightly worse than 1 year prior where her T score was reported -1.7.  Continue calcium and vitamin D  supplementation.  Repeat bone mineral density at the completion of chemotherapy.   Anemia: Hemoglobin mildly improved to 9.9. Neutropenia: Patient now has a mild leukocytosis.   Thrombocytopenia: Resolved. COVID: Symptoms have resolved, although patient remains COVID-positive on lab work. Recurrent UTI: Continue 500 mg Levaquin daily prophylactic throughout the remainder of her treatments. Hypotension: Resolved.  Patient has discontinued amlodipine.  IV fluids x2 next week as above.    Patient expressed understanding and was in agreement with this plan. She also understands that She can call clinic at any time with any questions, concerns, or complaints.     Lloyd Huger, MD   12/18/2021 11:49 AM

## 2021-12-16 ENCOUNTER — Encounter: Payer: Self-pay | Admitting: Oncology

## 2021-12-16 MED FILL — Fosaprepitant Dimeglumine For IV Infusion 150 MG (Base Eq): INTRAVENOUS | Qty: 5 | Status: AC

## 2021-12-16 MED FILL — Dexamethasone Sodium Phosphate Inj 100 MG/10ML: INTRAMUSCULAR | Qty: 1 | Status: AC

## 2021-12-17 ENCOUNTER — Encounter: Payer: Self-pay | Admitting: Oncology

## 2021-12-17 ENCOUNTER — Inpatient Hospital Stay: Payer: No Typology Code available for payment source

## 2021-12-17 ENCOUNTER — Inpatient Hospital Stay (HOSPITAL_BASED_OUTPATIENT_CLINIC_OR_DEPARTMENT_OTHER): Payer: No Typology Code available for payment source | Admitting: Oncology

## 2021-12-17 VITALS — BP 100/76 | HR 96 | Temp 99.4°F | Wt 156.0 lb

## 2021-12-17 DIAGNOSIS — C8338 Diffuse large B-cell lymphoma, lymph nodes of multiple sites: Secondary | ICD-10-CM

## 2021-12-17 DIAGNOSIS — E860 Dehydration: Secondary | ICD-10-CM

## 2021-12-17 DIAGNOSIS — D709 Neutropenia, unspecified: Secondary | ICD-10-CM | POA: Diagnosis not present

## 2021-12-17 LAB — COMPREHENSIVE METABOLIC PANEL
ALT: 15 U/L (ref 0–44)
AST: 30 U/L (ref 15–41)
Albumin: 3.3 g/dL — ABNORMAL LOW (ref 3.5–5.0)
Alkaline Phosphatase: 105 U/L (ref 38–126)
Anion gap: 6 (ref 5–15)
BUN: 11 mg/dL (ref 8–23)
CO2: 25 mmol/L (ref 22–32)
Calcium: 9.4 mg/dL (ref 8.9–10.3)
Chloride: 103 mmol/L (ref 98–111)
Creatinine, Ser: 0.81 mg/dL (ref 0.44–1.00)
GFR, Estimated: >60 mL/min (ref >60)
Glucose, Bld: 141 mg/dL — ABNORMAL HIGH (ref 70–99)
Potassium: 3.9 mmol/L (ref 3.5–5.1)
Sodium: 134 mmol/L — ABNORMAL LOW (ref 135–145)
Total Bilirubin: 0.8 mg/dL (ref 0.3–1.2)
Total Protein: 6.7 g/dL (ref 6.5–8.1)

## 2021-12-17 LAB — CBC WITH DIFFERENTIAL/PLATELET
Abs Immature Granulocytes: 0.08 10*3/uL — ABNORMAL HIGH (ref 0.00–0.07)
Basophils Absolute: 0.1 10*3/uL (ref 0.0–0.1)
Basophils Relative: 1 %
Eosinophils Absolute: 0 10*3/uL (ref 0.0–0.5)
Eosinophils Relative: 0 %
HCT: 29.7 % — ABNORMAL LOW (ref 36.0–46.0)
Hemoglobin: 9.9 g/dL — ABNORMAL LOW (ref 12.0–15.0)
Immature Granulocytes: 1 %
Lymphocytes Relative: 4 %
Lymphs Abs: 0.5 10*3/uL — ABNORMAL LOW (ref 0.7–4.0)
MCH: 31.3 pg (ref 26.0–34.0)
MCHC: 33.3 g/dL (ref 30.0–36.0)
MCV: 94 fL (ref 80.0–100.0)
Monocytes Absolute: 1.1 10*3/uL — ABNORMAL HIGH (ref 0.1–1.0)
Monocytes Relative: 9 %
Neutro Abs: 9.8 10*3/uL — ABNORMAL HIGH (ref 1.7–7.7)
Neutrophils Relative %: 85 %
Platelets: 171 10*3/uL (ref 150–400)
RBC: 3.16 MIL/uL — ABNORMAL LOW (ref 3.87–5.11)
RDW: 23.8 % — ABNORMAL HIGH (ref 11.5–15.5)
WBC: 11.6 10*3/uL — ABNORMAL HIGH (ref 4.0–10.5)
nRBC: 0.2 % (ref 0.0–0.2)

## 2021-12-17 MED ORDER — SODIUM CHLORIDE 0.9 % IV SOLN
375.0000 mg/m2 | Freq: Once | INTRAVENOUS | Status: AC
  Administered 2021-12-17: 700 mg via INTRAVENOUS
  Filled 2021-12-17: qty 50

## 2021-12-17 MED ORDER — SODIUM CHLORIDE 0.9 % IV SOLN
150.0000 mg | Freq: Once | INTRAVENOUS | Status: AC
  Administered 2021-12-17: 150 mg via INTRAVENOUS
  Filled 2021-12-17: qty 5
  Filled 2021-12-17: qty 150

## 2021-12-17 MED ORDER — SODIUM CHLORIDE 0.9 % IV SOLN: 375.0000 mg/m2 | Freq: Once | INTRAVENOUS | Status: DC

## 2021-12-17 MED ORDER — SODIUM CHLORIDE 0.9 % IV SOLN
Freq: Once | INTRAVENOUS | Status: AC
  Filled 2021-12-17: qty 250

## 2021-12-17 MED ORDER — DIPHENHYDRAMINE HCL 25 MG PO CAPS
25.0000 mg | ORAL_CAPSULE | Freq: Once | ORAL | Status: AC
  Administered 2021-12-17: 25 mg via ORAL
  Filled 2021-12-17: qty 1

## 2021-12-17 MED ORDER — SODIUM CHLORIDE 0.9 % IV SOLN
10.0000 mg | Freq: Once | INTRAVENOUS | Status: AC
  Administered 2021-12-17: 10 mg via INTRAVENOUS
  Filled 2021-12-17: qty 1
  Filled 2021-12-17: qty 10

## 2021-12-17 MED ORDER — PALONOSETRON HCL INJECTION 0.25 MG/5ML
0.2500 mg | Freq: Once | INTRAVENOUS | Status: AC
  Administered 2021-12-17: 0.25 mg via INTRAVENOUS
  Filled 2021-12-17: qty 5

## 2021-12-17 MED ORDER — ACETAMINOPHEN 325 MG PO TABS
650.0000 mg | ORAL_TABLET | Freq: Once | ORAL | Status: AC
  Administered 2021-12-17: 650 mg via ORAL
  Filled 2021-12-17: qty 2

## 2021-12-17 MED ORDER — VINCRISTINE SULFATE CHEMO INJECTION 1 MG/ML
2.0000 mg | Freq: Once | INTRAVENOUS | Status: AC
  Administered 2021-12-17: 2 mg via INTRAVENOUS
  Filled 2021-12-17: qty 2

## 2021-12-17 MED ORDER — SODIUM CHLORIDE 0.9 % IV SOLN
750.0000 mg/m2 | Freq: Once | INTRAVENOUS | Status: AC
  Administered 2021-12-17: 1340 mg via INTRAVENOUS
  Filled 2021-12-17: qty 67

## 2021-12-17 MED ORDER — HEPARIN SOD (PORK) LOCK FLUSH 100 UNIT/ML IV SOLN
500.0000 [IU] | Freq: Once | INTRAVENOUS | Status: AC | PRN
  Administered 2021-12-17: 500 [IU]
  Filled 2021-12-17: qty 5

## 2021-12-17 NOTE — Patient Instructions (Signed)
[facility]  Discharge Instructions: Thank you for choosing [facility] to provide your oncology and hematology care.  If you have a lab appointment with the Cancer Center, please go directly to the Cancer Center and check in at the registration area.  Wear comfortable clothing and clothing appropriate for easy access to any Portacath or PICC line.   We strive to give you quality time with your provider. You may need to reschedule your appointment if you arrive late (15 or more minutes).  Arriving late affects you and other patients whose appointments are after yours.  Also, if you miss three or more appointments without notifying the office, you may be dismissed from the clinic at the provider's discretion.      For prescription refill requests, have your pharmacy contact our office and allow 72 hours for refills to be completed.       To help prevent nausea and vomiting after your treatment, we encourage you to take your nausea medication as directed.  BELOW ARE SYMPTOMS THAT SHOULD BE REPORTED IMMEDIATELY: *FEVER GREATER THAN 100.4 F (38 C) OR HIGHER *CHILLS OR SWEATING *NAUSEA AND VOMITING THAT IS NOT CONTROLLED WITH YOUR NAUSEA MEDICATION *UNUSUAL SHORTNESS OF BREATH *UNUSUAL BRUISING OR BLEEDING *URINARY PROBLEMS (pain or burning when urinating, or frequent urination) *BOWEL PROBLEMS (unusual diarrhea, constipation, pain near the anus) TENDERNESS IN MOUTH AND THROAT WITH OR WITHOUT PRESENCE OF ULCERS (sore throat, sores in mouth, or a toothache) UNUSUAL RASH, SWELLING OR PAIN  UNUSUAL VAGINAL DISCHARGE OR ITCHING   Items with * indicate a potential emergency and should be followed up as soon as possible or go to the Emergency Department if any problems should occur.  Please show the CHEMOTHERAPY ALERT CARD or IMMUNOTHERAPY ALERT CARD at check-in to the Emergency Department and triage nurse.  Should you have questions after your  visit or need to cancel or reschedule your appointment, please contact [facility]  [phone] and follow the prompts.  Office hours are 8:00 a.m. to 4:30 p.m. Monday - Friday. Please note that voicemails left after 4:00 p.m. may not be returned until the following business day.  We are closed weekends and major holidays. You have access to a nurse at all times for urgent questions. Please call the main number to the clinic [phone] and follow the prompts.  For any non-urgent questions, you may also contact your provider using MyChart. We now offer e-Visits for anyone 18 and older to request care online for non-urgent symptoms. For details visit [email or web address].   Also download the MyChart app! Go to the app store, search "MyChart", open the app, select [facility], and log in with your MyChart username and password.  Masks are optional in the cancer centers. If you would like for your care team to wear a mask while they are taking care of you, please let them know. For doctor visits, patients may have with them one support person who is at least 78 years old. At this time, visitors are not allowed in the infusion area.   

## 2021-12-18 ENCOUNTER — Encounter: Payer: Self-pay | Admitting: Oncology

## 2021-12-18 ENCOUNTER — Inpatient Hospital Stay: Payer: No Typology Code available for payment source

## 2021-12-18 DIAGNOSIS — D709 Neutropenia, unspecified: Secondary | ICD-10-CM | POA: Diagnosis not present

## 2021-12-18 DIAGNOSIS — C8338 Diffuse large B-cell lymphoma, lymph nodes of multiple sites: Secondary | ICD-10-CM

## 2021-12-18 MED ORDER — PEGFILGRASTIM INJECTION 6 MG/0.6ML ~~LOC~~
6.0000 mg | PREFILLED_SYRINGE | Freq: Once | SUBCUTANEOUS | Status: AC
  Administered 2021-12-18: 6 mg via SUBCUTANEOUS
  Filled 2021-12-18: qty 0.6

## 2021-12-20 ENCOUNTER — Inpatient Hospital Stay: Payer: No Typology Code available for payment source

## 2021-12-20 ENCOUNTER — Other Ambulatory Visit: Payer: Self-pay

## 2021-12-20 ENCOUNTER — Emergency Department: Payer: No Typology Code available for payment source

## 2021-12-20 ENCOUNTER — Encounter: Payer: Self-pay | Admitting: Oncology

## 2021-12-20 ENCOUNTER — Inpatient Hospital Stay
Admission: EM | Admit: 2021-12-20 | Discharge: 2021-12-29 | DRG: 872 | Disposition: A | Discharge status: Skilled Nursing Facility | Payer: No Typology Code available for payment source | Attending: Internal Medicine | Admitting: Internal Medicine

## 2021-12-20 DIAGNOSIS — I10 Essential (primary) hypertension: Secondary | ICD-10-CM | POA: Diagnosis present

## 2021-12-20 DIAGNOSIS — Z7951 Long term (current) use of inhaled steroids: Secondary | ICD-10-CM

## 2021-12-20 DIAGNOSIS — D6959 Other secondary thrombocytopenia: Secondary | ICD-10-CM | POA: Diagnosis present

## 2021-12-20 DIAGNOSIS — E7800 Pure hypercholesterolemia, unspecified: Secondary | ICD-10-CM | POA: Diagnosis present

## 2021-12-20 DIAGNOSIS — E119 Type 2 diabetes mellitus without complications: Secondary | ICD-10-CM | POA: Diagnosis present

## 2021-12-20 DIAGNOSIS — Z87440 Personal history of urinary (tract) infections: Secondary | ICD-10-CM

## 2021-12-20 DIAGNOSIS — R7401 Elevation of levels of liver transaminase levels: Secondary | ICD-10-CM | POA: Diagnosis present

## 2021-12-20 DIAGNOSIS — Z96653 Presence of artificial knee joint, bilateral: Secondary | ICD-10-CM | POA: Diagnosis present

## 2021-12-20 DIAGNOSIS — Z1152 Encounter for screening for COVID-19: Secondary | ICD-10-CM

## 2021-12-20 DIAGNOSIS — D696 Thrombocytopenia, unspecified: Secondary | ICD-10-CM | POA: Diagnosis not present

## 2021-12-20 DIAGNOSIS — N3090 Cystitis, unspecified without hematuria: Secondary | ICD-10-CM | POA: Diagnosis present

## 2021-12-20 DIAGNOSIS — R7989 Other specified abnormal findings of blood chemistry: Secondary | ICD-10-CM | POA: Diagnosis present

## 2021-12-20 DIAGNOSIS — I700 Atherosclerosis of aorta: Secondary | ICD-10-CM | POA: Diagnosis present

## 2021-12-20 DIAGNOSIS — E876 Hypokalemia: Secondary | ICD-10-CM | POA: Diagnosis present

## 2021-12-20 DIAGNOSIS — A419 Sepsis, unspecified organism: Secondary | ICD-10-CM | POA: Diagnosis present

## 2021-12-20 DIAGNOSIS — R6520 Severe sepsis without septic shock: Secondary | ICD-10-CM | POA: Diagnosis present

## 2021-12-20 DIAGNOSIS — F32A Depression, unspecified: Secondary | ICD-10-CM

## 2021-12-20 DIAGNOSIS — I2510 Atherosclerotic heart disease of native coronary artery without angina pectoris: Secondary | ICD-10-CM | POA: Diagnosis present

## 2021-12-20 DIAGNOSIS — G4733 Obstructive sleep apnea (adult) (pediatric): Secondary | ICD-10-CM | POA: Diagnosis present

## 2021-12-20 DIAGNOSIS — Z79899 Other long term (current) drug therapy: Secondary | ICD-10-CM

## 2021-12-20 DIAGNOSIS — Z7901 Long term (current) use of anticoagulants: Secondary | ICD-10-CM

## 2021-12-20 DIAGNOSIS — D6481 Anemia due to antineoplastic chemotherapy: Secondary | ICD-10-CM | POA: Diagnosis present

## 2021-12-20 DIAGNOSIS — Z853 Personal history of malignant neoplasm of breast: Secondary | ICD-10-CM

## 2021-12-20 DIAGNOSIS — Z8249 Family history of ischemic heart disease and other diseases of the circulatory system: Secondary | ICD-10-CM

## 2021-12-20 DIAGNOSIS — I480 Paroxysmal atrial fibrillation: Secondary | ICD-10-CM | POA: Diagnosis present

## 2021-12-20 DIAGNOSIS — I2489 Other forms of acute ischemic heart disease: Secondary | ICD-10-CM | POA: Diagnosis present

## 2021-12-20 DIAGNOSIS — E872 Acidosis, unspecified: Secondary | ICD-10-CM | POA: Diagnosis present

## 2021-12-20 DIAGNOSIS — I4891 Unspecified atrial fibrillation: Secondary | ICD-10-CM | POA: Diagnosis not present

## 2021-12-20 DIAGNOSIS — Z923 Personal history of irradiation: Secondary | ICD-10-CM

## 2021-12-20 DIAGNOSIS — Z7952 Long term (current) use of systemic steroids: Secondary | ICD-10-CM

## 2021-12-20 DIAGNOSIS — B9620 Unspecified Escherichia coli [E. coli] as the cause of diseases classified elsewhere: Secondary | ICD-10-CM | POA: Diagnosis present

## 2021-12-20 DIAGNOSIS — Z9049 Acquired absence of other specified parts of digestive tract: Secondary | ICD-10-CM

## 2021-12-20 DIAGNOSIS — C8330 Diffuse large B-cell lymphoma, unspecified site: Secondary | ICD-10-CM | POA: Diagnosis present

## 2021-12-20 DIAGNOSIS — E039 Hypothyroidism, unspecified: Secondary | ICD-10-CM | POA: Diagnosis present

## 2021-12-20 DIAGNOSIS — N390 Urinary tract infection, site not specified: Secondary | ICD-10-CM | POA: Diagnosis not present

## 2021-12-20 DIAGNOSIS — Z79890 Hormone replacement therapy (postmenopausal): Secondary | ICD-10-CM

## 2021-12-20 DIAGNOSIS — K219 Gastro-esophageal reflux disease without esophagitis: Secondary | ICD-10-CM | POA: Diagnosis present

## 2021-12-20 DIAGNOSIS — D72829 Elevated white blood cell count, unspecified: Secondary | ICD-10-CM | POA: Insufficient documentation

## 2021-12-20 DIAGNOSIS — D84821 Immunodeficiency due to drugs: Secondary | ICD-10-CM | POA: Diagnosis present

## 2021-12-20 DIAGNOSIS — Z955 Presence of coronary angioplasty implant and graft: Secondary | ICD-10-CM

## 2021-12-20 HISTORY — DX: Depression, unspecified: F32.A

## 2021-12-20 LAB — COMPREHENSIVE METABOLIC PANEL
ALT: 67 U/L — ABNORMAL HIGH (ref 0–44)
AST: 111 U/L — ABNORMAL HIGH (ref 15–41)
Albumin: 2.4 g/dL — ABNORMAL LOW (ref 3.5–5.0)
Alkaline Phosphatase: 165 U/L — ABNORMAL HIGH (ref 38–126)
Anion gap: 4 — ABNORMAL LOW (ref 5–15)
BUN: 21 mg/dL (ref 8–23)
CO2: 22 mmol/L (ref 22–32)
Calcium: 8.5 mg/dL — ABNORMAL LOW (ref 8.9–10.3)
Chloride: 112 mmol/L — ABNORMAL HIGH (ref 98–111)
Creatinine, Ser: 0.82 mg/dL (ref 0.44–1.00)
GFR, Estimated: >60 mL/min (ref >60)
Glucose, Bld: 98 mg/dL (ref 70–99)
Potassium: 3.3 mmol/L — ABNORMAL LOW (ref 3.5–5.1)
Sodium: 138 mmol/L (ref 135–145)
Total Bilirubin: 1.1 mg/dL (ref 0.3–1.2)
Total Protein: 5 g/dL — ABNORMAL LOW (ref 6.5–8.1)

## 2021-12-20 LAB — RESP PANEL BY RT-PCR (FLU A&B, COVID) ARPGX2
Influenza A by PCR: NEGATIVE
Influenza B by PCR: NEGATIVE
SARS Coronavirus 2 by RT PCR: NEGATIVE

## 2021-12-20 LAB — CBC WITH DIFFERENTIAL/PLATELET
Abs Immature Granulocytes: 1.05 10*3/uL — ABNORMAL HIGH (ref 0.00–0.07)
Basophils Absolute: 0.1 10*3/uL (ref 0.0–0.1)
Basophils Relative: 0 %
Eosinophils Absolute: 0 10*3/uL (ref 0.0–0.5)
Eosinophils Relative: 0 %
HCT: 26.2 % — ABNORMAL LOW (ref 36.0–46.0)
Hemoglobin: 8.3 g/dL — ABNORMAL LOW (ref 12.0–15.0)
Immature Granulocytes: 6 %
Lymphocytes Relative: 0 %
Lymphs Abs: 0 10*3/uL — ABNORMAL LOW (ref 0.7–4.0)
MCH: 30.9 pg (ref 26.0–34.0)
MCHC: 31.7 g/dL (ref 30.0–36.0)
MCV: 97.4 fL (ref 80.0–100.0)
Monocytes Absolute: 0 10*3/uL — ABNORMAL LOW (ref 0.1–1.0)
Monocytes Relative: 0 %
Neutro Abs: 15.4 10*3/uL — ABNORMAL HIGH (ref 1.7–7.7)
Neutrophils Relative %: 94 %
Platelets: 60 10*3/uL — ABNORMAL LOW (ref 150–400)
RBC: 2.69 MIL/uL — ABNORMAL LOW (ref 3.87–5.11)
RDW: 22.9 % — ABNORMAL HIGH (ref 11.5–15.5)
Smear Review: DECREASED
WBC: 16.6 10*3/uL — ABNORMAL HIGH (ref 4.0–10.5)
nRBC: 0 % (ref 0.0–0.2)

## 2021-12-20 LAB — TROPONIN I (HIGH SENSITIVITY)
Troponin I (High Sensitivity): 135 ng/L (ref <18)
Troponin I (High Sensitivity): 237 ng/L (ref <18)
Troponin I (High Sensitivity): 518 ng/L (ref <18)
Troponin I (High Sensitivity): 447 ng/L (ref <18)

## 2021-12-20 LAB — URINALYSIS, ROUTINE W REFLEX MICROSCOPIC
Bilirubin Urine: NEGATIVE
Glucose, UA: NEGATIVE mg/dL
Ketones, ur: NEGATIVE mg/dL
Nitrite: NEGATIVE
Protein, ur: 100 mg/dL — AB
Specific Gravity, Urine: >1.046 — ABNORMAL HIGH (ref 1.005–1.030)
WBC, UA: >50 WBC/hpf — ABNORMAL HIGH (ref 0–5)
pH: 6 (ref 5.0–8.0)

## 2021-12-20 LAB — APTT: aPTT: 26 seconds (ref 24–36)

## 2021-12-20 LAB — PROTIME-INR
INR: 1.3 — ABNORMAL HIGH (ref 0.8–1.2)
Prothrombin Time: 16.1 seconds — ABNORMAL HIGH (ref 11.4–15.2)

## 2021-12-20 LAB — HEPARIN LEVEL (UNFRACTIONATED): Heparin Unfractionated: >1.1 IU/mL — ABNORMAL HIGH (ref 0.30–0.70)

## 2021-12-20 LAB — MAGNESIUM: Magnesium: 1.8 mg/dL (ref 1.7–2.4)

## 2021-12-20 LAB — LACTIC ACID, PLASMA
Lactic Acid, Venous: 2.6 mmol/L (ref 0.5–1.9)
Lactic Acid, Venous: 1.8 mmol/L (ref 0.5–1.9)

## 2021-12-20 LAB — TSH: TSH: 0.771 u[IU]/mL (ref 0.350–4.500)

## 2021-12-20 LAB — T4, FREE: Free T4: 1.54 ng/dL — ABNORMAL HIGH (ref 0.61–1.12)

## 2021-12-20 LAB — BRAIN NATRIURETIC PEPTIDE: B Natriuretic Peptide: 286.6 pg/mL — ABNORMAL HIGH (ref 0.0–100.0)

## 2021-12-20 MED ORDER — SODIUM CHLORIDE 0.9 % IV SOLN
2.0000 g | Freq: Once | INTRAVENOUS | Status: AC
  Administered 2021-12-20: 2 g via INTRAVENOUS
  Filled 2021-12-20: qty 12.5

## 2021-12-20 MED ORDER — HEPARIN SODIUM (PORCINE) 5000 UNIT/ML IJ SOLN: 4000.0000 [IU] | Freq: Once | INTRAMUSCULAR | Status: DC

## 2021-12-20 MED ORDER — POTASSIUM CHLORIDE 20 MEQ PO PACK: 40.0000 meq | PACK | Freq: Two times a day (BID) | ORAL | Status: DC

## 2021-12-20 MED ORDER — MAGNESIUM SULFATE 2 GM/50ML IV SOLN
2.0000 g | Freq: Once | INTRAVENOUS | Status: AC
  Administered 2021-12-20: 2 g via INTRAVENOUS
  Filled 2021-12-20: qty 50

## 2021-12-20 MED ORDER — AMIODARONE HCL IN DEXTROSE 360-4.14 MG/200ML-% IV SOLN
60.0000 mg/h | INTRAVENOUS | Status: AC
  Administered 2021-12-20 (×2): 60 mg/h via INTRAVENOUS
  Filled 2021-12-20 (×2): qty 200

## 2021-12-20 MED ORDER — NITROGLYCERIN 0.4 MG SL SUBL: 0.4000 mg | SUBLINGUAL_TABLET | SUBLINGUAL | Status: DC | PRN

## 2021-12-20 MED ORDER — PANTOPRAZOLE SODIUM 40 MG PO TBEC
40.0000 mg | DELAYED_RELEASE_TABLET | Freq: Every day | ORAL | Status: DC
  Administered 2021-12-20 – 2021-12-29 (×10): 40 mg via ORAL
  Filled 2021-12-20 (×10): qty 1

## 2021-12-20 MED ORDER — SERTRALINE HCL 50 MG PO TABS
50.0000 mg | ORAL_TABLET | Freq: Every day | ORAL | Status: DC
  Administered 2021-12-20 – 2021-12-28 (×9): 50 mg via ORAL
  Filled 2021-12-20 (×9): qty 1

## 2021-12-20 MED ORDER — PREDNISONE 50 MG PO TABS
100.0000 mg | ORAL_TABLET | Freq: Every day | ORAL | Status: DC
  Administered 2021-12-20 – 2021-12-21 (×2): 100 mg via ORAL
  Filled 2021-12-20 (×2): qty 5

## 2021-12-20 MED ORDER — HEPARIN (PORCINE) 25000 UT/250ML-% IV SOLN
850.0000 [IU]/h | INTRAVENOUS | Status: DC
  Administered 2021-12-20: 750 [IU]/h via INTRAVENOUS
  Filled 2021-12-20: qty 250

## 2021-12-20 MED ORDER — FERROUS SULFATE 325 (65 FE) MG PO TABS
325.0000 mg | ORAL_TABLET | Freq: Every day | ORAL | Status: DC
  Administered 2021-12-20 – 2021-12-29 (×9): 325 mg via ORAL
  Filled 2021-12-20 (×10): qty 1

## 2021-12-20 MED ORDER — LEVOTHYROXINE SODIUM 25 MCG PO TABS
125.0000 ug | ORAL_TABLET | Freq: Every day | ORAL | Status: DC
  Administered 2021-12-21 – 2021-12-29 (×9): 125 ug via ORAL
  Filled 2021-12-20 (×9): qty 1

## 2021-12-20 MED ORDER — ONDANSETRON HCL 4 MG PO TABS: 4.0000 mg | ORAL_TABLET | Freq: Four times a day (QID) | ORAL | Status: DC | PRN

## 2021-12-20 MED ORDER — SODIUM CHLORIDE 0.9 % IV BOLUS
1000.0000 mL | Freq: Once | INTRAVENOUS | Status: AC
  Administered 2021-12-20: 1000 mL via INTRAVENOUS

## 2021-12-20 MED ORDER — ACETAMINOPHEN 500 MG PO TABS
500.0000 mg | ORAL_TABLET | ORAL | Status: DC | PRN
  Administered 2021-12-22 (×2): 500 mg via ORAL
  Filled 2021-12-20 (×2): qty 1

## 2021-12-20 MED ORDER — POTASSIUM CHLORIDE 20 MEQ PO PACK
40.0000 meq | PACK | Freq: Two times a day (BID) | ORAL | Status: AC
  Administered 2021-12-20 – 2021-12-21 (×2): 40 meq via ORAL
  Filled 2021-12-20 (×2): qty 2

## 2021-12-20 MED ORDER — POTASSIUM CHLORIDE ER 10 MEQ PO TBCR: 10.0000 meq | EXTENDED_RELEASE_TABLET | Freq: Two times a day (BID) | ORAL | Status: DC

## 2021-12-20 MED ORDER — AMIODARONE LOAD VIA INFUSION
150.0000 mg | Freq: Once | INTRAVENOUS | Status: AC
  Administered 2021-12-20: 150 mg via INTRAVENOUS
  Filled 2021-12-20: qty 83.34

## 2021-12-20 MED ORDER — DILTIAZEM HCL 25 MG/5ML IV SOLN
15.0000 mg | Freq: Once | INTRAVENOUS | Status: AC
  Administered 2021-12-20: 15 mg via INTRAVENOUS

## 2021-12-20 MED ORDER — APIXABAN 5 MG PO TABS: 5.0000 mg | ORAL_TABLET | Freq: Two times a day (BID) | ORAL | Status: DC

## 2021-12-20 MED ORDER — ATORVASTATIN CALCIUM 20 MG PO TABS
40.0000 mg | ORAL_TABLET | Freq: Every day | ORAL | Status: DC
  Administered 2021-12-20 – 2021-12-28 (×9): 40 mg via ORAL
  Filled 2021-12-20 (×9): qty 2

## 2021-12-20 MED ORDER — ONDANSETRON HCL 4 MG/2ML IJ SOLN
4.0000 mg | Freq: Four times a day (QID) | INTRAMUSCULAR | Status: DC | PRN
  Administered 2021-12-22 (×3): 4 mg via INTRAVENOUS
  Filled 2021-12-20 (×3): qty 2

## 2021-12-20 MED ORDER — VANCOMYCIN HCL 1500 MG/300ML IV SOLN
1500.0000 mg | Freq: Once | INTRAVENOUS | Status: AC
  Administered 2021-12-20: 1500 mg via INTRAVENOUS
  Filled 2021-12-20: qty 300

## 2021-12-20 MED ORDER — IOHEXOL 300 MG/ML  SOLN
100.0000 mL | Freq: Once | INTRAMUSCULAR | Status: AC | PRN
  Administered 2021-12-20: 100 mL via INTRAVENOUS

## 2021-12-20 MED ORDER — MOMETASONE FURO-FORMOTEROL FUM 100-5 MCG/ACT IN AERO
2.0000 | INHALATION_SPRAY | Freq: Two times a day (BID) | RESPIRATORY_TRACT | Status: DC
  Filled 2021-12-20: qty 8.8

## 2021-12-20 MED ORDER — SOTALOL HCL 80 MG PO TABS
80.0000 mg | ORAL_TABLET | Freq: Two times a day (BID) | ORAL | Status: DC
  Filled 2021-12-20: qty 1

## 2021-12-20 MED ORDER — SODIUM CHLORIDE 0.9 % IV SOLN
1.0000 g | Freq: Three times a day (TID) | INTRAVENOUS | Status: DC
  Administered 2021-12-20 – 2021-12-22 (×6): 1 g via INTRAVENOUS
  Filled 2021-12-20 (×4): qty 20
  Filled 2021-12-20: qty 1
  Filled 2021-12-20: qty 20
  Filled 2021-12-20: qty 1

## 2021-12-20 MED ORDER — VITAMIN B-12 1000 MCG PO TABS
1000.0000 ug | ORAL_TABLET | Freq: Every day | ORAL | Status: DC
  Administered 2021-12-21 – 2021-12-29 (×8): 1000 ug via ORAL
  Filled 2021-12-20 (×9): qty 1

## 2021-12-20 MED ORDER — ACETAMINOPHEN 500 MG PO TABS
1000.0000 mg | ORAL_TABLET | Freq: Once | ORAL | Status: AC
  Administered 2021-12-20: 1000 mg via ORAL
  Filled 2021-12-20: qty 2

## 2021-12-20 MED ORDER — LACTATED RINGERS IV SOLN: INTRAVENOUS | Status: DC

## 2021-12-20 MED ORDER — SODIUM CHLORIDE 0.9 % IV BOLUS: 500.0000 mL | Freq: Once | INTRAVENOUS | Status: DC

## 2021-12-20 MED ORDER — DILTIAZEM HCL 25 MG/5ML IV SOLN
10.0000 mg | Freq: Once | INTRAVENOUS | Status: AC
  Administered 2021-12-20: 10 mg via INTRAVENOUS
  Filled 2021-12-20: qty 5

## 2021-12-20 MED ORDER — AMIODARONE HCL IN DEXTROSE 360-4.14 MG/200ML-% IV SOLN
30.0000 mg/h | INTRAVENOUS | Status: DC
  Administered 2021-12-20 – 2021-12-21 (×2): 30 mg/h via INTRAVENOUS
  Filled 2021-12-20: qty 200

## 2021-12-20 MED ORDER — HEPARIN (PORCINE) 25000 UT/250ML-% IV SOLN: 750.0000 [IU]/h | INTRAVENOUS | Status: DC

## 2021-12-20 MED ORDER — HEPARIN BOLUS VIA INFUSION
1800.0000 [IU] | Freq: Once | INTRAVENOUS | Status: DC
  Filled 2021-12-20: qty 1800

## 2021-12-20 MED ORDER — LORATADINE 10 MG PO TABS
10.0000 mg | ORAL_TABLET | Freq: Every day | ORAL | Status: DC
  Administered 2021-12-20 – 2021-12-29 (×8): 10 mg via ORAL
  Filled 2021-12-20 (×10): qty 1

## 2021-12-20 NOTE — Assessment & Plan Note (Addendum)
Patient with a known history of atrial fibrillation on chronic anticoagulation therapy who presents to the ER for evaluation of pain in both lower jaws with radiation to the left shoulder and left anterior chest wall associated with palpitations and found to be in rapid A-fib. She received 2 doses of IV Cardizem without any improvement and is currently on amiodarone drip per cardiology recommendation. Eliquis is currently on hold due thrombocytopenia per oncology recommendation.  We will place patient on IV heparin Hold sotalol Cardiology consult

## 2021-12-20 NOTE — Assessment & Plan Note (Signed)
Patient noted to have transaminitis which may be related to shock Obtain right upper quadrant ultrasound Monitor liver enzymes

## 2021-12-20 NOTE — Assessment & Plan Note (Signed)
Continue Zoloft 

## 2021-12-20 NOTE — Assessment & Plan Note (Addendum)
Patient noted to have an uptrending troponin level which may be related to non-ST elevation MI versus demand ischemia from rapid A-fib Patient with a known history of coronary artery disease Status post left heart cath in 2019 which showed one-vessel coronary artery disease with occluded proximal RCA with ipsi- and contra- collaterals. Patent stent in first diagonal branch. Normal left ventricular function Place patient on heparin drip Patient had a recent 2D echocardiogram which was done on 12/01/21 showed a normal LVEF. Will defer repeating echocardiogram to cardiology to rule out regional wall motion abnormality Consult cardiology

## 2021-12-20 NOTE — H&P (Addendum)
History and Physical    Patient: Stacey Stone MOQ:[id number] DOB: 04/16/1945 DOA: 12/20/2021 DOS: the patient was seen and examined on 12/20/2021 PCP: Rusty Aus, MD  Patient coming from: Home  Chief Complaint:  Chief Complaint  Patient presents with   Tachycardia   HPI: Stacey Stone is a 76 y.o. female with medical history significant for atrial fibrillation on chronic anticoagulation therapy with Eliquis, history of breast cancer status post radiation therapy and recently completed a 5-year course of letrozole, coronary artery disease, stage III diffuse large B-cell lymphoma currently on chemotherapy with last treatment on 12/17/21, history of recurrent UTI on daily prophylactic therapy with Levaquin, history of hypotension who presents to the ER by EMS for evaluation of bilateral jaw pain with radiation to her left shoulder and left anterior chest wall which started about 5 AM on the day of admission.  Per patient she usually feels this way when her heart rate goes up.  She called EMS and was noted to be tachycardic with heart rates in the 200's. She complains of shortness of breath and has a cough productive of clear phlegm and occasional yellow/green phlegm, as well as weakness and fatigue.  She also complains of dizziness and lightheadedness but has not had any falls as well as constipation. She denies having any fever, no chills, no diarrhea, no nausea, no vomiting, no frequency of urination, no dysuria, no headache, leg swelling, no blurred vision, no focal deficits. Labs show white count of 16.6, hemoglobin of 8.3 and platelet count of 60, lactic acid 2.6 >> 1.8, potassium of 3.3 and uptrending troponin levels 135 >> 237. She received 2 L IV fluid bolus in the ER and 2 doses of IV Cardizem without any improvement in her heart rate and is currently on an amiodarone drip. She also received a dose of IV Rocephin. She  will be admitted to the hospital for further  evaluation    Review of Systems: As mentioned in the history of present illness. All other systems reviewed and are negative. Past Medical History:  Diagnosis Date   Abdominal pain, left lower quadrant    epiploic appendagitis   Anginal pain ([place])    Arthritis 06/06/2018   knee   Asthma    mild   Atrial fibrillation (HCC)    Breast cancer ([place])    Coronary artery disease 06/06/2018   1 artery 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 Mamie Nick. 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   No pertinent past medical history    Pancytopenia ([place])    Personal history of radiation therapy    Sleep apnea 06/06/2018   O2- 2l at bedtime and BiPap @ bedtime   Past Surgical History:  Procedure Laterality Date   BREAST BIOPSY Left 2/136/2018   INVASIVE MAMMARY CARCINOMA   BREAST BIOPSY Right 05/25/2016   FIBROCYSTIC CHANGE WITH CALCIFICATIONS    BREAST BIOPSY Right 05/29/2020   Affirm bx-"X" clip-FIBROADENOMA WITH ASSOCIATED CALCIFICATIONS. - NEGATIVE   BREAST EXCISIONAL BIOPSY Left 06/04/2016   INVASIVE MAMMARY CARCINOMA.    BREAST LUMPECTOMY Left 06/04/2016   INVASIVE MAMMARY CARCINOMA.    CARDIAC CATHETERIZATION     CARDIAC CATHETERIZATION N/A 09/24/2015  Procedure: Left Heart Cath and Coronary Angiography;  Surgeon: Isaias Cowman, MD;  Location: [facility];  Service: Cardiovascular;  Laterality: N/A;   CARDIAC CATHETERIZATION N/A 09/24/2015   Procedure: Coronary Stent Intervention;  Surgeon: Isaias Cowman, MD;  Location: [facility];  Service: Cardiovascular;  Laterality: N/A;   CHOLECYSTECTOMY  11/26   08/1970   COLONOSCOPY WITH PROPOFOL N/A 07/08/2021   Procedure: COLONOSCOPY WITH  PROPOFOL;  Surgeon: Toledo, Benay Pike, MD;  Location: [facility];  Service: Gastroenterology;  Laterality: N/A;   DILATATION & CURETTAGE/HYSTEROSCOPY WITH MYOSURE N/A 05/05/2015   Procedure: Hysteroscopy and endometrial curretage;  Surgeon: Boykin Nearing, MD;  Location: [facility];  Service: Gynecology;  Laterality: N/A;   DILATION AND CURETTAGE OF UTERUS     EXCISION MASS FROM NECK  Right    Dr. Tami Ribas   EYE SURGERY  05/29/2020   bil cataract 9/08   HAMMER TOE SURGERY Right    IR BONE MARROW BIOPSY & ASPIRATION  09/09/2021   IR IMAGING GUIDED PORT INSERTION  09/09/2021   JOINT REPLACEMENT Bilateral 2008, 11/26/ 2012   Partial Knee Replacements   LEFT HEART CATH AND CORONARY ANGIOGRAPHY N/A 10/25/2017   Procedure: LEFT HEART CATH AND CORONARY ANGIOGRAPHY;  Surgeon: Teodoro Spray, MD;  Location: [facility];  Service: Cardiovascular;  Laterality: N/A;   LEFT HEART CATH AND CORONARY ANGIOGRAPHY N/A 10/26/2017   Procedure: LEFT HEART CATH AND CORONARY ANGIOGRAPHY;  Surgeon: Isaias Cowman, MD;  Location: [facility];  Service: Cardiovascular;  Laterality: N/A;   PARTIAL MASTECTOMY WITH NEEDLE LOCALIZATION Left 06/04/2016   Procedure: PARTIAL MASTECTOMY WITH NEEDLE LOCALIZATION;  Surgeon: Leonie Green, MD;  Location: [facility];  Service: General;  Laterality: Left;   SCLERAL BUCKLE  02/16/2011   Procedure: SCLERAL BUCKLE;  Surgeon: Hayden Pedro, MD;  Location: [facility];  Service: Ophthalmology;  Laterality: Left;  Scleral Buckle Left Eye with Headscope Laser   SENTINEL NODE BIOPSY Left 06/04/2016   Procedure: SENTINEL NODE BIOPSY;  Surgeon: Leonie Green, MD;  Location: [facility];  Service: General;  Laterality: Left;   Social History:  reports that she has never smoked. She has never used smokeless tobacco. She reports that she does not drink alcohol and does not use drugs.  Allergies  Allergen Reactions   Codeine Other (See Comments)    Stroke  like symptoms   Tramadol Itching    Family History  Problem Relation Age of Onset   Breast cancer Mother 36   Heart disease Father    Breast cancer Paternal Aunt 39   Breast cancer Cousin    Anesthesia problems Neg Hx    Hypotension Neg Hx    Malignant hyperthermia Neg Hx    Pseudochol deficiency Neg Hx     Prior to Admission medications   Medication Sig Start Date End Date Taking? Authorizing Provider  acetaminophen (TYLENOL) 500 MG tablet Take 500 mg by mouth every 4 (four) hours as needed.   Yes [provider]  amLODipine (NORVASC) 5 MG tablet Take 10 mg by mouth daily.   Yes [provider]  apixaban (ELIQUIS) 5 MG TABS tablet Take 1 tablet (5 mg total) by mouth 2 (two) times daily. HOLD till you get the Blood work done on 12/10/21 and d/w Dr Grayland Ormond to see if you can restart it 12/11/21  Yes Fritzi Mandes, MD  atorvastatin (LIPITOR) 40 MG tablet Take 1 tablet (40 mg total) by mouth at  bedtime. 05/29/19  Yes Enzo Bi, MD  Calcium Carbonate (CALCIUM 500 PO) Take by mouth.   Yes [provider]  cyanocobalamin (VITAMIN B12) 1000 MCG tablet Take 1,000 mcg by mouth daily.   Yes [provider]  ferrous sulfate 325 (65 FE) MG tablet Take 325 mg by mouth daily with breakfast.   Yes [provider]  Fluticasone-Salmeterol (ADVAIR) 100-50 MCG/DOSE AEPB Inhale 1 puff into the lungs 2 (two) times daily as needed (for asthma.).   Yes [provider]  isosorbide mononitrate (IMDUR) 30 MG 24 hr tablet Take 30 mg by mouth daily.   Yes [provider]  levothyroxine (SYNTHROID) 125 MCG tablet Take 125 mcg by mouth daily.   Yes [provider]  loratadine (CLARITIN) 10 MG tablet Take 10 mg by mouth daily.   Yes [provider]  ondansetron (ZOFRAN) 8 MG tablet Take by mouth every 8 (eight) hours as needed for nausea or vomiting.   Yes [provider]  potassium chloride (KLOR-CON) 10 MEQ tablet Take 1 tablet (10  mEq total) by mouth 2 (two) times daily. 05/29/19  Yes Enzo Bi, MD  predniSONE (DELTASONE) 20 MG tablet Take 100 mg by mouth daily. 12/09/21  Yes [provider]  prochlorperazine (COMPAZINE) 10 MG tablet Take 10 mg by mouth every 6 (six) hours as needed for nausea or vomiting.   Yes [provider]  RABEprazole (ACIPHEX) 20 MG tablet Take 20 mg by mouth daily.   Yes [provider]  sertraline (ZOLOFT) 50 MG tablet Take 50 mg by mouth at bedtime.   Yes [provider]  sotalol (BETAPACE) 80 MG tablet Take 80 mg by mouth 2 (two) times daily. 09/12/19  Yes [provider]  amLODipine (NORVASC) 10 MG tablet Take 1 tablet (10 mg total) by mouth daily. Patient not taking: Reported on 12/20/2021 09/28/21   Emeterio Reeve, DO  Ipratropium-Albuterol (COMBIVENT) 20-100 MCG/ACT AERS respimat Inhale 1 puff into the lungs 4 (four) times daily as needed for wheezing. Patient not taking: Reported on 12/10/2021 11/10/21   Fritzi Mandes, MD  magic mouthwash (nystatin, hydrocortisone, diphenhydrAMINE) suspension Swish and spit 5 mLs 3 (three) times daily as needed for mouth pain. 09/23/21   Hughie Closs, PA-C  nitroGLYCERIN (NITROSTAT) 0.4 MG SL tablet Place 0.4 mg under the tongue every 5 (five) minutes as needed for chest pain. Maximum 3 doses, If no relief call md or 911.    [provider]  torsemide (DEMADEX) 20 MG tablet Take 10 mg by mouth as directed. Take on Tuesday and Friday Patient not taking: Reported on 12/10/2021    [provider]  allopurinol (ZYLOPRIM) 300 MG tablet Take 1 tablet (300 mg total) by mouth daily. 09/11/21 11/06/21  Lloyd Huger, MD    Physical Exam: Vitals:   12/20/21 1545 12/20/21 1600 12/20/21 1615 12/20/21 1630  BP: 99/68 102/62 105/66 98/65  Pulse: 65 66 71 68  Resp: _0 Temp:      TempSrc:      SpO2: 100% 100% 99% 100%  Weight:      Height:       Physical Exam Vitals and nursing note  reviewed.  Constitutional:      Appearance: Normal appearance.  HENT:     Head: Normocephalic and atraumatic.     Nose: Nose normal.     Mouth/Throat:     Mouth: Mucous membranes are moist.  Eyes:     Conjunctiva/sclera:  Conjunctivae normal.  Cardiovascular:     Rate and Rhythm: Tachycardia present. Rhythm irregular.  Pulmonary:     Effort: Pulmonary effort is normal.     Breath sounds: Normal breath sounds.  Abdominal:     General: Abdomen is flat. Bowel sounds are normal.     Palpations: Abdomen is soft.  Musculoskeletal:        General: Normal range of motion.     Cervical back: Normal range of motion.  Skin:    General: Skin is warm and dry.  Neurological:     Mental Status: She is alert.     Motor: Weakness present.  Psychiatric:        Mood and Affect: Mood normal.        Behavior: Behavior normal.     Data Reviewed: Relevant notes from primary care and specialist visits, past discharge summaries as available in EHR, including Care Everywhere. Prior diagnostic testing as pertinent to current admission diagnoses Updated medications and problem lists for reconciliation ED course, including vitals, labs, imaging, treatment and response to treatment Triage notes, nursing and pharmacy notes and ED provider's notes Notable results as noted in HPI Labs reviewed.  Troponin 135 >> 237, lactic acid 2.6 >> 1.8, BNP 286, TSH 0.77, white count 16.6, hemoglobin 8.3, hematocrit 26.2, platelet count 60, sodium 138, potassium 3.3, chloride 112, bicarb 22, glucose 98, BUN 21, creatinine 0.82, calcium 8.5, total protein 5.0, albumin 2.4, AST 111, ALT 67, alkaline phosphatase 165, total bilirubin 1.1, PT 16.1, INR 1.3 Respiratory viral panel is negative Chest x-ray reviewed by me shows no evidence of active cardiopulmonary disease CT scan of abdomen and pelvis shows bladder with wall thickening and irregularity and subtle adjacent inflammation consistent with cystitis. No other acute  abnormality within the abdomen or pelvis. Aortic atherosclerosis. Twelve-lead EKG reviewed by me shows A-fib with rapid ventricular rate, PVCs as well as ST depressions in the inferior leads There are no new results to review at this time.  Assessment and Plan: * Atrial fibrillation with rapid ventricular response (HCC) Patient with a known history of atrial fibrillation on chronic anticoagulation therapy who presents to the ER for evaluation of pain in both lower jaws with radiation to the left shoulder and left anterior chest wall associated with palpitations and found to be in rapid A-fib. She received 2 doses of IV Cardizem without any improvement and is currently on amiodarone drip per cardiology recommendation. Eliquis is currently on hold due thrombocytopenia per oncology recommendation.  We will place patient on IV heparin Hold sotalol Cardiology consult  DLBCL (diffuse large B cell lymphoma) ([place]) Patient with a history of diffuse large B-cell lymphoma currently on chemotherapy. Follow-up with oncology as an outpatient  Sepsis ([place]) As evidenced by hypotension that responded to IV fluid resuscitation, tachycardia, tachypnea, leukocytosis as well as lactic acidosis. Unclear source of sepsis at this time but patient has a history of recurrent UTI and CT scan of abdomen and pelvis shows bladder with wall thickening and irregularity and subtle inflammation consistent with cystitis. Results of UA not available at the time of this H&P Chart review shows patient has had ESBL E. coli UTI in 07/23 We will treat patient empirically with meropenem Continue IV fluid resuscitation Follow-up results of blood cultures  Elevated troponin Patient noted to have an uptrending troponin level which may be related to non-ST elevation MI versus demand ischemia from rapid A-fib Patient with a known history of coronary artery disease Status post left heart  cath in 2019 which showed one-vessel coronary  artery disease with occluded proximal RCA with ipsi- and contra- collaterals. Patent stent in first diagonal branch. Normal left ventricular function Place patient on heparin drip Patient had a recent 2D echocardiogram which was done on 12/01/21 showed a normal LVEF. Will defer repeating echocardiogram to cardiology to rule out regional wall motion abnormality Consult cardiology  Hypothyroidism Stable Continue Synthroid  Depression Continue Zoloft  Transaminitis Patient noted to have transaminitis which may be related to shock Obtain right upper quadrant ultrasound Monitor liver enzymes  Thrombocytopenia ([place]) Patient noted to have thrombocytopenia probably from sepsis versus recent chemotherapy which patient received on 12/17/21. She has no evidence of bleeding at this time Monitor closely especially while on anticoagulation.    Hypokalemia Supplement potassium Check magnesium levels  History of breast cancer Status post radiation therapy and has completed a 5-year course of letrozole. Follow-up with oncology as an outpatient.  Diabetes mellitus without complication ([place]) Appears to be diet controlled Check blood sugars AC meals Maintain consistent carbohydrate diet      Advance Care Planning:   Code Status: Full Code   Consults: Oncology, cardiology  Family Communication: Greater than 50% of time was spent discussing patient's condition and plan of care with her at the bedside.  All questions and concerns have been addressed.  She verbalizes understanding and agrees with the plan.  She lists her daughter as her healthcare power of attorney.  CODE STATUS was discussed and she wishes to be a full code.  Severity of Illness: The appropriate patient status for this patient is INPATIENT. Inpatient status is judged to be reasonable and necessary in order to provide the required intensity of service to ensure the patient's safety. The patient's presenting symptoms, physical  exam findings, and initial radiographic and laboratory data in the context of their chronic comorbidities is felt to place them at high risk for further clinical deterioration. Furthermore, it is not anticipated that the patient will be medically stable for discharge from the hospital within 2 midnights of admission.   * I certify that at the point of admission it is my clinical judgment that the patient will require inpatient hospital care spanning beyond 2 midnights from the point of admission due to high intensity of service, high risk for further deterioration and high frequency of surveillance required.*  Author: Collier Bullock, MD 12/20/2021 4:53 PM  For on call review [email or web address].

## 2021-12-20 NOTE — Assessment & Plan Note (Signed)
As evidenced by hypotension that responded to IV fluid resuscitation, tachycardia, tachypnea, leukocytosis as well as lactic acidosis. Unclear source of sepsis at this time but patient has a history of recurrent UTI and CT scan of abdomen and pelvis shows bladder with wall thickening and irregularity and subtle inflammation consistent with cystitis. Results of UA not available at the time of this H&P Chart review shows patient has had ESBL E. coli UTI in 07/23 We will treat patient empirically with meropenem Continue IV fluid resuscitation Follow-up results of blood cultures

## 2021-12-20 NOTE — Assessment & Plan Note (Signed)
Appears to be diet controlled Check blood sugars AC meals Maintain consistent carbohydrate diet

## 2021-12-20 NOTE — Consult Note (Signed)
Pharmacy Antibiotic Note  Stacey Stone is a 76 y.o. female admitted on 12/20/2021 with chest pain and new mild cough and subjective fevers x3 days. PMH significant for  A-fib on Eliquis, B-cell lymphoma on chemotherapy, CAD, HTN, DM, hypothyroidism. Patient has a history of ESBL E. coli (R-amp, cefazolin, ceftriaxone, cipro; I-amp/sulb) UTI 09/2021. Patient denied urinary symptoms on presentation, UA pending. Of note, patient was on chronic suppressive levofloxacin PTA. Pharmacy has been consulted for Meropenem dosing.  Plan: Day 1 of antibiotics Initiate Meropenem 1g Q8H Continue to monitor renal function and follow culture results  Height: '5\' 2"'$  (157.5 cm) Weight: 71.2 kg (157 lb) IBW/kg (Calculated) : 50.1  Temp (24hrs), Avg:99.1 F (37.3 C), Min:99.1 F (37.3 C), Max:99.1 F (37.3 C)  Recent Labs  Lab 12/17/21 0838 12/20/21 0706 12/20/21 0727 12/20/21 0817 12/20/21 1005  WBC 11.6* 16.6*  --   --   --   CREATININE 0.81  --   --  0.82  --   LATICACIDVEN  --   --  2.6*  --  1.8    Estimated Creatinine Clearance: 53.1 mL/min (by C-G formula based on SCr of 0.82 mg/dL).    Allergies  Allergen Reactions   Codeine Other (See Comments)    Stroke like symptoms   Tramadol Itching    Antimicrobials this admission: 10/1 Vancomycin x1 10/1 Cefepime x1 10/1 Meropenem >>  Dose adjustments this admission: N/A  Microbiology results: 10/1 BCx: IP  Thank you for allowing pharmacy to be a part of this patient's care.  Gretel Acre, PharmD PGY1 Pharmacy Resident 12/20/2021 1:37 PM

## 2021-12-20 NOTE — Assessment & Plan Note (Signed)
Patient with a history of diffuse large B-cell lymphoma currently on chemotherapy. Follow-up with oncology as an outpatient

## 2021-12-20 NOTE — ED Notes (Signed)
Pt transported to CT ?

## 2021-12-20 NOTE — ED Provider Notes (Signed)
[facility] Provider Note    Event Date/Time   First MD Initiated Contact with Patient 12/20/21 [phone]     (approximate)   History   Tachycardia   HPI  Stacey Stone is a 75 y.o. 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  She states that when her A-fib runs fast, she gets discomfort in her jaw, which she started having last night; none currently.  She denies frank chest pain.  Denies shortness of breath.  She has been compliant with medications, aside from her blood pressure medications which she holds when her blood pressure is normal.  Review of systems negative for abdominal pain, nausea or vomiting, dysuria.  No bleeding from the stools or bleeding elsewhere.  No trauma.  Lives at home with her child who has Down syndrome.  History was obtained via the patient and review of external medical notes, including oncology note dated 12/17/2021 when she had a cycle of chemotherapy.      Physical Exam   Triage Vital Signs: ED Triage Vitals  Enc Vitals Group     BP 12/20/21 0657 (!) 99/51     Pulse Rate 12/20/21 0657 (!) 148     Resp 12/20/21 0657 20     Temp 12/20/21 0657 99.1 F (37.3 C)     Temp src --      SpO2 12/20/21 0657 93 %     Weight 12/20/21 0655 157 lb (71.2 kg)     Height 12/20/21 0655 '5\' 2"'$  (1.575 m)     Head Circumference --      Peak Flow --      Pain Score 12/20/21 0655 0     Pain Loc --      Pain Edu? --      Excl. in [place]? --     Most recent vital signs: Vitals:   12/20/21 0900 12/20/21 1000  BP: (!) 79/62 (!) 111/51  Pulse: (!) 133 (!) 131  Resp: 20 (!) 28  Temp:    SpO2: 100% 100%    General: Awake, no distress.  Initially borderline hypotension 99/51 but normalized to 117/60s when I assessed her in room.  Alert and  oriented and pleasant.  Speaking in full sentences. CV:  Good peripheral perfusion.  Regular rate 120-140s Resp:  Normal effort.  Lungs clear no focality or wheezing Abd:  No distention.  Abdomen soft and nontender    ED Results / Procedures / Treatments   Labs (all labs ordered are listed, but only abnormal results are displayed) Labs Reviewed  CBC WITH DIFFERENTIAL/PLATELET - Abnormal; Notable for the following components:      Result Value   WBC 16.6 (*)    RBC 2.69 (*)    Hemoglobin 8.3 (*)    HCT 26.2 (*)    RDW 22.9 (*)    Platelets 60 (*)    Neutro Abs 15.4 (*)    Lymphs Abs 0.0 (*)    Monocytes Absolute 0.0 (*)    Abs Immature Granulocytes 1.05 (*)    All other components within normal limits  BRAIN NATRIURETIC PEPTIDE - Abnormal; Notable for the following components:   B Natriuretic Peptide 286.6 (*)    All other components within normal limits  PROTIME-INR - Abnormal; Notable for the following components:  Prothrombin Time 16.1 (*)    INR 1.3 (*)    All other components within normal limits  LACTIC ACID, PLASMA - Abnormal; Notable for the following components:   Lactic Acid, Venous 2.6 (*)    All other components within normal limits  COMPREHENSIVE METABOLIC PANEL - Abnormal; Notable for the following components:   Potassium 3.3 (*)    Chloride 112 (*)    Calcium 8.5 (*)    Total Protein 5.0 (*)    Albumin 2.4 (*)    AST 111 (*)    ALT 67 (*)    Alkaline Phosphatase 165 (*)    Anion gap 4 (*)    All other components within normal limits  T4, FREE - Abnormal; Notable for the following components:   Free T4 1.54 (*)    All other components within normal limits  HEPARIN LEVEL (UNFRACTIONATED) - Abnormal; Notable for the following components:   Heparin Unfractionated >1.10 (*)    All other components within normal limits  TROPONIN I (HIGH SENSITIVITY) - Abnormal; Notable for the following components:   Troponin I (High Sensitivity) 135 (*)    All other  components within normal limits  TROPONIN I (HIGH SENSITIVITY) - Abnormal; Notable for the following components:   Troponin I (High Sensitivity) 237 (*)    All other components within normal limits  RESP PANEL BY RT-PCR (FLU A&B, COVID) ARPGX2  CULTURE, BLOOD (ROUTINE X 2)  CULTURE, BLOOD (ROUTINE X 2)  TSH  LACTIC ACID, PLASMA  APTT  URINALYSIS, ROUTINE W REFLEX MICROSCOPIC  HEPATITIS PANEL, ACUTE     I reviewed labs and they are notable for lactic 2.6 and WBC 16.7  EKG  ED ECG REPORT I, Lucillie Garfinkel, the attending physician, personally viewed and interpreted this ECG.   Date: 12/20/2021  EKG Time: 0657  Rate: 147  Rhythm: atrial fibrillation, rate 147  Axis: nl  Intervals:none  ST&T Change: AF RVR     RADIOLOGY I independently reviewed and interpreted CXR and see no focal opacity or pneumothorax   PROCEDURES:  Critical Care performed: Yes, see critical care procedure note(s)  .Critical Care  Performed by: Lucillie Garfinkel, MD Authorized by: Lucillie Garfinkel, MD   Critical care provider statement:    Critical care time (minutes):  30   Critical care was necessary to treat or prevent imminent or life-threatening deterioration of the following conditions:  Circulatory failure and sepsis   Critical care was time spent personally by me on the following activities:  Development of treatment plan with patient or surrogate, ordering and review of laboratory studies, ordering and review of radiographic studies, pulse oximetry, re-evaluation of patient's condition, review of old charts, examination of patient and evaluation of patient's response to treatment    MEDICATIONS ORDERED IN ED: Medications  vancomycin (VANCOREADY) IVPB 1500 mg/300 mL (has no administration in time range)  amiodarone (NEXTERONE) 1.8 mg/mL load via infusion 150 mg (has no administration in time range)    Followed by  amiodarone (NEXTERONE PREMIX) 360-4.14 MG/200ML-% (1.8 mg/mL) IV infusion (has no  administration in time range)    Followed by  amiodarone (NEXTERONE PREMIX) 360-4.14 MG/200ML-% (1.8 mg/mL) IV infusion (has no administration in time range)  heparin bolus via infusion 3,750 Units (has no administration in time range)  heparin ADULT infusion 100 units/mL (25000 units/245m) (has no administration in time range)  potassium chloride (KLOR-CON) packet 40 mEq (has no administration in time range)  diltiazem (CARDIZEM) injection 10 mg (10 mg Intravenous  Given 12/20/21 0719)  sodium chloride 0.9 % bolus 1,000 mL (0 mLs Intravenous Stopped 12/20/21 0813)  acetaminophen (TYLENOL) tablet 1,000 mg (1,000 mg Oral Given 12/20/21 0851)  diltiazem (CARDIZEM) injection 15 mg (15 mg Intravenous Given 12/20/21 0812)  sodium chloride 0.9 % bolus 1,000 mL (0 mLs Intravenous Stopped 12/20/21 1001)  ceFEPIme (MAXIPIME) 2 g in sodium chloride 0.9 % 100 mL IVPB (0 g Intravenous Stopped 12/20/21 0925)  iohexol (OMNIPAQUE) 300 MG/ML solution 100 mL (100 mLs Intravenous Contrast Given 12/20/21 0934)    Consultants:  I spoke with cardiology Dr Humphrey Rolls regarding care plan for this patient.   IMPRESSION / MDM / ASSESSMENT AND PLAN / ED COURSE  I reviewed the triage vital signs and the nursing notes.                              Differential diagnosis includes, but is not limited to, atrial fibrillation with rapid ventricular response, acs, infection/sepsis metabolic derangement/AKI   The patient is on the cardiac monitor to evaluate for evidence of arrhythmia and/or significant heart rate changes.  MDM: This is a patient immunosuppressed on chemotherapy with atrial fibrillation and rapid ventricular response, along with some jaw discomfort.  Normotension.  Afebrile.  We will treat atrial fibrillation and RVR with Cardizem and fluids, infectious work-up, ECG/trops  10:45 AM Pt with WBC 16.7 and lactic 2.6 iso AF RVR and cough/subj fever and immunocompromised will provide empiric abx and sepsis bolus now.  CXR without sign of pna. Trop elevated no ischemic changes on ECG; no chest pain; likely demand from RVR/sepsis will continue to trend.  She is still tachycardic 120-140s and normotensive despite 1L NS and 10, 15 diltiazem. With concern for sepsis will complete another liter for full 30 cc/kg sepsis bolus and start abx and con't to monitor HR for now rather than give further pharmacologic rate control as HR may be compensatory for infection. If HR still high after fluid resuscitated then will trial more rate control.   10:45 AM LFTs elevated, sent hepatitis panel and CT scan of the abdomen pelvis. Patient now with recurrence of her jaw pain, heart rate remains elevated in the 120s to 140s, now borderline hypotensive 80s over 60s.  She is on sotalol and Eliquis but did not take either of these this morning.  I consulted Dr. Humphrey Rolls of cardiology for management of rate/rhythm control at this time, also discussed elevated troponin in the 130s with jaw pain, and was advised for heparin and amiodarone at this time, with plans to hold sotalol and Eliquis.  CT with wall thickening.  Urinalysis pending collection, patient incontinent of urine which delayed collection.  Prior to starting amiodarone, blood pressure improved but remains tachycardic.  Patient's presentation is most consistent with acute presentation with potential threat to life or bodily function.       FINAL CLINICAL IMPRESSION(S) / ED DIAGNOSES   Final diagnoses:  Atrial fibrillation with RVR ([place])     Rx / DC Orders   ED Discharge Orders     None        Note:  This document was prepared using Dragon voice recognition software and may include unintentional dictation errors.    Lucillie Garfinkel, MD 12/20/21 1045

## 2021-12-20 NOTE — Assessment & Plan Note (Signed)
Patient noted to have thrombocytopenia probably from sepsis versus recent chemotherapy which patient received on 12/17/21. She has no evidence of bleeding at this time Monitor closely especially while on anticoagulation.

## 2021-12-20 NOTE — ED Notes (Signed)
Pt's port accessed by Romilda Joy, Medic at this time.

## 2021-12-20 NOTE — Consult Note (Signed)
Stacey Stone is a 76 y.o. female  [id number]  Primary Cardiologist: Dr. Dulce Sellar Reason for Consultation: Atrial fibrillation  HPI: This is a 76 year old white female who was doing well until yesterday when she started feeling palpitation chest tightness and shortness of breath.  Patient felt like she she was going to pass out with frequent dizziness.  She also had left jaw pain.   Review of Systems: No orthopnea PND or leg swelling   Past Medical History:  Diagnosis Date   Abdominal pain, left lower quadrant    epiploic appendagitis   Anginal pain (HCC)    Arthritis 06/06/2018   knee   Asthma    mild   Atrial fibrillation (HCC)    Breast cancer (HCC)    Coronary artery disease 06/06/2018   1 artery 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 Mamie Nick. 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   No pertinent past medical history    Pancytopenia ([place])    Personal history of radiation therapy    Sleep apnea 06/06/2018   O2- 2l at bedtime and BiPap @ bedtime    (Not in a hospital admission)     apixaban  5 mg Oral BID   potassium chloride  40 mEq Oral BID    Infusions:  amiodarone 60 mg/hr (12/20/21 1052)   Followed by   amiodarone     vancomycin 1,500 mg (12/20/21 1051)    Allergies  Allergen Reactions   Codeine Other (See Comments)    Stroke like symptoms   Tramadol Itching    Social History   Socioeconomic History   Marital status: Widowed    Spouse name: Not on file   Number of children: Not on file   Years of education: Not on file   Highest education level: Not on file  Occupational History   Not on file  Tobacco Use   Smoking status: Never   Smokeless tobacco: Never  Vaping Use   Vaping Use:  Never used  Substance and Sexual Activity   Alcohol use: No   Drug use: No   Sexual activity: Not Currently  Other Topics Concern   Not on file  Social History Narrative   Not on file   Social Determinants of Health   Financial Resource Strain: Not on file  Food Insecurity: No Food Insecurity (11/26/2021)   Hunger Vital Sign    Worried About Running Out of Food in the Last Year: Never true    Ran Out of Food in the Last Year: Never true  Transportation Needs: No Transportation Needs (11/26/2021)   PRAPARE - Hydrologist (Medical): No    Lack of Transportation (Non-Medical): No  Physical Activity: Not on file  Stress: Not on file  Social Connections: Not on file  Intimate Partner Violence: Not At Risk (11/26/2021)   Humiliation, Afraid, Rape, and Kick questionnaire    Fear of Current or Ex-Partner: No    Emotionally Abused: No    Physically Abused: No    Sexually Abused: No    Family History  Problem Relation Age of Onset   Breast cancer Mother 74   Heart disease Father    Breast cancer  Paternal Aunt 73   Breast cancer Cousin    Anesthesia problems Neg Hx    Hypotension Neg Hx    Malignant hyperthermia Neg Hx    Pseudochol deficiency Neg Hx     PHYSICAL EXAM: Vitals:   12/20/21 0900 12/20/21 1000  BP: (!) 79/62 (!) 111/51  Pulse: (!) 133 (!) 131  Resp: 20 (!) 28  Temp:    SpO2: 100% 100%     Intake/Output Summary (Last 24 hours) at 12/20/2021 1139 Last data filed at 12/20/2021 1001 Gross per 24 hour  Intake 2100 ml  Output --  Net 2100 ml    General:  Well appearing. No respiratory difficulty HEENT: normal Neck: supple. no JVD. Carotids 2+ bilat; no bruits. No lymphadenopathy or thryomegaly appreciated. Cor: PMI nondisplaced. Regular rate & rhythm. No rubs, gallops or murmurs. Lungs: clear Abdomen: soft, nontender, nondistended. No hepatosplenomegaly. No bruits or masses. Good bowel sounds. Extremities: no cyanosis, clubbing,  rash, edema Neuro: alert & oriented x 3, cranial nerves grossly intact. moves all 4 extremities w/o difficulty. Affect pleasant.  ECG: Atrial fibrillation with rapid ventricular response rate and nonspecific ST-T changes  Results for orders placed or performed during the hospital encounter of 12/20/21 (from the past 24 hour(s))  CBC with Differential     Status: Abnormal   Collection Time: 12/20/21  7:06 AM  Result Value Ref Range   WBC 16.6 (H) 4.0 - 10.5 K/uL   RBC 2.69 (L) 3.87 - 5.11 MIL/uL   Hemoglobin 8.3 (L) 12.0 - 15.0 g/dL   HCT 26.2 (L) 36.0 - 46.0 %   MCV 97.4 80.0 - 100.0 fL   MCH 30.9 26.0 - 34.0 pg   MCHC 31.7 30.0 - 36.0 g/dL   RDW 22.9 (H) 11.5 - 15.5 %   Platelets 60 (L) 150 - 400 K/uL   nRBC 0.0 0.0 - 0.2 %   Neutrophils Relative % 94 %   Neutro Abs 15.4 (H) 1.7 - 7.7 K/uL   Lymphocytes Relative 0 %   Lymphs Abs 0.0 (L) 0.7 - 4.0 K/uL   Monocytes Relative 0 %   Monocytes Absolute 0.0 (L) 0.1 - 1.0 K/uL   Eosinophils Relative 0 %   Eosinophils Absolute 0.0 0.0 - 0.5 K/uL   Basophils Relative 0 %   Basophils Absolute 0.1 0.0 - 0.1 K/uL   WBC Morphology MILD LEFT SHIFT (1-5% METAS, OCC MYELO, OCC BANDS)    Smear Review PLATELETS APPEAR DECREASED    Immature Granulocytes 6 %   Abs Immature Granulocytes 1.05 (H) 0.00 - 0.07 K/uL   Tear Drop Cells PRESENT   Brain natriuretic peptide     Status: Abnormal   Collection Time: 12/20/21  7:06 AM  Result Value Ref Range   B Natriuretic Peptide 286.6 (H) 0.0 - 100.0 pg/mL  Troponin I (High Sensitivity)     Status: Abnormal   Collection Time: 12/20/21  7:06 AM  Result Value Ref Range   Troponin I (High Sensitivity) 135 (HH) <18 ng/L  TSH     Status: None   Collection Time: 12/20/21  7:06 AM  Result Value Ref Range   TSH 0.771 0.350 - 4.500 uIU/mL  Protime-INR     Status: Abnormal   Collection Time: 12/20/21  7:06 AM  Result Value Ref Range   Prothrombin Time 16.1 (H) 11.4 - 15.2 seconds   INR 1.3 (H) 0.8 - 1.2   Resp Panel by RT-PCR (Flu A&B, Covid) Anterior Nasal Swab  Status: None   Collection Time: 12/20/21  7:27 AM   Specimen: Anterior Nasal Swab  Result Value Ref Range   SARS Coronavirus 2 by RT PCR NEGATIVE NEGATIVE   Influenza A by PCR NEGATIVE NEGATIVE   Influenza B by PCR NEGATIVE NEGATIVE  Lactic acid, plasma     Status: Abnormal   Collection Time: 12/20/21  7:27 AM  Result Value Ref Range   Lactic Acid, Venous 2.6 (HH) 0.5 - 1.9 mmol/L  Comprehensive metabolic panel     Status: Abnormal   Collection Time: 12/20/21  8:17 AM  Result Value Ref Range   Sodium 138 135 - 145 mmol/L   Potassium 3.3 (L) 3.5 - 5.1 mmol/L   Chloride 112 (H) 98 - 111 mmol/L   CO2 22 22 - 32 mmol/L   Glucose, Bld 98 70 - 99 mg/dL   BUN 21 8 - 23 mg/dL   Creatinine, Ser 0.82 0.44 - 1.00 mg/dL   Calcium 8.5 (L) 8.9 - 10.3 mg/dL   Total Protein 5.0 (L) 6.5 - 8.1 g/dL   Albumin 2.4 (L) 3.5 - 5.0 g/dL   AST 111 (H) 15 - 41 U/L   ALT 67 (H) 0 - 44 U/L   Alkaline Phosphatase 165 (H) 38 - 126 U/L   Total Bilirubin 1.1 0.3 - 1.2 mg/dL   GFR, Estimated >60 >60 mL/min   Anion gap 4 (L) 5 - 15  T4, free     Status: Abnormal   Collection Time: 12/20/21  8:17 AM  Result Value Ref Range   Free T4 1.54 (H) 0.61 - 1.12 ng/dL  Troponin I (High Sensitivity)     Status: Abnormal   Collection Time: 12/20/21 10:04 AM  Result Value Ref Range   Troponin I (High Sensitivity) 237 (HH) <18 ng/L  APTT     Status: None   Collection Time: 12/20/21 10:04 AM  Result Value Ref Range   aPTT 26 24 - 36 seconds  Heparin level (unfractionated)     Status: Abnormal   Collection Time: 12/20/21 10:04 AM  Result Value Ref Range   Heparin Unfractionated >1.10 (H) 0.30 - 0.70 IU/mL  Lactic acid, plasma     Status: None   Collection Time: 12/20/21 10:05 AM  Result Value Ref Range   Lactic Acid, Venous 1.8 0.5 - 1.9 mmol/L   CT Abdomen Pelvis W Contrast  Result Date: 12/20/2021 CLINICAL DATA:  Sepsis. EXAM: CT ABDOMEN AND PELVIS  WITH CONTRAST TECHNIQUE: Multidetector CT imaging of the abdomen and pelvis was performed using the standard protocol following bolus administration of intravenous contrast. RADIATION DOSE REDUCTION: This exam was performed according to the departmental dose-optimization program which includes automated exposure control, adjustment of the mA and/or kV according to patient size and/or use of iterative reconstruction technique. CONTRAST:  159m OMNIPAQUE IOHEXOL 300 MG/ML  SOLN COMPARISON:  01/06/2021. FINDINGS: Lower chest: No acute findings. Hepatobiliary: Normal liver. Small amount of fluid attenuation along the gallbladder fossa. Common bile duct measures 7 mm proximally with normal distal tapering. Pancreas: Unremarkable. No pancreatic ductal dilatation or surrounding inflammatory changes. Spleen: Normal in size without focal abnormality. Adrenals/Urinary Tract: No adrenal masses. Kidneys show symmetric enhancement. 1.4 cm low-attenuation left mid pole cortical mass consistent with a cyst, stable. No follow-up recommended no renal stones. No hydronephrosis. Ureters are normal in course and in caliber. Bladder mildly distended. Wall is mildly thickened and irregular with subtle adjacent inflammatory haziness. No bladder mass or stone. . Stomach/Bowel: Normal stomach. Small  bowel and colon are normal in caliber. No wall thickening. No inflammation. Vascular/Lymphatic: Aortic atherosclerosis. No aneurysm. No enlarged lymph nodes. Reproductive: Prominent postmenopausal uterus. 1.6 cm fibroid. No adnexal masses. Other: No abdominal wall hernia or abnormality. No abdominopelvic ascites. Musculoskeletal: No fracture or acute finding.  No bone lesion. IMPRESSION: 1. Bladder with wall thickening and irregularity and subtle adjacent inflammation consistent with cystitis. 2. No other acute abnormality within the abdomen or pelvis. 3. Aortic atherosclerosis. Electronically Signed   By: Lajean Manes M.D.   On: 12/20/2021  10:05   DG Chest Port 1 View  Result Date: 12/20/2021 CLINICAL DATA:  Chest pain and SOB. Elevated heart rate. B-cell lymphoma. EXAM: PORTABLE CHEST 1 VIEW COMPARISON:  11/25/2021 FINDINGS: There is a right chest wall port a catheter with tip at the cavoatrial junction. Heart size and mediastinal contours are unremarkable. There is no pleural effusion or edema identified. No airspace opacities. IMPRESSION: No active disease. Electronically Signed   By: Kerby Moors M.D.   On: 12/20/2021 07:43     ASSESSMENT AND PLAN: Atrial fibrillation with rapid ventricular response rate with associated symptoms of presyncope and chest tightness.  Patient normally takes Eliquis and sotalol.  Advise holding off on sotalol and start the patient on IV amiodarone.  Since patient has severe thrombocytopenia with platelet count 60,000 will not start the patient on heparin and continue Eliquis.  After starting amiodarone patient is gradually improving with her symptoms.  Mildly elevated troponin probably is related to rate related demand ischemia.  Will get echocardiogram to further evaluate valvular abnormalities as well as her LV function.  Retha Bither A

## 2021-12-20 NOTE — Consult Note (Signed)
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 History of presenting illness-  76 y.o. female with PMH listed at below who presents to ER for evaluation of chest tightness, palpitation.  She felt like going to pass out. Patient was found to have tachycardia with heart rate up to 200s.  EKG showed A-fib with rapid ventricular response.  Patient has a history of atrial fibrillation on Eliquis.  She also has a history of diffuse large B-cell lymphoma, on chemotherapy.  Interim PET scan showed good response complete remission.  Last chemotherapy was 12/17/2021, received Neulasta on 9/29/.23.   CBC showed platelet count of 60,000, 3 days ago count was 171,003 days ago.  In the past her platelet count has dropped after chemotherapies.  White count 16.6, ANC 15.4.  Hemoglobin 8.3, Hematology oncology was consulted for thrombocytopenia and anticoagulation.  Allergies  Allergen Reactions   Codeine Other (See Comments)    Stroke like symptoms   Tramadol Itching    Patient Active Problem List   Diagnosis Date Noted   Atrial fibrillation with rapid ventricular response ([place]) 12/20/2021   Elevated troponin 12/20/2021   Thrombocytopenia (HCC) 12/20/2021   Weakness 11/26/2021   COVID-19 virus RNA test result positive at limit of detection 11/26/2021   Elevated d-dimer 11/08/2021   Ulcer of RIGHT buttock ([place]) 11/08/2021   Thrombocytopenia due to drugs 11/07/2021   Pancytopenia due to chemotherapy ([place]) 11/07/2021   Sepsis due to COVID-19 ([place]) 11/06/2021   Hypokalemia 11/06/2021    Bacteremia due to ESBL-producing Escherichia coli source UTI 09/26/2021   Finger nail contusion, initial encounter 09/25/2021   Bacteremia 09/24/2021   Dehydration 09/23/2021   Febrile neutropenia ([place]) 09/23/2021   Diabetes mellitus without complication ([place])    Pancytopenia, chemotherapy induced(HCC)    Urinary tract infection    COVID-19 virus infection    DLBCL (diffuse large B cell lymphoma) ([place]) 09/10/2021   Peripheral edema 07/13/2019   Sepsis ([place]) 05/24/2019   Cellulitis of right lower extremity 05/24/2019   Bilateral edema of lower extremity 05/24/2019   Abnormal LFTs 06/06/2018   History of breast cancer 06/06/2018   Status post ablation of atrial fibrillation 02/08/2018   PMB (postmenopausal bleeding) 06/17/2016   Preoperative cardiovascular examination 05/13/2016   Primary cancer of upper outer quadrant of left female breast ([place]) 05/09/2016   Unstable angina ([place]) 09/24/2015   S/P cardiac catheterization 09/24/2015   History of total right knee replacement 06/25/2015   B12 deficiency 01/15/2015   Vitamin D deficiency 01/15/2015   Paroxysmal atrial fibrillation ([place]) 12/15/2014   Aortic valve disorder 11/06/2014   Hypersomnia with sleep apnea 11/06/2014   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   OSA on CPAP 01/02/2014   Angina at rest [facility]) 06/16/2013   Essential hypertension 06/16/2013   Hypercholesterolemia 06/16/2013   Hypothyroidism 06/16/2013   Rhegmatogenous retinal detachment of left eye 02/16/2011   Coronary artery disease 01/08/2006     Past Medical History:  Diagnosis Date   Abdominal pain, left lower quadrant    epiploic appendagitis   Anginal pain ([place])    Arthritis 06/06/2018   knee   Asthma    mild  Atrial fibrillation ([place])    Breast cancer ([place])    Coronary artery disease 06/06/2018   1 artery 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 Mamie Nick. 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   No pertinent past medical history    Pancytopenia ([place])    Personal history of radiation therapy    Sleep apnea 06/06/2018   O2- 2l at bedtime and BiPap @ bedtime     Past Surgical History:  Procedure Laterality Date   BREAST BIOPSY Left 2/136/2018   INVASIVE MAMMARY CARCINOMA   BREAST BIOPSY Right 05/25/2016   FIBROCYSTIC CHANGE WITH CALCIFICATIONS    BREAST BIOPSY Right 05/29/2020   Affirm bx-"X" clip-FIBROADENOMA WITH ASSOCIATED CALCIFICATIONS. - NEGATIVE   BREAST EXCISIONAL BIOPSY Left 06/04/2016   INVASIVE MAMMARY CARCINOMA.    BREAST LUMPECTOMY Left 06/04/2016   INVASIVE MAMMARY CARCINOMA.    CARDIAC CATHETERIZATION     CARDIAC CATHETERIZATION N/A 09/24/2015   Procedure: Left Heart Cath and Coronary Angiography;  Surgeon: Isaias Cowman, MD;  Location: [place] [facility];  Service: Cardiovascular;  Laterality: N/A;   CARDIAC CATHETERIZATION N/A 09/24/2015   Procedure: Coronary Stent Intervention;  Surgeon: Isaias Cowman, MD;  Location: [place] [facility];  Service: Cardiovascular;  Laterality: N/A;   CHOLECYSTECTOMY  11/26   08/1970   COLONOSCOPY WITH PROPOFOL N/A 07/08/2021   Procedure: COLONOSCOPY WITH PROPOFOL;  Surgeon: Toledo, Benay Pike, MD;  Location: [facility];  Service: Gastroenterology;  Laterality: N/A;   DILATATION & CURETTAGE/HYSTEROSCOPY WITH MYOSURE N/A 05/05/2015   Procedure: Hysteroscopy and endometrial curretage;  Surgeon: Boykin Nearing, MD;  Location: [facility];  Service: Gynecology;  Laterality: N/A;   DILATION AND CURETTAGE OF UTERUS     EXCISION MASS FROM NECK  Right    Dr. Tami Ribas   EYE SURGERY  05/29/2020   bil cataract 9/08   HAMMER TOE SURGERY Right    IR BONE MARROW BIOPSY & ASPIRATION  09/09/2021   IR IMAGING GUIDED PORT INSERTION   09/09/2021   JOINT REPLACEMENT Bilateral 2008, 11/26/ 2012   Partial Knee Replacements   LEFT HEART CATH AND CORONARY ANGIOGRAPHY N/A 10/25/2017   Procedure: LEFT HEART CATH AND CORONARY ANGIOGRAPHY;  Surgeon: Teodoro Spray, MD;  Location: [place] [facility];  Service: Cardiovascular;  Laterality: N/A;   LEFT HEART CATH AND CORONARY ANGIOGRAPHY N/A 10/26/2017   Procedure: LEFT HEART CATH AND CORONARY ANGIOGRAPHY;  Surgeon: Isaias Cowman, MD;  Location: [place] [facility];  Service: Cardiovascular;  Laterality: N/A;   PARTIAL MASTECTOMY WITH NEEDLE LOCALIZATION Left 06/04/2016   Procedure: PARTIAL MASTECTOMY WITH NEEDLE LOCALIZATION;  Surgeon: Leonie Green, MD;  Location: [facility];  Service: General;  Laterality: Left;   SCLERAL BUCKLE  02/16/2011   Procedure: SCLERAL BUCKLE;  Surgeon: Hayden Pedro, MD;  Location: [place];  Service: Ophthalmology;  Laterality: Left;  Scleral Buckle Left Eye with Headscope Laser   SENTINEL NODE BIOPSY Left 06/04/2016   Procedure: SENTINEL NODE BIOPSY;  Surgeon: Leonie Green, MD;  Location: [facility];  Service: General;  Laterality: Left;    Social History   Socioeconomic History   Marital status: Widowed    Spouse name: Not on file   Number of children:  Not on file   Years of education: Not on file   Highest education level: Not on file  Occupational History   Not on file  Tobacco Use   Smoking status: Never   Smokeless tobacco: Never  Vaping Use   Vaping Use: Never used  Substance and Sexual Activity   Alcohol use: No   Drug use: No   Sexual activity: Not Currently  Other Topics Concern   Not on file  Social History Narrative   Not on file   Social Determinants of Health   Financial Resource Strain: Not on file  Food Insecurity: No Food Insecurity (11/26/2021)   Hunger Vital Sign    Worried About Running Out of Food in the Last Year: Never true    Ran Out of Food in the Last Year: Never true  Transportation Needs:  No Transportation Needs (11/26/2021)   PRAPARE - Hydrologist (Medical): No    Lack of Transportation (Non-Medical): No  Physical Activity: Not on file  Stress: Not on file  Social Connections: Not on file  Intimate Partner Violence: Not At Risk (11/26/2021)   Humiliation, Afraid, Rape, and Kick questionnaire    Fear of Current or Ex-Partner: No    Emotionally Abused: No    Physically Abused: No    Sexually Abused: No     Family History  Problem Relation Age of Onset   Breast cancer Mother 50   Heart disease Father    Breast cancer Paternal Aunt 46   Breast cancer Cousin    Anesthesia problems Neg Hx    Hypotension Neg Hx    Malignant hyperthermia Neg Hx    Pseudochol deficiency Neg Hx      Current Facility-Administered Medications:    [COMPLETED] amiodarone (NEXTERONE) 1.8 mg/mL load via infusion 150 mg, 150 mg, Intravenous, Once, 150 mg at 12/20/21 1052 **FOLLOWED BY** amiodarone (NEXTERONE PREMIX) 360-4.14 MG/200ML-% (1.8 mg/mL) IV infusion, 60 mg/hr, Intravenous, Continuous, Last Rate: 33.3 mL/hr at 12/20/21 1052, 60 mg/hr at 12/20/21 1052 **FOLLOWED BY** amiodarone (NEXTERONE PREMIX) 360-4.14 MG/200ML-% (1.8 mg/mL) IV infusion, 30 mg/hr, Intravenous, Continuous, Lucillie Garfinkel, MD   apixaban Arne Cleveland) tablet 5 mg, 5 mg, Oral, BID, Lucillie Garfinkel, MD   potassium chloride (KLOR-CON) packet 40 mEq, 40 mEq, Oral, BID, Lucillie Garfinkel, MD   vancomycin (VANCOREADY) IVPB 1500 mg/300 mL, 1,500 mg, Intravenous, Once, Coulter, Carolyn, [facility], Last Rate: 150 mL/hr at 12/20/21 1051, 1,500 mg at 12/20/21 1051  Current Outpatient Medications:    acetaminophen (TYLENOL) 500 MG tablet, Take 500 mg by mouth every 4 (four) hours as needed., Disp: , Rfl:    amLODipine (NORVASC) 5 MG tablet, Take 10 mg by mouth daily., Disp: , Rfl:    apixaban (ELIQUIS) 5 MG TABS tablet, Take 1 tablet (5 mg total) by mouth 2 (two) times daily. HOLD till you get the Blood work done on 12/10/21 and d/w  Dr Grayland Ormond to see if you can restart it, Disp: 60 tablet, Rfl: 1   atorvastatin (LIPITOR) 40 MG tablet, Take 1 tablet (40 mg total) by mouth at bedtime., Disp: , Rfl:    Calcium Carbonate (CALCIUM 500 PO), Take by mouth., Disp: , Rfl:    cyanocobalamin (VITAMIN B12) 1000 MCG tablet, Take 1,000 mcg by mouth daily., Disp: , Rfl:    ferrous sulfate 325 (65 FE) MG tablet, Take 325 mg by mouth daily with breakfast., Disp: , Rfl:    Fluticasone-Salmeterol (ADVAIR) 100-50 MCG/DOSE AEPB, Inhale 1 puff  into the lungs 2 (two) times daily as needed (for asthma.)., Disp: , Rfl:    isosorbide mononitrate (IMDUR) 30 MG 24 hr tablet, Take 30 mg by mouth daily., Disp: , Rfl:    levothyroxine (SYNTHROID) 125 MCG tablet, Take 125 mcg by mouth daily., Disp: , Rfl:    loratadine (CLARITIN) 10 MG tablet, Take 10 mg by mouth daily., Disp: , Rfl:    ondansetron (ZOFRAN) 8 MG tablet, Take by mouth every 8 (eight) hours as needed for nausea or vomiting., Disp: , Rfl:    potassium chloride (KLOR-CON) 10 MEQ tablet, Take 1 tablet (10 mEq total) by mouth 2 (two) times daily., Disp: , Rfl:    predniSONE (DELTASONE) 20 MG tablet, Take 100 mg by mouth daily., Disp: , Rfl:    prochlorperazine (COMPAZINE) 10 MG tablet, Take 10 mg by mouth every 6 (six) hours as needed for nausea or vomiting., Disp: , Rfl:    RABEprazole (ACIPHEX) 20 MG tablet, Take 20 mg by mouth daily., Disp: , Rfl:    sertraline (ZOLOFT) 50 MG tablet, Take 50 mg by mouth at bedtime., Disp: , Rfl:    sotalol (BETAPACE) 80 MG tablet, Take 80 mg by mouth 2 (two) times daily., Disp: , Rfl:    amLODipine (NORVASC) 10 MG tablet, Take 1 tablet (10 mg total) by mouth daily. (Patient not taking: Reported on 12/20/2021), Disp: 30 tablet, Rfl: 0   Ipratropium-Albuterol (COMBIVENT) 20-100 MCG/ACT AERS respimat, Inhale 1 puff into the lungs 4 (four) times daily as needed for wheezing. (Patient not taking: Reported on 12/10/2021), Disp: 1 each, Rfl: 1   magic mouthwash  (nystatin, hydrocortisone, diphenhydrAMINE) suspension, Swish and spit 5 mLs 3 (three) times daily as needed for mouth pain., Disp: 540 mL, Rfl: 0   nitroGLYCERIN (NITROSTAT) 0.4 MG SL tablet, Place 0.4 mg under the tongue every 5 (five) minutes as needed for chest pain. Maximum 3 doses, If no relief call md or 911., Disp: , Rfl:    torsemide (DEMADEX) 20 MG tablet, Take 10 mg by mouth as directed. Take on Tuesday and Friday (Patient not taking: Reported on 12/10/2021), Disp: , Rfl:   Facility-Administered Medications Ordered in Other Encounters:    acetaminophen (TYLENOL) 325 MG tablet, , , ,    diphenhydrAMINE (BENADRYL) 25 mg capsule, , , ,    heparin lock flush 100 UNIT/ML injection, , , ,    heparin lock flush 100 UNIT/ML injection, , , ,    palonosetron (ALOXI) 0.25 MG/5ML injection, , , ,   Review of Systems  Constitutional:  Positive for fatigue. Negative for appetite change, chills and fever.  HENT:   Negative for hearing loss and voice change.   Eyes:  Negative for eye problems.  Respiratory:  Positive for shortness of breath. Negative for chest tightness and cough.   Cardiovascular:  Positive for palpitations. Negative for chest pain.  Gastrointestinal:  Negative for abdominal distention, abdominal pain and blood in stool.  Endocrine: Negative for hot flashes.  Genitourinary:  Negative for difficulty urinating and frequency.   Musculoskeletal:  Negative for arthralgias.  Skin:  Negative for itching and rash.  Neurological:  Negative for extremity weakness.  Hematological:  Negative for adenopathy.  Psychiatric/Behavioral:  Negative for confusion.     PHYSICAL EXAM Vitals:   12/20/21 0800 12/20/21 0845 12/20/21 0900 12/20/21 1000  BP:   (!) 79/62 (!) 111/51  Pulse: (!) 44 (!) 135 (!) 133 (!) 131  Resp: 19 (!) 23 20 (!) 28  Temp:      SpO2: 100% 100% 100% 100%  Weight:      Height:       Physical Exam Constitutional:      General: She is not in acute distress.     Appearance: She is not diaphoretic.  HENT:     Head: Normocephalic and atraumatic.     Nose: Nose normal.     Mouth/Throat:     Pharynx: No oropharyngeal exudate.  Eyes:     General: No scleral icterus. Cardiovascular:     Rate and Rhythm: Regular rhythm. Tachycardia present.     Heart sounds: No murmur heard. Pulmonary:     Effort: Pulmonary effort is normal. No respiratory distress.     Breath sounds: No rales.  Chest:     Chest wall: No tenderness.  Abdominal:     General: There is no distension.     Palpations: Abdomen is soft.     Tenderness: There is no abdominal tenderness.  Musculoskeletal:        General: Normal range of motion.     Cervical back: Normal range of motion and neck supple.  Skin:    General: Skin is warm and dry.     Coloration: Skin is pale.  Neurological:     Mental Status: She is alert and oriented to person, place, and time. Mental status is at baseline.     Cranial Nerves: No cranial nerve deficit.     Motor: No abnormal muscle tone.  Psychiatric:        Mood and Affect: Mood and affect normal.       LABORATORY STUDIES    Latest Ref Rng & Units 12/20/2021    7:06 AM 12/17/2021    8:38 AM 12/10/2021    9:17 AM  CBC  WBC 4.0 - 10.5 K/uL 16.6  11.6  7.0   Hemoglobin 12.0 - 15.0 g/dL 8.3  9.9  9.5   Hematocrit 36.0 - 46.0 % 26.2  29.7  28.7   Platelets 150 - 400 K/uL 60  171  342       Latest Ref Rng & Units 12/20/2021    8:17 AM 12/17/2021    8:38 AM 12/10/2021    9:17 AM  CMP  Glucose 70 - 99 mg/dL 98  141  151   BUN 8 - 23 mg/dL 21  11  10    Creatinine 0.44 - 1.00 mg/dL 0.82  0.81  0.71   Sodium 135 - 145 mmol/L 138  134  137   Potassium 3.5 - 5.1 mmol/L 3.3  3.9  3.5   Chloride 98 - 111 mmol/L 112  103  105   CO2 22 - 32 mmol/L 22  25  24    Calcium 8.9 - 10.3 mg/dL 8.5  9.4  9.3   Total Protein 6.5 - 8.1 g/dL 5.0  6.7  6.7   Total Bilirubin 0.3 - 1.2 mg/dL 1.1  0.8  0.5   Alkaline Phos 38 - 126 U/L 165  105  123   AST 15 - 41 U/L  111  30  31   ALT 0 - 44 U/L 67  15  20      RADIOGRAPHIC STUDIES: I have personally reviewed the radiological images as listed and agreed with the findings in the report. CT Abdomen Pelvis W Contrast  Result Date: 12/20/2021 CLINICAL DATA:  Sepsis. EXAM: CT ABDOMEN AND PELVIS WITH CONTRAST TECHNIQUE: Multidetector CT imaging of the abdomen and pelvis  was performed using the standard protocol following bolus administration of intravenous contrast. RADIATION DOSE REDUCTION: This exam was performed according to the departmental dose-optimization program which includes automated exposure control, adjustment of the mA and/or kV according to patient size and/or use of iterative reconstruction technique. CONTRAST:  167m OMNIPAQUE IOHEXOL 300 MG/ML  SOLN COMPARISON:  01/06/2021. FINDINGS: Lower chest: No acute findings. Hepatobiliary: Normal liver. Small amount of fluid attenuation along the gallbladder fossa. Common bile duct measures 7 mm proximally with normal distal tapering. Pancreas: Unremarkable. No pancreatic ductal dilatation or surrounding inflammatory changes. Spleen: Normal in size without focal abnormality. Adrenals/Urinary Tract: No adrenal masses. Kidneys show symmetric enhancement. 1.4 cm low-attenuation left mid pole cortical mass consistent with a cyst, stable. No follow-up recommended no renal stones. No hydronephrosis. Ureters are normal in course and in caliber. Bladder mildly distended. Wall is mildly thickened and irregular with subtle adjacent inflammatory haziness. No bladder mass or stone. . Stomach/Bowel: Normal stomach. Small bowel and colon are normal in caliber. No wall thickening. No inflammation. Vascular/Lymphatic: Aortic atherosclerosis. No aneurysm. No enlarged lymph nodes. Reproductive: Prominent postmenopausal uterus. 1.6 cm fibroid. No adnexal masses. Other: No abdominal wall hernia or abnormality. No abdominopelvic ascites. Musculoskeletal: No fracture or acute finding.  No  bone lesion. IMPRESSION: 1. Bladder with wall thickening and irregularity and subtle adjacent inflammation consistent with cystitis. 2. No other acute abnormality within the abdomen or pelvis. 3. Aortic atherosclerosis. Electronically Signed   By: DLajean ManesM.D.   On: 12/20/2021 10:05   DG Chest Port 1 View  Result Date: 12/20/2021 CLINICAL DATA:  Chest pain and SOB. Elevated heart rate. B-cell lymphoma. EXAM: PORTABLE CHEST 1 VIEW COMPARISON:  11/25/2021 FINDINGS: There is a right chest wall port a catheter with tip at the cavoatrial junction. Heart size and mediastinal contours are unremarkable. There is no pleural effusion or edema identified. No airspace opacities. IMPRESSION: No active disease. Electronically Signed   By: TKerby MoorsM.D.   On: 12/20/2021 07:43   NM PET Image Restag (PS) Skull Base To Thigh  Result Date: 12/09/2021 CLINICAL DATA:  Subsequent treatment strategy for diffuse large B-cell lymphoma. EXAM: NUCLEAR MEDICINE PET SKULL BASE TO THIGH TECHNIQUE: 8.8 mCi F-18 FDG was injected intravenously. Full-ring PET imaging was performed from the skull base to thigh after the radiotracer. CT data was obtained and used for attenuation correction and anatomic localization. Fasting blood glucose: 110 mg/dl COMPARISON:  PET-CT 08/18/2021 FINDINGS: Mediastinal blood pool activity: SUV max 1.6 Liver activity: SUV max 3.2 NECK: Previously enlarged and hypermetabolic RIGHT cervical and supraclavicular nodes are no longer measurable. No residual metabolic activity. There is intense residual metabolic activity associated mucosal thickening in the anterior RIGHT maxillary sinus with SUV max equal 16 compared SUV max equal 23. Lesion measures 13 x 6 mm (image 18/2) compared to 17 x 9 mm. Incidental CT findings: None. CHEST: Interval resolution LEFT size and metabolic activity of mediastinal lymph nodes. No residual enlarged or metabolically active mediastinal lymph nodes. Previously seen RIGHT lower  lobe peribronchial nodule measures 19 mm (84/series 2) compared to 27 mm. This nodule has low metabolic activity with SUV max equal 1.9 decreased from SUV max equal 15.8. Incidental CT findings: Port in the anterior chest wall with tip in distal SVC. ABDOMEN/PELVIS: Interval resolution of the previously enlarged and hypermetabolic periaortic lymph nodes. No residual adenopathy. For example 5 mm LEFT periaortic node decreased from 18 mm with SUV max equal 0.8 decreased from SUV max equal 14.8.  Normal spleen with normal radiotracer activity. Resolution of focal activity. Incidental CT findings: Atherosclerotic calcification of the aorta. Uterus and adnexa unremarkable. SKELETON: Uniform mild increase in marrow activity without focality. Incidental CT findings: None. IMPRESSION: 1. Marked reduction in size and metabolic activity of previous seen cervical, supraclavicular, mediastinal improved retroperitoneal nodes. No residual adenopathy or metabolic activity. [place] 1 2. RIGHT lobe pulmonary nodule with very low metabolic activity and decreased in size. [place] 2 3. Mild uniform increase in marrow activity is consistent with a GCSF type marrow response. 4. Intense uptake within the polypoid lesion in the anterior RIGHT maxillary sinus. Lesion decreased slightly in size and metabolic activity. Favor lesion unrelated to lymphoma as such a dramatic response at other sites of disease as well as atypical location. Probable inflammatory or neoplastic polypoid lesion. Recommend MRI of the sinuses with contrast for further evaluation. These results will be called to the ordering clinician or representative by the Radiologist Assistant, and communication documented in the PACS or [employer]. Electronically Signed   By: Suzy Bouchard M.D.   On: 12/09/2021 11:53   ECHOCARDIOGRAM COMPLETE  Result Date: 12/01/2021    ECHOCARDIOGRAM REPORT   Patient Name:   [name] Date of Exam: 12/01/2021 Medical Rec  #:  [id number]            Height:       62.0 in Accession #:    [id number]           Weight:       161.4 lb Date of Birth:  08/01/1945           BSA:          1.745 m Patient Age:    76 years             BP:           125/82 mmHg Patient Gender: F                    HR:           81 bpm. Exam Location:  [facility] Procedure: 2D Echo, Cardiac Doppler, Color Doppler and Strain Analysis Indications:     Chemo Z09  History:         Patient has prior history of Echocardiogram examinations, most                  recent 10/24/2017. Arrythmias:Atrial Fibrillation; Risk                  Factors:Hypertension.  Sonographer:     Sherrie Sport Referring Phys:  [place] Diagnosing Phys: Isaias Cowman MD  Sonographer Comments: Global longitudinal strain was attempted. IMPRESSIONS  1. Left ventricular ejection fraction, by estimation, is 60 to 65%. The left ventricle has normal function. The left ventricle has no regional wall motion abnormalities. There is mild left ventricular hypertrophy. Left ventricular diastolic parameters were normal.  2. Right ventricular systolic function is normal. The right ventricular size is normal.  3. The mitral valve is normal in structure. Mild mitral valve regurgitation. No evidence of mitral stenosis.  4. The aortic valve is normal in structure. Aortic valve regurgitation is mild. No aortic stenosis is present.  5. The inferior vena cava is normal in size with greater than 50% respiratory variability, suggesting right atrial pressure of 3 mmHg. FINDINGS  Left Ventricle: Left ventricular ejection fraction, by estimation, is 60 to 65%. The left ventricle has normal function. The  left ventricle has no regional wall motion abnormalities. The left ventricular internal cavity size was normal in size. There is  mild left ventricular hypertrophy. Left ventricular diastolic parameters were normal. Right Ventricle: The right ventricular size is normal. No increase in right ventricular wall  thickness. Right ventricular systolic function is normal. Left Atrium: Left atrial size was normal in size. Right Atrium: Right atrial size was normal in size. Pericardium: There is no evidence of pericardial effusion. Mitral Valve: The mitral valve is normal in structure. Mild mitral valve regurgitation. No evidence of mitral valve stenosis. Tricuspid Valve: The tricuspid valve is normal in structure. Tricuspid valve regurgitation is mild . No evidence of tricuspid stenosis. Aortic Valve: The aortic valve is normal in structure. Aortic valve regurgitation is mild. No aortic stenosis is present. Aortic valve mean gradient measures 4.0 mmHg. Aortic valve peak gradient measures 7.0 mmHg. Aortic valve area, by VTI measures 2.70 cm. Pulmonic Valve: The pulmonic valve was normal in structure. Pulmonic valve regurgitation is not visualized. No evidence of pulmonic stenosis. Aorta: The aortic root is normal in size and structure. Venous: The inferior vena cava is normal in size with greater than 50% respiratory variability, suggesting right atrial pressure of 3 mmHg. IAS/Shunts: No atrial level shunt detected by color flow Doppler.  LEFT VENTRICLE PLAX 2D LVOT diam:     2.00 cm   Diastology LV SV:         52        LV e' medial:    6.20 cm/s LV SV Index:   30        LV E/e' medial:  8.3 LVOT Area:     3.14 cm  LV e' lateral:   6.64 cm/s                          LV E/e' lateral: 7.7                           3D Volume EF:                          3D EF:        53 %                          LV EDV:       73 ml                          LV ESV:       34 ml                          LV SV:        39 ml RIGHT VENTRICLE RV Basal diam:  3.10 cm RV S prime:     15.40 cm/s TAPSE (M-mode): 2.2 cm LEFT ATRIUM           Index        RIGHT ATRIUM           Index LA Vol (A2C): 49.7 ml 28.48 ml/m  RA Area:     13.10 cm LA Vol (A4C): 37.8 ml 21.66 ml/m  RA Volume:   31.90 ml  18.28 ml/m  AORTIC VALVE AV Area (Vmax):    2.24 cm AV  Area (Vmean):  2.37 cm AV Area (VTI):     2.70 cm AV Vmax:           132.00 cm/s AV Vmean:          83.500 cm/s AV VTI:            0.191 m AV Peak Grad:      7.0 mmHg AV Mean Grad:      4.0 mmHg LVOT Vmax:         94.00 cm/s LVOT Vmean:        63.000 cm/s LVOT VTI:          0.164 m LVOT/AV VTI ratio: 0.86  AORTA Ao Root diam: 3.33 cm MITRAL VALVE               TRICUSPID VALVE MV Area (PHT): 3.66 cm    TR Peak grad:   21.0 mmHg MV Decel Time: 207 msec    TR Vmax:        229.00 cm/s MV E velocity: 51.40 cm/s MV A velocity: 81.80 cm/s  SHUNTS MV E/A ratio:  0.63        Systemic VTI:  0.16 m                            Systemic Diam: 2.00 cm Isaias Cowman MD Electronically signed by Isaias Cowman MD Signature Date/Time: 12/01/2021/10:58:41 AM    Final    DG Abd 1 View  Result Date: 11/26/2021 CLINICAL DATA:  Nausea and vomiting today. EXAM: ABDOMEN - 1 VIEW COMPARISON:  None Available. FINDINGS: Bowel gas pattern is nonobstructive. Average stool burden. No organomegaly or abnormal calcifications. IMPRESSION: No evidence for acute  abnormality. Electronically Signed   By: Nolon Nations M.D.   On: 11/26/2021 13:30   DG Chest 1 View  Result Date: 11/25/2021 CLINICAL DATA:  Weakness, fatigue EXAM: CHEST  1 VIEW COMPARISON:  None Available. FINDINGS: Lungs are clear. No pneumothorax or pleural effusion. Cardiac size within normal limits. Right internal jugular chest port tip seen within the superior cavoatrial junction. Pulmonary vascularity is normal. No acute bone abnormality. IMPRESSION: No radiographic evidence of acute cardiopulmonary disease. Electronically Signed   By: Fidela Salisbury M.D.   On: 11/25/2021 23:03   DG Chest Port 1 View  Result Date: 11/08/2021 CLINICAL DATA:  Follow-up pneumonia. EXAM: PORTABLE CHEST 1 VIEW COMPARISON:  11/06/2021 FINDINGS: There is a right chest wall port a catheter with tip at the cavoatrial junction. Heart size appears normal scratch set stable  cardiomediastinal contours. No pleural effusion or edema. Streaky peribronchial opacities within the left base identified which appears stable to slightly increased from previous exam. IMPRESSION: Streaky peribronchial opacities within the left base, stable to slightly increased from previous exam. Electronically Signed   By: Kerby Moors M.D.   On: 11/08/2021 07:09   DG Chest Port 1 View  Result Date: 11/06/2021 CLINICAL DATA:  Questionable sepsis - evaluate for abnormality EXAM: PORTABLE CHEST 1 VIEW COMPARISON:  Chest x-ray 09/23/2021, CT chest 11/16/2016 FINDINGS: Accessed right Port-A-Cath with tip overlying the distal superior vena cava. The heart and mediastinal contours are unchanged. Atherosclerotic plaque. No focal consolidation. No pulmonary edema. No pleural effusion. No pneumothorax. No acute osseous abnormality. IMPRESSION: 1. No active disease. 2. Aortic Atherosclerosis (ICD10-I70.0). Electronically Signed   By: Iven Finn M.D.   On: 11/06/2021 20:59   US RENAL  Result Date: 09/25/2021 CLINICAL DATA:  Hydronephrosis EXAM: RENAL / URINARY  TRACT ULTRASOUND COMPLETE COMPARISON:  08/19/2021 FINDINGS: Right Kidney: Renal measurements: 10.5 x 5.7 x 6.2 cm = volume: 196 mL. Echogenicity within normal limits. No mass or hydronephrosis visualized. Left Kidney: Renal measurements: 10.8 x 5.8 x 5.8 cm = volume: 190.7 mL. Echogenicity within normal limits. No hydronephrosis. Exophytic 1.9 cm cyst off the interpolar region. Bladder: Appears normal for degree of bladder distention. Other: None. IMPRESSION: 1. No evidence of hydronephrosis or nephrolithiasis. 2. Benign left renal cyst. Otherwise unremarkable appearance of the kidneys. Electronically Signed   By: Randa Ngo M.D.   On: 09/25/2021 21:56   US Abdomen Limited RUQ (LIVER/GB)  Result Date: 09/23/2021 CLINICAL DATA:  Elevated LFTs EXAM: ULTRASOUND ABDOMEN LIMITED RIGHT UPPER QUADRANT COMPARISON:  01/06/2021 FINDINGS: Gallbladder:  Surgically removed Common bile duct: Diameter: 5.4 mm. Liver: No focal lesion identified. Within normal limits in parenchymal echogenicity. 3 mm calcification is noted in the right lobe of the liver stable from prior CT. Portal vein is patent on color Doppler imaging with normal direction of blood flow towards the liver. Other: None. IMPRESSION: Status post cholecystectomy. No acute abnormality to correspond with the given clinical history is noted. Electronically Signed   By: Inez Catalina M.D.   On: 09/23/2021 23:30   DG Chest Port 1 View  Result Date: 09/23/2021 CLINICAL DATA:  A 76 year old female presents with questionable sepsis. EXAM: PORTABLE CHEST 1 VIEW COMPARISON:  September 07, 2021. FINDINGS: EKG leads project over the chest. RIGHT-sided Port-A-Cath terminates at the caval to atrial junction. Cardiomediastinal contours and hilar structures are stable. Lungs are clear. No visible pneumothorax. On limited assessment there is no acute skeletal finding. IMPRESSION: 1. No active disease. 2. RIGHT-sided Port-A-Cath terminates at the caval to atrial junction. 3. Known adenopathy in the chest and lower neck better seen on prior PET imaging Electronically Signed   By: Zetta Bills M.D.   On: 09/23/2021 19:49     Assessment and plan-   #Thrombocytopenia secondary to recent chemotherapy. CBC daily for close monitoring.  #Atrial fibrillation with rapid ventricular response, patient has been seen by cardiology. Patient was previously on Eliquis.  Recommend to switch to heparin gtt. without bolus due to shorter half-life, in case her count drops in the next day or two.  #Leukocytosis, sepsis versus secondary to G-CSF. Patient had transient hypotension which responded to IV fluid resuscitation.  Lactic acidosis.  Unclear source of sepsis.  Currently on empiric antibiotics. Follow-up urinalysis, blood culture.  She was previously on chronic suppressive Levaquin due to recurrent UTI.  #Transaminitis,  possibly secondary to recent chemotherapy.  CT showed small amount of fluid attenuation along the gallbladder fossa.  Check ultrasound right upper quadrant.  #Chemotherapy-induced anemia, monitor closely. ##Diffuse large B-cell lymphoma, history of breast cancer follow-up outpatient  Thank you for allowing me to participate in the care of this patient.  Plan was discussed with hospitalist Dr. Kennith Maes, MD, PhD Hematology Oncology 12/20/2021

## 2021-12-20 NOTE — ED Notes (Signed)
Lunch meal tray given at this time.  

## 2021-12-20 NOTE — ED Notes (Signed)
Dr. Jacelyn Grip EDP at bedside for evaluation.

## 2021-12-20 NOTE — Assessment & Plan Note (Signed)
Patient with a known history of coronary artery disease Status post left heart cath in 2019 which showed one-vessel coronary artery disease with occluded proximal RCA with ipsi- and contra- collaterals. Patent stent in first diagonal branch. Normal left ventricular function

## 2021-12-20 NOTE — Assessment & Plan Note (Signed)
Status post radiation therapy and has completed a 5-year course of letrozole. Follow-up with oncology as an outpatient.

## 2021-12-20 NOTE — Assessment & Plan Note (Signed)
Stable Continue Synthroid 

## 2021-12-20 NOTE — Consult Note (Signed)
ANTICOAGULATION CONSULT NOTE - Initial Consult  Pharmacy Consult for heparin infusion Indication: chest pain/ACS  Allergies  Allergen Reactions   Codeine Other (See Comments)    Stroke like symptoms   Tramadol Itching    Patient Measurements: Height: '5\' 2"'$  (157.5 cm) Weight: 71.2 kg (157 lb) IBW/kg (Calculated) : 50.1 Heparin Dosing Weight: 65.2 kg  Vital Signs: Temp: 99.1 F (37.3 C) (10/01 0657) BP: 89/71 (10/01 1212) Pulse Rate: 110 (10/01 1212)  Labs: Recent Labs    12/20/21 0706 12/20/21 0817 12/20/21 1004  HGB 8.3*  --   --   HCT 26.2*  --   --   PLT 60*  --   --   APTT  --   --  26  LABPROT 16.1*  --   --   INR 1.3*  --   --   HEPARINUNFRC  --   --  >1.10*  CREATININE  --  0.82  --   TROPONINIHS 135*  --  237*    Estimated Creatinine Clearance: 53.1 mL/min (by C-G formula based on SCr of 0.82 mg/dL).   Medical History: Past Medical History:  Diagnosis Date   Abdominal pain, left lower quadrant    epiploic appendagitis   Anginal pain ([place])    Arthritis 06/06/2018   knee   Asthma    mild   Atrial fibrillation ([place])    Breast cancer ([place])    Coronary artery disease 06/06/2018   1 artery 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 Mamie Nick. at [facility] in [place]   Depression    Diabetes mellitus without complication ([place])    Dyspnea    Dysrhythmia    GERD (gastroesophageal reflux disease)    Hypertension    Hypothyroidism    MALT (mucosa associated lymphoid tissue) ([place]) 07/05/2014   Right neck mass resected 01/2014.   No pertinent past medical history    Pancytopenia ([place])    Personal history of radiation therapy    Sleep apnea 06/06/2018   O2- 2l at bedtime and BiPap @ bedtime    Medications:  Scheduled:   atorvastatin  40 mg Oral QHS   [START ON 12/21/2021] cyanocobalamin  1,000 mcg Oral Daily   ferrous sulfate  325 mg Oral Q breakfast   [START ON 12/21/2021] levothyroxine  125 mcg Oral Q0600   loratadine  10 mg Oral Daily   [START ON  12/21/2021] mometasone-formoterol  2 puff Inhalation BID   pantoprazole  40 mg Oral Daily   potassium chloride  40 mEq Oral BID   predniSONE  100 mg Oral Daily   sertraline  50 mg Oral QHS   sotalol  80 mg Oral BID   Infusions:   amiodarone 60 mg/hr (12/20/21 1052)   Followed by   amiodarone     lactated ringers     vancomycin 1,500 mg (12/20/21 1051)   PRN: acetaminophen, nitroGLYCERIN, ondansetron **OR** ondansetron (ZOFRAN) IV  Assessment: Stacey Stone is a 76 y.o. female presenting with chest pain and new mild cough and subjective fevers x3 days. PMH significant for  A-fib on Eliquis, B-cell lymphoma on chemotherapy, CAD, HTN, DM, hypothyroidism. Patient was on [facility] PTA per chart review, last dose of Apixaban was 9/30 PM. Per admitting MD, will initiate heparin infusion for ACS without bolus. Of note, patient found to have baseline thrombocytopenia and was thrombocytopenic at presentation. Pharmacy has been consulted to initiate and manage heparin infusion.   Baseline Labs: aPTT 26, HL >1.10, PT 16.1, INR 1.3, Hgb 8.3, Hct  26.2, Plt 60   Goal of Therapy:  Heparin level 0.3-0.7 units/ml aPTT 66-102 seconds Monitor platelets by anticoagulation protocol: Yes   Plan:  Start heparin infusion at 750 units/hr Check anti-Xa level in 8 hours  Continue to monitor aPTT until HL and aPTT correlate.  Check HL daily for correlation until HL and aPTT correlate. Switch to HL monitoring once HL and aPTT correlate.  Continue to monitor H&H and platelets while on heparin infusion  Thank you for allowing pharmacy to be a part of this patient's care.  Gretel Acre, PharmD PGY1 Pharmacy Resident 12/20/2021 12:54 PM

## 2021-12-20 NOTE — ED Notes (Signed)
Updated pt's daughter at bedside at this time. Denies any questions at this time.

## 2021-12-20 NOTE — ED Notes (Signed)
ED Provider at bedside. 

## 2021-12-20 NOTE — Assessment & Plan Note (Signed)
Supplement potassium Check magnesium levels 

## 2021-12-20 NOTE — Consult Note (Signed)
PHARMACY -  BRIEF ANTIBIOTIC NOTE   Pharmacy has received consult(s) for Vancomycin from an ED provider.  The patient's profile has been reviewed for ht/wt/allergies/indication/available labs.    One time order(s) placed for Vancomycin 1500 mg IV x1  Further antibiotics/pharmacy consults should be ordered by admitting physician if indicated.                       Thank you,  Gretel Acre, PharmD PGY1 Pharmacy Resident 12/20/2021 8:17 AM

## 2021-12-21 ENCOUNTER — Other Ambulatory Visit: Payer: Self-pay

## 2021-12-21 DIAGNOSIS — N390 Urinary tract infection, site not specified: Secondary | ICD-10-CM | POA: Diagnosis present

## 2021-12-21 DIAGNOSIS — I4891 Unspecified atrial fibrillation: Secondary | ICD-10-CM

## 2021-12-21 LAB — BASIC METABOLIC PANEL
Anion gap: 5 (ref 5–15)
BUN: 18 mg/dL (ref 8–23)
CO2: 22 mmol/L (ref 22–32)
Calcium: 8.6 mg/dL — ABNORMAL LOW (ref 8.9–10.3)
Chloride: 110 mmol/L (ref 98–111)
Creatinine, Ser: 0.61 mg/dL (ref 0.44–1.00)
GFR, Estimated: >60 mL/min (ref >60)
Glucose, Bld: 114 mg/dL — ABNORMAL HIGH (ref 70–99)
Potassium: 4.7 mmol/L (ref 3.5–5.1)
Sodium: 137 mmol/L (ref 135–145)

## 2021-12-21 LAB — HEPATITIS PANEL, ACUTE
HCV Ab: NONREACTIVE
Hep A IgM: NONREACTIVE
Hep B C IgM: NONREACTIVE
Hepatitis B Surface Ag: NONREACTIVE

## 2021-12-21 LAB — CBG MONITORING, ED
Glucose-Capillary: 109 mg/dL — ABNORMAL HIGH (ref 70–99)
Glucose-Capillary: 105 mg/dL — ABNORMAL HIGH (ref 70–99)

## 2021-12-21 LAB — CBC
HCT: 23.9 % — ABNORMAL LOW (ref 36.0–46.0)
Hemoglobin: 7.4 g/dL — ABNORMAL LOW (ref 12.0–15.0)
MCH: 30.7 pg (ref 26.0–34.0)
MCHC: 31 g/dL (ref 30.0–36.0)
MCV: 99.2 fL (ref 80.0–100.0)
Platelets: 52 10*3/uL — ABNORMAL LOW (ref 150–400)
RBC: 2.41 MIL/uL — ABNORMAL LOW (ref 3.87–5.11)
RDW: 23.3 % — ABNORMAL HIGH (ref 11.5–15.5)
WBC: 14.2 10*3/uL — ABNORMAL HIGH (ref 4.0–10.5)
nRBC: 0 % (ref 0.0–0.2)

## 2021-12-21 LAB — MAGNESIUM: Magnesium: 2.4 mg/dL (ref 1.7–2.4)

## 2021-12-21 LAB — PROTIME-INR
INR: 1.2 (ref 0.8–1.2)
Prothrombin Time: 15.4 seconds — ABNORMAL HIGH (ref 11.4–15.2)

## 2021-12-21 LAB — CORTISOL-AM, BLOOD: Cortisol - AM: 8.4 ug/dL (ref 6.7–22.6)

## 2021-12-21 LAB — APTT
aPTT: 60 seconds — ABNORMAL HIGH (ref 24–36)
aPTT: 45 seconds — ABNORMAL HIGH (ref 24–36)

## 2021-12-21 LAB — PROCALCITONIN: Procalcitonin: 26.49 ng/mL

## 2021-12-21 LAB — POTASSIUM: Potassium: 4.6 mmol/L (ref 3.5–5.1)

## 2021-12-21 LAB — GLUCOSE, CAPILLARY: Glucose-Capillary: 143 mg/dL — ABNORMAL HIGH (ref 70–99)

## 2021-12-21 LAB — HEPARIN LEVEL (UNFRACTIONATED): Heparin Unfractionated: 0.87 IU/mL — ABNORMAL HIGH (ref 0.30–0.70)

## 2021-12-21 MED ORDER — APIXABAN 5 MG PO TABS
5.0000 mg | ORAL_TABLET | Freq: Two times a day (BID) | ORAL | Status: DC
  Administered 2021-12-21 – 2021-12-23 (×5): 5 mg via ORAL
  Filled 2021-12-21 (×5): qty 1

## 2021-12-21 MED ORDER — AMIODARONE HCL 200 MG PO TABS
200.0000 mg | ORAL_TABLET | Freq: Two times a day (BID) | ORAL | Status: DC
  Administered 2021-12-21: 200 mg via ORAL
  Filled 2021-12-21: qty 1

## 2021-12-21 MED ORDER — SOTALOL HCL 80 MG PO TABS
80.0000 mg | ORAL_TABLET | Freq: Two times a day (BID) | ORAL | Status: DC
  Administered 2021-12-21 – 2021-12-29 (×16): 80 mg via ORAL
  Filled 2021-12-21 (×17): qty 1

## 2021-12-21 MED ORDER — CHLORHEXIDINE GLUCONATE CLOTH 2 % EX PADS
6.0000 | MEDICATED_PAD | Freq: Every day | CUTANEOUS | Status: DC
  Administered 2021-12-21 – 2021-12-29 (×8): 6 via TOPICAL

## 2021-12-21 MED ORDER — HEPARIN BOLUS VIA INFUSION
1000.0000 [IU] | Freq: Once | INTRAVENOUS | Status: AC
  Administered 2021-12-21: 1000 [IU] via INTRAVENOUS
  Filled 2021-12-21: qty 1000

## 2021-12-21 NOTE — ED Notes (Signed)
Spoke to pharmacy about a PTT not being ordered for Heparin drip and they reported that they would resolve the issue and order one stat.

## 2021-12-21 NOTE — Progress Notes (Signed)
SUBJECTIVE: Feeling much better no jaw pain or chest pain or shortness of breath.   Vitals:   12/21/21 0100 12/21/21 0115 12/21/21 0647 12/21/21 0730  BP: 111/72  116/67 (!) 116/93  Pulse: (!) 59 (!) 59 70 61  Resp: (!) '9 15 20 19  '$ Temp:   98.4 F (36.9 C) 97.8 F (36.6 C)  TempSrc:   Oral Oral  SpO2: 100% 100% 100% 100%  Weight:      Height:        Intake/Output Summary (Last 24 hours) at 12/21/2021 0845 Last data filed at 12/21/2021 0747 Gross per 24 hour  Intake 2112.32 ml  Output --  Net 2112.32 ml    LABS: Basic Metabolic Panel: Recent Labs    12/20/21 0817 12/20/21 1004 12/21/21 0048  NA 138  --  137  K 3.3*  --  4.7  CL 112*  --  110  CO2 22  --  22  GLUCOSE 98  --  114*  BUN 21  --  18  CREATININE 0.82  --  0.61  CALCIUM 8.5*  --  8.6*  MG  --  1.8  --    Liver Function Tests: Recent Labs    12/20/21 0817  AST 111*  ALT 67*  ALKPHOS 165*  BILITOT 1.1  PROT 5.0*  ALBUMIN 2.4*   No results for input(s): "LIPASE", "AMYLASE" in the last 72 hours. CBC: Recent Labs    12/20/21 0706 12/21/21 0048  WBC 16.6* 14.2*  NEUTROABS 15.4*  --   HGB 8.3* 7.4*  HCT 26.2* 23.9*  MCV 97.4 99.2  PLT 60* 52*   Cardiac Enzymes: No results for input(s): "CKTOTAL", "CKMB", "CKMBINDEX", "TROPONINI" in the last 72 hours. BNP: Invalid input(s): "POCBNP" D-Dimer: No results for input(s): "DDIMER" in the last 72 hours. Hemoglobin A1C: No results for input(s): "HGBA1C" in the last 72 hours. Fasting Lipid Panel: No results for input(s): "CHOL", "HDL", "LDLCALC", "TRIG", "CHOLHDL", "LDLDIRECT" in the last 72 hours. Thyroid Function Tests: Recent Labs    12/20/21 0706  TSH 0.771   Anemia Panel: No results for input(s): "VITAMINB12", "FOLATE", "FERRITIN", "TIBC", "IRON", "RETICCTPCT" in the last 72 hours.   PHYSICAL EXAM General: Well developed, well nourished, in no acute distress HEENT:  Normocephalic and atramatic Neck:  No JVD.  Lungs: Clear  bilaterally to auscultation and percussion. Heart: HRRR . Normal S1 and S2 without gallops or murmurs.  Abdomen: Bowel sounds are positive, abdomen soft and non-tender  Msk:  Back normal, normal gait. Normal strength and tone for age. Extremities: No clubbing, cyanosis or edema.   Neuro: Alert and oriented X 3. Psych:  Good affect, responds appropriately  TELEMETRY: Sinus rhythm  ASSESSMENT AND PLAN: Status post atrial fibrillation with rapid ventricular response rate and elevated troponin.  Elevated troponin due to rate related demand ischemia.  Patient is feeling much better and converted to sinus rhythm overnight.  Switch amiodarone from IV to p.o. 400 twice daily and tapering dose.  Plan on discharging with follow-up Dr. Raliegh Scarlet.  Principal Problem:   Atrial fibrillation with rapid ventricular response (HCC) Active Problems:   Hypothyroidism   Sepsis ([place])   DLBCL (diffuse large B cell lymphoma) (HCC)   Diabetes mellitus without complication ([place])   History of breast cancer   Hypokalemia   Elevated troponin   Thrombocytopenia (HCC)   Transaminitis   Depression    Stacey Stone A, MD, [facility] 12/21/2021 8:45 AM

## 2021-12-21 NOTE — ED Notes (Signed)
Meal tray brought to pt by dietary. Pt placed in high fowlers position to eat and provided with ice water. Pt denies need at this time, call light in reach. WCTM.

## 2021-12-21 NOTE — ED Notes (Signed)
First encounter with the patient, shift report given by Jenny Reichmann, RN. The pt denies any pain or discomfort at this time, she is aox4, call light left within reach, bed locked and in the lowest position. Per shift report the pt's Meropenem due '@2200'$ , has been held due to incompatibility with other medications currently infusing. This RN, will notify the pharmacy to reschedule the next dose. Close monitoring continued.

## 2021-12-21 NOTE — Consult Note (Signed)
ANTICOAGULATION CONSULT NOTE - Initial Consult  Pharmacy Consult for heparin infusion Indication: atrial fibrillation   Allergies  Allergen Reactions   Codeine Other (See Comments)    Stroke like symptoms   Tramadol Itching    Patient Measurements: Height: '5\' 2"'$  (157.5 cm) Weight: 71.2 kg (157 lb) IBW/kg (Calculated) : 50.1 Heparin Dosing Weight: 65.2 kg  Vital Signs: Temp: 97.8 F (36.6 C) (10/02 0730) Temp Source: Oral (10/02 0730) BP: 123/66 (10/02 1100) Pulse Rate: 67 (10/02 1102)  Labs: Recent Labs    12/20/21 0706 12/20/21 0817 12/20/21 1004 12/20/21 1311 12/20/21 1650 12/21/21 0048 12/21/21 1022  HGB 8.3*  --   --   --   --  7.4*  --   HCT 26.2*  --   --   --   --  23.9*  --   PLT 60*  --   --   --   --  52*  --   APTT  --   --  26  --   --  60* 45*  LABPROT 16.1*  --   --   --   --  15.4*  --   INR 1.3*  --   --   --   --  1.2  --   HEPARINUNFRC  --   --  >1.10*  --   --  0.87*  --   CREATININE  --  0.82  --   --   --  0.61  --   TROPONINIHS 135*  --  237* 518* 447*  --   --      Estimated Creatinine Clearance: 54.4 mL/min (by C-G formula based on SCr of 0.61 mg/dL).   Medical History: Past Medical History:  Diagnosis Date   Abdominal pain, left lower quadrant    epiploic appendagitis   Anginal pain ([place])    Arthritis 06/06/2018   knee   Asthma    mild   Atrial fibrillation ([place])    Breast cancer ([place])    Coronary artery disease 06/06/2018   1 artery 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 Mamie Nick. 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   No pertinent past medical history    Pancytopenia ([place])    Personal history of radiation therapy    Sleep apnea 06/06/2018   O2- 2l at  bedtime and BiPap @ bedtime    Medications:  Scheduled:   amiodarone  200 mg Oral BID   apixaban  5 mg Oral BID   atorvastatin  40 mg Oral QHS   cyanocobalamin  1,000 mcg Oral Daily   ferrous sulfate  325 mg Oral Q breakfast   levothyroxine  125 mcg Oral Q0600   loratadine  10 mg Oral Daily   mometasone-formoterol  2 puff Inhalation BID   pantoprazole  40 mg Oral Daily   predniSONE  100 mg Oral Daily   sertraline  50 mg Oral QHS   Infusions:   amiodarone Stopped (12/21/21 0747)   lactated ringers 125 mL/hr at 12/21/21 0751   meropenem (MERREM) IV Stopped (12/21/21 0610)   PRN: acetaminophen, nitroGLYCERIN, ondansetron **OR** ondansetron (ZOFRAN) IV  Assessment: JONTAE SONIER is a 76 y.o. female presenting with chest  pain and new mild cough and subjective fevers x3 days. PMH significant for  A-fib on Eliquis, B-cell lymphoma on chemotherapy, CAD, HTN, DM, hypothyroidism. Patient was on [facility] PTA per chart review, last dose of Apixaban was 9/30 PM. Per admitting MD, will initiate heparin infusion for ACS without bolus. Of note, patient found to have baseline thrombocytopenia and was thrombocytopenic at presentation. Pharmacy has been consulted to initiate and manage heparin infusion.   Baseline Labs: aPTT 26, HL >1.10, PT 16.1, INR 1.3, Hgb 8.3, Hct 26.2, Plt 60   10/2 @ 0048:  HL = 0.87, (elevated) aPTT = SUBtherapeutic   Goal of Therapy:  Heparin level 0.3-0.7 units/ml aPTT 66-102 seconds Monitor platelets by anticoagulation protocol: Yes   Plan: Transitioning IV heparin back to PTA Eliquis.  Discontinue IV heparin and start Eliquis 5 mg BID Give Eliquis at time of heparin gtt discontinuation   Glean Salvo, PharmD Clinical Pharmacist  12/21/2021 11:12 AM

## 2021-12-21 NOTE — ED Notes (Signed)
VO to stop amiodarone gtt s/p PO amiodarone admin.

## 2021-12-21 NOTE — Progress Notes (Deleted)
CODE SEPSIS - PHARMACY COMMUNICATION  **Broad Spectrum Antibiotics should be administered within 1 hour of Sepsis diagnosis**  Time Code Sepsis Called/Page Received: 2355  Antibiotics Ordered: Meropenem  Time of 1st antibiotic administration: 2119  Renda Rolls, PharmD, MBA 12/22/2021 12:00 AM

## 2021-12-21 NOTE — Progress Notes (Signed)
[facility] NAME: Stacey Stone    MR#:  [id number]  DATE OF BIRTH:  September 28, 1945  SUBJECTIVE:  patient seen in the ED earlier. Daughter at bedside. Came in after having jaw pain and increasing heart. Found to be in a fib with RVR was placed on Cardizem thereafter amiodarone now switch to oral sotalol. Back on PO eliquis. Continues with some cough with productive yellowish to whitish flat. Denies any burning urine no fever.            VITALS:  Blood pressure 120/67, pulse 75, temperature 98.5 F (36.9 C), resp. rate 16, height '5\' 2"'$  (1.575 m), weight 71.2 kg, SpO2 99 %.  PHYSICAL EXAMINATION:   GENERAL:  76 y.o.-year-old patient lying in the bed with no acute distress.alopecia  LUNGS: Normal breath sounds bilaterally, no wheezing, rales, rhonchi.  CARDIOVASCULAR: S1, S2 normal. No murmurs, rubs, or gallops.  ABDOMEN: Soft, nontender, nondistended. Bowel sounds present.  EXTREMITIES: No  edema b/l.    NEUROLOGIC: nonfocal  patient is alert and awake SKIN: No obvious rash, lesion, or ulcer.   LABORATORY PANEL:  CBC Recent Labs  Lab 12/21/21 0048  WBC 14.2*  HGB 7.4*  HCT 23.9*  PLT 52*    Chemistries  Recent Labs  Lab 12/20/21 0817 12/20/21 1004 12/21/21 0048  NA 138  --  137  K 3.3*  --  4.7  CL 112*  --  110  CO2 22  --  22  GLUCOSE 98  --  114*  BUN 21  --  18  CREATININE 0.82  --  0.61  CALCIUM 8.5*  --  8.6*  MG  --  1.8  --   AST 111*  --   --   ALT 67*  --   --   ALKPHOS 165*  --   --   BILITOT 1.1  --   --    Cardiac Enzymes No results for input(s): "TROPONINI" in the last 168 hours. RADIOLOGY:  US Abdomen Limited RUQ (LIVER/GB)  Result Date: 12/20/2021 CLINICAL DATA:  Transaminitis EXAM: ULTRASOUND ABDOMEN LIMITED RIGHT UPPER QUADRANT COMPARISON:  CT abdomen pelvis, 12/20/2021 FINDINGS: Gallbladder: Status post cholecystectomy. Gallbladder fossa fluid described by prior CT is not appreciated  by ultrasound. Common bile duct: Diameter: 0.7 cm Liver: No focal lesion identified. Increased parenchymal echogenicity. Portal vein is patent on color Doppler imaging with normal direction of blood flow towards the liver. Other: None. IMPRESSION: 1. Status post cholecystectomy. Gallbladder fossa fluid described by prior CT is not appreciated by ultrasound. 2. Hepatic steatosis. Electronically Signed   By: Delanna Ahmadi M.D.   On: 12/20/2021 14:01   CT Abdomen Pelvis W Contrast  Result Date: 12/20/2021 CLINICAL DATA:  Sepsis. EXAM: CT ABDOMEN AND PELVIS WITH CONTRAST TECHNIQUE: Multidetector CT imaging of the abdomen and pelvis was performed using the standard protocol following bolus administration of intravenous contrast. RADIATION DOSE REDUCTION: This exam was performed according to the departmental dose-optimization program which includes automated exposure control, adjustment of the mA and/or kV according to patient size and/or use of iterative reconstruction technique. CONTRAST:  196m OMNIPAQUE IOHEXOL 300 MG/ML  SOLN COMPARISON:  01/06/2021. FINDINGS: Lower chest: No acute findings. Hepatobiliary: Normal liver. Small amount of fluid attenuation along the gallbladder fossa. Common bile duct measures 7 mm proximally with normal distal tapering. Pancreas: Unremarkable. No pancreatic ductal dilatation or surrounding inflammatory changes. Spleen: Normal in size without focal abnormality. Adrenals/Urinary Tract: No adrenal  masses. Kidneys show symmetric enhancement. 1.4 cm low-attenuation left mid pole cortical mass consistent with a cyst, stable. No follow-up recommended no renal stones. No hydronephrosis. Ureters are normal in course and in caliber. Bladder mildly distended. Wall is mildly thickened and irregular with subtle adjacent inflammatory haziness. No bladder mass or stone. . Stomach/Bowel: Normal stomach. Small bowel and colon are normal in caliber. No wall thickening. No inflammation.  Vascular/Lymphatic: Aortic atherosclerosis. No aneurysm. No enlarged lymph nodes. Reproductive: Prominent postmenopausal uterus. 1.6 cm fibroid. No adnexal masses. Other: No abdominal wall hernia or abnormality. No abdominopelvic ascites. Musculoskeletal: No fracture or acute finding.  No bone lesion. IMPRESSION: 1. Bladder with wall thickening and irregularity and subtle adjacent inflammation consistent with cystitis. 2. No other acute abnormality within the abdomen or pelvis. 3. Aortic atherosclerosis. Electronically Signed   By: Lajean Manes M.D.   On: 12/20/2021 10:05   DG Chest Port 1 View  Result Date: 12/20/2021 CLINICAL DATA:  Chest pain and SOB. Elevated heart rate. B-cell lymphoma. EXAM: PORTABLE CHEST 1 VIEW COMPARISON:  11/25/2021 FINDINGS: There is a right chest wall port a catheter with tip at the cavoatrial junction. Heart size and mediastinal contours are unremarkable. There is no pleural effusion or edema identified. No airspace opacities. IMPRESSION: No active disease. Electronically Signed   By: Kerby Moors M.D.   On: 12/20/2021 07:43    Assessment and Plan  HPI: Stacey Stone is a 76 y.o. female with medical history significant for atrial fibrillation on chronic anticoagulation therapy with Eliquis, history of breast cancer status post radiation therapy and recently completed a 5-year course of letrozole, coronary artery disease, stage III diffuse large B-cell lymphoma currently on chemotherapy with last treatment on 12/17/21, history of recurrent UTI on daily prophylactic therapy with Levaquin, history of hypotension who presents to the ER by EMS for evaluation of bilateral jaw pain with radiation to her left shoulder and left anterior chest wall which started about 5 AM on the day of admission.  Per patient she usually feels this way when her heart rate goes up.  She called EMS and was noted to be tachycardic with heart rates in the 200's.  Chest x-ray reviewed by me shows  no evidence of active cardiopulmonary disease CT scan of abdomen and pelvis shows bladder with wall thickening and irregularity and subtle adjacent inflammation consistent with cystitis. No other acute abnormality within the abdomen or pelvis. Aortic atherosclerosis.  A fib with RVR -- patient has known history of atrial fibrillation on chronic anticoagulation -- heart rate much improved after IV amiodarone drip. Seen by Dr. Neoma Laming -- Patient's primary cardiologist is Dr. Saralyn Pilar-- patient currently in sinus rhythm. Will switch her back to sotalol and resume eliquis. Discontinue heparin drip. -- Patient will follow-up cardiology as outpatient  Diffuse large B-cell lymphoma -- patient follows with Dr. Grayland Ormond -- last chemotherapy was on 29th of September -- monitor accounts  Sepsis -- as evident by hypotension that responded to IV fluids, tachycardia tachypnea and leukocytosis-- unclear source -- Pro calcitonin elevated --Patient has some cough. Chest x-ray negative for pneumonia p-- empirically on IV meropenem -- urine culture pending -blood-culture negative -- no fever -- continue to monitor and de-escalate antibiotics  Elevated troponin -- suspected due to demand ischemia from rapid a fib. -- Patient denies any chest pain. -- Has history of CAD--Status post left heart cath in 2019 which showed one-vessel coronary artery disease with occluded proximal RCA with ipsi- and contra- collaterals. Patent  stent in first diagonal branch. Normal left ventricular function.  Hypothyroidism -- continue Synthroid  Depression -- continue Zoloft  Thrombocytopenia -- likely due to chemo continue to monitor -- patient currently is back on her eliquis  History of breast cancer Status post radiation therapy and has completed a 5-year course of letrozole. Follow-up with oncology as an outpatient.   Diabetes mellitus without complication ([place]) Appears to be diet controlled Check  blood sugars AC meals Maintain consistent carbohydrate diet  Family communication :dter at bedside Consults : cardiology, oncology CODE STATUS: full DVT Prophylaxis :eliquis Level of care: Telemetry Medical Status is: Inpatient Remains inpatient appropriate because: Afib rvr, ?sepsis    TOTAL TIME TAKING CARE OF THIS PATIENT: 35 minutes.  >50% time spent on counselling and coordination of care  Note: This dictation was prepared with Dragon dictation along with smaller phrase technology. Any transcriptional errors that result from this process are unintentional.  Fritzi Mandes M.D    [facility]   CC: Primary care physician; Rusty Aus, MD

## 2021-12-21 NOTE — Consult Note (Signed)
ANTICOAGULATION CONSULT NOTE - Initial Consult  Pharmacy Consult for heparin infusion Indication: chest pain/ACS  Allergies  Allergen Reactions   Codeine Other (See Comments)    Stroke like symptoms   Tramadol Itching    Patient Measurements: Height: '5\' 2"'$  (157.5 cm) Weight: 71.2 kg (157 lb) IBW/kg (Calculated) : 50.1 Heparin Dosing Weight: 65.2 kg  Vital Signs: Temp: 98.8 F (37.1 C) (10/01 2200) Temp Source: Oral (10/01 1438) BP: 111/72 (10/02 0100) Pulse Rate: 59 (10/02 0115)  Labs: Recent Labs    12/20/21 0706 12/20/21 0817 12/20/21 1004 12/20/21 1311 12/20/21 1650 12/21/21 0048  HGB 8.3*  --   --   --   --   --   HCT 26.2*  --   --   --   --   --   PLT 60*  --   --   --   --   --   APTT  --   --  26  --   --  60*  LABPROT 16.1*  --   --   --   --  15.4*  INR 1.3*  --   --   --   --  1.2  HEPARINUNFRC  --   --  >1.10*  --   --  0.87*  CREATININE  --  0.82  --   --   --   --   TROPONINIHS 135*  --  237* 518* 447*  --      Estimated Creatinine Clearance: 53.1 mL/min (by C-G formula based on SCr of 0.82 mg/dL).   Medical History: Past Medical History:  Diagnosis Date   Abdominal pain, left lower quadrant    epiploic appendagitis   Anginal pain ([place])    Arthritis 06/06/2018   knee   Asthma    mild   Atrial fibrillation ([place])    Breast cancer ([place])    Coronary artery disease 06/06/2018   1 artery 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 Mamie Nick. 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   No pertinent past medical history    Pancytopenia ([place])    Personal history of radiation therapy    Sleep apnea 06/06/2018   O2- 2l at bedtime and BiPap @ bedtime    Medications:  Scheduled:    atorvastatin  40 mg Oral QHS   cyanocobalamin  1,000 mcg Oral Daily   ferrous sulfate  325 mg Oral Q breakfast   levothyroxine  125 mcg Oral Q0600   loratadine  10 mg Oral Daily   mometasone-formoterol  2 puff Inhalation BID   pantoprazole  40 mg Oral Daily   potassium chloride  40 mEq Oral BID   predniSONE  100 mg Oral Daily   sertraline  50 mg Oral QHS   Infusions:   amiodarone 30 mg/hr (12/21/21 0042)   heparin 750 Units/hr (12/20/21 1459)   lactated ringers 125 mL/hr at 12/20/21 1650   meropenem (MERREM) IV Stopped (12/20/21 1639)   PRN: acetaminophen, nitroGLYCERIN, ondansetron **OR** ondansetron (ZOFRAN) IV  Assessment: Stacey Stone is a 76 y.o. female presenting with chest pain and new mild cough and subjective fevers x3 days. PMH significant for  A-fib on Eliquis, B-cell lymphoma  on chemotherapy, CAD, HTN, DM, hypothyroidism. Patient was on [facility] PTA per chart review, last dose of Apixaban was 9/30 PM. Per admitting MD, will initiate heparin infusion for ACS without bolus. Of note, patient found to have baseline thrombocytopenia and was thrombocytopenic at presentation. Pharmacy has been consulted to initiate and manage heparin infusion.   Baseline Labs: aPTT 26, HL >1.10, PT 16.1, INR 1.3, Hgb 8.3, Hct 26.2, Plt 60   Goal of Therapy:  Heparin level 0.3-0.7 units/ml aPTT 66-102 seconds Monitor platelets by anticoagulation protocol: Yes   Plan:  10/2 @ 0048:  HL = 0.87, (elevated) aPTT = SUBtherapeutic  - HL still elevated from Eliquis PTA so will continue to use aPTT to guide dosing - Will order heparin 1000 units IV X 1 bolus and increase drip rate to 850 units/hr.  - Will recheck aPTT 8 hrs after rate change - Will recheck HL on 10/3 with AM labs  [name] 12/21/2021 1:33 AM

## 2021-12-22 ENCOUNTER — Ambulatory Visit: Payer: No Typology Code available for payment source

## 2021-12-22 ENCOUNTER — Inpatient Hospital Stay: Payer: No Typology Code available for payment source

## 2021-12-22 DIAGNOSIS — I4891 Unspecified atrial fibrillation: Secondary | ICD-10-CM | POA: Diagnosis not present

## 2021-12-22 LAB — BASIC METABOLIC PANEL
Anion gap: 3 — ABNORMAL LOW (ref 5–15)
BUN: 14 mg/dL (ref 8–23)
CO2: 25 mmol/L (ref 22–32)
Calcium: 8.4 mg/dL — ABNORMAL LOW (ref 8.9–10.3)
Chloride: 110 mmol/L (ref 98–111)
Creatinine, Ser: 0.55 mg/dL (ref 0.44–1.00)
GFR, Estimated: >60 mL/min (ref >60)
Glucose, Bld: 92 mg/dL (ref 70–99)
Potassium: 4 mmol/L (ref 3.5–5.1)
Sodium: 138 mmol/L (ref 135–145)

## 2021-12-22 LAB — GLUCOSE, CAPILLARY
Glucose-Capillary: 100 mg/dL — ABNORMAL HIGH (ref 70–99)
Glucose-Capillary: 97 mg/dL (ref 70–99)
Glucose-Capillary: 117 mg/dL — ABNORMAL HIGH (ref 70–99)

## 2021-12-22 LAB — MAGNESIUM: Magnesium: 2.1 mg/dL (ref 1.7–2.4)

## 2021-12-22 MED ORDER — ONDANSETRON HCL 4 MG PO TABS: 4.0000 mg | ORAL_TABLET | ORAL | Status: DC | PRN

## 2021-12-22 MED ORDER — GLUCERNA SHAKE PO LIQD
237.0000 mL | Freq: Three times a day (TID) | ORAL | Status: DC
  Administered 2021-12-22 – 2021-12-27 (×2): 237 mL via ORAL

## 2021-12-22 MED ORDER — LORAZEPAM 2 MG/ML IJ SOLN
0.5000 mg | Freq: Once | INTRAMUSCULAR | Status: AC
  Administered 2021-12-22: 0.5 mg via INTRAVENOUS
  Filled 2021-12-22: qty 1

## 2021-12-22 MED ORDER — ADULT MULTIVITAMIN W/MINERALS CH
1.0000 | ORAL_TABLET | Freq: Every day | ORAL | Status: DC
  Administered 2021-12-22 – 2021-12-29 (×7): 1 via ORAL
  Filled 2021-12-22 (×8): qty 1

## 2021-12-22 MED ORDER — ONDANSETRON HCL 4 MG/2ML IJ SOLN
4.0000 mg | Freq: Four times a day (QID) | INTRAMUSCULAR | Status: DC | PRN
  Administered 2021-12-23 – 2021-12-24 (×2): 4 mg via INTRAVENOUS
  Filled 2021-12-22 (×2): qty 2

## 2021-12-22 MED ORDER — LEVOFLOXACIN 500 MG PO TABS
500.0000 mg | ORAL_TABLET | Freq: Every day | ORAL | Status: DC
  Administered 2021-12-22: 500 mg via ORAL
  Filled 2021-12-22: qty 1

## 2021-12-22 NOTE — Progress Notes (Signed)
[facility] Cardiology  SUBJECTIVE: Patient laying in bed, denies chest pain or shortness of breath   Vitals:   12/21/21 1527 12/21/21 1949 12/21/21 2335 12/22/21 0855  BP: 120/67 128/84 133/72 (!) 142/100  Pulse: 75 67 65 76  Resp: '16 20 20   '$ Temp: 98.5 F (36.9 C) 98.2 F (36.8 C) 97.9 F (36.6 C) 98.9 F (37.2 C)  TempSrc:  Oral Oral Oral  SpO2: 99% 99% 97% 96%  Weight:      Height:         Intake/Output Summary (Last 24 hours) at 12/22/2021 0915 Last data filed at 12/22/2021 0856 Gross per 24 hour  Intake 161.62 ml  Output 1350 ml  Net -1188.38 ml      PHYSICAL EXAM  General: Well developed, well nourished, in no acute distress HEENT:  Normocephalic and atramatic Neck:  No JVD.  Lungs: Clear bilaterally to auscultation and percussion. Heart: HRRR . Normal S1 and S2 without gallops or murmurs.  Abdomen: Bowel sounds are positive, abdomen soft and non-tender  Msk:  Back normal, normal gait. Normal strength and tone for age. Extremities: No clubbing, cyanosis or edema.   Neuro: Alert and oriented X 3. Psych:  Good affect, responds appropriately   LABS: Basic Metabolic Panel: Recent Labs    12/21/21 0048 12/21/21 1638 12/22/21 0647  NA 137  --  138  K 4.7 4.6 4.0  CL 110  --  110  CO2 22  --  25  GLUCOSE 114*  --  92  BUN 18  --  14  CREATININE 0.61  --  0.55  CALCIUM 8.6*  --  8.4*  MG  --  2.4 2.1   Liver Function Tests: Recent Labs    12/20/21 0817  AST 111*  ALT 67*  ALKPHOS 165*  BILITOT 1.1  PROT 5.0*  ALBUMIN 2.4*   No results for input(s): "LIPASE", "AMYLASE" in the last 72 hours. CBC: Recent Labs    12/20/21 0706 12/21/21 0048  WBC 16.6* 14.2*  NEUTROABS 15.4*  --   HGB 8.3* 7.4*  HCT 26.2* 23.9*  MCV 97.4 99.2  PLT 60* 52*   Cardiac Enzymes: No results for input(s): "CKTOTAL", "CKMB", "CKMBINDEX", "TROPONINI" in the last 72 hours. BNP: Invalid input(s): "POCBNP" D-Dimer: No results for input(s): "DDIMER" in the last 72  hours. Hemoglobin A1C: No results for input(s): "HGBA1C" in the last 72 hours. Fasting Lipid Panel: No results for input(s): "CHOL", "HDL", "LDLCALC", "TRIG", "CHOLHDL", "LDLDIRECT" in the last 72 hours. Thyroid Function Tests: Recent Labs    12/20/21 0706  TSH 0.771   Anemia Panel: No results for input(s): "VITAMINB12", "FOLATE", "FERRITIN", "TIBC", "IRON", "RETICCTPCT" in the last 72 hours.  US Abdomen Limited RUQ (LIVER/GB)  Result Date: 12/20/2021 CLINICAL DATA:  Transaminitis EXAM: ULTRASOUND ABDOMEN LIMITED RIGHT UPPER QUADRANT COMPARISON:  CT abdomen pelvis, 12/20/2021 FINDINGS: Gallbladder: Status post cholecystectomy. Gallbladder fossa fluid described by prior CT is not appreciated by ultrasound. Common bile duct: Diameter: 0.7 cm Liver: No focal lesion identified. Increased parenchymal echogenicity. Portal vein is patent on color Doppler imaging with normal direction of blood flow towards the liver. Other: None. IMPRESSION: 1. Status post cholecystectomy. Gallbladder fossa fluid described by prior CT is not appreciated by ultrasound. 2. Hepatic steatosis. Electronically Signed   By: Delanna Ahmadi M.D.   On: 12/20/2021 14:01   CT Abdomen Pelvis W Contrast  Result Date: 12/20/2021 CLINICAL DATA:  Sepsis. EXAM: CT ABDOMEN AND PELVIS WITH CONTRAST TECHNIQUE: Multidetector CT imaging  of the abdomen and pelvis was performed using the standard protocol following bolus administration of intravenous contrast. RADIATION DOSE REDUCTION: This exam was performed according to the departmental dose-optimization program which includes automated exposure control, adjustment of the mA and/or kV according to patient size and/or use of iterative reconstruction technique. CONTRAST:  140m OMNIPAQUE IOHEXOL 300 MG/ML  SOLN COMPARISON:  01/06/2021. FINDINGS: Lower chest: No acute findings. Hepatobiliary: Normal liver. Small amount of fluid attenuation along the gallbladder fossa. Common bile duct measures 7  mm proximally with normal distal tapering. Pancreas: Unremarkable. No pancreatic ductal dilatation or surrounding inflammatory changes. Spleen: Normal in size without focal abnormality. Adrenals/Urinary Tract: No adrenal masses. Kidneys show symmetric enhancement. 1.4 cm low-attenuation left mid pole cortical mass consistent with a cyst, stable. No follow-up recommended no renal stones. No hydronephrosis. Ureters are normal in course and in caliber. Bladder mildly distended. Wall is mildly thickened and irregular with subtle adjacent inflammatory haziness. No bladder mass or stone. . Stomach/Bowel: Normal stomach. Small bowel and colon are normal in caliber. No wall thickening. No inflammation. Vascular/Lymphatic: Aortic atherosclerosis. No aneurysm. No enlarged lymph nodes. Reproductive: Prominent postmenopausal uterus. 1.6 cm fibroid. No adnexal masses. Other: No abdominal wall hernia or abnormality. No abdominopelvic ascites. Musculoskeletal: No fracture or acute finding.  No bone lesion. IMPRESSION: 1. Bladder with wall thickening and irregularity and subtle adjacent inflammation consistent with cystitis. 2. No other acute abnormality within the abdomen or pelvis. 3. Aortic atherosclerosis. Electronically Signed   By: DLajean ManesM.D.   On: 12/20/2021 10:05     Echo EF 60-65% 12/01/2021  TELEMETRY: Sinus rhythm 69 bpm:  ASSESSMENT AND PLAN:  Principal Problem:   Atrial fibrillation with rapid ventricular response (HCC) Active Problems:   Hypothyroidism   Sepsis (HCC)   DLBCL (diffuse large B cell lymphoma) (HCC)   Diabetes mellitus without complication (HCC)   History of breast cancer   Hypokalemia   Elevated troponin   Thrombocytopenia (HCC)   Transaminitis   Depression   UTI (urinary tract infection)    1.  Atrial fibrillation with rapid ventricular rate in the setting of sepsis, initially on amiodarone infusion, converted to sinus rhythm, back on sotalol for rate and rhythm control,  and Eliquis for stroke prevention 2.  Sepsis, elevated procalcitonin and white count, on broad-spectrum antibiotics 3.  Elevated troponin (37, 518, 447), in the absence of chest pain or ECG changes, likely demand supply ischemia 4.  Coronary artery disease, DES D1 09/24/2015, patent stent by cardiac catheterization 10/26/2017, currently without chest pain 5.  Diffuse large B-cell lymphoma, followed by Dr. FGrayland Ormond most recent chemotherapy 12/18/2021  Recommendations  1.  Agree with current therapy 2.  Continue Eliquis for stroke prevention 3.  Continue sotalol for rate and rhythm control 4.  Defer further cardiac diagnostics at this time    AIsaias Cowman MD, PhD, FChi St. [facility]10/05/2021 9:15 AM

## 2021-12-22 NOTE — Progress Notes (Addendum)
Initial Nutrition Assessment  DOCUMENTATION CODES:   Not applicable  INTERVENTION:   -Liberalize diet to carb modified -MVI with minerals daily -Glucerna Shake po TID, each supplement provides 220 kcal and 10 grams of protein   NUTRITION DIAGNOSIS:   Increased nutrient needs related to cancer and cancer related treatments as evidenced by estimated needs.  GOAL:   Patient will meet greater than or equal to 90% of their needs  MONITOR:   PO intake, Supplement acceptance  REASON FOR ASSESSMENT:   Malnutrition Screening Tool    ASSESSMENT:   Pt with medical history significant for atrial fibrillation, history of breast cancer status post radiation therapy, coronary artery disease, stage III diffuse large B-cell lymphoma currently on chemotherapy, recurrent UTI, hypotension who presents for evaluation of bilateral jaw pain with radiation to her left shoulder and left anterior chest wall  Pt admitted with a-fib with RVR.   Reviewed I/O's: -690 ml x 24 hours and +2.2 L since admission  UOP: 1.1 L x 24 hours  Spoke with pt at bedside, who was pleasant and in good spirits today. She reports that she slept well last night and consumed some breakfast. Pt reports poor appetite, which is common for her, as she has been undergoing chemo treatments since June (last chemo was 12/18/21). Per pt, she experiences poor appetite 2 weeks after treatments, but improves after the third week. Pt tries to consume 3 small meals and 2-3 snacks daily (Breakfast: peanut butter and jelly sandwich; Lunch: chicken salad or pimento cheese with crackers; Dinner: meat, starch, and vegetable; Snacks: yogurt or Glucerna shake mixed with ice cream). Pt lives with her son who has Down Syndrome and also has great family and friend support who assist with meals, errands, and appointments.   Per pt, her UBW is around 183#. She has lost 30# over the past 3 months. Reviewed wt hx; pt has experienced a 5.4% wt loss over  the past month, which is significant for time frame. Pt reports she is not as active as she used to be and has been working with home health PT.   Discussed importance of good meal and supplement intake to promote healing. Pt amenable to continue Glucerna.   Medications reviewed and include vitamin B-12 and ferrous sulfate.   Lab Results  Component Value Date   HGBA1C 5.6 11/08/2021   PTA DM medications are none.   Labs reviewed: CBGS: 100 (inpatient orders for glycemic control are none).    NUTRITION - FOCUSED PHYSICAL EXAM:  Flowsheet Row Most Recent Value  Orbital Region No depletion  Upper Arm Region No depletion  Thoracic and Lumbar Region No depletion  Buccal Region No depletion  Temple Region No depletion  Clavicle Bone Region No depletion  Clavicle and Acromion Bone Region No depletion  Scapular Bone Region No depletion  Dorsal Hand No depletion  Patellar Region No depletion  Anterior Thigh Region No depletion  Posterior Calf Region No depletion  Edema (RD Assessment) None  Hair Reviewed  Eyes Reviewed  Mouth Reviewed  Skin Reviewed  Nails Reviewed        Diet Order:   Diet Order             Diet heart healthy/carb modified Room service appropriate? Yes; Fluid consistency: Thin  Diet effective now                   EDUCATION NEEDS:   Education needs have been addressed  Skin:  Skin Assessment: Reviewed  RN Assessment  Last BM:  12/22/21 (type 5)  Height:   Ht Readings from Last 1 Encounters:  12/20/21 '5\' 2"'$  (1.575 m)    Weight:   Wt Readings from Last 1 Encounters:  12/20/21 71.2 kg    Ideal Body Weight:  50 kg  BMI:  Body mass index is 28.72 kg/m.  Estimated Nutritional Needs:   Kcal:  1500-1700  Protein:  75-90 grams  Fluid:  > 1.5 L    Loistine Chance, RD, LDN, [place] Registered Dietitian II Certified Diabetes Care and Education Specialist Please refer to [facility] for RD and/or RD on-call/weekend/after hours pager

## 2021-12-22 NOTE — Progress Notes (Signed)
Patient c/o of pink-streaked sputum when coughing.  Patient ambulated to bathroom and produced a quarter-sized red clot in sputum.  VS stable.  Dr. Posey Pronto notified.

## 2021-12-22 NOTE — Progress Notes (Signed)
Mobility Specialist - Progress Note   12/22/21 1145  Mobility  Activity Ambulated independently in hallway;Transferred from bed to chair  Activity Response Tolerated well  Distance Ambulated (ft) 80 ft  $Mobility charge 1 Mobility  Level of Assistance Modified independent, requires aide device or extra time  Assistive Device Front wheel walker  Mobility Referral Yes    Pre-mobility: 71 HR, 95% SpO2 During mobility: 88 HR, 96% SpO2 Post-mobility: 72 HR, 95% SpO2   Pt lying in bed upon arrival, utilizing RA. Pt agreeable to activity. Completed bed mobility, STS, and ambulation modI. Max HR 88 bpm. No complaints but mildly fatigued with activity. Voiced pain in lower R abdomen at end of session. Pt left in recliner with needs in reach, brother at bedside.    Kathee Delton Mobility Specialist 12/22/21, 11:47 AM

## 2021-12-22 NOTE — Progress Notes (Signed)
[facility] [place] NAME: Stacey Stone    MR#:  [id number]  DATE OF BIRTH:  [birth year]  SUBJECTIVE:  Came in after having jaw pain and increasing heart. Found to be in a fib with RVR was placed on Cardizem thereafter amiodarone now switch to oral sotalol. Back on PO eliquis. Continues with some cough with productive yellowish to whitish phlegm. Denies any burning urine no fever.    Ate BF. Mild nausea. No fever. Denies any pain or sob        VITALS:  Blood pressure (!) 142/100, pulse 76, temperature 98.9 F (37.2 C), temperature source Oral, resp. rate 20, height '5\' 2"'$  (1.575 m), weight 71.2 kg, SpO2 96 %.  PHYSICAL EXAMINATION:   GENERAL:  76 y.o.-year-old patient lying in the bed with no acute distress.alopecia  LUNGS: Normal breath sounds bilaterally, no wheezing, rales, rhonchi.  CARDIOVASCULAR: S1, S2 normal. No murmurs, rubs, or gallops.  ABDOMEN: Soft, nontender, nondistended. Bowel sounds present.  EXTREMITIES: No  edema b/l.    NEUROLOGIC: nonfocal  patient is alert and awake SKIN: No obvious rash, lesion, or ulcer.   LABORATORY PANEL:  CBC Recent Labs  Lab 12/21/21 0048  WBC 14.2*  HGB 7.4*  HCT 23.9*  PLT 52*     Chemistries  Recent Labs  Lab 12/20/21 0817 12/20/21 1004 12/22/21 0647  NA 138   < > 138  K 3.3*   < > 4.0  CL 112*   < > 110  CO2 22   < > 25  GLUCOSE 98   < > 92  BUN 21   < > 14  CREATININE 0.82   < > 0.55  CALCIUM 8.5*   < > 8.4*  MG  --    < > 2.1  AST 111*  --   --   ALT 67*  --   --   ALKPHOS 165*  --   --   BILITOT 1.1  --   --    < > = values in this interval not displayed.    Cardiac Enzymes No results for input(s): "TROPONINI" in the last 168 hours. RADIOLOGY:  US Abdomen Limited RUQ (LIVER/GB)  Result Date: 12/20/2021 CLINICAL DATA:  Transaminitis EXAM: ULTRASOUND ABDOMEN LIMITED RIGHT UPPER QUADRANT COMPARISON:  CT abdomen pelvis, 12/20/2021 FINDINGS: Gallbladder:  Status post cholecystectomy. Gallbladder fossa fluid described by prior CT is not appreciated by ultrasound. Common bile duct: Diameter: 0.7 cm Liver: No focal lesion identified. Increased parenchymal echogenicity. Portal vein is patent on color Doppler imaging with normal direction of blood flow towards the liver. Other: None. IMPRESSION: 1. Status post cholecystectomy. Gallbladder fossa fluid described by prior CT is not appreciated by ultrasound. 2. Hepatic steatosis. Electronically Signed   By: Delanna Ahmadi M.D.   On: 12/20/2021 14:01    Assessment and Plan  HPI: Stacey Stone is a 76 y.o. female with medical history significant for atrial fibrillation on chronic anticoagulation therapy with Eliquis, history of breast cancer status post radiation therapy and recently completed a 5-year course of letrozole, coronary artery disease, stage III diffuse large B-cell lymphoma currently on chemotherapy with last treatment on 12/17/21, history of recurrent UTI on daily prophylactic therapy with Levaquin, history of hypotension who presents to the ER by EMS for evaluation of bilateral jaw pain with radiation to her left shoulder and left anterior chest wall which started about 5 AM on the day of admission.  Per patient  she usually feels this way when her heart rate goes up.  She called EMS and was noted to be tachycardic with heart rates in the 200's.  Chest x-ray reviewed by me shows no evidence of active cardiopulmonary disease CT scan of abdomen and pelvis shows bladder with wall thickening and irregularity and subtle adjacent inflammation consistent with cystitis. No other acute abnormality within the abdomen or pelvis. Aortic atherosclerosis.  A fib with RVR -- patient has known history of atrial fibrillation on chronic anticoagulation -- heart rate much improved after IV amiodarone drip. Seen by Dr. Neoma Laming -- Patient's primary cardiologist is Dr. Saralyn Pilar-- patient currently in sinus  rhythm. Will switch her back to sotalol and resume eliquis. Discontinue heparin drip. -- Patient will follow-up cardiology as outpatient  Diffuse large B-cell lymphoma -- patient follows with Dr. Grayland Ormond -- last chemotherapy was on 29th of September -- monitor accounts  Sepsis -- as evident by hypotension that responded to IV fluids, tachycardia tachypnea and leukocytosis-- unclear source -- Pro calcitonin elevated --Patient has some cough. Chest x-ray negative for pneumonia p-- empirically on IV meropenem -- urine culture pending--will de-escalate abx once UC is back -blood-culture negative -- no fever -- continue to monitor and de-escalate antibiotics --sepsis resolved  Elevated troponin -- suspected due to demand ischemia from rapid a fib. -- Patient denies any chest pain. -- Has history of CAD--Status post left heart cath in 2019 which showed one-vessel coronary artery disease with occluded proximal RCA with ipsi- and contra- collaterals. Patent stent in first diagonal branch. Normal left ventricular function. --pt remains in NSR  Hypothyroidism -- continue Synthroid  Depression -- continue Zoloft  Thrombocytopenia -- likely due to chemo  -- patient currently is back on her eliquis  History of breast cancer Status post radiation therapy and has completed a 5-year course of letrozole. Follow-up with oncology as an outpatient.   Diabetes mellitus without complication ([place]) Appears to be diet controlled Check blood sugars AC meals Maintain consistent carbohydrate diet  Family communication none today Consults : cardiology, oncology CODE STATUS: full DVT Prophylaxis :eliquis Level of care: Telemetry Medical Status is: Inpatient Remains inpatient appropriate because: Afib rvr, ?sepsis   Ambulate today. Await UC.  EDD 10/04  TOTAL TIME TAKING CARE OF THIS PATIENT: 35 minutes.  >50% time spent on counselling and coordination of care  Note: This dictation was  prepared with Dragon dictation along with smaller phrase technology. Any transcriptional errors that result from this process are unintentional.  Fritzi Mandes M.D    [facility]   CC: Primary care physician; Rusty Aus, MD

## 2021-12-22 NOTE — Progress Notes (Signed)
       CROSS COVER NOTE  NAME: APPHIA CROPLEY MRN: [id number] DOB : Nov 04, 1945    Date of Service   12/22/2021   HPI/Events of Note   Temp 101.104F Abx deescalated today  Interventions   Assessment/Plan:  Fever, immunocompromised Acetaminophen PRN Re-culture  Nausea Lorazepam 0.5 mg      This document was prepared using Dragon voice recognition software and may include unintentional dictation errors.  Neomia Glass DNP, MHA, FNP-BC Nurse Practitioner [facility] Pager [phone]

## 2021-12-22 NOTE — Progress Notes (Signed)
Patient c/o of nausea and dry heaving.  Patient given zofran IV.  MD notified of patient having consistent nausea despite zofran administration.

## 2021-12-23 DIAGNOSIS — I4891 Unspecified atrial fibrillation: Secondary | ICD-10-CM | POA: Diagnosis not present

## 2021-12-23 LAB — BASIC METABOLIC PANEL
Anion gap: 3 — ABNORMAL LOW (ref 5–15)
BUN: 10 mg/dL (ref 8–23)
CO2: 27 mmol/L (ref 22–32)
Calcium: 8.2 mg/dL — ABNORMAL LOW (ref 8.9–10.3)
Chloride: 103 mmol/L (ref 98–111)
Creatinine, Ser: 0.57 mg/dL (ref 0.44–1.00)
GFR, Estimated: >60 mL/min (ref >60)
Glucose, Bld: 92 mg/dL (ref 70–99)
Potassium: 3.7 mmol/L (ref 3.5–5.1)
Sodium: 133 mmol/L — ABNORMAL LOW (ref 135–145)

## 2021-12-23 LAB — GLUCOSE, CAPILLARY
Glucose-Capillary: 84 mg/dL (ref 70–99)
Glucose-Capillary: 112 mg/dL — ABNORMAL HIGH (ref 70–99)
Glucose-Capillary: 84 mg/dL (ref 70–99)
Glucose-Capillary: 102 mg/dL — ABNORMAL HIGH (ref 70–99)

## 2021-12-23 LAB — CBC
HCT: 23 % — ABNORMAL LOW (ref 36.0–46.0)
Hemoglobin: 7.3 g/dL — ABNORMAL LOW (ref 12.0–15.0)
MCH: 31.1 pg (ref 26.0–34.0)
MCHC: 31.7 g/dL (ref 30.0–36.0)
MCV: 97.9 fL (ref 80.0–100.0)
Platelets: 27 10*3/uL — CL (ref 150–400)
RBC: 2.35 MIL/uL — ABNORMAL LOW (ref 3.87–5.11)
RDW: 21.8 % — ABNORMAL HIGH (ref 11.5–15.5)
WBC: 2.6 10*3/uL — ABNORMAL LOW (ref 4.0–10.5)
nRBC: 0 % (ref 0.0–0.2)

## 2021-12-23 LAB — URINE CULTURE: Culture: 60000 — AB

## 2021-12-23 LAB — MAGNESIUM: Magnesium: 1.9 mg/dL (ref 1.7–2.4)

## 2021-12-23 MED ORDER — METOCLOPRAMIDE HCL 10 MG PO TABS: 5.0000 mg | ORAL_TABLET | Freq: Three times a day (TID) | ORAL | Status: DC | PRN

## 2021-12-23 MED ORDER — METOCLOPRAMIDE HCL 10 MG PO TABS
5.0000 mg | ORAL_TABLET | Freq: Three times a day (TID) | ORAL | Status: DC
  Administered 2021-12-23 – 2021-12-29 (×17): 5 mg via ORAL
  Filled 2021-12-23 (×17): qty 1

## 2021-12-23 MED ORDER — SULFAMETHOXAZOLE-TRIMETHOPRIM 800-160 MG PO TABS
1.0000 | ORAL_TABLET | Freq: Two times a day (BID) | ORAL | Status: AC
  Administered 2021-12-23 – 2021-12-27 (×10): 1 via ORAL
  Filled 2021-12-23 (×10): qty 1

## 2021-12-23 NOTE — Progress Notes (Signed)
Date and time results received: 12/23/2021 0930   Test: Platelet  Critical Value: 27  Name of Provider Notified: Dr. Fritzi Mandes   Orders Received? Or Actions Taken?: No new orders received

## 2021-12-23 NOTE — Progress Notes (Signed)
[facility] Cardiology   [place] CARDIOLOGY CONSULT NOTE       Patient ID: Stacey Stone MRN: [id number] DOB/AGE: 04/17/1945 76 y.o.  Admit date: 12/20/2021 Referring Physician Dr. Francine Graven Primary Physician Dr. Emily Filbert Primary Cardiologist Dr. Saralyn Pilar Reason for Consultation atrial fibrillation with RVR  HPI: Stacey Stone is a 76 year old female with a past medical history of paroxysmal atrial fibrillation on sotalol and Eliquis, history of breast cancer s/p XRT and letrozole x5 years, stage III diffuse large B-cell lymphoma currently on chemotherapy, history of recurrent UTIs on prophylactic Levaquin who presented to [facility] ED 12/20/2021 with jaw pain that radiated to her left shoulder and chest wall that usually occurs when her heart rate goes up, she was tachycardic to the 200s per EMS.  Cardiology is consulted for assistance with her atrial fibrillation.  Interval history: -Febrile and nauseous overnight, still nauseated this morning -Fortunately remains in sinus rhythm, rate controlled in the 70s to 80s -Still coughing with occasional dark sputum, no chest pain, shortness of breath, palpitations.  Vitals:   12/22/21 2123 12/23/21 0057 12/23/21 0540 12/23/21 0754  BP: (!) 147/81 132/67 (!) 149/83 (!) 146/80  Pulse: 84 66 78 83  Resp:  20  18  Temp: (!) 101.4 F (38.6 C) 98.9 F (37.2 C) 99.8 F (37.7 C) (!) 100.4 F (38 C)  TempSrc: Oral Oral Oral Oral  SpO2: 95% 93% 92% 90%  Weight:      Height:         Intake/Output Summary (Last 24 hours) at 12/23/2021 1143 Last data filed at 12/23/2021 1034 Gross per 24 hour  Intake 396.83 ml  Output 2200 ml  Net -1803.17 ml       PHYSICAL EXAM General: Ill-appearing Caucasian female,  in no acute distress.  Sitting upright in hospital bed with daughter at bedside HEENT:  Normocephalic and atraumatic. Neck:   No JVD.  Lungs: Normal respiratory effort on room air.  Trace crackles right base.   Heart: HRRR .  Normal S1 and S2 without gallops or murmurs.  Rate radial pulse 2+ . Abdomen: non-distended appearing.  Msk: Normal strength and tone for age. Extremities: No clubbing, cyanosis, edema.  Neuro: Alert and oriented x3 Psych:  mood appropriate for situation.     LABS: Basic Metabolic Panel: Recent Labs    12/21/21 1638 12/22/21 0647 12/23/21 0027  NA  --  138 133*  K 4.6 4.0 3.7  CL  --  110 103  CO2  --  25 27  GLUCOSE  --  92 92  BUN  --  14 10  CREATININE  --  0.55 0.57  CALCIUM  --  8.4* 8.2*  MG 2.4 2.1  --     Liver Function Tests: No results for input(s): "AST", "ALT", "ALKPHOS", "BILITOT", "PROT", "ALBUMIN" in the last 72 hours.  No results for input(s): "LIPASE", "AMYLASE" in the last 72 hours. CBC: Recent Labs    12/21/21 0048 12/23/21 0027  WBC 14.2* 2.6*  HGB 7.4* 7.3*  HCT 23.9* 23.0*  MCV 99.2 97.9  PLT 52* 27*    Cardiac Enzymes: No results for input(s): "CKTOTAL", "CKMB", "CKMBINDEX", "TROPONINI" in the last 72 hours. BNP: Invalid input(s): "POCBNP" D-Dimer: No results for input(s): "DDIMER" in the last 72 hours. Hemoglobin A1C: No results for input(s): "HGBA1C" in the last 72 hours. Fasting Lipid Panel: No results for input(s): "CHOL", "HDL", "LDLCALC", "TRIG", "CHOLHDL", "LDLDIRECT" in the last 72 hours. Thyroid Function Tests: No results for  input(s): "TSH", "T4TOTAL", "T3FREE", "THYROIDAB" in the last 72 hours.  Invalid input(s): "FREET3"  Anemia Panel: No results for input(s): "VITAMINB12", "FOLATE", "FERRITIN", "TIBC", "IRON", "RETICCTPCT" in the last 72 hours.  No results found.   Echo EF 60-65% 12/01/2021  TELEMETRY: Sinus rhythm 70s to 80s bpm:  Data reviewed by me: Hospitalist progress note, nursing notes, CBC, BMP, telemetry, cross cover progress note, procalcitonin, troponins  ASSESSMENT AND PLAN:  Principal Problem:   Atrial fibrillation with rapid ventricular response ([place]) Active Problems:   Hypothyroidism   Sepsis  ([place])   DLBCL (diffuse large B cell lymphoma) ([place])   Diabetes mellitus without complication ([place])   History of breast cancer   Hypokalemia   Elevated troponin   Thrombocytopenia ([facility])   Transaminitis   Depression   UTI (urinary tract infection)    1.  Atrial fibrillation with rapid ventricular rate in the setting of sepsis, initially on amiodarone infusion, converted to sinus rhythm, back on sotalol for rate and rhythm control, and Eliquis for stroke prevention 2.  Sepsis, elevated procalcitonin and white count, on broad-spectrum antibiotics 3.  Elevated troponin (37, 518, 447), in the absence of chest pain or ECG changes, likely demand supply ischemia 4.  Coronary artery disease, DES D1 09/24/2015, patent stent by cardiac catheterization 10/26/2017, currently without chest pain 5.  Diffuse large B-cell lymphoma, followed by Dr. Grayland Ormond, most recent chemotherapy 12/18/2021  Recommendations  1.  Agree with current therapy 2.  Continue Eliquis for stroke prevention 3.  Continue sotalol for rate and rhythm control 4.  Monitor and replete electrolytes to magnesium >2, potassium >4 4.  Defer further cardiac diagnostics at this time  This patient's plan of care was discussed and created with Dr. Saralyn Pilar and he is in agreement.    Tristan Schroeder, PA-C 12/23/2021 11:43 AM

## 2021-12-23 NOTE — Progress Notes (Signed)
Patient active with [facility] for home health PT.   TOC will follow for additional needs.   [place], [place]

## 2021-12-23 NOTE — Progress Notes (Signed)
[facility] at [place] NAME: Stacey Stone    MR#:  [id number]  DATE OF BIRTH:  04/19/1945  SUBJECTIVE:  patient now in sinus rhythm. Daughter at bedside today. Has been having significant issues with nausea and poor PO intake. Both daughter and patient cell-mediated want the lifecycle of chemo. It is drain her out. Received IV Ativan last night for nausea. VITALS:  Blood pressure 116/77, pulse 76, temperature 99.6 F (37.6 C), temperature source Oral, resp. rate (!) 28, height '5\' 2"'$  (1.575 m), weight 71.2 kg, SpO2 94 %.  PHYSICAL EXAMINATION:   GENERAL:  76 y.o.-year-old patient lying in the bed with no acute distress.alopecia ,weak LUNGS: Normal breath sounds bilaterally, no wheezing, rales, rhonchi.  CARDIOVASCULAR: S1, S2 normal. No murmurs, rubs, or gallops.  ABDOMEN: Soft, nontender, nondistended. Bowel sounds present.  EXTREMITIES: No  edema b/l.    NEUROLOGIC: nonfocal  patient is alert and awake SKIN: No obvious rash, lesion, or ulcer.   LABORATORY PANEL:  CBC Recent Labs  Lab 12/23/21 0027  WBC 2.6*  HGB 7.3*  HCT 23.0*  PLT 27*     Chemistries  Recent Labs  Lab 12/20/21 0817 12/20/21 1004 12/23/21 0027 12/23/21 0751  NA 138   < > 133*  --   K 3.3*   < > 3.7  --   CL 112*   < > 103  --   CO2 22   < > 27  --   GLUCOSE 98   < > 92  --   BUN 21   < > 10  --   CREATININE 0.82   < > 0.57  --   CALCIUM 8.5*   < > 8.2*  --   MG  --    < >  --  1.9  AST 111*  --   --   --   ALT 67*  --   --   --   ALKPHOS 165*  --   --   --   BILITOT 1.1  --   --   --    < > = values in this interval not displayed.    Cardiac Enzymes No results for input(s): "TROPONINI" in the last 168 hours. RADIOLOGY:  No results found.  Assessment and Plan  HPI: Stacey Stone is a 76 y.o. female with medical history significant for atrial fibrillation on chronic anticoagulation therapy with Eliquis, history of breast cancer  status post radiation therapy and recently completed a 5-year course of letrozole, coronary artery disease, stage III diffuse large B-cell lymphoma currently on chemotherapy with last treatment on 12/17/21, history of recurrent UTI on daily prophylactic therapy with Levaquin, history of hypotension who presents to the ER by EMS for evaluation of bilateral jaw pain with radiation to her left shoulder and left anterior chest wall which started about 5 AM on the day of admission.  Per patient she usually feels this way when her heart rate goes up.  She called EMS and was noted to be tachycardic with heart rates in the 200's.  Chest x-ray reviewed by me shows no evidence of active cardiopulmonary disease CT scan of abdomen and pelvis shows bladder with wall thickening and irregularity and subtle adjacent inflammation consistent with cystitis. No other acute abnormality within the abdomen or pelvis. Aortic atherosclerosis.  A fib with RVR -- patient has known history of atrial fibrillation on chronic anticoagulation -- heart rate much improved after IV amiodarone  drip. Seen by Dr. Neoma Laming -- Patient's primary cardiologist is Dr. Saralyn Pilar-- patient currently in sinus rhythm. Will switch her back to sotalol and resume eliquis. Discontinue heparin drip. -- Patient will follow-up cardiology as outpatient --10/4--holding eliquis due to plt count <50K  Intractable nausea -- patient receiving schedule Reglan and PRN Zofran. -- Suspected due to chemo effect. -- Encourage to eat small frequent meals   Diffuse large B-cell lymphoma -- patient follows with Dr. Grayland Ormond -- last cycle chemotherapy was on 29th of September -- monitor accounts -- both daughter and patient did not want any further cycles of chemo.  Sepsis -- as evident by hypotension that responded to IV fluids, tachycardia tachypnea and leukocytosis-- unclear source -- Pro calcitonin elevated --Patient has some cough. Chest x-ray  negative for pneumonia p-- empirically on IV meropenem -- urine culture ecoli -blood-culture negative -- continue to monitor and de-escalate antibiotic to po bactrim per c/s --sepsis resolved  Elevated troponin -- suspected due to demand ischemia from rapid a fib. -- Patient denies any chest pain. -- Has history of CAD--Status post left heart cath in 2019 which showed one-vessel coronary artery disease with occluded proximal RCA with ipsi- and contra- collaterals. Patent stent in first diagonal branch. Normal left ventricular function. --pt remains in NSR  Hypothyroidism -- continue Synthroid  Depression -- continue Zoloft  Thrombocytopenia -- likely due to chemo  -- plt <50 K--hold eliquis per Dr Grayland Ormond  History of breast cancer Status post radiation therapy and has completed a 5-year course of letrozole. Follow-up with oncology as an outpatient.   Diabetes mellitus without complication ([place]) Appears to be diet controlled Check blood sugars AC meals Maintain consistent carbohydrate diet  Family communication none today Consults : cardiology, oncology CODE STATUS: full DVT Prophylaxis :eliquis Level of care: Telemetry Medical Status is: Inpatient Remains inpatient appropriate because: Afib rvr, ?sepsis   Ambulate today. Await UC.  EDD 10/04  TOTAL TIME TAKING CARE OF THIS PATIENT: 35 minutes.  >50% time spent on counselling and coordination of care  Note: This dictation was prepared with Dragon dictation along with smaller phrase technology. Any transcriptional errors that result from this process are unintentional.  Fritzi Mandes M.D    [facility]   CC: Primary care physician; Rusty Aus, MD

## 2021-12-24 ENCOUNTER — Inpatient Hospital Stay: Payer: No Typology Code available for payment source

## 2021-12-24 DIAGNOSIS — I4891 Unspecified atrial fibrillation: Secondary | ICD-10-CM | POA: Diagnosis not present

## 2021-12-24 LAB — CBC
HCT: 23 % — ABNORMAL LOW (ref 36.0–46.0)
Hemoglobin: 7.8 g/dL — ABNORMAL LOW (ref 12.0–15.0)
MCH: 31 pg (ref 26.0–34.0)
MCHC: 33.9 g/dL (ref 30.0–36.0)
MCV: 91.3 fL (ref 80.0–100.0)
Platelets: 30 10*3/uL — ABNORMAL LOW (ref 150–400)
RBC: 2.52 MIL/uL — ABNORMAL LOW (ref 3.87–5.11)
RDW: 20.4 % — ABNORMAL HIGH (ref 11.5–15.5)
WBC: 0.3 10*3/uL — CL (ref 4.0–10.5)
nRBC: 0 % (ref 0.0–0.2)

## 2021-12-24 LAB — BASIC METABOLIC PANEL
Anion gap: 8 (ref 5–15)
BUN: 9 mg/dL (ref 8–23)
CO2: 26 mmol/L (ref 22–32)
Calcium: 8.1 mg/dL — ABNORMAL LOW (ref 8.9–10.3)
Chloride: 96 mmol/L — ABNORMAL LOW (ref 98–111)
Creatinine, Ser: 0.52 mg/dL (ref 0.44–1.00)
GFR, Estimated: >60 mL/min (ref >60)
Glucose, Bld: 91 mg/dL (ref 70–99)
Potassium: 3.4 mmol/L — ABNORMAL LOW (ref 3.5–5.1)
Sodium: 130 mmol/L — ABNORMAL LOW (ref 135–145)

## 2021-12-24 LAB — GLUCOSE, CAPILLARY
Glucose-Capillary: 90 mg/dL (ref 70–99)
Glucose-Capillary: 99 mg/dL (ref 70–99)
Glucose-Capillary: 105 mg/dL — ABNORMAL HIGH (ref 70–99)
Glucose-Capillary: 109 mg/dL — ABNORMAL HIGH (ref 70–99)

## 2021-12-24 LAB — MAGNESIUM: Magnesium: 1.8 mg/dL (ref 1.7–2.4)

## 2021-12-24 MED ORDER — MAGNESIUM OXIDE -MG SUPPLEMENT 400 (240 MG) MG PO TABS
400.0000 mg | ORAL_TABLET | Freq: Two times a day (BID) | ORAL | Status: DC
  Administered 2021-12-24 – 2021-12-29 (×11): 400 mg via ORAL
  Filled 2021-12-24 (×11): qty 1

## 2021-12-24 MED ORDER — MELATONIN 5 MG PO TABS
5.0000 mg | ORAL_TABLET | Freq: Every day | ORAL | Status: DC
  Administered 2021-12-24 – 2021-12-28 (×5): 5 mg via ORAL
  Filled 2021-12-24 (×5): qty 1

## 2021-12-24 MED ORDER — POTASSIUM CHLORIDE CRYS ER 20 MEQ PO TBCR
40.0000 meq | EXTENDED_RELEASE_TABLET | Freq: Two times a day (BID) | ORAL | Status: AC
  Administered 2021-12-24 (×2): 40 meq via ORAL
  Filled 2021-12-24 (×2): qty 2

## 2021-12-24 NOTE — Progress Notes (Signed)
[place] at [place] NAME: Stacey Stone    MR#:  [id number]  DATE OF BIRTH:  [birth year]  SUBJECTIVE:  patient now in sinus rhythm. Daughter at bedside today Nausea improving Eating a bit better [facility] some with mobility therapist  VITALS:  Blood pressure 91/73, pulse 73, temperature 98.1 F (36.7 C), resp. rate 18, height '5\' 2"'$  (1.575 m), weight 71.2 kg, SpO2 100 %.  PHYSICAL EXAMINATION:   GENERAL:  76 y.o.-year-old patient lying in the bed with no acute distress.alopecia ,weak LUNGS: Normal breath sounds bilaterally CARDIOVASCULAR: S1, S2 normal. No murmurs ABDOMEN: Soft, nontender, nondistended. Bowel sounds present.  EXTREMITIES: No  edema b/l.    NEUROLOGIC: nonfocal  patient is alert and awake SKIN: No obvious rash, lesion, or ulcer.   LABORATORY PANEL:  CBC Recent Labs  Lab 12/24/21 0627  WBC 0.3*  HGB 7.8*  HCT 23.0*  PLT 30*     Chemistries  Recent Labs  Lab 12/20/21 0817 12/20/21 1004 12/24/21 0627  NA 138   < > 130*  K 3.3*   < > 3.4*  CL 112*   < > 96*  CO2 22   < > 26  GLUCOSE 98   < > 91  BUN 21   < > 9  CREATININE 0.82   < > 0.52  CALCIUM 8.5*   < > 8.1*  MG  --    < > 1.8  AST 111*  --   --   ALT 67*  --   --   ALKPHOS 165*  --   --   BILITOT 1.1  --   --    < > = values in this interval not displayed.    Cardiac Enzymes No results for input(s): "TROPONINI" in the last 168 hours. RADIOLOGY:  No results found.  Assessment and Plan  HPI: Stacey Stone is a 76 y.o. female with medical history significant for atrial fibrillation on chronic anticoagulation therapy with Eliquis, history of breast cancer status post radiation therapy and recently completed a 5-year course of letrozole, coronary artery disease, stage III diffuse large B-cell lymphoma currently on chemotherapy with last treatment on 12/17/21, history of recurrent UTI on daily prophylactic therapy with Levaquin, history  of hypotension who presents to the ER by EMS for evaluation of bilateral jaw pain with radiation to her left shoulder and left anterior chest wall which started about 5 AM on the day of admission.  Per patient she usually feels this way when her heart rate goes up.  She called EMS and was noted to be tachycardic with heart rates in the 200's.  Chest x-ray reviewed by me shows no evidence of active cardiopulmonary disease CT scan of abdomen and pelvis shows bladder with wall thickening and irregularity and subtle adjacent inflammation consistent with cystitis. No other acute abnormality within the abdomen or pelvis. Aortic atherosclerosis.  A fib with RVR -- patient has known history of atrial fibrillation on chronic anticoagulation -- heart rate much improved after IV amiodarone drip. Seen by Dr. Neoma Laming -- Patient's primary cardiologist is Dr. Saralyn Pilar-- patient currently in sinus rhythm. Will switch her back to sotalol and resume eliquis. Discontinue heparin drip. -- Patient will follow-up cardiology as outpatient --10/4--holding eliquis due to plt count <50K  Intractable nausea -- patient receiving schedule Reglan and PRN Zofran. -- Suspected due to chemo effect. -- Encourage to eat small frequent meals --improving slowly --has been having BM  Diffuse large B-cell lymphoma -- patient follows with Dr. Grayland Ormond -- last cycle chemotherapy was on 29th of September -- monitor accounts -- both daughter and patient do not want any further cycles of chemo.  Sepsis -- as evident by hypotension that responded to IV fluids, tachycardia tachypnea and leukocytosis-- unclear source -- Pro calcitonin elevated --Patient has some cough. Chest x-ray negative for pneumonia p-- empirically on IV meropenem -- urine culture ecoli -blood-culture negative -- continue to monitor and de-escalate antibiotic to po bactrim per c/s --sepsis resolved  Elevated troponin -- suspected due to demand  ischemia from rapid a fib. -- Patient denies any chest pain. -- Has history of CAD--Status post left heart cath in 2019 which showed one-vessel coronary artery disease with occluded proximal RCA with ipsi- and contra- collaterals. Patent stent in first diagonal branch. Normal left ventricular function. --pt remains in NSR  Hypothyroidism -- continue Synthroid  Depression -- continue Zoloft  Thrombocytopenia -- likely due to chemo  -- plt <50 K--hold eliquis per Dr Grayland Ormond  History of breast cancer Status post radiation therapy and has completed a 5-year course of letrozole.   Diabetes mellitus without complication ([place]) Appears to be diet controlled Check blood sugars AC meals Maintain consistent carbohydrate diet  Family communication none today Consults : cardiology, oncology CODE STATUS: full DVT Prophylaxis :eliquis Level of care: Telemetry Medical Status is: Inpatient Remains inpatient appropriate because: improving nasuea. Will monitor 1 more day   Ambulate today. EDD 10/05  TOTAL TIME TAKING CARE OF THIS PATIENT: 35 minutes.  >50% time spent on counselling and coordination of care  Note: This dictation was prepared with Dragon dictation along with smaller phrase technology. Any transcriptional errors that result from this process are unintentional.  Fritzi Mandes M.D    [facility]   CC: Primary care physician; Rusty Aus, MD

## 2021-12-24 NOTE — Progress Notes (Addendum)
Mobility Specialist - Progress Note  Pre-mobility: HR, BP, SpO2 100%   12/24/21 1125  Mobility  Activity Ambulated independently in room  Activity Response Tolerated fair  Distance Ambulated (ft) 4 ft  $Mobility charge 1 Mobility  Level of Assistance Modified independent, requires aide device or extra time  Assistive Device Front wheel walker  Mobility Referral Yes   Pt sitting in chair upon entry, utilizing RA. Pt completed chair mob, STS, and ambulation ModI. Pt ambulated approximately 4 ft in the room before seated rest break due to Pt feeling "exteremly dizzy"., with nausea at a "5 out of 10".  NT entry during session for vitals, Pt left sitting in chair.   [place]ie [place] Mobility Specialist 12/24/21 11:31 AM

## 2021-12-24 NOTE — Progress Notes (Signed)
[facility] Cardiology   [place] CARDIOLOGY CONSULT NOTE       Patient ID: Stacey Stone MRN: [id number] DOB/AGE: 78-Jun-1947 78 y.o.  Admit date: 12/20/2021 Referring Physician Dr. Francine Graven Primary Physician Dr. Emily Filbert Primary Cardiologist Dr. Saralyn Pilar Reason for Consultation atrial fibrillation with RVR  HPI: Stacey Stone is a 76 year old female with a past medical history of paroxysmal atrial fibrillation on sotalol and Eliquis, history of breast cancer s/p XRT and letrozole x5 years, stage III diffuse large B-cell lymphoma currently on chemotherapy, history of recurrent UTIs on prophylactic Levaquin who presented to [facility] ED 12/20/2021 with jaw pain that radiated to her left shoulder and chest wall that usually occurs when her heart rate goes up, she was tachycardic to the 200s per EMS.  Cardiology is consulted for assistance with her atrial fibrillation.  Interval history: -Multiple short runs of paroxysmal SVT this morning, predominantly remaining in sinus rhythm -Felt a little dizzy this morning and overall weak -No chest pain, palpitations, shortness of breath  Vitals:   12/23/21 2358 12/24/21 0349 12/24/21 0802 12/24/21 1116  BP: 115/89 130/82 136/84 91/73  Pulse: (!) 43 76 76 73  Resp: '16 17 18 18  '$ Temp: 98.6 F (37 C) 99.3 F (37.4 C) 98.3 F (36.8 C) 98.1 F (36.7 C)  TempSrc: Oral Oral    SpO2: 95% 98% 95% 100%  Weight:      Height:         Intake/Output Summary (Last 24 hours) at 12/24/2021 1158 Last data filed at 12/24/2021 0003 Gross per 24 hour  Intake --  Output 1400 ml  Net -1400 ml       PHYSICAL EXAM General: Ill-appearing Caucasian female,  in no acute distress.  Sitting upright in hospital bed, resting with eyes closed HEENT:  Normocephalic and atraumatic. Neck:   No JVD.  Lungs: Normal respiratory effort on room air.  Clear to auscultation bilaterally  Heart: HRRR . Normal S1 and S2 without gallops or murmurs.   Abdomen:  non-distended appearing.  Msk: Normal strength and tone for age. Extremities: No clubbing, cyanosis, edema.  Neuro: Alert and oriented x3 Psych:  mood appropriate for situation.     LABS: Basic Metabolic Panel: Recent Labs    12/23/21 0027 12/23/21 0751 12/24/21 0627  NA 133*  --  130*  K 3.7  --  3.4*  CL 103  --  96*  CO2 27  --  26  GLUCOSE 92  --  91  BUN 10  --  9  CREATININE 0.57  --  0.52  CALCIUM 8.2*  --  8.1*  MG  --  1.9 1.8    Liver Function Tests: No results for input(s): "AST", "ALT", "ALKPHOS", "BILITOT", "PROT", "ALBUMIN" in the last 72 hours.  No results for input(s): "LIPASE", "AMYLASE" in the last 72 hours. CBC: Recent Labs    12/23/21 0027 12/24/21 0627  WBC 2.6* 0.3*  HGB 7.3* 7.8*  HCT 23.0* 23.0*  MCV 97.9 91.3  PLT 27* 30*    Cardiac Enzymes: No results for input(s): "CKTOTAL", "CKMB", "CKMBINDEX", "TROPONINI" in the last 72 hours. BNP: Invalid input(s): "POCBNP" D-Dimer: No results for input(s): "DDIMER" in the last 72 hours. Hemoglobin A1C: No results for input(s): "HGBA1C" in the last 72 hours. Fasting Lipid Panel: No results for input(s): "CHOL", "HDL", "LDLCALC", "TRIG", "CHOLHDL", "LDLDIRECT" in the last 72 hours. Thyroid Function Tests: No results for input(s): "TSH", "T4TOTAL", "T3FREE", "THYROIDAB" in the last 72 hours.  Invalid  input(s): "FREET3"  Anemia Panel: No results for input(s): "VITAMINB12", "FOLATE", "FERRITIN", "TIBC", "IRON", "RETICCTPCT" in the last 72 hours.  No results found.   Echo EF 60-65% 12/01/2021  TELEMETRY: Sinus rhythm 70s to 80s bpm:  Data reviewed by me: Hospitalist progress note, nursing notes, CBC, BMP, telemetry, cross cover progress note, procalcitonin, troponins  ASSESSMENT AND PLAN:  Principal Problem:   Atrial fibrillation with rapid ventricular response ([place]) Active Problems:   Hypothyroidism   Sepsis ([place])   DLBCL (diffuse large B cell lymphoma) ([place])   Diabetes mellitus  without complication ([place])   History of breast cancer   Hypokalemia   Elevated troponin   Thrombocytopenia ([facility])   Transaminitis   Depression   UTI (urinary tract infection)    1.  Atrial fibrillation with rapid ventricular rate in the setting of sepsis, initially on amiodarone infusion, converted to sinus rhythm, back on sotalol for rate and rhythm control, and Eliquis for stroke prevention 2.  Sepsis, secondary to E. coli cystitis elevated procalcitonin and white count, on p.o. Bactrim 3.  Elevated troponin (37, 518, 447), in the absence of chest pain or ECG changes, likely demand supply ischemia 4.  Coronary artery disease, DES D1 09/24/2015, patent stent by cardiac catheterization 10/26/2017, currently without chest pain 5.  Diffuse large B-cell lymphoma, followed by Dr. Grayland Ormond, most recent chemotherapy 12/18/2021  Recommendations  1.  Agree with current therapy 2.  Agree with holding Eliquis with marked thrombocytopenia (platelets 30,000), oncology to resume once platelet count greater than 50,000 3.  Continue sotalol for rate and rhythm control 4.  Monitor and replete electrolytes to magnesium >2, potassium >4 5.  Minimize use of QTc prolonging agents while on sotalol (antiemetics) 6.  Defer further cardiac diagnostics at this time  Cardiology will sign off.  Please reengage if needed  This patient's plan of care was discussed and created with Dr. Saralyn Pilar and he is in agreement.    Tristan Schroeder, PA-C 12/24/2021 11:58 AM

## 2021-12-24 NOTE — Progress Notes (Signed)
Mobility Specialist - Progress Note  Post-mobility: HR, BP, SPO2 100%   12/24/21 1140  Mobility  Activity Ambulated independently in room;Transferred from chair to bed  Activity Response Tolerated well  Distance Ambulated (ft) 4 ft  $Mobility charge 1 Mobility  Level of Assistance Modified independent, requires aide device or extra time  Assistive Device Front wheel walker  Mobility Referral Yes   Pt sitting in chair, utilizing RA. Pt completed chair mob, STS and ambulation ModI with RW. Pt ambulated 4 ft from chair to bed, voiced that dizziness was "better" than the previous session. Pt left supine with alarm set and needs within reach, family present at bedside.   Candie Mile Mobility Specialist 12/24/21 11:44 AM

## 2021-12-25 DIAGNOSIS — I4891 Unspecified atrial fibrillation: Secondary | ICD-10-CM | POA: Diagnosis not present

## 2021-12-25 LAB — CULTURE, BLOOD (ROUTINE X 2)
Culture: NO GROWTH
Culture: NO GROWTH
Special Requests: ADEQUATE
Special Requests: ADEQUATE

## 2021-12-25 MED ORDER — MIDODRINE HCL 5 MG PO TABS: 5.0000 mg | ORAL_TABLET | Freq: Three times a day (TID) | ORAL | Status: DC

## 2021-12-25 NOTE — Care Management Important Message (Signed)
Important Message  Patient Details  Name: Stacey Stone MRN: [id number] Date of Birth: 01-01-1946   Medicare Important Message Given:  Yes  Reviewed Medicare IM with Melissa, daughter, via room phone due to isolation status.  Copy of Medicare IM delivered to daughter outside patient's room.     Dannette Barbara 12/25/2021, 2:22 PM

## 2021-12-25 NOTE — Evaluation (Signed)
Physical Therapy Evaluation Patient Details Name: Stacey Stone MRN: [id number] DOB: 09-Dec-1945 Today's Date: 12/25/2021  History of Present Illness  Pt is a 76 year old female admitted with a fib with RVR after presenting to ED with bilateral jaw pain with radiation to her left shoulder and left anterior chest wall; PMH significant for  atrial fibrillation on chronic anticoagulation therapy with Eliquis, history of breast cancer status post radiation therapy and recently completed a 5-year course of letrozole, coronary artery disease, stage III diffuse large B-cell lymphoma currently on chemotherapy with last treatment.   Clinical Impression  Patient A&Ox4, reported no pain. She stated at baseline she lives alone is and is modI with RW or SPC depending on the day. However for the last week has needed increased assistance/been more challenged with all ADLs.  The patient was able to perform supine <> sit modI, though effortful. Seated rest break needed in between each bout of mobility. Sit <> stand twice during session with RW and supervision, cued for hand placement. She ambulated 49f twice, with sigificant rest break in between bouts. Did report some nausea (BP 105/75 once returned to sitting). Pt motivated and participated with seated LAQ as well.  Overall the patient demonstrated deficits (see "PT Problem List") that impede the patient's functional abilities, safety, and mobility and would benefit from skilled PT intervention. Recommendation at this time is SNF due to a significant decrease in pt activity tolerance, endurance and ability to care for self compared to baseline.         Recommendations for follow up therapy are one component of a multi-disciplinary discharge planning process, led by the attending physician.  Recommendations may be updated based on patient status, additional functional criteria and insurance authorization.  Follow Up Recommendations Skilled nursing-short term  rehab (<3 hours/day) Can patient physically be transported by private vehicle: Yes    Assistance Recommended at Discharge Intermittent Supervision/Assistance  Patient can return home with the following  A little help with walking and/or transfers;A little help with bathing/dressing/bathroom;Assistance with cooking/housework;Assist for transportation;Help with stairs or ramp for entrance    Equipment Recommendations None recommended by PT  Recommendations for Other Services       Functional Status Assessment Patient has had a recent decline in their functional status and demonstrates the ability to make significant improvements in function in a reasonable and predictable amount of time.     Precautions / Restrictions Precautions Precautions: Fall Precaution Comments: watch BP Restrictions Weight Bearing Restrictions: No      Mobility  Bed Mobility Overal bed mobility: Needs Assistance Bed Mobility: Supine to Sit, Sit to Supine     Supine to sit: HOB elevated, Modified independent (Device/Increase time) Sit to supine: Modified independent (Device/Increase time)   General bed mobility comments: increased effort    Transfers Overall transfer level: Needs assistance Equipment used: Rolling walker (2 wheels) Transfers: Sit to/from Stand Sit to Stand: Min guard           General transfer comment: 2 from EOB    Ambulation/Gait Ambulation/Gait assistance: Min guard Gait Distance (Feet):  (362f twice) Assistive device: Rolling walker (2 wheels)         General Gait Details: cautious  Stairs            Wheelchair Mobility    Modified Rankin (Stroke Patients Only)       Balance Overall balance assessment: Needs assistance Sitting-balance support: Feet supported, Bilateral upper extremity supported Sitting balance-Leahy Scale: Good  Standing balance support: During functional activity, Single extremity supported Standing balance-Leahy Scale:  Fair                               Pertinent Vitals/Pain Pain Assessment Pain Assessment: No/denies pain    Home Living Family/patient expects to be discharged to:: Private residence Living Arrangements: Children (adult son with down syndrome) Available Help at Discharge: Family;Available PRN/intermittently;Friend(s) Type of Home: Other(Comment) (townhouse) Home Access: Stairs to enter Entrance Stairs-Rails: None Entrance Stairs-Number of Steps: 1 threshold step   Home Layout: Two level;Able to live on main level with bedroom/bathroom Home Equipment: Kasandra Knudsen - single point;Shower seat - built Medical sales representative (2 wheels)      Prior Function Prior Level of Function : Independent/Modified Independent;Working/employed;Driving             Mobility Comments: Pt reports requiring use of RW since last chemo treatment ADLs Comments: Pt reports increased assist required for all ADLs/IADLs right now, typically MOD I however significant decline per pt and daugther report over the last week since previous chemo tx     Hand Dominance        Extremity/Trunk Assessment   Upper Extremity Assessment Upper Extremity Assessment: Defer to OT evaluation    Lower Extremity Assessment Lower Extremity Assessment: Generalized weakness    Cervical / Trunk Assessment Cervical / Trunk Assessment: Normal  Communication   Communication: No difficulties  Cognition Arousal/Alertness: Awake/alert Behavior During Therapy: WFL for tasks assessed/performed Overall Cognitive Status: Within Functional Limits for tasks assessed                                          General Comments      Exercises Other Exercises Other Exercises: seated LAQ, educated on bed level isometrics and heel slides, as well as importance of OOB mobility   Assessment/Plan    PT Assessment Patient needs continued PT services  PT Problem List Decreased strength;Decreased activity  tolerance;Decreased balance;Decreased mobility;Decreased knowledge of use of DME       PT Treatment Interventions DME instruction;Therapeutic exercise;Gait training;Balance training;Stair training;Neuromuscular re-education;Functional mobility training;Therapeutic activities;Patient/family education    PT Goals (Current goals can be found in the Care Plan section)  Acute Rehab PT Goals Patient Stated Goal: to get back to normal PT Goal Formulation: With patient Time For Goal Achievement: 01/08/22 Potential to Achieve Goals: Good    Frequency Min 2X/week     Co-evaluation               AM-PAC PT "6 Clicks" Mobility  Outcome Measure Help needed turning from your back to your side while in a flat bed without using bedrails?: None Help needed moving from lying on your back to sitting on the side of a flat bed without using bedrails?: None Help needed moving to and from a bed to a chair (including a wheelchair)?: None Help needed standing up from a chair using your arms (e.g., wheelchair or bedside chair)?: None Help needed to walk in hospital room?: A Little Help needed climbing 3-5 steps with a railing? : A Lot 6 Click Score: 21    End of Session   Activity Tolerance: Patient limited by fatigue Patient left: in bed;with call bell/phone within reach;with bed alarm set;with family/visitor present Nurse Communication: Mobility status PT Visit Diagnosis: Muscle weakness (generalized) (M62.81);Unsteadiness on feet (  R26.81);Other abnormalities of gait and mobility (R26.89);Difficulty in walking, not elsewhere classified (R26.2)    Time: 7471-5953 PT Time Calculation (min) (ACUTE ONLY): 20 min   Charges:   PT Evaluation $PT Eval Low Complexity: 1 Low PT Treatments $Therapeutic Exercise: 8-22 mins        Lieutenant Diego PT, DPT 3:41 PM,12/25/21

## 2021-12-25 NOTE — Evaluation (Signed)
Occupational Therapy Evaluation Patient Details Name: Stacey Stone MRN: [id number] DOB: 06-15-1945 Today's Date: 12/25/2021   History of Present Illness Pt is a 76 year old female admitted with a fib with RVR after presenting to ED with bilateral jaw pain with radiation to her left shoulder and left anterior chest wall; PMH significant for  atrial fibrillation on chronic anticoagulation therapy with Eliquis, history of breast cancer status post radiation therapy and recently completed a 5-year course of letrozole, coronary artery disease, stage III diffuse large B-cell lymphoma currently on chemotherapy with last treatment   Clinical Impression   Chart reviewed pt greeted in room with daughter present. Pt is alert and oriented x4. Pt reports PTA due to chemo requiring assist for all ADL/IADL, amb with RW; prior to chemo/tx pt was indep in ADL/IADL, working. Pt presents with deficits in activity tolerance, strength, and endurance affecting safe and optimal ADL completion. Pt reports feeling dizzy with position changes, therefore RW utilized for safe mobility, anticipate pt may require increased assist with no AD.Pt is able to perform bed mobility with MOD I, STS with CGA, amb approx 10' in room with CGA with RW. Pt unable to progress further mobility due to fatigue. Pt performs LB dressing/peri care with CGA with STS. Pt endorses concern for amb household distances to perform ADL/IADL due to level of fatigue and weakness. Education provided to pt and daughter re: role of OT, role of rehab, qualifiers for rehab. Pt reports she prefers short stint in STR to improve functional strength and endurance in order to safety complete ADL/IADL. If pt continues to improve functional status, educated pt could dc home with max HHOT. At this time, recommend discharge to STR to address functional deficits and to facilitate return to PLOF. Pt is left in bedside chair, all needs met. OT will follow acutely.   Of  note:  Pt c/o dizziness with position changes  BP in sitting: 109/73 (MAP 84) HR 79 BP in standing: 95/61 (MAP 71) HR 79 BP in sitting after amb 10;" 104/69 (MAP 80) HR 72   Spo2 >95% on RA throughout    Recommendations for follow up therapy are one component of a multi-disciplinary discharge planning process, led by the attending physician.  Recommendations may be updated based on patient status, additional functional criteria and insurance authorization.   Follow Up Recommendations  Skilled nursing-short term rehab (<3 hours/day)    Assistance Recommended at Discharge Intermittent Supervision/Assistance  Patient can return home with the following A little help with walking and/or transfers;A little help with bathing/dressing/bathroom    Functional Status Assessment  Patient has had a recent decline in their functional status and demonstrates the ability to make significant improvements in function in a reasonable and predictable amount of time.  Equipment Recommendations  BSC/3in1    Recommendations for Other Services       Precautions / Restrictions Precautions Precautions: Fall Precaution Comments: watch BP Restrictions Weight Bearing Restrictions: No      Mobility Bed Mobility Overal bed mobility: Needs Assistance Bed Mobility: Supine to Sit     Supine to sit: HOB elevated, Modified independent (Device/Increase time)     General bed mobility comments: increased effort    Transfers Overall transfer level: Needs assistance Equipment used: Rolling walker (2 wheels) Transfers: Sit to/from Stand Sit to Stand: Min guard                  Balance Overall balance assessment: Needs assistance Sitting-balance support: Feet supported,  Bilateral upper extremity supported Sitting balance-Leahy Scale: Good     Standing balance support: During functional activity, Single extremity supported Standing balance-Leahy Scale: Fair                              ADL either performed or assessed with clinical judgement   ADL Overall ADL's : Needs assistance/impaired Eating/Feeding: Set up;Sitting   Grooming: Sitting;Set up Grooming Details (indicate cue type and reason): anticipated         Upper Body Dressing : Set up Upper Body Dressing Details (indicate cue type and reason): anticipated Lower Body Dressing: Min guard;Sit to/from stand Lower Body Dressing Details (indicate cue type and reason): brief, socks Toilet Transfer: Min guard;Ambulation;Rolling walker (2 wheels) Toilet Transfer Details (indicate cue type and reason): simulated to bedside chair Toileting- Clothing Manipulation and Hygiene: Min guard;Sit to/from stand       Functional mobility during ADLs: Supervision/safety;Min guard;Rolling walker (2 wheels) (pt unable to tolerate amb more than 10' with RW due to decreased endurance, activity tolerance)       Vision Patient Visual Report: No change from baseline       Perception     Praxis      Pertinent Vitals/Pain Pain Assessment Pain Assessment: No/denies pain     Hand Dominance     Extremity/Trunk Assessment Upper Extremity Assessment Upper Extremity Assessment: Overall WFL for tasks assessed   Lower Extremity Assessment Lower Extremity Assessment: Generalized weakness   Cervical / Trunk Assessment Cervical / Trunk Assessment: Normal   Communication Communication Communication: No difficulties   Cognition Arousal/Alertness: Awake/alert   Overall Cognitive Status: Within Functional Limits for tasks assessed                                       General Comments       Exercises     Shoulder Instructions      Home Living Family/patient expects to be discharged to:: Private residence Living Arrangements: Children (adult son with down syndrome) Available Help at Discharge: Family;Available PRN/intermittently;Friend(s) Type of Home: Other(Comment) (townhouse) Home Access:  Stairs to enter [employer] of Steps: 1 threshold step Entrance Stairs-Rails: None Home Layout: Two level;Able to live on main level with bedroom/bathroom     Bathroom Shower/Tub: Occupational psychologist: Standard     Home Equipment: Cane - single point;Shower seat - built Medical sales representative (2 wheels)          Prior Functioning/Environment Prior Level of Function : Independent/Modified Independent;Working/employed;Driving             Mobility Comments: Pt reports requiring use of RW since last chemo treatment ADLs Comments: Pt reports increased assist required for all ADLs/IADLs right now, typically MOD I however significant decline per pt and daugther report over the last week since previous chemo tx        OT Problem List: Decreased strength;Decreased activity tolerance;Decreased knowledge of use of DME or AE      OT Treatment/Interventions: Self-care/ADL training;DME and/or AE instruction;Therapeutic activities;Therapeutic exercise;Energy conservation;Patient/family education    OT Goals(Current goals can be found in the care plan section) Acute Rehab OT Goals Patient Stated Goal: return to PLOF OT Goal Formulation: With patient/family Time For Goal Achievement: 01/08/22 Potential to Achieve Goals: Good ADL Goals Pt Will Perform Grooming: with modified independence;standing Pt Will Perform Lower Body  Dressing: with modified independence;sit to/from stand Pt Will Transfer to Toilet: with modified independence;ambulating Pt Will Perform Toileting - Clothing Manipulation and hygiene: with modified independence;sit to/from stand  OT Frequency:      Co-evaluation              AM-PAC OT "6 Clicks" Daily Activity     Outcome Measure Help from another person eating meals?: None Help from another person taking care of personal grooming?: None Help from another person toileting, which includes using toliet, bedpan, or urinal?: A Little Help from  another person bathing (including washing, rinsing, drying)?: A Little Help from another person to put on and taking off regular upper body clothing?: None Help from another person to put on and taking off regular lower body clothing?: None 6 Click Score: 22   End of Session Equipment Utilized During Treatment: Gait belt;Rolling walker (2 wheels) Nurse Communication: Mobility status  Activity Tolerance: Patient limited by fatigue Patient left: in chair;with call bell/phone within reach;with family/visitor present  OT Visit Diagnosis: Unsteadiness on feet (R26.81);Muscle weakness (generalized) (M62.81)                Time: 9507-2257 OT Time Calculation (min): 37 min Charges:  OT General Charges $OT Visit: 1 Visit OT Evaluation $OT Eval Moderate Complexity: 1 Mod OT Treatments $Self Care/Home Management : 8-22 mins  Shanon Payor, OTD OTR/L  12/25/21, 1:39 PM

## 2021-12-25 NOTE — NC FL2 (Signed)
[place] LEVEL OF CARE SCREENING TOOL     IDENTIFICATION  Patient Name: Stacey Stone Birthdate: 1946/01/06 Sex: female Admission Date (Current Location): 12/20/2021  County and [place] Number:  Engineering geologist and Address:  [facility], [address] [place], [place] [address]      Provider Number: [id number]  Attending Physician Name and Address:  Fritzi Mandes, MD  Relative Name and Phone Number:  Lenna Sciara (Daughter) [phone]    Current Level of Care: Hospital Recommended Level of Care: [place] Prior Approval Number:    Date Approved/Denied:   PASRR Number: [id number]  Discharge Plan: SNF    Current Diagnoses: Patient Active Problem List   Diagnosis Date Noted   UTI (urinary tract infection) 12/21/2021   Atrial fibrillation with rapid ventricular response ([place]) 12/20/2021   Elevated troponin 12/20/2021   Thrombocytopenia ([place]) 12/20/2021   Transaminitis 12/20/2021   Depression 12/20/2021   Leukocytosis    Weakness 11/26/2021   COVID-19 virus RNA test result positive at limit of detection 11/26/2021   Elevated d-dimer 11/08/2021   Ulcer of RIGHT buttock ([place]) 11/08/2021   Thrombocytopenia due to drugs 11/07/2021   Pancytopenia due to chemotherapy ([place]) 11/07/2021   Sepsis due to COVID-19 ([place]) 11/06/2021   Hypokalemia 11/06/2021   Bacteremia due to ESBL-producing Escherichia coli source UTI 09/26/2021   Finger nail contusion, initial encounter 09/25/2021   Bacteremia 09/24/2021   Dehydration 09/23/2021   Febrile neutropenia ([place]) 09/23/2021   Diabetes mellitus without complication ([place])    Pancytopenia, chemotherapy induced(HCC)    Urinary tract infection    COVID-19 virus infection    DLBCL (diffuse large B cell lymphoma) ([place]) 09/10/2021   Peripheral edema 07/13/2019   Sepsis ([place]) 05/24/2019   Cellulitis of right lower extremity 05/24/2019   Bilateral edema of lower extremity  05/24/2019   Abnormal LFTs 06/06/2018   History of breast cancer 06/06/2018   Status post ablation of atrial fibrillation 02/08/2018   PMB (postmenopausal bleeding) 06/17/2016   Preoperative cardiovascular examination 05/13/2016   Primary cancer of upper outer quadrant of left female breast ([place]) 05/09/2016   Unstable angina ([place]) 09/24/2015   S/P cardiac catheterization 09/24/2015   History of total right knee replacement 06/25/2015   B12 deficiency 01/15/2015   Vitamin D deficiency 01/15/2015   Paroxysmal atrial fibrillation ([place]) 12/15/2014   Aortic valve disorder 11/06/2014   Hypersomnia with sleep apnea 11/06/2014   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   OSA on CPAP 01/02/2014   Angina at rest [facility]) 06/16/2013   Essential hypertension 06/16/2013   Hypercholesterolemia 06/16/2013   Hypothyroidism 06/16/2013   Rhegmatogenous retinal detachment of left eye 02/16/2011    Orientation RESPIRATION BLADDER Height & Weight     Self, Time, Situation, Place  Normal Continent, External catheter Weight: 157 lb (71.2 kg) Height:  '5\' 2"'$  (157.5 cm)  BEHAVIORAL SYMPTOMS/MOOD NEUROLOGICAL BOWEL NUTRITION STATUS      Continent Diet (see discharge summary)  AMBULATORY STATUS COMMUNICATION OF NEEDS Skin   Limited Assist Verbally Normal                       Personal Care Assistance Level of Assistance  Bathing, Dressing, Total care, Feeding Bathing Assistance: Limited assistance Feeding assistance: Independent Dressing Assistance: Limited assistance Total Care Assistance: Limited assistance   Functional Limitations Info  Hearing, Speech, Sight Sight Info: Impaired Hearing Info: Adequate Speech Info: Adequate    SPECIAL CARE FACTORS FREQUENCY  PT (By  licensed PT), OT (By licensed OT)     PT Frequency: min 4x weekly OT Frequency: min 4x weekly            Contractures Contractures Info: Not present    Additional Factors Info  Code Status, Allergies Code Status Info:  full Allergies Info: codeine, tramadol           Current Medications (12/25/2021):  This is the current hospital active medication list Current Facility-Administered Medications  Medication Dose Route Frequency Provider Last Rate Last Admin   acetaminophen (TYLENOL) tablet 500 mg  500 mg Oral Q4H PRN Agbata, Tochukwu, MD   500 mg at 12/22/21 2139   atorvastatin (LIPITOR) tablet 40 mg  40 mg Oral QHS Agbata, Tochukwu, MD   40 mg at 12/24/21 2205   Chlorhexidine Gluconate Cloth 2 % PADS 6 each  6 each Topical Daily Fritzi Mandes, MD   6 each at 12/24/21 1020   cyanocobalamin (VITAMIN B12) tablet 1,000 mcg  1,000 mcg Oral Daily Agbata, Tochukwu, MD   1,000 mcg at 12/25/21 0820   feeding supplement (GLUCERNA SHAKE) (GLUCERNA SHAKE) liquid 237 mL  237 mL Oral TID BM Fritzi Mandes, MD   237 mL at 12/22/21 1216   ferrous sulfate tablet 325 mg  325 mg Oral Q breakfast Agbata, Tochukwu, MD   325 mg at 12/25/21 0820   levothyroxine (SYNTHROID) tablet 125 mcg  125 mcg Oral Q0600 Agbata, Tochukwu, MD   125 mcg at 12/25/21 0627   loratadine (CLARITIN) tablet 10 mg  10 mg Oral Daily Agbata, Tochukwu, MD   10 mg at 12/25/21 0820   magnesium oxide (MAG-OX) tablet 400 mg  400 mg Oral BID Fritzi Mandes, MD   400 mg at 12/25/21 0820   melatonin tablet 5 mg  5 mg Oral QHS Sharion Settler, NP   5 mg at 12/24/21 2205   metoCLOPramide (REGLAN) tablet 5 mg  5 mg Oral TID Eldridge Dace, MD   5 mg at 12/25/21 1240   mometasone-formoterol (DULERA) 100-5 MCG/ACT inhaler 2 puff  2 puff Inhalation BID Agbata, Tochukwu, MD       multivitamin with minerals tablet 1 tablet  1 tablet Oral Daily Fritzi Mandes, MD   1 tablet at 12/25/21 0820   nitroGLYCERIN (NITROSTAT) SL tablet 0.4 mg  0.4 mg Sublingual Q5 min PRN Agbata, Tochukwu, MD       ondansetron (ZOFRAN) tablet 4 mg  4 mg Oral Q4H PRN Fritzi Mandes, MD       Or   ondansetron [facility]) injection 4 mg  4 mg Intravenous Q6H PRN Fritzi Mandes, MD   4 mg at 12/24/21 1030    pantoprazole (PROTONIX) EC tablet 40 mg  40 mg Oral Daily Agbata, Tochukwu, MD   40 mg at 12/25/21 0820   sertraline (ZOLOFT) tablet 50 mg  50 mg Oral QHS Agbata, Tochukwu, MD   50 mg at 12/24/21 2205   sotalol (BETAPACE) tablet 80 mg  80 mg Oral Q12H Fritzi Mandes, MD   80 mg at 12/25/21 0820   sulfamethoxazole-trimethoprim (BACTRIM DS) 800-160 MG per tablet 1 tablet  1 tablet Oral Q12H Wynelle Cleveland, RPH   1 tablet at 12/25/21 1324   Facility-Administered Medications Ordered in Other Encounters  Medication Dose Route Frequency Provider Last Rate Last Admin   acetaminophen (TYLENOL) 325 MG tablet            diphenhydrAMINE (BENADRYL) 25 mg capsule  heparin lock flush 100 UNIT/ML injection            heparin lock flush 100 UNIT/ML injection            palonosetron (ALOXI) 0.25 MG/5ML injection              Discharge Medications: Please see discharge summary for a list of discharge medications.  Relevant Imaging Results:  Relevant Lab Results:   Additional Information [id number]  Alberteen Sam, LCSW

## 2021-12-25 NOTE — Progress Notes (Signed)
[place] at [place] NAME: Stacey Stone    MR#:  [id number]  DATE OF BIRTH:  1946/01/29  SUBJECTIVE:  patient now in sinus rhythm. Daughter at bedside today Nausea improving Eating a bit better worked with occupational therapy. Gets very easily fatigued. Blood pressure soft. Awaiting PT eval. Patient worried about being by herself at home given significant deconditioning  VITALS:  Blood pressure 98/70, pulse 68, temperature 98.4 F (36.9 C), temperature source Oral, resp. rate (!) 22, height '5\' 2"'$  (1.575 m), weight 71.2 kg, SpO2 97 %.  PHYSICAL EXAMINATION:   GENERAL:  76 y.o.-year-old patient lying in the bed with no acute distress.alopecia ,weak LUNGS: Normal breath sounds bilaterally CARDIOVASCULAR: S1, S2 normal. No murmurs ABDOMEN: Soft, nontender, nondistended. Bowel sounds present.  EXTREMITIES: No  edema b/l.    NEUROLOGIC: nonfocal  patient is alert and awake, weak, deconditioned  LABORATORY PANEL:  CBC Recent Labs  Lab 12/24/21 0627  WBC 0.3*  HGB 7.8*  HCT 23.0*  PLT 30*     Chemistries  Recent Labs  Lab 12/20/21 0817 12/20/21 1004 12/24/21 0627  NA 138   < > 130*  K 3.3*   < > 3.4*  CL 112*   < > 96*  CO2 22   < > 26  GLUCOSE 98   < > 91  BUN 21   < > 9  CREATININE 0.82   < > 0.52  CALCIUM 8.5*   < > 8.1*  MG  --    < > 1.8  AST 111*  --   --   ALT 67*  --   --   ALKPHOS 165*  --   --   BILITOT 1.1  --   --    < > = values in this interval not displayed.    Cardiac Enzymes No results for input(s): "TROPONINI" in the last 168 hours. RADIOLOGY:  No results found.  Assessment and Plan  HPI: PAT ELICKER is a 76 y.o. female with medical history significant for atrial fibrillation on chronic anticoagulation therapy with Eliquis, history of breast cancer status post radiation therapy and recently completed a 5-year course of letrozole, coronary artery disease, stage III diffuse large  B-cell lymphoma currently on chemotherapy with last treatment on 12/17/21, history of recurrent UTI on daily prophylactic therapy with Levaquin, history of hypotension who presents to the ER by EMS for evaluation of bilateral jaw pain with radiation to her left shoulder and left anterior chest wall which started about 5 AM on the day of admission.  Per patient she usually feels this way when her heart rate goes up.  She called EMS and was noted to be tachycardic with heart rates in the 200's.  Chest x-ray reviewed by me shows no evidence of active cardiopulmonary disease CT scan of abdomen and pelvis shows bladder with wall thickening and irregularity and subtle adjacent inflammation consistent with cystitis. No other acute abnormality within the abdomen or pelvis. Aortic atherosclerosis.  A fib with RVR -- patient has known history of atrial fibrillation on chronic anticoagulation -- heart rate much improved after IV amiodarone drip. Seen by Dr. Neoma Laming -- Patient's primary cardiologist is Dr. Saralyn Pilar-- patient currently in sinus rhythm. Will switch her back to sotalol and resume eliquis. Discontinue heparin drip. -- Patient will follow-up cardiology as outpatient --10/4--holding eliquis due to plt count <50K  Intractable nausea -- patient receiving schedule Reglan and PRN Zofran. -- Suspected  due to chemo effect. -- Encourage to eat small frequent meals --improving slowly --has been having BM  Diffuse large B-cell lymphoma -- patient follows with Dr. Grayland Ormond -- last cycle chemotherapy was on 29th of September -- monitor accounts -- both daughter and patient do not want any further cycles of chemo.  Sepsis -- as evident by hypotension that responded to IV fluids, tachycardia tachypnea and leukocytosis-- unclear source -- Pro calcitonin elevated --Patient has some cough. Chest x-ray negative for pneumonia p-- empirically on IV meropenem -- urine culture ecoli -blood-culture  negative -- continue to monitor and de-escalate antibiotic to po bactrim per c/s --sepsis resolved  Elevated troponin -- suspected due to demand ischemia from rapid a fib. -- Patient denies any chest pain. -- Has history of CAD--Status post left heart cath in 2019 which showed one-vessel coronary artery disease with occluded proximal RCA with ipsi- and contra- collaterals. Patent stent in first diagonal branch. Normal left ventricular function. --pt remains in NSR  Hypothyroidism -- continue Synthroid  Depression -- continue Zoloft  Thrombocytopenia -- likely due to chemo  -- plt <50 K--hold eliquis per Dr Grayland Ormond  History of breast cancer Status post radiation therapy and has completed a 5-year course of letrozole.   Diabetes mellitus without complication ([place]) Appears to be diet controlled Check blood sugars AC meals Maintain consistent carbohydrate diet  Family communication none today Consults : cardiology, oncology CODE STATUS: full DVT Prophylaxis :eliquis Level of care: Telemetry Medical Status is: Inpatient Remains inpatient appropriate because: awaiting PT eval. Patient is very deconditioned. EDD: unknown  TOTAL TIME TAKING CARE OF THIS PATIENT: 25 minutes.  >50% time spent on counselling and coordination of care  Note: This dictation was prepared with Dragon dictation along with smaller phrase technology. Any transcriptional errors that result from this process are unintentional.  Fritzi Mandes M.D    [facility]   CC: Primary care physician; Rusty Aus, MD

## 2021-12-25 NOTE — TOC Initial Note (Signed)
Transition of Care [facility]) - Initial/Assessment Note    Patient Details  Name: Stacey Stone MRN: [id number] Date of Birth: Oct 25, 1945  Transition of Care [facility]) CM/SW Contact:    Alberteen Sam, LCSW Phone Number: 12/25/2021, 3:37 PM  Clinical Narrative:                  CSW spoke with patient's daughter regarding PT and OT SNF recommendations, she is in agreement with recommendation and reports no preference of facility.   CSW has sent out referrals pending bed offers at this time.    Expected Discharge Plan: Skilled Nursing Facility Barriers to Discharge: Continued Medical Work up   Patient Goals and CMS Choice Patient states their goals for this hospitalization and ongoing recovery are:: to go home CMS Medicare.gov Compare Post Acute Care list provided to:: Patient Represenative (must comment) (daughter) Choice offered to / list presented to : Adult Children  Expected Discharge Plan and Services Expected Discharge Plan: [place] arrangements for the past 2 months: Single Family Home                                      Prior Living Arrangements/Services Living arrangements for the past 2 months: Single Family Home Lives with:: Self                   Activities of Daily Living Home Assistive Devices/Equipment: Cane (specify quad or straight), Walker (specify type) ADL Screening (condition at time of admission) Patient's cognitive ability adequate to safely complete daily activities?: Yes Is the patient deaf or have difficulty hearing?: No Does the patient have difficulty seeing, even when wearing glasses/contacts?: No Does the patient have difficulty concentrating, remembering, or making decisions?: No Patient able to express need for assistance with ADLs?: Yes Does the patient have difficulty dressing or bathing?: No Independently performs ADLs?: No Communication: Independent Dressing (OT): Independent Grooming:  Independent Feeding: Independent Bathing: Independent Toileting: Independent Is this a change from baseline?: Pre-admission baseline In/Out Bed: Independent Is this a change from baseline?: Pre-admission baseline Walks in Home: Independent Is this a change from baseline?: Pre-admission baseline Does the patient have difficulty walking or climbing stairs?: No Weakness of Legs: None Weakness of Arms/Hands: None  Permission Sought/Granted                  Emotional Assessment       Orientation: : Oriented to Self, Oriented to Place, Oriented to  Time, Oriented to Situation Alcohol / Substance Use: Not Applicable Psych Involvement: No (comment)  Admission diagnosis:  UTI (urinary tract infection) [N39.0] Atrial fibrillation with rapid ventricular response ([place]) [I48.91] Atrial fibrillation with RVR (HCC) [I48.91] Patient Active Problem List   Diagnosis Date Noted   UTI (urinary tract infection) 12/21/2021   Atrial fibrillation with rapid ventricular response ([place]) 12/20/2021   Elevated troponin 12/20/2021   Thrombocytopenia ([place]) 12/20/2021   Transaminitis 12/20/2021   Depression 12/20/2021   Leukocytosis    Weakness 11/26/2021   COVID-19 virus RNA test result positive at limit of detection 11/26/2021   Elevated d-dimer 11/08/2021   Ulcer of RIGHT buttock ([place]) 11/08/2021   Thrombocytopenia due to drugs 11/07/2021   Pancytopenia due to chemotherapy ([place]) 11/07/2021   Sepsis due to COVID-19 ([place]) 11/06/2021   Hypokalemia 11/06/2021   Bacteremia due to ESBL-producing Escherichia coli source UTI 09/26/2021  Finger nail contusion, initial encounter 09/25/2021   Bacteremia 09/24/2021   Dehydration 09/23/2021   Febrile neutropenia ([place]) 09/23/2021   Diabetes mellitus without complication ([place])    Pancytopenia, chemotherapy induced(HCC)    Urinary tract infection    COVID-19 virus infection    DLBCL (diffuse large B cell lymphoma) ([place]) 09/10/2021   Peripheral edema  07/13/2019   Sepsis ([place]) 05/24/2019   Cellulitis of right lower extremity 05/24/2019   Bilateral edema of lower extremity 05/24/2019   Abnormal LFTs 06/06/2018   History of breast cancer 06/06/2018   Status post ablation of atrial fibrillation 02/08/2018   PMB (postmenopausal bleeding) 06/17/2016   Preoperative cardiovascular examination 05/13/2016   Primary cancer of upper outer quadrant of left female breast ([place]) 05/09/2016   Unstable angina ([place]) 09/24/2015   S/P cardiac catheterization 09/24/2015   History of total right knee replacement 06/25/2015   B12 deficiency 01/15/2015   Vitamin D deficiency 01/15/2015   Paroxysmal atrial fibrillation ([place]) 12/15/2014   Aortic valve disorder 11/06/2014   Hypersomnia with sleep apnea 11/06/2014   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   OSA on CPAP 01/02/2014   Angina at rest [facility]) 06/16/2013   Essential hypertension 06/16/2013   Hypercholesterolemia 06/16/2013   Hypothyroidism 06/16/2013   Rhegmatogenous retinal detachment of left eye 02/16/2011   PCP:  Rusty Aus, MD Pharmacy:   [facility], [place] [address] Phone: [phone] Fax: [phone]     Social Determinants of Health (SDOH) Interventions    Readmission Risk Interventions    11/27/2021    2:30 PM 11/09/2021    1:17 PM  Readmission Risk Prevention Plan  Transportation Screening Complete Complete  PCP or Specialist Appt within 3-5 Days  Complete  HRI or Home Care Consult  Complete  Social Work Consult for [place] Planning/Counseling  Complete  Palliative Care Screening Not Applicable Not Applicable  Medication Review Press photographer) Complete Complete

## 2021-12-26 DIAGNOSIS — I4891 Unspecified atrial fibrillation: Secondary | ICD-10-CM | POA: Diagnosis not present

## 2021-12-26 LAB — GLUCOSE, CAPILLARY
Glucose-Capillary: 113 mg/dL — ABNORMAL HIGH (ref 70–99)
Glucose-Capillary: 100 mg/dL — ABNORMAL HIGH (ref 70–99)

## 2021-12-26 NOTE — Progress Notes (Signed)
[place] at [place] NAME: Stacey Stone    MR#:  [id number]  DATE OF BIRTH:  24-Aug-1945  SUBJECTIVE:  patient now in sinus rhythm. Daughter at bedside today Nausea improving Eating a bit better Did work with PT Gets fatigued easily--but progressing well  VITALS:  Blood pressure 106/73, pulse 64, temperature 97.6 F (36.4 C), temperature source Oral, resp. rate 18, height '5\' 2"'$  (1.575 m), weight 71.2 kg, SpO2 98 %.  PHYSICAL EXAMINATION:   GENERAL:  76 y.o.-year-old patient lying in the bed with no acute distress.alopecia ,weak LUNGS: Normal breath sounds bilaterally CARDIOVASCULAR: S1, S2 normal. No murmurs ABDOMEN: Soft, nontender, nondistended. Bowel sounds present.  EXTREMITIES: No  edema b/l.    NEUROLOGIC: nonfocal  patient is alert and awake, weak, deconditioned  LABORATORY PANEL:  CBC Recent Labs  Lab 12/24/21 0627  WBC 0.3*  HGB 7.8*  HCT 23.0*  PLT 30*     Chemistries  Recent Labs  Lab 12/20/21 0817 12/20/21 1004 12/24/21 0627  NA 138   < > 130*  K 3.3*   < > 3.4*  CL 112*   < > 96*  CO2 22   < > 26  GLUCOSE 98   < > 91  BUN 21   < > 9  CREATININE 0.82   < > 0.52  CALCIUM 8.5*   < > 8.1*  MG  --    < > 1.8  AST 111*  --   --   ALT 67*  --   --   ALKPHOS 165*  --   --   BILITOT 1.1  --   --    < > = values in this interval not displayed.    Cardiac Enzymes No results for input(s): "TROPONINI" in the last 168 hours. RADIOLOGY:  No results found.  Assessment and Plan  HPI: Stacey Stone is a 76 y.o. female with medical history significant for atrial fibrillation on chronic anticoagulation therapy with Eliquis, history of breast cancer status post radiation therapy and recently completed a 5-year course of letrozole, coronary artery disease, stage III diffuse large B-cell lymphoma currently on chemotherapy with last treatment on 12/17/21, history of recurrent UTI on daily prophylactic  therapy with Levaquin, history of hypotension who presents to the ER by EMS for evaluation of bilateral jaw pain with radiation to her left shoulder and left anterior chest wall which started about 5 AM on the day of admission.  Per patient she usually feels this way when her heart rate goes up.  She called EMS and was noted to be tachycardic with heart rates in the 200's.  Chest x-ray reviewed by me shows no evidence of active cardiopulmonary disease CT scan of abdomen and pelvis shows bladder with wall thickening and irregularity and subtle adjacent inflammation consistent with cystitis. No other acute abnormality within the abdomen or pelvis. Aortic atherosclerosis.  A fib with RVR -- patient has known history of atrial fibrillation on chronic anticoagulation -- heart rate much improved after IV amiodarone drip. Seen by Dr. Neoma Laming -- Patient's primary cardiologist is Dr. Saralyn Pilar-- patient currently in sinus rhythm. Will switch her back to sotalol and resume eliquis. Discontinue heparin drip. -- Patient will follow-up cardiology as outpatient --10/4--holding eliquis due to plt count <50K  Intractable nausea -- patient receiving schedule Reglan and PRN Zofran. -- Suspected due to chemo effect. -- Encourage to eat small frequent meals --improving slowly --has been having BM  Diffuse large B-cell lymphoma -- patient follows with Dr. Grayland Ormond -- last cycle chemotherapy was on 29th of September -- monitor accounts -- both daughter and patient do not want any further cycles of chemo.  Sepsis -- as evident by hypotension that responded to IV fluids, tachycardia tachypnea and leukocytosis-- unclear source -- Pro calcitonin elevated --Patient has some cough. Chest x-ray negative for pneumonia p-- empirically on IV meropenem -- urine culture ecoli -blood-culture negative -- continue to monitor and de-escalate antibiotic to po bactrim per c/s --sepsis resolved  Elevated troponin --  suspected due to demand ischemia from rapid a fib. -- Patient denies any chest pain. -- Has history of CAD--Status post left heart cath in 2019 which showed one-vessel coronary artery disease with occluded proximal RCA with ipsi- and contra- collaterals. Patent stent in first diagonal branch. Normal left ventricular function. --pt remains in NSR  Hypothyroidism -- continue Synthroid  Depression -- continue Zoloft  Thrombocytopenia -- likely due to chemo  -- plt <50 K--hold eliquis per Dr Grayland Ormond  History of breast cancer Status post radiation therapy and has completed a 5-year course of letrozole.   Diabetes mellitus without complication ([place]) Appears to be diet controlled Check blood sugars AC meals Maintain consistent carbohydrate diet  Family communication none today Consults : cardiology, oncology CODE STATUS: full DVT Prophylaxis :eliquis Level of care: Telemetry Medical Status is: Inpatient Remains inpatient appropriate because: awaiting rehab bed. Patient is very deconditioned. EDD: unknown  TOTAL TIME TAKING CARE OF THIS PATIENT: 25 minutes.  >50% time spent on counselling and coordination of care  Note: This dictation was prepared with Dragon dictation along with smaller phrase technology. Any transcriptional errors that result from this process are unintentional.  Fritzi Mandes M.D    [facility]   CC: Primary care physician; Rusty Aus, MD

## 2021-12-27 DIAGNOSIS — I4891 Unspecified atrial fibrillation: Secondary | ICD-10-CM | POA: Diagnosis not present

## 2021-12-27 LAB — GLUCOSE, CAPILLARY: Glucose-Capillary: 98 mg/dL (ref 70–99)

## 2021-12-27 NOTE — Progress Notes (Signed)
[place] at [place] NAME: Stacey Stone    MR#:  [id number]  DATE OF BIRTH:  January 20, 1946  SUBJECTIVE:  patient now in sinus rhythm.   Eating better  Gets fatigued easily--but progressing well  VITALS:  Blood pressure 118/64, pulse 62, temperature 98.3 F (36.8 C), temperature source Oral, resp. rate 16, height '5\' 2"'$  (1.575 m), weight 71.2 kg, SpO2 99 %.  PHYSICAL EXAMINATION:   GENERAL:  76 y.o.-year-old patient lying in the bed with no acute distress.alopecia ,weak LUNGS: Normal breath sounds bilaterally CARDIOVASCULAR: S1, S2 normal. No murmurs ABDOMEN: Soft, nontender, nondistended. Bowel sounds present.  EXTREMITIES: No  edema b/l.    NEUROLOGIC: nonfocal  patient is alert and awake, weak, deconditioned  LABORATORY PANEL:  CBC Recent Labs  Lab 12/24/21 0627  WBC 0.3*  HGB 7.8*  HCT 23.0*  PLT 30*     Chemistries  Recent Labs  Lab 12/24/21 0627  NA 130*  K 3.4*  CL 96*  CO2 26  GLUCOSE 91  BUN 9  CREATININE 0.52  CALCIUM 8.1*  MG 1.8    Cardiac Enzymes No results for input(s): "TROPONINI" in the last 168 hours. RADIOLOGY:  No results found.  Assessment and Plan  HPI: Stacey Stone is a 76 y.o. female with medical history significant for atrial fibrillation on chronic anticoagulation therapy with Eliquis, history of breast cancer status post radiation therapy and recently completed a 5-year course of letrozole, coronary artery disease, stage III diffuse large B-cell lymphoma currently on chemotherapy with last treatment on 12/17/21, history of recurrent UTI on daily prophylactic therapy with Levaquin, history of hypotension who presents to the ER by EMS for evaluation of bilateral jaw pain with radiation to her left shoulder and left anterior chest wall which started about 5 AM on the day of admission.  Per patient she usually feels this way when her heart rate goes up.  She called EMS and was noted  to be tachycardic with heart rates in the 200's.  Chest x-ray reviewed by me shows no evidence of active cardiopulmonary disease CT scan of abdomen and pelvis shows bladder with wall thickening and irregularity and subtle adjacent inflammation consistent with cystitis. No other acute abnormality within the abdomen or pelvis. Aortic atherosclerosis.  A fib with RVR -- patient has known history of atrial fibrillation on chronic anticoagulation -- heart rate much improved after IV amiodarone drip. Seen by Dr. Neoma Laming -- Patient's primary cardiologist is Dr. Saralyn Pilar-- patient currently in sinus rhythm. Will switch her back to sotalol and resume eliquis. Discontinue heparin drip. -- Patient will follow-up cardiology as outpatient --10/4--holding eliquis due to plt count <50K  Intractable nausea -- patient receiving schedule Reglan and PRN Zofran. -- Suspected due to chemo effect. -- Encourage to eat small frequent meals --improving slowly --has been having BM  Diffuse large B-cell lymphoma -- patient follows with Dr. Grayland Ormond -- last cycle chemotherapy was on 29th of September -- monitor accounts -- both daughter and patient do not want any further cycles of chemo.  Sepsis -- as evident by hypotension that responded to IV fluids, tachycardia tachypnea and leukocytosis-- unclear source -- Pro calcitonin elevated --Patient has some cough. Chest x-ray negative for pneumonia p-- empirically on IV meropenem -- urine culture ecoli -blood-culture negative -- continue to monitor and de-escalate antibiotic to po bactrim per c/s --sepsis resolved  Elevated troponin -- suspected due to demand ischemia from rapid a fib. -- Patient  denies any chest pain. -- Has history of CAD--Status post left heart cath in 2019 which showed one-vessel coronary artery disease with occluded proximal RCA with ipsi- and contra- collaterals. Patent stent in first diagonal branch. Normal left ventricular  function. --pt remains in NSR  Hypothyroidism -- continue Synthroid  Depression -- continue Zoloft  Thrombocytopenia -- likely due to chemo  -- plt <50 K--hold eliquis per Dr Grayland Ormond  History of breast cancer Status post radiation therapy and has completed a 5-year course of letrozole.   Diabetes mellitus without complication ([place]) Appears to be diet controlled Maintain consistent carbohydrate diet  Family communication none today Consults : cardiology, oncology CODE STATUS: full DVT Prophylaxis :eliquis Level of care: Telemetry Medical Status is: Inpatient Remains inpatient appropriate because: awaiting rehab bed. Patient is very deconditioned. EDD: unknown  TOTAL TIME TAKING CARE OF THIS PATIENT: 25 minutes.  >50% time spent on counselling and coordination of care  Note: This dictation was prepared with Dragon dictation along with smaller phrase technology. Any transcriptional errors that result from this process are unintentional.  Fritzi Mandes M.D    [facility]   CC: Primary care physician; Rusty Aus, MD

## 2021-12-28 ENCOUNTER — Other Ambulatory Visit: Payer: Self-pay | Admitting: Oncology

## 2021-12-28 DIAGNOSIS — I4891 Unspecified atrial fibrillation: Secondary | ICD-10-CM | POA: Diagnosis not present

## 2021-12-28 LAB — PLATELET COUNT: Platelets: 65 10*3/uL — ABNORMAL LOW (ref 150–400)

## 2021-12-28 LAB — CULTURE, BLOOD (ROUTINE X 2)
Culture: NO GROWTH
Culture: NO GROWTH
Special Requests: ADEQUATE
Special Requests: ADEQUATE

## 2021-12-28 LAB — GLUCOSE, CAPILLARY: Glucose-Capillary: 99 mg/dL (ref 70–99)

## 2021-12-28 MED ORDER — APIXABAN 5 MG PO TABS
5.0000 mg | ORAL_TABLET | Freq: Two times a day (BID) | ORAL | Status: DC
  Administered 2021-12-28 – 2021-12-29 (×3): 5 mg via ORAL
  Filled 2021-12-28 (×3): qty 1

## 2021-12-28 NOTE — Progress Notes (Signed)
[place] at [place] NAME: Stacey Stone    MR#:  [id number]  DATE OF BIRTH:  1945-09-26  SUBJECTIVE:  patient now in sinus rhythm.   Eating better, feels better  Gets fatigued easily--but progressing well  VITALS:  Blood pressure 109/70, pulse 64, temperature 97.8 F (36.6 C), temperature source Oral, resp. rate 18, height '5\' 2"'$  (1.575 m), weight 71.2 kg, SpO2 98 %.  PHYSICAL EXAMINATION:   GENERAL:  76 y.o.-year-old patient lying in the bed with no acute distress.alopecia ,weak LUNGS: Normal breath sounds bilaterally CARDIOVASCULAR: S1, S2 normal. No murmurs ABDOMEN: Soft, nontender, nondistended. Bowel sounds present.  EXTREMITIES: No  edema b/l.    NEUROLOGIC: nonfocal  patient is alert and awake, weak, deconditioned  LABORATORY PANEL:  CBC Recent Labs  Lab 12/24/21 0627 12/28/21 1020  WBC 0.3*  --   HGB 7.8*  --   HCT 23.0*  --   PLT 30* 65*     Chemistries  Recent Labs  Lab 12/24/21 0627  NA 130*  K 3.4*  CL 96*  CO2 26  GLUCOSE 91  BUN 9  CREATININE 0.52  CALCIUM 8.1*  MG 1.8    Cardiac Enzymes No results for input(s): "TROPONINI" in the last 168 hours. RADIOLOGY:  No results found.  Assessment and Plan  HPI: Stacey Stone is a 76 y.o. female with medical history significant for atrial fibrillation on chronic anticoagulation therapy with Eliquis, history of breast cancer status post radiation therapy and recently completed a 5-year course of letrozole, coronary artery disease, stage III diffuse large B-cell lymphoma currently on chemotherapy with last treatment on 12/17/21, history of recurrent UTI on daily prophylactic therapy with Levaquin, history of hypotension who presents to the ER by EMS for evaluation of bilateral jaw pain with radiation to her left shoulder and left anterior chest wall which started about 5 AM on the day of admission.  Per patient she usually feels this way when her  heart rate goes up.  She called EMS and was noted to be tachycardic with heart rates in the 200's.  Chest x-ray reviewed by me shows no evidence of active cardiopulmonary disease CT scan of abdomen and pelvis shows bladder with wall thickening and irregularity and subtle adjacent inflammation consistent with cystitis. No other acute abnormality within the abdomen or pelvis. Aortic atherosclerosis.  A fib with RVR -- patient has known history of atrial fibrillation on chronic anticoagulation -- heart rate much improved after IV amiodarone drip. Seen by Dr. Neoma Laming -- Patient's primary cardiologist is Dr. Saralyn Pilar-- patient currently in sinus rhythm. Will switch her back to sotalol and resume eliquis. Discontinue heparin drip. -- Patient will follow-up cardiology as outpatient --10/4--holding eliquis due to plt count <50K --10/9--plt count 60K-- resume elquis  Intractable nausea -- patient receiving schedule Reglan and PRN Zofran. -- Suspected due to chemo effect. -- Encourage to eat small frequent meals --improving slowly --resolved  Diffuse large B-cell lymphoma -- patient follows with Dr. Grayland Ormond -- last cycle chemotherapy was on 29th of September -- monitor accounts -- both daughter and patient do not want any further cycles of chemo.  Sepsis -- as evident by hypotension that responded to IV fluids, tachycardia tachypnea and leukocytosis-- unclear source -- Pro calcitonin elevated --Patient has some cough. Chest x-ray negative for pneumonia p-- empirically on IV meropenem -- urine culture ecoli -blood-culture negative -- continue to monitor and de-escalate antibiotic to po bactrim per c/s --sepsis  resolved  Elevated troponin -- suspected due to demand ischemia from rapid a fib. -- Patient denies any chest pain. -- Has history of CAD--Status post left heart cath in 2019 which showed one-vessel coronary artery disease with occluded proximal RCA with ipsi- and contra-  collaterals. Patent stent in first diagonal branch. Normal left ventricular function. --pt remains in NSR  Hypothyroidism -- continue Synthroid  Depression -- continue Zoloft  Thrombocytopenia -- likely due to chemo  -- plt <50 K--hold eliquis per Dr Grayland Ormond  History of breast cancer Status post radiation therapy and has completed a 5-year course of letrozole.   Diabetes mellitus without complication ([place]) Appears to be diet controlled Maintain consistent carbohydrate diet  Family communication none today Consults : cardiology, oncology CODE STATUS: full DVT Prophylaxis :eliquis Level of care: Telemetry Medical Status is: Inpatient Remains inpatient appropriate because: awaiting rehab bed. Patient is  deconditioned. EDD: unknown  TOTAL TIME TAKING CARE OF THIS PATIENT: 25 minutes.  >50% time spent on counselling and coordination of care  Note: This dictation was prepared with Dragon dictation along with smaller phrase technology. Any transcriptional errors that result from this process are unintentional.  Fritzi Mandes M.D    [facility]   CC: Primary care physician; Rusty Aus, MD

## 2021-12-28 NOTE — Consult Note (Signed)
[place]  [phone] [phone]) [phone]  ID: [name] OB: September 20, 1945  MR#: [id number]  CSN#:[id number]  Patient Care Team: Rusty Aus, MD as PCP - General (Internal Medicine) Isaias Cowman, MD as Consulting Physician (Cardiology) Lloyd Huger, MD as Consulting Physician (Oncology)  CHIEF COMPLAINT: Diffuse large B-cell lymphoma, pancytopenia, atrial fibrillation.  INTERVAL HISTORY: Patient is a 76 year old female who was admitted to the hospital last week with atrial fibrillation.  She is now back in sinus rhythm.  She also had sepsis-like symptoms, but blood cultures are negative and patient remained afebrile.  She has persistent weakness and fatigue, but otherwise feels nearly back to her baseline.  She has no neurologic complaints.  She denies any chest pain, shortness of breath, cough, or hemoptysis.  She denies any nausea, vomiting, constipation, or diarrhea.  She has no urinary complaints.  Patient offers no further specific complaints today.  REVIEW OF SYSTEMS:   Review of Systems  Constitutional:  Positive for malaise/fatigue. Negative for fever and weight loss.  Respiratory: Negative.  Negative for cough, hemoptysis and shortness of breath.   Cardiovascular: Negative.  Negative for chest pain and leg swelling.  Gastrointestinal: Negative.  Negative for abdominal pain and nausea.  Genitourinary: Negative.  Negative for dysuria.  Musculoskeletal: Negative.  Negative for back pain.  Skin: Negative.  Negative for rash.  Neurological:  Positive for focal weakness. Negative for dizziness, speech change and headaches.  Psychiatric/Behavioral: Negative.  The patient is not nervous/anxious.     As per HPI. Otherwise, a complete review of systems is negative.  PAST MEDICAL HISTORY: Past Medical History:  Diagnosis Date   Abdominal pain, left lower quadrant    epiploic appendagitis   Anginal pain ([place])    Arthritis  06/06/2018   knee   Asthma    mild   Atrial fibrillation (HCC)    Breast cancer ([place])    Coronary artery disease 06/06/2018   1 artery 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 Mamie Nick. 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   No pertinent past medical history    Pancytopenia ([place])    Personal history of radiation therapy    Sleep apnea 06/06/2018   O2- 2l at bedtime and BiPap @ bedtime    PAST SURGICAL HISTORY: Past Surgical History:  Procedure Laterality Date   BREAST BIOPSY Left 2/136/2018   INVASIVE MAMMARY CARCINOMA   BREAST BIOPSY Right 05/25/2016   FIBROCYSTIC CHANGE WITH CALCIFICATIONS    BREAST BIOPSY Right 05/29/2020   Affirm bx-"X" clip-FIBROADENOMA WITH ASSOCIATED CALCIFICATIONS. - NEGATIVE   BREAST EXCISIONAL BIOPSY Left 06/04/2016   INVASIVE MAMMARY CARCINOMA.    BREAST LUMPECTOMY Left 06/04/2016   INVASIVE MAMMARY CARCINOMA.    CARDIAC CATHETERIZATION     CARDIAC CATHETERIZATION N/A 09/24/2015   Procedure: Left Heart Cath and Coronary Angiography;  Surgeon: Isaias Cowman, MD;  Location: [facility];  Service: Cardiovascular;  Laterality: N/A;   CARDIAC CATHETERIZATION N/A 09/24/2015   Procedure: Coronary Stent Intervention;  Surgeon: Isaias Cowman, MD;  Location: [facility];  Service: Cardiovascular;  Laterality: N/A;   CHOLECYSTECTOMY  11/26   08/1970   COLONOSCOPY WITH PROPOFOL N/A 07/08/2021  Procedure: COLONOSCOPY WITH PROPOFOL;  Surgeon: Toledo, Benay Pike, MD;  Location: [facility];  Service: Gastroenterology;  Laterality: N/A;   DILATATION & CURETTAGE/HYSTEROSCOPY WITH MYOSURE N/A 05/05/2015   Procedure: Hysteroscopy and endometrial curretage;  Surgeon:  Boykin Nearing, MD;  Location: [facility];  Service: Gynecology;  Laterality: N/A;   DILATION AND CURETTAGE OF UTERUS     EXCISION MASS FROM NECK  Right    Dr. Tami Ribas   EYE SURGERY  05/29/2020   bil cataract 9/08   HAMMER TOE SURGERY Right    IR BONE MARROW BIOPSY & ASPIRATION  09/09/2021   IR IMAGING GUIDED PORT INSERTION  09/09/2021   JOINT REPLACEMENT Bilateral 2008, 11/26/ 2012   Partial Knee Replacements   LEFT HEART CATH AND CORONARY ANGIOGRAPHY N/A 10/25/2017   Procedure: LEFT HEART CATH AND CORONARY ANGIOGRAPHY;  Surgeon: Teodoro Spray, MD;  Location: [facility];  Service: Cardiovascular;  Laterality: N/A;   LEFT HEART CATH AND CORONARY ANGIOGRAPHY N/A 10/26/2017   Procedure: LEFT HEART CATH AND CORONARY ANGIOGRAPHY;  Surgeon: Isaias Cowman, MD;  Location: [facility];  Service: Cardiovascular;  Laterality: N/A;   PARTIAL MASTECTOMY WITH NEEDLE LOCALIZATION Left 06/04/2016   Procedure: PARTIAL MASTECTOMY WITH NEEDLE LOCALIZATION;  Surgeon: Leonie Green, MD;  Location: [facility];  Service: General;  Laterality: Left;   SCLERAL BUCKLE  02/16/2011   Procedure: SCLERAL BUCKLE;  Surgeon: Hayden Pedro, MD;  Location: [facility];  Service: Ophthalmology;  Laterality: Left;  Scleral Buckle Left Eye with Headscope Laser   SENTINEL NODE BIOPSY Left 06/04/2016   Procedure: SENTINEL NODE BIOPSY;  Surgeon: Leonie Green, MD;  Location: [facility];  Service: General;  Laterality: Left;    FAMILY HISTORY: Family History  Problem Relation Age of Onset   Breast cancer Mother 4   Heart disease Father    Breast cancer Paternal Aunt 61   Breast cancer Cousin    Anesthesia problems Neg Hx    Hypotension Neg Hx    Malignant hyperthermia Neg Hx    Pseudochol deficiency Neg Hx     ADVANCED DIRECTIVES (Y/N):  _0 @  HEALTH MAINTENANCE: Social History   Tobacco Use   Smoking status: Never   Smokeless tobacco: Never  Vaping Use   Vaping Use: Never  used  Substance Use Topics   Alcohol use: No   Drug use: No     Colonoscopy:  PAP:  Bone density:  Lipid panel:  Allergies  Allergen Reactions   Codeine Other (See Comments)    Stroke like symptoms   Tramadol Itching    Current Facility-Administered Medications  Medication Dose Route Frequency Provider Last Rate Last Admin   acetaminophen (TYLENOL) tablet 500 mg  500 mg Oral Q4H PRN Agbata, Tochukwu, MD   500 mg at 12/22/21 2139   atorvastatin (LIPITOR) tablet 40 mg  40 mg Oral QHS Agbata, Tochukwu, MD   40 mg at 12/27/21 2105   Chlorhexidine Gluconate Cloth 2 % PADS 6 each  6 each Topical Daily Fritzi Mandes, MD   6 each at 12/28/21 1028   cyanocobalamin (VITAMIN B12) tablet 1,000 mcg  1,000 mcg Oral Daily Agbata, Tochukwu, MD   1,000 mcg at 12/28/21 1029   feeding supplement (GLUCERNA SHAKE) (GLUCERNA SHAKE) liquid 237 mL  237 mL Oral TID BM Fritzi Mandes, MD   237 mL at 12/27/21 0907   ferrous sulfate tablet 325 mg  325 mg Oral Q breakfast Agbata, Tochukwu, MD   325  mg at 12/28/21 1029   levothyroxine (SYNTHROID) tablet 125 mcg  125 mcg Oral Q0600 Collier Bullock, MD   125 mcg at 12/28/21 0621   loratadine (CLARITIN) tablet 10 mg  10 mg Oral Daily Agbata, Tochukwu, MD   10 mg at 12/28/21 1029   magnesium oxide (MAG-OX) tablet 400 mg  400 mg Oral BID Fritzi Mandes, MD   400 mg at 12/28/21 1029   melatonin tablet 5 mg  5 mg Oral QHS Sharion Settler, NP   5 mg at 12/27/21 2105   metoCLOPramide (REGLAN) tablet 5 mg  5 mg Oral TID Eldridge Dace, MD   5 mg at 12/28/21 1029   mometasone-formoterol (DULERA) 100-5 MCG/ACT inhaler 2 puff  2 puff Inhalation BID Agbata, Tochukwu, MD       multivitamin with minerals tablet 1 tablet  1 tablet Oral Daily Fritzi Mandes, MD   1 tablet at 12/28/21 1029   nitroGLYCERIN (NITROSTAT) SL tablet 0.4 mg  0.4 mg Sublingual Q5 min PRN Agbata, Tochukwu, MD       ondansetron (ZOFRAN) tablet 4 mg  4 mg Oral Q4H PRN Fritzi Mandes, MD       Or   ondansetron [facility])  injection 4 mg  4 mg Intravenous Q6H PRN Fritzi Mandes, MD   4 mg at 12/24/21 1030   pantoprazole (PROTONIX) EC tablet 40 mg  40 mg Oral Daily Agbata, Tochukwu, MD   40 mg at 12/28/21 1029   sertraline (ZOLOFT) tablet 50 mg  50 mg Oral QHS Agbata, Tochukwu, MD   50 mg at 12/27/21 2104   sotalol (BETAPACE) tablet 80 mg  80 mg Oral Q12H Fritzi Mandes, MD   80 mg at 12/28/21 1029   Facility-Administered Medications Ordered in Other Encounters  Medication Dose Route Frequency Provider Last Rate Last Admin   acetaminophen (TYLENOL) 325 MG tablet            diphenhydrAMINE (BENADRYL) 25 mg capsule            heparin lock flush 100 UNIT/ML injection            heparin lock flush 100 UNIT/ML injection            palonosetron (ALOXI) 0.25 MG/5ML injection             OBJECTIVE: Vitals:   12/28/21 0454 12/28/21 0835  BP: 125/65 109/70  Pulse: 65 64  Resp: 18 18  Temp: 97.6 F (36.4 C) 97.8 F (36.6 C)  SpO2: 98% 98%     Body mass index is 28.72 kg/m.    ECOG FS:1 - Symptomatic but completely ambulatory  General: Well-developed, well-nourished, no acute distress. Eyes: Pink conjunctiva, anicteric sclera. HEENT: Normocephalic, moist mucous membranes. Lungs: No audible wheezing or coughing. Heart: Regular rate and rhythm. Abdomen: Soft, nontender, no obvious distention. Musculoskeletal: No edema, cyanosis, or clubbing. Neuro: Alert, answering all questions appropriately. Cranial nerves grossly intact. Skin: No rashes or petechiae noted. Psych: Normal affect.  LAB RESULTS:  Lab Results  Component Value Date   NA 130 (L) 12/24/2021   K 3.4 (L) 12/24/2021   CL 96 (L) 12/24/2021   CO2 26 12/24/2021   GLUCOSE 91 12/24/2021   BUN 9 12/24/2021   CREATININE 0.52 12/24/2021   CALCIUM 8.1 (L) 12/24/2021   PROT 5.0 (L) 12/20/2021   ALBUMIN 2.4 (L) 12/20/2021   AST 111 (H) 12/20/2021   ALT 67 (H) 12/20/2021   ALKPHOS 165 (H) 12/20/2021   BILITOT 1.1 12/20/2021  GFRNONAA >60 12/24/2021    GFRAA 59 (L) 06/14/2019    Lab Results  Component Value Date   WBC 0.3 (LL) 12/24/2021   NEUTROABS 15.4 (H) 12/20/2021   HGB 7.8 (L) 12/24/2021   HCT 23.0 (L) 12/24/2021   MCV 91.3 12/24/2021   PLT 65 (L) 12/28/2021     STUDIES: US Abdomen Limited RUQ (LIVER/GB)  Result Date: 12/20/2021 CLINICAL DATA:  Transaminitis EXAM: ULTRASOUND ABDOMEN LIMITED RIGHT UPPER QUADRANT COMPARISON:  CT abdomen pelvis, 12/20/2021 FINDINGS: Gallbladder: Status post cholecystectomy. Gallbladder fossa fluid described by prior CT is not appreciated by ultrasound. Common bile duct: Diameter: 0.7 cm Liver: No focal lesion identified. Increased parenchymal echogenicity. Portal vein is patent on color Doppler imaging with normal direction of blood flow towards the liver. Other: None. IMPRESSION: 1. Status post cholecystectomy. Gallbladder fossa fluid described by prior CT is not appreciated by ultrasound. 2. Hepatic steatosis. Electronically Signed   By: Delanna Ahmadi M.D.   On: 12/20/2021 14:01   CT Abdomen Pelvis W Contrast  Result Date: 12/20/2021 CLINICAL DATA:  Sepsis. EXAM: CT ABDOMEN AND PELVIS WITH CONTRAST TECHNIQUE: Multidetector CT imaging of the abdomen and pelvis was performed using the standard protocol following bolus administration of intravenous contrast. RADIATION DOSE REDUCTION: This exam was performed according to the departmental dose-optimization program which includes automated exposure control, adjustment of the mA and/or kV according to patient size and/or use of iterative reconstruction technique. CONTRAST:  136m OMNIPAQUE IOHEXOL 300 MG/ML  SOLN COMPARISON:  01/06/2021. FINDINGS: Lower chest: No acute findings. Hepatobiliary: Normal liver. Small amount of fluid attenuation along the gallbladder fossa. Common bile duct measures 7 mm proximally with normal distal tapering. Pancreas: Unremarkable. No pancreatic ductal dilatation or surrounding inflammatory changes. Spleen: Normal in size without  focal abnormality. Adrenals/Urinary Tract: No adrenal masses. Kidneys show symmetric enhancement. 1.4 cm low-attenuation left mid pole cortical mass consistent with a cyst, stable. No follow-up recommended no renal stones. No hydronephrosis. Ureters are normal in course and in caliber. Bladder mildly distended. Wall is mildly thickened and irregular with subtle adjacent inflammatory haziness. No bladder mass or stone. . Stomach/Bowel: Normal stomach. Small bowel and colon are normal in caliber. No wall thickening. No inflammation. Vascular/Lymphatic: Aortic atherosclerosis. No aneurysm. No enlarged lymph nodes. Reproductive: Prominent postmenopausal uterus. 1.6 cm fibroid. No adnexal masses. Other: No abdominal wall hernia or abnormality. No abdominopelvic ascites. Musculoskeletal: No fracture or acute finding.  No bone lesion. IMPRESSION: 1. Bladder with wall thickening and irregularity and subtle adjacent inflammation consistent with cystitis. 2. No other acute abnormality within the abdomen or pelvis. 3. Aortic atherosclerosis. Electronically Signed   By: DLajean ManesM.D.   On: 12/20/2021 10:05   DG Chest Port 1 View  Result Date: 12/20/2021 CLINICAL DATA:  Chest pain and SOB. Elevated heart rate. B-cell lymphoma. EXAM: PORTABLE CHEST 1 VIEW COMPARISON:  11/25/2021 FINDINGS: There is a right chest wall port a catheter with tip at the cavoatrial junction. Heart size and mediastinal contours are unremarkable. There is no pleural effusion or edema identified. No airspace opacities. IMPRESSION: No active disease. Electronically Signed   By: TKerby MoorsM.D.   On: 12/20/2021 07:43   NM PET Image Restag (PS) Skull Base To Thigh  Result Date: 12/09/2021 CLINICAL DATA:  Subsequent treatment strategy for diffuse large B-cell lymphoma. EXAM: NUCLEAR MEDICINE PET SKULL BASE TO THIGH TECHNIQUE: 8.8 mCi F-18 FDG was injected intravenously. Full-ring PET imaging was performed from the skull base to thigh after the  radiotracer.  CT data was obtained and used for attenuation correction and anatomic localization. Fasting blood glucose: 110 mg/dl COMPARISON:  PET-CT 08/18/2021 FINDINGS: Mediastinal blood pool activity: SUV max 1.6 Liver activity: SUV max 3.2 NECK: Previously enlarged and hypermetabolic RIGHT cervical and supraclavicular nodes are no longer measurable. No residual metabolic activity. There is intense residual metabolic activity associated mucosal thickening in the anterior RIGHT maxillary sinus with SUV max equal 16 compared SUV max equal 23. Lesion measures 13 x 6 mm (image 18/2) compared to 17 x 9 mm. Incidental CT findings: None. CHEST: Interval resolution LEFT size and metabolic activity of mediastinal lymph nodes. No residual enlarged or metabolically active mediastinal lymph nodes. Previously seen RIGHT lower lobe peribronchial nodule measures 19 mm (84/series 2) compared to 27 mm. This nodule has low metabolic activity with SUV max equal 1.9 decreased from SUV max equal 15.8. Incidental CT findings: Port in the anterior chest wall with tip in distal SVC. ABDOMEN/PELVIS: Interval resolution of the previously enlarged and hypermetabolic periaortic lymph nodes. No residual adenopathy. For example 5 mm LEFT periaortic node decreased from 18 mm with SUV max equal 0.8 decreased from SUV max equal 14.8. Normal spleen with normal radiotracer activity. Resolution of focal activity. Incidental CT findings: Atherosclerotic calcification of the aorta. Uterus and adnexa unremarkable. SKELETON: Uniform mild increase in marrow activity without focality. Incidental CT findings: None. IMPRESSION: 1. Marked reduction in size and metabolic activity of previous seen cervical, supraclavicular, mediastinal improved retroperitoneal nodes. No residual adenopathy or metabolic activity. [place] 1 2. RIGHT lobe pulmonary nodule with very low metabolic activity and decreased in size. [place] 2 3. Mild uniform increase in marrow  activity is consistent with a GCSF type marrow response. 4. Intense uptake within the polypoid lesion in the anterior RIGHT maxillary sinus. Lesion decreased slightly in size and metabolic activity. Favor lesion unrelated to lymphoma as such a dramatic response at other sites of disease as well as atypical location. Probable inflammatory or neoplastic polypoid lesion. Recommend MRI of the sinuses with contrast for further evaluation. These results will be called to the ordering clinician or representative by the Radiologist Assistant, and communication documented in the PACS or [employer]. Electronically Signed   By: Suzy Bouchard M.D.   On: 12/09/2021 11:53   ECHOCARDIOGRAM COMPLETE  Result Date: 12/01/2021    ECHOCARDIOGRAM REPORT   Patient Name:   [name] Date of Exam: 12/01/2021 Medical Rec #:  [id number]            Height:       62.0 in Accession #:    [id number]           Weight:       161.4 lb Date of Birth:  03/16/1946           BSA:          1.745 m Patient Age:    16 years             BP:           125/82 mmHg Patient Gender: F                    HR:           81 bpm. Exam Location:  [facility] Procedure: 2D Echo, Cardiac Doppler, Color Doppler and Strain Analysis Indications:     Chemo Z09  History:         Patient has prior history of Echocardiogram examinations, most  recent 10/24/2017. Arrythmias:Atrial Fibrillation; Risk                  Factors:Hypertension.  Sonographer:     Sherrie Sport Referring Phys:  [place] Diagnosing Phys: Isaias Cowman MD  Sonographer Comments: Global longitudinal strain was attempted. IMPRESSIONS  1. Left ventricular ejection fraction, by estimation, is 60 to 65%. The left ventricle has normal function. The left ventricle has no regional wall motion abnormalities. There is mild left ventricular hypertrophy. Left ventricular diastolic parameters were normal.  2. Right ventricular systolic function is normal. The right  ventricular size is normal.  3. The mitral valve is normal in structure. Mild mitral valve regurgitation. No evidence of mitral stenosis.  4. The aortic valve is normal in structure. Aortic valve regurgitation is mild. No aortic stenosis is present.  5. The inferior vena cava is normal in size with greater than 50% respiratory variability, suggesting right atrial pressure of 3 mmHg. FINDINGS  Left Ventricle: Left ventricular ejection fraction, by estimation, is 60 to 65%. The left ventricle has normal function. The left ventricle has no regional wall motion abnormalities. The left ventricular internal cavity size was normal in size. There is  mild left ventricular hypertrophy. Left ventricular diastolic parameters were normal. Right Ventricle: The right ventricular size is normal. No increase in right ventricular wall thickness. Right ventricular systolic function is normal. Left Atrium: Left atrial size was normal in size. Right Atrium: Right atrial size was normal in size. Pericardium: There is no evidence of pericardial effusion. Mitral Valve: The mitral valve is normal in structure. Mild mitral valve regurgitation. No evidence of mitral valve stenosis. Tricuspid Valve: The tricuspid valve is normal in structure. Tricuspid valve regurgitation is mild . No evidence of tricuspid stenosis. Aortic Valve: The aortic valve is normal in structure. Aortic valve regurgitation is mild. No aortic stenosis is present. Aortic valve mean gradient measures 4.0 mmHg. Aortic valve peak gradient measures 7.0 mmHg. Aortic valve area, by VTI measures 2.70 cm. Pulmonic Valve: The pulmonic valve was normal in structure. Pulmonic valve regurgitation is not visualized. No evidence of pulmonic stenosis. Aorta: The aortic root is normal in size and structure. Venous: The inferior vena cava is normal in size with greater than 50% respiratory variability, suggesting right atrial pressure of 3 mmHg. IAS/Shunts: No atrial level shunt  detected by color flow Doppler.  LEFT VENTRICLE PLAX 2D LVOT diam:     2.00 cm   Diastology LV SV:         52        LV e' medial:    6.20 cm/s LV SV Index:   30        LV E/e' medial:  8.3 LVOT Area:     3.14 cm  LV e' lateral:   6.64 cm/s                          LV E/e' lateral: 7.7                           3D Volume EF:                          3D EF:        53 %  LV EDV:       73 ml                          LV ESV:       34 ml                          LV SV:        39 ml RIGHT VENTRICLE RV Basal diam:  3.10 cm RV S prime:     15.40 cm/s TAPSE (M-mode): 2.2 cm LEFT ATRIUM           Index        RIGHT ATRIUM           Index LA Vol (A2C): 49.7 ml 28.48 ml/m  RA Area:     13.10 cm LA Vol (A4C): 37.8 ml 21.66 ml/m  RA Volume:   31.90 ml  18.28 ml/m  AORTIC VALVE AV Area (Vmax):    2.24 cm AV Area (Vmean):   2.37 cm AV Area (VTI):     2.70 cm AV Vmax:           132.00 cm/s AV Vmean:          83.500 cm/s AV VTI:            0.191 m AV Peak Grad:      7.0 mmHg AV Mean Grad:      4.0 mmHg LVOT Vmax:         94.00 cm/s LVOT Vmean:        63.000 cm/s LVOT VTI:          0.164 m LVOT/AV VTI ratio: 0.86  AORTA Ao Root diam: 3.33 cm MITRAL VALVE               TRICUSPID VALVE MV Area (PHT): 3.66 cm    TR Peak grad:   21.0 mmHg MV Decel Time: 207 msec    TR Vmax:        229.00 cm/s MV E velocity: 51.40 cm/s MV A velocity: 81.80 cm/s  SHUNTS MV E/A ratio:  0.63        Systemic VTI:  0.16 m                            Systemic Diam: 2.00 cm Isaias Cowman MD Electronically signed by Isaias Cowman MD Signature Date/Time: 12/01/2021/10:58:41 AM    Final     ASSESSMENT: Diffuse large B-cell lymphoma, pancytopenia, atrial fibrillation.  PLAN:    Diffuse large B-cell lymphoma: Patient received chemotherapy approximately 1 week ago.  Given her difficulties, will cancel what was to be her final treatment on January 07, 2022.  She has been instructed to keep this appointment as hospital  follow-up with laboratory work only.  No further treatments are planned. Pancytopenia: Secondary to chemotherapy.  Improving. Thrombocytopenia: Patient's platelet count is 65.  Okay to restart Eliquis. Atrial fibrillation: Patient now on self normal sinus rhythm.  Okay to restart Eliquis as above. Sepsis syndrome: Resolved. Disposition: Okay to discharge from an oncology standpoint.  Patient reports she is awaiting bed placement in rehab.  Follow-up in the cancer center as above.  Appreciate consult, will follow.  Lloyd Huger, MD   12/28/2021 11:06 AM

## 2021-12-28 NOTE — Progress Notes (Signed)
   12/28/21 1400  Clinical Encounter Type  Visited With Patient  Visit Type Initial  Referral From Chaplain  Consult/Referral To [facility] supported patient in her transition to re-hab. Patient shared her journey with Cancer and her current realities. Patient requested prayer.

## 2021-12-28 NOTE — Progress Notes (Signed)
Physical Therapy Treatment Patient Details Name: Stacey Stone MRN: [id number] DOB: [birth year] Today's Date: 12/28/2021   History of Present Illness Pt is a 76 year old female admitted with a fib with RVR after presenting to ED with bilateral jaw pain with radiation to her left shoulder and left anterior chest wall; PMH significant for  atrial fibrillation on chronic anticoagulation therapy with Eliquis, history of breast cancer status post radiation therapy and recently completed a 5-year course of letrozole, coronary artery disease, stage III diffuse large B-cell lymphoma currently on chemotherapy with last treatment    PT Comments    Pt is making gradual progress towards goals with ability to ambulate laps in room with RW. Appears to have more energy this date, however still becomes fatigued quickly. Able to perform warm up there-ex at EOB prior to to mobility attempts. Removed purewick and encouraged pt to begin using bathroom to improve endurance/mobility. Session shortened as breakfast tray arrived, assisted with sitting in recliner and opening cups prior to leaving room. Will continue to progress.  Recommendations for follow up therapy are one component of a multi-disciplinary discharge planning process, led by the attending physician.  Recommendations may be updated based on patient status, additional functional criteria and insurance authorization.  Follow Up Recommendations  Skilled nursing-short term rehab (<3 hours/day) Can patient physically be transported by private vehicle: Yes   Assistance Recommended at Discharge Intermittent Supervision/Assistance  Patient can return home with the following A little help with walking and/or transfers;A little help with bathing/dressing/bathroom;Assistance with cooking/housework;Assist for transportation;Help with stairs or ramp for entrance   Equipment Recommendations  None recommended by PT    Recommendations for Other Services        Precautions / Restrictions Precautions Precautions: Fall Restrictions Weight Bearing Restrictions: No     Mobility  Bed Mobility Overal bed mobility: Needs Assistance Bed Mobility: Supine to Sit, Sit to Supine     Supine to sit: Modified independent (Device/Increase time)     General bed mobility comments: improved ease of effort. Once seated, able to sit with upright posture    Transfers Overall transfer level: Needs assistance Equipment used: Rolling walker (2 wheels) Transfers: Sit to/from Stand Sit to Stand: Supervision           General transfer comment: upright posture. RW used    Ambulation/Gait Ambulation/Gait assistance: Counsellor (Feet): 50 Feet Assistive device: Rolling walker (2 wheels) Gait Pattern/deviations: Decreased step length - right, Decreased step length - left, Decreased stride length       General Gait Details: pt preferred to stay in room, able to perform 2 laps with RW. CGA given and safe technique   Stairs             Wheelchair Mobility    Modified Rankin (Stroke Patients Only)       Balance Overall balance assessment: Needs assistance Sitting-balance support: Feet supported, Bilateral upper extremity supported Sitting balance-Leahy Scale: Good     Standing balance support: During functional activity, Single extremity supported Standing balance-Leahy Scale: Fair                              Cognition Arousal/Alertness: Awake/alert Behavior During Therapy: WFL for tasks assessed/performed Overall Cognitive Status: Within Functional Limits for tasks assessed  General Comments: very pleasant lady        Exercises Other Exercises Other Exercises: seated ther-ex performed on B LE including LAQ, alt marching and AP. 10 reps with supervision.    General Comments        Pertinent Vitals/Pain Pain Assessment Pain Assessment: No/denies  pain    Home Living                          Prior Function            PT Goals (current goals can now be found in the care plan section) Acute Rehab PT Goals Patient Stated Goal: to get back to normal PT Goal Formulation: With patient Time For Goal Achievement: 01/08/22 Potential to Achieve Goals: Good Progress towards PT goals: Progressing toward goals    Frequency    Min 2X/week      PT Plan Current plan remains appropriate    Co-evaluation              AM-PAC PT "6 Clicks" Mobility   Outcome Measure  Help needed turning from your back to your side while in a flat bed without using bedrails?: None Help needed moving from lying on your back to sitting on the side of a flat bed without using bedrails?: None Help needed moving to and from a bed to a chair (including a wheelchair)?: A Little Help needed standing up from a chair using your arms (e.g., wheelchair or bedside chair)?: A Little Help needed to walk in hospital room?: A Little Help needed climbing 3-5 steps with a railing? : A Lot 6 Click Score: 19    End of Session Equipment Utilized During Treatment: Gait belt Activity Tolerance: Patient limited by fatigue Patient left: in chair;with chair alarm set Nurse Communication: Mobility status PT Visit Diagnosis: Muscle weakness (generalized) (M62.81);Unsteadiness on feet (R26.81);Other abnormalities of gait and mobility (R26.89);Difficulty in walking, not elsewhere classified (R26.2)     Time: 3536-1443 PT Time Calculation (min) (ACUTE ONLY): 12 min  Charges:  $Gait Training: 8-22 mins                     Greggory Stallion, PT, DPT, GCS [phone]    Darci Lykins 12/28/2021, 11:39 AM

## 2021-12-28 NOTE — Progress Notes (Signed)
Nutrition Follow-up  DOCUMENTATION CODES:   Not applicable  INTERVENTION:  Encourage adequate PO intake Continue Glucerna Shake po TID, each supplement provides 220 kcal and 10 grams of protein Continue MVI with minerals daily  NUTRITION DIAGNOSIS:   Increased nutrient needs related to cancer and cancer related treatments as evidenced by estimated needs.  Ongoing  GOAL:   Patient will meet greater than or equal to 90% of their needs  Goal unmet  MONITOR:   PO intake, Supplement acceptance  REASON FOR ASSESSMENT:   Malnutrition Screening Tool    ASSESSMENT:   Pt with medical history significant for atrial fibrillation, history of breast cancer status post radiation therapy, coronary artery disease, stage III diffuse large B-cell lymphoma currently on chemotherapy, recurrent UTI, hypotension who presents for evaluation of bilateral jaw pain with radiation to her left shoulder and left anterior chest wall  Pt medically stable for d/c to SNF. Pending placement.   Spoke with pt at bedside. She reports ongoing poor appetite however she continues to try to eat a small bit from each meal. She has not been drinking the Glucerna shakes as she experienced dry heaves while consuming one the other day. She plans to resume these supplements once she discharges to SNF.   Encourage pt to continue to eat smaller meals/snacks more frequently throughout the day with continued use of nutrition supplements as tolerated.   Medications: vitamin B12, ferrous sulfate, synthroid, mag-ox, melatonin, reglan, MVI, protonix   Labs: CBG 99, 98 x24 hours  UOP: 1446m x24 hours I/O's: -57640msince admit  Diet Order:   Diet Order             Diet Carb Modified Fluid consistency: Thin; Room service appropriate? Yes  Diet effective now                   EDUCATION NEEDS:   Education needs have been addressed  Skin:  Skin Assessment: Reviewed RN Assessment  Last BM:  10/6  Height:    Ht Readings from Last 1 Encounters:  12/20/21 '5\' 2"'$  (1.575 m)    Weight:   Wt Readings from Last 1 Encounters:  12/20/21 71.2 kg    Ideal Body Weight:  50 kg  BMI:  Body mass index is 28.72 kg/m.  Estimated Nutritional Needs:   Kcal:  1500-1700  Protein:  75-90 grams  Fluid:  > 1.5 L  AlClayborne DanaRDN, LDN Clinical Nutrition

## 2021-12-29 DIAGNOSIS — I4891 Unspecified atrial fibrillation: Secondary | ICD-10-CM | POA: Diagnosis not present

## 2021-12-29 LAB — GLUCOSE, CAPILLARY: Glucose-Capillary: 95 mg/dL (ref 70–99)

## 2021-12-29 MED ORDER — MAGNESIUM OXIDE -MG SUPPLEMENT 400 (240 MG) MG PO TABS: 400.0000 mg | ORAL_TABLET | Freq: Two times a day (BID) | ORAL | 0 refills | Status: DC

## 2021-12-29 MED ORDER — ADULT MULTIVITAMIN W/MINERALS CH: 1.0000 | ORAL_TABLET | Freq: Every day | ORAL | 0 refills | Status: DC

## 2021-12-29 MED ORDER — GLUCERNA SHAKE PO LIQD: 237.0000 mL | Freq: Three times a day (TID) | ORAL | 0 refills | Status: DC

## 2021-12-29 NOTE — Care Management Important Message (Signed)
Important Message  Patient Details  Name: KEYONDRA LAGRAND MRN: [id number] Date of Birth: 06-14-1945   Medicare Important Message Given:  Yes  Reviewed Medicare IM with patient via room phone due to isolation status.  Confirmed she has the copy of the Medicare IM previously delivered to room to reference.   Dannette Barbara 12/29/2021, 9:41 AM

## 2021-12-29 NOTE — TOC Transition Note (Signed)
Transition of Care [facility]) - CM/SW Discharge Note   Patient Details  Name: Stacey Stone MRN: [id number] Date of Birth: May 20, 1945  [facility] CM/SW Contact:  Alberteen Sam, LCSW Phone Number: 12/29/2021, 8:57 AM   Clinical Narrative:     Patient will DC to: [place] Anticipated DC date: 12/29/21 Transport by: Johnanna Schneiders  Per MD patient ready for DC to  [facility]. RN, patient, patient's family, and facility notified of DC. Discharge Summary sent to facility. RN given number for report  [phone] Room 102. DC packet on chart. Ambulance transport requested for patient.  CSW signing off.  Pricilla Riffle, LCSW    Final next level of care: Skilled Nursing Facility Barriers to Discharge: No Barriers Identified   Patient Goals and CMS Choice Patient states their goals for this hospitalization and ongoing recovery are:: to go home CMS Medicare.gov Compare Post Acute Care list provided to:: Patient Choice offered to / list presented to : Patient  Discharge Placement              Patient chooses bed at: [facility]     Patient and family notified of of transfer: 12/29/21  Discharge Plan and Services                                     Social Determinants of Health (SDOH) Interventions     Readmission Risk Interventions    11/27/2021    2:30 PM 11/09/2021    1:17 PM  Readmission Risk Prevention Plan  Transportation Screening Complete Complete  PCP or Specialist Appt within 3-5 Days  Complete  HRI or [place]  Complete  Social Work Consult for [place] Planning/Counseling  Complete  Palliative Care Screening Not Applicable Not Applicable  Medication Review Press photographer) Complete Complete

## 2021-12-29 NOTE — Progress Notes (Addendum)
Attempted to give report to [facility]  at [phone] several times without success. Left my phone number with receptionist for the nurse to call me back.  Patient picked up by EMS.

## 2021-12-29 NOTE — Discharge Summary (Signed)
Physician Discharge Summary   Patient: Stacey Stone MRN: [id number] DOB: 1945/11/20  Admit date:     12/20/2021  Discharge date: 12/29/21  Discharge Physician: Fritzi Mandes   PCP: Rusty Aus, MD   Recommendations at discharge:     Discharge Diagnoses: Principal Problem:   Atrial fibrillation with rapid ventricular response (HCC) Active Problems:   Sepsis (Colon)   DLBCL (diffuse large B cell lymphoma) (HCC)   Elevated troponin   Hypothyroidism   Diabetes mellitus without complication (HCC)   History of breast cancer   Hypokalemia   Thrombocytopenia (HCC)   Transaminitis   Depression   UTI (urinary tract infection)   Hospital Course:  Stacey Stone is a 76 y.o. female with medical history significant for atrial fibrillation on chronic anticoagulation therapy with Eliquis, history of breast cancer status post radiation therapy and recently completed a 5-year course of letrozole, coronary artery disease, stage III diffuse large B-cell lymphoma currently on chemotherapy with last treatment on 12/17/21, history of recurrent UTI on daily prophylactic therapy with Levaquin, history of hypotension who presents to the ER by EMS for evaluation of bilateral jaw pain with radiation to her left shoulder and left anterior chest wall which started about 5 AM on the day of admission.  Per patient she usually feels this way when her heart rate goes up.  She called EMS and was noted to be tachycardic with heart rates in the 200's.   Chest x-ray reviewed shows no evidence of active cardiopulmonary disease CT scan of abdomen and pelvis shows bladder with wall thickening and irregularity and subtle adjacent inflammation consistent with cystitis. No other acute abnormality within the abdomen or pelvis. Aortic atherosclerosis.   A fib with RVR -- patient has known history of atrial fibrillation on chronic anticoagulation -- heart rate much improved after IV amiodarone drip. Seen by Dr.  Neoma Laming -- Patient's primary cardiologist is Dr. Saralyn Pilar-- patient currently in sinus rhythm. Will switch her back to sotalol and resume eliquis. Discontinue heparin drip. -- Patient will follow-up cardiology as outpatient --10/4--holding eliquis due to plt count <50K --10/9--plt count 60K-- resume elquis   Intractable nausea -- patient receiving schedule Reglan and PRN Zofran. -- Suspected due to chemo effect. -- Encourage to eat small frequent meals --improving slowly --resolved   Diffuse large B-cell lymphoma -- patient follows with Dr. Grayland Ormond -- last cycle chemotherapy was on 29th of September -- both daughter and patient do not want any further cycles of chemo.   Sepsis -- as evident by hypotension that responded to IV fluids, tachycardia tachypnea and leukocytosis-- unclear source -- Pro calcitonin elevated --Patient has some cough. Chest x-ray negative for pneumonia p -- urine culture ecoli -blood-culture negative -- continue to monitor and de-escalate antibiotic to po bactrim per c/s --sepsis resolved   Elevated troponin -- suspected due to demand ischemia from rapid a fib. -- Patient denies any chest pain. -- Has history of CAD--Status post left heart cath in 2019 which showed one-vessel coronary artery disease with occluded proximal RCA with ipsi- and contra- collaterals. Patent stent in first diagonal branch. Normal left ventricular function. --pt remains in NSR   Hypothyroidism -- continue Synthroid   Depression -- continue Zoloft   Thrombocytopenia -- likely due to chemo  --plt 60 K   History of breast cancer Status post radiation therapy and has completed a 5-year course of letrozole.   Diabetes mellitus without complication ([place]) Appears to be diet controlled Cont carbohydrate diet  Family communication none today Consults : cardiology, oncology CODE STATUS: full DVT Prophylaxis :eliquis  D/c to [facility] for rehab     Disposition:  Skilled nursing facility Diet recommendation:  Discharge Diet Orders (From admission, onward)     Start     Ordered   12/29/21 0000  Diet - low sodium heart healthy        12/29/21 0823           Cardiac and Carb modified diet DISCHARGE MEDICATION: Allergies as of 12/29/2021       Reactions   Codeine Other (See Comments)   Stroke like symptoms   Tramadol Itching        Medication List     STOP taking these medications    amLODipine 10 MG tablet Commonly known as: NORVASC   amLODipine 5 MG tablet Commonly known as: NORVASC   Ipratropium-Albuterol 20-100 MCG/ACT Aers respimat Commonly known as: COMBIVENT   magic mouthwash (nystatin, hydrocortisone, diphenhydrAMINE) suspension   potassium chloride 10 MEQ tablet Commonly known as: KLOR-CON   predniSONE 20 MG tablet Commonly known as: DELTASONE   prochlorperazine 10 MG tablet Commonly known as: COMPAZINE   torsemide 20 MG tablet Commonly known as: DEMADEX       TAKE these medications    acetaminophen 500 MG tablet Commonly known as: TYLENOL Take 500 mg by mouth every 4 (four) hours as needed.   apixaban 5 MG Tabs tablet Commonly known as: ELIQUIS Take 1 tablet (5 mg total) by mouth 2 (two) times daily. HOLD till you get the Blood work done on 12/10/21 and d/w Dr Grayland Ormond to see if you can restart it   atorvastatin 40 MG tablet Commonly known as: LIPITOR Take 1 tablet (40 mg total) by mouth at bedtime.   CALCIUM 500 PO Take by mouth.   cyanocobalamin 1000 MCG tablet Commonly known as: VITAMIN B12 Take 1,000 mcg by mouth daily.   feeding supplement (GLUCERNA SHAKE) Liqd Take 237 mLs by mouth 3 (three) times daily between meals.   ferrous sulfate 325 (65 FE) MG tablet Take 325 mg by mouth daily with breakfast.   Fluticasone-Salmeterol 100-50 MCG/DOSE Aepb Commonly known as: ADVAIR Inhale 1 puff into the lungs 2 (two) times daily as needed (for asthma.).   isosorbide mononitrate 30 MG 24 hr  tablet Commonly known as: IMDUR Take 30 mg by mouth daily.   levothyroxine 125 MCG tablet Commonly known as: SYNTHROID Take 125 mcg by mouth daily.   loratadine 10 MG tablet Commonly known as: CLARITIN Take 10 mg by mouth daily.   magnesium oxide 400 (240 Mg) MG tablet Commonly known as: MAG-OX Take 1 tablet (400 mg total) by mouth 2 (two) times daily.   multivitamin with minerals Tabs tablet Take 1 tablet by mouth daily.   nitroGLYCERIN 0.4 MG SL tablet Commonly known as: NITROSTAT Place 0.4 mg under the tongue every 5 (five) minutes as needed for chest pain. Maximum 3 doses, If no relief call md or 911.   ondansetron 8 MG tablet Commonly known as: ZOFRAN Take by mouth every 8 (eight) hours as needed for nausea or vomiting.   RABEprazole 20 MG tablet Commonly known as: ACIPHEX Take 20 mg by mouth daily.   sertraline 50 MG tablet Commonly known as: ZOLOFT Take 50 mg by mouth at bedtime.   sotalol 80 MG tablet Commonly known as: BETAPACE Take 80 mg by mouth 2 (two) times daily.        Follow-up Information  Paraschos, Alexander, MD. Go in 1 week(s).   Specialty: Cardiology Contact information: [facility] [place] [address] [phone]         Rusty Aus, MD. Schedule an appointment as soon as possible for a visit in 1 week(s).   Specialty: Internal Medicine Why: hospital f/u Contact information: [place] [address] [phone]                Discharge Exam: Danley Danker Weights   12/20/21 0655  Weight: 71.2 kg     Condition at discharge: fair  The results of significant diagnostics from this hospitalization (including imaging, microbiology, ancillary and laboratory) are listed below for reference.   Imaging Studies: US Abdomen Limited RUQ (LIVER/GB)  Result Date: 12/20/2021 CLINICAL DATA:  Transaminitis EXAM: ULTRASOUND ABDOMEN  LIMITED RIGHT UPPER QUADRANT COMPARISON:  CT abdomen pelvis, 12/20/2021 FINDINGS: Gallbladder: Status post cholecystectomy. Gallbladder fossa fluid described by prior CT is not appreciated by ultrasound. Common bile duct: Diameter: 0.7 cm Liver: No focal lesion identified. Increased parenchymal echogenicity. Portal vein is patent on color Doppler imaging with normal direction of blood flow towards the liver. Other: None. IMPRESSION: 1. Status post cholecystectomy. Gallbladder fossa fluid described by prior CT is not appreciated by ultrasound. 2. Hepatic steatosis. Electronically Signed   By: Delanna Ahmadi M.D.   On: 12/20/2021 14:01   CT Abdomen Pelvis W Contrast  Result Date: 12/20/2021 CLINICAL DATA:  Sepsis. EXAM: CT ABDOMEN AND PELVIS WITH CONTRAST TECHNIQUE: Multidetector CT imaging of the abdomen and pelvis was performed using the standard protocol following bolus administration of intravenous contrast. RADIATION DOSE REDUCTION: This exam was performed according to the departmental dose-optimization program which includes automated exposure control, adjustment of the mA and/or kV according to patient size and/or use of iterative reconstruction technique. CONTRAST:  132m OMNIPAQUE IOHEXOL 300 MG/ML  SOLN COMPARISON:  01/06/2021. FINDINGS: Lower chest: No acute findings. Hepatobiliary: Normal liver. Small amount of fluid attenuation along the gallbladder fossa. Common bile duct measures 7 mm proximally with normal distal tapering. Pancreas: Unremarkable. No pancreatic ductal dilatation or surrounding inflammatory changes. Spleen: Normal in size without focal abnormality. Adrenals/Urinary Tract: No adrenal masses. Kidneys show symmetric enhancement. 1.4 cm low-attenuation left mid pole cortical mass consistent with a cyst, stable. No follow-up recommended no renal stones. No hydronephrosis. Ureters are normal in course and in caliber. Bladder mildly distended. Wall is mildly thickened and irregular with  subtle adjacent inflammatory haziness. No bladder mass or stone. . Stomach/Bowel: Normal stomach. Small bowel and colon are normal in caliber. No wall thickening. No inflammation. Vascular/Lymphatic: Aortic atherosclerosis. No aneurysm. No enlarged lymph nodes. Reproductive: Prominent postmenopausal uterus. 1.6 cm fibroid. No adnexal masses. Other: No abdominal wall hernia or abnormality. No abdominopelvic ascites. Musculoskeletal: No fracture or acute finding.  No bone lesion. IMPRESSION: 1. Bladder with wall thickening and irregularity and subtle adjacent inflammation consistent with cystitis. 2. No other acute abnormality within the abdomen or pelvis. 3. Aortic atherosclerosis. Electronically Signed   By: DLajean ManesM.D.   On: 12/20/2021 10:05   DG Chest Port 1 View  Result Date: 12/20/2021 CLINICAL DATA:  Chest pain and SOB. Elevated heart rate. B-cell lymphoma. EXAM: PORTABLE CHEST 1 VIEW COMPARISON:  11/25/2021 FINDINGS: There is a right chest wall port a catheter with tip at the cavoatrial junction. Heart size and mediastinal contours are unremarkable. There is no pleural effusion or edema identified. No airspace opacities. IMPRESSION: No active  disease. Electronically Signed   By: Kerby Moors M.D.   On: 12/20/2021 07:43   NM PET Image Restag (PS) Skull Base To Thigh  Result Date: 12/09/2021 CLINICAL DATA:  Subsequent treatment strategy for diffuse large B-cell lymphoma. EXAM: NUCLEAR MEDICINE PET SKULL BASE TO THIGH TECHNIQUE: 8.8 mCi F-18 FDG was injected intravenously. Full-ring PET imaging was performed from the skull base to thigh after the radiotracer. CT data was obtained and used for attenuation correction and anatomic localization. Fasting blood glucose: 110 mg/dl COMPARISON:  PET-CT 08/18/2021 FINDINGS: Mediastinal blood pool activity: SUV max 1.6 Liver activity: SUV max 3.2 NECK: Previously enlarged and hypermetabolic RIGHT cervical and supraclavicular nodes are no longer measurable.  No residual metabolic activity. There is intense residual metabolic activity associated mucosal thickening in the anterior RIGHT maxillary sinus with SUV max equal 16 compared SUV max equal 23. Lesion measures 13 x 6 mm (image 18/2) compared to 17 x 9 mm. Incidental CT findings: None. CHEST: Interval resolution LEFT size and metabolic activity of mediastinal lymph nodes. No residual enlarged or metabolically active mediastinal lymph nodes. Previously seen RIGHT lower lobe peribronchial nodule measures 19 mm (84/series 2) compared to 27 mm. This nodule has low metabolic activity with SUV max equal 1.9 decreased from SUV max equal 15.8. Incidental CT findings: Port in the anterior chest wall with tip in distal SVC. ABDOMEN/PELVIS: Interval resolution of the previously enlarged and hypermetabolic periaortic lymph nodes. No residual adenopathy. For example 5 mm LEFT periaortic node decreased from 18 mm with SUV max equal 0.8 decreased from SUV max equal 14.8. Normal spleen with normal radiotracer activity. Resolution of focal activity. Incidental CT findings: Atherosclerotic calcification of the aorta. Uterus and adnexa unremarkable. SKELETON: Uniform mild increase in marrow activity without focality. Incidental CT findings: None. IMPRESSION: 1. Marked reduction in size and metabolic activity of previous seen cervical, supraclavicular, mediastinal improved retroperitoneal nodes. No residual adenopathy or metabolic activity. [place] 1 2. RIGHT lobe pulmonary nodule with very low metabolic activity and decreased in size. [place] 2 3. Mild uniform increase in marrow activity is consistent with a GCSF type marrow response. 4. Intense uptake within the polypoid lesion in the anterior RIGHT maxillary sinus. Lesion decreased slightly in size and metabolic activity. Favor lesion unrelated to lymphoma as such a dramatic response at other sites of disease as well as atypical location. Probable inflammatory or neoplastic  polypoid lesion. Recommend MRI of the sinuses with contrast for further evaluation. These results will be called to the ordering clinician or representative by the Radiologist Assistant, and communication documented in the PACS or [employer]. Electronically Signed   By: Suzy Bouchard M.D.   On: 12/09/2021 11:53   ECHOCARDIOGRAM COMPLETE  Result Date: 12/01/2021    ECHOCARDIOGRAM REPORT   Patient Name:   [name] Date of Exam: 12/01/2021 Medical Rec #:  [id number]            Height:       62.0 in Accession #:    [id number]           Weight:       161.4 lb Date of Birth:  July 15, 1945           BSA:          1.745 m Patient Age:    76 years             BP:           125/82 mmHg Patient Gender: F  HR:           81 bpm. Exam Location:  [facility] Procedure: 2D Echo, Cardiac Doppler, Color Doppler and Strain Analysis Indications:     Chemo Z09  History:         Patient has prior history of Echocardiogram examinations, most                  recent 10/24/2017. Arrythmias:Atrial Fibrillation; Risk                  Factors:Hypertension.  Sonographer:     Sherrie Sport Referring Phys:  [place] Diagnosing Phys: Isaias Cowman MD  Sonographer Comments: Global longitudinal strain was attempted. IMPRESSIONS  1. Left ventricular ejection fraction, by estimation, is 60 to 65%. The left ventricle has normal function. The left ventricle has no regional wall motion abnormalities. There is mild left ventricular hypertrophy. Left ventricular diastolic parameters were normal.  2. Right ventricular systolic function is normal. The right ventricular size is normal.  3. The mitral valve is normal in structure. Mild mitral valve regurgitation. No evidence of mitral stenosis.  4. The aortic valve is normal in structure. Aortic valve regurgitation is mild. No aortic stenosis is present.  5. The inferior vena cava is normal in size with greater than 50% respiratory variability, suggesting  right atrial pressure of 3 mmHg. FINDINGS  Left Ventricle: Left ventricular ejection fraction, by estimation, is 60 to 65%. The left ventricle has normal function. The left ventricle has no regional wall motion abnormalities. The left ventricular internal cavity size was normal in size. There is  mild left ventricular hypertrophy. Left ventricular diastolic parameters were normal. Right Ventricle: The right ventricular size is normal. No increase in right ventricular wall thickness. Right ventricular systolic function is normal. Left Atrium: Left atrial size was normal in size. Right Atrium: Right atrial size was normal in size. Pericardium: There is no evidence of pericardial effusion. Mitral Valve: The mitral valve is normal in structure. Mild mitral valve regurgitation. No evidence of mitral valve stenosis. Tricuspid Valve: The tricuspid valve is normal in structure. Tricuspid valve regurgitation is mild . No evidence of tricuspid stenosis. Aortic Valve: The aortic valve is normal in structure. Aortic valve regurgitation is mild. No aortic stenosis is present. Aortic valve mean gradient measures 4.0 mmHg. Aortic valve peak gradient measures 7.0 mmHg. Aortic valve area, by VTI measures 2.70 cm. Pulmonic Valve: The pulmonic valve was normal in structure. Pulmonic valve regurgitation is not visualized. No evidence of pulmonic stenosis. Aorta: The aortic root is normal in size and structure. Venous: The inferior vena cava is normal in size with greater than 50% respiratory variability, suggesting right atrial pressure of 3 mmHg. IAS/Shunts: No atrial level shunt detected by color flow Doppler.  LEFT VENTRICLE PLAX 2D LVOT diam:     2.00 cm   Diastology LV SV:         52        LV e' medial:    6.20 cm/s LV SV Index:   30        LV E/e' medial:  8.3 LVOT Area:     3.14 cm  LV e' lateral:   6.64 cm/s                          LV E/e' lateral: 7.7  3D Volume EF:                          3D EF:         53 %                          LV EDV:       73 ml                          LV ESV:       34 ml                          LV SV:        39 ml RIGHT VENTRICLE RV Basal diam:  3.10 cm RV S prime:     15.40 cm/s TAPSE (M-mode): 2.2 cm LEFT ATRIUM           Index        RIGHT ATRIUM           Index LA Vol (A2C): 49.7 ml 28.48 ml/m  RA Area:     13.10 cm LA Vol (A4C): 37.8 ml 21.66 ml/m  RA Volume:   31.90 ml  18.28 ml/m  AORTIC VALVE AV Area (Vmax):    2.24 cm AV Area (Vmean):   2.37 cm AV Area (VTI):     2.70 cm AV Vmax:           132.00 cm/s AV Vmean:          83.500 cm/s AV VTI:            0.191 m AV Peak Grad:      7.0 mmHg AV Mean Grad:      4.0 mmHg LVOT Vmax:         94.00 cm/s LVOT Vmean:        63.000 cm/s LVOT VTI:          0.164 m LVOT/AV VTI ratio: 0.86  AORTA Ao Root diam: 3.33 cm MITRAL VALVE               TRICUSPID VALVE MV Area (PHT): 3.66 cm    TR Peak grad:   21.0 mmHg MV Decel Time: 207 msec    TR Vmax:        229.00 cm/s MV E velocity: 51.40 cm/s MV A velocity: 81.80 cm/s  SHUNTS MV E/A ratio:  0.63        Systemic VTI:  0.16 m                            Systemic Diam: 2.00 cm Isaias Cowman MD Electronically signed by Isaias Cowman MD Signature Date/Time: 12/01/2021/10:58:41 AM    Final     Microbiology: Results for orders placed or performed during the hospital encounter of 12/20/21  Resp Panel by RT-PCR (Flu A&B, Covid) Anterior Nasal Swab     Status: None   Collection Time: 12/20/21  7:27 AM   Specimen: Anterior Nasal Swab  Result Value Ref Range Status   SARS Coronavirus 2 by RT PCR NEGATIVE NEGATIVE Final    Comment: (NOTE) SARS-CoV-2 target nucleic acids are NOT DETECTED.  The SARS-CoV-2 RNA is generally detectable in upper respiratory specimens during the acute phase of infection. The lowest concentration of SARS-CoV-2 viral copies this  assay can detect is 138 copies/mL. A negative result does not preclude SARS-Cov-2 infection and should not be used as  the sole basis for treatment or other patient management decisions. A negative result may occur with  improper specimen collection/handling, submission of specimen other than nasopharyngeal swab, presence of viral mutation(s) within the areas targeted by this assay, and inadequate number of viral copies(<138 copies/mL). A negative result must be combined with clinical observations, patient history, and epidemiological information. The expected result is Negative.  Fact Sheet for Patients:  EntrepreneurPulse.com.au  Fact Sheet for Healthcare Providers:  IncredibleEmployment.be  This test is no t yet approved or cleared by the [place] FDA and  has been authorized for detection and/or diagnosis of SARS-CoV-2 by FDA under an Emergency Use Authorization (EUA). This EUA will remain  in effect (meaning this test can be used) for the duration of the COVID-19 declaration under Section 564(b)(1) of the Act, 21 U.S.C.section 360bbb-3(b)(1), unless the authorization is terminated  or revoked sooner.       Influenza A by PCR NEGATIVE NEGATIVE Final   Influenza B by PCR NEGATIVE NEGATIVE Final    Comment: (NOTE) The Xpert Xpress SARS-CoV-2/FLU/RSV plus assay is intended as an aid in the diagnosis of influenza from Nasopharyngeal swab specimens and should not be used as a sole basis for treatment. Nasal washings and aspirates are unacceptable for Xpert Xpress SARS-CoV-2/FLU/RSV testing.  Fact Sheet for Patients: EntrepreneurPulse.com.au  Fact Sheet for Healthcare Providers: IncredibleEmployment.be  This test is not yet approved or cleared by the [place] FDA and has been authorized for detection and/or diagnosis of SARS-CoV-2 by FDA under an Emergency Use Authorization (EUA). This EUA will remain in effect (meaning this test can be used) for the duration of the COVID-19 declaration under Section 564(b)(1) of  the Act, 21 U.S.C. section 360bbb-3(b)(1), unless the authorization is terminated or revoked.  Performed at [facility], [address] [place], [place] [address]   Culture, blood (routine x 2)     Status: None   Collection Time: 12/20/21  7:27 AM   Specimen: BLOOD  Result Value Ref Range Status   Specimen Description BLOOD BLOOD RIGHT ARM  Final   Special Requests   Final    BOTTLES DRAWN AEROBIC AND ANAEROBIC Blood Culture adequate volume   Culture   Final    NO GROWTH 5 DAYS Performed at [facility], [address], [place], [place] [address]    Report Status 12/25/2021 FINAL  Final  Culture, blood (routine x 2)     Status: None   Collection Time: 12/20/21  7:27 AM   Specimen: BLOOD  Result Value Ref Range Status   Specimen Description BLOOD BLOOD RIGHT HAND  Final   Special Requests   Final    BOTTLES DRAWN AEROBIC AND ANAEROBIC Blood Culture adequate volume   Culture   Final    NO GROWTH 5 DAYS Performed at [facility], [address] [place], [place] [address]    Report Status 12/25/2021 FINAL  Final  Urine Culture     Status: Abnormal   Collection Time: 12/20/21  7:27 AM   Specimen: Urine, Clean Catch  Result Value Ref Range Status   Specimen Description   Final    URINE, CLEAN CATCH Performed at [facility], [address], [place], [place] [address]    Special Requests   Final    NONE Performed at [facility], [place], [place]  [address]    Culture (A)  Final    60,000 COLONIES/mL ESCHERICHIA COLI Confirmed Extended Spectrum Beta-Lactamase Producer (ESBL).  In bloodstream infections from ESBL organisms, carbapenems are preferred over piperacillin/tazobactam. They are shown to have a lower risk of mortality.    Report Status 12/23/2021 FINAL  Final   Organism ID, Bacteria ESCHERICHIA COLI (A)  Final      Susceptibility   Escherichia coli - MIC*    AMPICILLIN >=32 RESISTANT Resistant      CEFAZOLIN >=64 RESISTANT Resistant     CEFEPIME 16 RESISTANT Resistant     CEFTRIAXONE >=64 RESISTANT Resistant     CIPROFLOXACIN >=4 RESISTANT Resistant     GENTAMICIN <=1 SENSITIVE Sensitive     IMIPENEM <=0.25 SENSITIVE Sensitive     NITROFURANTOIN 64 INTERMEDIATE Intermediate     TRIMETH/SULFA <=20 SENSITIVE Sensitive     AMPICILLIN/SULBACTAM >=32 RESISTANT Resistant     PIP/TAZO 8 SENSITIVE Sensitive     * 60,000 COLONIES/mL ESCHERICHIA COLI  Culture, blood (Routine X 2) w Reflex to ID Panel     Status: None   Collection Time: 12/23/21 12:27 AM   Specimen: BLOOD  Result Value Ref Range Status   Specimen Description BLOOD RIGHT ANTECUBITAL  Final   Special Requests   Final    BOTTLES DRAWN AEROBIC AND ANAEROBIC Blood Culture adequate volume   Culture   Final    NO GROWTH 5 DAYS Performed at [facility], [address] [place], [place] [address]    Report Status 12/28/2021 FINAL  Final  Culture, blood (Routine X 2) w Reflex to ID Panel     Status: None   Collection Time: 12/23/21 12:28 AM   Specimen: BLOOD RIGHT HAND  Result Value Ref Range Status   Specimen Description BLOOD RIGHT HAND  Final   Special Requests   Final    BOTTLES DRAWN AEROBIC AND ANAEROBIC Blood Culture adequate volume   Culture   Final    NO GROWTH 5 DAYS Performed at [facility], [address] [place], [place] [address]    Report Status 12/28/2021 FINAL  Final    Labs: CBC: Recent Labs  Lab 12/23/21 0027 12/24/21 0627 12/28/21 1020  WBC 2.6* 0.3*  --   HGB 7.3* 7.8*  --   HCT 23.0* 23.0*  --   MCV 97.9 91.3  --   PLT 27* 30* 65*   Basic Metabolic Panel: Recent Labs  Lab 12/23/21 0027 12/23/21 0751 12/24/21 0627  NA 133*  --  130*  K 3.7  --  3.4*  CL 103  --  96*  CO2 27  --  26  GLUCOSE 92  --  91  BUN 10  --  9  CREATININE 0.57  --  0.52  CALCIUM 8.2*  --  8.1*  MG  --  1.9 1.8   CBG: Recent Labs  Lab 12/26/21 1151 12/26/21 1641 12/27/21 0422  12/28/21 0451 12/29/21 0601  GLUCAP 113* 100* 98 99 95    Discharge time spent: greater than 30 minutes.  Signed: Fritzi Mandes, MD [facility] 12/29/2021

## 2021-12-30 ENCOUNTER — Other Ambulatory Visit: Payer: No Typology Code available for payment source

## 2021-12-30 ENCOUNTER — Inpatient Hospital Stay: Payer: No Typology Code available for payment source | Admitting: Oncology

## 2021-12-30 ENCOUNTER — Ambulatory Visit: Payer: BC Managed Care – PPO | Admitting: Oncology

## 2021-12-31 ENCOUNTER — Ambulatory Visit: Payer: 59 | Admitting: Oncology

## 2021-12-31 ENCOUNTER — Ambulatory Visit: Payer: 59

## 2021-12-31 ENCOUNTER — Other Ambulatory Visit: Payer: 59

## 2021-12-31 NOTE — Progress Notes (Signed)
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  INTERVAL HISTORY: Patient returns to clinic today for further evaluation and hospital follow-up.  She currently feels well and nearly back to her baseline.  She continues to have mild weakness and fatigue.  She does not complain of dizziness today.  She has no further fevers.  She denies any night sweats or weight loss.  She has no neurologic complaints.  She denies any chest pain, cough, shortness of breath, or hemoptysis.  She denies any nausea, vomiting, constipation, or diarrhea. She has no urinary complaints.  Patient offers no further specific complaints today.  REVIEW OF SYSTEMS:   Review of Systems  Constitutional:  Positive for malaise/fatigue. Negative for chills, diaphoresis, fever and weight loss.  Respiratory: Negative.  Negative for cough and shortness of breath.   Cardiovascular: Negative.  Negative for chest pain and leg swelling.  Gastrointestinal: Negative.  Negative for abdominal pain.  Genitourinary: Negative.  Negative for dysuria.  Musculoskeletal: Negative.  Negative for back pain.  Skin: Negative.  Negative for rash.  Neurological:  Positive for weakness. Negative for dizziness, sensory change, focal weakness and headaches.  Psychiatric/Behavioral: Negative.  The patient is not nervous/anxious.     As per HPI. Otherwise, a complete review of systems is negative.  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 MammaPrint was reported as low risk, therefore she did not require adjuvant chemotherapy. She completed adjuvant XRT. Will complete 5 year of letrozole in July 2023.   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  Patient's most recent CT scan on April 28, 2018 revealed no evidence of progressive or recurrent disease.    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/05/2014 Initial Diagnosis   MALT (mucosa associated lymphoid tissue)   Primary cancer of upper outer quadrant of left female breast ([place])  05/09/2016 Initial Diagnosis   Primary cancer of upper outer quadrant of left female breast ([place])   DLBCL (diffuse large B cell lymphoma) ([place])  09/10/2021 Initial Diagnosis   DLBCL (diffuse large B cell lymphoma) ([place])   09/17/2021 - 10/30/2021 Chemotherapy   Patient is on Treatment Plan : NON-HODGKINS LYMPHOMA R-CHOP q21d     09/17/2021 - 12/18/2021 Chemotherapy   Patient is on Treatment Plan : NON-HODGKINS LYMPHOMA R-CHOP q21d     09/29/2021 Cancer Staging   Staging form: Hodgkin and Non-Hodgkin Lymphoma, AJCC 8th Edition - Clinical stage from 09/29/2021: Stage III (Diffuse large B-cell lymphoma) - Signed by Lloyd Huger, MD on 09/29/2021 Stage prefix: Initial diagnosis     PAST MEDICAL HISTORY: Past Medical History:  Diagnosis Date   Abdominal pain, left lower quadrant    epiploic appendagitis   Anginal pain ([place])    Arthritis 06/06/2018   knee   Asthma    mild   Atrial fibrillation (HCC)    Breast cancer (HCC)    Coronary artery disease 06/06/2018   1 artery 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 Mamie Nick. at  [facility] in [place]   Depression    Depression 12/20/2021   Diabetes mellitus without complication ([place])    Dyspnea    Dysrhythmia    GERD (gastroesophageal reflux disease)    Hypertension    Hypothyroidism    MALT (mucosa associated lymphoid tissue) ([place]) 07/05/2014    Right neck mass resected 01/2014.   No pertinent past medical history    Pancytopenia ([place])    Personal history of radiation therapy    Sleep apnea 06/06/2018   O2- 2l at bedtime and BiPap @ bedtime    PAST SURGICAL HISTORY: Past Surgical History:  Procedure Laterality Date   BREAST BIOPSY Left 2/136/2018   INVASIVE MAMMARY CARCINOMA   BREAST BIOPSY Right 05/25/2016   FIBROCYSTIC CHANGE WITH CALCIFICATIONS    BREAST BIOPSY Right 05/29/2020   Affirm bx-"X" clip-FIBROADENOMA WITH ASSOCIATED CALCIFICATIONS. - NEGATIVE   BREAST EXCISIONAL BIOPSY Left 06/04/2016   INVASIVE MAMMARY CARCINOMA.    BREAST LUMPECTOMY Left 06/04/2016   INVASIVE MAMMARY CARCINOMA.    CARDIAC CATHETERIZATION     CARDIAC CATHETERIZATION N/A 09/24/2015   Procedure: Left Heart Cath and Coronary Angiography;  Surgeon: Isaias Cowman, MD;  Location: [facility] [facility];  Service: Cardiovascular;  Laterality: N/A;   CARDIAC CATHETERIZATION N/A 09/24/2015   Procedure: Coronary Stent Intervention;  Surgeon: Isaias Cowman, MD;  Location: [place] [facility];  Service: Cardiovascular;  Laterality: N/A;   CHOLECYSTECTOMY  11/26   08/1970   COLONOSCOPY WITH PROPOFOL N/A 07/08/2021   Procedure: COLONOSCOPY WITH PROPOFOL;  Surgeon: Toledo, Benay Pike, MD;  Location: [facility];  Service: Gastroenterology;  Laterality: N/A;   DILATATION & CURETTAGE/HYSTEROSCOPY WITH MYOSURE N/A 05/05/2015   Procedure: Hysteroscopy and endometrial curretage;  Surgeon: Boykin Nearing, MD;  Location: [facility];  Service: Gynecology;  Laterality: N/A;   DILATION AND CURETTAGE OF UTERUS     EXCISION MASS FROM NECK  Right    Dr. Tami Ribas   EYE SURGERY  05/29/2020   bil cataract 9/08   HAMMER TOE SURGERY Right    IR BONE MARROW BIOPSY & ASPIRATION  09/09/2021   IR IMAGING GUIDED PORT INSERTION  09/09/2021   JOINT REPLACEMENT Bilateral 2008, 11/26/ 2012   Partial Knee Replacements   LEFT HEART CATH AND CORONARY ANGIOGRAPHY N/A  10/25/2017   Procedure: LEFT HEART CATH AND CORONARY ANGIOGRAPHY;  Surgeon: Teodoro Spray, MD;  Location: [place] [facility];  Service: Cardiovascular;  Laterality: N/A;   LEFT HEART CATH AND CORONARY ANGIOGRAPHY N/A 10/26/2017   Procedure: LEFT HEART CATH AND CORONARY ANGIOGRAPHY;  Surgeon: Isaias Cowman, MD;  Location: [place] [facility];  Service: Cardiovascular;  Laterality: N/A;   PARTIAL MASTECTOMY WITH NEEDLE LOCALIZATION Left 06/04/2016   Procedure: PARTIAL MASTECTOMY WITH NEEDLE LOCALIZATION;  Surgeon: Leonie Green, MD;  Location: [facility];  Service: General;  Laterality: Left;   SCLERAL BUCKLE  02/16/2011   Procedure: SCLERAL BUCKLE;  Surgeon: Hayden Pedro, MD;  Location: [place];  Service: Ophthalmology;  Laterality: Left;  Scleral Buckle Left Eye with Headscope Laser   SENTINEL NODE BIOPSY Left 06/04/2016   Procedure: SENTINEL NODE BIOPSY;  Surgeon: Leonie Green, MD;  Location: [facility];  Service: General;  Laterality: Left;    FAMILY HISTORY Family History  Problem Relation Age of Onset   Breast cancer Mother 21   Heart disease Father    Breast cancer Paternal Aunt 40   Breast cancer Cousin    Anesthesia problems Neg Hx  Hypotension Neg Hx    Malignant hyperthermia Neg Hx    Pseudochol deficiency Neg Hx     GYNECOLOGIC HISTORY:  No LMP recorded. Patient is postmenopausal.     ADVANCED DIRECTIVES:    HEALTH MAINTENANCE: Social History   Tobacco Use   Smoking status: Never   Smokeless tobacco: Never  Vaping Use   Vaping Use: Never used  Substance Use Topics   Alcohol use: No   Drug use: No     Colonoscopy:  PAP:  Bone density:  Mammogram: November 2016  Allergies  Allergen Reactions   Codeine Other (See Comments)    Stroke like symptoms   Tramadol Itching    Current Outpatient Medications  Medication Sig Dispense Refill   acetaminophen (TYLENOL) 500 MG tablet Take 500 mg by mouth every 4 (four) hours as needed.      apixaban (ELIQUIS) 5 MG TABS tablet Take 1 tablet (5 mg total) by mouth 2 (two) times daily. HOLD till you get the Blood work done on 12/10/21 and d/w Dr Grayland Ormond to see if you can restart it 60 tablet 1   atorvastatin (LIPITOR) 40 MG tablet Take 1 tablet (40 mg total) by mouth at bedtime.     Calcium Carbonate (CALCIUM 500 PO) Take by mouth.     cyanocobalamin (VITAMIN B12) 1000 MCG tablet Take 1,000 mcg by mouth daily.     feeding supplement, GLUCERNA SHAKE, (GLUCERNA SHAKE) LIQD Take 237 mLs by mouth 3 (three) times daily between meals. 237 mL 0   ferrous sulfate 325 (65 FE) MG tablet Take 325 mg by mouth daily with breakfast.     Fluticasone-Salmeterol (ADVAIR) 100-50 MCG/DOSE AEPB Inhale 1 puff into the lungs 2 (two) times daily as needed (for asthma.).     isosorbide mononitrate (IMDUR) 30 MG 24 hr tablet Take 30 mg by mouth daily.     levothyroxine (SYNTHROID) 125 MCG tablet Take 125 mcg by mouth daily.     loratadine (CLARITIN) 10 MG tablet Take 10 mg by mouth daily.     magnesium oxide (MAG-OX) 400 (240 Mg) MG tablet Take 1 tablet (400 mg total) by mouth 2 (two) times daily. 60 tablet 0   Multiple Vitamin (MULTIVITAMIN WITH MINERALS) TABS tablet Take 1 tablet by mouth daily. 30 tablet 0   nitroGLYCERIN (NITROSTAT) 0.4 MG SL tablet Place 0.4 mg under the tongue every 5 (five) minutes as needed for chest pain. Maximum 3 doses, If no relief call md or 911.     ondansetron (ZOFRAN) 8 MG tablet Take by mouth every 8 (eight) hours as needed for nausea or vomiting.     RABEprazole (ACIPHEX) 20 MG tablet Take 20 mg by mouth daily.     sertraline (ZOLOFT) 50 MG tablet Take 50 mg by mouth at bedtime.     sotalol (BETAPACE) 80 MG tablet Take 80 mg by mouth 2 (two) times daily.     No current facility-administered medications for this visit.   Facility-Administered Medications Ordered in Other Visits  Medication Dose Route Frequency Provider Last Rate Last Admin   acetaminophen (TYLENOL) 325 MG  tablet            diphenhydrAMINE (BENADRYL) 25 mg capsule            heparin lock flush 100 UNIT/ML injection            heparin lock flush 100 UNIT/ML injection            palonosetron (ALOXI) 0.25 MG/5ML  injection             OBJECTIVE: BP (!) 132/93 (BP Location: Left Arm, Patient Position: Sitting)   Pulse 79   Temp (!) 97.3 F (36.3 C) (Tympanic)   Wt 156 lb (70.8 kg)   BMI 28.53 kg/m    Body mass index is 28.53 kg/m.    ECOG FS:1 - Symptomatic but completely ambulatory  General: Well-developed, well-nourished, no acute distress. Eyes: Pink conjunctiva, anicteric sclera. HEENT: Normocephalic, moist mucous membranes. Lungs: No audible wheezing or coughing. Heart: Regular rate and rhythm. Abdomen: Soft, nontender, no obvious distention. Musculoskeletal: No edema, cyanosis, or clubbing. Neuro: Alert, answering all questions appropriately. Cranial nerves grossly intact. Skin: No rashes or petechiae noted. Psych: Normal affect. Lymphatics: No palpable lymphadenopathy.  LAB RESULTS:  CBC    Component Value Date/Time   WBC 4.0 01/07/2022 0855   RBC 2.82 (L) 01/07/2022 0855   HGB 9.5 (L) 01/07/2022 0855   HGB 13.3 07/03/2014 1503   HCT 29.6 (L) 01/07/2022 0855   HCT 40.8 07/03/2014 1503   PLT 109 (L) 01/07/2022 0855   PLT 240 07/03/2014 1503   MCV 105.0 (H) 01/07/2022 0855   MCV 80 07/03/2014 1503   MCH 33.7 01/07/2022 0855   MCHC 32.1 01/07/2022 0855   RDW 22.0 (H) 01/07/2022 0855   RDW 15.3 (H) 07/03/2014 1503   LYMPHSABS 1.3 01/07/2022 0855   LYMPHSABS 2.8 07/03/2014 1503   MONOABS 0.4 01/07/2022 0855   MONOABS 0.5 07/03/2014 1503   EOSABS 0.1 01/07/2022 0855   EOSABS 0.3 07/03/2014 1503   BASOSABS 0.0 01/07/2022 0855   BASOSABS 0.1 07/03/2014 1503     STUDIES: No results found.  ASSESSMENT: Stage III diffuse large B-cell lymphoma.  PLAN:    Stage III diffuse large B-cell lymphoma: PET scan and biopsy results reviewed independently.  Case discussed  with pathology confirming this is not a recurrence of patient's MALT lymphoma.  Bone marrow biopsy did not reveal any evidence of lymphoma.  Repeat PET scan results from December 09, 2021 after 4 cycles of R-CHOP reviewed independently with complete metabolic response of patient's lymphoma.  Agree that sinus lesion is unrelated and a referral has been sent back to ENT.  Patient then received 1 dose of R-CVP on December 17, 2021, but given significant toxicity chemotherapy was subsequently discontinued.  No further treatments are planned at this time.  Return to clinic in 3 months with repeat imaging with CT scan, laboratory work, and further evaluation.   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 MammaPrint was reported as low risk, therefore she did not require adjuvant chemotherapy. She completed adjuvant XRT.  Patient completed 5 years of letrozole in June 2023.  Her most recent mammogram on May 20, 2021 was reported as BI-RADS 1.  Repeat in March 2024.   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  Case discussed with pathology.  This is a different primary.   Left renal lesion: Repeat CT scan on December 20, 2021 revealed persistent and stable left renal lesion consistent with a benign cyst.   Bone mineral density: Patient's most recent bone mineral density on November 13, 2020 revealed a T score of -2.0 which is slightly worse than 1 year prior where her T score was reported -1.7.  Continue calcium and vitamin D supplementation.  Repeat bone mineral density at the completion of chemotherapy.   Anemia: Chronic and unchanged.  Patient's hemoglobin is 9.5. Neutropenia: Resolved. Thrombocytopenia: Resolved. COVID: Symptoms have resolved, although patient remains COVID-positive  on lab work. Recurrent UTI: Resolved. Hypotension: Resolved.  Patient has discontinued amlodipine.  Patient expressed understanding and was in agreement with this plan. She also understands that She can call clinic at any time with any questions, concerns, or complaints.     Lloyd Huger, MD   01/08/2022 6:37 AM

## 2021-12-31 NOTE — Telephone Encounter (Signed)
Followed up with patient on referral. She has an appointment scheduled for 01/13/22 with [facility]

## 2022-01-01 ENCOUNTER — Ambulatory Visit: Payer: 59

## 2022-01-05 ENCOUNTER — Other Ambulatory Visit: Payer: Self-pay

## 2022-01-05 ENCOUNTER — Encounter: Payer: Self-pay | Admitting: Oncology

## 2022-01-05 DIAGNOSIS — D696 Thrombocytopenia, unspecified: Secondary | ICD-10-CM

## 2022-01-07 ENCOUNTER — Inpatient Hospital Stay: Payer: No Typology Code available for payment source

## 2022-01-07 ENCOUNTER — Inpatient Hospital Stay: Payer: No Typology Code available for payment source | Attending: Nurse Practitioner

## 2022-01-07 ENCOUNTER — Encounter: Payer: Self-pay | Admitting: Oncology

## 2022-01-07 ENCOUNTER — Ambulatory Visit: Payer: 59

## 2022-01-07 ENCOUNTER — Inpatient Hospital Stay (HOSPITAL_BASED_OUTPATIENT_CLINIC_OR_DEPARTMENT_OTHER): Payer: No Typology Code available for payment source | Admitting: Oncology

## 2022-01-07 VITALS — BP 132/93 | HR 79 | Temp 97.3°F | Wt 156.0 lb

## 2022-01-07 DIAGNOSIS — C8330 Diffuse large B-cell lymphoma, unspecified site: Secondary | ICD-10-CM | POA: Diagnosis not present

## 2022-01-07 DIAGNOSIS — Z8572 Personal history of non-Hodgkin lymphomas: Secondary | ICD-10-CM | POA: Insufficient documentation

## 2022-01-07 DIAGNOSIS — D649 Anemia, unspecified: Secondary | ICD-10-CM | POA: Diagnosis not present

## 2022-01-07 DIAGNOSIS — Z923 Personal history of irradiation: Secondary | ICD-10-CM | POA: Insufficient documentation

## 2022-01-07 DIAGNOSIS — Z803 Family history of malignant neoplasm of breast: Secondary | ICD-10-CM | POA: Insufficient documentation

## 2022-01-07 DIAGNOSIS — J3489 Other specified disorders of nose and nasal sinuses: Secondary | ICD-10-CM | POA: Diagnosis not present

## 2022-01-07 DIAGNOSIS — Z853 Personal history of malignant neoplasm of breast: Secondary | ICD-10-CM | POA: Insufficient documentation

## 2022-01-07 DIAGNOSIS — D696 Thrombocytopenia, unspecified: Secondary | ICD-10-CM

## 2022-01-07 DIAGNOSIS — Z8616 Personal history of COVID-19: Secondary | ICD-10-CM | POA: Diagnosis not present

## 2022-01-07 LAB — COMPREHENSIVE METABOLIC PANEL
ALT: 16 U/L (ref 0–44)
AST: 34 U/L (ref 15–41)
Albumin: 3.2 g/dL — ABNORMAL LOW (ref 3.5–5.0)
Alkaline Phosphatase: 99 U/L (ref 38–126)
Anion gap: 10 (ref 5–15)
BUN: 8 mg/dL (ref 8–23)
CO2: 24 mmol/L (ref 22–32)
Calcium: 9.4 mg/dL (ref 8.9–10.3)
Chloride: 104 mmol/L (ref 98–111)
Creatinine, Ser: 0.71 mg/dL (ref 0.44–1.00)
GFR, Estimated: >60 mL/min (ref >60)
Glucose, Bld: 128 mg/dL — ABNORMAL HIGH (ref 70–99)
Potassium: 3.8 mmol/L (ref 3.5–5.1)
Sodium: 138 mmol/L (ref 135–145)
Total Bilirubin: 0.6 mg/dL (ref 0.3–1.2)
Total Protein: 6.1 g/dL — ABNORMAL LOW (ref 6.5–8.1)

## 2022-01-07 LAB — CBC WITH DIFFERENTIAL/PLATELET
Abs Immature Granulocytes: 0.06 10*3/uL (ref 0.00–0.07)
Basophils Absolute: 0 10*3/uL (ref 0.0–0.1)
Basophils Relative: 1 %
Eosinophils Absolute: 0.1 10*3/uL (ref 0.0–0.5)
Eosinophils Relative: 1 %
HCT: 29.6 % — ABNORMAL LOW (ref 36.0–46.0)
Hemoglobin: 9.5 g/dL — ABNORMAL LOW (ref 12.0–15.0)
Immature Granulocytes: 2 %
Lymphocytes Relative: 33 %
Lymphs Abs: 1.3 10*3/uL (ref 0.7–4.0)
MCH: 33.7 pg (ref 26.0–34.0)
MCHC: 32.1 g/dL (ref 30.0–36.0)
MCV: 105 fL — ABNORMAL HIGH (ref 80.0–100.0)
Monocytes Absolute: 0.4 10*3/uL (ref 0.1–1.0)
Monocytes Relative: 11 %
Neutro Abs: 2.1 10*3/uL (ref 1.7–7.7)
Neutrophils Relative %: 52 %
Platelets: 109 10*3/uL — ABNORMAL LOW (ref 150–400)
RBC: 2.82 MIL/uL — ABNORMAL LOW (ref 3.87–5.11)
RDW: 22 % — ABNORMAL HIGH (ref 11.5–15.5)
WBC: 4 10*3/uL (ref 4.0–10.5)
nRBC: 0 % (ref 0.0–0.2)

## 2022-01-07 LAB — MAGNESIUM: Magnesium: 1.9 mg/dL (ref 1.7–2.4)

## 2022-01-07 MED ORDER — SODIUM CHLORIDE 0.9% FLUSH
10.0000 mL | INTRAVENOUS | Status: DC | PRN
  Administered 2022-01-07: 10 mL via INTRAVENOUS
  Filled 2022-01-07: qty 10

## 2022-01-07 MED ORDER — HEPARIN SOD (PORK) LOCK FLUSH 100 UNIT/ML IV SOLN
INTRAVENOUS | Status: AC
  Administered 2022-01-07: 500 [IU] via INTRAVENOUS
  Filled 2022-01-07: qty 5

## 2022-01-07 MED ORDER — HEPARIN SOD (PORK) LOCK FLUSH 100 UNIT/ML IV SOLN
500.0000 [IU] | Freq: Once | INTRAVENOUS | Status: AC
  Filled 2022-01-07: qty 5

## 2022-01-07 NOTE — Progress Notes (Signed)
Nutrition Follow-up:  Patient with large B-Cell lymphoma.  Patient completed 5 treatments of R-CHOP.  Noted recent hospital admission.    Spoke with patient via phone.  Patient reports that she stayed in rehab 2 1/2 days and then came home.  PT/OT is coming to her house for therapy.  Brother and sister-in-law are providing assistance with meals.  She has decided to not take 6th treatment.  Reports that appetite is picking back up and taste is coming back. Was able to enjoy and taste chicken alfredo today for lunch.      Medications: reviewed  Labs: reviewed  Anthropometrics:   Weight 156 lb 10/19 161 lb on 8/10 166 lb on 7/20 176 lb on 6/29 185 lb on 4/17   NUTRITION DIAGNOSIS: Inadequate oral intake improving    INTERVENTION:  Encouraged patient to continue to focus on high calorie, high protein foods. Has snacks on hand (pimento cheese, chicken salad, peanut butter and jelly) Continue with PT/OT to improve strength. Patient has contact information and will contact RD if needed in the future       NEXT VISIT: no follow-up planned RD available as needed  Geana Walts B. Zenia Resides, [place], [place] Registered Dietitian [phone]

## 2022-01-08 ENCOUNTER — Telehealth: Payer: Self-pay | Admitting: Oncology

## 2022-01-08 ENCOUNTER — Encounter: Payer: Self-pay | Admitting: Oncology

## 2022-01-08 ENCOUNTER — Ambulatory Visit: Payer: 59

## 2022-01-08 NOTE — Telephone Encounter (Signed)
Per MD--patient to have a CT scan with MD f/u in 3 months. Changes made to all appointments and spoke with patient to review and confirm. She requested an update AVS which I will send today. Also reminded her that she does not need to pick up her prep, rather she needs to arrive 2 hours prior to her scan to drink the contrast on site.

## 2022-01-14 ENCOUNTER — Other Ambulatory Visit: Payer: Self-pay | Admitting: Unknown Physician Specialty

## 2022-01-14 DIAGNOSIS — D385 Neoplasm of uncertain behavior of other respiratory organs: Secondary | ICD-10-CM

## 2022-01-27 ENCOUNTER — Encounter: Payer: Self-pay | Admitting: Oncology

## 2022-01-27 ENCOUNTER — Other Ambulatory Visit: Payer: Self-pay | Admitting: *Deleted

## 2022-01-27 DIAGNOSIS — C8330 Diffuse large B-cell lymphoma, unspecified site: Secondary | ICD-10-CM

## 2022-01-27 NOTE — Telephone Encounter (Signed)
Referrals entered into epic for CARE program and SW as per patient request.

## 2022-01-28 ENCOUNTER — Inpatient Hospital Stay
Payer: No Typology Code available for payment source | Attending: Nurse Practitioner | Admitting: Licensed Clinical Social Worker

## 2022-01-28 ENCOUNTER — Ambulatory Visit: Payer: No Typology Code available for payment source

## 2022-01-28 DIAGNOSIS — Z452 Encounter for adjustment and management of vascular access device: Secondary | ICD-10-CM | POA: Insufficient documentation

## 2022-01-28 DIAGNOSIS — Z853 Personal history of malignant neoplasm of breast: Secondary | ICD-10-CM | POA: Insufficient documentation

## 2022-01-28 DIAGNOSIS — C8330 Diffuse large B-cell lymphoma, unspecified site: Secondary | ICD-10-CM

## 2022-01-28 NOTE — Progress Notes (Signed)
[place] Work  Initial Assessment   Daysy A Montero is a 76 y.o. year old female contacted by phone. Clinical Social Work was referred by medical provider for assessment of psychosocial needs.   SDOH (Social Determinants of Health) assessments performed: Yes SDOH Interventions    Flowsheet Row Clinical Support from 01/28/2022 in [place] [place] Interventions   Food Insecurity Interventions Intervention Not Indicated  Housing Interventions Intervention Not Indicated  Transportation Interventions Patient Resources (Friends/Family), CCAR Printmaker ([facility] Only)  [request sent to [place] for transportation]  Utilities Interventions Intervention Not Indicated  Alcohol Usage Interventions Intervention Not Indicated (Score <7)  Financial Strain Interventions Intervention Not Indicated  Physical Activity Interventions Intervention Not Indicated, Other (Comments)  [CARE program referral made by RN]  Stress Interventions Intervention Not Indicated  Social Connections Interventions Intervention Not Indicated       SDOH Screenings   Food Insecurity: No Food Insecurity (01/28/2022)  Housing: Low Risk  (01/28/2022)  Transportation Needs: No Transportation Needs (01/28/2022)  Utilities: Not At Risk (01/28/2022)  Alcohol Screen: Low Risk  (01/28/2022)  Depression (PHQ2-9): Low Risk  (01/28/2022)  Financial Resource Strain: Low Risk  (01/28/2022)  Physical Activity: Inactive (01/28/2022)  Social Connections: Moderately Isolated (01/28/2022)  Stress: Stress Concern Present (01/28/2022)  Tobacco Use: Low Risk  (01/07/2022)     Distress Screen completed: No    07/05/2016    1:34 PM  ONCBCN DISTRESS SCREENING  Screening Type Initial Screening  Distress experienced in past week (1-10) 0      Family/Social Information:  Housing Arrangement: patient lives with adult dependent son.   Ancil Boozer [phone] daughter is the main  contact/caregiver Family members/support persons in your life? Family, Friends, Medical laboratory scientific officer, and Geophysical data processor concerns: yes, patient is unable to drive at the moment due to leg weakness, she is currently receiving transportation from friends and family, but is interested in [facility] transportation assistance. Employment: Working full time Programme researcher, broadcasting/film/video on work excuse , patient plans to return to work on December 1st patient works at [employer].  Income source: Employment and Paediatric nurse concerns: No immediate financial needs identified. Type of concern: Transportation Food access concerns: no Religious or spiritual practice: Yes-Christian Services Currently in place:  [employer] PPO, referral to CARE program  Coping/ Adjustment to diagnosis: Patient understands treatment plan and what happens next? yes Concerns about diagnosis and/or treatment: Losing my job and/or losing income and transportation Patient reported stressors: Transportation, anxiety and adjustment on new ADL baseline after treatment Hopes and/or priorities: to returnt o work on Dec. 1st, 2023 Patient enjoys time with family/ friends and working Current coping skills/ strengths: Ability for insight , Active sense of humor , Average or above average intelligence , Capable of independent living , Armed forces logistics/support/administrative officer , Scientist, research (life sciences) , General fund of knowledge , Motivation for treatment/growth , Supportive family/friends , and Work skills     SUMMARY: Current SDOH Barriers:  Transportation - referral to [facility] transportation services referral  Clinical Social Work Clinical Goal(s):  Patient will work with SW to address concerns related to adjustment to new ADL baseline  Interventions: Discussed common feeling and emotions when being diagnosed with cancer, and the importance of support during treatment Informed patient of the support team roles and support services at [facility] Provided [place] contact  information and encouraged patient to call with any questions or concerns Referred patient to Transportation services and Provided patient with information about CSW  role in patient care and other available resources   Follow Up Plan: CSW will see patient on 02/05/2022 at 9:30AM Patient verbalizes understanding of plan: Yes    Adelene Amas, LCSW

## 2022-02-02 ENCOUNTER — Other Ambulatory Visit: Payer: No Typology Code available for payment source

## 2022-02-02 ENCOUNTER — Ambulatory Visit
Admission: RE | Admit: 2022-02-02 | Discharge: 2022-02-02 | Disposition: A | Discharge status: Home / Self Care | Payer: No Typology Code available for payment source | Source: Ambulatory Visit | Attending: Unknown Physician Specialty | Admitting: Unknown Physician Specialty

## 2022-02-02 ENCOUNTER — Ambulatory Visit: Payer: No Typology Code available for payment source | Admitting: Oncology

## 2022-02-02 DIAGNOSIS — D385 Neoplasm of uncertain behavior of other respiratory organs: Secondary | ICD-10-CM

## 2022-02-02 MED ORDER — GADOBENATE DIMEGLUMINE 529 MG/ML IV SOLN
15.0000 mL | Freq: Once | INTRAVENOUS | Status: AC | PRN
  Administered 2022-02-02: 15 mL via INTRAVENOUS

## 2022-02-05 ENCOUNTER — Inpatient Hospital Stay: Payer: No Typology Code available for payment source | Admitting: Licensed Clinical Social Worker

## 2022-02-09 ENCOUNTER — Other Ambulatory Visit: Payer: Self-pay | Admitting: Unknown Physician Specialty

## 2022-02-09 ENCOUNTER — Other Ambulatory Visit: Payer: No Typology Code available for payment source

## 2022-02-09 DIAGNOSIS — J329 Chronic sinusitis, unspecified: Secondary | ICD-10-CM

## 2022-02-09 DIAGNOSIS — J341 Cyst and mucocele of nose and nasal sinus: Secondary | ICD-10-CM

## 2022-02-15 ENCOUNTER — Encounter: Payer: No Typology Code available for payment source | Attending: Oncology

## 2022-02-15 DIAGNOSIS — C8330 Diffuse large B-cell lymphoma, unspecified site: Secondary | ICD-10-CM

## 2022-02-15 NOTE — Progress Notes (Signed)
Completed phone call interview with patient for the CARE program. Medications and medical history reviewed. All questions answered. Patient due for orientation and physical assessment tomorrow, 11/28.

## 2022-02-16 VITALS — Ht 63.2 in | Wt 148.6 lb

## 2022-02-16 DIAGNOSIS — C8330 Diffuse large B-cell lymphoma, unspecified site: Secondary | ICD-10-CM

## 2022-02-16 NOTE — Progress Notes (Signed)
Care Orientation Note  Patient Details  Name: Stacey Stone MRN: [id number] Date of Birth: 05/30/1945 Referring Provider:   Flowsheet Row Cancer Associated Rehabilitation & Exercise from 02/16/2022 in [facility]  Referring Provider Finnegan, Timothy MD       Encounter Date: 02/16/2022  Check In:  Session Check In - 02/16/22 1421       Check-In   Supervising physician immediately available to respond to emergencies See telemetry face sheet for immediately available ER MD    Location [facility]    Staff Present Noah Tickle, BS, Exercise Physiologist;Jessica Hawkins, MA, RCEP, CCRP, CCET;Kara Langdon, MS, ASCM CEP, Exercise Physiologist    Virtual Visit No    Medication changes reported     No    Fall or balance concerns reported    No    Warm-up and Cool-down Not performed (comment)   6MWT and gym orientation   Resistance Training Performed No    VAD Patient? No    PAD/SET Patient? No      Pain Assessment   Currently in Pain? No/denies               6 Minute Walk     Row Name 02/16/22 1428         6 Minute Walk   Phase Initial     Distance 695 feet     Walk Time 6 minutes     # of Rest Breaks 0     MPH 1.31     METS 1.2     RPE 11     Perceived Dyspnea  2     VO2 Peak 4.21     Symptoms No     Resting HR 65 bpm     Resting BP 106/64     Resting Oxygen Saturation  98 %     Exercise Oxygen Saturation  during 6 min walk 99 %     Max Ex. HR 80 bpm     Max Ex. BP 130/66     2 Minute Post BP 112/66                Exercise Prescription Changes - 02/16/22 1400       Response to Exercise   Blood Pressure (Admit) 106/64    Blood Pressure (Exercise) 130/66    Blood Pressure (Exit) 112/66    Heart Rate (Admit) 65 bpm    Heart Rate (Exercise) 80 bpm    Heart Rate (Exit) 67 bpm    Oxygen Saturation (Admit) 98 %    Oxygen Saturation (Exercise) 99 %    Oxygen Saturation (Exit) 98 %    Rating of Perceived  Exertion (Exercise) 11    Perceived Dyspnea (Exercise) 2    Symptoms Slight SOB    Comments walk test results             Social History   Tobacco Use  Smoking Status Never  Smokeless Tobacco Never    Goals Met:  Proper associated with RPD/PD & O2 Sat Exercise tolerated well Personal goals reviewed No report of concerns or symptoms today  Goals Unmet:  Not Applicable  Comments: First full day of orientation. All starting workloads were established based on the results of the 6 minute walk test done at initial orientation visit.  The plan for exercise progression was also introduced and progression will be customized based on patient's performance and goals.    Dr. Mark Miller is Medical   Director for [facility].  Dr. Fuad Aleskerov is Medical Director for [facility]. 

## 2022-02-18 ENCOUNTER — Inpatient Hospital Stay: Payer: No Typology Code available for payment source

## 2022-02-18 DIAGNOSIS — Z853 Personal history of malignant neoplasm of breast: Secondary | ICD-10-CM | POA: Diagnosis not present

## 2022-02-18 DIAGNOSIS — Z95828 Presence of other vascular implants and grafts: Secondary | ICD-10-CM

## 2022-02-18 DIAGNOSIS — C8330 Diffuse large B-cell lymphoma, unspecified site: Secondary | ICD-10-CM

## 2022-02-18 DIAGNOSIS — Z452 Encounter for adjustment and management of vascular access device: Secondary | ICD-10-CM | POA: Diagnosis present

## 2022-02-18 MED ORDER — SODIUM CHLORIDE 0.9% FLUSH
10.0000 mL | INTRAVENOUS | Status: DC | PRN
  Administered 2022-02-18: 10 mL via INTRAVENOUS
  Filled 2022-02-18: qty 10

## 2022-02-18 MED ORDER — HEPARIN SOD (PORK) LOCK FLUSH 100 UNIT/ML IV SOLN
500.0000 [IU] | Freq: Once | INTRAVENOUS | Status: AC
  Administered 2022-02-18: 500 [IU] via INTRAVENOUS
  Filled 2022-02-18: qty 5

## 2022-02-18 NOTE — Progress Notes (Signed)
Daily Session Note  Patient Details  Name: Stacey Stone MRN: [id number] Date of Birth: 1945/07/23 Referring Provider:   April Manson Cancer Associated Rehabilitation & Exercise from 02/16/2022 in [facility] Cardiac and Pulmonary Rehab  Referring Provider Delight Hoh MD       Encounter Date: 02/18/2022  Check In:  Session Check In - 02/18/22 1232       Check-In   Supervising physician immediately available to respond to emergencies See telemetry face sheet for immediately available ER MD    Location [facility]-Cardiac & Pulmonary Rehab    Staff Present Coralie Keens, MS, ASCM CEP, Exercise Physiologist;Jessica Luan Pulling, MA, RCEP, CCRP, CCET    Virtual Visit No    Medication changes reported     No    Fall or balance concerns reported    No    Warm-up and Cool-down Performed on first and last piece of equipment    Resistance Training Performed Yes   yoga   VAD Patient? No    PAD/SET Patient? No                Social History   Tobacco Use  Smoking Status Never  Smokeless Tobacco Never    Goals Met:  Proper associated with RPD/PD & O2 Sat Exercise tolerated well Personal goals reviewed No report of concerns or symptoms today Strength training completed today  Goals Unmet:  Not Applicable  Comments: First full day of exercise!  Patient was oriented to gym and equipment including functions, settings, policies, and procedures.  Patient's individual exercise prescription and treatment plan were reviewed.  All starting workloads were established based on the results of the 6 minute walk test done at initial orientation visit.  The plan for exercise progression was also introduced and progression will be customized based on patient's performance and goals.   Yoga completed today.   Dr. Emily Filbert is Medical Director for [place].  Dr. Ottie Glazier is Medical Director for [facility].

## 2022-02-23 ENCOUNTER — Encounter: Payer: No Typology Code available for payment source | Attending: Oncology | Admitting: *Deleted

## 2022-02-23 ENCOUNTER — Inpatient Hospital Stay
Payer: No Typology Code available for payment source | Attending: Nurse Practitioner | Admitting: Licensed Clinical Social Worker

## 2022-02-23 DIAGNOSIS — D61810 Antineoplastic chemotherapy induced pancytopenia: Secondary | ICD-10-CM

## 2022-02-23 DIAGNOSIS — C8330 Diffuse large B-cell lymphoma, unspecified site: Secondary | ICD-10-CM

## 2022-02-23 NOTE — Progress Notes (Signed)
[place] CSW Counseling Note  Patient was referred by medical provider. Treatment type: Individual  Presenting Concerns: Patient and/or family reports the following symptoms/concerns: anxiety and adjustment concerns post-treatment Duration of problem: 6 months; Severity of problem: mild   Orientation:oriented to person, place, time/date, situation, day of week, month of year, and year.   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 of knowledge  Motivation for treatment/growth  Physical Health  Religious Affiliation  Special hobby/interest  Supportive family/friends  Work skills      Goals Addressed: Patient will:  Reduce symptoms of: anxiety and adjustment concerns post-treatment Increase knowledge and/or ability of: stress reduction  Increase healthy adjustment to current life circumstances, Increase adequate support systems for patient/family, and boundary setting and communication   Progress towards Goals: Initial   Interventions: Interventions utilized:  CBT, Strength-based, and Family Systems      Assessment: Patient currently experiencing mild anxiety and adjustment concerns after treatment.  Patient reports she is doing well and is looking forward to returning to work.  Patient stated she is gaining her strength and is adjusting better since treatment completed.  Discussed patient plans for moving closer to family and different  skills she could use to help with managing move.  Discussed practicing clear and precise communication with family members.      Plan: Follow up with CSW: Patient will contact CSW if needed Behavioral recommendations: continue activities, use skills to manage family relations and anxiety Referral(s): Support group(s):  N/A  gave [patient December [facility] event/activity calendar       [place], [place]

## 2022-02-23 NOTE — Progress Notes (Signed)
Daily Session Note  Patient Details  Name: Stacey Stone MRN: [id number] Date of Birth: March 17, 1946 Referring Provider:   April Manson Cancer Associated Rehabilitation & Exercise from 02/16/2022 in [facility] Cardiac and Pulmonary Rehab  Referring Provider Delight Hoh MD       Encounter Date: 02/23/2022  Check In:  Session Check In - 02/23/22 1223       Check-In   Supervising physician immediately available to respond to emergencies See telemetry face sheet for immediately available ER MD    Location [facility] Pul[facility] Re[facility]    Staff Present Alberteen Sam, MA, RCEP, CCRP, Bertram Gala, MS, ACSM CEP, Exercise Physiologist    Virtual Visit No    Medication changes reported     No    Fall or balance concerns reported    No    Warm-up and Cool-down Performed on first and last piece of equipment    Resistance Training Performed Yes    VAD Patient? No    PAD/SET Patient? No      Pain Assessment   Currently in Pain? No/denies                Social History   Tobacco Use  Smoking Status Never  Smokeless Tobacco Never    Goals Met:   Proper associated with RPD/PD & O2 Sat Independence with exercise equipment Exercise tolerated well No report of concerns or symptoms today Strength training completed today Goals Unmet:  Not Applicable  Comments: Pt able to follow exercise prescription today without complaint.  Will continue to monitor for progression.     Dr. Emily Filbert is Medical Director for [place].  Dr. Ottie Glazier is Medical Director for [facility] Pulmonary Rehabilitation.

## 2022-02-25 DIAGNOSIS — C8330 Diffuse large B-cell lymphoma, unspecified site: Secondary | ICD-10-CM

## 2022-02-25 NOTE — Progress Notes (Signed)
Daily Session Note  Patient Details  Name: Stacey Stone MRN: [id number] Date of Birth: 04/14/1945 Referring Provider:   Flowsheet Row Cancer Associated Rehabilitation & Exercise from 02/16/2022 in [facility] Cardiac and Pulmonary Rehab  Referring Provider Finnegan, Timothy MD       Encounter Date: 02/25/2022  Check In:  Session Check In - 02/25/22 1221       Check-In   Supervising physician immediately available to respond to emergencies See telemetry face sheet for immediately available ER MD    Location [facility] Pul[facility] Re[facility]    Staff Present Kara Williamson, MS, ACSM CEP, Exercise Physiologist;Noah Tickle, BS, Exercise Physiologist    Virtual Visit No    Medication changes reported     No    Fall or balance concerns reported    No    Warm-up and Cool-down Performed on first and last piece of equipment    Resistance Training Performed Yes    VAD Patient? No    PAD/SET Patient? No      Pain Assessment   Currently in Pain? No/denies    Multiple Pain Sites No                Social History   Tobacco Use  Smoking Status Never  Smokeless Tobacco Never    Goals Met:  Independence with exercise equipment Exercise tolerated well No report of concerns or symptoms today  Goals Unmet:  Not Applicable  Comments: Pt able to follow exercise prescription today without complaint.  Will continue to monitor for progression.    Dr. Mark Miller is Medical Director for [facility].  Dr. Fuad Aleskerov is Medical Director for [facility] Pulmonary Rehabilitation. 

## 2022-03-02 ENCOUNTER — Encounter: Payer: No Typology Code available for payment source | Admitting: *Deleted

## 2022-03-02 DIAGNOSIS — C8330 Diffuse large B-cell lymphoma, unspecified site: Secondary | ICD-10-CM

## 2022-03-02 NOTE — Progress Notes (Signed)
Daily Session Note  Patient Details  Name: Stacey Stone MRN: [id number] Date of Birth: 11/13/1945 Referring Provider:   Flowsheet Row Cancer Associated Rehabilitation & Exercise from 02/16/2022 in [facility] Cardiac and Pulmonary Rehab  Referring Provider Finnegan, Timothy MD       Encounter Date: 03/02/2022  Check In:  Session Check In - 03/02/22 1222       Check-In   Supervising physician immediately available to respond to emergencies See telemetry face sheet for immediately available ER MD    Location [facility] Re[facility]    Staff Present Jessica Hawkins, MA, RCEP, CCRP, CCET;Kara Williamson, MS, ACSM CEP, Exercise Physiologist    Virtual Visit No    Medication changes reported     No    Fall or balance concerns reported    No    Warm-up and Cool-down Performed on first and last piece of equipment    Resistance Training Performed Yes    VAD Patient? No    PAD/SET Patient? No      Pain Assessment   Currently in Pain? No/denies                Social History   Tobacco Use  Smoking Status Never  Smokeless Tobacco Never    Goals Met:  Proper associated with RPD/PD & O2 Sat Exercise tolerated well No report of concerns or symptoms today Strength training completed today  Goals Unmet:  Not Applicable  Comments: Pt able to follow exercise prescription today without complaint.  Will continue to monitor for progression.    Dr. Mark Miller is Medical Director for [facility].  Dr. Fuad Aleskerov is Medical Director for [facility]. 

## 2022-03-03 ENCOUNTER — Encounter: Payer: Self-pay | Admitting: Oncology

## 2022-03-04 DIAGNOSIS — C8330 Diffuse large B-cell lymphoma, unspecified site: Secondary | ICD-10-CM

## 2022-03-04 NOTE — Progress Notes (Signed)
Daily Session Note  Patient Details  Name: MAKENZE ELLETT MRN: [id number] Date of Birth: 29-Aug-1945 Referring Provider:   April Manson Cancer Associated Rehabilitation & Exercise from 02/16/2022 in [facility] Cardiac and Pulmonary Rehab  Referring Provider Delight Hoh MD       Encounter Date: 03/04/2022  Check In:  Session Check In - 03/04/22 1219       Check-In   Supervising physician immediately available to respond to emergencies See telemetry face sheet for immediately available ER MD    Location [facility] Re[facility]    Staff Present Antionette Fairy, BS, Exercise Physiologist;Kara Maricela Bo, MS, ACSM CEP, Exercise Physiologist    Virtual Visit No    Medication changes reported     Yes    Comments decreased thyroid medication    Fall or balance concerns reported    No    Warm-up and Cool-down Performed on first and last piece of equipment    Resistance Training Performed Yes    VAD Patient? No    PAD/SET Patient? No      Pain Assessment   Currently in Pain? No/denies    Multiple Pain Sites No                Social History   Tobacco Use  Smoking Status Never  Smokeless Tobacco Never    Goals Met:  Independence with exercise equipment Exercise tolerated well No report of concerns or symptoms today  Goals Unmet:  Not Applicable  Comments: Pt able to follow exercise prescription today without complaint.  Will continue to monitor for progression.    Dr. Emily Filbert is Medical Director for [place].  Dr. Ottie Glazier is Medical Director for [facility] Pulmonary Rehabilitation.

## 2022-03-09 ENCOUNTER — Encounter: Payer: No Typology Code available for payment source | Admitting: *Deleted

## 2022-03-09 DIAGNOSIS — C8330 Diffuse large B-cell lymphoma, unspecified site: Secondary | ICD-10-CM

## 2022-03-09 NOTE — Progress Notes (Signed)
Daily Session Note  Patient Details  Name: Stacey Stone MRN: [id number] Date of Birth: November 03, 1945 Referring Provider:   April Manson Cancer Associated Rehabilitation & Exercise from 02/16/2022 in [name] Cardiac and Pulmonary Rehab  Referring Provider Delight Hoh MD       Encounter Date: 03/09/2022  Check In:  Session Check In - 03/09/22 1217       Check-In   Supervising physician immediately available to respond to emergencies See telemetry face sheet for immediately available ER MD    Location [facility] Pul[facility] Re[facility]    Staff Present Antionette Fairy, BS, Exercise Physiologist;Kara Maricela Bo, MS, ACSM CEP, Exercise Physiologist;Rosalea Withrow Luan Pulling, MA, RCEP, CCRP, CCET    Virtual Visit No    Medication changes reported     No    Fall or balance concerns reported    No    Warm-up and Cool-down Performed on first and last piece of equipment    Resistance Training Performed Yes    VAD Patient? No    PAD/SET Patient? No      Pain Assessment   Currently in Pain? No/denies                Social History   Tobacco Use  Smoking Status Never  Smokeless Tobacco Never    Goals Met:  Proper associated with RPD/PD & O2 Sat Independence with exercise equipment Exercise tolerated well No report of concerns or symptoms today Strength training completed today  Goals Unmet:  Not Applicable  Comments: Pt able to follow exercise prescription today without complaint.  Will continue to monitor for progression.    Dr. Emily Filbert is Medical Director for [place].  Dr. Ottie Glazier is Medical Director for [facility] Pulmonary Rehabilitation.

## 2022-03-10 ENCOUNTER — Encounter (INDEPENDENT_AMBULATORY_CARE_PROVIDER_SITE_OTHER): Payer: 59 | Admitting: Ophthalmology

## 2022-03-11 DIAGNOSIS — C8330 Diffuse large B-cell lymphoma, unspecified site: Secondary | ICD-10-CM

## 2022-03-11 NOTE — Progress Notes (Signed)
Daily Session Note  Patient Details  Name: Stacey Stone MRN: [id number] Date of Birth: November 15, 1945 Referring Provider:   April Manson Cancer Associated Rehabilitation & Exercise from 02/16/2022 in [facility] Cardiac and Pulmonary Rehab  Referring Provider Delight Hoh MD       Encounter Date: 03/11/2022  Check In:  Session Check In - 03/11/22 1221       Check-In   Supervising physician immediately available to respond to emergencies See telemetry face sheet for immediately available ER MD    Location [facility]Card[facility] & Pulmonary Re[facility]    Staff Present Antionette Fairy, BS, Exercise Physiologist;Susanne Bice, RN, BSN, CCRP    Virtual Visit No    Medication changes reported     No    Fall or balance concerns reported    No    Warm-up and Cool-down Performed on first and last piece of equipment    Resistance Training Performed Yes    PAD/SET Patient? No      Pain Assessment   Currently in Pain? No/denies    Multiple Pain Sites No                Social History   Tobacco Use  Smoking Status Never  Smokeless Tobacco Never    Goals Met:  Independence with exercise equipment Exercise tolerated well No report of concerns or symptoms today  Goals Unmet:  Not Applicable  Comments: Pt able to follow exercise prescription today without complaint.  Will continue to monitor for progression.    Dr. Emily Filbert is Medical Director for [place].  Dr. Ottie Glazier is Medical Director for [facility] Pulmonary Rehabilitation.

## 2022-03-16 DIAGNOSIS — C8330 Diffuse large B-cell lymphoma, unspecified site: Secondary | ICD-10-CM

## 2022-03-16 NOTE — Progress Notes (Signed)
Daily Session Note  Patient Details  Name: Stacey Stone MRN: [id number] Date of Birth: [birth year] Referring Provider:   April Manson Cancer Associated Rehabilitation & Exercise from 02/16/2022 in [facility] Cardiac and Pulmonary Rehab  Referring Provider Delight Hoh MD       Encounter Date: 03/16/2022  Check In:  Session Check In - 03/16/22 1208       Check-In   Supervising physician immediately available to respond to emergencies See telemetry face sheet for immediately available ER MD    Location [facility]Card[facility] & Pulmonary Rehab    Staff Present Antionette Fairy, BS, Exercise Physiologist;Jessica Hunter, MA, RCEP, CCRP, CCET    Virtual Visit No    Medication changes reported     No    Fall or balance concerns reported    No    Warm-up and Cool-down Performed on first and last piece of equipment    Resistance Training Performed Yes    VAD Patient? No    PAD/SET Patient? No      Pain Assessment   Multiple Pain Sites No                Social History   Tobacco Use  Smoking Status Never  Smokeless Tobacco Never    Goals Met:  Independence with exercise equipment Exercise tolerated well No report of concerns or symptoms today  Goals Unmet:  Not Applicable  Comments: Pt able to follow exercise prescription today without complaint.  Will continue to monitor for progression.    Dr. Emily Filbert is Medical Director for [place].  Dr. Ottie Glazier is Medical Director for [facility] Pulmonary Rehabilitation.

## 2022-03-18 ENCOUNTER — Encounter: Payer: No Typology Code available for payment source | Admitting: *Deleted

## 2022-03-18 DIAGNOSIS — C8330 Diffuse large B-cell lymphoma, unspecified site: Secondary | ICD-10-CM

## 2022-03-18 NOTE — Progress Notes (Signed)
Daily Session Note  Patient Details  Name: Stacey Stone MRN: [id number] Date of Birth: 1945/07/06 Referring Provider:   April Manson Cancer Associated Rehabilitation & Exercise from 02/16/2022 in [facility] Cardiac and Pulmonary Rehab  Referring Provider Delight Hoh MD       Encounter Date: 03/18/2022  Check In:  Session Check In - 03/18/22 1221       Check-In   Supervising physician immediately available to respond to emergencies See telemetry face sheet for immediately available ER MD    Location [facility]Card[facility] & Pulmonary Rehab    Staff Present Alberteen Sam, MA, RCEP, CCRP, Bertram Gala, MS, ACSM CEP, Exercise Physiologist    Virtual Visit No    Medication changes reported     No    Fall or balance concerns reported    No    Warm-up and Cool-down Performed on first and last piece of equipment    Resistance Training Performed Yes    VAD Patient? No    PAD/SET Patient? No      Pain Assessment   Currently in Pain? No/denies                Social History   Tobacco Use  Smoking Status Never  Smokeless Tobacco Never    Goals Met:  Proper associated with RPD/PD & O2 Sat Independence with exercise equipment Exercise tolerated well No report of concerns or symptoms today Strength training completed today  Goals Unmet:  Not Applicable  Comments: Pt able to follow exercise prescription today without complaint.  Will continue to monitor for progression.    Dr. Emily Filbert is Medical Director for [place].  Dr. Ottie Glazier is Medical Director for [facility] Pulmonary Rehabilitation.

## 2022-03-23 ENCOUNTER — Encounter: Payer: Medicare Other | Attending: Oncology

## 2022-03-23 DIAGNOSIS — C8330 Diffuse large B-cell lymphoma, unspecified site: Secondary | ICD-10-CM | POA: Insufficient documentation

## 2022-03-23 NOTE — Progress Notes (Signed)
Daily Session Note  Patient Details  Name: Stacey Stone MRN: [id number] Date of Birth: [birth year] Referring Provider:   April Manson Cancer Associated Rehabilitation & Exercise from 02/16/2022 in [facility] Cardiac and Pulmonary Rehab  Referring Provider Delight Hoh MD       Encounter Date: 03/23/2022  Check In:  Session Check In - 03/23/22 1625       Check-In   Supervising physician immediately available to respond to emergencies See telemetry face sheet for immediately available ER MD    Location [facility]Cardiac & Pulmonary Rehab    Staff Present Larna Daughters, MS, ACSM CEP, Exercise Physiologist;Jessica Luan Pulling, MA, RCEP, CCRP, CCET    Virtual Visit No    Medication changes reported     No    Fall or balance concerns reported    No    Warm-up and Cool-down Performed on first and last piece of equipment    Resistance Training Performed Yes    VAD Patient? No    PAD/SET Patient? No                Social History   Tobacco Use  Smoking Status Never  Smokeless Tobacco Never    Goals Met:  Proper associated with RPD/PD & O2 Sat Independence with exercise equipment Exercise tolerated well No report of concerns or symptoms today Strength training completed today  Goals Unmet:  Not Applicable  Comments: Pt able to follow exercise prescription today without complaint.  Will continue to monitor for progression.    Dr. Emily Filbert is Medical Director for [place].  Dr. Ottie Glazier is Medical Director for [facility] Pulmonary Rehabilitation.

## 2022-03-30 ENCOUNTER — Encounter: Payer: Self-pay | Admitting: Oncology

## 2022-03-31 ENCOUNTER — Ambulatory Visit
Admission: RE | Admit: 2022-03-31 | Discharge: 2022-03-31 | Disposition: A | Discharge status: Home / Self Care | Payer: No Typology Code available for payment source | Source: Ambulatory Visit | Attending: Oncology | Admitting: Oncology

## 2022-03-31 DIAGNOSIS — C8330 Diffuse large B-cell lymphoma, unspecified site: Secondary | ICD-10-CM | POA: Diagnosis not present

## 2022-03-31 MED ORDER — IOHEXOL 300 MG/ML  SOLN
100.0000 mL | Freq: Once | INTRAMUSCULAR | Status: AC | PRN
  Administered 2022-03-31: 100 mL via INTRAVENOUS

## 2022-04-01 DIAGNOSIS — C8330 Diffuse large B-cell lymphoma, unspecified site: Secondary | ICD-10-CM

## 2022-04-01 NOTE — Progress Notes (Signed)
Daily Session Note  Patient Details  Name: Stacey Stone MRN: [id number] Date of Birth: 01/21/1946 Referring Provider:   April Manson Cancer Associated Rehabilitation & Exercise from 02/16/2022 in [facility] Cardiac and Pulmonary Rehab  Referring Provider Delight Hoh MD       Encounter Date: 04/01/2022  Check In:  Session Check In - 04/01/22 1222       Check-In   Supervising physician immediately available to respond to emergencies See telemetry face sheet for immediately available ER MD    Location [facility]-Cardiac & Pulmonary Rehab    Staff Present Larna Daughters, MS, ACSM CEP, Exercise Physiologist;Brynden Thune, BS, Exercise Physiologist    Virtual Visit No    Medication changes reported     No    Fall or balance concerns reported    No    Warm-up and Cool-down Performed on first and last piece of equipment    Resistance Training Performed Yes    VAD Patient? No    PAD/SET Patient? No      Pain Assessment   Currently in Pain? No/denies    Multiple Pain Sites No                Social History   Tobacco Use  Smoking Status Never  Smokeless Tobacco Never    Goals Met:  Independence with exercise equipment Exercise tolerated well No report of concerns or symptoms today  Goals Unmet:  Not Applicable  Comments: Pt able to follow exercise prescription today without complaint.  Will continue to monitor for progression.    Dr. Emily Filbert is Medical Director for [place].  Dr. Ottie Glazier is Medical Director for [facility] Pulmonary Rehabilitation.

## 2022-04-02 ENCOUNTER — Encounter: Payer: Self-pay | Admitting: Oncology

## 2022-04-06 ENCOUNTER — Inpatient Hospital Stay: Payer: No Typology Code available for payment source | Attending: Nurse Practitioner | Admitting: Oncology

## 2022-04-06 ENCOUNTER — Encounter: Payer: Self-pay | Admitting: Oncology

## 2022-04-06 ENCOUNTER — Inpatient Hospital Stay: Payer: No Typology Code available for payment source

## 2022-04-06 ENCOUNTER — Encounter: Payer: Medicare Other | Admitting: *Deleted

## 2022-04-06 VITALS — BP 160/94 | HR 57 | Temp 97.6°F | Resp 18 | Wt 148.6 lb

## 2022-04-06 DIAGNOSIS — Z853 Personal history of malignant neoplasm of breast: Secondary | ICD-10-CM | POA: Insufficient documentation

## 2022-04-06 DIAGNOSIS — Z8572 Personal history of non-Hodgkin lymphomas: Secondary | ICD-10-CM | POA: Insufficient documentation

## 2022-04-06 DIAGNOSIS — I10 Essential (primary) hypertension: Secondary | ICD-10-CM | POA: Insufficient documentation

## 2022-04-06 DIAGNOSIS — Z1231 Encounter for screening mammogram for malignant neoplasm of breast: Secondary | ICD-10-CM

## 2022-04-06 DIAGNOSIS — C8330 Diffuse large B-cell lymphoma, unspecified site: Secondary | ICD-10-CM

## 2022-04-06 DIAGNOSIS — Z780 Asymptomatic menopausal state: Secondary | ICD-10-CM | POA: Diagnosis not present

## 2022-04-06 DIAGNOSIS — D696 Thrombocytopenia, unspecified: Secondary | ICD-10-CM | POA: Insufficient documentation

## 2022-04-06 DIAGNOSIS — Z08 Encounter for follow-up examination after completed treatment for malignant neoplasm: Secondary | ICD-10-CM

## 2022-04-06 DIAGNOSIS — Z95828 Presence of other vascular implants and grafts: Secondary | ICD-10-CM

## 2022-04-06 LAB — CBC WITH DIFFERENTIAL/PLATELET
Abs Immature Granulocytes: 0.01 10*3/uL (ref 0.00–0.07)
Basophils Absolute: 0 10*3/uL (ref 0.0–0.1)
Basophils Relative: 1 %
Eosinophils Absolute: 0.1 10*3/uL (ref 0.0–0.5)
Eosinophils Relative: 2 %
HCT: 35.9 % — ABNORMAL LOW (ref 36.0–46.0)
Hemoglobin: 11.6 g/dL — ABNORMAL LOW (ref 12.0–15.0)
Immature Granulocytes: 0 %
Lymphocytes Relative: 32 %
Lymphs Abs: 1.5 10*3/uL (ref 0.7–4.0)
MCH: 31.5 pg (ref 26.0–34.0)
MCHC: 32.3 g/dL (ref 30.0–36.0)
MCV: 97.6 fL (ref 80.0–100.0)
Monocytes Absolute: 0.4 10*3/uL (ref 0.1–1.0)
Monocytes Relative: 8 %
Neutro Abs: 2.6 10*3/uL (ref 1.7–7.7)
Neutrophils Relative %: 57 %
Platelets: 110 10*3/uL — ABNORMAL LOW (ref 150–400)
RBC: 3.68 MIL/uL — ABNORMAL LOW (ref 3.87–5.11)
RDW: 13.6 % (ref 11.5–15.5)
WBC: 4.5 10*3/uL (ref 4.0–10.5)
nRBC: 0 % (ref 0.0–0.2)

## 2022-04-06 LAB — COMPREHENSIVE METABOLIC PANEL
ALT: 15 U/L (ref 0–44)
AST: 24 U/L (ref 15–41)
Albumin: 3.7 g/dL (ref 3.5–5.0)
Alkaline Phosphatase: 155 U/L — ABNORMAL HIGH (ref 38–126)
Anion gap: 7 (ref 5–15)
BUN: 14 mg/dL (ref 8–23)
CO2: 26 mmol/L (ref 22–32)
Calcium: 8.9 mg/dL (ref 8.9–10.3)
Chloride: 105 mmol/L (ref 98–111)
Creatinine, Ser: 0.76 mg/dL (ref 0.44–1.00)
GFR, Estimated: >60 mL/min (ref >60)
Glucose, Bld: 108 mg/dL — ABNORMAL HIGH (ref 70–99)
Potassium: 3.8 mmol/L (ref 3.5–5.1)
Sodium: 138 mmol/L (ref 135–145)
Total Bilirubin: 0.5 mg/dL (ref 0.3–1.2)
Total Protein: 6.4 g/dL — ABNORMAL LOW (ref 6.5–8.1)

## 2022-04-06 LAB — MAGNESIUM: Magnesium: 2.2 mg/dL (ref 1.7–2.4)

## 2022-04-06 MED ORDER — SODIUM CHLORIDE 0.9% FLUSH
10.0000 mL | Freq: Once | INTRAVENOUS | Status: AC
  Administered 2022-04-06: 10 mL via INTRAVENOUS
  Filled 2022-04-06: qty 10

## 2022-04-06 MED ORDER — HEPARIN SOD (PORK) LOCK FLUSH 100 UNIT/ML IV SOLN
500.0000 [IU] | Freq: Once | INTRAVENOUS | Status: AC
  Administered 2022-04-06: 500 [IU] via INTRAVENOUS
  Filled 2022-04-06: qty 5

## 2022-04-06 NOTE — Progress Notes (Signed)
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 Balance is finally getting better. 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 Pt has a knot in hand( tendons ? ) Left hand.  WBC's and platelets are still low 3 months out from treatment.. Pt never did bone density as she was in the hospital. Mammogram is scheduled by Dr Zachery Dauer he should be calling her as it is due in February.

## 2022-04-06 NOTE — Progress Notes (Signed)
Daily Session Note  Patient Details  Name: Stacey Stone MRN: [id number] Date of Birth: November 22, 1945 Referring Provider:   April Manson Cancer Associated Rehabilitation & Exercise from 02/16/2022 in [facility] Cardiac and Pulmonary Rehab  Referring Provider Delight Hoh MD       Encounter Date: 04/06/2022  Check In:  Session Check In - 04/06/22 1215       Check-In   Supervising physician immediately available to respond to emergencies See telemetry face sheet for immediately available ER MD    Location [facility]Card[facility] Pulmonary Rehab    Staff Present Larna Daughters, MS, ACSM CEP, Exercise Physiologist;Noah Tickle, BS, Exercise Physiologist;Ludy Messamore Luan Pulling, MA, RCEP, CCRP, CCET    Virtual Visit No    Medication changes reported     No    Fall or balance concerns reported    No    Warm-up and Cool-down Performed on first and last piece of equipment    Resistance Training Performed Yes    VAD Patient? No    PAD/SET Patient? No      Pain Assessment   Currently in Pain? No/denies                Social History   Tobacco Use  Smoking Status Never  Smokeless Tobacco Never    Goals Met:  Proper associated with RPD/PD & O2 Sat Independence with exercise equipment Exercise tolerated well No report of concerns or symptoms today Strength training completed today  Goals Unmet:  Not Applicable  Comments: Pt able to follow exercise prescription today without complaint.  Will continue to monitor for progression.    Dr. Emily Filbert is Medical Director for [place].  Dr. Ottie Glazier is Medical Director for [facility] Pulmonary Rehabilitation.

## 2022-04-06 NOTE — Progress Notes (Signed)
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  INTERVAL HISTORY: Patient returns to clinic today for further evaluation and discussion of her imaging results.  She currently feels well and is asymptomatic.  She is now back to work and also in exercise classes twice per week.  She has no neurologic complaints.  She denies any recent fevers or illnesses.  She denies any night sweats or weight loss.  She denies any chest pain, cough, shortness of breath, or hemoptysis.  She denies any nausea, vomiting, constipation, or diarrhea. She has no urinary complaints.  Patient offers no specific complaints today.  REVIEW OF SYSTEMS:   Review of Systems  Constitutional: Negative.  Negative for chills, diaphoresis, fever, malaise/fatigue and weight loss.  Respiratory: Negative.  Negative for cough and shortness of breath.   Cardiovascular: Negative.  Negative for chest pain and leg swelling.  Gastrointestinal: Negative.  Negative for abdominal pain.  Genitourinary: Negative.  Negative for dysuria.  Musculoskeletal: Negative.  Negative for back pain.  Skin: Negative.  Negative for rash.  Neurological: Negative.  Negative for dizziness, sensory change, focal weakness, weakness and headaches.  Psychiatric/Behavioral: Negative.  The patient is not nervous/anxious.     As per HPI. Otherwise, a complete review of systems is negative.  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 MammaPrint was reported as low risk, therefore she did not require adjuvant chemotherapy. She completed adjuvant XRT. Will complete 5 year of letrozole in July 2023.   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  Patient's most recent CT scan on April 28, 2018 revealed no evidence of progressive or recurrent disease.    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[place])  07/05/2014 Initial Diagnosis   MALT (mucosa associated lymphoid tissue)   Primary cancer of upper outer quadrant of left female breast ([place])  05/09/2016 Initial Diagnosis   Primary cancer of upper outer quadrant of left female breast ([place])   DLBCL (diffuse large B cell lymphoma) ([place])  09/10/2021 Initial Diagnosis   DLBCL (diffuse large B cell lymphoma) ([place])   09/17/2021 - 10/30/2021 Chemotherapy   Patient is on Treatment Plan : NON-HODGKINS LYMPHOMA R-CHOP q21d     09/17/2021 - 12/18/2021 Chemotherapy   Patient is on Treatment Plan : NON-HODGKINS LYMPHOMA R-CHOP q21d     09/29/2021 Cancer Staging   Staging form: Hodgkin and Non-Hodgkin Lymphoma, AJCC 8th Edition - Clinical stage from 09/29/2021: Stage III (Diffuse large B-cell lymphoma) - Signed by Lloyd Huger, MD on 09/29/2021 Stage prefix: Initial diagnosis     PAST MEDICAL HISTORY: Past Medical History:  Diagnosis Date   Abdominal pain, left lower quadrant    epiploic appendagitis   Anginal pain ([place])    Arthritis 06/06/2018   knee   Asthma    mild   Atrial fibrillation (HCC)    Breast cancer (HCC)    Coronary artery disease 06/06/2018   1 artery 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 Mamie Nick. at [facility] in  [place]   Depression    Depression 12/20/2021   Diabetes mellitus without complication (HCC)    Dyspnea    Dysrhythmia    GERD (gastroesophageal reflux disease)    Hypertension    Hypothyroidism    MALT (mucosa associated lymphoid tissue) ([place]) 07/05/2014   Right neck mass  resected 01/2014.   No pertinent past medical history    Pancytopenia ([place])    Personal history of radiation therapy    Sleep apnea 06/06/2018   O2- 2l at bedtime and BiPap @ bedtime    PAST SURGICAL HISTORY: Past Surgical History:  Procedure Laterality Date   BREAST BIOPSY Left 2/136/2018   INVASIVE MAMMARY CARCINOMA   BREAST BIOPSY Right 05/25/2016   FIBROCYSTIC CHANGE WITH CALCIFICATIONS    BREAST BIOPSY Right 05/29/2020   Affirm bx-"X" clip-FIBROADENOMA WITH ASSOCIATED CALCIFICATIONS. - NEGATIVE   BREAST EXCISIONAL BIOPSY Left 06/04/2016   INVASIVE MAMMARY CARCINOMA.    BREAST LUMPECTOMY Left 06/04/2016   INVASIVE MAMMARY CARCINOMA.    CARDIAC CATHETERIZATION     CARDIAC CATHETERIZATION N/A 09/24/2015   Procedure: Left Heart Cath and Coronary Angiography;  Surgeon: Isaias Cowman, MD;  Location: [place] [facility];  Service: Cardiovascular;  Laterality: N/A;   CARDIAC CATHETERIZATION N/A 09/24/2015   Procedure: Coronary Stent Intervention;  Surgeon: Isaias Cowman, MD;  Location: [place] [facility];  Service: Cardiovascular;  Laterality: N/A;   CHOLECYSTECTOMY  11/26   08/1970   COLONOSCOPY WITH PROPOFOL N/A 07/08/2021   Procedure: COLONOSCOPY WITH PROPOFOL;  Surgeon: Toledo, Benay Pike, MD;  Location: [facility];  Service: Gastroenterology;  Laterality: N/A;   DILATATION & CURETTAGE/HYSTEROSCOPY WITH MYOSURE N/A 05/05/2015   Procedure: Hysteroscopy and endometrial curretage;  Surgeon: Boykin Nearing, MD;  Location: [facility];  Service: Gynecology;  Laterality: N/A;   DILATION AND CURETTAGE OF UTERUS     EXCISION MASS FROM NECK  Right    Dr. Tami Ribas   EYE SURGERY  05/29/2020   bil cataract 9/08   HAMMER TOE SURGERY Right    IR BONE MARROW BIOPSY & ASPIRATION  09/09/2021   IR IMAGING GUIDED PORT INSERTION  09/09/2021   JOINT REPLACEMENT Bilateral 2008, 11/26/ 2012   Partial Knee Replacements   LEFT HEART CATH AND CORONARY ANGIOGRAPHY N/A 10/25/2017    Procedure: LEFT HEART CATH AND CORONARY ANGIOGRAPHY;  Surgeon: Teodoro Spray, MD;  Location: [place] [facility];  Service: Cardiovascular;  Laterality: N/A;   LEFT HEART CATH AND CORONARY ANGIOGRAPHY N/A 10/26/2017   Procedure: LEFT HEART CATH AND CORONARY ANGIOGRAPHY;  Surgeon: Isaias Cowman, MD;  Location: [place] [facility];  Service: Cardiovascular;  Laterality: N/A;   PARTIAL MASTECTOMY WITH NEEDLE LOCALIZATION Left 06/04/2016   Procedure: PARTIAL MASTECTOMY WITH NEEDLE LOCALIZATION;  Surgeon: Leonie Green, MD;  Location: [facility];  Service: General;  Laterality: Left;   SCLERAL BUCKLE  02/16/2011   Procedure: SCLERAL BUCKLE;  Surgeon: Hayden Pedro, MD;  Location: [place];  Service: Ophthalmology;  Laterality: Left;  Scleral Buckle Left Eye with Headscope Laser   SENTINEL NODE BIOPSY Left 06/04/2016   Procedure: SENTINEL NODE BIOPSY;  Surgeon: Leonie Green, MD;  Location: [facility];  Service: General;  Laterality: Left;    FAMILY HISTORY Family History  Problem Relation Age of Onset   Breast cancer Mother 51   Heart disease Father    Breast cancer Paternal Aunt 67   Breast cancer Cousin    Anesthesia problems Neg Hx    Hypotension Neg Hx  Malignant hyperthermia Neg Hx    Pseudochol deficiency Neg Hx     GYNECOLOGIC HISTORY:  No LMP recorded. Patient is postmenopausal.     ADVANCED DIRECTIVES:    HEALTH MAINTENANCE: Social History   Tobacco Use   Smoking status: Never   Smokeless tobacco: Never  Vaping Use   Vaping Use: Never used  Substance Use Topics   Alcohol use: No   Drug use: No     Colonoscopy:  PAP:  Bone density:  Mammogram: November 2016  Allergies  Allergen Reactions   Codeine Other (See Comments)    Stroke like symptoms   Tramadol Itching    Current Outpatient Medications  Medication Sig Dispense Refill   acetaminophen (TYLENOL) 500 MG tablet Take 500 mg by mouth every 4 (four) hours as needed.     apixaban  (ELIQUIS) 5 MG TABS tablet Take 1 tablet (5 mg total) by mouth 2 (two) times daily. HOLD till you get the Blood work done on 12/10/21 and d/w Dr Grayland Ormond to see if you can restart it 60 tablet 1   atorvastatin (LIPITOR) 40 MG tablet Take 1 tablet (40 mg total) by mouth at bedtime.     Calcium Carbonate (CALCIUM 500 PO) Take by mouth.     cyanocobalamin (VITAMIN B12) 1000 MCG tablet Take 1,000 mcg by mouth daily.     ferrous sulfate 325 (65 FE) MG tablet Take 325 mg by mouth daily with breakfast.     Fluticasone-Salmeterol (ADVAIR) 100-50 MCG/DOSE AEPB Inhale 1 puff into the lungs 2 (two) times daily as needed (for asthma.).     levothyroxine (SYNTHROID) 125 MCG tablet Take 125 mcg by mouth daily.     magnesium oxide (MAG-OX) 400 (240 Mg) MG tablet Take 1 tablet (400 mg total) by mouth 2 (two) times daily. 60 tablet 0   Multiple Vitamin (MULTIVITAMIN WITH MINERALS) TABS tablet Take 1 tablet by mouth daily. 30 tablet 0   ondansetron (ZOFRAN) 8 MG tablet Take by mouth every 8 (eight) hours as needed for nausea or vomiting.     RABEprazole (ACIPHEX) 20 MG tablet Take 20 mg by mouth daily.     sertraline (ZOLOFT) 50 MG tablet Take 50 mg by mouth at bedtime.     sotalol (BETAPACE) 80 MG tablet Take 80 mg by mouth 2 (two) times daily.     feeding supplement, GLUCERNA SHAKE, (GLUCERNA SHAKE) LIQD Take 237 mLs by mouth 3 (three) times daily between meals. (Patient not taking: Reported on 02/15/2022) 237 mL 0   isosorbide mononitrate (IMDUR) 30 MG 24 hr tablet Take 30 mg by mouth daily. (Patient not taking: Reported on 02/15/2022)     nitroGLYCERIN (NITROSTAT) 0.4 MG SL tablet Place 0.4 mg under the tongue every 5 (five) minutes as needed for chest pain. Maximum 3 doses, If no relief call md or 911. (Patient not taking: Reported on 04/06/2022)     No current facility-administered medications for this visit.   Facility-Administered Medications Ordered in Other Visits  Medication Dose Route Frequency Provider  Last Rate Last Admin   acetaminophen (TYLENOL) 325 MG tablet            diphenhydrAMINE (BENADRYL) 25 mg capsule            heparin lock flush 100 UNIT/ML injection            heparin lock flush 100 UNIT/ML injection            palonosetron (ALOXI) 0.25 MG/5ML injection  OBJECTIVE: BP (!) 160/94 (BP Location: Right Arm, Patient Position: Sitting)   Pulse (!) 57   Temp 97.6 F (36.4 C) (Tympanic)   Resp 18   Wt 148 lb 9.6 oz (67.4 kg)   SpO2 100%   BMI 26.16 kg/m    Body mass index is 26.16 kg/m.    ECOG FS:1 - Symptomatic but completely ambulatory  General: Well-developed, well-nourished, no acute distress. Eyes: Pink conjunctiva, anicteric sclera. HEENT: Normocephalic, moist mucous membranes. Lungs: No audible wheezing or coughing. Heart: Regular rate and rhythm. Abdomen: Soft, nontender, no obvious distention. Musculoskeletal: No edema, cyanosis, or clubbing. Neuro: Alert, answering all questions appropriately. Cranial nerves grossly intact. Skin: No rashes or petechiae noted. Psych: Normal affect. Lymphatics: No palpable lymphadenopathy.  LAB RESULTS:  CBC    Component Value Date/Time   WBC 4.5 04/06/2022 1033   RBC 3.68 (L) 04/06/2022 1033   HGB 11.6 (L) 04/06/2022 1033   HGB 13.3 07/03/2014 1503   HCT 35.9 (L) 04/06/2022 1033   HCT 40.8 07/03/2014 1503   PLT 110 (L) 04/06/2022 1033   PLT 240 07/03/2014 1503   MCV 97.6 04/06/2022 1033   MCV 80 07/03/2014 1503   MCH 31.5 04/06/2022 1033   MCHC 32.3 04/06/2022 1033   RDW 13.6 04/06/2022 1033   RDW 15.3 (H) 07/03/2014 1503   LYMPHSABS 1.5 04/06/2022 1033   LYMPHSABS 2.8 07/03/2014 1503   MONOABS 0.4 04/06/2022 1033   MONOABS 0.5 07/03/2014 1503   EOSABS 0.1 04/06/2022 1033   EOSABS 0.3 07/03/2014 1503   BASOSABS 0.0 04/06/2022 1033   BASOSABS 0.1 07/03/2014 1503     STUDIES: No results found.  ASSESSMENT: Stage III diffuse large B-cell lymphoma.  PLAN:    Stage III diffuse large B-cell  lymphoma: PET scan and biopsy results reviewed independently.  Case discussed with pathology confirming this is not a recurrence of patient's MALT lymphoma.  Bone marrow biopsy did not reveal any evidence of lymphoma.  Repeat PET scan results from December 09, 2021 after 4 cycles of R-CHOP reviewed independently with complete metabolic response of patient's lymphoma.  Patient then received 1 dose of R-CVP on December 17, 2021, but given significant toxicity chemotherapy was subsequently discontinued permanently.  No further treatments are planned at this time.  Repeat CT scan on March 31, 2022 reviewed independently and reported as above with no obvious evidence of recurrent disease.  No intervention is needed at this time.  Return to clinic in 6 months with repeat imaging and further evaluation.   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 MammaPrint was reported as low risk, therefore she did not require adjuvant chemotherapy. She completed adjuvant XRT.  Patient completed 5 years of letrozole in June 2023.  Her most recent mammogram on May 20, 2021 was reported as BI-RADS 1.  Repeat mammogram in March 2024.   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  Case discussed with pathology.  This is a different primary.  Imaging as above. Left renal lesion: Stable, consistent with a benign cyst.   Bone mineral density: Patient's most recent bone mineral density on November 13, 2020 revealed a T score of -2.0 which is slightly worse than 1 year prior where her T score was reported -1.7.  Continue calcium and vitamin D supplementation.  Repeat bone mineral density in March 2024 along with mammogram as above.   Anemia: Hemoglobin improved to 11.6. Neutropenia:  Resolved. Thrombocytopenia: Mild, monitor.  Patient's platelet count is 110 today. COVID: Resolved. Recurrent UTI: Resolved. Hypertension: Patient's blood pressure is mildly elevated today.  Continue monitoring and treatment per primary care.  Patient expressed understanding and was in agreement with this plan. She also understands that She can call clinic at any time with any questions, concerns, or complaints.     Lloyd Huger, MD   04/06/2022 11:47 AM

## 2022-04-06 NOTE — Progress Notes (Signed)
Survivorship Care Plan visit completed.  Treatment summary reviewed and given to patient.  ASCO answers booklet reviewed and given to patient.  CARE program and Cancer Transitions discussed with patient along with other resources cancer center offers to patients and caregivers.  Patient verbalized understanding. 

## 2022-04-08 DIAGNOSIS — C8330 Diffuse large B-cell lymphoma, unspecified site: Secondary | ICD-10-CM

## 2022-04-08 NOTE — Progress Notes (Signed)
Stacey Session Note  Patient Details  Name: Stacey Stone MRN: [id number] Date of Birth: 09-09-1945 Referring Provider:   April Manson Cancer Associated Rehabilitation & Exercise from 02/16/2022 in [facility] Cardiac and Pulmonary Rehab  Referring Provider Delight Hoh MD       Encounter Date: 04/08/2022  Check In:  Session Check In - 04/08/22 1225       Check-In   Supervising physician immediately available to respond to emergencies See telemetry face sheet for immediately available ER MD    Location [facility]-Cardiac & Pulmonary Rehab    Staff Present Antionette Fairy, BS, Exercise Physiologist;Kara Maricela Bo, MS, ACSM CEP, Exercise Physiologist    Virtual Visit No    Medication changes reported     No    Fall or balance concerns reported    No    Warm-up and Cool-down Performed on first and last piece of equipment    Resistance Training Performed Yes    VAD Patient? No    PAD/SET Patient? No      Pain Assessment   Currently in Pain? No/denies    Multiple Pain Sites No                Social History   Tobacco Use  Smoking Status Never  Smokeless Tobacco Never    Goals Met:  Independence with exercise equipment Exercise tolerated well No report of concerns or symptoms today  Goals Unmet:  Not Applicable  Comments: Pt able to follow exercise prescription today without complaint.  Will continue to monitor for progression.    Dr. Emily Filbert is Medical Director for [place].  Dr. Ottie Glazier is Medical Director for [facility] Pulmonary Rehabilitation.

## 2022-04-15 ENCOUNTER — Inpatient Hospital Stay: Payer: Medicare Other

## 2022-04-16 ENCOUNTER — Encounter: Payer: Self-pay | Admitting: Oncology

## 2022-04-16 ENCOUNTER — Encounter (INDEPENDENT_AMBULATORY_CARE_PROVIDER_SITE_OTHER): Payer: No Typology Code available for payment source | Admitting: Ophthalmology

## 2022-04-16 DIAGNOSIS — I10 Essential (primary) hypertension: Secondary | ICD-10-CM | POA: Diagnosis not present

## 2022-04-16 DIAGNOSIS — H43813 Vitreous degeneration, bilateral: Secondary | ICD-10-CM

## 2022-04-16 DIAGNOSIS — H35033 Hypertensive retinopathy, bilateral: Secondary | ICD-10-CM

## 2022-04-16 DIAGNOSIS — H34812 Central retinal vein occlusion, left eye, with macular edema: Secondary | ICD-10-CM

## 2022-04-16 DIAGNOSIS — H338 Other retinal detachments: Secondary | ICD-10-CM | POA: Diagnosis not present

## 2022-04-20 DIAGNOSIS — C8330 Diffuse large B-cell lymphoma, unspecified site: Secondary | ICD-10-CM

## 2022-04-20 NOTE — Progress Notes (Signed)
Daily Session Note  Patient Details  Name: Stacey Stone MRN: [id number] Date of Birth: 06/18/1945 Referring Provider:   April Manson Cancer Associated Rehabilitation & Exercise from 02/16/2022 in [facility] Cardiac and Pulmonary Rehab  Referring Provider Delight Hoh MD       Encounter Date: 04/20/2022  Check In:  Session Check In - 04/20/22 1252       Check-In   Supervising physician immediately available to respond to emergencies See telemetry face sheet for immediately available ER MD    Location [facility]-Cardiac & Pulmonary Rehab    Staff Present Larna Daughters, MS, ACSM CEP, Exercise Physiologist;Jessica Luan Pulling, MA, RCEP, CCRP, CCET    Virtual Visit No    Medication changes reported     Yes    Comments On Amoxicillin, will end on Thurs 2/1    Fall or balance concerns reported    No    Warm-up and Cool-down Performed on first and last piece of equipment    Resistance Training Performed Yes    VAD Patient? No    PAD/SET Patient? No                Social History   Tobacco Use  Smoking Status Never  Smokeless Tobacco Never    Goals Met:  Proper associated with RPD/PD & O2 Sat Independence with exercise equipment Exercise tolerated well No report of concerns or symptoms today Strength training completed today  Goals Unmet:  Not Applicable  Comments: Pt able to follow exercise prescription today without complaint.  Will continue to monitor for progression.    Dr. Emily Filbert is Medical Director for [place].  Dr. Ottie Glazier is Medical Director for [facility] Pulmonary Rehabilitation.

## 2022-04-22 ENCOUNTER — Encounter: Payer: Medicare Other | Attending: Oncology

## 2022-04-22 DIAGNOSIS — C8330 Diffuse large B-cell lymphoma, unspecified site: Secondary | ICD-10-CM | POA: Insufficient documentation

## 2022-04-22 NOTE — Progress Notes (Signed)
Daily Session Note  Patient Details  Name: Stacey Stone MRN: [id number] Date of Birth: Nov 10, 1945 Referring Provider:   April Manson Cancer Associated Rehabilitation & Exercise from 02/16/2022 in [facility] Cardiac and Pulmonary Rehab  Referring Provider Delight Hoh MD       Encounter Date: 04/22/2022  Check In:  Session Check In - 04/22/22 1223       Check-In   Supervising physician immediately available to respond to emergencies See telemetry face sheet for immediately available ER MD    Location [facility]-Cardiac & Pulmonary Rehab    Staff Present Larna Daughters, MS, ACSM CEP, Exercise Physiologist;Samarrah Tranchina, BS, Exercise Physiologist    Virtual Visit No    Medication changes reported     No    Fall or balance concerns reported    No    Warm-up and Cool-down Performed on first and last piece of equipment    Resistance Training Performed Yes    VAD Patient? No    PAD/SET Patient? No      Pain Assessment   Currently in Pain? No/denies    Multiple Pain Sites No                Social History   Tobacco Use  Smoking Status Never  Smokeless Tobacco Never    Goals Met:  Independence with exercise equipment Exercise tolerated well No report of concerns or symptoms today  Goals Unmet:  Not Applicable  Comments: Pt able to follow exercise prescription today without complaint.  Will continue to monitor for progression.    Dr. Emily Filbert is Medical Director for [place].  Dr. Ottie Glazier is Medical Director for [facility] Pulmonary Rehabilitation.

## 2022-04-29 DIAGNOSIS — C8330 Diffuse large B-cell lymphoma, unspecified site: Secondary | ICD-10-CM

## 2022-04-29 NOTE — Progress Notes (Signed)
Daily Session Note  Patient Details  Name: Stacey Stone MRN: [id number] Date of Birth: 1946/01/28 Referring Provider:   April Manson Cancer Associated Rehabilitation & Exercise from 02/16/2022 in [facility] Cardiac and Pulmonary Rehab  Referring Provider Delight Hoh MD       Encounter Date: 04/29/2022  Check In:  Session Check In - 04/29/22 1234       Check-In   Supervising physician immediately available to respond to emergencies See telemetry face sheet for immediately available ER MD    Location [facility]-Cardiac & Pulmonary Rehab    Staff Present Larna Daughters, MS, ACSM CEP, Exercise Physiologist;Bomani Oommen, BS, Exercise Physiologist    Virtual Visit No    Medication changes reported     No    Fall or balance concerns reported    No    Warm-up and Cool-down Performed on first and last piece of equipment    Resistance Training Performed Yes    VAD Patient? No    PAD/SET Patient? No      Pain Assessment   Currently in Pain? No/denies    Multiple Pain Sites No                Social History   Tobacco Use  Smoking Status Never  Smokeless Tobacco Never    Goals Met:  Independence with exercise equipment Exercise tolerated well No report of concerns or symptoms today  Goals Unmet:  Not Applicable  Comments: Pt able to follow exercise prescription today without complaint.  Will continue to monitor for progression.    Dr. Emily Filbert is Medical Director for [place].  Dr. Ottie Glazier is Medical Director for [facility] Pulmonary Rehabilitation.

## 2022-05-01 ENCOUNTER — Encounter: Payer: Self-pay | Admitting: Oncology

## 2022-05-04 DIAGNOSIS — C8330 Diffuse large B-cell lymphoma, unspecified site: Secondary | ICD-10-CM

## 2022-05-04 NOTE — Progress Notes (Signed)
Daily Session Note  Patient Details  Name: Stacey Stone MRN: [id number] Date of Birth: 11-Apr-1945 Referring Provider:   April Manson Cancer Associated Rehabilitation & Exercise from 02/16/2022 in [facility] Cardiac and Pulmonary Rehab  Referring Provider Delight Hoh MD       Encounter Date: 05/04/2022  Check In:  Session Check In - 05/04/22 1305       Check-In   Supervising physician immediately available to respond to emergencies See telemetry face sheet for immediately available ER MD    Location [facility]-Cardiac & Pulmonary Rehab    Staff Present Larna Daughters, MS, ACSM CEP, Exercise Physiologist;Storey Stangeland, BS, Exercise Physiologist;Jessica Luan Pulling, MA, RCEP, CCRP, CCET    Virtual Visit No    Medication changes reported     No    Fall or balance concerns reported    No    Warm-up and Cool-down Performed on first and last piece of equipment    Resistance Training Performed Yes    VAD Patient? No    PAD/SET Patient? No      Pain Assessment   Currently in Pain? No/denies    Multiple Pain Sites No                Social History   Tobacco Use  Smoking Status Never  Smokeless Tobacco Never    Goals Met:  Independence with exercise equipment Exercise tolerated well No report of concerns or symptoms today  Goals Unmet:  Not Applicable  Comments: Pt able to follow exercise prescription today without complaint.  Will continue to monitor for progression.    Dr. Emily Filbert is Medical Director for [place].  Dr. Ottie Glazier is Medical Director for [facility] Pulmonary Rehabilitation.

## 2022-05-06 DIAGNOSIS — C8330 Diffuse large B-cell lymphoma, unspecified site: Secondary | ICD-10-CM

## 2022-05-06 NOTE — Progress Notes (Signed)
Daily Session Note  Patient Details  Name: Stacey Stone MRN: [id number] Date of Birth: 06/23/1945 Referring Provider:   April Manson Cancer Associated Rehabilitation & Exercise from 02/16/2022 in [facility] Cardiac and Pulmonary Rehab  Referring Provider Delight Hoh MD       Encounter Date: 05/06/2022  Check In:  Session Check In - 05/06/22 1221       Check-In   Supervising physician immediately available to respond to emergencies See telemetry face sheet for immediately available ER MD    Location [facility]-Cardiac & Pulmonary Rehab    Staff Present Larna Daughters, MS, ACSM CEP, Exercise Physiologist;Keyetta Hollingworth, BS, Exercise Physiologist    Virtual Visit No    Medication changes reported     No    Fall or balance concerns reported    No    Warm-up and Cool-down Performed on first and last piece of equipment    Resistance Training Performed Yes    VAD Patient? No    PAD/SET Patient? No      Pain Assessment   Currently in Pain? No/denies    Multiple Pain Sites No                Social History   Tobacco Use  Smoking Status Never  Smokeless Tobacco Never    Goals Met:  Independence with exercise equipment Exercise tolerated well No report of concerns or symptoms today  Goals Unmet:  Not Applicable  Comments: Pt able to follow exercise prescription today without complaint.  Will continue to monitor for progression.    Dr. Emily Filbert is Medical Director for [place].  Dr. Ottie Glazier is Medical Director for [facility] Pulmonary Rehabilitation.

## 2022-05-11 ENCOUNTER — Encounter: Payer: Medicare Other | Admitting: *Deleted

## 2022-05-11 VITALS — Ht 63.2 in | Wt 146.7 lb

## 2022-05-11 DIAGNOSIS — C8330 Diffuse large B-cell lymphoma, unspecified site: Secondary | ICD-10-CM

## 2022-05-11 NOTE — Progress Notes (Signed)
Daily Session Note  Patient Details  Name: LADDIE LAMERE MRN: [id number] Date of Birth: 21-Feb-1946 Referring Provider:   April Manson Cancer Associated Rehabilitation & Exercise from 02/16/2022 in [facility] Cardiac and Pulmonary Rehab  Referring Provider Delight Hoh MD       Encounter Date: 05/11/2022  Check In:  Session Check In - 05/11/22 1223       Check-In   Supervising physician immediately available to respond to emergencies See telemetry face sheet for immediately available ER MD    Location [facility]-Cardiac & Pulmonary Rehab    Staff Present Larna Daughters, MS, ACSM CEP, Exercise Physiologist;Keilen Kahl Luan Pulling, MA, RCEP, CCRP, CCET    Virtual Visit No    Medication changes reported     No    Fall or balance concerns reported    No    Warm-up and Cool-down Performed on first and last piece of equipment    Resistance Training Performed Yes    VAD Patient? No    PAD/SET Patient? No      Pain Assessment   Currently in Pain? No/denies              6 Minute Walk     Row Name 02/16/22 1428 05/11/22 1344       6 Minute Walk   Phase Initial Discharge    Distance 695 feet 1040 feet    Distance % Change -- 49.6 %    Distance Feet Change -- 345 ft    Walk Time 6 minutes 6 minutes    # of Rest Breaks 0 0    MPH 1.31 1.97    METS 1.2 2    RPE 11 13    Perceived Dyspnea  2 1    VO2 Peak 4.21 7    Symptoms No No    Resting HR 65 bpm 55 bpm    Resting BP 106/64 136/72    Resting Oxygen Saturation  98 % 96 %    Exercise Oxygen Saturation  during 6 min walk 99 % 96 %    Max Ex. HR 80 bpm 79 bpm    Max Ex. BP 130/66 162/82    2 Minute Post BP 112/66 124/66               Social History   Tobacco Use  Smoking Status Never  Smokeless Tobacco Never    Goals Met:  Proper associated with RPD/PD & O2 Sat Independence with exercise equipment Exercise tolerated well No report of concerns or symptoms today Strength training completed today  Goals  Unmet:  Not Applicable  Comments: Pt able to follow exercise prescription today without complaint.  Will continue to monitor for progression.    Dr. Emily Filbert is Medical Director for [place].  Dr. Ottie Glazier is Medical Director for [facility] Pulmonary Rehabilitation.

## 2022-05-13 DIAGNOSIS — C8330 Diffuse large B-cell lymphoma, unspecified site: Secondary | ICD-10-CM

## 2022-05-13 NOTE — Progress Notes (Signed)
Daily Session Note  Patient Details  Name: Stacey Stone MRN: [id number] Date of Birth: [birth year] Referring Provider:   April Manson Cancer Associated Rehabilitation & Exercise from 02/16/2022 in [facility] Cardiac and Pulmonary Rehab  Referring Provider Delight Hoh MD       Encounter Date: 05/13/2022  Check In:  Session Check In - 05/13/22 1223       Check-In   Supervising physician immediately available to respond to emergencies See telemetry face sheet for immediately available ER MD    Location [facility]Cardiac & Pulmonary Rehab    Staff Present Larna Daughters, MS, ACSM CEP, Exercise Physiologist;Eddie Payette, BS, Exercise Physiologist    Virtual Visit No    Medication changes reported     No    Fall or balance concerns reported    No    Warm-up and Cool-down Performed on first and last piece of equipment    Resistance Training Performed Yes    VAD Patient? No    PAD/SET Patient? No      Pain Assessment   Currently in Pain? No/denies    Multiple Pain Sites No                Social History   Tobacco Use  Smoking Status Never  Smokeless Tobacco Never    Goals Met:  Independence with exercise equipment Exercise tolerated well No report of concerns or symptoms today  Goals Unmet:  Not Applicable  Comments: Pt able to follow exercise prescription today without complaint.  Will continue to monitor for progression.    Dr. Emily Filbert is Medical Director for [place].  Dr. Ottie Glazier is Medical Director for [facility] Pulmonary Rehabilitation.

## 2022-05-18 ENCOUNTER — Encounter: Payer: Self-pay | Admitting: Oncology

## 2022-05-18 DIAGNOSIS — C8330 Diffuse large B-cell lymphoma, unspecified site: Secondary | ICD-10-CM

## 2022-05-18 NOTE — Progress Notes (Addendum)
Daily Session Note  Patient Details  Name: Stacey Stone MRN: [id number] Date of Birth: 04-27-1945 Referring Provider:   April Manson Cancer Associated Rehabilitation & Exercise from 02/16/2022 in [facility] Cardiac and Pulmonary Rehab  Referring Provider Delight Hoh MD       Encounter Date: 05/18/2022  Check In:  Session Check In - 05/18/22 1242       Check-In   Supervising physician immediately available to respond to emergencies See telemetry face sheet for immediately available ER MD    Location [facility]Cardiac & Pulmonary Rehab    Staff Present Larna Daughters, MS, ACSM CEP, Exercise Physiologist;Jessica Luan Pulling, MA, RCEP, CCRP, CCET    Virtual Visit No    Medication changes reported     No    Fall or balance concerns reported    No    Warm-up and Cool-down Performed on first and last piece of equipment    Resistance Training Performed No   yoga   VAD Patient? No    PAD/SET Patient? No                Social History   Tobacco Use  Smoking Status Never  Smokeless Tobacco Never    Goals Met:  Proper associated with RPD/PD & O2 Sat Exercise tolerated well No report of concerns or symptoms today Strength training completed today  Goals Unmet:  Not Applicable  Comments: Pt able to follow exercise prescription today without complaint.  Will continue to monitor for progression.   Yoga completed today by CEP, Alberteen Sam  Noticed lower resting HR in low-mid 50's since earlier this month. Per patient, has been feeling good with no symptoms. Monitor it, notify and call if anything changes. Continue everything the same. Patient verbalized understanding.    Dr. Emily Filbert is Medical Director for [place].  Dr. Ottie Glazier is Medical Director for [facility] Pulmonary Rehabilitation.

## 2022-05-20 DIAGNOSIS — C8330 Diffuse large B-cell lymphoma, unspecified site: Secondary | ICD-10-CM

## 2022-05-20 NOTE — Progress Notes (Signed)
Daily Session Note  Patient Details  Name: Stacey Stone MRN: [id number] Date of Birth: 1945/05/25 Referring Provider:   April Manson Cancer Associated Rehabilitation & Exercise from 02/16/2022 in [facility] Cardiac and Pulmonary Rehab  Referring Provider Delight Hoh MD       Encounter Date: 05/20/2022  Check In:  Session Check In - 05/20/22 1224       Check-In   Supervising physician immediately available to respond to emergencies See telemetry face sheet for immediately available ER MD    Location [facility]Cardiac & Pulmonary Rehab    Staff Present Larna Daughters, MS, ACSM CEP, Exercise Physiologist;Aarib Pulido, BS, Exercise Physiologist    Virtual Visit No    Medication changes reported     No    Fall or balance concerns reported    No    Warm-up and Cool-down Performed on first and last piece of equipment    Resistance Training Performed Yes    VAD Patient? No    PAD/SET Patient? No      Pain Assessment   Currently in Pain? No/denies    Multiple Pain Sites No                Social History   Tobacco Use  Smoking Status Never  Smokeless Tobacco Never    Goals Met:  Independence with exercise equipment Exercise tolerated well No report of concerns or symptoms today  Goals Unmet:  Not Applicable  Comments: Pt able to follow exercise prescription today without complaint.  Will continue to monitor for progression.    Dr. Emily Filbert is Medical Director for [place].  Dr. Ottie Glazier is Medical Director for [facility] Pulmonary Rehabilitation.

## 2022-05-24 ENCOUNTER — Ambulatory Visit
Admission: RE | Admit: 2022-05-24 | Discharge: 2022-05-24 | Disposition: A | Discharge status: Home / Self Care | Payer: Medicare Other | Source: Ambulatory Visit | Attending: Oncology | Admitting: Oncology

## 2022-05-24 DIAGNOSIS — Z1231 Encounter for screening mammogram for malignant neoplasm of breast: Secondary | ICD-10-CM | POA: Insufficient documentation

## 2022-05-24 DIAGNOSIS — Z853 Personal history of malignant neoplasm of breast: Secondary | ICD-10-CM | POA: Diagnosis present

## 2022-05-24 DIAGNOSIS — Z780 Asymptomatic menopausal state: Secondary | ICD-10-CM | POA: Insufficient documentation

## 2022-05-25 ENCOUNTER — Encounter: Payer: Medicare Other | Attending: Oncology | Admitting: *Deleted

## 2022-05-25 DIAGNOSIS — C8330 Diffuse large B-cell lymphoma, unspecified site: Secondary | ICD-10-CM | POA: Insufficient documentation

## 2022-05-25 NOTE — Progress Notes (Signed)
Daily Session Note  Patient Details  Name: Stacey Stone MRN: [id number] Date of Birth: 1945-11-06 Referring Provider:   April Manson Cancer Associated Rehabilitation & Exercise from 02/16/2022 in [facility] Cardiac and Pulmonary Rehab  Referring Provider Delight Hoh MD       Encounter Date: 05/25/2022  Check In:  Session Check In - 05/25/22 1220       Check-In   Supervising physician immediately available to respond to emergencies See telemetry face sheet for immediately available ER MD    Location [facility]-Cardiac & Pulmonary Rehab    Staff Present Larna Daughters, MS, ACSM CEP, Exercise Physiologist;Juni Glaab Luan Pulling, MA, RCEP, CCRP, CCET    Virtual Visit No    Medication changes reported     No    Fall or balance concerns reported    No    Warm-up and Cool-down Performed on first and last piece of equipment    Resistance Training Performed Yes    VAD Patient? No    PAD/SET Patient? No      Pain Assessment   Currently in Pain? No/denies                Social History   Tobacco Use  Smoking Status Never  Smokeless Tobacco Never    Goals Met:  Proper associated with RPD/PD & O2 Sat Independence with exercise equipment Exercise tolerated well No report of concerns or symptoms today Strength training completed today  Goals Unmet:  Not Applicable  Comments: Pt able to follow exercise prescription today without complaint.  Will continue to monitor for progression.    Dr. Emily Filbert is Medical Director for [place].  Dr. Ottie Glazier is Medical Director for [facility] Pulmonary Rehabilitation.

## 2022-05-25 NOTE — Patient Instructions (Signed)
Discharge Patient Instructions  Patient Details  Name: Stacey Stone MRN: [id number] Date of Birth: 1946-03-04 Referring Provider:  Delight Hoh MD   Number of Visits: 24  Reason for Discharge:  Patient reached a stable level of exercise. Patient independent in their exercise. Patient has met program and personal goals.  Smoking History:  Social History   Tobacco Use  Smoking Status Never  Smokeless Tobacco Never    Diagnosis:  Diffuse large B-cell lymphoma, unspecified body region [facility])  Initial Exercise Prescription:  Initial Exercise Prescription - 02/16/22 1400       Date of Initial Exercise RX and Referring Provider   Date 02/16/22    Referring Provider Delight Hoh MD      Oxygen   Maintain Oxygen Saturation 88% or higher      Recumbant Bike   Level 1    RPM 50    Watts 12    Minutes 15    METs 1.2      NuStep   Level 1    SPM 80    Minutes 15    METs 1.2      Biostep-RELP   Level 1    SPM 50    Minutes 15    METs 1.2      Track   Laps 14    Minutes 15    METs 1.76      Prescription Details   Frequency (times per week) 2    Duration Progress to 30 minutes of continuous aerobic without signs/symptoms of physical distress      Intensity   THRR 40-80% of Max Heartrate 95 - 126    Ratings of Perceived Exertion 11-13    Perceived Dyspnea 0-4      Progression   Progression Continue to progress workloads to maintain intensity without signs/symptoms of physical distress.      Resistance Training   Training Prescription Yes    Weight 2 lb    Reps 10-15             Discharge Exercise Prescription (Final Exercise Prescription Changes):  Exercise Prescription Changes - 05/25/22 1500       Response to Exercise   Blood Pressure (Admit) 128/82    Blood Pressure (Exercise) 138/82    Blood Pressure (Exit) 124/62    Heart Rate (Admit) 65 bpm    Heart Rate (Exercise) 89 bpm    Heart Rate (Exit) 64 bpm    Oxygen  Saturation (Admit) 97 %    Oxygen Saturation (Exercise) 95 %    Oxygen Saturation (Exit) 98 %    Rating of Perceived Exertion (Exercise) 13    Duration Continue with 30 min of aerobic exercise without signs/symptoms of physical distress.    Intensity THRR unchanged      Progression   Progression Continue to progress workloads to maintain intensity without signs/symptoms of physical distress.    Average METs 2.38      Resistance Training   Training Prescription Yes    Weight 2 lb    Reps 10-15      Interval Training   Interval Training No      NuStep   Level 3    Minutes 15    METs 1.5      REL-XR   Level 3    Minutes 15    METs 3.4      Biostep-RELP   Level 3    Minutes 15    METs 2  Track   Laps 36    Minutes 15    METs 2.96             Functional Capacity:  6 Minute Walk     Row Name 02/16/22 1428 05/11/22 1344       6 Minute Walk   Phase Initial Discharge    Distance 695 feet 1040 feet    Distance % Change -- 49.6 %    Distance Feet Change -- 345 ft    Walk Time 6 minutes 6 minutes    # of Rest Breaks 0 0    MPH 1.31 1.97    METS 1.2 2    RPE 11 13    Perceived Dyspnea  2 1    VO2 Peak 4.21 7    Symptoms No No    Resting HR 65 bpm 55 bpm    Resting BP 106/64 136/72    Resting Oxygen Saturation  98 % 96 %    Exercise Oxygen Saturation  during 6 min walk 99 % 96 %    Max Ex. HR 80 bpm 79 bpm    Max Ex. BP 130/66 162/82    2 Minute Post BP 112/66 124/66               Nutrition & Weight - Outcomes:  Pre Biometrics - 02/16/22 1423       Pre Biometrics   Height 5' 3.2" (1.605 m)    Weight 148 lb 9.6 oz (67.4 kg)    BMI (Calculated) 26.17    Single Leg Stand 3.13 seconds             Post Biometrics - 05/11/22 1345        Post  Biometrics   Height 5' 3.2" (1.605 m)    Weight 146 lb 11.2 oz (66.5 kg)    BMI (Calculated) 25.83    Single Leg Stand 2.03 seconds              Goals reviewed with patient; copy  given to patient.

## 2022-05-26 ENCOUNTER — Encounter: Payer: Self-pay | Admitting: Oncology

## 2022-05-27 DIAGNOSIS — C8330 Diffuse large B-cell lymphoma, unspecified site: Secondary | ICD-10-CM

## 2022-05-27 NOTE — Progress Notes (Signed)
CARE Discharge Progress Report  Patient Details  Name: Stacey Stone MRN: [id number] Date of Birth: [birth year] Referring Provider:   April Manson Cancer Associated Rehabilitation & Exercise from 02/16/2022 in [facility] Cardiac and Pulmonary Rehab  Referring Provider Delight Hoh MD        Number of Visits: 24  Reason for Discharge:  Patient reached a stable level of exercise. Patient independent in their exercise. Patient has met program and personal goals.  Smoking History:  Social History   Tobacco Use  Smoking Status Never  Smokeless Tobacco Never    Diagnosis:  Diffuse large B-cell lymphoma, unspecified body region [facility])  ADL UCSD:   Initial Exercise Prescription:  Initial Exercise Prescription - 02/16/22 1400       Date of Initial Exercise RX and Referring Provider   Date 02/16/22    Referring Provider Delight Hoh MD      Oxygen   Maintain Oxygen Saturation 88% or higher      Recumbant Bike   Level 1    RPM 50    Watts 12    Minutes 15    METs 1.2      NuStep   Level 1    SPM 80    Minutes 15    METs 1.2      Biostep-RELP   Level 1    SPM 50    Minutes 15    METs 1.2      Track   Laps 14    Minutes 15    METs 1.76      Prescription Details   Frequency (times per week) 2    Duration Progress to 30 minutes of continuous aerobic without signs/symptoms of physical distress      Intensity   THRR 40-80% of Max Heartrate 95 - 126    Ratings of Perceived Exertion 11-13    Perceived Dyspnea 0-4      Progression   Progression Continue to progress workloads to maintain intensity without signs/symptoms of physical distress.      Resistance Training   Training Prescription Yes    Weight 2 lb    Reps 10-15             Discharge Exercise Prescription (Final Exercise Prescription Changes):  Exercise Prescription Changes - 05/25/22 1500       Response to Exercise   Blood Pressure (Admit) 128/82    Blood Pressure  (Exercise) 138/82    Blood Pressure (Exit) 124/62    Heart Rate (Admit) 65 bpm    Heart Rate (Exercise) 89 bpm    Heart Rate (Exit) 64 bpm    Oxygen Saturation (Admit) 97 %    Oxygen Saturation (Exercise) 95 %    Oxygen Saturation (Exit) 98 %    Rating of Perceived Exertion (Exercise) 13    Duration Continue with 30 min of aerobic exercise without signs/symptoms of physical distress.    Intensity THRR unchanged      Progression   Progression Continue to progress workloads to maintain intensity without signs/symptoms of physical distress.    Average METs 2.38      Resistance Training   Training Prescription Yes    Weight 2 lb    Reps 10-15      Interval Training   Interval Training No      NuStep   Level 3    Minutes 15    METs 1.5      REL-XR   Level 3    Minutes  15    METs 3.4      Biostep-RELP   Level 3    Minutes 15    METs 2      Track   Laps 36    Minutes 15    METs 2.96             Functional Capacity:  6 Minute Walk     Row Name 02/16/22 1428 05/11/22 1344       6 Minute Walk   Phase Initial Discharge    Distance 695 feet 1040 feet    Distance % Change -- 49.6 %    Distance Feet Change -- 345 ft    Walk Time 6 minutes 6 minutes    # of Rest Breaks 0 0    MPH 1.31 1.97    METS 1.2 2    RPE 11 13    Perceived Dyspnea  2 1    VO2 Peak 4.21 7    Symptoms No No    Resting HR 65 bpm 55 bpm    Resting BP 106/64 136/72    Resting Oxygen Saturation  98 % 96 %    Exercise Oxygen Saturation  during 6 min walk 99 % 96 %    Max Ex. HR 80 bpm 79 bpm    Max Ex. BP 130/66 162/82    2 Minute Post BP 112/66 124/66              Nutrition & Weight - Outcomes:  Pre Biometrics - 02/16/22 1423       Pre Biometrics   Height 5' 3.2" (1.605 m)    Weight 148 lb 9.6 oz (67.4 kg)    BMI (Calculated) 26.17    Single Leg Stand 3.13 seconds             Post Biometrics - 05/11/22 1345        Post  Biometrics   Height 5' 3.2" (1.605 m)     Weight 146 lb 11.2 oz (66.5 kg)    BMI (Calculated) 25.83    Single Leg Stand 2.03 seconds               Goals reviewed with patient; copy given to patient.

## 2022-05-27 NOTE — Progress Notes (Signed)
Daily Session Note  Patient Details  Name: Stacey Stone MRN: [id number] Date of Birth: 1945-11-11 Referring Provider:   April Manson Cancer Associated Rehabilitation & Exercise from 02/16/2022 in [facility] Cardiac and Pulmonary Rehab  Referring Provider Delight Hoh MD       Encounter Date: 05/27/2022  Check In:  Session Check In - 05/27/22 1253       Check-In   Supervising physician immediately available to respond to emergencies See telemetry face sheet for immediately available ER MD    Location [facility]-Cardiac & Pulmonary Rehab    Staff Present Larna Daughters, MS, ACSM CEP, Exercise Physiologist;Noah Tickle, BS, Exercise Physiologist    Virtual Visit No    Medication changes reported     No    Fall or balance concerns reported    No    Warm-up and Cool-down Performed on first and last piece of equipment    Resistance Training Performed Yes    VAD Patient? No    PAD/SET Patient? No                Social History   Tobacco Use  Smoking Status Never  Smokeless Tobacco Never    Goals Met:  Proper associated with RPD/PD & O2 Sat Independence with exercise equipment Exercise tolerated well Personal goals reviewed No report of concerns or symptoms today Strength training completed today  Goals Unmet:  Not Applicable  Comments:  Stacey Stone graduated today from  rehab with 24 sessions completed.  Details of the patient's exercise prescription and what She needs to do in order to continue the prescription and progress were discussed with patient.  Patient was given a copy of prescription and goals.  Patient verbalized understanding.  Su plans to continue to exercise by going to [employer].    Dr. Emily Filbert is Medical Director for [place].  Dr. Ottie Glazier is Medical Director for [facility] Pulmonary Rehabilitation.

## 2022-06-02 ENCOUNTER — Ambulatory Visit
Admission: RE | Admit: 2022-06-02 | Discharge: 2022-06-02 | Disposition: A | Discharge status: Home / Self Care | Payer: Medicare Other | Source: Ambulatory Visit | Attending: Unknown Physician Specialty | Admitting: Unknown Physician Specialty

## 2022-06-02 ENCOUNTER — Encounter: Payer: Self-pay | Admitting: Oncology

## 2022-06-02 DIAGNOSIS — J329 Chronic sinusitis, unspecified: Secondary | ICD-10-CM

## 2022-06-02 DIAGNOSIS — J341 Cyst and mucocele of nose and nasal sinus: Secondary | ICD-10-CM

## 2022-06-03 ENCOUNTER — Encounter: Payer: Self-pay | Admitting: Oncology

## 2022-06-10 ENCOUNTER — Inpatient Hospital Stay: Payer: Medicare Other | Attending: Nurse Practitioner

## 2022-06-10 DIAGNOSIS — Z452 Encounter for adjustment and management of vascular access device: Secondary | ICD-10-CM | POA: Diagnosis present

## 2022-06-10 DIAGNOSIS — Z8572 Personal history of non-Hodgkin lymphomas: Secondary | ICD-10-CM | POA: Diagnosis present

## 2022-06-10 DIAGNOSIS — Z95828 Presence of other vascular implants and grafts: Secondary | ICD-10-CM

## 2022-06-10 MED ORDER — HEPARIN SOD (PORK) LOCK FLUSH 100 UNIT/ML IV SOLN
500.0000 [IU] | Freq: Once | INTRAVENOUS | Status: AC
  Administered 2022-06-10: 500 [IU] via INTRAVENOUS
  Filled 2022-06-10: qty 5

## 2022-06-10 MED ORDER — SODIUM CHLORIDE 0.9% FLUSH
10.0000 mL | Freq: Once | INTRAVENOUS | Status: AC
  Administered 2022-06-10: 10 mL via INTRAVENOUS
  Filled 2022-06-10: qty 10

## 2022-06-22 ENCOUNTER — Telehealth: Payer: Self-pay | Admitting: Oncology

## 2022-06-22 NOTE — Telephone Encounter (Signed)
Dr. Sanjuan Dame off office called requesting this patient have a sooner appointment- she was seen in his office yesterday and her white count is 1.4. Patient scheduled for MD visit Thursday at 9:30. Please advise if any other appointments need to be scheduled.   Thank you

## 2022-06-23 ENCOUNTER — Other Ambulatory Visit: Payer: Self-pay | Admitting: Oncology

## 2022-06-23 ENCOUNTER — Other Ambulatory Visit: Payer: Self-pay

## 2022-06-23 DIAGNOSIS — C8330 Diffuse large B-cell lymphoma, unspecified site: Secondary | ICD-10-CM

## 2022-06-23 DIAGNOSIS — D72819 Decreased white blood cell count, unspecified: Secondary | ICD-10-CM

## 2022-06-24 ENCOUNTER — Inpatient Hospital Stay: Payer: Medicare Other

## 2022-06-24 ENCOUNTER — Inpatient Hospital Stay: Payer: Medicare Other | Attending: Nurse Practitioner | Admitting: Oncology

## 2022-06-24 ENCOUNTER — Encounter: Payer: Self-pay | Admitting: Oncology

## 2022-06-24 VITALS — BP 118/80 | HR 66 | Temp 97.6°F | Resp 17 | Wt 147.9 lb

## 2022-06-24 DIAGNOSIS — D709 Neutropenia, unspecified: Secondary | ICD-10-CM | POA: Diagnosis not present

## 2022-06-24 DIAGNOSIS — D72819 Decreased white blood cell count, unspecified: Secondary | ICD-10-CM

## 2022-06-24 DIAGNOSIS — N289 Disorder of kidney and ureter, unspecified: Secondary | ICD-10-CM | POA: Insufficient documentation

## 2022-06-24 DIAGNOSIS — Z8572 Personal history of non-Hodgkin lymphomas: Secondary | ICD-10-CM | POA: Diagnosis present

## 2022-06-24 DIAGNOSIS — Z853 Personal history of malignant neoplasm of breast: Secondary | ICD-10-CM | POA: Diagnosis present

## 2022-06-24 DIAGNOSIS — C8330 Diffuse large B-cell lymphoma, unspecified site: Secondary | ICD-10-CM

## 2022-06-24 DIAGNOSIS — D696 Thrombocytopenia, unspecified: Secondary | ICD-10-CM | POA: Diagnosis not present

## 2022-06-24 LAB — CBC WITH DIFFERENTIAL/PLATELET
Abs Immature Granulocytes: 0 10*3/uL (ref 0.00–0.07)
Basophils Absolute: 0 10*3/uL (ref 0.0–0.1)
Basophils Relative: 2 %
Eosinophils Absolute: 0.1 10*3/uL (ref 0.0–0.5)
Eosinophils Relative: 6 %
HCT: 37.9 % (ref 36.0–46.0)
Hemoglobin: 12.4 g/dL (ref 12.0–15.0)
Immature Granulocytes: 0 %
Lymphocytes Relative: 73 %
Lymphs Abs: 0.6 10*3/uL — ABNORMAL LOW (ref 0.7–4.0)
MCH: 30 pg (ref 26.0–34.0)
MCHC: 32.7 g/dL (ref 30.0–36.0)
MCV: 91.5 fL (ref 80.0–100.0)
Monocytes Absolute: 0.1 10*3/uL (ref 0.1–1.0)
Monocytes Relative: 15 %
Neutro Abs: 0 10*3/uL — CL (ref 1.7–7.7)
Neutrophils Relative %: 4 %
Platelets: 118 10*3/uL — ABNORMAL LOW (ref 150–400)
RBC: 4.14 MIL/uL (ref 3.87–5.11)
RDW: 13.8 % (ref 11.5–15.5)
Smear Review: NORMAL
WBC: 0.9 10*3/uL — CL (ref 4.0–10.5)
nRBC: 0 % (ref 0.0–0.2)

## 2022-06-24 LAB — VITAMIN B12: Vitamin B-12: 644 pg/mL (ref 180–914)

## 2022-06-24 LAB — FOLATE: Folate: 13.5 ng/mL (ref >5.9)

## 2022-06-24 NOTE — Progress Notes (Signed)
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  INTERVAL HISTORY: Patient returns to clinic today as an add-on after laboratory work noted a declining total white blood cell count.  Patient reports that she has been on various antibiotics for several weeks secondary to a sinus infection as well as diverticulitis.  She otherwise feels well.  She is back to work 30 hours/week.  She has no neurologic complaints.  She denies any recent fevers or illnesses.  She denies any night sweats or weight loss.  She denies any chest pain, cough, shortness of breath, or hemoptysis.  She denies any nausea, vomiting, constipation, or diarrhea. She has no urinary complaints.  Patient offers no further specific complaints today.  REVIEW OF SYSTEMS:   Review of Systems  Constitutional: Negative.  Negative for chills, diaphoresis, fever, malaise/fatigue and weight loss.  Respiratory: Negative.  Negative for cough and shortness of breath.   Cardiovascular: Negative.  Negative for chest pain and leg swelling.  Gastrointestinal: Negative.  Negative for abdominal pain.  Genitourinary: Negative.  Negative for dysuria.  Musculoskeletal: Negative.  Negative for back pain.  Skin: Negative.  Negative for rash.  Neurological: Negative.  Negative for dizziness, sensory change, focal weakness, weakness and headaches.  Psychiatric/Behavioral: Negative.  The patient is not nervous/anxious.     As per HPI. Otherwise, a complete review of systems is negative.  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 MammaPrint was reported as low risk, therefore she did not require adjuvant chemotherapy. She completed adjuvant XRT. Will complete 5 year of letrozole in July 2023.   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  Patient's most recent CT scan on April 28, 2018 revealed no evidence of progressive or recurrent disease.    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)   Primary cancer of upper outer quadrant of left female breast  05/09/2016 Initial Diagnosis   Primary cancer of upper outer quadrant of left female breast ([place])   DLBCL (diffuse large B cell lymphoma)  09/10/2021 Initial Diagnosis   DLBCL (diffuse large B cell lymphoma) ([place])   09/17/2021 - 10/30/2021 Chemotherapy   Patient is on Treatment Plan : NON-HODGKINS LYMPHOMA R-CHOP q21d     09/17/2021 - 12/18/2021 Chemotherapy   Patient is on Treatment Plan : NON-HODGKINS LYMPHOMA R-CHOP q21d     09/29/2021 Cancer Staging   Staging form: Hodgkin and Non-Hodgkin Lymphoma, AJCC 8th Edition - Clinical stage from 09/29/2021: Stage III (Diffuse large B-cell lymphoma) - Signed by Lloyd Huger, MD on 09/29/2021 Stage prefix: Initial diagnosis     PAST MEDICAL HISTORY: Past Medical History:  Diagnosis Date   Abdominal pain, left lower quadrant    epiploic appendagitis   Anginal pain    Arthritis 06/06/2018   knee   Asthma    mild   Atrial fibrillation    Breast cancer    Coronary artery disease 06/06/2018  1 artery 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 Mamie Nick. at [facility] clinic in [place]   Depression    Depression 12/20/2021   Diabetes mellitus without complication    Diabetes mellitus without complication    Dyspnea    Dysrhythmia    GERD (gastroesophageal reflux disease)     Hypertension    Hypothyroidism    MALT (mucosa associated lymphoid tissue) 07/05/2014   Right neck mass resected 01/2014.   No pertinent past medical history    Pancytopenia    Personal history of radiation therapy    Preoperative cardiovascular examination 05/13/2016   Sleep apnea 06/06/2018   O2- 2l at bedtime and BiPap @ bedtime    PAST SURGICAL HISTORY: Past Surgical History:  Procedure Laterality Date   BREAST BIOPSY Left 2/136/2018   INVASIVE MAMMARY CARCINOMA   BREAST BIOPSY Right 05/25/2016   FIBROCYSTIC CHANGE WITH CALCIFICATIONS    BREAST BIOPSY Right 05/29/2020   Affirm bx-"X" clip-FIBROADENOMA WITH ASSOCIATED CALCIFICATIONS. - NEGATIVE   BREAST EXCISIONAL BIOPSY Left 06/04/2016   INVASIVE MAMMARY CARCINOMA.    BREAST LUMPECTOMY Left 06/04/2016   INVASIVE MAMMARY CARCINOMA.    CARDIAC CATHETERIZATION     CARDIAC CATHETERIZATION N/A 09/24/2015   Procedure: Left Heart Cath and Coronary Angiography;  Surgeon: Isaias Cowman, MD;  Location: [facility];  Service: Cardiovascular;  Laterality: N/A;   CARDIAC CATHETERIZATION N/A 09/24/2015   Procedure: Coronary Stent Intervention;  Surgeon: Isaias Cowman, MD;  Location: [facility];  Service: Cardiovascular;  Laterality: N/A;   CHOLECYSTECTOMY  11/26   08/1970   COLONOSCOPY WITH PROPOFOL N/A 07/08/2021   Procedure: COLONOSCOPY WITH PROPOFOL;  Surgeon: Toledo, Benay Pike, MD;  Location: [facility];  Service: Gastroenterology;  Laterality: N/A;   DILATATION & CURETTAGE/HYSTEROSCOPY WITH MYOSURE N/A 05/05/2015   Procedure: Hysteroscopy and endometrial curretage;  Surgeon: Boykin Nearing, MD;  Location: [facility];  Service: Gynecology;  Laterality: N/A;   DILATION AND CURETTAGE OF UTERUS     EXCISION MASS FROM NECK  Right    Dr. Tami Ribas   EYE SURGERY  05/29/2020   bil cataract 9/08   HAMMER TOE SURGERY Right    IR BONE MARROW BIOPSY & ASPIRATION  09/09/2021   IR IMAGING GUIDED PORT  INSERTION  09/09/2021   JOINT REPLACEMENT Bilateral 2008, 11/26/ 2012   Partial Knee Replacements   LEFT HEART CATH AND CORONARY ANGIOGRAPHY N/A 10/25/2017   Procedure: LEFT HEART CATH AND CORONARY ANGIOGRAPHY;  Surgeon: Teodoro Spray, MD;  Location: [facility];  Service: Cardiovascular;  Laterality: N/A;   LEFT HEART CATH AND CORONARY ANGIOGRAPHY N/A 10/26/2017   Procedure: LEFT HEART CATH AND CORONARY ANGIOGRAPHY;  Surgeon: Isaias Cowman, MD;  Location: [facility];  Service: Cardiovascular;  Laterality: N/A;   PARTIAL MASTECTOMY WITH NEEDLE LOCALIZATION Left 06/04/2016   Procedure: PARTIAL MASTECTOMY WITH NEEDLE LOCALIZATION;  Surgeon: Leonie Green, MD;  Location: [facility];  Service: General;  Laterality: Left;   SCLERAL BUCKLE  02/16/2011   Procedure: SCLERAL BUCKLE;  Surgeon: Hayden Pedro, MD;  Location: [facility];  Service: Ophthalmology;  Laterality: Left;  Scleral Buckle Left Eye with Headscope Laser   SENTINEL NODE BIOPSY Left 06/04/2016   Procedure: SENTINEL NODE BIOPSY;  Surgeon: Leonie Green, MD;  Location: [facility];  Service: General;  Laterality: Left;    FAMILY HISTORY Family History  Problem Relation Age of Onset   Breast cancer Mother 74   Heart disease Father    Breast cancer  Paternal Aunt 62   Breast cancer Cousin    Anesthesia problems Neg Hx    Hypotension Neg Hx    Malignant hyperthermia Neg Hx    Pseudochol deficiency Neg Hx     GYNECOLOGIC HISTORY:  No LMP recorded. Patient is postmenopausal.     ADVANCED DIRECTIVES:    HEALTH MAINTENANCE: Social History   Tobacco Use   Smoking status: Never   Smokeless tobacco: Never  Vaping Use   Vaping Use: Never used  Substance Use Topics   Alcohol use: No   Drug use: No     Colonoscopy:  PAP:  Bone density:  Mammogram: November 2016  Allergies  Allergen Reactions   Codeine Other (See Comments)    Stroke like symptoms   Tramadol Itching    Current Outpatient  Medications  Medication Sig Dispense Refill   acetaminophen (TYLENOL) 500 MG tablet Take 500 mg by mouth every 4 (four) hours as needed.     apixaban (ELIQUIS) 5 MG TABS tablet Take 1 tablet (5 mg total) by mouth 2 (two) times daily. HOLD till you get the Blood work done on 12/10/21 and d/w Dr Grayland Ormond to see if you can restart it 60 tablet 1   atorvastatin (LIPITOR) 40 MG tablet Take 1 tablet (40 mg total) by mouth at bedtime.     Calcium Carbonate (CALCIUM 500 PO) Take by mouth.     cyanocobalamin (VITAMIN B12) 1000 MCG tablet Take 1,000 mcg by mouth daily.     ferrous sulfate 325 (65 FE) MG tablet Take 325 mg by mouth daily with breakfast.     Fluticasone-Salmeterol (ADVAIR) 100-50 MCG/DOSE AEPB Inhale 1 puff into the lungs 2 (two) times daily as needed (for asthma.).     levothyroxine (SYNTHROID) 125 MCG tablet Take 125 mcg by mouth daily.     magnesium oxide (MAG-OX) 400 (240 Mg) MG tablet Take 1 tablet (400 mg total) by mouth 2 (two) times daily. 60 tablet 0   nitroGLYCERIN (NITROSTAT) 0.4 MG SL tablet Place 0.4 mg under the tongue every 5 (five) minutes as needed for chest pain. Maximum 3 doses, If no relief call md or 911.     ondansetron (ZOFRAN) 8 MG tablet Take by mouth every 8 (eight) hours as needed for nausea or vomiting.     RABEprazole (ACIPHEX) 20 MG tablet Take 20 mg by mouth daily.     sertraline (ZOLOFT) 50 MG tablet Take 50 mg by mouth at bedtime.     sotalol (BETAPACE) 80 MG tablet Take 80 mg by mouth 2 (two) times daily.     feeding supplement, GLUCERNA SHAKE, (GLUCERNA SHAKE) LIQD Take 237 mLs by mouth 3 (three) times daily between meals. (Patient not taking: Reported on 02/15/2022) 237 mL 0   isosorbide mononitrate (IMDUR) 30 MG 24 hr tablet Take 30 mg by mouth daily. (Patient not taking: Reported on 02/15/2022)     Multiple Vitamin (MULTIVITAMIN WITH MINERALS) TABS tablet Take 1 tablet by mouth daily. (Patient not taking: Reported on 06/24/2022) 30 tablet 0   No current  facility-administered medications for this visit.   Facility-Administered Medications Ordered in Other Visits  Medication Dose Route Frequency Provider Last Rate Last Admin   acetaminophen (TYLENOL) 325 MG tablet            diphenhydrAMINE (BENADRYL) 25 mg capsule            heparin lock flush 100 UNIT/ML injection  heparin lock flush 100 UNIT/ML injection            palonosetron (ALOXI) 0.25 MG/5ML injection             OBJECTIVE: BP 118/80   Pulse 66   Temp 97.6 F (36.4 C) (Tympanic)   Resp 17   Wt 147 lb 14.4 oz (67.1 kg)   SpO2 100%   BMI 26.03 kg/m    Body mass index is 26.03 kg/m.    ECOG FS:1 - Symptomatic but completely ambulatory  General: Well-developed, well-nourished, no acute distress. Eyes: Pink conjunctiva, anicteric sclera. HEENT: Normocephalic, moist mucous membranes. Lungs: No audible wheezing or coughing. Heart: Regular rate and rhythm. Abdomen: Soft, nontender, no obvious distention. Musculoskeletal: No edema, cyanosis, or clubbing. Neuro: Alert, answering all questions appropriately. Cranial nerves grossly intact. Skin: No rashes or petechiae noted. Psych: Normal affect. Lymphatics: No palpable lymphadenopathy.  LAB RESULTS:  CBC    Component Value Date/Time   WBC 0.9 (LL) 06/24/2022 0917   RBC 4.14 06/24/2022 0917   HGB 12.4 06/24/2022 0917   HGB 13.3 07/03/2014 1503   HCT 37.9 06/24/2022 0917   HCT 40.8 07/03/2014 1503   PLT 118 (L) 06/24/2022 0917   PLT 240 07/03/2014 1503   MCV 91.5 06/24/2022 0917   MCV 80 07/03/2014 1503   MCH 30.0 06/24/2022 0917   MCHC 32.7 06/24/2022 0917   RDW 13.8 06/24/2022 0917   RDW 15.3 (H) 07/03/2014 1503   LYMPHSABS 0.6 (L) 06/24/2022 0917   LYMPHSABS 2.8 07/03/2014 1503   MONOABS 0.1 06/24/2022 0917   MONOABS 0.5 07/03/2014 1503   EOSABS 0.1 06/24/2022 0917   EOSABS 0.3 07/03/2014 1503   BASOSABS 0.0 06/24/2022 0917   BASOSABS 0.1 07/03/2014 1503     STUDIES: No results  found.  ASSESSMENT: Stage III diffuse large B-cell lymphoma.  PLAN:    Stage III diffuse large B-cell lymphoma: PET scan and biopsy results reviewed independently.  Case discussed with pathology confirming diagnosis and not a recurrence of patient's MALT lymphoma.  Bone marrow biopsy did not reveal any evidence of lymphoma.  Repeat PET scan results from December 09, 2021 after 4 cycles of R-CHOP reviewed independently with complete metabolic response of patient's lymphoma.  Patient then received 1 dose of R-CVP on December 17, 2021, but given significant toxicity chemotherapy was subsequently discontinued permanently.  No further treatments are planned at this time.  Repeat CT scan on March 31, 2022 reviewed independently and reported as above with no obvious evidence of recurrent disease.  Given her declining white blood cell count, will repeat CT scan in the next 1 to 2 weeks. 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 MammaPrint was reported as low risk, therefore she did not require adjuvant chemotherapy. She completed adjuvant XRT.  Patient completed 5 years of letrozole in June 2023.  Her most recent mammogram on May 24, 2022 was reported as BI-RADS 1.  Repeat in March 2025. 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  Case discussed with pathology.  This is a different primary.  Imaging as above. Left renal lesion: Stable, consistent with a benign cyst.   Bone mineral density: Patient's most recent bone mineral density on May 24, 2022 revealed a T-score of -2.4 which is worse than 2 years  prior where her T-score was reported -2.0. Continue calcium and vitamin D supplementation.   Anemia: Resolved.   Neutropenia: Significantly worse.  Possibly related to  antibiotics.  Will repeat CT scan as above in the next 1 to 2 weeks.  Patient will then return to clinic in 4 weeks which will be approximately 1 week after completing her antibiotic treatment for repeat laboratory work.  If her white blood cell count remains persistently low, will consider bone marrow biopsy at that time. Thrombocytopenia: Chronic and unchanged.  Patient's platelet count is 118 today.    Patient expressed understanding and was in agreement with this plan. She also understands that She can call clinic at any time with any questions, concerns, or complaints.     Lloyd Huger, MD   06/24/2022 10:40 AM

## 2022-06-24 NOTE — Progress Notes (Signed)
Patient is currently on two antibiotics ( she can not recall the names) for diverticulitis and a sinus infection.   She is concerned with frequent urination throughout the night, and refuses to wear her C-pap.   She is also experiencing neuropathy in both feet that is causing her to loose her balance.   Patient would also like a referral to a dietician, per daughter she is not eating or keeping food in her house.

## 2022-06-28 LAB — COMP PANEL: LEUKEMIA/LYMPHOMA

## 2022-06-28 LAB — PROTEIN ELECTROPHORESIS, SERUM
A/G Ratio: 1.1 (ref 0.7–1.7)
Albumin ELP: 3.2 g/dL (ref 2.9–4.4)
Alpha-1-Globulin: 0.3 g/dL (ref 0.0–0.4)
Alpha-2-Globulin: 0.9 g/dL (ref 0.4–1.0)
Beta Globulin: 1.1 g/dL (ref 0.7–1.3)
Gamma Globulin: 0.5 g/dL (ref 0.4–1.8)
Globulin, Total: 2.8 g/dL (ref 2.2–3.9)
Total Protein ELP: 6 g/dL (ref 6.0–8.5)

## 2022-07-01 ENCOUNTER — Other Ambulatory Visit: Payer: Self-pay | Admitting: *Deleted

## 2022-07-01 DIAGNOSIS — C8330 Diffuse large B-cell lymphoma, unspecified site: Secondary | ICD-10-CM

## 2022-07-08 ENCOUNTER — Ambulatory Visit: Payer: Medicare Other

## 2022-07-08 ENCOUNTER — Ambulatory Visit
Admission: RE | Admit: 2022-07-08 | Discharge: 2022-07-08 | Disposition: A | Discharge status: Home / Self Care | Payer: Medicare Other | Source: Ambulatory Visit | Attending: Oncology | Admitting: Oncology

## 2022-07-08 DIAGNOSIS — C8330 Diffuse large B-cell lymphoma, unspecified site: Secondary | ICD-10-CM | POA: Diagnosis not present

## 2022-07-08 MED ORDER — HEPARIN SOD (PORK) LOCK FLUSH 100 UNIT/ML IV SOLN
INTRAVENOUS | Status: AC
  Filled 2022-07-08: qty 5

## 2022-07-08 MED ORDER — IOHEXOL 300 MG/ML  SOLN
100.0000 mL | Freq: Once | INTRAMUSCULAR | Status: AC | PRN
  Administered 2022-07-08: 100 mL via INTRAVENOUS

## 2022-07-08 MED ORDER — HEPARIN SOD (PORK) LOCK FLUSH 100 UNIT/ML IV SOLN
500.0000 [IU] | Freq: Once | INTRAVENOUS | Status: AC
  Administered 2022-07-08: 500 [IU] via INTRAVENOUS

## 2022-07-08 MED ORDER — BARIUM SULFATE 2 % PO SUSP: 900.0000 mL | Freq: Once | ORAL | Status: DC

## 2022-07-09 ENCOUNTER — Encounter (INDEPENDENT_AMBULATORY_CARE_PROVIDER_SITE_OTHER): Payer: Medicare Other | Admitting: Ophthalmology

## 2022-07-09 DIAGNOSIS — H348312 Tributary (branch) retinal vein occlusion, right eye, stable: Secondary | ICD-10-CM

## 2022-07-09 DIAGNOSIS — H338 Other retinal detachments: Secondary | ICD-10-CM

## 2022-07-09 DIAGNOSIS — H34812 Central retinal vein occlusion, left eye, with macular edema: Secondary | ICD-10-CM

## 2022-07-09 DIAGNOSIS — I10 Essential (primary) hypertension: Secondary | ICD-10-CM

## 2022-07-09 DIAGNOSIS — H43813 Vitreous degeneration, bilateral: Secondary | ICD-10-CM

## 2022-07-09 DIAGNOSIS — H35033 Hypertensive retinopathy, bilateral: Secondary | ICD-10-CM

## 2022-07-12 LAB — INTELLIGEN MYELOID

## 2022-07-27 ENCOUNTER — Inpatient Hospital Stay (HOSPITAL_BASED_OUTPATIENT_CLINIC_OR_DEPARTMENT_OTHER): Payer: Medicare Other | Admitting: Oncology

## 2022-07-27 ENCOUNTER — Inpatient Hospital Stay: Payer: Medicare Other | Attending: Nurse Practitioner

## 2022-07-27 ENCOUNTER — Encounter: Payer: Self-pay | Admitting: Oncology

## 2022-07-27 VITALS — BP 122/86 | HR 68 | Temp 98.3°F | Resp 16 | Ht 63.2 in | Wt 145.0 lb

## 2022-07-27 DIAGNOSIS — Z95828 Presence of other vascular implants and grafts: Secondary | ICD-10-CM

## 2022-07-27 DIAGNOSIS — Z8572 Personal history of non-Hodgkin lymphomas: Secondary | ICD-10-CM | POA: Insufficient documentation

## 2022-07-27 DIAGNOSIS — Z853 Personal history of malignant neoplasm of breast: Secondary | ICD-10-CM | POA: Insufficient documentation

## 2022-07-27 DIAGNOSIS — C8330 Diffuse large B-cell lymphoma, unspecified site: Secondary | ICD-10-CM | POA: Diagnosis not present

## 2022-07-27 DIAGNOSIS — D72819 Decreased white blood cell count, unspecified: Secondary | ICD-10-CM

## 2022-07-27 LAB — CBC WITH DIFFERENTIAL/PLATELET
Abs Immature Granulocytes: 0.03 10*3/uL (ref 0.00–0.07)
Basophils Absolute: 0 10*3/uL (ref 0.0–0.1)
Basophils Relative: 1 %
Eosinophils Absolute: 0.1 10*3/uL (ref 0.0–0.5)
Eosinophils Relative: 2 %
HCT: 38.2 % (ref 36.0–46.0)
Hemoglobin: 12.5 g/dL (ref 12.0–15.0)
Immature Granulocytes: 1 %
Lymphocytes Relative: 18 %
Lymphs Abs: 1.1 10*3/uL (ref 0.7–4.0)
MCH: 29.6 pg (ref 26.0–34.0)
MCHC: 32.7 g/dL (ref 30.0–36.0)
MCV: 90.3 fL (ref 80.0–100.0)
Monocytes Absolute: 0.4 10*3/uL (ref 0.1–1.0)
Monocytes Relative: 6 %
Neutro Abs: 4.7 10*3/uL (ref 1.7–7.7)
Neutrophils Relative %: 72 %
Platelets: 133 10*3/uL — ABNORMAL LOW (ref 150–400)
RBC: 4.23 MIL/uL (ref 3.87–5.11)
RDW: 14.9 % (ref 11.5–15.5)
WBC: 6.5 10*3/uL (ref 4.0–10.5)
nRBC: 0 % (ref 0.0–0.2)

## 2022-07-27 LAB — NEUTROPHIL AB TEST LEVEL 1: NEUTROPHIL SCR/PANEL INTERP.: NEGATIVE

## 2022-07-27 MED ORDER — HEPARIN SOD (PORK) LOCK FLUSH 100 UNIT/ML IV SOLN
500.0000 [IU] | Freq: Once | INTRAVENOUS | Status: AC
  Administered 2022-07-27: 500 [IU] via INTRAVENOUS
  Filled 2022-07-27: qty 5

## 2022-07-27 MED ORDER — SODIUM CHLORIDE 0.9% FLUSH
10.0000 mL | Freq: Once | INTRAVENOUS | Status: AC
  Administered 2022-07-27: 10 mL via INTRAVENOUS
  Filled 2022-07-27: qty 10

## 2022-07-27 NOTE — Progress Notes (Signed)
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  INTERVAL HISTORY: Patient returns to clinic today for repeat laboratory work, discussion of her imaging results, and further evaluation.  She currently feels well and is asymptomatic.  She is no longer taking antibiotics.  She continues to be active and works full-time.  She has no neurologic complaints.  She denies any recent fevers or illnesses.  She denies any night sweats or weight loss.  She denies any chest pain, cough, shortness of breath, or hemoptysis.  She denies any nausea, vomiting, constipation, or diarrhea. She has no urinary complaints.  Patient offers no specific complaints today.  REVIEW OF SYSTEMS:   Review of Systems  Constitutional: Negative.  Negative for chills, diaphoresis, fever, malaise/fatigue and weight loss.  Respiratory: Negative.  Negative for cough and shortness of breath.   Cardiovascular: Negative.  Negative for chest pain and leg swelling.  Gastrointestinal: Negative.  Negative for abdominal pain.  Genitourinary: Negative.  Negative for dysuria.  Musculoskeletal: Negative.  Negative for back pain.  Skin: Negative.  Negative for rash.  Neurological: Negative.  Negative for dizziness, sensory change, focal weakness, weakness and headaches.  Psychiatric/Behavioral: Negative.  The patient is not nervous/anxious.     As per HPI. Otherwise, a complete review of systems is negative.  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 MammaPrint was reported as low risk, therefore she did not require adjuvant chemotherapy. She completed adjuvant XRT. Will complete 5 year of letrozole in July 2023.   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  Patient's most recent CT scan on April 28, 2018 revealed no evidence of progressive or recurrent disease.    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 Diagnosis   MALT (mucosa associated lymphoid tissue)   Primary cancer of upper outer quadrant of left female breast (HCC)  05/09/2016 Initial Diagnosis   Primary cancer of upper outer quadrant of left female breast (HCC)   DLBCL (diffuse large B cell lymphoma) (HCC)  09/10/2021 Initial Diagnosis   DLBCL (diffuse large B cell lymphoma) (HCC)   09/17/2021 - 10/30/2021 Chemotherapy   Patient is on Treatment Plan : NON-HODGKINS LYMPHOMA R-CHOP q21d     09/17/2021 - 12/18/2021 Chemotherapy   Patient is on Treatment Plan : NON-HODGKINS LYMPHOMA R-CHOP q21d     09/29/2021 Cancer Staging   Staging form: Hodgkin and Non-Hodgkin Lymphoma, AJCC 8th Edition - Clinical stage from 09/29/2021: Stage III (Diffuse large B-cell lymphoma) - Signed by Jeralyn Ruths, MD on 09/29/2021 Stage prefix: Initial diagnosis     PAST MEDICAL HISTORY: Past Medical History:  Diagnosis Date   Abdominal pain, left lower quadrant    epiploic appendagitis   Anginal pain (HCC)    Arthritis 06/06/2018   knee   Asthma    mild   Atrial fibrillation (HCC)    Breast cancer (HCC)    Coronary artery disease 06/06/2018   1 artery 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 Demetrius Charity.  at [facility] in [place]   Depression    Depression 12/20/2021   Diabetes mellitus without complication (HCC)    Diabetes mellitus without complication (HCC)    Dyspnea    Dysrhythmia    GERD (gastroesophageal reflux disease)    Hypertension     Hypothyroidism    MALT (mucosa associated lymphoid tissue) (HCC) 07/05/2014   Right neck mass resected 01/2014.   No pertinent past medical history    Pancytopenia (HCC)    Personal history of radiation therapy    Preoperative cardiovascular examination 05/13/2016   Sleep apnea 06/06/2018   O2- 2l at bedtime and BiPap @ bedtime    PAST SURGICAL HISTORY: Past Surgical History:  Procedure Laterality Date   BREAST BIOPSY Left 2/136/2018   INVASIVE MAMMARY CARCINOMA   BREAST BIOPSY Right 05/25/2016   FIBROCYSTIC CHANGE WITH CALCIFICATIONS    BREAST BIOPSY Right 05/29/2020   Affirm bx-"X" clip-FIBROADENOMA WITH ASSOCIATED CALCIFICATIONS. - NEGATIVE   BREAST EXCISIONAL BIOPSY Left 06/04/2016   INVASIVE MAMMARY CARCINOMA.    BREAST LUMPECTOMY Left 06/04/2016   INVASIVE MAMMARY CARCINOMA.    CARDIAC CATHETERIZATION     CARDIAC CATHETERIZATION N/A 09/24/2015   Procedure: Left Heart Cath and Coronary Angiography;  Surgeon: Marcina Millard, MD;  Location: [facility] INVASIVE [facility];  Service: Cardiovascular;  Laterality: N/A;   CARDIAC CATHETERIZATION N/A 09/24/2015   Procedure: Coronary Stent Intervention;  Surgeon: Marcina Millard, MD;  Location: [facility] IN[facility] CV [facility];  Service: Cardiovascular;  Laterality: N/A;   CHOLECYSTECTOMY  11/26   08/1970   COLONOSCOPY WITH PROPOFOL N/A 07/08/2021   Procedure: COLONOSCOPY WITH PROPOFOL;  Surgeon: Toledo, Boykin Nearing, MD;  Location: [facility] ENDOS[facility];  Service: Gastroenterology;  Laterality: N/A;   DILATATION & CURETTAGE/HYSTEROSCOPY WITH MYOSURE N/A 05/05/2015   Procedure: Hysteroscopy and endometrial curretage;  Surgeon: Suzy Bouchard, MD;  Location: [facility] ORS;  Service: Gynecology;  Laterality: N/A;   DILATION AND CURETTAGE OF UTERUS     EXCISION MASS FROM NECK  Right    Dr. Jenne Campus   EYE SURGERY  05/29/2020   bil cataract 9/08   HAMMER TOE SURGERY Right    IR BONE MARROW BIOPSY & ASPIRATION  09/09/2021   IR IMAGING GUIDED PORT INSERTION   09/09/2021   JOINT REPLACEMENT Bilateral 2008, 11/26/ 2012   Partial Knee Replacements   LEFT HEART CATH AND CORONARY ANGIOGRAPHY N/A 10/25/2017   Procedure: LEFT HEART CATH AND CORONARY ANGIOGRAPHY;  Surgeon: Dalia Heading, MD;  Location: [facility];  Service: Cardiovascular;  Laterality: N/A;   LEFT HEART CATH AND CORONARY ANGIOGRAPHY N/A 10/26/2017   Procedure: LEFT HEART CATH AND CORONARY ANGIOGRAPHY;  Surgeon: Marcina Millard, MD;  Location: [facility];  Service: Cardiovascular;  Laterality: N/A;   PARTIAL MASTECTOMY WITH NEEDLE LOCALIZATION Left 06/04/2016   Procedure: PARTIAL MASTECTOMY WITH NEEDLE LOCALIZATION;  Surgeon: Nadeen Landau, MD;  Location: [facility] OR[facility];  Service: General;  Laterality: Left;   SCLERAL BUCKLE  02/16/2011   Procedure: SCLERAL BUCKLE;  Surgeon: Sherrie George, MD;  Location: [facility];  Service: Ophthalmology;  Laterality: Left;  Scleral Buckle Left Eye with Headscope Laser   SENTINEL NODE BIOPSY Left 06/04/2016   Procedure: SENTINEL NODE BIOPSY;  Surgeon: Nadeen Landau, MD;  Location: [facility] OR[facility];  Service: General;  Laterality: Left;    FAMILY HISTORY Family History  Problem Relation Age of Onset   Breast cancer Mother 12   Heart disease Father    Breast cancer Paternal Aunt 75  Breast cancer Cousin    Anesthesia problems Neg Hx    Hypotension Neg Hx    Malignant hyperthermia Neg Hx    Pseudochol deficiency Neg Hx     GYNECOLOGIC HISTORY:  No LMP recorded. Patient is postmenopausal.     ADVANCED DIRECTIVES:    HEALTH MAINTENANCE: Social History   Tobacco Use   Smoking status: Never   Smokeless tobacco: Never  Vaping Use   Vaping Use: Never used  Substance Use Topics   Alcohol use: No   Drug use: No     Colonoscopy:  PAP:  Bone density:  Mammogram: November 2016  Allergies  Allergen Reactions   Codeine Other (See Comments)    Stroke like symptoms   Tramadol Itching    Current Outpatient  Medications  Medication Sig Dispense Refill   acetaminophen (TYLENOL) 500 MG tablet Take 500 mg by mouth every 4 (four) hours as needed.     apixaban (ELIQUIS) 5 MG TABS tablet Take 1 tablet (5 mg total) by mouth 2 (two) times daily. HOLD till you get the Blood work done on 12/10/21 and d/w Dr Orlie Dakin to see if you can restart it 60 tablet 1   atorvastatin (LIPITOR) 40 MG tablet Take 1 tablet (40 mg total) by mouth at bedtime.     Calcium Carbonate (CALCIUM 500 PO) Take by mouth.     cyanocobalamin (VITAMIN B12) 1000 MCG tablet Take 1,000 mcg by mouth daily.     ferrous sulfate 325 (65 FE) MG tablet Take 325 mg by mouth daily with breakfast.     Fluticasone-Salmeterol (ADVAIR) 100-50 MCG/DOSE AEPB Inhale 1 puff into the lungs 2 (two) times daily as needed (for asthma.).     levothyroxine (SYNTHROID) 125 MCG tablet Take 125 mcg by mouth daily.     magnesium oxide (MAG-OX) 400 (240 Mg) MG tablet Take 1 tablet (400 mg total) by mouth 2 (two) times daily. 60 tablet 0   nitroGLYCERIN (NITROSTAT) 0.4 MG SL tablet Place 0.4 mg under the tongue every 5 (five) minutes as needed for chest pain. Maximum 3 doses, If no relief call md or 911.     ondansetron (ZOFRAN) 8 MG tablet Take by mouth every 8 (eight) hours as needed for nausea or vomiting.     potassium chloride (KLOR-CON M) 10 MEQ tablet Take 1 tablet by mouth 2 (two) times daily.     RABEprazole (ACIPHEX) 20 MG tablet Take 20 mg by mouth daily.     sertraline (ZOLOFT) 50 MG tablet Take 50 mg by mouth at bedtime.     sotalol (BETAPACE) 80 MG tablet Take 80 mg by mouth 2 (two) times daily.     feeding supplement, GLUCERNA SHAKE, (GLUCERNA SHAKE) LIQD Take 237 mLs by mouth 3 (three) times daily between meals. (Patient not taking: Reported on 02/15/2022) 237 mL 0   isosorbide mononitrate (IMDUR) 30 MG 24 hr tablet Take 30 mg by mouth daily. (Patient not taking: Reported on 02/15/2022)     Multiple Vitamin (MULTIVITAMIN WITH MINERALS) TABS tablet Take 1  tablet by mouth daily. (Patient not taking: Reported on 06/24/2022) 30 tablet 0   No current facility-administered medications for this visit.   Facility-Administered Medications Ordered in Other Visits  Medication Dose Route Frequency Provider Last Rate Last Admin   acetaminophen (TYLENOL) 325 MG tablet            diphenhydrAMINE (BENADRYL) 25 mg capsule            heparin lock  flush 100 UNIT/ML injection            heparin lock flush 100 UNIT/ML injection            palonosetron (ALOXI) 0.25 MG/5ML injection             OBJECTIVE: BP 122/86 (BP Location: Right Arm, Patient Position: Sitting, Cuff Size: Normal)   Pulse 68   Temp 98.3 F (36.8 C) (Tympanic)   Resp 16   Ht 5' 3.2" (1.605 m)   Wt 145 lb (65.8 kg)   SpO2 99%   BMI 25.52 kg/m    Body mass index is 25.52 kg/m.    ECOG FS:0 - Asymptomatic  General: Well-developed, well-nourished, no acute distress. Eyes: Pink conjunctiva, anicteric sclera. HEENT: Normocephalic, moist mucous membranes. Lungs: No audible wheezing or coughing. Heart: Regular rate and rhythm. Abdomen: Soft, nontender, no obvious distention. Musculoskeletal: No edema, cyanosis, or clubbing. Neuro: Alert, answering all questions appropriately. Cranial nerves grossly intact. Skin: No rashes or petechiae noted. Psych: Normal affect.  LAB RESULTS:  CBC    Component Value Date/Time   WBC 6.5 07/27/2022 0938   RBC 4.23 07/27/2022 0938   HGB 12.5 07/27/2022 0938   HGB 13.3 07/03/2014 1503   HCT 38.2 07/27/2022 0938   HCT 40.8 07/03/2014 1503   PLT 133 (L) 07/27/2022 0938   PLT 240 07/03/2014 1503   MCV 90.3 07/27/2022 0938   MCV 80 07/03/2014 1503   MCH 29.6 07/27/2022 0938   MCHC 32.7 07/27/2022 0938   RDW 14.9 07/27/2022 0938   RDW 15.3 (H) 07/03/2014 1503   LYMPHSABS 1.1 07/27/2022 0938   LYMPHSABS 2.8 07/03/2014 1503   MONOABS 0.4 07/27/2022 0938   MONOABS 0.5 07/03/2014 1503   EOSABS 0.1 07/27/2022 0938   EOSABS 0.3 07/03/2014 1503    BASOSABS 0.0 07/27/2022 0938   BASOSABS 0.1 07/03/2014 1503     STUDIES: No results found.  ASSESSMENT: Stage III diffuse large B-cell lymphoma.  PLAN:    Stage III diffuse large B-cell lymphoma: PET scan and biopsy results reviewed independently.  Case discussed with pathology confirming diagnosis and not a recurrence of patient's MALT lymphoma.  Bone marrow biopsy did not reveal any evidence of lymphoma.  Repeat PET scan results from December 09, 2021 after 4 cycles of R-CHOP reviewed independently with complete metabolic response of patient's lymphoma.  Patient then received 1 dose of R-CVP on December 17, 2021, but given significant toxicity chemotherapy was subsequently discontinued permanently.  No further treatments are planned at this time.  Her most recent imaging with CT scan on July 08, 2022 reviewed independently and reported as above with no obvious evidence of recurrent or progressive disease.  Patient reports that she is moving in the next month, therefore no further follow-up has been scheduled. 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 MammaPrint was reported as low risk, therefore she did not require adjuvant chemotherapy. She completed adjuvant XRT.  Patient completed 5 years of letrozole in June 2023.  Her most recent mammogram on May 24, 2022 was reported as BI-RADS 1.  Repeat in March 2025. 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  Case discussed with pathology.  This is a different primary.  Imaging as above. Left renal lesion: Stable, consistent with a benign cyst.  Bone mineral density: Patient's most recent bone mineral density on May 24, 2022 revealed a T-score of -2.4 which is worse than 2 years prior where her T-score was reported  -2.0. Continue calcium and vitamin D supplementation.   Anemia: Resolved.   Neutropenia: Resolved.  Likely secondary to antibiotics. Thrombocytopenia: Nearly resolved.  Patient's most recent platelet count is 133.  Patient expressed understanding and was in agreement with this plan. She also understands that She can call clinic at any time with any questions, concerns, or complaints.     Jeralyn Ruths, MD   07/27/2022 12:41 PM

## 2022-07-28 ENCOUNTER — Other Ambulatory Visit: Payer: Self-pay | Admitting: *Deleted

## 2022-07-28 ENCOUNTER — Telehealth: Payer: Self-pay | Admitting: *Deleted

## 2022-07-28 DIAGNOSIS — Z95828 Presence of other vascular implants and grafts: Secondary | ICD-10-CM

## 2022-07-28 NOTE — Telephone Encounter (Signed)
Called and notified patient that order was placed with IR and they will call her to set up a date for removal.  Pt verbalized understanding.

## 2022-07-28 NOTE — Telephone Encounter (Signed)
Pt called requesting appointment be made for port a cath removal.

## 2022-07-29 NOTE — Telephone Encounter (Signed)
Has port removal on 5/16 arrive at 10:30a for an 11:00a appointment. Patient aware of appointment and expressed understanding.

## 2022-08-04 NOTE — Progress Notes (Signed)
Patient for IR Port Removal on Thurs 08/05/2022, I called and LVM for the patient on the phone and gave pre-procedure instructions. VM made pt aware to be here at 10:30a and [facility]. LVM 08/04/2022

## 2022-08-05 ENCOUNTER — Ambulatory Visit
Admission: RE | Admit: 2022-08-05 | Discharge: 2022-08-05 | Disposition: A | Discharge status: Home / Self Care | Payer: Medicare Other | Source: Ambulatory Visit | Attending: Oncology | Admitting: Oncology

## 2022-08-05 ENCOUNTER — Inpatient Hospital Stay: Payer: Medicare Other

## 2022-08-05 DIAGNOSIS — Z452 Encounter for adjustment and management of vascular access device: Secondary | ICD-10-CM | POA: Insufficient documentation

## 2022-08-05 DIAGNOSIS — Z95828 Presence of other vascular implants and grafts: Secondary | ICD-10-CM

## 2022-08-05 HISTORY — PX: IR REMOVAL TUN ACCESS W/ PORT W/O FL MOD SED: IMG2290

## 2022-08-05 MED ORDER — LIDOCAINE-EPINEPHRINE 1 %-1:100000 IJ SOLN
INTRAMUSCULAR | Status: AC
  Filled 2022-08-05: qty 1

## 2022-08-05 MED ORDER — LIDOCAINE HCL (PF) 1 % IJ SOLN
6.0000 mL | Freq: Once | INTRAMUSCULAR | Status: AC
  Administered 2022-08-05: 6 mL via INTRADERMAL

## 2022-08-30 ENCOUNTER — Encounter: Payer: Self-pay | Admitting: Oncology

## 2022-09-20 ENCOUNTER — Telehealth: Payer: Self-pay | Admitting: *Deleted

## 2022-09-20 ENCOUNTER — Encounter: Payer: Self-pay | Admitting: Oncology

## 2022-09-20 ENCOUNTER — Telehealth: Payer: Self-pay

## 2022-09-20 NOTE — Telephone Encounter (Signed)
Call from [facility] requesting that a referral be made to them to transfer her care as she has moved to [facility]. She requested a return call as well

## 2022-09-20 NOTE — Telephone Encounter (Signed)
Letter written, signed, and faxed over to Tia Alert at [facility] in [place] Fax # [phone] for confirmation of referral.

## 2022-09-22 ENCOUNTER — Encounter: Payer: Self-pay | Admitting: Oncology

## 2022-09-29 ENCOUNTER — Inpatient Hospital Stay: Payer: Medicare Other | Attending: Nurse Practitioner

## 2022-09-30 ENCOUNTER — Inpatient Hospital Stay: Payer: Medicare Other

## 2022-09-30 ENCOUNTER — Other Ambulatory Visit (HOSPITAL_COMMUNITY): Payer: Medicare Other

## 2022-09-30 ENCOUNTER — Other Ambulatory Visit: Payer: Medicare Other

## 2022-10-04 ENCOUNTER — Inpatient Hospital Stay: Payer: Medicare Other | Admitting: Oncology

## 2022-10-08 ENCOUNTER — Encounter (INDEPENDENT_AMBULATORY_CARE_PROVIDER_SITE_OTHER): Payer: Medicare Other | Admitting: Ophthalmology

## 2022-11-25 ENCOUNTER — Inpatient Hospital Stay: Payer: Medicare Other

## 2023-08-21 DEATH — deceased
